# Patient Record
Sex: Male | Born: 1940 | Race: White | Hispanic: No | Marital: Married | Smoking: Former smoker
Health system: Southern US, Community
[De-identification: ages and names within clinical notes are randomized; demographics above are authoritative.]

## PROBLEM LIST (undated history)

## (undated) DIAGNOSIS — Z950 Presence of cardiac pacemaker: Secondary | ICD-10-CM

## (undated) DIAGNOSIS — K449 Diaphragmatic hernia without obstruction or gangrene: Secondary | ICD-10-CM

## (undated) DIAGNOSIS — K219 Gastro-esophageal reflux disease without esophagitis: Secondary | ICD-10-CM

## (undated) DIAGNOSIS — G43909 Migraine, unspecified, not intractable, without status migrainosus: Secondary | ICD-10-CM

## (undated) DIAGNOSIS — R55 Syncope and collapse: Secondary | ICD-10-CM

## (undated) DIAGNOSIS — K5790 Diverticulosis of intestine, part unspecified, without perforation or abscess without bleeding: Secondary | ICD-10-CM

## (undated) DIAGNOSIS — K222 Esophageal obstruction: Secondary | ICD-10-CM

## (undated) DIAGNOSIS — E162 Hypoglycemia, unspecified: Secondary | ICD-10-CM

## (undated) DIAGNOSIS — E781 Pure hyperglyceridemia: Secondary | ICD-10-CM

## (undated) DIAGNOSIS — N2889 Other specified disorders of kidney and ureter: Secondary | ICD-10-CM

## (undated) DIAGNOSIS — I480 Paroxysmal atrial fibrillation: Secondary | ICD-10-CM

## (undated) DIAGNOSIS — R519 Headache, unspecified: Secondary | ICD-10-CM

## (undated) DIAGNOSIS — E669 Obesity, unspecified: Secondary | ICD-10-CM

## (undated) DIAGNOSIS — R51 Headache: Secondary | ICD-10-CM

## (undated) DIAGNOSIS — R6510 Systemic inflammatory response syndrome (SIRS) of non-infectious origin without acute organ dysfunction: Secondary | ICD-10-CM

## (undated) HISTORY — DX: Obesity, unspecified: E66.9

## (undated) HISTORY — DX: Hypoglycemia, unspecified: E16.2

## (undated) HISTORY — DX: Esophageal obstruction: K22.2

## (undated) HISTORY — DX: Diverticulosis of intestine, part unspecified, without perforation or abscess without bleeding: K57.90

## (undated) HISTORY — DX: Paroxysmal atrial fibrillation: I48.0

## (undated) HISTORY — DX: Syncope and collapse: R55

## (undated) HISTORY — DX: Headache, unspecified: R51.9

## (undated) HISTORY — PX: TONSILLECTOMY: SHX5217

## (undated) HISTORY — PX: INSERT / REPLACE / REMOVE PACEMAKER: SUR710

## (undated) HISTORY — PX: TONSILLECTOMY: SUR1361

## (undated) HISTORY — DX: Gastro-esophageal reflux disease without esophagitis: K21.9

## (undated) HISTORY — DX: Pure hyperglyceridemia: E78.1

## (undated) HISTORY — DX: Migraine, unspecified, not intractable, without status migrainosus: G43.909

## (undated) HISTORY — DX: Headache: R51

## (undated) HISTORY — PX: WISDOM TOOTH EXTRACTION: SHX21

## (undated) HISTORY — DX: Diaphragmatic hernia without obstruction or gangrene: K44.9

## (undated) HISTORY — PX: NASAL SEPTUM SURGERY: SHX37

## (undated) HISTORY — PX: SKIN BIOPSY: SHX1

## (undated) HISTORY — PX: HERNIA REPAIR: SHX51

---
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
## 1991-03-02 HISTORY — PX: UMBILICAL HERNIA REPAIR: SHX196

## 1993-02-06 DIAGNOSIS — K222 Esophageal obstruction: Secondary | ICD-10-CM

## 1997-10-28 ENCOUNTER — Ambulatory Visit (HOSPITAL_COMMUNITY): Admission: RE | Admit: 1997-10-28 | Discharge: 1997-10-28 | Payer: Self-pay | Admitting: Gastroenterology

## 1997-12-02 ENCOUNTER — Encounter: Payer: Self-pay | Admitting: Gastroenterology

## 1997-12-02 ENCOUNTER — Other Ambulatory Visit: Admission: RE | Admit: 1997-12-02 | Discharge: 1997-12-02 | Payer: Self-pay | Admitting: Gastroenterology

## 1997-12-02 DIAGNOSIS — K449 Diaphragmatic hernia without obstruction or gangrene: Secondary | ICD-10-CM | POA: Insufficient documentation

## 2004-10-21 ENCOUNTER — Ambulatory Visit: Payer: Self-pay | Admitting: Gastroenterology

## 2005-09-13 ENCOUNTER — Observation Stay (HOSPITAL_COMMUNITY): Admission: EM | Admit: 2005-09-13 | Discharge: 2005-09-14 | Payer: Self-pay | Admitting: Emergency Medicine

## 2006-02-03 ENCOUNTER — Ambulatory Visit: Payer: Self-pay | Admitting: Gastroenterology

## 2006-02-11 ENCOUNTER — Ambulatory Visit: Payer: Self-pay | Admitting: Gastroenterology

## 2006-02-11 DIAGNOSIS — K5730 Diverticulosis of large intestine without perforation or abscess without bleeding: Secondary | ICD-10-CM | POA: Insufficient documentation

## 2006-02-11 LAB — HM COLONOSCOPY: HM Colonoscopy: NORMAL

## 2006-11-15 ENCOUNTER — Ambulatory Visit: Payer: Self-pay | Admitting: Gastroenterology

## 2007-05-18 DIAGNOSIS — K219 Gastro-esophageal reflux disease without esophagitis: Secondary | ICD-10-CM | POA: Insufficient documentation

## 2007-11-22 DIAGNOSIS — E669 Obesity, unspecified: Secondary | ICD-10-CM

## 2007-11-22 DIAGNOSIS — E781 Pure hyperglyceridemia: Secondary | ICD-10-CM | POA: Insufficient documentation

## 2007-11-22 DIAGNOSIS — R519 Headache, unspecified: Secondary | ICD-10-CM | POA: Insufficient documentation

## 2007-11-22 DIAGNOSIS — K649 Unspecified hemorrhoids: Secondary | ICD-10-CM | POA: Insufficient documentation

## 2007-11-22 DIAGNOSIS — R51 Headache: Secondary | ICD-10-CM

## 2007-11-22 HISTORY — DX: Pure hyperglyceridemia: E78.1

## 2007-11-23 ENCOUNTER — Ambulatory Visit: Payer: Self-pay | Admitting: Gastroenterology

## 2007-11-23 LAB — CONVERTED CEMR LAB
ALT: 32 units/L (ref 0–53)
Basophils Absolute: 0 10*3/uL (ref 0.0–0.1)
CO2: 28 meq/L (ref 19–32)
Calcium: 9.2 mg/dL (ref 8.4–10.5)
Creatinine, Ser: 0.7 mg/dL (ref 0.4–1.5)
Eosinophils Relative: 2.3 % (ref 0.0–5.0)
Ferritin: 31.6 ng/mL (ref 22.0–322.0)
Folate: 11.9 ng/mL
GFR calc Af Amer: 146 mL/min
HCT: 43.8 % (ref 39.0–52.0)
MCV: 96.5 fL (ref 78.0–100.0)
Monocytes Absolute: 0.6 10*3/uL (ref 0.1–1.0)
Neutro Abs: 4.9 10*3/uL (ref 1.4–7.7)
Potassium: 4 meq/L (ref 3.5–5.1)
RBC: 4.53 M/uL (ref 4.22–5.81)
Saturation Ratios: 28.9 % (ref 20.0–50.0)
Vitamin B-12: 331 pg/mL (ref 211–911)
WBC: 7.1 10*3/uL (ref 4.5–10.5)

## 2008-03-06 ENCOUNTER — Telehealth: Payer: Self-pay | Admitting: Gastroenterology

## 2008-03-07 ENCOUNTER — Telehealth: Payer: Self-pay | Admitting: Gastroenterology

## 2008-12-10 ENCOUNTER — Ambulatory Visit: Payer: Self-pay | Admitting: Gastroenterology

## 2009-05-28 ENCOUNTER — Telehealth: Payer: Self-pay | Admitting: Gastroenterology

## 2009-11-18 ENCOUNTER — Ambulatory Visit: Payer: Self-pay | Admitting: Gastroenterology

## 2009-11-18 DIAGNOSIS — R490 Dysphonia: Secondary | ICD-10-CM

## 2009-11-18 LAB — CONVERTED CEMR LAB
ALT: 31 units/L (ref 0–53)
Albumin: 4.1 g/dL (ref 3.5–5.2)
Alkaline Phosphatase: 65 units/L (ref 39–117)
Basophils Absolute: 0 10*3/uL (ref 0.0–0.1)
Bilirubin, Direct: 0.1 mg/dL (ref 0.0–0.3)
Chloride: 103 meq/L (ref 96–112)
Eosinophils Absolute: 0.1 10*3/uL (ref 0.0–0.7)
Ferritin: 93.4 ng/mL (ref 22.0–322.0)
Folate: 17.9 ng/mL
Iron: 107 ug/dL (ref 42–165)
Lymphocytes Relative: 20.6 % (ref 12.0–46.0)
Lymphs Abs: 1.7 10*3/uL (ref 0.7–4.0)
MCHC: 35 g/dL (ref 30.0–36.0)
MCV: 98.6 fL (ref 78.0–100.0)
Monocytes Relative: 7.3 % (ref 3.0–12.0)
Neutro Abs: 6 10*3/uL (ref 1.4–7.7)
Neutrophils Relative %: 70.6 % (ref 43.0–77.0)
Platelets: 226 10*3/uL (ref 150.0–400.0)
RBC: 4.37 M/uL (ref 4.22–5.81)
TSH: 1.87 microintl units/mL (ref 0.35–5.50)
Total Bilirubin: 0.4 mg/dL (ref 0.3–1.2)
Transferrin: 231.6 mg/dL (ref 212.0–360.0)
Vitamin B-12: 450 pg/mL (ref 211–911)

## 2009-11-27 ENCOUNTER — Ambulatory Visit: Payer: Self-pay | Admitting: Internal Medicine

## 2009-12-01 ENCOUNTER — Telehealth (INDEPENDENT_AMBULATORY_CARE_PROVIDER_SITE_OTHER): Payer: Self-pay

## 2010-01-19 ENCOUNTER — Ambulatory Visit: Payer: Self-pay | Admitting: Internal Medicine

## 2010-01-19 DIAGNOSIS — N400 Enlarged prostate without lower urinary tract symptoms: Secondary | ICD-10-CM

## 2010-01-19 HISTORY — DX: Benign prostatic hyperplasia without lower urinary tract symptoms: N40.0

## 2010-01-19 LAB — CONVERTED CEMR LAB
AST: 21 units/L (ref 0–37)
Basophils Relative: 0.4 % (ref 0.0–3.0)
Bilirubin, Direct: 0.1 mg/dL (ref 0.0–0.3)
Calcium: 9 mg/dL (ref 8.4–10.5)
Chloride: 106 meq/L (ref 96–112)
Cholesterol, target level: 200 mg/dL
Cholesterol: 171 mg/dL (ref 0–200)
Direct LDL: 78.7 mg/dL
Eosinophils Relative: 2.6 % (ref 0.0–5.0)
Glucose, Bld: 91 mg/dL (ref 70–99)
HCT: 44.2 % (ref 39.0–52.0)
HDL goal, serum: 40 mg/dL
Hemoglobin: 15.7 g/dL (ref 13.0–17.0)
Leukocytes, UA: NEGATIVE
Lymphocytes Relative: 22.2 % (ref 12.0–46.0)
Lymphs Abs: 1.4 10*3/uL (ref 0.7–4.0)
MCHC: 35.5 g/dL (ref 30.0–36.0)
Nitrite: NEGATIVE
PSA: 0.19 ng/mL (ref 0.10–4.00)
Potassium: 3.9 meq/L (ref 3.5–5.1)
Sodium: 141 meq/L (ref 135–145)
Total CHOL/HDL Ratio: 6
Total Protein, Urine: NEGATIVE mg/dL
Urine Glucose: NEGATIVE mg/dL
Urobilinogen, UA: 0.2 (ref 0.0–1.0)
VLDL: 63.2 mg/dL — ABNORMAL HIGH (ref 0.0–40.0)
pH: 6 (ref 5.0–8.0)

## 2010-01-20 ENCOUNTER — Encounter: Payer: Self-pay | Admitting: Internal Medicine

## 2010-03-31 NOTE — Assessment & Plan Note (Signed)
Summary: NEW/ MEDICARE /[place]  #   Vital Signs:  Patient profile:   70 year old male Height:      68 inches Weight:      212 pounds BMI:     32.35 O2 Sat:      96 % on Room air Temp:     97.6 degrees F oral Pulse rate:   78 / minute Pulse rhythm:   regular Resp:     16 per minute BP sitting:   106 / 70  (left arm) Cuff size:   large  Vitals Entered By: Rock Nephew CMA (November 27, 2009 3:27 PM)  Nutrition Counseling: Patient's BMI is greater than 25 and therefore counseled on weight management options.  O2 Flow:  Room air  Primary Care Kabella Cassidy:  [name] MD   History of Present Illness: New to me he needs to establish with a new PCP. He has no complaints today and he says that he does not want to do a complete physical today.  Dyspepsia History:      He has no alarm features of dyspepsia including no history of melena, hematochezia, dysphagia, persistent vomiting, or involuntary weight loss > 5%.  There is a prior history of GERD.     Preventive Screening-Counseling & Management  Alcohol-Tobacco     Alcohol drinks/day: 0     Smoking Status: never     Tobacco Counseling: not indicated; no tobacco use  Hep-HIV-STD-Contraception     Hepatitis Risk: no risk noted     HIV Risk: no risk noted     STD Risk: no risk noted      Sexual History:  currently monogamous.        Drug Use:  never.        Blood Transfusions:  no.    Clinical Review Panels:  Immunizations   Last Tetanus Booster:  Historical (03/01/2006)  Diabetes Management   Creatinine:  0.8 (11/18/2009)  CBC   WBC:  8.4 (11/18/2009)   RBC:  4.37 (11/18/2009)   Hgb:  15.1 (11/18/2009)   Hct:  43.1 (11/18/2009)   Platelets:  226.0 (11/18/2009)   MCV  98.6 (11/18/2009)   MCHC  35.0 (11/18/2009)   RDW  13.8 (11/18/2009)   PMN:  70.6 (11/18/2009)   Lymphs:  20.6 (11/18/2009)   Monos:  7.3 (11/18/2009)   Eosinophils:  1.2 (11/18/2009)   Basophil:  0.3 (11/18/2009)  Complete Metabolic  Panel   Glucose:  104 (11/18/2009)   Sodium:  141 (11/18/2009)   Potassium:  4.2 (11/18/2009)   Chloride:  103 (11/18/2009)   CO2:  27 (11/18/2009)   BUN:  11 (11/18/2009)   Creatinine:  0.8 (11/18/2009)   Albumin:  4.1 (11/18/2009)   Total Protein:  6.8 (11/18/2009)   Calcium:  9.5 (11/18/2009)   Total Bili:  0.4 (11/18/2009)   Alk Phos:  65 (11/18/2009)   SGPT (ALT):  31 (11/18/2009)   SGOT (AST):  26 (11/18/2009)   Medications Prior to Update: 1)  Naproxen Sodium 550 Mg Tabs (Naproxen Sodium) .... Take 1 Tablet By Mouth Once A Day As Needed 2)  Metoclopramide Hcl 10 Mg Tabs (Metoclopramide Hcl) .... Take 1 Tablet By Mouth Once A Day As Needed 3)  Multivitamins  Tabs (Multiple Vitamin) .... Take 1 Tablet By Mouth Once A Day 4)  Fish Oil 1000 Mg Caps (Omega-3 Fatty Acids) .... Take 1 Tablet By Mouth Once A Day 5)  Nexium 40 Mg Cpdr (Esomeprazole Magnesium) .... Take  1 Tab 30 Minutes Before Meals Daily 6)  Nasal Spray 30dhe .... As Needed 7)  Dexilant 60 Mg Cpdr (Dexlansoprazole) .... Take One By Mouth Every Morning. 8)  Domperidone 10mg  .... Take One By Mouth At Bedtime  Current Medications (verified): 1)  Naproxen Sodium 550 Mg Tabs (Naproxen Sodium) .... Take 1 Tablet By Mouth Once A Day As Needed 2)  Metoclopramide Hcl 10 Mg Tabs (Metoclopramide Hcl) .... Take 1 Tablet By Mouth Once A Day As Needed 3)  Multivitamins  Tabs (Multiple Vitamin) .... Take 1 Tablet By Mouth Once A Day 4)  Fish Oil 1000 Mg Caps (Omega-3 Fatty Acids) .... Take 1 Tablet By Mouth Once A Day 5)  Nexium 40 Mg Cpdr (Esomeprazole Magnesium) .... Take 1 Tab 30 Minutes Before Meals Daily 6)  Nasal Spray 30dhe .... As Needed 7)  Dexilant 60 Mg Cpdr (Dexlansoprazole) .... Take One By Mouth Every Morning. 8)  Domperidone 10mg  .... 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  Alcohol Use - no Occupation: Retired  Daily Caffeine Use  Risk Factors: Alcohol Use: 0 (11/27/2009)  Risk Factors: Smoking Status: never (11/27/2009)  Family History: Reviewed history from 11/23/2007 and no changes required. No FH of Colon Cancer:  Social History: Reviewed history from 11/18/2009 and no changes required. Patient is a former smoker.  Alcohol Use - no Occupation: Retired  Daily Caffeine Use Smoking Status:  never Hepatitis Risk:  no risk noted HIV Risk:  no risk noted STD Risk:  no risk noted Sexual History:  currently monogamous Drug Use:  never Blood Transfusions:  no  Review of Systems       The patient complains of weight gain.  The patient denies anorexia, fever, weight loss, chest pain, syncope, dyspnea on exertion, peripheral edema, prolonged cough, headaches, hemoptysis, abdominal pain, hematuria, difficulty walking, depression, enlarged lymph nodes, and angioedema.    Physical Exam  General:  Well developed, well nourished, no acute distress. Head:  Normocephalic and atraumatic. Mouth:  No deformity or lesions, dentition normal. Neck:  Supple; no masses or thyromegaly. Lungs:  normal respiratory effort, no intercostal retractions, no accessory muscle use, normal breath sounds, no dullness, no fremitus, no crackles, and no wheezes.   Heart:  normal rate, regular rhythm, no murmur, no gallop, no rub,  and no JVD.   Abdomen:  soft, non-tender, normal bowel sounds, no distention, no masses, no guarding, no rigidity, no rebound tenderness, no abdominal hernia, no inguinal hernia, no hepatomegaly, and no splenomegaly.   Msk:  No deformity or scoliosis noted of thoracic or lumbar spine.   Pulses:  R and L carotid,radial,femoral,dorsalis pedis and posterior tibial pulses are full and equal bilaterally Extremities:  No clubbing, cyanosis, edema, or deformity noted with normal full range of motion of all joints.   Neurologic:  No cranial nerve deficits noted. Station and gait are normal. Plantar reflexes are down-going bilaterally. DTRs are symmetrical throughout. Sensory, motor and coordinative functions appear intact. Skin:  Intact without suspicious lesions  or rashes Cervical Nodes:  No lymphadenopathy noted Psych:  Cognition and judgment appear intact. Alert and cooperative with normal attention span and concentration. No apparent delusions, illusions, hallucinations   Impression & Recommendations:  Problem # 1:  GASTROESOPHAGEAL REFLUX DISEASE (ICD-530.81) Assessment Unchanged  His updated medication list for this problem includes:    Nexium 40 Mg Cpdr (Esomeprazole magnesium) .Marland Kitchen... Take 1 tab 30 minutes before meals daily    Dexilant 60 Mg Cpdr (Dexlansoprazole) .Marland Kitchen... Take one by mouth every morning.  Complete Medication List: 1)  Naproxen Sodium 550 Mg Tabs (Naproxen sodium) .... Take 1 tablet by mouth once a day as needed 2)  Metoclopramide Hcl 10 Mg Tabs (Metoclopramide hcl) .... Take 1 tablet by mouth once a day as needed 3)  Multivitamins Tabs (Multiple vitamin) .... Take 1 tablet by mouth once a day 4)  Fish Oil 1000 Mg Caps (Omega-3 fatty acids) .... Take 1 tablet by mouth once a day 5)  Nexium 40 Mg Cpdr (Esomeprazole magnesium) .... Take 1 tab 30 minutes before meals daily 6)  Nasal Spray 30dhe  .... As needed 7)  Dexilant 60 Mg Cpdr (Dexlansoprazole) .... Take one by mouth every  morning. 8)  Domperidone 10mg   .... Take one by mouth at bedtime  Patient Instructions: 1)  Please schedule a follow-up appointment in 2 months. 2)  It is important that you exercise regularly at least 20 minutes 5 times a week. If you develop chest pain, have severe difficulty breathing, or feel very tired , stop exercising immediately and seek medical attention. 3)  You need to lose weight. Consider a lower calorie diet and regular exercise.   Preventive Care Screening  Last Tetanus Booster:    Date:  03/01/2006    Results:  Historical

## 2010-03-31 NOTE — Progress Notes (Signed)
  Phone Note Other Incoming   Request: Send information Summary of Call: Records received from [facility]. Number of pages were 32 and sent up to Dr. Yetta Barre.

## 2010-03-31 NOTE — Assessment & Plan Note (Signed)
Summary: yearly medicare physical-lb   Vital Signs:  Patient profile:   70 year old male Height:      68 inches Weight:      216.50 pounds BMI:     33.04 O2 Sat:      98 % on Room air Temp:     98.1 degrees F oral Pulse rate:   52 / minute Pulse rhythm:   regular Resp:     16 per minute BP sitting:   120 / 68  (left arm) Cuff size:   large  Vitals Entered By: Rock Nephew CMA (January 19, 2010 9:01 AM)  Nutrition Counseling: Patient's BMI is greater than 25 and therefore counseled on weight management options.  O2 Flow:  Room air  Primary Care Provider:  [name] MD   History of Present Illness: Here for Medicare AWV:  1.   Risk factors based on Past M, S, F history: yes, done 2.   Physical Activities: very active 3.   Depression/mood: mood is very good 4.   Hearing: hears voice at 3 feet with eharing aids 5.   ADL's: independent and complete 6.   Fall Risk: none noted 7.   Home Safety: safe 8.   Height, weight, &visual acuity:yes 9.   Counseling: yes 10.   Labs ordered based on risk factors: done 11.           Referral Coordination: none needed 12.           Care Plan: done 13.            Cognitive Assessment : very sharp, responds to all questions appropriately  Lipid Management History:      Positive NCEP/ATP III risk factors include male age 18 years old or older.  Negative NCEP/ATP III risk factors include non-diabetic, no family history for ischemic heart disease, non-tobacco-user status, non-hypertensive, no ASHD (atherosclerotic heart disease), no prior stroke/TIA, no peripheral vascular disease, and no history of aortic aneurysm.        The patient states that he knows about the "Therapeutic Lifestyle Change" diet.  His compliance with the TLC diet is not at all.  The patient expresses understanding of adjunctive measures for cholesterol lowering.  Adjunctive measures started by the patient include fiber and limit alcohol consumpton.  He expresses no  side effects from his lipid-lowering medication.  The patient denies any symptoms to suggest myopathy or liver disease.    Preventive Screening-Counseling & Management  Alcohol-Tobacco     Alcohol drinks/day: 0     Alcohol Counseling: not indicated; patient does not drink     Smoking Status: never     Year Quit: 30 years ago     Tobacco Counseling: not indicated; no tobacco use  Hep-HIV-STD-Contraception     Hepatitis Risk: no risk noted     HIV Risk: no risk noted     STD Risk: no risk noted      Sexual History:  currently monogamous.        Drug Use:  never.        Blood Transfusions:  no.    Clinical Review Panels:  Prevention   Last Colonoscopy:  Normal (02/11/2006)  Immunizations   Last Tetanus Booster:  Historical (03/01/2006)   Last Flu Vaccine:  Fluvax MCR (01/19/2010)  Diabetes Management   Creatinine:  0.8 (11/18/2009)   Last Flu Vaccine:  Fluvax MCR (01/19/2010)  CBC   WBC:  8.4 (11/18/2009)   RBC:  4.37 (11/18/2009)  Hgb:  15.1 (11/18/2009)   Hct:  43.1 (11/18/2009)   Platelets:  226.0 (11/18/2009)   MCV  98.6 (11/18/2009)   MCHC  35.0 (11/18/2009)   RDW  13.8 (11/18/2009)   PMN:  70.6 (11/18/2009)   Lymphs:  20.6 (11/18/2009)   Monos:  7.3 (11/18/2009)   Eosinophils:  1.2 (11/18/2009)   Basophil:  0.3 (11/18/2009)  Complete Metabolic Panel   Glucose:  104 (11/18/2009)   Sodium:  141 (11/18/2009)   Potassium:  4.2 (11/18/2009)   Chloride:  103 (11/18/2009)   CO2:  27 (11/18/2009)   BUN:  11 (11/18/2009)   Creatinine:  0.8 (11/18/2009)   Albumin:  4.1 (11/18/2009)   Total Protein:  6.8 (11/18/2009)   Calcium:  9.5 (11/18/2009)   Total Bili:  0.4 (11/18/2009)   Alk Phos:  65 (11/18/2009)   SGPT (ALT):  31 (11/18/2009)   SGOT (AST):  26 (11/18/2009)   Medications Prior to Update: 1)  Naproxen Sodium 550 Mg Tabs (Naproxen Sodium) .... Take 1 Tablet By Mouth Once A Day As Needed 2)  Metoclopramide Hcl 10 Mg Tabs (Metoclopramide Hcl) ....  Take 1 Tablet By Mouth Once A Day As Needed 3)  Multivitamins  Tabs (Multiple Vitamin) .... Take 1 Tablet By Mouth Once A Day 4)  Fish Oil 1000 Mg Caps (Omega-3 Fatty Acids) .... Take 1 Tablet By Mouth Once A Day 5)  Nexium 40 Mg Cpdr (Esomeprazole Magnesium) .... Take 1 Tab 30 Minutes Before Meals Daily 6)  Nasal Spray 30dhe .... As Needed 7)  Dexilant 60 Mg Cpdr (Dexlansoprazole) .... Take One By Mouth Every Morning. 8)  Domperidone 10mg  .... Take One By Mouth At Bedtime  Current Medications (verified): 1)  Naproxen Sodium 550 Mg Tabs (Naproxen Sodium) .... Take 1 Tablet By Mouth Once A Day As Needed 2)  Metoclopramide Hcl 10 Mg Tabs (Metoclopramide Hcl) .... Take 1 Tablet By Mouth Once A Day As Needed 3)  Multivitamins  Tabs (Multiple Vitamin) .... Take 1 Tablet By Mouth Once A Day 4)  Fish Oil 1000 Mg Caps (Omega-3 Fatty Acids) .... Take 1 Tablet By Mouth Once A Day 5)  Nexium 40 Mg Cpdr (Esomeprazole Magnesium) .... Take 1 Tab 30 Minutes Before Meals Daily 6)  Nasal Spray 30dhe .... As Needed 7)  Dexilant 60 Mg Cpdr (Dexlansoprazole) .... Take One By Mouth Every Morning. 8)  Domperidone 10mg  .... 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  Alcohol Use - no Occupation: Retired  Daily Caffeine  Use  Risk Factors: Alcohol Use: 0 (01/19/2010)  Risk Factors: Smoking Status: never (01/19/2010)  Family History: Reviewed history from 11/23/2007 and no changes required. No FH of Colon Cancer:  Social History: Reviewed history from 11/18/2009 and no changes required. Patient is a former smoker.  Alcohol Use - no Occupation: Retired  Daily Caffeine Use  Review of Systems       The patient complains of weight gain and severe indigestion/heartburn.  The patient denies  anorexia, fever, weight loss, chest pain, syncope, dyspnea on exertion, peripheral edema, prolonged cough, headaches, hemoptysis, abdominal pain, melena, hematochezia, hematuria, muscle weakness, suspicious skin lesions, difficulty walking, depression, unusual weight change, abnormal bleeding, enlarged lymph nodes, angioedema, and testicular masses.   GU:  Denies decreased libido, discharge, dysuria, erectile dysfunction, genital sores, hematuria, incontinence, nocturia, urinary frequency, and urinary hesitancy.  Physical Exam  General:  alert, well-developed, well-nourished, well-hydrated, appropriate dress, normal appearance, healthy-appearing, cooperative to examination, good hygiene, and overweight-appearing.   Head:  normocephalic, atraumatic, no abnormalities observed, and no abnormalities palpated.   Eyes:  vision grossly intact, pupils equal, pupils round, and pupils reactive to light.   Mouth:  Oral mucosa and oropharynx without lesions or exudates.  Teeth in good repair. Neck:  supple, full ROM, no masses, no thyromegaly, no thyroid nodules or tenderness, no JVD, normal carotid upstroke, no carotid bruits, no cervical lymphadenopathy, and no neck tenderness.   Chest Wall:  No deformities, masses, tenderness or gynecomastia noted. Breasts:  No masses or gynecomastia noted Lungs:  normal respiratory effort, no intercostal retractions, no accessory muscle use, normal breath sounds, no dullness, no fremitus, no  crackles, and no wheezes.   Heart:  normal rate, regular rhythm, no murmur, no gallop, no rub, and no JVD.   Abdomen:  soft, non-tender, normal bowel sounds, no distention, no masses, no guarding, no rigidity, no rebound tenderness, no abdominal hernia, no inguinal hernia, no hepatomegaly, and no splenomegaly.   Rectal:  No external abnormalities noted. Normal sphincter tone. No rectal masses or tenderness. Genitalia:  circumcised, no hydrocele, no varicocele, no scrotal masses, no testicular masses or atrophy, no cutaneous lesions, and no urethral discharge.   Prostate:  no nodules, no asymmetry, no induration, and 1+ enlarged.   Msk:  normal ROM, no joint tenderness, no joint swelling, no joint warmth, no redness over joints, no joint deformities, no joint instability, no crepitation, and no muscle atrophy.   Pulses:  R and L carotid,radial,femoral,dorsalis pedis and posterior tibial pulses are full and equal bilaterally Extremities:  No clubbing, cyanosis, edema, or deformity noted with normal full range of motion of all joints.   Neurologic:  No cranial nerve deficits noted. Station and gait are normal. Plantar reflexes are down-going bilaterally. DTRs are symmetrical throughout. Sensory, motor and coordinative functions appear intact. Skin:  turgor normal, color normal, no rashes, no suspicious lesions, no ecchymoses, no petechiae, no purpura, no ulcerations, and no edema.   Cervical Nodes:  no anterior cervical adenopathy and no posterior cervical adenopathy.   Axillary Nodes:  no R axillary adenopathy and no L axillary adenopathy.   Inguinal Nodes:  no R inguinal adenopathy and no L inguinal adenopathy.   Psych:  Oriented X3, memory intact for recent and remote, normally interactive, good eye contact, not anxious appearing, not depressed appearing, not agitated, and not suicidal.     Impression & Recommendations:  Problem # 1:  HYPERTROPHY PROSTATE W/UR OBST & OTH LUTS  (ICD-600.01) Assessment New  Orders: Venipuncture (16109) TLB-BMP (Basic Metabolic Panel-BMET) (80048-METABOL) TLB-CBC Platelet - w/Differential (85025-CBCD) TLB-Hepatic/Liver Function Pnl (80076-HEPATIC) TLB-TSH (Thyroid Stimulating Hormone) (84443-TSH) TLB-Lipid Panel (80061-LIPID) TLB-Udip w/ Micro (81001-URINE) TLB-PSA (Prostate Specific Antigen) (84153-PSA) DRE (G0102) Hemoccult Guaiac-1 spec.(in office) (82270)  Problem # 2:  ROUTINE GENERAL MEDICAL EXAM@HEALTH  CARE FACL (ICD-V70.0) Assessment: New  Colonoscopy: Normal (02/11/2006) Td Booster: Historical (03/01/2006)   Flu Vax: Fluvax MCR (01/19/2010)   TSH: 1.87 (11/18/2009)    Discussed using sunscreen, use of alcohol, drug  use, self testicular exam, routine dental care, routine eye care, routine physical exam, seat belts, multiple vitamins, and recommendations for immunizations.  Discussed exercise and checking cholesterol.   Also recommend checking PSA.  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) Assessment: Unchanged  Orders: Venipuncture (60454) TLB-BMP (Basic Metabolic Panel-BMET) (80048-METABOL) TLB-CBC Platelet - w/Differential (85025-CBCD) TLB-Hepatic/Liver Function Pnl (80076-HEPATIC) TLB-TSH (Thyroid Stimulating Hormone) (84443-TSH) TLB-Lipid Panel (80061-LIPID) TLB-Udip w/ Micro (81001-URINE) TLB-PSA (Prostate Specific Antigen) (84153-PSA)  Labs Reviewed: SGOT: 26 (11/18/2009)   SGPT: 31 (11/18/2009)  Problem # 4:  OBESITY (ICD-278.00) Assessment: Unchanged  Orders: Venipuncture (09811) TLB-BMP (Basic Metabolic Panel-BMET) (80048-METABOL) TLB-CBC Platelet - w/Differential (85025-CBCD) TLB-Hepatic/Liver Function Pnl (80076-HEPATIC) TLB-TSH (Thyroid Stimulating Hormone) (84443-TSH) TLB-Lipid Panel (80061-LIPID) TLB-Udip w/ Micro (81001-URINE) TLB-PSA (Prostate Specific Antigen) (84153-PSA)  Ht: 68 (01/19/2010)   Wt: 216.50 (01/19/2010)    BMI: 33.04 (01/19/2010)  Complete Medication List: 1)  Naproxen Sodium 550 Mg Tabs (Naproxen sodium) .... Take 1 tablet by mouth once a day as needed 2)  Metoclopramide Hcl 10 Mg Tabs (Metoclopramide hcl) .... Take 1 tablet by mouth once a day as needed 3)  Multivitamins Tabs (Multiple vitamin) .... Take 1 tablet by mouth once a day 4)  Fish Oil 1000 Mg Caps (Omega-3 fatty acids) .... Take 1 tablet by mouth once a day 5)  Nexium 40 Mg Cpdr (Esomeprazole magnesium) .... Take 1 tab 30 minutes before meals daily 6)  Nasal Spray 30dhe  .... As needed 7)  Dexilant 60 Mg Cpdr (Dexlansoprazole) .... Take one by mouth every morning. 8)  Domperidone 10mg   .... Take one by mouth at bedtime 9)  C-erogtamine 1mg    Other Orders: Administration Flu vaccine - MCR (G0008) Flu Vaccine 38yrs + MEDICARE PATIENTS (B1478)  Lipid Assessment/Plan:      Based on NCEP/ATP III, the patient's risk factor category is "0-1 risk factors".  The patient's lipid goals are as follows: Total cholesterol goal is 200; LDL cholesterol goal is 160; HDL cholesterol goal is 40; Triglyceride goal is 150.    Colorectal Screening:  Current Recommendations:    Hemoccult: NEG X 1 today  Colonoscopy Results:    Date of Exam: 02/11/2006    Results: Normal  PSA Screening:    Reviewed PSA screening recommendations: PSA ordered  Immunization & Chemoprophylaxis:    Tetanus vaccine: Historical  (03/01/2006)    Influenza vaccine: Fluvax MCR  (01/19/2010)   Patient Instructions: 1)  Please schedule a follow-up appointment in 4 months. 2)  It is important that you exercise regularly at least 20 minutes 5 times a week. If you develop chest pain, have severe difficulty breathing, or feel very tired , stop exercising immediately and seek medical attention. 3)  You need to lose weight. Consider a lower calorie diet and regular exercise.    Orders Added: 1)  Venipuncture [36415] 2)  TLB-BMP (Basic Metabolic Panel-BMET)  [80048-METABOL] 3)  TLB-CBC Platelet - w/Differential [85025-CBCD] 4)  TLB-Hepatic/Liver Function Pnl [80076-HEPATIC] 5)  TLB-TSH (Thyroid Stimulating Hormone) [84443-TSH] 6)  TLB-Lipid Panel [80061-LIPID] 7)  TLB-Udip w/ Micro [81001-URINE] 8)  TLB-PSA (Prostate Specific Antigen) [84153-PSA] 9)  Administration Flu vaccine - MCR [G0008] 10)  Flu Vaccine 47yrs + MEDICARE PATIENTS [Q2039] 11)  [facility] -Subsequent Annual Wellness Visit [G0439] 12)  Est. Patient Level III [29562] 13)  DRE [G0102] 14)  Hemoccult Guaiac-1 spec.(in office) [82270]   Immunizations Administered:  Influenza Vaccine # 1:    Vaccine Type: Fluvax MCR  Site: right deltoid    Mfr: Sanofi Pasteur    Dose: 0.5 ml    Route: IM    Given by: Rock Nephew CMA    Exp. Date: 08/29/2010    Lot #: [id number]    VIS given: 09/23/09 version given January 19, 2010.   Immunizations Administered:  Influenza Vaccine # 1:    Vaccine Type: Fluvax MCR    Site: right deltoid    Mfr: Sanofi Pasteur    Dose: 0.5 ml    Route: IM    Given by: Rock Nephew CMA    Exp. Date: 08/29/2010    Lot #: [id number]    VIS given: 09/23/09 version given January 19, 2010.  Prevention & Chronic Care Immunizations   Influenza vaccine: Fluvax MCR  (01/19/2010)    Tetanus booster: 03/01/2006: Historical    Pneumococcal vaccine: Not documented   Pneumococcal vaccine deferral: Refused  (01/19/2010)    H. zoster vaccine: Not documented   H. zoster vaccine deferral: Refused  (01/19/2010)  Colorectal Screening   Hemoccult: Not documented   Hemoccult action/deferral: NEG X 1 today  (01/19/2010)    Colonoscopy: Normal  (02/11/2006)  Other Screening   PSA: Not documented   PSA ordered.   PSA action/deferral: PSA ordered  (01/19/2010)   Smoking status: never  (01/19/2010)  Lipids   Total Cholesterol: Not documented   LDL: Not documented   LDL Direct: Not documented   HDL: Not documented   Triglycerides: Not documented    SGOT  (AST): 26  (11/18/2009)   SGPT (ALT): 31  (11/18/2009)   Alkaline phosphatase: 65  (11/18/2009)   Total bilirubin: 0.4  (11/18/2009)  Self-Management Support :    Lipid self-management support: Not documented    Nursing Instructions: Give Flu vaccine today

## 2010-03-31 NOTE — Progress Notes (Signed)
Summary: ? re rx  Phone Note Call from Patient Call back at Home Phone [phone]   Caller: Patient Call For: Jarold Motto Reason for Call: Talk to Nurse Summary of Call: Patient has questions regarding his rx (Nexium) Initial call taken by: Tawni Levy,  May 28, 2009 9:35 AM  Follow-up for Phone Call        Pt recieved letter stating that Nexium is not covered any longer or it exceeded plan limits.    Called [facility] customer service at [phone].  Represenative states letter sent to pt was a mistake.  Nexium is covered and no prior authorization is needed.  Pt notified.  Follow-up by: Ashok Cordia RN,  May 28, 2009 10:18 AM

## 2010-03-31 NOTE — Letter (Signed)
Summary: Lipid Letter  [facility]  [address] [place], [place] [address]   Phone: [phone]  Fax: [phone]    01/20/2010  Frank [name] [address] [place], [place]  [address]  Dear Frank [name]:  We have carefully reviewed your last lipid profile from  and the results are noted below with a summary of recommendations for lipid management.    Cholesterol:       171     Goal: <200   HDL "good" Cholesterol:   24.40     Goal: >40   LDL "bad" Cholesterol:   79     Goal: <160   Triglycerides:       316.0     Goal: <150 wow        TLC Diet (Therapeutic Lifestyle Change): Saturated Fats & Transfatty acids should be kept < 7% of total calories ***Reduce Saturated Fats Polyunstaurated Fat can be up to 10% of total calories Monounsaturated Fat Fat can be up to 20% of total calories Total Fat should be no greater than 25-35% of total calories Carbohydrates should be 50-60% of total calories Protein should be approximately 15% of total calories Fiber should be at least 20-30 grams a day ***Increased fiber may help lower LDL Total Cholesterol should be < 200mg /day Consider adding plant stanol/sterols to diet (example: Benacol spread) ***A higher intake of unsaturated fat may reduce Triglycerides and Increase HDL    Adjunctive Measures (may lower LIPIDS and reduce risk of Heart Attack) include: Aerobic Exercise (20-30 minutes 3-4 times a week) Limit Alcohol Consumption Weight Reduction Aspirin 75-81 mg a day by mouth (if not allergic or contraindicated) Dietary Fiber 20-30 grams a day by mouth     Current Medications: 1)    Naproxen Sodium 550 Mg Tabs (Naproxen sodium) .... Take 1 tablet by mouth once a day as needed 2)    Metoclopramide Hcl 10 Mg Tabs (Metoclopramide hcl) .... Take 1 tablet by mouth once a day as needed 3)    Multivitamins  Tabs (Multiple vitamin) .... Take 1 tablet by mouth once a day 4)    Fish Oil 1000 Mg Caps (Omega-3 fatty acids) ....  Take 1 tablet by mouth once a day 5)    Nexium 40 Mg Cpdr (Esomeprazole magnesium) .... Take 1 tab 30 minutes before meals daily 6)    Nasal Spray 30dhe  .... As needed 7)    Dexilant 60 Mg Cpdr (Dexlansoprazole) .... Take one by mouth every morning. 8)    Domperidone 10mg   .... Take one by mouth at bedtime 9)    C-erogtamine 1mg    If you have any questions, please call. We appreciate being able to work with you.   Sincerely,    [facility] [name] MD

## 2010-03-31 NOTE — Assessment & Plan Note (Signed)
Summary: 1 YR. F/U//MED REFILL--CH.   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 However, he developed some facial dyskinesia with metoclopramide which is resolved with discontinuation of this medicine despite the fact that it greatly helped his GERD. He currently complains of hoarseness, coughing, and sore throat refractory to Nexium 40 mg a day. He denies other gastrointestinal problems at this time. He is up-to-date on his endoscopy and colonoscopy exams. He desires primary care followup at our office.   GI Review of Systems    Reports acid reflux.      Denies abdominal pain, belching, bloating, chest pain, dysphagia with liquids, dysphagia with solids, heartburn, loss of appetite, nausea, vomiting, vomiting blood, weight loss, and  weight gain.        Denies anal fissure, black tarry stools, change in bowel habit, constipation, diarrhea, diverticulosis, fecal incontinence, heme positive stool, hemorrhoids, irritable bowel syndrome, jaundice, light color stool, liver problems, rectal bleeding, and  rectal pain.    Current Medications (verified): 1)  Naproxen Sodium 550 Mg Tabs (Naproxen Sodium) .... Take 1 Tablet By Mouth Once A Day As Needed 2)  Metoclopramide Hcl 10 Mg Tabs (Metoclopramide Hcl) .... Take 1 Tablet By Mouth Once A Day As Needed 3)  Multivitamins  Tabs (Multiple Vitamin) .... Take 1 Tablet By Mouth Once A Day 4)  Fish Oil 1000 Mg Caps (Omega-3 Fatty Acids) .... Take 1 Tablet By Mouth Once A Day 5)  Nexium 40 Mg Cpdr (Esomeprazole  Magnesium) .... Take 1 Tab 30 Minutes Before Meals Daily 6)  Nasal Spray 30dhe .... As Needed  Allergies (verified): 1)  Codeine  Past History:  Past medical, surgical, family and social histories (including risk factors) reviewed for relevance to current acute and chronic problems.  Past Medical History: Reviewed history from 11/22/2007 and no changes required. Current Problems:  HEADACHE (ICD-784.0) HYPERTRIGLYCERIDEMIA (ICD-272.1) OBESITY (ICD-278.00) HEMORRHOIDS (ICD-455.6) ESOPHAGEAL STRICTURE (ICD-530.3) HIATAL HERNIA (ICD-553.3) DIVERTICULOSIS, COLON (ICD-562.10) GASTROESOPHAGEAL REFLUX DISEASE (ICD-530.81) MYOCARDIAL INFARCTION, HX OF (ICD-412)  Past Surgical History: Reviewed history from 05/18/2007 and no changes required. Umbilical Hernia Repair (1993) CABG (1989) Tonsillectomy  Deviated Septum Repair (1987)  Family History: Reviewed history from 11/23/2007 and no changes required. No FH of Colon Cancer:  Social History: Reviewed history from 12/10/2008 and no changes required. Patient is a former smoker.  Alcohol Use - no Occupation: Retired  Daily Caffeine Use  Review of Systems       The patient complains of headaches-new, hearing problems, and sore throat.  The patient denies allergy/sinus, anemia, anxiety-new, arthritis/joint pain, back pain, blood in urine, breast changes/lumps, change in vision, confusion, cough, coughing up blood, depression-new, fainting, fatigue, fever, heart murmur, heart rhythm changes, itching, menstrual pain, muscle pains/cramps, night sweats, nosebleeds, pregnancy symptoms, shortness of breath, skin rash, sleeping problems, swelling of feet/legs, swollen lymph glands, thirst - excessive , urination - excessive , urination changes/pain, urine leakage, vision changes, and voice change.    Vital Signs:  Patient profile:   70 year old male Height:      68 inches Weight:      211.38 pounds BMI:  32.26 Pulse rate:   64 /  minute Pulse rhythm:   regular BP sitting:   110 / 72  (left arm) Cuff size:   regular  Vitals Entered By: June McMurray CMA Duncan Dull) (November 18, 2009 1:48 PM)  Physical Exam  General:  Well developed, well nourished, no acute distress. Head:  Normocephalic and atraumatic. Eyes:  PERRLA, no icterus.exam deferred to patient's ophthalmologist.   Mouth:  No deformity or lesions, dentition normal. Neck:  Supple; no masses or thyromegaly. Lungs:  Clear throughout to auscultation.decreased BS on L and decreased BS on R.   Heart:  Regular rate and rhythm; no murmurs, rubs,  or bruits. Abdomen:  Soft, nontender and nondistended. No masses, hepatosplenomegaly or hernias noted. Normal bowel sounds. Extremities:  No clubbing, cyanosis, edema or deformities noted. Neurologic:  Alert and  oriented x4;  grossly normal neurologically. Cervical Nodes:  No significant cervical adenopathy. Psych:  Alert and cooperative. Normal mood and affect.easily distracted and poor concentration.     Impression & Recommendations:  Problem # 1:  GASTROESOPHAGEAL REFLUX DISEASE (ICD-530.81) Assessment Deteriorated Will try Dexilant 60 mg q.a.m. and domperidone 10 mg at bedtime as tolerated. 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 He is not a good surgical candidate at this time. He may have an element of delayed gastric emptying. 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 We will make the above  mentioned changes in his reflux regime and consider repeat endoscopy, manometry, and 24-hour pH probe testing depending on his clinical progress. We have established  him with Dr. Yetta Barre for primary care evaluation and followup.  Other Orders: Primary Care Referral (Primary)  Patient Instructions: 1)  Please go to the basement today for your labs.  2)  Your prescription has been sent to a  pharmacy in [place], they will contact you about the Domperidone rx. 3)  We have made you an appt with Dr. Sanda Linger on 11/27/2009 please arrive at  2:30pm ,he is located on the first floor of the [place] building on [address]. If you can not make this appt please call their office at [phone] to cx 24 hours prior. They do have a $50 cx fee if you do not cancel within 24 hours. 4)  The medication list was reviewed and reconciled.  All changed / newly prescribed medications were explained.  A complete medication list was provided to the patient / caregiver. 5)  Avoid foods high in acid content ( tomatoes, citrus juices, spicy foods) . Avoid eating within 3 to 4 hours of lying down or before exercising. Do not over eat; try smaller more frequent meals. Elevate head of bed four inches when sleeping.  6)  Please schedule a follow-up appointment as needed.  Prescriptions: DOMPERIDONE 10MG  take one by mouth at bedtime  #30 x 3   Entered by:   Harlow Mares CMA (AAMA)   Authorized by:   Mardella Layman MD [facility]   Signed by:   Harlow Mares CMA (AAMA) on 11/18/2009   Method used:   Faxed to ...       [facility]* (retail)       [phone]  [address]       [place], [place]  [address]       Ph: [phone]       Fax: [phone]   RxID:   [id number]

## 2010-05-18 ENCOUNTER — Ambulatory Visit (INDEPENDENT_AMBULATORY_CARE_PROVIDER_SITE_OTHER): Payer: Medicare Other | Admitting: Internal Medicine

## 2010-05-18 ENCOUNTER — Encounter: Payer: Self-pay | Admitting: Internal Medicine

## 2010-05-18 ENCOUNTER — Other Ambulatory Visit: Payer: Medicare Other

## 2010-05-18 ENCOUNTER — Other Ambulatory Visit: Payer: Self-pay | Admitting: Internal Medicine

## 2010-05-18 DIAGNOSIS — E781 Pure hyperglyceridemia: Secondary | ICD-10-CM

## 2010-05-18 DIAGNOSIS — E785 Hyperlipidemia, unspecified: Secondary | ICD-10-CM

## 2010-05-18 LAB — LIPID PANEL
Cholesterol: 157 mg/dL (ref 0–200)
Total CHOL/HDL Ratio: 5
Triglycerides: 272 mg/dL — ABNORMAL HIGH (ref 0.0–149.0)

## 2010-05-18 LAB — HEPATIC FUNCTION PANEL
ALT: 25 U/L (ref 0–53)
Bilirubin, Direct: 0.1 mg/dL (ref 0.0–0.3)
Total Protein: 6.5 g/dL (ref 6.0–8.3)

## 2010-05-20 ENCOUNTER — Other Ambulatory Visit: Payer: Self-pay | Admitting: Gastroenterology

## 2010-05-20 MED ORDER — AMBULATORY NON FORMULARY MEDICATION: 1.0000 | Freq: Every day | Status: DC

## 2010-05-20 NOTE — Telephone Encounter (Signed)
rx sent to pharm

## 2010-05-28 NOTE — Assessment & Plan Note (Signed)
Summary: 4 MTH FU-STC   Vital Signs:  Patient profile:   70 year old male Height:      68 inches Weight:      219.25 pounds BMI:     33.46 O2 Sat:      97 % on Room air Temp:     97.8 degrees F oral Pulse rate:   64 / minute Pulse rhythm:   regular Resp:     16 per minute BP sitting:   138 / 84  (left arm) Cuff size:   large  Vitals Entered By: Rock Nephew CMA (May 18, 2010 9:41 AM)  Nutrition Counseling: Patient's BMI is greater than 25 and therefore counseled on weight management options.  O2 Flow:  Room air CC: follow-up visit// 4mos Is Patient Diabetic? No Pain Assessment Patient in pain? no       Does patient need assistance? Functional Status Self care Ambulation Normal   Primary Care Provider:  [name] MD  CC:  follow-up visit// 4mos.  History of Present Illness:  Hyperlipidemia Follow-Up      This is a 70 year old man who presents for Hyperlipidemia follow-up.  The patient denies muscle aches, abdominal pain, diarrhea, and fatigue.  The patient denies the following symptoms: chest pain/pressure, exercise intolerance, dypsnea, palpitations, syncope, and pedal edema.  Compliance with medications (by patient report) has been near 100%.  Dietary compliance has been good.  The patient reports exercising daily.  Adjunctive measures currently used by the patient include fiber, fish oil supplements, and weight reduction.    Preventive Screening-Counseling & Management  Alcohol-Tobacco     Alcohol drinks/day: 0     Alcohol Counseling: not indicated; patient does not drink     Smoking Status: never     Year Quit: 30 years ago     Tobacco Counseling: not indicated; no tobacco use  Hep-HIV-STD-Contraception     Hepatitis Risk: no risk noted     HIV Risk: no risk noted     STD Risk: no risk noted      Sexual History:  currently monogamous.        Drug Use:  never.        Blood Transfusions:  no.    Clinical Review Panels:  Prevention   Last  Colonoscopy:  Normal (02/11/2006)   Last PSA:  0.19 (01/19/2010)  Immunizations   Last Tetanus Booster:  Historical (03/01/2006)   Last Flu Vaccine:  Fluvax MCR (01/19/2010)  Lipid Management   Cholesterol:  171 (01/19/2010)   HDL (good cholesterol):  28.30 (01/19/2010)  Diabetes Management   Creatinine:  0.8 (01/19/2010)   Last Flu Vaccine:  Fluvax MCR (01/19/2010)  CBC   WBC:  6.3 (01/19/2010)   RBC:  4.58 (01/19/2010)   Hgb:  15.7 (01/19/2010)   Hct:  44.2 (01/19/2010)   Platelets:  207.0 (01/19/2010)   MCV  96.6 (01/19/2010)   MCHC  35.5 (01/19/2010)   RDW  14.4 (01/19/2010)   PMN:  67.1 (01/19/2010)   Lymphs:  22.2 (01/19/2010)   Monos:  7.7 (01/19/2010)   Eosinophils:  2.6 (01/19/2010)   Basophil:  0.4 (01/19/2010)  Complete Metabolic Panel   Glucose:  91 (01/19/2010)   Sodium:  141 (01/19/2010)   Potassium:  3.9 (01/19/2010)   Chloride:  106 (01/19/2010)   CO2:  26 (01/19/2010)   BUN:  11 (01/19/2010)   Creatinine:  0.8 (01/19/2010)   Albumin:  4.1 (01/19/2010)   Total Protein:  6.7 (01/19/2010)  Calcium:  9.0 (01/19/2010)   Total Bili:  0.9 (01/19/2010)   Alk Phos:  70 (01/19/2010)   SGPT (ALT):  27 (01/19/2010)   SGOT (AST):  21 (01/19/2010)   Medications Prior to Update: 1)  Naproxen Sodium 550 Mg Tabs (Naproxen Sodium) .... Take 1 Tablet By Mouth Once A Day As Needed 2)  Metoclopramide Hcl 10 Mg Tabs (Metoclopramide Hcl) .... Take 1 Tablet By Mouth Once A Day As Needed 3)  Multivitamins  Tabs (Multiple Vitamin) .... Take 1 Tablet By Mouth Once A Day 4)  Fish Oil 1000 Mg Caps (Omega-3 Fatty Acids) .... Take 1 Tablet By Mouth Once A Day 5)  Nexium 40 Mg Cpdr (Esomeprazole Magnesium) .... Take 1 Tab 30 Minutes Before Meals Daily 6)  Nasal Spray 30dhe .... As Needed 7)  Dexilant 60 Mg Cpdr (Dexlansoprazole) .... Take One By Mouth Every Morning. 8)  Domperidone 10mg  .... Take One By Mouth At Bedtime 9)  C-Erogtamine 1mg   Current Medications  (verified): 1)  Naproxen Sodium 550 Mg Tabs (Naproxen Sodium) .... Take 1 Tablet By Mouth Once A Day As Needed 2)  Metoclopramide Hcl 10 Mg Tabs (Metoclopramide Hcl) .... Take 1 Tablet By Mouth Once A Day As Needed 3)  Multivitamins  Tabs (Multiple Vitamin) .... Take 1 Tablet By Mouth Once A Day 4)  Fish Oil 1000 Mg Caps (Omega-3 Fatty Acids) .... Take 1 Tablet By Mouth Once A Day 5)  Nexium 40 Mg Cpdr (Esomeprazole Magnesium) .... Take 1 Tab 30 Minutes Before Meals Daily 6)  Nasal Spray 30dhe .... As Needed 7)  Domperidone 10mg  .... 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  Alcohol Use - no Occupation: Retired  Daily Caffeine Use  Risk Factors: Alcohol Use: 0 (05/18/2010)  Risk Factors: Smoking Status: never (05/18/2010)  Family History: Reviewed history from 11/23/2007 and no changes required. No FH of Colon Cancer:  Social History: Reviewed history from 11/18/2009 and no changes required. Patient is a former smoker.  Alcohol Use - no Occupation: Retired  Daily Caffeine Use  Review of Systems  The patient denies anorexia, fever, chest pain, syncope, dyspnea on exertion, peripheral edema, prolonged cough, headaches, hemoptysis, abdominal pain, difficulty walking, and depression.     Physical Exam  General:  alert, well-developed, well-nourished, well-hydrated, appropriate dress, normal appearance, healthy-appearing, cooperative to examination, good hygiene, and overweight-appearing.   Mouth:  Oral mucosa and oropharynx without lesions or exudates.  Teeth in good repair. Neck:  supple, full ROM, no masses, no thyromegaly, no thyroid nodules or tenderness, no JVD, normal carotid upstroke, no carotid bruits, no cervical lymphadenopathy, and no neck tenderness.   Lungs:  normal respiratory effort, no intercostal retractions, no accessory muscle use, normal breath sounds, no dullness, no fremitus, no crackles, and no wheezes.   Heart:  normal rate, regular rhythm, no murmur, no gallop, no rub, and no JVD.   Abdomen:  soft, non-tender, normal bowel sounds, no distention, no masses, no guarding, no rigidity,  no rebound tenderness, no abdominal hernia, no inguinal hernia, no hepatomegaly, and no splenomegaly.   Msk:  normal ROM, no joint tenderness, no joint swelling, no joint warmth, no redness over joints, no joint deformities, no joint instability, no crepitation, and no muscle atrophy.   Pulses:  R and L carotid,radial,femoral,dorsalis pedis and posterior tibial pulses are full and equal bilaterally Extremities:  No clubbing, cyanosis, edema, or deformity noted with normal full range of motion of all joints.   Neurologic:  No cranial nerve deficits noted. Station and gait are normal. Plantar reflexes are down-going bilaterally. DTRs are symmetrical throughout. Sensory, motor and coordinative functions appear intact. Skin:  turgor normal, color normal, no rashes, no suspicious lesions, no ecchymoses, no petechiae, no purpura, no ulcerations, and no edema.   Cervical Nodes:  no anterior cervical adenopathy and no posterior cervical adenopathy.   Psych:  Oriented X3, memory intact for recent and remote, normally interactive, good eye contact, not anxious appearing, not depressed  appearing, not agitated, and not suicidal.     Impression & Recommendations:  Problem # 1:  HYPERTRIGLYCERIDEMIA (ICD-272.1) Assessment Unchanged  Orders: TLB-Lipid Panel (80061-LIPID) TLB-Hepatic/Liver Function Pnl (80076-HEPATIC)  Labs Reviewed: SGOT: 21 (01/19/2010)   SGPT: 27 (01/19/2010)  Lipid Goals: Chol Goal: 200 (01/19/2010)   HDL Goal: 40 (01/19/2010)   LDL Goal: 160 (01/19/2010)   TG Goal: 150 (01/19/2010)  Prior 10 Yr Risk Heart Disease: Not enough information (01/19/2010)   HDL:28.30 (01/19/2010)  Chol:171 (01/19/2010)  Trig:316.0 (01/19/2010)  Complete Medication List: 1)  Naproxen Sodium 550 Mg Tabs (Naproxen sodium) .... Take 1 tablet by mouth once a day as needed 2)  Metoclopramide Hcl 10 Mg Tabs (Metoclopramide hcl) .... Take 1 tablet by mouth once a day as needed 3)  Multivitamins Tabs (Multiple vitamin) .... Take 1 tablet by mouth once a day 4)  Fish Oil 1000 Mg Caps (Omega-3 fatty acids) .... Take 1 tablet by mouth once a day 5)  Nexium 40 Mg Cpdr (Esomeprazole magnesium) .... Take 1 tab 30 minutes before meals daily 6)  Nasal Spray 30dhe  .... As needed 7)  Domperidone 10mg   .... Take one by mouth at bedtime 8)  C-erogtamine 1mg    Patient Instructions: 1)  Please schedule a follow-up appointment in 3 months. 2)  It is important that you exercise regularly at least 20 minutes 5 times a week. If you develop chest pain, have severe difficulty breathing, or feel very tired , stop exercising immediately and seek medical attention. 3)  You need to lose weight. Consider a lower calorie diet and regular exercise.    Orders Added: 1)  TLB-Lipid Panel [80061-LIPID] 2)  TLB-Hepatic/Liver Function Pnl [80076-HEPATIC] 3)  Est. Patient Level III [04540]

## 2010-05-28 NOTE — Letter (Signed)
Summary: Lipid Letter  [facility]  [address] [place], [place] [address]   Phone: [phone]  Fax: [phone]    05/18/2010  Frank [name] [address] [place], [place]  [address]  Dear Frank [name]:  We have carefully reviewed your last lipid profile from  and the results are noted below with a summary of recommendations for lipid management.    Cholesterol:       157     Goal: <200   HDL "good" Cholesterol:   32.44     Goal: >40   LDL "bad" Cholesterol:   80     Goal: <160   Triglycerides:       272.0     Goal: <150        TLC Diet (Therapeutic Lifestyle Change): Saturated Fats & Transfatty acids should be kept < 7% of total calories ***Reduce Saturated Fats Polyunstaurated Fat can be up to 10% of total calories Monounsaturated Fat Fat can be up to 20% of total calories Total Fat should be no greater than 25-35% of total calories Carbohydrates should be 50-60% of total calories Protein should be approximately 15% of total calories Fiber should be at least 20-30 grams a day ***Increased fiber may help lower LDL Total Cholesterol should be < 200mg /day Consider adding plant stanol/sterols to diet (example: Benacol spread) ***A higher intake of unsaturated fat may reduce Triglycerides and Increase HDL    Adjunctive Measures (may lower LIPIDS and reduce risk of Heart Attack) include: Aerobic Exercise (20-30 minutes 3-4 times a week) Limit Alcohol Consumption Weight Reduction Aspirin 75-81 mg a day by mouth (if not allergic or contraindicated) Dietary Fiber 20-30 grams a day by mouth     Current Medications: 1)    Naproxen Sodium 550 Mg Tabs (Naproxen sodium) .... Take 1 tablet by mouth once a day as needed 2)    Metoclopramide Hcl 10 Mg Tabs (Metoclopramide hcl) .... Take 1 tablet by mouth once a day as needed 3)    Multivitamins  Tabs (Multiple vitamin) .... Take 1 tablet by mouth once a day 4)    Fish Oil 1000 Mg Caps (Omega-3 fatty acids) .... Take  1 tablet by mouth once a day 5)    Nexium 40 Mg Cpdr (Esomeprazole magnesium) .... Take 1 tab 30 minutes before meals daily 6)    Nasal Spray 30dhe  .... As needed 7)    Domperidone 10mg   .... Take one by mouth at bedtime 8)    C-erogtamine 1mg    If you have any questions, please call. We appreciate being able to work with you.   Sincerely,     [facility] Frank Grandchild MD

## 2010-07-14 NOTE — Assessment & Plan Note (Signed)
[facility]                         GASTROENTEROLOGY OFFICE NOTE   RONTE, PARKER                      MRN:          [id number]  DATE:11/15/2006                            DOB:          02/09/1941    Mr. Wild is a elderly white male with chronic acid reflux, followed up  for many years by Dr. Victorino Dike. He has been on chronic PPI therapy.  He also has diverticulosis and recurrent hemorrhoids. He last had  colonoscopy in December of 2007 and his last endoscopy was many years  ago, last done in 1999. At that time he was having rather severe acid  reflux and had manometry that showed an incompetent lower esophageal  sphincter and a normal peristalsis but very low amplitude contraction  waves in his distal esophagus. Patient has seen Dr. Claud Kelp past  for consideration of hiatal hernia surgery but this was not completed  since he was doing so well on medical therapy.   The patient returns today and is really not having any severe acid  reflux symptoms but having some nocturnal regurgitation. He denies any  hepatobiliary complaints, anorexia, weight loss, or lower bowel  problems. He is taking Aciphex 20 mg a day after breakfast, Naproxen 550  mg a day, and he was recently prescribed Reglan 10 mg at bedtime which  he has really not tried. He also takes a variety of other medications  listed and reviewed in his chart.   He weighs 211 pounds, and blood pressure 140/76, and pulse was 64 and  regular.  I could not appreciate stigmata of chronic liver disease.  His exam showed diminished breath sounds in both lung fields without any  definite rales. He was in a regular rhythm without murmurs, gallops, or  rubs.  I could not appreciate hepatosplenomegaly, abdominal masses, or  tenderness. Bowel sounds were normal.  Peripheral extremities were unremarkable.  RECTAL EXAM: Deferred.   ASSESSMENT:  Mr. Collard gastroesophageal reflux disease  seems to be  doing fairly well and I have instructed him how to take his medications.  I really see no need for repeat endoscopy at this time.   RECOMMENDATIONS:  1. Aciphex 20 minutes before breakfast each morning.  2. Reglan 10 mg at bedtime.  3. Standard antireflux maneuvers.  4. P.r.n. gastrointestinal follow up as needed.   ADDENDUM:  This patient of Dr. 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 However, I can not see  where he has seen Dr. Debby Bud within the last several years.  Actually,  on reviewing his chart, he is currently followed by Dr. Evelena Peat.     Vania Rea. Jarold Motto, MD, Caleen Essex, FAGA  Electronically Signed    DRP/MedQ  DD: 11/15/2006  DT: 11/15/2006  Job #: [id number]   cc:   Rosalyn Gess. Norins, MD  Evelena Peat, M.D.
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
## 2010-07-17 NOTE — Discharge Summary (Signed)
NAME:  Frank [name], Frank [name]               ACCOUNT NO.:  [id number]   MEDICAL RECORD NO.:  [id number]          PATIENT TYPE:  INP   LOCATION:  1401                         FACILITY:  [facility]   PHYSICIAN:  Elliot Cousin, M.D.    DATE OF BIRTH:  06/24/1940   DATE OF ADMISSION:  09/13/2005  DATE OF DISCHARGE:  09/14/2005                                 DISCHARGE SUMMARY   DISCHARGE DIAGNOSES:  1.  Syncope, possibly secondary to medications.  2.  Sinus bradycardia.  3.  Hypertriglyceridemia.  4.  Seasonal allergies.  5.  Migraine headaches.  6.  Gastroesophageal reflux disease.  7.  Hearing impairment (the patient wears hearing aids).  8.  Status post hernia repair in the past.  9.  Status post sinus surgery in the past.   DISCHARGE MEDICATIONS:  1.  DO NOT TAKE BENADRYL FOR NOW.  2.  Allegra 60 mg 1-2 tablets daily as needed for allergy symptoms.  3.  Naproxen 550 mg 1 pill every 6 hours as needed.  4.  AcipHex 20 mg daily.  5.  Cafergot 1-100 mg 1 pill daily as needed.  6.  Reglan 10 mg every 6 hours as needed.   DISCHARGE DISPOSITION:  The patient was discharged to home in improved and  stable condition on July17,2007.  He was advised to follow up with Dr.  Caryl Never in 1 week.   PROCEDURE PERFORMED:  1.  MRI/MRA of the brain.  2.  Two-D echocardiogram.  3.  Carotid Doppler study.   HISTORY OF PRESENT ILLNESS:  The patient is a 70 year old man with a past  medical history significant for seasonal allergies, migraine headaches, and  gastroesophageal reflux disease.  He presented to the emergency department  on July16,2007 after he passed out at home.  The patient and his wife stated  that he mowed his lawn the day before and subsequently suffered from allergy  symptoms.  He normally takes either Claritin or Allegra; however, he wanted  to try something different.  He therefore took 50 mg of Benadryl.  Shortly  thereafter, he took his usual migraine headache medications including  Naprosyn, Cafergot, and Reglan.  He subsequently developed a general ill  feeling and according to his wife passed out.  The patient did not recall  this.  His wife stated that he passed out for several minutes, and she was  unable to wake him.  His eyes at one point were apparently opened; however,  he was nonresponsive.  When the patient was evaluated in the emergency  department by Dr. Darnelle Catalan, he was alert and oriented.  He reported having a  history of feeling weak if he does not eat breakfast or snacks.  Apparently  on the day of admission, he did not have breakfast or a snack.  He also  reported a history of a slow heart rate but no significant side effects from  the slow heart rate.  However, given the patient's age and his presentation,  he was admitted for further evaluation and management.   HOSPITAL COURSE:  1.  SYNCOPE.  For further evaluation,  cardiac enzymes, TSH, and a fasting      lipid panel were ordered.  The patient's cardiac enzymes were completely      negative x3 sets.  His TSH was within normal limits at 1.869.  His      fasting lipid panel revealed a total cholesterol of 159, triglycerides      248, HDL cholesterol 25, and LDL cholesterol 84.  His EKG on admission      revealed normal sinus rhythm with a heart rate of 66 beats per minute      with no acute abnormalities.  For further evaluation, a 2-D      echocardiogram, MRI of the brain, and carotid Dopplers were ordered.      The carotid Doppler study was negative for ICA stenosis.  The      preliminary results of the MRI of the brain revealed no acute      intracranial findings.  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      A MRA of the brain was also ordered and was grossly negative for      significant stenosis.  The 2-D echocardiogram was performed; however,      the results are currently pending.  Orthostatic vital signs  were      assessed, and the patient was not found  to be orthostatic.  Over the      course of the hospitalization, the patient had no complaints of      dizziness or blackout spells.  He ambulated in the hallway with the      nursing staff without any abnormalities, shortness of breath, chest      pain, or dizziness.  Of note, there were no acute changes on the      telemetry during the entire hospitalization.  A repeat EKG did reveal      sinus bradycardia with a heart rate of 57 beats per minute.  However,      there were no acute ST or T-wave abnormalities seen.     The exact etiology of the patient's syncopal episode is unclear.  However,  more than likely, the patient experienced a side effect from taking a  combination of Benadryl (which he had never taken before) along with  Naprosyn, Cafergot and Reglan.  The patient was completely asymptomatic at  the time of hospital discharge.  He was advised to follow up with his  primary care physician, Dr. Caryl Never, in 1 week.  For now, he should avoid  Benadryl.  A prescription was written for Allegra as needed for seasonal  allergies.   1.  HYPERTRIGLYCERIDEMIA.  As stated above, the patient's triglyceride level      was elevated at 248.  Per his history, the patient is being treated with      lifestyle changes including diet and exercise and fish oil capsules.      Further management will be deferred to Dr. Caryl Never.   DISCHARGE LABORATORY DATA:  CK 152, CK-MB 3.6, troponin I 0.03.  Sodium 141,  potassium 3.7, chloride 109, CO2 24, glucose 106, BUN 11, creatinine 0.9,  calcium 8.9.  WBC 8.4, hemoglobin 14.9, hematocrit 43.2, platelets 219.   The 2-D echocardiogram results are pending.      Elliot Cousin, M.D.  Electronically Signed     DF/MEDQ  D:  09/14/2005  T:  09/15/2005  Job:  [id number]  cc:   Evelena Peat, M.D.  Fax: [phone]

## 2010-11-04 ENCOUNTER — Encounter: Payer: Self-pay | Admitting: *Deleted

## 2010-11-06 ENCOUNTER — Ambulatory Visit: Payer: Medicare Other | Admitting: Gastroenterology

## 2010-11-10 ENCOUNTER — Encounter: Payer: Self-pay | Admitting: Gastroenterology

## 2010-11-10 ENCOUNTER — Ambulatory Visit (INDEPENDENT_AMBULATORY_CARE_PROVIDER_SITE_OTHER): Payer: Medicare Other | Admitting: Gastroenterology

## 2010-11-10 VITALS — BP 110/64 | HR 78 | Ht 66.0 in | Wt 220.0 lb

## 2010-11-10 DIAGNOSIS — R0989 Other specified symptoms and signs involving the circulatory and respiratory systems: Secondary | ICD-10-CM | POA: Insufficient documentation

## 2010-11-10 DIAGNOSIS — K219 Gastro-esophageal reflux disease without esophagitis: Secondary | ICD-10-CM | POA: Insufficient documentation

## 2010-11-10 DIAGNOSIS — F449 Dissociative and conversion disorder, unspecified: Secondary | ICD-10-CM

## 2010-11-10 DIAGNOSIS — K432 Incisional hernia without obstruction or gangrene: Secondary | ICD-10-CM | POA: Insufficient documentation

## 2010-11-10 DIAGNOSIS — R09A2 Foreign body sensation, throat: Secondary | ICD-10-CM

## 2010-11-10 HISTORY — DX: Gastro-esophageal reflux disease without esophagitis: K21.9

## 2010-11-10 MED ORDER — ESOMEPRAZOLE MAGNESIUM 40 MG PO CPDR: 40.0000 mg | DELAYED_RELEASE_CAPSULE | Freq: Every day | ORAL | Status: DC

## 2010-11-10 NOTE — Progress Notes (Signed)
This is a very verbose T35-year-old Caucasian male with chronic GERD and associated globus sensation area and he is on Nexium 40 mg a day, and trials of domperidone for suspected delayed gastric emptying or nonproductive. 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 Apparently has had several negative ENT evaluations. Last endoscopy was in 1999, he has refused followup. He is up-to-date on his colonoscopy exams. Currently he denies any GI symptomatology or general medical problems. He does have chronic headaches and [facility] Labs have been requested. He does have continued hoarseness and throat clearing, but denies dysphagia, or any other gastrointestinal or hepatobiliary complaints.  Current Medications, Allergies, Past Medical History, Past Surgical History, Family History and Social History were reviewed in [employer] record.  Pertinent Review of Systems Negative... no history of cigarette abuse or any pulmonary or cardiovascular symptomatology.  Physical Exam: Awake and alert appeared his stated age. I cannot appreciate stigmata of chronic liver disease. He has diminished breath sounds in both lung fields but no wheezes or rhonchi. He appears to be in a regular rhythm without murmurs gallops or rubs. He is a somewhat obese abdomen but organomegaly, masses or tenderness. He does have a small incisional hernia above his umbilicus. This is easily reducible and is nontender. Bowel sounds are normal. Mental status is normal. Peripheral extremities are unremarkable without edema, phlebitis, or swollen joints.   Assessment and Plan: Chronic GERD with associated globus sensation during fairly well on chronic PPI therapy. I have renewed his medications and will see him as needed. Reflux regime again reviewed with this patient. He is continue other medications per [facility]  care. Labs from neurology are pending. Encounter Diagnoses  Name Primary?  . GERD (gastroesophageal reflux disease) Yes  . Globus syndrome   . Incisional hernia

## 2010-11-10 NOTE — Patient Instructions (Signed)
Your prescription(s) have been sent to you pharmacy.   

## 2011-03-22 DIAGNOSIS — Z23 Encounter for immunization: Secondary | ICD-10-CM | POA: Diagnosis not present

## 2011-04-15 ENCOUNTER — Emergency Department (HOSPITAL_COMMUNITY): Payer: Medicare Other

## 2011-04-15 ENCOUNTER — Encounter (HOSPITAL_COMMUNITY): Payer: Self-pay | Admitting: Neurology

## 2011-04-15 ENCOUNTER — Inpatient Hospital Stay (HOSPITAL_COMMUNITY)
Admission: EM | Admit: 2011-04-15 | Discharge: 2011-04-17 | DRG: 310 | Disposition: A | Discharge status: Home / Self Care | Payer: Medicare Other | Attending: Cardiology | Admitting: Cardiology

## 2011-04-15 ENCOUNTER — Other Ambulatory Visit: Payer: Self-pay

## 2011-04-15 DIAGNOSIS — Z7982 Long term (current) use of aspirin: Secondary | ICD-10-CM

## 2011-04-15 DIAGNOSIS — I499 Cardiac arrhythmia, unspecified: Secondary | ICD-10-CM | POA: Diagnosis not present

## 2011-04-15 DIAGNOSIS — I480 Paroxysmal atrial fibrillation: Secondary | ICD-10-CM | POA: Diagnosis present

## 2011-04-15 DIAGNOSIS — Z888 Allergy status to other drugs, medicaments and biological substances status: Secondary | ICD-10-CM

## 2011-04-15 DIAGNOSIS — R4020 Unspecified coma: Secondary | ICD-10-CM | POA: Diagnosis present

## 2011-04-15 DIAGNOSIS — E785 Hyperlipidemia, unspecified: Secondary | ICD-10-CM | POA: Diagnosis present

## 2011-04-15 DIAGNOSIS — I4891 Unspecified atrial fibrillation: Principal | ICD-10-CM

## 2011-04-15 DIAGNOSIS — E781 Pure hyperglyceridemia: Secondary | ICD-10-CM | POA: Diagnosis present

## 2011-04-15 DIAGNOSIS — Z91010 Allergy to peanuts: Secondary | ICD-10-CM

## 2011-04-15 DIAGNOSIS — R55 Syncope and collapse: Secondary | ICD-10-CM

## 2011-04-15 DIAGNOSIS — R51 Headache: Secondary | ICD-10-CM | POA: Diagnosis present

## 2011-04-15 DIAGNOSIS — R9389 Abnormal findings on diagnostic imaging of other specified body structures: Secondary | ICD-10-CM | POA: Diagnosis not present

## 2011-04-15 DIAGNOSIS — Z79899 Other long term (current) drug therapy: Secondary | ICD-10-CM

## 2011-04-15 DIAGNOSIS — K219 Gastro-esophageal reflux disease without esophagitis: Secondary | ICD-10-CM | POA: Diagnosis present

## 2011-04-15 DIAGNOSIS — K222 Esophageal obstruction: Secondary | ICD-10-CM | POA: Diagnosis present

## 2011-04-15 LAB — MAGNESIUM: Magnesium: 2 mg/dL (ref 1.5–2.5)

## 2011-04-15 LAB — TSH: TSH: 1.744 u[IU]/mL (ref 0.350–4.500)

## 2011-04-15 LAB — URINALYSIS, ROUTINE W REFLEX MICROSCOPIC
Hgb urine dipstick: NEGATIVE
Ketones, ur: 15 mg/dL — AB
pH: 7 (ref 5.0–8.0)

## 2011-04-15 LAB — BASIC METABOLIC PANEL
Calcium: 9.6 mg/dL (ref 8.4–10.5)
GFR calc Af Amer: >90 mL/min (ref >90)
GFR calc non Af Amer: >90 mL/min (ref >90)

## 2011-04-15 LAB — PROTIME-INR: INR: 1 (ref 0.00–1.49)

## 2011-04-15 LAB — CBC
HCT: 46.1 % (ref 39.0–52.0)
Hemoglobin: 16.4 g/dL (ref 13.0–17.0)
MCH: 33.2 pg (ref 26.0–34.0)
MCV: 93.3 fL (ref 78.0–100.0)
RBC: 4.94 MIL/uL (ref 4.22–5.81)
RDW: 13.7 % (ref 11.5–15.5)

## 2011-04-15 LAB — RAPID URINE DRUG SCREEN, HOSP PERFORMED
Amphetamines: NOT DETECTED
Benzodiazepines: NOT DETECTED
Cocaine: NOT DETECTED
Tetrahydrocannabinol: NOT DETECTED

## 2011-04-15 LAB — POCT I-STAT TROPONIN I

## 2011-04-15 MED ORDER — DILTIAZEM HCL 100 MG IV SOLR
5.0000 mg/h | INTRAVENOUS | Status: DC
  Administered 2011-04-15 – 2011-04-16 (×2): 5 mg/h via INTRAVENOUS
  Filled 2011-04-15 (×2): qty 100

## 2011-04-15 MED ORDER — DILTIAZEM HCL 50 MG/10ML IV SOLN
10.0000 mg | Freq: Once | INTRAVENOUS | Status: DC
Start: 2011-04-15 — End: 2011-04-15

## 2011-04-15 MED ORDER — DILTIAZEM HCL 25 MG/5ML IV SOLN
10.0000 mg | Freq: Once | INTRAVENOUS | Status: AC
  Administered 2011-04-15: 10 mg via INTRAVENOUS
  Filled 2011-04-15: qty 5

## 2011-04-15 NOTE — ED Notes (Addendum)
The patient's wife is Osiel Stick.  She can be reached at [phone] (home) or [phone] (cell) or [phone] (spouse's cell).  She would like to be called when the patient has an inpatient bed.

## 2011-04-15 NOTE — ED Notes (Signed)
Cardizem 10 mg bolus given then 5 mg/hr.
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
## 2011-04-16 ENCOUNTER — Encounter (HOSPITAL_COMMUNITY): Payer: Self-pay | Admitting: Internal Medicine

## 2011-04-16 DIAGNOSIS — K219 Gastro-esophageal reflux disease without esophagitis: Secondary | ICD-10-CM | POA: Diagnosis present

## 2011-04-16 DIAGNOSIS — R55 Syncope and collapse: Secondary | ICD-10-CM | POA: Diagnosis not present

## 2011-04-16 DIAGNOSIS — E785 Hyperlipidemia, unspecified: Secondary | ICD-10-CM | POA: Diagnosis present

## 2011-04-16 DIAGNOSIS — K222 Esophageal obstruction: Secondary | ICD-10-CM | POA: Diagnosis present

## 2011-04-16 DIAGNOSIS — I517 Cardiomegaly: Secondary | ICD-10-CM | POA: Diagnosis not present

## 2011-04-16 DIAGNOSIS — Z91010 Allergy to peanuts: Secondary | ICD-10-CM | POA: Diagnosis not present

## 2011-04-16 DIAGNOSIS — Z79899 Other long term (current) drug therapy: Secondary | ICD-10-CM | POA: Diagnosis not present

## 2011-04-16 DIAGNOSIS — I4891 Unspecified atrial fibrillation: Secondary | ICD-10-CM | POA: Diagnosis not present

## 2011-04-16 DIAGNOSIS — Z7982 Long term (current) use of aspirin: Secondary | ICD-10-CM | POA: Diagnosis not present

## 2011-04-16 DIAGNOSIS — R51 Headache: Secondary | ICD-10-CM | POA: Diagnosis present

## 2011-04-16 DIAGNOSIS — Z888 Allergy status to other drugs, medicaments and biological substances status: Secondary | ICD-10-CM | POA: Diagnosis not present

## 2011-04-16 DIAGNOSIS — E781 Pure hyperglyceridemia: Secondary | ICD-10-CM | POA: Diagnosis present

## 2011-04-16 DIAGNOSIS — R4020 Unspecified coma: Secondary | ICD-10-CM | POA: Diagnosis present

## 2011-04-16 DIAGNOSIS — I480 Paroxysmal atrial fibrillation: Secondary | ICD-10-CM | POA: Diagnosis present

## 2011-04-16 LAB — LIPID PANEL
HDL: 31 mg/dL — ABNORMAL LOW (ref >39)
LDL Cholesterol: 112 mg/dL — ABNORMAL HIGH (ref 0–99)

## 2011-04-16 LAB — CBC
HCT: 44.2 % (ref 39.0–52.0)
Hemoglobin: 16.1 g/dL (ref 13.0–17.0)
MCV: 93.2 fL (ref 78.0–100.0)
RBC: 4.74 MIL/uL (ref 4.22–5.81)
WBC: 11.2 10*3/uL — ABNORMAL HIGH (ref 4.0–10.5)

## 2011-04-16 LAB — BASIC METABOLIC PANEL
BUN: 9 mg/dL (ref 6–23)
CO2: 23 mEq/L (ref 19–32)
Chloride: 108 mEq/L (ref 96–112)
Creatinine, Ser: 0.78 mg/dL (ref 0.50–1.35)
Glucose, Bld: 103 mg/dL — ABNORMAL HIGH (ref 70–99)

## 2011-04-16 LAB — HEPARIN LEVEL (UNFRACTIONATED): Heparin Unfractionated: 0.26 IU/mL — ABNORMAL LOW (ref 0.30–0.70)

## 2011-04-16 LAB — CARDIAC PANEL(CRET KIN+CKTOT+MB+TROPI)
CK, MB: 4.7 ng/mL — ABNORMAL HIGH (ref 0.3–4.0)
Relative Index: 2.6 — ABNORMAL HIGH (ref 0.0–2.5)
Total CK: 179 U/L (ref 7–232)
Troponin I: <0.3 ng/mL (ref <0.30)

## 2011-04-16 LAB — MAGNESIUM: Magnesium: 2.1 mg/dL (ref 1.5–2.5)

## 2011-04-16 MED ORDER — ASPIRIN EC 81 MG PO TBEC
81.0000 mg | DELAYED_RELEASE_TABLET | Freq: Every day | ORAL | Status: DC
  Administered 2011-04-17: 81 mg via ORAL
  Filled 2011-04-16: qty 1

## 2011-04-16 MED ORDER — ONDANSETRON HCL 4 MG/2ML IJ SOLN: 4.0000 mg | Freq: Four times a day (QID) | INTRAMUSCULAR | Status: DC | PRN

## 2011-04-16 MED ORDER — ACETAMINOPHEN 325 MG PO TABS: 650.0000 mg | ORAL_TABLET | ORAL | Status: DC | PRN

## 2011-04-16 MED ORDER — HEPARIN SOD (PORCINE) IN D5W 100 UNIT/ML IV SOLN
1400.0000 [IU]/h | INTRAVENOUS | Status: DC
  Administered 2011-04-16: 1400 [IU]/h via INTRAVENOUS
  Filled 2011-04-16 (×2): qty 250

## 2011-04-16 MED ORDER — DILTIAZEM HCL ER COATED BEADS 180 MG PO CP24
180.0000 mg | ORAL_CAPSULE | Freq: Every day | ORAL | Status: DC
  Administered 2011-04-16 – 2011-04-17 (×2): 180 mg via ORAL
  Filled 2011-04-16 (×2): qty 1

## 2011-04-16 MED ORDER — ROSUVASTATIN CALCIUM 5 MG PO TABS
5.0000 mg | ORAL_TABLET | Freq: Every day | ORAL | Status: DC
  Administered 2011-04-16: 5 mg via ORAL
  Filled 2011-04-16 (×2): qty 1

## 2011-04-16 MED ORDER — SIMVASTATIN 20 MG PO TABS
20.0000 mg | ORAL_TABLET | Freq: Every day | ORAL | Status: DC
  Filled 2011-04-16: qty 1

## 2011-04-16 MED ORDER — OMEGA-3-ACID ETHYL ESTERS 1 G PO CAPS
2.0000 g | ORAL_CAPSULE | Freq: Every day | ORAL | Status: DC
  Administered 2011-04-16 – 2011-04-17 (×2): 2 g via ORAL
  Filled 2011-04-16 (×3): qty 2

## 2011-04-16 MED ORDER — NAPROXEN SODIUM 550 MG PO TABS: 550.0000 mg | ORAL_TABLET | Freq: Every day | ORAL | Status: DC | PRN

## 2011-04-16 MED ORDER — HEPARIN BOLUS VIA INFUSION
5000.0000 [IU] | Freq: Once | INTRAVENOUS | Status: AC
  Administered 2011-04-16: 5000 [IU] via INTRAVENOUS
  Filled 2011-04-16: qty 5000

## 2011-04-16 MED ORDER — METOCLOPRAMIDE HCL 10 MG PO TABS
10.0000 mg | ORAL_TABLET | Freq: Four times a day (QID) | ORAL | Status: DC | PRN
  Administered 2011-04-16: 10 mg via ORAL
  Filled 2011-04-16: qty 1

## 2011-04-16 MED ORDER — PANTOPRAZOLE SODIUM 40 MG PO TBEC
40.0000 mg | DELAYED_RELEASE_TABLET | Freq: Every day | ORAL | Status: DC
  Administered 2011-04-16: 40 mg via ORAL
  Filled 2011-04-16: qty 1

## 2011-04-16 MED ORDER — NITROGLYCERIN 0.4 MG SL SUBL: 0.4000 mg | SUBLINGUAL_TABLET | SUBLINGUAL | Status: DC | PRN

## 2011-04-16 MED ORDER — NAPROXEN 500 MG PO TABS
500.0000 mg | ORAL_TABLET | Freq: Every day | ORAL | Status: DC | PRN
Start: 2011-04-16 — End: 2011-04-17
  Administered 2011-04-16: 500 mg via ORAL
  Filled 2011-04-16: qty 1

## 2011-04-16 MED ORDER — ADULT MULTIVITAMIN W/MINERALS CH
1.0000 | ORAL_TABLET | Freq: Every day | ORAL | Status: DC
  Administered 2011-04-16 – 2011-04-17 (×2): 1 via ORAL
  Filled 2011-04-16 (×2): qty 1

## 2011-04-16 NOTE — ED Notes (Signed)
Called and gave report to [place].

## 2011-04-16 NOTE — Progress Notes (Signed)
UR Completed. Simmons, Kenyette Gundy F [phone]  

## 2011-04-16 NOTE — Progress Notes (Signed)
ANTICOAGULATION CONSULT NOTE - Initial Consult  Pharmacy Consult for heparin Indication: atrial fibrillation  Allergies  Allergen Reactions  . Codeine     REACTION: unspecified  . Peanut-Containing Drug Products    Vital Signs: Temp: 97.7 F (36.5 C) (02/14 1518) Temp src: Oral (02/14 1518) BP: 107/74 mmHg (02/15 0110) Pulse Rate: 78  (02/15 0110)  Labs:  Basename 04/15/11 2039 04/15/11 1609  HGB -- 16.4  HCT -- 46.1  PLT -- 180  APTT 29 --  LABPROT 13.4 --  INR 1.00 --  HEPARINUNFRC -- --  CREATININE -- 0.72  CKTOTAL -- --  CKMB -- --  TROPONINI -- --   Medical History: Past Medical History  Diagnosis Date  . HA (headache)   . Hypertriglyceridemia   . Hiatal hernia   . Esophageal stricture   . Esophageal reflux     Medications:  Prescriptions prior to admission  Medication Sig Dispense Refill  . Ascorbic Acid (VITAMIN C PO) Take 1 tablet by mouth daily.      . ERGOTAMINE-CAFFEINE PO Take 1 tablet by mouth as needed. For headaches/migraines.      Marland Kitchen esomeprazole (NEXIUM) 40 MG capsule Take 40 mg by mouth daily.      . fish oil-omega-3 fatty acids 1000 MG capsule Take 2 g by mouth daily.      . metoclopramide (REGLAN) 10 MG tablet Take 10 mg by mouth daily as needed.        . Multiple Vitamin (MULTIVITAMIN) tablet Take 1 tablet by mouth daily.       . naproxen sodium (ANAPROX) 550 MG tablet Take 550 mg by mouth daily.         Scheduled:    . aspirin EC  81 mg Oral Daily  . diltiazem  10 mg Intravenous Once  . heparin  5,000 Units Intravenous Once  . mulitivitamin with minerals  1 tablet Oral Daily  . omega-3 acid ethyl esters  2 g Oral Q breakfast  . pantoprazole  40 mg Oral Q1200  . simvastatin  20 mg Oral q1800  . DISCONTD: diltiazem  10 mg Intravenous Once    Assessment: 71yo male s/p syncopal episode found to be in new-onset Afib to begin heparin.  Goal of Therapy:  Heparin level 0.3-0.7 units/ml   Plan:  Will give heparin bolus of 5000 units  followed by gtt at 1400 units/hr and monitor heparin levels and CBC.  Colleen Can PharmD BCPS 04/16/2011,3:01 AM
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
## 2011-04-16 NOTE — Progress Notes (Signed)
Informed by patient's RN that he had converted to NSR with rates 55-60 bpm. Upon telemetry review, rate dropped from 120s to 55-60 bpm. Rhythm NSR at that time and maintained since. Patient states that his HR is always low. He is asymptomatic without complaints otherwise. Will switch Cardizem IV to PO. Will inform MD.   Jacqulyn Bath, PA-C 04/16/2011 9:14 AM

## 2011-04-16 NOTE — ED Notes (Signed)
Called to gave report.  Patient's room has been reassigned.  RN to call back in 5 minutes.

## 2011-04-17 DIAGNOSIS — I4891 Unspecified atrial fibrillation: Secondary | ICD-10-CM | POA: Diagnosis not present

## 2011-04-17 DIAGNOSIS — I517 Cardiomegaly: Secondary | ICD-10-CM | POA: Diagnosis not present

## 2011-04-17 LAB — CBC
HCT: 42 % (ref 39.0–52.0)
Hemoglobin: 14.8 g/dL (ref 13.0–17.0)
MCV: 94.8 fL (ref 78.0–100.0)
RBC: 4.43 MIL/uL (ref 4.22–5.81)
RDW: 14.1 % (ref 11.5–15.5)
WBC: 6.8 10*3/uL (ref 4.0–10.5)

## 2011-04-17 LAB — URINALYSIS, ROUTINE W REFLEX MICROSCOPIC
Bilirubin Urine: NEGATIVE
Glucose, UA: NEGATIVE mg/dL
Hgb urine dipstick: NEGATIVE
Specific Gravity, Urine: 1.03 (ref 1.005–1.030)

## 2011-04-17 MED ORDER — ASPIRIN 81 MG PO TBEC: 81.0000 mg | DELAYED_RELEASE_TABLET | Freq: Every day | ORAL | Status: DC

## 2011-04-17 MED ORDER — DILTIAZEM HCL ER COATED BEADS 180 MG PO CP24: 180.0000 mg | ORAL_CAPSULE | Freq: Every day | ORAL | Status: DC

## 2011-04-17 NOTE — Discharge Instructions (Signed)
Take caution while driving. If you do not feel well, pull over immediately and seek medical attention.
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
## 2011-04-17 NOTE — Progress Notes (Signed)
*  PRELIMINARY RESULTS* Echocardiogram 2D Echocardiogram has been performed.  Clide Deutscher 04/17/2011, 11:58 AM

## 2011-04-17 NOTE — Progress Notes (Signed)
   Subjective:  Patient has had no further atrial fib.   2D echo not yet done. Has not yet walked in hall   Objective:  Vital Signs in the last 24 hours: Temp:  [97.6 F (36.4 C)-98.5 F (36.9 C)] 97.6 F (36.4 C) (02/16 0519) Pulse Rate:  [47-58] 47  (02/16 0519) Resp:  [18] 18  (02/16 0519) BP: (98-113)/(55-71) 113/71 mmHg (02/16 0519) SpO2:  [96 %] 96 % (02/16 0519) Weight:  [207 lb 12.8 oz (94.257 kg)] 207 lb 12.8 oz (94.257 kg) (02/16 0519)  Intake/Output from previous day: 02/15 0701 - 02/16 0700 In: 480 [P.O.:480] Out: -  Intake/Output from this shift:       . aspirin EC  81 mg Oral Daily  . diltiazem  180 mg Oral Daily  . mulitivitamin with minerals  1 tablet Oral Daily  . omega-3 acid ethyl esters  2 g Oral Q breakfast  . pantoprazole  40 mg Oral Q1200  . rosuvastatin  5 mg Oral q1800      Physical Exam: The patient appears to be in no distress.  Head and neck exam reveals that the pupils are equal and reactive.  The extraocular movements are full.  There is no scleral icterus.  Mouth and pharynx are benign.  No lymphadenopathy.  No carotid bruits.  The jugular venous pressure is normal.  Thyroid is not enlarged or tender.  Chest is clear to percussion and auscultation.  No rales or rhonchi.  Expansion of the chest is symmetrical.  Heart reveals no abnormal lift or heave.  First and second heart sounds are normal.  There is no murmur gallop rub or click.  The abdomen is soft and nontender.  Bowel sounds are normoactive.  There is no hepatosplenomegaly or mass.  There are no abdominal bruits.  Extremities reveal no phlebitis or edema.  Pedal pulses are good.  There is no cyanosis or clubbing.  Neurologic exam is normal strength and no lateralizing weakness.  No sensory deficits.  Integument reveals no rash  Lab Results:  Basename 04/17/11 0530 04/16/11 0515  WBC 6.8 11.2*  HGB 14.8 16.1  PLT 171 183    Basename 04/16/11 0515 04/15/11 1609  NA 142  137  K 3.6 3.7  CL 108 103  CO2 23 23  GLUCOSE 103* 109*  BUN 9 10  CREATININE 0.78 0.72    Basename 04/16/11 0515  TROPONINI <0.30   Hepatic Function Panel No results found for this basename: PROT,ALBUMIN,AST,ALT,ALKPHOS,BILITOT,BILIDIR,IBILI in the last 72 hours  Basename 04/16/11 0515  CHOL 168   No results found for this basename: PROTIME in the last 72 hours  Imaging: Imaging results have been reviewed.  Borderline cardiomegaly.  Cardiac Studies: 2D echo pending. Assessment/Plan:  Patient Active Hospital Problem List: Atrial fibrillation (04/16/2011)   Assessment: Maintaining NSR.    Plan: Continue diltiazem. Syncope (04/16/2011)   Assessment: He states dizziness usually if he has failed to eat on schedule   Plan: Consider outpatient event monitor if he continues to have future dizzy spells.  Anticipate home later today if echo okay. Followup with Dr. Marcello Moores and with Dr. Elease Hashimoto.   LOS: 2 days    Frank [name] 04/17/2011, 10:33 AM

## 2011-04-17 NOTE — Progress Notes (Signed)
DC'd pt home with wife.  RN reviewed DC instructions and med rec instructions with wife.  RN dc'd IV with catheter intact. Pt and wife verbalized understanding of follow up appointments with MD.

## 2011-05-03 ENCOUNTER — Ambulatory Visit (INDEPENDENT_AMBULATORY_CARE_PROVIDER_SITE_OTHER): Payer: Medicare Other | Admitting: Nurse Practitioner

## 2011-05-03 ENCOUNTER — Encounter: Payer: Self-pay | Admitting: Nurse Practitioner

## 2011-05-03 VITALS — BP 104/74 | HR 48 | Ht 68.0 in | Wt 208.0 lb

## 2011-05-03 DIAGNOSIS — I4891 Unspecified atrial fibrillation: Secondary | ICD-10-CM

## 2011-05-03 DIAGNOSIS — R55 Syncope and collapse: Secondary | ICD-10-CM

## 2011-05-03 DIAGNOSIS — IMO0002 Reserved for concepts with insufficient information to code with codable children: Secondary | ICD-10-CM

## 2011-05-03 MED ORDER — DILTIAZEM HCL ER COATED BEADS 120 MG PO CP24: 120.0000 mg | ORAL_CAPSULE | Freq: Every day | ORAL | Status: DC
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
## 2011-05-03 NOTE — Assessment & Plan Note (Signed)
Patient presents with an episode of near syncope/syncope. Had atrial fib with a rate of 125. Converted with Diltiazem. Now bradycardic. I have placed an event monitor for the next 30 days. I have cut the diltiazem back to just 120 mg a day. We will see him back in 5 weeks to discuss. 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 We will see what the monitor shows. Patient is agreeable to this plan and will call if any problems develop in the interim.

## 2011-05-03 NOTE — Patient Instructions (Signed)
We are going to put a monitor on you for the next month to look at your rhythm.  I want you to cut the Cardizem back to 120 mg each day. This will help your heart not go to slow.  We will see you back in about 5 weeks.  Your activity is as tolerated.  Call the [facility] office at [phone] if you have any questions, problems or concerns.

## 2011-06-01 DIAGNOSIS — I4891 Unspecified atrial fibrillation: Secondary | ICD-10-CM | POA: Diagnosis not present

## 2011-06-08 ENCOUNTER — Ambulatory Visit (INDEPENDENT_AMBULATORY_CARE_PROVIDER_SITE_OTHER): Payer: Medicare Other | Admitting: Cardiovascular Disease

## 2011-06-08 ENCOUNTER — Encounter: Payer: Self-pay | Admitting: Cardiovascular Disease

## 2011-06-08 VITALS — BP 118/76 | HR 60 | Ht 68.0 in | Wt 213.8 lb

## 2011-06-08 DIAGNOSIS — I4891 Unspecified atrial fibrillation: Secondary | ICD-10-CM | POA: Diagnosis not present

## 2011-06-08 MED ORDER — ASPIRIN 325 MG PO TBEC: 325.0000 mg | DELAYED_RELEASE_TABLET | Freq: Every day | ORAL | Status: DC

## 2011-06-08 MED ORDER — ASPIRIN 81 MG PO TBEC: 325.0000 mg | DELAYED_RELEASE_TABLET | Freq: Every day | ORAL | Status: DC

## 2011-06-08 NOTE — Assessment & Plan Note (Signed)
Frank [name] had a brief episode of atrial fibrillation because of to have some presyncope. It resolved with diltiazem and has not had any further palpitations.  He has worn a 30 day event monitor and did not have any recurrent atrial fibrillation.   we will increase his aspirin to 325 mg a day. I've asked him to call me if he has any further problems. I'll see him again in the office in 6 months for an office visit and an EKG.
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
## 2011-06-08 NOTE — Patient Instructions (Signed)
Your physician wants you to follow-up in: 6 months  You will receive a reminder letter in the mail two months in advance. If you don't receive a letter, please call our office to schedule the follow-up appointment.   Your physician has recommended you make the following change in your medication:   START TAKING ASPIRIN AT NEW DOSE OF 325 MG DAILY.

## 2011-07-10 ENCOUNTER — Other Ambulatory Visit: Payer: Self-pay | Admitting: Gastroenterology

## 2011-09-09 ENCOUNTER — Other Ambulatory Visit: Payer: Self-pay | Admitting: Gastroenterology

## 2011-10-11 DIAGNOSIS — H5200 Hypermetropia, unspecified eye: Secondary | ICD-10-CM | POA: Diagnosis not present

## 2011-10-11 DIAGNOSIS — H52209 Unspecified astigmatism, unspecified eye: Secondary | ICD-10-CM | POA: Diagnosis not present

## 2011-11-03 ENCOUNTER — Encounter: Payer: Self-pay | Admitting: *Deleted

## 2011-11-03 DIAGNOSIS — L0390 Cellulitis, unspecified: Secondary | ICD-10-CM | POA: Diagnosis not present

## 2011-11-03 DIAGNOSIS — L0291 Cutaneous abscess, unspecified: Secondary | ICD-10-CM | POA: Diagnosis not present

## 2011-11-04 ENCOUNTER — Other Ambulatory Visit (INDEPENDENT_AMBULATORY_CARE_PROVIDER_SITE_OTHER): Payer: Medicare Other

## 2011-11-04 ENCOUNTER — Ambulatory Visit (INDEPENDENT_AMBULATORY_CARE_PROVIDER_SITE_OTHER): Payer: Medicare Other | Admitting: Gastroenterology

## 2011-11-04 ENCOUNTER — Encounter: Payer: Self-pay | Admitting: Gastroenterology

## 2011-11-04 VITALS — BP 118/76 | HR 60 | Ht 65.5 in | Wt 220.1 lb

## 2011-11-04 DIAGNOSIS — R6889 Other general symptoms and signs: Secondary | ICD-10-CM | POA: Diagnosis not present

## 2011-11-04 DIAGNOSIS — Z79899 Other long term (current) drug therapy: Secondary | ICD-10-CM | POA: Diagnosis not present

## 2011-11-04 DIAGNOSIS — K219 Gastro-esophageal reflux disease without esophagitis: Secondary | ICD-10-CM

## 2011-11-04 LAB — FOLATE: Folate: 21.6 ng/mL (ref >5.9)

## 2011-11-04 MED ORDER — ESOMEPRAZOLE MAGNESIUM 40 MG PO CPDR: 40.0000 mg | DELAYED_RELEASE_CAPSULE | Freq: Every day | ORAL | Status: DC

## 2011-11-04 NOTE — Progress Notes (Signed)
This is a 70 year old Caucasian male with chronic acid reflux managed with Nexium 40 mg a day. He denies dysphagia, anorexia, weight loss, or hepatobiliary complaints. He has not had an endoscopy in over 15 years. He is up-to-date on his colonoscopy, and denies lower gastrointestinal problems, melena or hematochezia. He is recently been evaluated for syncope by cardiology, had a brief episode of atrial fibrillation, and is currently on Cardizem CD 120 mg a day and aspirin 325 mg a day. Patient suffers from recurrent headaches and uses when necessary Anaprox. Family history is noncontributory except for history of mental illness. He had repair of a nonbillable hernia with mesh insertion by Dr. Kirstie Mirza many years ago.  Current Medications, Allergies, Past Medical History, Past Surgical History, Family History and Social History were reviewed in [employer] record.  Pertinent Review of Systems Negative.... no current cardiovascular, pulmonary, or neurological symptoms.   Physical Exam: Blood pressure 118/76, pulse 60 and regular, and weight 220 pounds with BMI of 36.07. I cannot appreciate stigmata of chronic liver disease. His chest shows diminished breath sounds in both lung fields without wheezes or rhonchi. He appears to be in a regular rhythm without murmurs gallops or rubs. There is a moderate-sized ventral hernia noted otherwise no organomegaly, masses or tenderness. Bowel sounds were normal. Mental status is normal.    Assessment and Plan: Chronic GERD-rule out Barrett's mucosa. Because of long-term PPI use I have ordered anemia profile check B12 level. We have renewed his Nexium, reviewed a reflux regime, and have scheduled endoscopic exam. He is to continue other medications as listed above. Review of recent labs shows no other metabolic abnormalities or abnormal blood. Encounter Diagnoses  Name Primary?  . GERD (gastroesophageal reflux disease) Yes  . General symptom     . Other abnormal blood chemistry

## 2011-11-04 NOTE — Patient Instructions (Addendum)
You have been scheduled for an endoscopy and colonoscopy with propofol. Please follow the written instructions given to you at your visit today. Please pick up your prep at the pharmacy within the next 1-3 days. If you use inhalers (even only as needed), please bring them with you on the day of your procedure. Your physician has requested that you go to the basement for the following lab work before leaving today. We have sent the following medications to your pharmacy for you to pick up at your convenience: Nexium.  CC: Sanda Linger, M.D.

## 2011-11-11 DIAGNOSIS — G43019 Migraine without aura, intractable, without status migrainosus: Secondary | ICD-10-CM | POA: Diagnosis not present

## 2011-11-22 ENCOUNTER — Encounter: Payer: Medicare Other | Admitting: Gastroenterology

## 2011-12-13 ENCOUNTER — Encounter: Payer: Self-pay | Admitting: Cardiovascular Disease

## 2011-12-13 ENCOUNTER — Ambulatory Visit (INDEPENDENT_AMBULATORY_CARE_PROVIDER_SITE_OTHER): Payer: Medicare Other | Admitting: Cardiovascular Disease

## 2011-12-13 VITALS — BP 118/80 | HR 58 | Ht 65.5 in | Wt 220.8 lb

## 2011-12-13 DIAGNOSIS — R55 Syncope and collapse: Secondary | ICD-10-CM | POA: Diagnosis not present

## 2011-12-13 DIAGNOSIS — I4891 Unspecified atrial fibrillation: Secondary | ICD-10-CM

## 2011-12-13 NOTE — Patient Instructions (Addendum)
Your physician wants you to follow-up in: 1 year  You will receive a reminder letter in the mail two months in advance. If you don't receive a letter, please call our office to schedule the follow-up appointment.   Your physician recommends that you return for a FASTING lipid profile: 1 year   
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
## 2011-12-13 NOTE — Assessment & Plan Note (Signed)
Frank [name] has not had any further episodes of atrial fibrillation. He has not had any episodes of syncope. He seems to be tolerating the diltiazem quite well.  I'll see him again in one year for a followup visit.

## 2011-12-13 NOTE — Assessment & Plan Note (Signed)
[name] has not had any further episodes of syncope. His rhythm seems to be well-controlled on the low dose diltiazem. We'll continue the same medications. I'll see him again in one year for a followup visit.

## 2011-12-27 ENCOUNTER — Ambulatory Visit (AMBULATORY_SURGERY_CENTER): Payer: Medicare Other | Admitting: Gastroenterology

## 2011-12-27 ENCOUNTER — Encounter: Payer: Self-pay | Admitting: Gastroenterology

## 2011-12-27 VITALS — BP 122/77 | HR 52 | Temp 97.6°F | Resp 14 | Ht 65.0 in | Wt 220.0 lb

## 2011-12-27 DIAGNOSIS — E785 Hyperlipidemia, unspecified: Secondary | ICD-10-CM | POA: Diagnosis not present

## 2011-12-27 DIAGNOSIS — I4891 Unspecified atrial fibrillation: Secondary | ICD-10-CM | POA: Diagnosis not present

## 2011-12-27 DIAGNOSIS — K209 Esophagitis, unspecified: Secondary | ICD-10-CM

## 2011-12-27 DIAGNOSIS — K219 Gastro-esophageal reflux disease without esophagitis: Secondary | ICD-10-CM | POA: Diagnosis not present

## 2011-12-27 DIAGNOSIS — R6889 Other general symptoms and signs: Secondary | ICD-10-CM | POA: Diagnosis not present

## 2011-12-27 DIAGNOSIS — R55 Syncope and collapse: Secondary | ICD-10-CM | POA: Diagnosis not present

## 2011-12-27 DIAGNOSIS — E669 Obesity, unspecified: Secondary | ICD-10-CM | POA: Diagnosis not present

## 2011-12-27 MED ORDER — SODIUM CHLORIDE 0.9 % IV SOLN: 500.0000 mL | INTRAVENOUS | Status: DC

## 2011-12-27 NOTE — Progress Notes (Signed)
Patient did not experience any of the following events: a burn prior to discharge; a fall within the facility; wrong site/side/patient/procedure/implant event; or a hospital transfer or hospital admission upon discharge from the facility. (G8907) Patient did not have preoperative order for IV antibiotic SSI prophylaxis. (G8918)  

## 2011-12-27 NOTE — Patient Instructions (Addendum)
Findings:  Irregular z line biopsied r/o barretts, treated GERD Recommendations:  Continue current meds, wait for pathology results  YOU HAD AN ENDOSCOPIC PROCEDURE TODAY AT THE [facility]: Refer to the procedure report that was given to you for any specific questions about what was found during the examination.  If the procedure report does not answer your questions, please call your gastroenterologist to clarify.  If you requested that your care partner not be given the details of your procedure findings, then the procedure report has been included in a sealed envelope for you to review at your convenience later.  YOU SHOULD EXPECT: Some feelings of bloating in the abdomen. Passage of more gas than usual.  Walking can help get rid of the air that was put into your GI tract during the procedure and reduce the bloating. If you had a lower endoscopy (such as a colonoscopy or flexible sigmoidoscopy) you may notice spotting of blood in your stool or on the toilet paper. If you underwent a bowel prep for your procedure, then you may not have a normal bowel movement for a few days.  DIET: Your first meal following the procedure should be a light meal and then it is ok to progress to your normal diet.  A half-sandwich or bowl of soup is an example of a good first meal.  Heavy or fried foods are harder to digest and may make you feel nauseous or bloated.  Likewise meals heavy in dairy and vegetables can cause extra gas to form and this can also increase the bloating.  Drink plenty of fluids but you should avoid alcoholic beverages for 24 hours.  ACTIVITY: Your care partner should take you home directly after the procedure.  You should plan to take it easy, moving slowly for the rest of the day.  You can resume normal activity the day after the procedure however you should NOT DRIVE or use heavy machinery for 24 hours (because of the sedation medicines used during the test).    SYMPTOMS TO REPORT  IMMEDIATELY: A gastroenterologist can be reached at any hour.  During normal business hours, 8:30 AM to 5:00 PM Monday through Friday, call [phone].  After hours and on weekends, please call the GI answering service at [phone] who will take a message and have the physician on call contact you.   Following lower endoscopy (colonoscopy or flexible sigmoidoscopy):  Excessive amounts of blood in the stool  Significant tenderness or worsening of abdominal pains  Swelling of the abdomen that is new, acute  Fever of 100F or higher  Following upper endoscopy (EGD)  Vomiting of blood or coffee ground material  New chest pain or pain under the shoulder blades  Painful or persistently difficult swallowing  New shortness of breath  Fever of 100F or higher  Black, tarry-looking stools  FOLLOW UP: If any biopsies were taken you will be contacted by phone or by letter within the next 1-3 weeks.  Call your gastroenterologist if you have not heard about the biopsies in 3 weeks.  Our staff will call the home number listed on your records the next business day following your procedure to check on you and address any questions or concerns that you may have at that time regarding the information given to you following your procedure. This is a courtesy call and so if there is no answer at the home number and we have not heard from you through the emergency physician on call,  we will assume that you have returned to your regular daily activities without incident.  SIGNATURES/CONFIDENTIALITY: You and/or your care partner have signed paperwork which will be entered into your electronic medical record.  These signatures attest to the fact that that the information above on your After Visit Summary has been reviewed and is understood.  Full responsibility of the confidentiality of this discharge information lies with you and/or your care-partner.   Please follow all discharge instructions given to you  by the recovery room nurse. If you have any questions or problems after discharge please call one of the numbers listed above. You will receive a phone call in the am to see how you are doing and answer any questions you may have. Thank you for choosing [facility] for your health care needs.

## 2011-12-27 NOTE — Progress Notes (Signed)
The pt tolerated the egd very well. Maw   

## 2011-12-27 NOTE — Op Note (Signed)
[facility] [address] [place], [address]   ENDOSCOPY PROCEDURE REPORT  PATIENT: Frank, [name].  MR#: [id number] BIRTHDATE: 03/11/1940 , 70  yrs. old GENDER: Male ENDOSCOPIST:Sindhu Nguyen Hale Bogus, MD, [facility] REFERRED BY: PROCEDURE DATE:  12/27/2011 PROCEDURE:   EGD w/ biopsy ASA CLASS:    Class II INDICATIONS: history of GERD. MEDICATION: Propofol (Diprivan) 180 mg IV TOPICAL ANESTHETIC:   Cetacaine Spray  DESCRIPTION OF PROCEDURE:   After the risks and benefits of the procedure were explained, informed consent was obtained.  [facility] [id number]  endoscope was introduced through the mouth  and advanced to the    .  The instrument was slowly withdrawn as the mucosa was fully examined.        ESOPHAGUS: Multiple biopsies were performed. Irregular Z-line biopsied.4 cm. HH noted.  STOMACH: The mucosa of the stomach appeared normal.  DUODENUM: The duodenal mucosa showed no abnormalities. Retroflexed views revealed a hiatal hernia.    The scope was then withdrawn from the patient and the procedure completed.  COMPLICATIONS: There were no complications.   ENDOSCOPIC IMPRESSION: 1.   Irregular Z-line biopsied ...r/o Barrett's mucosa,treated GERD 3.   The mucosa of the stomach appeared normal 4.   The duodenal mucosa showed no abnormalities  RECOMMENDATIONS: 1.  continue current meds 2.  await pathology results    _______________________________ eSigned:  Mardella Layman, MD, [facility] 12/27/2011 9:19 AM   standard discharge

## 2011-12-28 ENCOUNTER — Telehealth: Payer: Self-pay | Admitting: *Deleted

## 2011-12-28 DIAGNOSIS — M6530 Trigger finger, unspecified finger: Secondary | ICD-10-CM | POA: Diagnosis not present

## 2011-12-28 NOTE — Telephone Encounter (Signed)
  Follow up Call-  Call back number 12/27/2011  Post procedure Call Back phone  # [phone]  Permission to leave phone message Yes     Patient questions:  Do you have a fever, pain , or abdominal swelling? no Pain Score  0 *  Have you tolerated food without any problems? yes  Have you been able to return to your normal activities? yes  Do you have any questions about your discharge instructions: Diet   no Medications  no Follow up visit  no  Do you have questions or concerns about your Care? no  Actions: * If pain score is 4 or above: No action needed, pain <4.

## 2011-12-31 ENCOUNTER — Encounter: Payer: Self-pay | Admitting: Gastroenterology

## 2012-02-21 DIAGNOSIS — Z23 Encounter for immunization: Secondary | ICD-10-CM | POA: Diagnosis not present

## 2012-05-06 ENCOUNTER — Other Ambulatory Visit: Payer: Self-pay | Admitting: Nurse Practitioner

## 2012-09-21 ENCOUNTER — Other Ambulatory Visit: Payer: Self-pay | Admitting: Nurse Practitioner

## 2012-09-21 ENCOUNTER — Encounter: Payer: Self-pay | Admitting: Internal Medicine

## 2012-09-21 ENCOUNTER — Ambulatory Visit (INDEPENDENT_AMBULATORY_CARE_PROVIDER_SITE_OTHER): Payer: Medicare Other | Admitting: Internal Medicine

## 2012-09-21 VITALS — BP 110/70 | HR 72 | Temp 98.0°F | Resp 16 | Wt 228.0 lb

## 2012-09-21 DIAGNOSIS — R209 Unspecified disturbances of skin sensation: Secondary | ICD-10-CM

## 2012-09-21 DIAGNOSIS — R200 Anesthesia of skin: Secondary | ICD-10-CM

## 2012-09-21 DIAGNOSIS — T300 Burn of unspecified body region, unspecified degree: Secondary | ICD-10-CM

## 2012-09-21 DIAGNOSIS — G5712 Meralgia paresthetica, left lower limb: Secondary | ICD-10-CM | POA: Insufficient documentation

## 2012-09-21 MED ORDER — MEDERMA EX GEL: 1.0000 "application " | Freq: Every day | CUTANEOUS | Status: DC

## 2012-09-21 NOTE — Patient Instructions (Signed)

## 2012-09-21 NOTE — Progress Notes (Signed)
Subjective:    Patient ID: Frank [name], male    DOB: 1940/08/30, 72 y.o.   MRN: [id number]  HPI  He burnt his right forearm about 3 weeks ago and comes in today to have it checked out. He feels like it has healed well but he is concerned about the scar. Also, he complains of intermittent episodes of numbness over his left thigh for the last few weeks. This happened previously about 6-7 years ago and went away spontaneously.   Review of Systems  Constitutional: Negative.   HENT: Negative.   Eyes: Negative.   Respiratory: Negative.  Negative for cough, chest tightness, shortness of breath, wheezing and stridor.   Cardiovascular: Negative.  Negative for chest pain, palpitations and leg swelling.  Gastrointestinal: Negative.  Negative for nausea, vomiting, abdominal pain, constipation and blood in stool.  Endocrine: Negative.   Genitourinary: Negative.   Musculoskeletal: Negative.  Negative for back pain, joint swelling and gait problem.  Skin: Positive for wound. Negative for color change, pallor and rash.  Allergic/Immunologic: Negative.   Neurological: Positive for numbness. Negative for dizziness, tremors, seizures, syncope, speech difficulty, weakness, light-headedness and headaches.  Hematological: Negative.  Negative for adenopathy. Does not bruise/bleed easily.  Psychiatric/Behavioral: Negative.        Objective:   Physical Exam  Vitals reviewed. Constitutional: He is oriented to person, place, and time. He appears well-developed and well-nourished. No distress.  HENT:  Head: Normocephalic and atraumatic.  Mouth/Throat: Oropharynx is clear and moist. No oropharyngeal exudate.  Eyes: Conjunctivae are normal. Right eye exhibits no discharge. Left eye exhibits no discharge. No scleral icterus.  Neck: Normal range of motion. Neck supple. No JVD present. No tracheal deviation present. No thyromegaly present.  Cardiovascular: Normal rate, regular rhythm, normal heart sounds and  intact distal pulses.  Exam reveals no gallop and no friction rub.   No murmur heard. Pulmonary/Chest: Effort normal and breath sounds normal. No stridor. No respiratory distress. He has no wheezes. He has no rales. He exhibits no tenderness.  Abdominal: Soft. Bowel sounds are normal. He exhibits no distension and no mass. There is no tenderness. There is no rebound and no guarding.  Musculoskeletal: Normal range of motion. He exhibits no edema and no tenderness.  Lymphadenopathy:    He has no cervical adenopathy.  Neurological: He is alert and oriented to person, place, and time. He has normal strength. He displays no atrophy, no tremor and normal reflexes. No cranial nerve deficit or sensory deficit. He exhibits normal muscle tone. He displays a negative Romberg sign. He displays no seizure activity. Coordination and gait normal. He displays no Babinski's sign on the right side. He displays no Babinski's sign on the left side.  Reflex Scores:      Tricep reflexes are 1+ on the right side and 1+ on the left side.      Bicep reflexes are 1+ on the right side and 1+ on the left side.      Brachioradialis reflexes are 1+ on the right side and 1+ on the left side.      Patellar reflexes are 1+ on the right side and 1+ on the left side.      Achilles reflexes are 1+ on the right side and 1+ on the left side. Skin: Skin is warm and dry. Burn noted. No abrasion, no bruising, no ecchymosis, no laceration, no lesion, no petechiae and no rash noted. He is not diaphoretic. No erythema. No pallor.  Psychiatric: He has a normal mood and affect. His behavior is normal. Judgment and thought content normal.     Lab Results  Component Value Date   WBC 6.8 04/17/2011   HGB 14.8 04/17/2011   HCT 42.0 04/17/2011   PLT 171 04/17/2011   GLUCOSE 103* 04/16/2011   CHOL 168 04/16/2011   TRIG 124 04/16/2011   HDL 31* 04/16/2011   LDLDIRECT 79.8 05/18/2010   LDLCALC 112* 04/16/2011   ALT 25 05/18/2010   AST 22  05/18/2010   NA 142 04/16/2011   K 3.6 04/16/2011   CL 108 04/16/2011   CREATININE 0.78 04/16/2011   BUN 9 04/16/2011   CO2 23 04/16/2011   TSH 1.744 04/15/2011   PSA 0.19 01/19/2010   INR 1.00 04/15/2011   HGBA1C 5.5 04/16/2011       Assessment & Plan:

## 2012-09-24 ENCOUNTER — Encounter: Payer: Self-pay | Admitting: Internal Medicine

## 2012-09-24 NOTE — Assessment & Plan Note (Signed)
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 I have asked him to have a NCS and EMG done to check the severity and to screen for other causes of numbess (lumbar radiculopathy, MS, etc.)

## 2012-09-24 NOTE — Assessment & Plan Note (Signed)
He will try mederma for the scar

## 2012-10-05 ENCOUNTER — Ambulatory Visit (INDEPENDENT_AMBULATORY_CARE_PROVIDER_SITE_OTHER): Payer: Medicare Other | Admitting: Neurology

## 2012-10-05 ENCOUNTER — Ambulatory Visit (INDEPENDENT_AMBULATORY_CARE_PROVIDER_SITE_OTHER): Payer: Medicare Other

## 2012-10-05 DIAGNOSIS — G5712 Meralgia paresthetica, left lower limb: Secondary | ICD-10-CM

## 2012-10-05 DIAGNOSIS — R209 Unspecified disturbances of skin sensation: Secondary | ICD-10-CM

## 2012-10-05 DIAGNOSIS — G5710 Meralgia paresthetica, unspecified lower limb: Secondary | ICD-10-CM

## 2012-10-05 DIAGNOSIS — Z0289 Encounter for other administrative examinations: Secondary | ICD-10-CM

## 2012-10-05 NOTE — Procedures (Signed)
  HISTORY:  Frank [name] is a 72 year old gentleman with a 3 week history of numbness and tingling sensations and discomfort involving the anterolateral thigh on the left. The patient reports no back pain or weakness of the left leg. The patient has no symptoms involving the right leg. The patient is being evaluated for a possible neuropathy or a lumbosacral radiculopathy.  NERVE CONDUCTION STUDIES:  Nerve conduction studies were performed on both lower extremities. The distal motor latencies and motor amplitudes for the peroneal and posterior tibial nerves were within normal limits. The nerve conduction velocities for these nerves were also normal. The H reflex latencies were normal. The sensory latencies for the peroneal nerves were within normal limits. The saphenous sensory latencies were unobtainable bilaterally.    EMG STUDIES:  EMG study was performed on the left lower extremity:  The tibialis anterior muscle reveals 2 to 4K motor units with full recruitment. No fibrillations or positive waves were seen. The peroneus tertius muscle reveals 2 to 4K motor units with full recruitment. No fibrillations or positive waves were seen. The medial gastrocnemius muscle reveals 1 to 3K motor units with full recruitment. No fibrillations or positive waves were seen. The vastus lateralis muscle reveals 2 to 4K motor units with full recruitment. No fibrillations or positive waves were seen. The iliopsoas muscle reveals 2 to 5K motor units with full recruitment. No fibrillations or positive waves were seen. The biceps femoris muscle (long head) reveals 2 to 4K motor units with full recruitment. No fibrillations or positive waves were seen. The lumbosacral paraspinal muscles were tested at 3 levels, and revealed no abnormalities of insertional activity at all 3 levels tested. There was good relaxation.    IMPRESSION:  Nerve conduction studies done on both lower extremities were essentially  unremarkable. The saphenous sensory latencies were unobtainable, but the study was technically difficult. There is no clear evidence of a peripheral neuropathy. EMG evaluation of the left lower extremity was unremarkable, without evidence of an overlying lumbosacral radiculopathy. The sensory pattern described by the patient is consistent with a left lateral femoral cutaneous neuropathy, meralgia paresthetica.  Marlan Palau MD 10/05/2012 1:49 PM  [facility] [address] [place], [place] [address]  Phone [phone] Fax [phone]

## 2012-10-24 ENCOUNTER — Ambulatory Visit (INDEPENDENT_AMBULATORY_CARE_PROVIDER_SITE_OTHER): Payer: Medicare Other | Admitting: Internal Medicine

## 2012-10-24 ENCOUNTER — Encounter: Payer: Self-pay | Admitting: Internal Medicine

## 2012-10-24 VITALS — BP 136/80 | HR 56 | Temp 97.8°F | Resp 16 | Wt 224.0 lb

## 2012-10-24 DIAGNOSIS — G5712 Meralgia paresthetica, left lower limb: Secondary | ICD-10-CM

## 2012-10-24 DIAGNOSIS — G5710 Meralgia paresthetica, unspecified lower limb: Secondary | ICD-10-CM

## 2012-10-24 NOTE — Progress Notes (Signed)
  Subjective:    Patient ID: Frank [name], male    DOB: 02-Jan-1941, 72 y.o.   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 He has lost a few pounds and tells me that it is getting better. He does not wish to have any additional treatment at this time.  Review of Systems  All other systems reviewed and are negative.       Objective:   Physical Exam  Vitals reviewed. Constitutional: He is oriented to person, place, and time. He appears well-developed and well-nourished. No distress.  HENT:  Head: Normocephalic and atraumatic.  Mouth/Throat: Oropharynx is clear and moist. No oropharyngeal exudate.  Eyes: Conjunctivae are normal. Right eye exhibits no discharge. Left eye exhibits no discharge. No scleral icterus.  Neck: Normal range of motion. Neck supple. No JVD present. No tracheal deviation present. No thyromegaly present.  Cardiovascular: Normal rate, regular rhythm, normal heart sounds and intact distal pulses.  Exam reveals no gallop and no friction rub.   No murmur heard. Pulmonary/Chest: Effort normal and breath sounds normal. No stridor. No respiratory distress. He has no wheezes. He has no rales. He exhibits no tenderness.  Abdominal: Soft. Bowel sounds are normal. He exhibits no distension and no mass. There is no tenderness. There is no rebound and no guarding.  Musculoskeletal: Normal range of motion. He exhibits no edema and no tenderness.  Lymphadenopathy:    He has no cervical adenopathy.  Neurological: He is oriented to person, place, and time. He has normal strength. He displays no atrophy, no tremor and normal reflexes. No cranial nerve deficit or sensory deficit. He exhibits normal muscle tone. He displays a negative Romberg sign. He displays no seizure activity. Coordination and gait normal.  Reflex Scores:      Tricep reflexes are 0 on the left side.  Bicep reflexes are 0 on the right side and 0 on the left side.      Brachioradialis reflexes are 0 on the right side and 0 on the left side.      Patellar reflexes are 0 on the right side and 0 on the left side.      Achilles reflexes are 0 on the right side and 0 on the left side. Skin: Skin is warm and dry. No rash noted. He is not diaphoretic. No erythema. No pallor.  Psychiatric: He has a normal mood and affect. His behavior is normal. Judgment and thought content normal.          Assessment & Plan:

## 2012-10-24 NOTE — Patient Instructions (Addendum)
Meralgia Paresthetica  Meralgia paresthetica (MP) is a disorder characterized by tingling, numbness, and burning pain in the outer side of the thigh. It occurs in men more than women. MP is generally found in middle-aged or overweight people. Sometimes, the disorder may disappear. CAUSES The disorder is caused by a nerve in the thigh being squeezed (compressed). MP may be associated with tight clothing, pregnancy, diabetes, and being overweight (obese). SYMPTOMS  Tingling, numbness, and burning in the outer thigh.  An area of the skin may be painful and sensitive to the touch. The symptoms often worsen after walking or standing. TREATMENT  Treatment is based on your symptoms and is mainly supportive. Treatment may include:  Wearing looser clothing.  Losing weight.  Avoiding prolonged standing or walking.  Taking medication.  Surgery if the pain is peristent or severe. MP usually eases or disappears after treatment. Surgery is not always fully successful. Document Released: 02/05/2002 Document Revised: 05/10/2011 Document Reviewed: 02/15/2005 [employer] Patient Information 2014 [employer].  

## 2012-10-24 NOTE — Assessment & Plan Note (Signed)
He was give pt ed material and will continue to try lifestyle modifications and weight loss

## 2012-11-07 DIAGNOSIS — G43019 Migraine without aura, intractable, without status migrainosus: Secondary | ICD-10-CM | POA: Diagnosis not present

## 2012-11-11 ENCOUNTER — Other Ambulatory Visit: Payer: Self-pay | Admitting: Gastroenterology

## 2012-11-13 NOTE — Telephone Encounter (Signed)
PLEASE MAKE AN OFFICE VISIT FOR FURTHER REFILLS  

## 2012-11-28 ENCOUNTER — Other Ambulatory Visit (INDEPENDENT_AMBULATORY_CARE_PROVIDER_SITE_OTHER): Payer: Medicare Other

## 2012-11-28 ENCOUNTER — Ambulatory Visit (INDEPENDENT_AMBULATORY_CARE_PROVIDER_SITE_OTHER): Payer: Medicare Other | Admitting: Internal Medicine

## 2012-11-28 ENCOUNTER — Encounter: Payer: Self-pay | Admitting: Internal Medicine

## 2012-11-28 VITALS — BP 126/80 | HR 46 | Temp 97.0°F | Resp 16 | Ht 65.0 in | Wt 221.5 lb

## 2012-11-28 DIAGNOSIS — E785 Hyperlipidemia, unspecified: Secondary | ICD-10-CM | POA: Diagnosis not present

## 2012-11-28 DIAGNOSIS — R7309 Other abnormal glucose: Secondary | ICD-10-CM

## 2012-11-28 DIAGNOSIS — Z23 Encounter for immunization: Secondary | ICD-10-CM | POA: Diagnosis not present

## 2012-11-28 DIAGNOSIS — E781 Pure hyperglyceridemia: Secondary | ICD-10-CM

## 2012-11-28 DIAGNOSIS — Z0000 Encounter for general adult medical examination without abnormal findings: Secondary | ICD-10-CM | POA: Insufficient documentation

## 2012-11-28 DIAGNOSIS — N401 Enlarged prostate with lower urinary tract symptoms: Secondary | ICD-10-CM | POA: Diagnosis not present

## 2012-11-28 DIAGNOSIS — I4891 Unspecified atrial fibrillation: Secondary | ICD-10-CM

## 2012-11-28 HISTORY — DX: Hyperlipidemia, unspecified: E78.5

## 2012-11-28 LAB — URINALYSIS, ROUTINE W REFLEX MICROSCOPIC
Bilirubin Urine: NEGATIVE
Hgb urine dipstick: NEGATIVE
Ketones, ur: NEGATIVE
Leukocytes, UA: NEGATIVE
Urine Glucose: NEGATIVE
Urobilinogen, UA: 0.2 (ref 0.0–1.0)
pH: 6.5 (ref 5.0–8.0)

## 2012-11-28 LAB — CBC WITH DIFFERENTIAL/PLATELET
Basophils Absolute: 0 10*3/uL (ref 0.0–0.1)
Basophils Relative: 0.2 % (ref 0.0–3.0)
Eosinophils Absolute: 0.1 10*3/uL (ref 0.0–0.7)
Eosinophils Relative: 0.8 % (ref 0.0–5.0)
Hemoglobin: 15.5 g/dL (ref 13.0–17.0)
Lymphocytes Relative: 22.2 % (ref 12.0–46.0)
Lymphs Abs: 1.6 10*3/uL (ref 0.7–4.0)
MCHC: 34.5 g/dL (ref 30.0–36.0)
MCV: 96.3 fl (ref 78.0–100.0)
Monocytes Absolute: 0.5 10*3/uL (ref 0.1–1.0)
Monocytes Relative: 7.2 % (ref 3.0–12.0)
Neutro Abs: 5.1 10*3/uL (ref 1.4–7.7)
RBC: 4.66 Mil/uL (ref 4.22–5.81)
RDW: 14 % (ref 11.5–14.6)
WBC: 7.4 10*3/uL (ref 4.5–10.5)

## 2012-11-28 LAB — COMPREHENSIVE METABOLIC PANEL
ALT: 31 U/L (ref 0–53)
AST: 23 U/L (ref 0–37)
Alkaline Phosphatase: 64 U/L (ref 39–117)
BUN: 11 mg/dL (ref 6–23)
Calcium: 8.9 mg/dL (ref 8.4–10.5)
Chloride: 104 mEq/L (ref 96–112)
Creatinine, Ser: 0.8 mg/dL (ref 0.4–1.5)
Potassium: 3.9 mEq/L (ref 3.5–5.1)
Total Protein: 6.7 g/dL (ref 6.0–8.3)

## 2012-11-28 LAB — TSH: TSH: 1.56 u[IU]/mL (ref 0.35–5.50)

## 2012-11-28 LAB — LIPID PANEL
HDL: 28.2 mg/dL — ABNORMAL LOW (ref >39.00)
Total CHOL/HDL Ratio: 6

## 2012-11-28 LAB — FECAL OCCULT BLOOD, GUAIAC: Fecal Occult Blood: NEGATIVE

## 2012-11-28 LAB — HEMOGLOBIN A1C: Hgb A1c MFr Bld: 5.5 % (ref 4.6–6.5)

## 2012-11-28 LAB — PSA: PSA: 0.14 ng/mL (ref 0.10–4.00)

## 2012-11-28 MED ORDER — PRAVASTATIN SODIUM 40 MG PO TABS: 40.0000 mg | ORAL_TABLET | Freq: Every day | ORAL | Status: DC

## 2012-11-28 NOTE — Progress Notes (Signed)
Subjective:    Patient ID: Frank [name], male    DOB: [birth year], 72 y.o.   MRN: [id number]  Hyperlipidemia This is a chronic problem. The current episode started more than 1 year ago. The problem is uncontrolled. Recent lipid tests were reviewed and are variable. Exacerbating diseases include obesity. He has no history of chronic renal disease, diabetes, hypothyroidism, liver disease or nephrotic syndrome. Factors aggravating his hyperlipidemia include fatty foods. Pertinent negatives include no chest pain, focal sensory loss, focal weakness, leg pain, myalgias or shortness of breath. Current antihyperlipidemic treatment includes diet change and exercise. The current treatment provides moderate improvement of lipids. Compliance problems include adherence to exercise, adherence to diet and psychosocial issues.       Review of Systems  Constitutional: Negative.  Negative for fever, chills, diaphoresis, activity change, appetite change and fatigue.  HENT: Negative.   Eyes: Negative.   Respiratory: Negative.  Negative for cough, choking, chest tightness, shortness of breath, wheezing and stridor.   Cardiovascular: Negative.  Negative for chest pain, palpitations and leg swelling.  Gastrointestinal: Negative.  Negative for nausea, vomiting, abdominal pain, diarrhea, constipation and blood in stool.  Endocrine: Negative.  Negative for polydipsia, polyphagia and polyuria.  Genitourinary: Negative.  Negative for dysuria, urgency, frequency, flank pain, decreased urine volume, enuresis, difficulty urinating and testicular pain.  Musculoskeletal: Negative.  Negative for myalgias, back pain, joint swelling, arthralgias and gait problem.  Skin: Negative.   Allergic/Immunologic: Negative.   Neurological: Negative.  Negative for focal weakness.       Numbness in left thigh is improving  Hematological: Negative.  Negative for adenopathy. Does not bruise/bleed easily.  Psychiatric/Behavioral: Negative.         Objective:   Physical Exam  Vitals reviewed. Constitutional: He is oriented to person, place, and time. He appears well-developed and well-nourished.  Non-toxic appearance. He does not have a sickly appearance. He does not appear ill. No distress.  HENT:  Head: Normocephalic and atraumatic.  Mouth/Throat: Oropharynx is clear and moist. No oropharyngeal exudate.  Eyes: Conjunctivae are normal. Right eye exhibits no discharge. Left eye exhibits no discharge. No scleral icterus.  Neck: Normal range of motion. Neck supple. No JVD present. No tracheal deviation present. No thyromegaly present.  Cardiovascular: Normal rate, regular rhythm, normal heart sounds and intact distal pulses.  Exam reveals no gallop and no friction rub.   No murmur heard. Pulmonary/Chest: Effort normal and breath sounds normal. No stridor. No respiratory distress. He has no wheezes. He has no rales. He exhibits no tenderness.  Abdominal: Soft. Bowel sounds are normal. He exhibits no distension and no mass. There is no tenderness. There is no rebound and no guarding. Hernia confirmed negative in the right inguinal area and confirmed negative in the left inguinal area.  Genitourinary: Rectum normal, prostate normal, testes normal and penis normal. Rectal exam shows no external hemorrhoid, no internal hemorrhoid, no fissure, no mass, no tenderness and anal tone normal. Guaiac negative stool. Prostate is not enlarged and not tender. Right testis shows no mass, no swelling and no tenderness. Right testis is descended. Left testis shows no mass, no swelling and no tenderness. Left testis is descended. Circumcised. No penile erythema or penile tenderness. No discharge found.  Musculoskeletal: Normal range of motion. He exhibits no edema and no tenderness.  Lymphadenopathy:    He has no cervical adenopathy.       Right: No inguinal adenopathy present.       Left: No inguinal adenopathy present.  Neurological: He is alert and  oriented to person, place, and time. He has normal reflexes. He displays normal reflexes. No cranial nerve deficit. He exhibits normal muscle tone. Coordination normal.  Skin: Skin is warm and dry. No rash noted. He is not diaphoretic. No erythema. No pallor.  Psychiatric: He has a normal mood and affect. His behavior is normal. Judgment and thought content normal.     Lab Results  Component Value Date   WBC 6.8 04/17/2011   HGB 14.8 04/17/2011   HCT 42.0 04/17/2011   PLT 171 04/17/2011   GLUCOSE 103* 04/16/2011   CHOL 168 04/16/2011   TRIG 124 04/16/2011   HDL 31* 04/16/2011   LDLDIRECT 79.8 05/18/2010   LDLCALC 112* 04/16/2011   ALT 25 05/18/2010   AST 22 05/18/2010   NA 142 04/16/2011   K 3.6 04/16/2011   CL 108 04/16/2011   CREATININE 0.78 04/16/2011   BUN 9 04/16/2011   CO2 23 04/16/2011   TSH 1.744 04/15/2011   PSA 0.19 01/19/2010   INR 1.00 04/15/2011   HGBA1C 5.5 04/16/2011        Assessment & Plan:

## 2012-11-28 NOTE — Assessment & Plan Note (Signed)
FLP today 

## 2012-11-28 NOTE — Assessment & Plan Note (Signed)
I will check his A1C to see if he has developed DM2 

## 2012-11-28 NOTE — Assessment & Plan Note (Signed)
I will check his PSA today 

## 2012-11-28 NOTE — Assessment & Plan Note (Signed)
He has good rate and rhythm control today. 

## 2012-11-28 NOTE — Patient Instructions (Signed)
Hypertriglyceridemia  Diet for High blood levels of Triglycerides Most fats in food are triglycerides. Triglycerides in your blood are stored as fat in your body. High levels of triglycerides in your blood may put you at a greater risk for heart disease and stroke.  Normal triglyceride levels are less than 150 mg/dL. Borderline high levels are 150-199 mg/dl. High levels are 200 - 499 mg/dL, and very high triglyceride levels are greater than 500 mg/dL. The decision to treat high triglycerides is generally based on the level. For people with borderline or high triglyceride levels, treatment includes weight loss and exercise. Drugs are recommended for people with very high triglyceride levels. Many people who need treatment for high triglyceride levels have metabolic syndrome. This syndrome is a collection of disorders that often include: insulin resistance, high blood pressure, blood clotting problems, high cholesterol and triglycerides. TESTING PROCEDURE FOR TRIGLYCERIDES  You should not eat 4 hours before getting your triglycerides measured. The normal range of triglycerides is between 10 and 250 milligrams per deciliter (mg/dl). Some people may have extreme levels (1000 or above), but your triglyceride level may be too high if it is above 150 mg/dl, depending on what other risk factors you have for heart disease.  People with high blood triglycerides may also have high blood cholesterol levels. If you have high blood cholesterol as well as high blood triglycerides, your risk for heart disease is probably greater than if you only had high triglycerides. High blood cholesterol is one of the main risk factors for heart disease. CHANGING YOUR DIET  Your weight can affect your blood triglyceride level. If you are more than 20% above your ideal body weight, you may be able to lower your blood triglycerides by losing weight. Eating less and exercising regularly is the best way to combat this. Fat provides more  calories than any other food. The best way to lose weight is to eat less fat. Only 30% of your total calories should come from fat. Less than 7% of your diet should come from saturated fat. A diet low in fat and saturated fat is the same as a diet to decrease blood cholesterol. By eating a diet lower in fat, you may lose weight, lower your blood cholesterol, and lower your blood triglyceride level.  Eating a diet low in fat, especially saturated fat, may also help you lower your blood triglyceride level. Ask your dietitian to help you figure how much fat you can eat based on the number of calories your caregiver has prescribed for you.  Exercise, in addition to helping with weight loss may also help lower triglyceride levels.   Alcohol can increase blood triglycerides. You may need to stop drinking alcoholic beverages.  Too much carbohydrate in your diet may also increase your blood triglycerides. Some complex carbohydrates are necessary in your diet. These may include bread, rice, potatoes, other starchy vegetables and cereals.  Reduce "simple" carbohydrates. These may include pure sugars, candy, honey, and jelly without losing other nutrients. If you have the kind of high blood triglycerides that is affected by the amount of carbohydrates in your diet, you will need to eat less sugar and less high-sugar foods. Your caregiver can help you with this.  Adding 2-4 grams of fish oil (EPA+ DHA) may also help lower triglycerides. Speak with your caregiver before adding any supplements to your regimen. Following the Diet  Maintain your ideal weight. Your caregivers can help you with a diet. Generally, eating less food and getting more   exercise will help you lose weight. Joining a weight control group may also help. Ask your caregivers for a good weight control group in your area.  Eat low-fat foods instead of high-fat foods. This can help you lose weight too.  These foods are lower in fat. Eat MORE of these:    Dried beans, peas, and lentils.  Egg whites.  Low-fat cottage cheese.  Fish.  Lean cuts of meat, such as round, sirloin, rump, and flank (cut extra fat off meat you fix).  Whole grain breads, cereals and pasta.  Skim and nonfat dry milk.  Low-fat yogurt.  Poultry without the skin.  Cheese made with skim or part-skim milk, such as mozzarella, parmesan, farmers', ricotta, or pot cheese. These are higher fat foods. Eat LESS of these:   Whole milk and foods made from whole milk, such as American, blue, cheddar, monterey jack, and swiss cheese  High-fat meats, such as luncheon meats, sausages, knockwurst, bratwurst, hot dogs, ribs, corned beef, ground pork, and regular ground beef.  Fried foods. Limit saturated fats in your diet. Substituting unsaturated fat for saturated fat may decrease your blood triglyceride level. You will need to read package labels to know which products contain saturated fats.  These foods are high in saturated fat. Eat LESS of these:   Fried pork skins.  Whole milk.  Skin and fat from poultry.  Palm oil.  Butter.  Shortening.  Cream cheese.  Bacon.  Margarines and baked goods made from listed oils.  Vegetable shortenings.  Chitterlings.  Fat from meats.  Coconut oil.  Palm kernel oil.  Lard.  Cream.  Sour cream.  Fatback.  Coffee whiteners and non-dairy creamers made with these oils.  Cheese made from whole milk. Use unsaturated fats (both polyunsaturated and monounsaturated) moderately. Remember, even though unsaturated fats are better than saturated fats; you still want a diet low in total fat.  These foods are high in unsaturated fat:   Canola oil.  Sunflower oil.  Mayonnaise.  Almonds.  Peanuts.  Pine nuts.  Margarines made with these oils.  Safflower oil.  Olive oil.  Avocados.  Cashews.  Peanut butter.  Sunflower seeds.  Soybean oil.  Peanut  oil.  Olives.  Pecans.  Walnuts.  Pumpkin seeds. Avoid sugar and other high-sugar foods. This will decrease carbohydrates without decreasing other nutrients. Sugar in your food goes rapidly to your blood. When there is excess sugar in your blood, your liver may use it to make more triglycerides. Sugar also contains calories without other important nutrients.  Eat LESS of these:   Sugar, brown sugar, powdered sugar, jam, jelly, preserves, honey, syrup, molasses, pies, candy, cakes, cookies, frosting, pastries, colas, soft drinks, punches, fruit drinks, and regular gelatin.  Avoid alcohol. Alcohol, even more than sugar, may increase blood triglycerides. In addition, alcohol is high in calories and low in nutrients. Ask for sparkling water, or a diet soft drink instead of an alcoholic beverage. Suggestions for planning and preparing meals   Bake, broil, grill or roast meats instead of frying.  Remove fat from meats and skin from poultry before cooking.  Add spices, herbs, lemon juice or vinegar to vegetables instead of salt, rich sauces or gravies.  Use a non-stick skillet without fat or use no-stick sprays.  Cool and refrigerate stews and broth. Then remove the hardened fat floating on the surface before serving.  Refrigerate meat drippings and skim off fat to make low-fat gravies.  Serve more fish.  Use less butter,   margarine and other high-fat spreads on bread or vegetables.  Use skim or reconstituted non-fat dry milk for cooking.  Cook with low-fat cheeses.  Substitute low-fat yogurt or cottage cheese for all or part of the sour cream in recipes for sauces, dips or congealed salads.  Use half yogurt/half mayonnaise in salad recipes.  Substitute evaporated skim milk for cream. Evaporated skim milk or reconstituted non-fat dry milk can be whipped and substituted for whipped cream in certain recipes.  Choose fresh fruits for dessert instead of high-fat foods such as pies or  cakes. Fruits are naturally low in fat. When Dining Out   Order low-fat appetizers such as fruit or vegetable juice, pasta with vegetables or tomato sauce.  Select clear, rather than cream soups.  Ask that dressings and gravies be served on the side. Then use less of them.  Order foods that are baked, broiled, poached, steamed, stir-fried, or roasted.  Ask for margarine instead of butter, and use only a small amount.  Drink sparkling water, unsweetened tea or coffee, or diet soft drinks instead of alcohol or other sweet beverages. QUESTIONS AND ANSWERS ABOUT OTHER FATS IN THE BLOOD: SATURATED FAT, TRANS FAT, AND CHOLESTEROL What is trans fat? Trans fat is a type of fat that is formed when vegetable oil is hardened through a process called hydrogenation. This process helps makes foods more solid, gives them shape, and prolongs their shelf life. Trans fats are also called hydrogenated or partially hydrogenated oils.  What do saturated fat, trans fat, and cholesterol in foods have to do with heart disease? Saturated fat, trans fat, and cholesterol in the diet all raise the level of LDL "bad" cholesterol in the blood. The higher the LDL cholesterol, the greater the risk for coronary heart disease (CHD). Saturated fat and trans fat raise LDL similarly.  What foods contain saturated fat, trans fat, and cholesterol? High amounts of saturated fat are found in animal products, such as fatty cuts of meat, chicken skin, and full-fat dairy products like butter, whole milk, cream, and cheese, and in tropical vegetable oils such as palm, palm kernel, and coconut oil. Trans fat is found in some of the same foods as saturated fat, such as vegetable shortening, some margarines (especially hard or stick margarine), crackers, cookies, baked goods, fried foods, salad dressings, and other processed foods made with partially hydrogenated vegetable oils. Small amounts of trans fat also occur naturally in some animal  products, such as milk products, beef, and lamb. Foods high in cholesterol include liver, other organ meats, egg yolks, shrimp, and full-fat dairy products. How can I use the new food label to make heart-healthy food choices? Check the Nutrition Facts panel of the food label. Choose foods lower in saturated fat, trans fat, and cholesterol. For saturated fat and cholesterol, you can also use the Percent Daily Value (%DV): 5% DV or less is low, and 20% DV or more is high. (There is no %DV for trans fat.) Use the Nutrition Facts panel to choose foods low in saturated fat and cholesterol, and if the trans fat is not listed, read the ingredients and limit products that list shortening or hydrogenated or partially hydrogenated vegetable oil, which tend to be high in trans fat. POINTS TO REMEMBER:   Discuss your risk for heart disease with your caregivers, and take steps to reduce risk factors.  Change your diet. Choose foods that are low in saturated fat, trans fat, and cholesterol.  Add exercise to your daily routine if   it is not already being done. Participate in physical activity of moderate intensity, like brisk walking, for at least 30 minutes on most, and preferably all days of the week. No time? Break the 30 minutes into three, 10-minute segments during the day.  Stop smoking. If you do smoke, contact your caregiver to discuss ways in which they can help you quit.  Do not use street drugs.  Maintain a normal weight.  Maintain a healthy blood pressure.  Keep up with your blood work for checking the fats in your blood as directed by your caregiver. Document Released: 12/04/2003 Document Revised: 08/17/2011 Document Reviewed: 07/01/2008 [facility] Patient Information 2014 [place], [place]. Health Maintenance, Males A healthy lifestyle and preventative care can promote health and wellness.  Maintain regular health, dental, and eye exams.  Eat a healthy diet. Foods like vegetables, fruits, whole  grains, low-fat dairy products, and lean protein foods contain the nutrients you need without too many calories. Decrease your intake of foods high in solid fats, added sugars, and salt. Get information about a proper diet from your caregiver, if necessary.  Regular physical exercise is one of the most important things you can do for your health. Most adults should get at least 150 minutes of moderate-intensity exercise (any activity that increases your heart rate and causes you to sweat) each week. In addition, most adults need muscle-strengthening exercises on 2 or more days a week.   Maintain a healthy weight. The body mass index (BMI) is a screening tool to identify possible weight problems. It provides an estimate of body fat based on height and weight. Your caregiver can help determine your BMI, and can help you achieve or maintain a healthy weight. For adults 20 years and older:  A BMI below 18.5 is considered underweight.  A BMI of 18.5 to 24.9 is normal.  A BMI of 25 to 29.9 is considered overweight.  A BMI of 30 and above is considered obese.  Maintain normal blood lipids and cholesterol by exercising and minimizing your intake of saturated fat. Eat a balanced diet with plenty of fruits and vegetables. Blood tests for lipids and cholesterol should begin at age 34 and be repeated every 5 years. If your lipid or cholesterol levels are high, you are over 50, or you are a high risk for heart disease, you may need your cholesterol levels checked more frequently.Ongoing high lipid and cholesterol levels should be treated with medicines, if diet and exercise are not effective.  If you smoke, find out from your caregiver how to quit. If you do not use tobacco, do not start.  If you choose to drink alcohol, do not exceed 2 drinks per day. One drink is considered to be 12 ounces (355 mL) of beer, 5 ounces (148 mL) of wine, or 1.5 ounces (44 mL) of liquor.  Avoid use of street drugs. Do not  share needles with anyone. Ask for help if you need support or instructions about stopping the use of drugs.  High blood pressure causes heart disease and increases the risk of stroke. Blood pressure should be checked at least every 1 to 2 years. Ongoing high blood pressure should be treated with medicines if weight loss and exercise are not effective.  If you are 87 to 71 years old, ask your caregiver if you should take aspirin to prevent heart disease.  Diabetes screening involves taking a blood sample to check your fasting blood sugar level. This should be done once every 3 years,  after age 47, if you are within normal weight and without risk factors for diabetes. Testing should be considered at a younger age or be carried out more frequently if you are overweight and have at least 1 risk factor for diabetes.  Colorectal cancer can be detected and often prevented. Most routine colorectal cancer screening begins at the age of 48 and continues through age 35. However, your caregiver may recommend screening at an earlier age if you have risk factors for colon cancer. On a yearly basis, your caregiver may provide home test kits to check for hidden blood in the stool. Use of a small camera at the end of a tube, to directly examine the colon (sigmoidoscopy or colonoscopy), can detect the earliest forms of colorectal cancer. Talk to your caregiver about this at age 79, when routine screening begins. Direct examination of the colon should be repeated every 5 to 10 years through age 3, unless early forms of pre-cancerous polyps or small growths are found.  Hepatitis C blood testing is recommended for all people born from [birth year] through 1964 and any individual with known risks for hepatitis C.  Healthy men should no longer receive prostate-specific antigen (PSA) blood tests as part of routine cancer screening. Consult with your caregiver about prostate cancer screening.  Testicular cancer screening is not  recommended for adolescents or adult males who have no symptoms. Screening includes self-exam, caregiver exam, and other screening tests. Consult with your caregiver about any symptoms you have or any concerns you have about testicular cancer.  Practice safe sex. Use condoms and avoid high-risk sexual practices to reduce the spread of sexually transmitted infections (STIs).  Use sunscreen with a sun protection factor (SPF) of 30 or greater. Apply sunscreen liberally and repeatedly throughout the day. You should seek shade when your shadow is shorter than you. Protect yourself by wearing long sleeves, pants, a wide-brimmed hat, and sunglasses year round, whenever you are outdoors.  Notify your caregiver of new moles or changes in moles, especially if there is a change in shape or color. Also notify your caregiver if a mole is larger than the size of a pencil eraser.  A one-time screening for abdominal aortic aneurysm (AAA) and surgical repair of large AAAs by sound wave imaging (ultrasonography) is recommended for ages 30 to 63 years who are current or former smokers.  Stay current with your immunizations. Document Released: 08/14/2007 Document Revised: 05/10/2011 Document Reviewed: 07/13/2010 [facility] Patient Information 2014 [place], [place].

## 2012-11-28 NOTE — Assessment & Plan Note (Signed)
He agrees to start pravachol

## 2012-11-28 NOTE — Assessment & Plan Note (Signed)

## 2012-12-11 ENCOUNTER — Other Ambulatory Visit: Payer: Self-pay | Admitting: Gastroenterology

## 2012-12-12 ENCOUNTER — Encounter: Payer: Self-pay | Admitting: Cardiovascular Disease

## 2012-12-12 ENCOUNTER — Ambulatory Visit (INDEPENDENT_AMBULATORY_CARE_PROVIDER_SITE_OTHER): Payer: Medicare Other | Admitting: Cardiovascular Disease

## 2012-12-12 VITALS — BP 106/78 | HR 44 | Ht 67.5 in | Wt 219.0 lb

## 2012-12-12 DIAGNOSIS — I4891 Unspecified atrial fibrillation: Secondary | ICD-10-CM | POA: Diagnosis not present

## 2012-12-12 DIAGNOSIS — E785 Hyperlipidemia, unspecified: Secondary | ICD-10-CM

## 2012-12-12 NOTE — Assessment & Plan Note (Signed)
Frank [name] has had hyperlipidemia-hypertriglyceridemia for the past several years. His levels a year ago were fairly acceptable. His most recent labs revealed high triglyceride levels and was started on a statin. He does not want to take statin but would rather start a diet and exercise program. I've instructed him to avoid things that are white, wheat, and sweet. I've given him the [facility] low glycemic index diet. We'll recheck his numbers again in 6 months.

## 2012-12-12 NOTE — Assessment & Plan Note (Signed)
His rhythm has Remained stable. She had any recurrent atrial fibrillation.
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
## 2012-12-12 NOTE — Patient Instructions (Addendum)
Your physician wants you to follow-up in: 6 months  You will receive a reminder letter in the mail two months in advance. If you don't receive a letter, please call our office to schedule the follow-up appointment.  Your physician recommends that you return for a FASTING lipid profile: 6 months   [facility] Low Glycemic Diet (Source: [facility], 2006) Low Glycemic Foods (20-49) (Decrease risk of developing heart disease) Breakfast Cereals: All-Bran All-Bran Fruit 'n Oats Fiber One Oatmeal (not instant) Oat bran Fruits and fruit juices: (Limit to 1-2 servings per day) Apples Apricots (fresh & dried) Blackberries Blueberries Cherries Cranberries Peaches Pears Plums Prunes Grapefruit Raspberries Strawberries Tangerine Apple juice Grapefruit juice Tomato juice Beans and legumes (fresh-cooked): Black-eyed peas Butter beans Chick peas Lentils  Green beans Lima beans Kidney beans Navy beans Pinto beans Snow peas Non-starchy vegetables: Asparagus, avocado, broccoli, cabbage, cauliflower, celery, cucumber, greens, lettuce, mushrooms, peppers, tomatoes, okra, onions, spinach, summer squash Grains: Barley Bulgur Rye Wild rice Nuts and oils : Almonds Peanuts Sunflower seeds Hazelnuts Pecans Walnuts Oils that are liquid at room temperature Dairy, fish, meat, soy, and eggs: Milk, skim Lowfat cheese Yogurt, lowfat, fruit sugar sweetened Lean red meat Fish  Skinless chicken & [place] Shellfish Egg whites (up to 3 daily) Soy products  Egg yolks (up to 7 or _____ per week) Moderate Glycemic Foods (50-69) Breakfast Cereals: Bran Buds Bran Chex Just Right Mini-Wheats  Special K Swiss muesli Fruits: Banana (under-ripe) Dates Figs Grapes Kiwi Mango Oranges Raisins Fruit Juices: Cranberry juice Orange juice Beans and legumes: Boston-type baked beans Canned pinto, kidney, or navy beans Green peas Vegetables: Beets Carrots  Sweet potato Yam Corn on the  cob Breads: Pita (pocket) bread Oat bran bread Pumpernickel bread Rye bread Wheat bread, high fiber  Grains: Cornmeal Rice, brown Rice, white Couscous Pasta: Macaroni Pizza, cheese Ravioli, meat filled Spaghetti, white  Nuts: Cashews Macadamia Snacks: Chocolate Ice cream, lowfat Muffin Popcorn High Glycemic Foods (70-100)  Breakfast Cereals: Cheerios Corn Chex Corn Flakes Cream of Wheat Grape Nuts Grape Nut Flakes Grits Nutri-Grain Puffed Rice Puffed Wheat Rice Chex Rice Krispies Shredded Wheat Team Total Fruits: Pineapple Watermelon Banana (over-ripe) Beverages: Sodas, sweet tea, pineapple juice Vegetables: Potato, baked, boiled, fried, mashed [place] fries Canned or frozen corn Parsnips Winter squash Breads: Most breads (white and whole grain) Bagels Bread sticks Bread stuffing Kaiser roll Dinner rolls Grains: Rice, instant Tapioca, with milk Candy and most cookies Snacks: Donuts Corn chips Jelly beans Pretzels Pastries

## 2012-12-13 ENCOUNTER — Telehealth: Payer: Self-pay | Admitting: Gastroenterology

## 2012-12-13 MED ORDER — ESOMEPRAZOLE MAGNESIUM 40 MG PO CPDR: 40.0000 mg | DELAYED_RELEASE_CAPSULE | Freq: Every day | ORAL | Status: DC

## 2012-12-13 NOTE — Telephone Encounter (Signed)
rx sent

## 2012-12-28 ENCOUNTER — Encounter: Payer: Self-pay | Admitting: Gastroenterology

## 2012-12-28 ENCOUNTER — Ambulatory Visit (INDEPENDENT_AMBULATORY_CARE_PROVIDER_SITE_OTHER): Payer: Medicare Other | Admitting: Gastroenterology

## 2012-12-28 VITALS — BP 124/68 | HR 54 | Ht 65.5 in | Wt 215.0 lb

## 2012-12-28 DIAGNOSIS — K439 Ventral hernia without obstruction or gangrene: Secondary | ICD-10-CM | POA: Diagnosis not present

## 2012-12-28 DIAGNOSIS — K219 Gastro-esophageal reflux disease without esophagitis: Secondary | ICD-10-CM

## 2012-12-28 MED ORDER — ESOMEPRAZOLE MAGNESIUM 40 MG PO CPDR: 40.0000 mg | DELAYED_RELEASE_CAPSULE | Freq: Every day | ORAL | Status: DC

## 2012-12-28 NOTE — Patient Instructions (Signed)
Please follow up in one year.  New prescription for Nexium was sent to your pharmacy.   Information on acid reflux is below for your information. _____________________________________________________________________________  Gastroesophageal Reflux Disease, Adult Gastroesophageal reflux disease (GERD) happens when acid from your stomach flows up into the esophagus. When acid comes in contact with the esophagus, the acid causes soreness (inflammation) in the esophagus. Over time, GERD may create small holes (ulcers) in the lining of the esophagus. CAUSES   Increased body weight. This puts pressure on the stomach, making acid rise from the stomach into the esophagus.  Smoking. This increases acid production in the stomach.  Drinking alcohol. This causes decreased pressure in the lower esophageal sphincter (valve or ring of muscle between the esophagus and stomach), allowing acid from the stomach into the esophagus.  Late evening meals and a full stomach. This increases pressure and acid production in the stomach.  A malformed lower esophageal sphincter. Sometimes, no cause is found. SYMPTOMS   Burning pain in the lower part of the mid-chest behind the breastbone and in the mid-stomach area. This may occur twice a week or more often.  Trouble swallowing.  Sore throat.  Dry cough.  Asthma-like symptoms including chest tightness, shortness of breath, or wheezing. DIAGNOSIS  Your caregiver may be able to diagnose GERD based on your symptoms. In some cases, X-rays and other tests may be done to check for complications or to check the condition of your stomach and esophagus. TREATMENT  Your caregiver may recommend over-the-counter or prescription medicines to help decrease acid production. Ask your caregiver before starting or adding any new medicines.  HOME CARE INSTRUCTIONS   Change the factors that you can control. Ask your caregiver for guidance concerning weight loss, quitting  smoking, and alcohol consumption.  Avoid foods and drinks that make your symptoms worse, such as:  Caffeine or alcoholic drinks.  Chocolate.  Peppermint or mint flavorings.  Garlic and onions.  Spicy foods.  Citrus fruits, such as oranges, lemons, or limes.  Tomato-based foods such as sauce, chili, salsa, and pizza.  Fried and fatty foods.  Avoid lying down for the 3 hours prior to your bedtime or prior to taking a nap.  Eat small, frequent meals instead of large meals.  Wear loose-fitting clothing. Do not wear anything tight around your waist that causes pressure on your stomach.  Raise the head of your bed 6 to 8 inches with wood blocks to help you sleep. Extra pillows will not help.  Only take over-the-counter or prescription medicines for pain, discomfort, or fever as directed by your caregiver.  Do not take aspirin, ibuprofen, or other nonsteroidal anti-inflammatory drugs (NSAIDs). SEEK IMMEDIATE MEDICAL CARE IF:   You have pain in your arms, neck, jaw, teeth, or back.  Your pain increases or changes in intensity or duration.  You develop nausea, vomiting, or sweating (diaphoresis).  You develop shortness of breath, or you faint.  Your vomit is green, yellow, black, or looks like coffee grounds or blood.  Your stool is red, bloody, or black. These symptoms could be signs of other problems, such as heart disease, gastric bleeding, or esophageal bleeding. MAKE SURE YOU:   Understand these instructions.  Will watch your condition.  Will get help right away if you are not doing well or get worse. Document Released: 11/25/2004 Document Revised: 05/10/2011 Document Reviewed: 09/04/2010 [facility] Patient Information 2014 [place], [place].

## 2012-12-28 NOTE — Progress Notes (Signed)
This is a 72 year old Caucasian male who has a history of chronic acid reflux with very hard to treat GERD.  At one point fundoplication was considered, but never completed.  He's had multiple endoscopy and esophageal dilatations, but does not have Barrett's mucosa on review of his biopsies.  He has voluntarily lost 20-25 pounds in weight he has had marked improvement in his acid reflux.  He takes Nexium 40 mg a day chronically, and currently denies chest pain, dysphagia, or any hepatobiliary or lower gastrointestinal problems.  He has had previous repair of an umbilical hernia and has a large ventral hernia, but this causes him no symptoms currently.  His appetite is good, he is slowly losing weight, and he is exercising twice a day.  He denies any general medical problems otherwise.  Current Medications, Allergies, Past Medical History, Past Surgical History, Family History and Social History were reviewed in [employer] record.  ROS: All systems were reviewed and are negative unless otherwise stated in the HPI.          Physical Exam: Healthy-appearing patient in no acute distress.  Blood pressure 124/68, pulse 54 and regular and weight 215 pounds with a BMI of 35.22.  Abdominal exam shows a prominent ventral hernia which is easily reducible.  I cannot appreciate an umbilical hernia, hepatosplenomegaly, other abdominal masses or tenderness.  Bowel sounds are normal.  Mental status is normal    Assessment and Plan: Chronic GERD doing well on daily PPI therapy.  I've encouraged him to continue his gradual weight loss reduction-exercise program.  I do not think he needs repair of his ventral hernia at this time since he is asymptomatic.  We have reviewed a chronic acid reflux regime, and his continue daily PPI therapy per his history of complicated GERD.  We will see him on a when necessary basis as needed.

## 2013-03-03 ENCOUNTER — Other Ambulatory Visit: Payer: Self-pay | Admitting: Cardiovascular Disease

## 2013-03-30 ENCOUNTER — Ambulatory Visit (INDEPENDENT_AMBULATORY_CARE_PROVIDER_SITE_OTHER): Payer: Medicare Other | Admitting: Internal Medicine

## 2013-03-30 ENCOUNTER — Other Ambulatory Visit (INDEPENDENT_AMBULATORY_CARE_PROVIDER_SITE_OTHER): Payer: Medicare Other

## 2013-03-30 ENCOUNTER — Encounter: Payer: Self-pay | Admitting: Internal Medicine

## 2013-03-30 VITALS — BP 120/86 | HR 45 | Temp 97.6°F | Resp 16 | Ht 65.0 in | Wt 206.0 lb

## 2013-03-30 DIAGNOSIS — E785 Hyperlipidemia, unspecified: Secondary | ICD-10-CM

## 2013-03-30 DIAGNOSIS — Z2911 Encounter for prophylactic immunotherapy for respiratory syncytial virus (RSV): Secondary | ICD-10-CM | POA: Diagnosis not present

## 2013-03-30 DIAGNOSIS — E781 Pure hyperglyceridemia: Secondary | ICD-10-CM

## 2013-03-30 DIAGNOSIS — Z23 Encounter for immunization: Secondary | ICD-10-CM

## 2013-03-30 DIAGNOSIS — I4891 Unspecified atrial fibrillation: Secondary | ICD-10-CM

## 2013-03-30 DIAGNOSIS — Z0000 Encounter for general adult medical examination without abnormal findings: Secondary | ICD-10-CM

## 2013-03-30 LAB — LIPID PANEL
CHOL/HDL RATIO: 6
Cholesterol: 180 mg/dL (ref 0–200)
HDL: 28.9 mg/dL — ABNORMAL LOW (ref >39.00)
TRIGLYCERIDES: 206 mg/dL — AB (ref 0.0–149.0)
VLDL: 41.2 mg/dL — ABNORMAL HIGH (ref 0.0–40.0)

## 2013-03-30 LAB — LDL CHOLESTEROL, DIRECT: LDL DIRECT: 108.5 mg/dL

## 2013-03-30 NOTE — Assessment & Plan Note (Signed)
I will recheck his FLP today

## 2013-03-30 NOTE — Assessment & Plan Note (Signed)
He has decided not to take a statin

## 2013-03-30 NOTE — Progress Notes (Signed)
Pre visit review using our clinic review tool, if applicable. No additional management support is needed unless otherwise documented below in the visit note. 

## 2013-03-30 NOTE — Progress Notes (Signed)
Subjective:    Patient ID: Frank [name], male    DOB: 1940/04/15, 73 y.o.   MRN: [id number]  Hyperlipidemia This is a chronic problem. The current episode started more than 1 year ago. The problem is controlled. Recent lipid tests were reviewed and are variable. Exacerbating diseases include obesity. He has no history of chronic renal disease, diabetes, hypothyroidism, liver disease or nephrotic syndrome. There are no known factors aggravating his hyperlipidemia. Pertinent negatives include no chest pain, focal sensory loss, focal weakness, leg pain, myalgias or shortness of breath. Current antihyperlipidemic treatment includes statins. The current treatment provides moderate improvement of lipids. Compliance problems include psychosocial issues, adherence to diet and adherence to exercise.       Review of Systems  Constitutional: Negative.  Negative for fever, chills, diaphoresis, appetite change and fatigue.  HENT: Negative.   Eyes: Negative.   Respiratory: Negative.  Negative for cough, choking, chest tightness, shortness of breath, wheezing and stridor.   Cardiovascular: Negative.  Negative for chest pain, palpitations and leg swelling.  Gastrointestinal: Negative.  Negative for nausea, vomiting, abdominal pain, diarrhea, constipation and blood in stool.  Endocrine: Negative.   Genitourinary: Negative.   Musculoskeletal: Negative.  Negative for arthralgias, back pain, gait problem, joint swelling, myalgias, neck pain and neck stiffness.  Skin: Negative.   Allergic/Immunologic: Negative.   Neurological: Negative.  Negative for dizziness, tremors, focal weakness, weakness, light-headedness and numbness.  Hematological: Negative.  Negative for adenopathy. Does not bruise/bleed easily.  Psychiatric/Behavioral: Negative.        Objective:   Physical Exam  Vitals reviewed. Constitutional: He is oriented to person, place, and time. He appears well-developed and well-nourished. No  distress.  HENT:  Head: Normocephalic and atraumatic.  Mouth/Throat: Oropharynx is clear and moist. No oropharyngeal exudate.  Eyes: Conjunctivae are normal. Right eye exhibits no discharge. Left eye exhibits no discharge. No scleral icterus.  Neck: Normal range of motion. Neck supple. No JVD present. No tracheal deviation present. No thyromegaly present.  Cardiovascular: Normal rate, regular rhythm, normal heart sounds and intact distal pulses.  Exam reveals no gallop and no friction rub.   No murmur heard. Pulmonary/Chest: Effort normal and breath sounds normal. No stridor. No respiratory distress. He has no wheezes. He has no rales. He exhibits no tenderness.  Abdominal: Soft. Bowel sounds are normal. He exhibits no distension and no mass. There is no tenderness. There is no rebound and no guarding.  Musculoskeletal: Normal range of motion. He exhibits no edema and no tenderness.  Lymphadenopathy:    He has no cervical adenopathy.  Neurological: He is oriented to person, place, and time.  Skin: Skin is warm and dry. No rash noted. He is not diaphoretic. No erythema. No pallor.  Psychiatric: He has a normal mood and affect. His behavior is normal. Judgment and thought content normal.     Lab Results  Component Value Date   WBC 7.4 11/28/2012   HGB 15.5 11/28/2012   HCT 44.9 11/28/2012   PLT 209.0 11/28/2012   GLUCOSE 98 11/28/2012   CHOL 170 11/28/2012   TRIG 242.0* 11/28/2012   HDL 28.20* 11/28/2012   LDLDIRECT 88.9 11/28/2012   LDLCALC 112* 04/16/2011   ALT 31 11/28/2012   AST 23 11/28/2012   NA 136 11/28/2012   K 3.9 11/28/2012   CL 104 11/28/2012   CREATININE 0.8 11/28/2012   BUN 11 11/28/2012   CO2 23 11/28/2012   TSH 1.56 11/28/2012   PSA 0.14 11/28/2012  INR 1.00 04/15/2011   HGBA1C 5.5 11/28/2012       Assessment & Plan:

## 2013-03-30 NOTE — Patient Instructions (Signed)
Hypertriglyceridemia  Diet for High blood levels of Triglycerides Most fats in food are triglycerides. Triglycerides in your blood are stored as fat in your body. High levels of triglycerides in your blood may put you at a greater risk for heart disease and stroke.  Normal triglyceride levels are less than 150 mg/dL. Borderline high levels are 150-199 mg/dl. High levels are 200 - 499 mg/dL, and very high triglyceride levels are greater than 500 mg/dL. The decision to treat high triglycerides is generally based on the level. For people with borderline or high triglyceride levels, treatment includes weight loss and exercise. Drugs are recommended for people with very high triglyceride levels. Many people who need treatment for high triglyceride levels have metabolic syndrome. This syndrome is a collection of disorders that often include: insulin resistance, high blood pressure, blood clotting problems, high cholesterol and triglycerides. TESTING PROCEDURE FOR TRIGLYCERIDES  You should not eat 4 hours before getting your triglycerides measured. The normal range of triglycerides is between 10 and 250 milligrams per deciliter (mg/dl). Some people may have extreme levels (1000 or above), but your triglyceride level may be too high if it is above 150 mg/dl, depending on what other risk factors you have for heart disease.  People with high blood triglycerides may also have high blood cholesterol levels. If you have high blood cholesterol as well as high blood triglycerides, your risk for heart disease is probably greater than if you only had high triglycerides. High blood cholesterol is one of the main risk factors for heart disease. CHANGING YOUR DIET  Your weight can affect your blood triglyceride level. If you are more than 20% above your ideal body weight, you may be able to lower your blood triglycerides by losing weight. Eating less and exercising regularly is the best way to combat this. Fat provides more  calories than any other food. The best way to lose weight is to eat less fat. Only 30% of your total calories should come from fat. Less than 7% of your diet should come from saturated fat. A diet low in fat and saturated fat is the same as a diet to decrease blood cholesterol. By eating a diet lower in fat, you may lose weight, lower your blood cholesterol, and lower your blood triglyceride level.  Eating a diet low in fat, especially saturated fat, may also help you lower your blood triglyceride level. Ask your dietitian to help you figure how much fat you can eat based on the number of calories your caregiver has prescribed for you.  Exercise, in addition to helping with weight loss may also help lower triglyceride levels.   Alcohol can increase blood triglycerides. You may need to stop drinking alcoholic beverages.  Too much carbohydrate in your diet may also increase your blood triglycerides. Some complex carbohydrates are necessary in your diet. These may include bread, rice, potatoes, other starchy vegetables and cereals.  Reduce "simple" carbohydrates. These may include pure sugars, candy, honey, and jelly without losing other nutrients. If you have the kind of high blood triglycerides that is affected by the amount of carbohydrates in your diet, you will need to eat less sugar and less high-sugar foods. Your caregiver can help you with this.  Adding 2-4 grams of fish oil (EPA+ DHA) may also help lower triglycerides. Speak with your caregiver before adding any supplements to your regimen. Following the Diet  Maintain your ideal weight. Your caregivers can help you with a diet. Generally, eating less food and getting more   exercise will help you lose weight. Joining a weight control group may also help. Ask your caregivers for a good weight control group in your area.  Eat low-fat foods instead of high-fat foods. This can help you lose weight too.  These foods are lower in fat. Eat MORE of these:    Dried beans, peas, and lentils.  Egg whites.  Low-fat cottage cheese.  Fish.  Lean cuts of meat, such as round, sirloin, rump, and flank (cut extra fat off meat you fix).  Whole grain breads, cereals and pasta.  Skim and nonfat dry milk.  Low-fat yogurt.  Poultry without the skin.  Cheese made with skim or part-skim milk, such as mozzarella, parmesan, farmers', ricotta, or pot cheese. These are higher fat foods. Eat LESS of these:   Whole milk and foods made from whole milk, such as American, blue, cheddar, monterey jack, and swiss cheese  High-fat meats, such as luncheon meats, sausages, knockwurst, bratwurst, hot dogs, ribs, corned beef, ground pork, and regular ground beef.  Fried foods. Limit saturated fats in your diet. Substituting unsaturated fat for saturated fat may decrease your blood triglyceride level. You will need to read package labels to know which products contain saturated fats.  These foods are high in saturated fat. Eat LESS of these:   Fried pork skins.  Whole milk.  Skin and fat from poultry.  Palm oil.  Butter.  Shortening.  Cream cheese.  Bacon.  Margarines and baked goods made from listed oils.  Vegetable shortenings.  Chitterlings.  Fat from meats.  Coconut oil.  Palm kernel oil.  Lard.  Cream.  Sour cream.  Fatback.  Coffee whiteners and non-dairy creamers made with these oils.  Cheese made from whole milk. Use unsaturated fats (both polyunsaturated and monounsaturated) moderately. Remember, even though unsaturated fats are better than saturated fats; you still want a diet low in total fat.  These foods are high in unsaturated fat:   Canola oil.  Sunflower oil.  Mayonnaise.  Almonds.  Peanuts.  Pine nuts.  Margarines made with these oils.  Safflower oil.  Olive oil.  Avocados.  Cashews.  Peanut butter.  Sunflower seeds.  Soybean oil.  Peanut  oil.  Olives.  Pecans.  Walnuts.  Pumpkin seeds. Avoid sugar and other high-sugar foods. This will decrease carbohydrates without decreasing other nutrients. Sugar in your food goes rapidly to your blood. When there is excess sugar in your blood, your liver may use it to make more triglycerides. Sugar also contains calories without other important nutrients.  Eat LESS of these:   Sugar, brown sugar, powdered sugar, jam, jelly, preserves, honey, syrup, molasses, pies, candy, cakes, cookies, frosting, pastries, colas, soft drinks, punches, fruit drinks, and regular gelatin.  Avoid alcohol. Alcohol, even more than sugar, may increase blood triglycerides. In addition, alcohol is high in calories and low in nutrients. Ask for sparkling water, or a diet soft drink instead of an alcoholic beverage. Suggestions for planning and preparing meals   Bake, broil, grill or roast meats instead of frying.  Remove fat from meats and skin from poultry before cooking.  Add spices, herbs, lemon juice or vinegar to vegetables instead of salt, rich sauces or gravies.  Use a non-stick skillet without fat or use no-stick sprays.  Cool and refrigerate stews and broth. Then remove the hardened fat floating on the surface before serving.  Refrigerate meat drippings and skim off fat to make low-fat gravies.  Serve more fish.  Use less butter,   margarine and other high-fat spreads on bread or vegetables.  Use skim or reconstituted non-fat dry milk for cooking.  Cook with low-fat cheeses.  Substitute low-fat yogurt or cottage cheese for all or part of the sour cream in recipes for sauces, dips or congealed salads.  Use half yogurt/half mayonnaise in salad recipes.  Substitute evaporated skim milk for cream. Evaporated skim milk or reconstituted non-fat dry milk can be whipped and substituted for whipped cream in certain recipes.  Choose fresh fruits for dessert instead of high-fat foods such as pies or  cakes. Fruits are naturally low in fat. When Dining Out   Order low-fat appetizers such as fruit or vegetable juice, pasta with vegetables or tomato sauce.  Select clear, rather than cream soups.  Ask that dressings and gravies be served on the side. Then use less of them.  Order foods that are baked, broiled, poached, steamed, stir-fried, or roasted.  Ask for margarine instead of butter, and use only a small amount.  Drink sparkling water, unsweetened tea or coffee, or diet soft drinks instead of alcohol or other sweet beverages. QUESTIONS AND ANSWERS ABOUT OTHER FATS IN THE BLOOD: SATURATED FAT, TRANS FAT, AND CHOLESTEROL What is trans fat? Trans fat is a type of fat that is formed when vegetable oil is hardened through a process called hydrogenation. This process helps makes foods more solid, gives them shape, and prolongs their shelf life. Trans fats are also called hydrogenated or partially hydrogenated oils.  What do saturated fat, trans fat, and cholesterol in foods have to do with heart disease? Saturated fat, trans fat, and cholesterol in the diet all raise the level of LDL "bad" cholesterol in the blood. The higher the LDL cholesterol, the greater the risk for coronary heart disease (CHD). Saturated fat and trans fat raise LDL similarly.  What foods contain saturated fat, trans fat, and cholesterol? High amounts of saturated fat are found in animal products, such as fatty cuts of meat, chicken skin, and full-fat dairy products like butter, whole milk, cream, and cheese, and in tropical vegetable oils such as palm, palm kernel, and coconut oil. Trans fat is found in some of the same foods as saturated fat, such as vegetable shortening, some margarines (especially hard or stick margarine), crackers, cookies, baked goods, fried foods, salad dressings, and other processed foods made with partially hydrogenated vegetable oils. Small amounts of trans fat also occur naturally in some animal  products, such as milk products, beef, and lamb. Foods high in cholesterol include liver, other organ meats, egg yolks, shrimp, and full-fat dairy products. How can I use the new food label to make heart-healthy food choices? Check the Nutrition Facts panel of the food label. Choose foods lower in saturated fat, trans fat, and cholesterol. For saturated fat and cholesterol, you can also use the Percent Daily Value (%DV): 5% DV or less is low, and 20% DV or more is high. (There is no %DV for trans fat.) Use the Nutrition Facts panel to choose foods low in saturated fat and cholesterol, and if the trans fat is not listed, read the ingredients and limit products that list shortening or hydrogenated or partially hydrogenated vegetable oil, which tend to be high in trans fat. POINTS TO REMEMBER:   Discuss your risk for heart disease with your caregivers, and take steps to reduce risk factors.  Change your diet. Choose foods that are low in saturated fat, trans fat, and cholesterol.  Add exercise to your daily routine if   it is not already being done. Participate in physical activity of moderate intensity, like brisk walking, for at least 30 minutes on most, and preferably all days of the week. No time? Break the 30 minutes into three, 10-minute segments during the day.  Stop smoking. If you do smoke, contact your caregiver to discuss ways in which they can help you quit.  Do not use street drugs.  Maintain a normal weight.  Maintain a healthy blood pressure.  Keep up with your blood work for checking the fats in your blood as directed by your caregiver. Document Released: 12/04/2003 Document Revised: 08/17/2011 Document Reviewed: 07/01/2008 [employer] Patient Information 2014 [employer].  

## 2013-03-30 NOTE — Assessment & Plan Note (Signed)
He has good rate and rhythm control and is not willing to take an anticoagulant

## 2013-04-06 ENCOUNTER — Other Ambulatory Visit: Payer: Self-pay | Admitting: Cardiovascular Disease

## 2013-06-11 ENCOUNTER — Other Ambulatory Visit: Payer: Self-pay | Admitting: Cardiovascular Disease

## 2013-06-26 ENCOUNTER — Other Ambulatory Visit: Payer: Medicare Other

## 2013-07-14 ENCOUNTER — Other Ambulatory Visit: Payer: Self-pay | Admitting: Cardiovascular Disease

## 2013-07-26 ENCOUNTER — Ambulatory Visit (INDEPENDENT_AMBULATORY_CARE_PROVIDER_SITE_OTHER): Payer: Medicare Other | Admitting: Cardiovascular Disease

## 2013-07-26 ENCOUNTER — Other Ambulatory Visit: Payer: Medicare Other

## 2013-07-26 ENCOUNTER — Encounter: Payer: Self-pay | Admitting: Cardiovascular Disease

## 2013-07-26 VITALS — BP 117/66 | HR 44 | Ht 65.0 in | Wt 199.0 lb

## 2013-07-26 DIAGNOSIS — E785 Hyperlipidemia, unspecified: Secondary | ICD-10-CM

## 2013-07-26 DIAGNOSIS — I4891 Unspecified atrial fibrillation: Secondary | ICD-10-CM | POA: Diagnosis not present

## 2013-07-26 LAB — BASIC METABOLIC PANEL
BUN: 12 mg/dL (ref 6–23)
CHLORIDE: 107 meq/L (ref 96–112)
CO2: 27 meq/L (ref 19–32)
CREATININE: 0.8 mg/dL (ref 0.4–1.5)
Calcium: 9.1 mg/dL (ref 8.4–10.5)
GFR: 99.7 mL/min (ref >60.00)
Glucose, Bld: 86 mg/dL (ref 70–99)
Potassium: 4 mEq/L (ref 3.5–5.1)
Sodium: 140 mEq/L (ref 135–145)

## 2013-07-26 LAB — HEPATIC FUNCTION PANEL
ALBUMIN: 4.1 g/dL (ref 3.5–5.2)
ALK PHOS: 57 U/L (ref 39–117)
ALT: 21 U/L (ref 0–53)
AST: 18 U/L (ref 0–37)
Bilirubin, Direct: 0.1 mg/dL (ref 0.0–0.3)
TOTAL PROTEIN: 6.7 g/dL (ref 6.0–8.3)
Total Bilirubin: 0.9 mg/dL (ref 0.2–1.2)

## 2013-07-26 LAB — LIPID PANEL
CHOLESTEROL: 153 mg/dL (ref 0–200)
HDL: 30.3 mg/dL — AB (ref >39.00)
LDL CALC: 82 mg/dL (ref 0–99)
TRIGLYCERIDES: 203 mg/dL — AB (ref 0.0–149.0)
Total CHOL/HDL Ratio: 5
VLDL: 40.6 mg/dL — ABNORMAL HIGH (ref 0.0–40.0)

## 2013-07-26 NOTE — Assessment & Plan Note (Signed)
Frank [name] is doing well. He's not had any recurrent episodes of atrial fibrillation. His heart rate and blood pressure fairly slow. He's lost quite awake and hopefully he remains stable. At this point we'll discontinue the diltiazem. He's been on aspirin once a day. If he has any recurrent atrial fibrillation we'll consider one of the new anticoagulants agents.

## 2013-07-26 NOTE — Progress Notes (Signed)
Frank [name] Date of Birth  February 20, 1941 [facility] [address]    [place]   [place], [place]  [address]    [place], [place]  [address] [phone]  Fax  [phone]  [phone]  Fax [phone]  Problem list: 1. Paroxysmal atrial fibrillation 2. History of syncope while driving   History of Present Illness:  [name] is a 73 yo with hx of PAF who was admitted to [facility] after having a syncopal episode while driving.  He had some warning and was able to pull the car over.   He had a 30 day monitor which did not reveal any significant arrhythmias.  He has been walking 30 minutes a day.  He has been eating "Lean Cuisine" dinners.    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  He has not had any symptoms of syncope.  He was started on diltiazem and has been doing well.  He has not had any head aches and is feeling well.    Oct. 14, 2014:  Frank [name] is doing well.  He is staying busy.  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  He get some exercise but needs to get more.  He has had problems with left thigh numbness.  The numbness has  Improved slightly .  He has lost 10 lbs over the past month.    Jul 26, 2013:  Frank [name] is doing well.  He has continued to lose some weight.  He has lost 20 lbs since his last office visit Oct. 2014.    No CP or dyspnea or syncope / presyncope.   Current Outpatient Prescriptions on File Prior to Visit  Medication Sig Dispense Refill  . Ascorbic Acid (VITAMIN C PO) Take 1 tablet by mouth daily.      Marland Kitchen aspirin 81 MG tablet Take 81 mg by mouth daily.       . calcium gluconate 500 MG tablet Take 500 mg by mouth daily.      Marland Kitchen diltiazem (CARDIZEM CD) 120 MG 24 hr capsule TAKE ONE CAPSULE BY MOUTH ONCE DAILY  30 capsule  0  . esomeprazole (NEXIUM) 40 MG capsule Take 1 capsule (40 mg total) by mouth daily  before breakfast.  30 capsule  11  . FIBER COMPLETE PO Take 1 tablet by mouth daily.      . fish oil-omega-3 fatty acids 1000 MG capsule Take 2 g by mouth daily.      . Multiple Vitamin (MULTIVITAMIN) tablet Take 1 tablet by mouth daily.       . naproxen sodium (ANAPROX) 550 MG tablet Take 550 mg by mouth as needed.        No current facility-administered medications on file prior to visit.    Allergies  Allergen Reactions  . Codeine     REACTION: unspecified  . Peanut-Containing Drug Products     Past Medical History  Diagnosis Date  . HA (headache)   . Hypertriglyceridemia   . Esophageal stricture   . Esophageal reflux   . Syncope     presented with atrial fib converted with Diltiazem  . PAF (paroxysmal atrial fibrillation)     Echo with normal LV function and mild LVH  . Hearing loss   . Obesity, unspecified   . Hypoglycemia   .  Hiatal hernia     Past Surgical History  Procedure Laterality Date  . Umbilical hernia repair  1993  . Tonsillectomy    . Coronary artery bypass graft    . Nasal septum surgery      History  Smoking status  . Former Smoker  . Quit date: 03/02/1987  Smokeless tobacco  . Never Used    History  Alcohol Use No    Family History  Problem Relation Age of Onset  . Colon cancer Neg Hx     Reviw of Systems:  Reviewed in the HPI.  All other systems are negative.  Physical Exam: Blood pressure 117/66, pulse 44, height 5\' 5"  (1.651 m), weight 199 lb (90.266 kg). General: Well developed, well nourished, in no acute distress.  Head: Normocephalic, atraumatic, sclera non-icteric, mucus membranes are moist,   Neck: Supple. Carotids are 2 + without bruits. No JVD  Lungs: Clear bilaterally to auscultation.  Heart: regular rate.  normal  S1 S2. No murmurs, gallops or rubs.  Abdomen: Soft, non-tender, non-distended with normal bowel sounds. No hepatomegaly. No rebound/guarding. No masses.  Msk:  Strength and tone are  normal  Extremities: No clubbing or cyanosis. No edema.  Distal pedal pulses are 2+ and equal bilaterally.  Neuro: Alert and oriented X 3. Moves all extremities spontaneously. Hard of hearing  Psych:  Responds to questions appropriately with a normal affect.  ECG:   Assessment / Plan:

## 2013-07-26 NOTE — Patient Instructions (Signed)
Your physician recommends that you have lab work:  TODAY  Your physician has recommended you make the following change in your medication:  STOP Diltiazem  Your physician wants you to follow-up in: 6 months with Dr. Acie Fredrickson.  You will receive a reminder letter in the mail two months in advance. If you don't receive a letter, please call our office to schedule the follow-up appointment.

## 2013-07-26 NOTE — Assessment & Plan Note (Signed)
His lipids are fairly well controlled. He has continued to lose quite a weight. We'll check fasting lipids, liver enzymes, and basic metabolic profile today. I'll see him again in 6 months for followup visit.

## 2013-09-28 ENCOUNTER — Ambulatory Visit: Payer: Medicare Other | Admitting: Internal Medicine

## 2013-10-22 DIAGNOSIS — H25019 Cortical age-related cataract, unspecified eye: Secondary | ICD-10-CM | POA: Diagnosis not present

## 2013-10-22 DIAGNOSIS — H5210 Myopia, unspecified eye: Secondary | ICD-10-CM | POA: Diagnosis not present

## 2013-11-09 ENCOUNTER — Encounter: Payer: Self-pay | Admitting: Gastroenterology

## 2013-11-09 DIAGNOSIS — Z79899 Other long term (current) drug therapy: Secondary | ICD-10-CM | POA: Diagnosis not present

## 2013-11-09 DIAGNOSIS — G43019 Migraine without aura, intractable, without status migrainosus: Secondary | ICD-10-CM | POA: Diagnosis not present

## 2013-11-19 ENCOUNTER — Encounter: Payer: Self-pay | Admitting: Internal Medicine

## 2013-11-19 LAB — COMPREHENSIVE METABOLIC PANEL
ALT: 20 U/L (ref 10–40)
AST: 18 U/L
Albumin: 4.2
Alkaline Phosphatase: 55 U/L
BUN: 11 mg/dL (ref 4–21)
CALCIUM: 8.7 mg/dL
CHOLESTEROL: 135 mg/dL (ref 0–200)
CO2: 26 mmol/L
CREATININE: 0.66
Chloride: 105 mmol/L
Glucose: 82
HDL: 27 mg/dL — AB (ref 35–70)
HEMATOCRIT: 44 %
HEMOGLOBIN: 15.1 g/dL
LDL Calculated: 48 mg/dL
Potassium: 3.8 mmol/L
Protein, Total: 6.6
Sed Rate: 3
Sodium: 140 mmol/L (ref 137–147)
Total Bilirubin: 0.6 mg/dL
Total CHOL/HDL Ratio: 5
Triglycerides: 302
VLDL Cholesterol Cal: 60

## 2013-11-21 ENCOUNTER — Encounter: Payer: Self-pay | Admitting: Internal Medicine

## 2013-11-21 ENCOUNTER — Ambulatory Visit (INDEPENDENT_AMBULATORY_CARE_PROVIDER_SITE_OTHER): Payer: Medicare Other | Admitting: Internal Medicine

## 2013-11-21 VITALS — BP 120/74 | HR 64 | Ht 65.0 in | Wt 197.0 lb

## 2013-11-21 DIAGNOSIS — K439 Ventral hernia without obstruction or gangrene: Secondary | ICD-10-CM

## 2013-11-21 DIAGNOSIS — K219 Gastro-esophageal reflux disease without esophagitis: Secondary | ICD-10-CM

## 2013-11-21 MED ORDER — ESOMEPRAZOLE MAGNESIUM 40 MG PO CPDR: 40.0000 mg | DELAYED_RELEASE_CAPSULE | Freq: Every day | ORAL | Status: DC

## 2013-11-21 NOTE — Patient Instructions (Signed)
We have sent the following medications to your pharmacy for you to pick up at your convenience:  Nexium  Please follow up with Dr. Henrene Pastor in 2 years.

## 2013-11-21 NOTE — Progress Notes (Signed)
HISTORY OF PRESENT ILLNESS:  Frank [name] is a 73 y.o. male with a history of gastroesophageal reflux disease, peptic stricture, and ventral hernia. He presents today for ongoing management of his chronic GERD. She takes Nexium 40 mg daily. On medication good symptom control. Off medication, significant pyrosis. No dysphagia. His last upper endoscopy with Dr. Sharlett Iles was October 2013. His last colonoscopy, with Dr. Velora Heckler was 2007. No polyps. He does have a ventral hernia and has questions regarding that. No problems with pain or other is with the hernia. He is brought report that with dietary changes and regular exercise he is lost about 20 pounds since his last visit.  REVIEW OF SYSTEMS:  All non-GI ROS negative except for headaches, arthritis  Past Medical History  Diagnosis Date  . HA (headache)   . Hypertriglyceridemia   . Esophageal stricture   . Esophageal reflux   . Syncope     presented with atrial fib converted with Diltiazem  . PAF (paroxysmal atrial fibrillation)     Echo with normal LV function and mild LVH  . Hearing loss   . Obesity, unspecified   . Hypoglycemia   . Hiatal hernia   . Diverticulosis     Past Surgical History  Procedure Laterality Date  . Umbilical hernia repair  1993  . Tonsillectomy    . Coronary artery bypass graft    . Nasal septum surgery      Social History Frank [name]  reports that he quit smoking about 26 years ago. He has never used smokeless tobacco. He reports that he does not drink alcohol or use illicit drugs.  family history is negative for Colon cancer.  Allergies  Allergen Reactions  . Codeine     REACTION: unspecified  . Peanut-Containing Drug Products        PHYSICAL EXAMINATION: Vital signs: BP 120/74  Pulse 64  Ht 5\' 5"  (1.651 m)  Wt 197 lb (89.359 kg)  BMI 32.78 kg/m2 General: Well-developed, well-nourished, no acute distress HEENT: Sclerae are anicteric, conjunctiva pink. Oral mucosa intact Lungs:  Clear Heart: Regular Abdomen: soft, nontender, nondistended, no obvious ascites, no peritoneal signs, normal bowel sounds. No organomegaly. Small ventral hernia above the umbilicus Extremities: No edema Psychiatric: alert and oriented x3. Cooperative     ASSESSMENT:  #1. GERD. Currently asymptomatic on PPI #2. History of peptic stricture. Asymptomatic #3. Colonoscopy 2007 without neoplasia #4. Ventral hernia. Asymptomatic     PLAN:  #1. Reflux precautions. Continue with sustained weight loss #2. Continue Nexium 40 mg daily. Prescription refilled #3. Routine office followup in 2 years. Sooner if needed #4. Repeat screening colonoscopy 2017 #5. If ventral hernia becomes symptomatic, contact your general surgeon.

## 2013-11-30 ENCOUNTER — Encounter: Payer: Medicare Other | Admitting: Internal Medicine

## 2013-12-03 ENCOUNTER — Ambulatory Visit (INDEPENDENT_AMBULATORY_CARE_PROVIDER_SITE_OTHER): Payer: Medicare Other | Admitting: Internal Medicine

## 2013-12-03 ENCOUNTER — Encounter: Payer: Self-pay | Admitting: Internal Medicine

## 2013-12-03 ENCOUNTER — Telehealth: Payer: Self-pay

## 2013-12-03 VITALS — BP 110/78 | HR 60 | Temp 98.7°F | Resp 16 | Wt 196.0 lb

## 2013-12-03 DIAGNOSIS — E669 Obesity, unspecified: Secondary | ICD-10-CM | POA: Diagnosis not present

## 2013-12-03 DIAGNOSIS — I4891 Unspecified atrial fibrillation: Secondary | ICD-10-CM

## 2013-12-03 DIAGNOSIS — Z6830 Body mass index (BMI) 30.0-30.9, adult: Secondary | ICD-10-CM

## 2013-12-03 DIAGNOSIS — K219 Gastro-esophageal reflux disease without esophagitis: Secondary | ICD-10-CM

## 2013-12-03 DIAGNOSIS — Z23 Encounter for immunization: Secondary | ICD-10-CM

## 2013-12-03 DIAGNOSIS — B351 Tinea unguium: Secondary | ICD-10-CM

## 2013-12-03 DIAGNOSIS — E785 Hyperlipidemia, unspecified: Secondary | ICD-10-CM

## 2013-12-03 DIAGNOSIS — Z0000 Encounter for general adult medical examination without abnormal findings: Secondary | ICD-10-CM | POA: Diagnosis not present

## 2013-12-03 DIAGNOSIS — I480 Paroxysmal atrial fibrillation: Secondary | ICD-10-CM

## 2013-12-03 DIAGNOSIS — E66811 Obesity, class 1: Secondary | ICD-10-CM

## 2013-12-03 DIAGNOSIS — N400 Enlarged prostate without lower urinary tract symptoms: Secondary | ICD-10-CM

## 2013-12-03 LAB — FECAL OCCULT BLOOD, GUAIAC: FECAL OCCULT BLD: NEGATIVE

## 2013-12-03 MED ORDER — EFINACONAZOLE 10 % EX SOLN: 1.0000 | Freq: Every day | CUTANEOUS | Status: DC

## 2013-12-03 NOTE — Progress Notes (Signed)
Subjective:    Patient ID: Frank [name], male    DOB: November 24, 1940, 73 y.o.   MRN: [id number]  HPI Comments: He returns for a complete physical but also complains about abnormal toenails, wants to have them treated.     Review of Systems  Constitutional: Negative.  Negative for fever, chills, diaphoresis, appetite change and fatigue.  HENT: Negative.  Negative for congestion, trouble swallowing and voice change.   Eyes: Negative.   Respiratory: Negative.  Negative for cough, choking, chest tightness, shortness of breath and stridor.   Cardiovascular: Negative.  Negative for chest pain, palpitations and leg swelling.  Gastrointestinal: Negative.  Negative for nausea, vomiting, abdominal pain, diarrhea and blood in stool.  Endocrine: Negative.   Genitourinary: Negative.  Negative for dysuria, urgency, hematuria, flank pain, decreased urine volume, difficulty urinating and testicular pain.  Musculoskeletal: Negative.  Negative for arthralgias, back pain, joint swelling, myalgias and neck stiffness.  Skin: Negative.  Negative for rash.  Allergic/Immunologic: Negative.   Neurological: Negative.  Negative for dizziness, tremors, weakness, light-headedness and numbness.  Hematological: Negative.  Negative for adenopathy. Does not bruise/bleed easily.  Psychiatric/Behavioral: Negative.        Objective:   Physical Exam  Vitals reviewed. Constitutional: He is oriented to person, place, and time. He appears well-developed and well-nourished. No distress.  HENT:  Head: Normocephalic and atraumatic.  Mouth/Throat: Oropharynx is clear and moist. No oropharyngeal exudate.  Eyes: Conjunctivae are normal. Right eye exhibits no discharge. Left eye exhibits no discharge. No scleral icterus.  Neck: Normal range of motion. Neck supple. No JVD present. No tracheal deviation present. No thyromegaly present.  Cardiovascular: Normal rate, regular rhythm, normal heart sounds and intact distal pulses.   Exam reveals no gallop and no friction rub.   No murmur heard. Pulmonary/Chest: Effort normal and breath sounds normal. No stridor. No respiratory distress. He has no wheezes. He has no rales. He exhibits no tenderness.  Abdominal: Soft. Bowel sounds are normal. He exhibits no distension and no mass. There is no tenderness. There is no rebound and no guarding. Hernia confirmed negative in the right inguinal area and confirmed negative in the left inguinal area.  Genitourinary: Rectum normal, prostate normal, testes normal and penis normal. Rectal exam shows no external hemorrhoid, no internal hemorrhoid, no fissure, no mass, no tenderness and anal tone normal. Guaiac negative stool. Prostate is not enlarged and not tender. Right testis shows no mass, no swelling and no tenderness. Right testis is descended. Left testis shows no mass, no swelling and no tenderness. Left testis is descended. Circumcised. No penile erythema or penile tenderness. No discharge found.  Musculoskeletal: Normal range of motion. He exhibits no edema and no tenderness.  Lymphadenopathy:    He has no cervical adenopathy.       Right: No inguinal adenopathy present.       Left: No inguinal adenopathy present.  Neurological: He is oriented to person, place, and time.  Skin: Skin is warm and dry. No purpura and no rash noted. Rash is not macular, not papular, not maculopapular, not nodular, not pustular, not vesicular and not urticarial. He is not diaphoretic. No erythema. No pallor.  50% of toenails show nail thickening with lysis and subungual debris  Psychiatric: He has a normal mood and affect. His behavior is normal. Judgment and thought content normal.     Lab Results  Component Value Date   WBC 7.4 11/28/2012   HGB 15.1 11/09/2013   HCT 44 11/09/2013  PLT 209.0 11/28/2012   GLUCOSE 86 07/26/2013   CHOL 135 11/09/2013   TRIG 302 11/09/2013   HDL 27* 11/09/2013   LDLDIRECT 108.5 03/30/2013   LDLCALC 48 11/09/2013   ALT 20  11/09/2013   AST 18 11/09/2013   NA 140 11/09/2013   K 3.8 11/09/2013   CL 105 11/09/2013   CREATININE 0.66 11/09/2013   BUN 11 11/09/2013   CO2 26 11/09/2013   TSH 1.56 11/28/2012   PSA 0.14 11/28/2012   INR 1.00 04/15/2011   HGBA1C 5.5 11/28/2012       Assessment & Plan:

## 2013-12-03 NOTE — Telephone Encounter (Signed)
Received pharmacy rejection stating that insurance will not cover Jublia, pt must try and fail terbinafine. Thanks

## 2013-12-03 NOTE — Patient Instructions (Signed)

## 2013-12-05 NOTE — Assessment & Plan Note (Signed)
He has lost 35# with diet and exercise

## 2013-12-05 NOTE — Assessment & Plan Note (Signed)
Start jublia

## 2013-12-05 NOTE — Assessment & Plan Note (Signed)
Symptoms are well controlled

## 2013-12-05 NOTE — Assessment & Plan Note (Signed)
He has achieved his LDL goal 

## 2013-12-05 NOTE — Assessment & Plan Note (Signed)
He has good rate and rhythm control

## 2013-12-05 NOTE — Assessment & Plan Note (Signed)

## 2014-03-04 DIAGNOSIS — M542 Cervicalgia: Secondary | ICD-10-CM | POA: Diagnosis not present

## 2014-03-07 ENCOUNTER — Ambulatory Visit (INDEPENDENT_AMBULATORY_CARE_PROVIDER_SITE_OTHER): Payer: Medicare Other | Admitting: Cardiovascular Disease

## 2014-03-07 ENCOUNTER — Encounter: Payer: Self-pay | Admitting: Cardiovascular Disease

## 2014-03-07 VITALS — BP 102/70 | HR 47 | Ht 65.0 in | Wt 201.2 lb

## 2014-03-07 DIAGNOSIS — I480 Paroxysmal atrial fibrillation: Secondary | ICD-10-CM

## 2014-03-07 NOTE — Progress Notes (Signed)
Frank [name] Date of Birth  [birth year] [facility] [address]    [place]   [place], [place]  [address]    [place], [place]  [address] [phone]  Fax  [phone]  [phone]  Fax [phone]  Problem list: 1. Paroxysmal atrial fibrillation 2. History of syncope while driving   History of Present Illness:  [name] is a 74 yo with hx of PAF who was admitted to [facility] after having a syncopal episode while driving.  He had some warning and was able to pull the car over.   He had a 30 day monitor which did not reveal any significant arrhythmias.  He has been walking 30 minutes a day.  He has been eating "Lean Cuisine" dinners.    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  He has not had any symptoms of syncope.  He was started on diltiazem and has been doing well.  He has not had any head aches and is feeling well.    Oct. 14, 2014:  Frank [name] is doing well.  He is staying busy.  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  He get some exercise but needs to get more.  He has had problems with left thigh numbness.  The numbness has  Improved slightly .  He has lost 10 lbs over the past month.    Jul 26, 2013:  Frank [name] is doing well.  He has continued to lose some weight.  He has lost 20 lbs since his last office visit Oct. 2014.    No CP or dyspnea or syncope / presyncope.  Jan. 7, 2016:  Frank [name] is seen today for follow up hx of paroxysmal atrial fib. 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 Ascorbic Acid (VITAMIN C PO) Take 1 tablet by mouth daily.    Marland Kitchen aspirin 81 MG tablet Take 81 mg by mouth daily.     Marland Kitchen esomeprazole (NEXIUM) 40 MG capsule Take 1 capsule (40 mg total)  by mouth daily before breakfast. 30 capsule 11  . FIBER COMPLETE PO Take 1 tablet by mouth daily.    . fish oil-omega-3 fatty acids 1000 MG capsule Take 2 g by mouth daily.    . Multiple Vitamin (MULTIVITAMIN) tablet Take 1 tablet by mouth daily.     . naproxen sodium (ANAPROX) 550 MG tablet Take 550 mg by mouth as needed.      No current facility-administered medications on file prior to visit.    Allergies  Allergen Reactions  . Codeine     REACTION: unspecified  . Peanut-Containing Drug Products     PT STATES HE DOESN'T BELIEVE HE IS ALLERGIC ANYMORE.    Past Medical History  Diagnosis Date  . HA (headache)   . Hypertriglyceridemia   . Esophageal stricture   . Esophageal reflux   . Syncope     presented with atrial fib converted with Diltiazem  . PAF (paroxysmal atrial fibrillation)     Echo with normal LV function and mild LVH  . Hearing loss   . Obesity, unspecified   . Hypoglycemia   .  Hiatal hernia   . Diverticulosis     Past Surgical History  Procedure Laterality Date  . Umbilical hernia repair  1993  . Tonsillectomy    . Coronary artery bypass graft    . Nasal septum surgery      History  Smoking status  . Former Smoker  . Quit date: 03/02/1987  Smokeless tobacco  . Never Used    History  Alcohol Use No    Family History  Problem Relation Age of Onset  . Colon cancer Neg Hx     Reviw of Systems:  Reviewed in the HPI.  All other systems are negative.  Physical Exam: Blood pressure 102/70, pulse 47, height 5\' 5"  (1.651 m), weight 201 lb 3.2 oz (91.264 kg). General: Well developed, well nourished, in no acute distress.  Head: Normocephalic, atraumatic, sclera non-icteric, mucus membranes are moist,   Neck: Supple. Carotids are 2 + without bruits. No JVD  Lungs: Clear bilaterally to auscultation.  Heart: regular rate.  normal  S1 S2. No murmurs, gallops or rubs.  Abdomen: Soft, non-tender, non-distended with normal bowel sounds. No  hepatomegaly. No rebound/guarding. No masses.  Msk:  Strength and tone are normal  Extremities: No clubbing or cyanosis. No edema.  Distal pedal pulses are 2+ and equal bilaterally.  Neuro: Alert and oriented X 3. Moves all extremities spontaneously. Hard of hearing  Psych:  Responds to questions appropriately with a normal affect.  ECG: Jan. 7, 2016:  Sinus bradycardia at 47,  No St or T wave changes.   Assessment / Plan:

## 2014-03-07 NOTE — Patient Instructions (Signed)
Your physician recommends that you continue on your current medications as directed. Please refer to the Current Medication list given to you today.  Your physician wants you to follow-up in: 1 year with Dr. Nahser.  You will receive a reminder letter in the mail two months in advance. If you don't receive a letter, please call our office to schedule the follow-up appointment.  

## 2014-03-07 NOTE — Assessment & Plan Note (Signed)
[name] has done well. Has maintained NSR Will see him again in 1 year

## 2014-04-05 ENCOUNTER — Ambulatory Visit: Payer: Medicare Other | Admitting: Internal Medicine

## 2014-10-28 DIAGNOSIS — H04123 Dry eye syndrome of bilateral lacrimal glands: Secondary | ICD-10-CM | POA: Diagnosis not present

## 2014-10-28 DIAGNOSIS — H2513 Age-related nuclear cataract, bilateral: Secondary | ICD-10-CM | POA: Diagnosis not present

## 2014-11-11 DIAGNOSIS — R51 Headache: Secondary | ICD-10-CM | POA: Diagnosis not present

## 2014-11-11 DIAGNOSIS — G43019 Migraine without aura, intractable, without status migrainosus: Secondary | ICD-10-CM | POA: Diagnosis not present

## 2014-11-27 ENCOUNTER — Encounter: Payer: Self-pay | Admitting: Internal Medicine

## 2014-11-27 ENCOUNTER — Ambulatory Visit (INDEPENDENT_AMBULATORY_CARE_PROVIDER_SITE_OTHER): Payer: Medicare Other | Admitting: Internal Medicine

## 2014-11-27 VITALS — BP 104/64 | HR 60 | Ht 65.0 in | Wt 203.0 lb

## 2014-11-27 DIAGNOSIS — Z1211 Encounter for screening for malignant neoplasm of colon: Secondary | ICD-10-CM

## 2014-11-27 DIAGNOSIS — K439 Ventral hernia without obstruction or gangrene: Secondary | ICD-10-CM | POA: Diagnosis not present

## 2014-11-27 DIAGNOSIS — K219 Gastro-esophageal reflux disease without esophagitis: Secondary | ICD-10-CM

## 2014-11-27 MED ORDER — ESOMEPRAZOLE MAGNESIUM 40 MG PO CPDR: 40.0000 mg | DELAYED_RELEASE_CAPSULE | Freq: Every day | ORAL | Status: DC

## 2014-11-27 NOTE — Progress Notes (Signed)
HISTORY OF PRESENT ILLNESS:  Frank [name] is a 74 y.o. male with a history of GERD, peptic stricture, and ventral hernia. He presents today for ongoing management of his chronic GERD, evaluation of ventral hernia, and medication refill. Patient was last evaluated 11/21/2013. At that time he was doing well on Nexium 40 mg daily. He continues on Nexium 40 mg daily without difficulties. On medication his symptoms are well controlled. No recurrent dysphagia. His colon cancer screening is up-to-date with normal colonoscopy in 2007. Patient has not had difficulties with his ventral hernia though he will notice issues with lifting heavy items. He has been trying to lose weight. Actually has gained a little bit of weight since last year however. GI review of systems is otherwise negative  REVIEW OF SYSTEMS:  All non-GI ROS negative except for heart of hearing, arthritis, headaches  Past Medical History  Diagnosis Date  . HA (headache)   . Hypertriglyceridemia   . Esophageal stricture   . Esophageal reflux   . Syncope     presented with atrial fib converted with Diltiazem  . PAF (paroxysmal atrial fibrillation)     Echo with normal LV function and mild LVH  . Hearing loss   . Obesity, unspecified   . Hypoglycemia   . Hiatal hernia   . Diverticulosis     Past Surgical History  Procedure Laterality Date  . Umbilical hernia repair  1993  . Tonsillectomy    . Coronary artery bypass graft    . Nasal septum surgery      Social History ADIB WAHBA  reports that he quit smoking about 27 years ago. He has never used smokeless tobacco. He reports that he does not drink alcohol or use illicit drugs.  family history includes Emphysema in his father. There is no history of Colon cancer, Esophageal cancer, Stomach cancer, Pancreatic cancer, Diabetes, Heart disease, Liver disease, or Kidney disease.  Allergies  Allergen Reactions  . Codeine     REACTION: unspecified  . Peanut-Containing Drug  Products     PT STATES HE DOESN'T BELIEVE HE IS ALLERGIC ANYMORE.       PHYSICAL EXAMINATION: Vital signs: BP 104/64 mmHg  Pulse 60  Ht '5\' 5"'$  (1.651 m)  Wt 203 lb (92.08 kg)  BMI 33.78 kg/m2 General: Well-developed, well-nourished, no acute distress HEENT: Sclerae are anicteric, conjunctiva pink. Oral mucosa intact Lungs: Clear Heart: Regular Abdomen: soft, nontender, nondistended, no obvious ascites, no peritoneal signs, normal bowel sounds. No organomegaly. Midline ventral hernia which spontaneously reduces. Nontender. No different than last year Extremities: No clubbing cyanosis or edema Psychiatric: alert and oriented x3. Cooperative   ASSESSMENT:  #1. GERD. History of peptic stricture. The patient has good control of symptoms on daily PPI #2. Ventral hernia. Stable and asymptomatic #3. Colon cancer screening. Last colonoscopy 2007 was negative   PLAN:  #1. Reflux precautions #2. Continue Nexium 40 mg daily. Prescribed with multiple refills #3. Discussed concerns regarding long-term PPI use. His request. #4. Monitor ventral hernia #5. Screening colonoscopy due 2017. Patient aware. Contact the office in the interim for questions or problems #6. Routine office follow-up in 1 year  25 minutes spent face-to-face with the patient. The majority of the time was used for counseling him regarding his above GI diagnoses, treatment plan, and discussion of medical therapy

## 2014-11-27 NOTE — Patient Instructions (Signed)
We have sent the following medications to your pharmacy for you to pick up at your convenience:  Nexium  Please follow up in 2 years

## 2014-12-25 ENCOUNTER — Ambulatory Visit (INDEPENDENT_AMBULATORY_CARE_PROVIDER_SITE_OTHER): Payer: Medicare Other | Admitting: Internal Medicine

## 2014-12-25 ENCOUNTER — Encounter: Payer: Self-pay | Admitting: Internal Medicine

## 2014-12-25 ENCOUNTER — Other Ambulatory Visit (INDEPENDENT_AMBULATORY_CARE_PROVIDER_SITE_OTHER): Payer: Medicare Other

## 2014-12-25 VITALS — BP 110/64 | HR 54 | Temp 97.7°F | Resp 16 | Ht 65.0 in | Wt 203.0 lb

## 2014-12-25 DIAGNOSIS — E781 Pure hyperglyceridemia: Secondary | ICD-10-CM | POA: Diagnosis not present

## 2014-12-25 DIAGNOSIS — K219 Gastro-esophageal reflux disease without esophagitis: Secondary | ICD-10-CM

## 2014-12-25 DIAGNOSIS — E785 Hyperlipidemia, unspecified: Secondary | ICD-10-CM

## 2014-12-25 DIAGNOSIS — Z0000 Encounter for general adult medical examination without abnormal findings: Secondary | ICD-10-CM

## 2014-12-25 DIAGNOSIS — I480 Paroxysmal atrial fibrillation: Secondary | ICD-10-CM | POA: Diagnosis not present

## 2014-12-25 DIAGNOSIS — Z23 Encounter for immunization: Secondary | ICD-10-CM | POA: Diagnosis not present

## 2014-12-25 DIAGNOSIS — N400 Enlarged prostate without lower urinary tract symptoms: Secondary | ICD-10-CM | POA: Diagnosis not present

## 2014-12-25 LAB — LIPID PANEL
CHOLESTEROL: 160 mg/dL (ref 0–200)
HDL: 27.6 mg/dL — AB (ref >39.00)
NonHDL: 132.4
TRIGLYCERIDES: 304 mg/dL — AB (ref 0.0–149.0)
Total CHOL/HDL Ratio: 6
VLDL: 60.8 mg/dL — ABNORMAL HIGH (ref 0.0–40.0)

## 2014-12-25 LAB — COMPREHENSIVE METABOLIC PANEL
ALBUMIN: 4.1 g/dL (ref 3.5–5.2)
ALK PHOS: 65 U/L (ref 39–117)
ALT: 20 U/L (ref 0–53)
AST: 17 U/L (ref 0–37)
BUN: 13 mg/dL (ref 6–23)
CALCIUM: 9.2 mg/dL (ref 8.4–10.5)
CO2: 26 mEq/L (ref 19–32)
Chloride: 105 mEq/L (ref 96–112)
Creatinine, Ser: 0.81 mg/dL (ref 0.40–1.50)
GFR: 99.31 mL/min (ref >60.00)
Glucose, Bld: 97 mg/dL (ref 70–99)
POTASSIUM: 4 meq/L (ref 3.5–5.1)
Sodium: 140 mEq/L (ref 135–145)
TOTAL PROTEIN: 6.7 g/dL (ref 6.0–8.3)
Total Bilirubin: 0.7 mg/dL (ref 0.2–1.2)

## 2014-12-25 LAB — CBC WITH DIFFERENTIAL/PLATELET
BASOS ABS: 0 10*3/uL (ref 0.0–0.1)
BASOS PCT: 0.3 % (ref 0.0–3.0)
EOS ABS: 0.1 10*3/uL (ref 0.0–0.7)
Eosinophils Relative: 0.9 % (ref 0.0–5.0)
HCT: 44.9 % (ref 39.0–52.0)
Hemoglobin: 15.4 g/dL (ref 13.0–17.0)
LYMPHS ABS: 1.5 10*3/uL (ref 0.7–4.0)
Lymphocytes Relative: 21.5 % (ref 12.0–46.0)
MCHC: 34.3 g/dL (ref 30.0–36.0)
MCV: 95.5 fl (ref 78.0–100.0)
Monocytes Absolute: 0.6 10*3/uL (ref 0.1–1.0)
Monocytes Relative: 8.5 % (ref 3.0–12.0)
NEUTROS ABS: 4.7 10*3/uL (ref 1.4–7.7)
NEUTROS PCT: 68.8 % (ref 43.0–77.0)
PLATELETS: 211 10*3/uL (ref 150.0–400.0)
RBC: 4.7 Mil/uL (ref 4.22–5.81)
RDW: 14 % (ref 11.5–15.5)
WBC: 6.9 10*3/uL (ref 4.0–10.5)

## 2014-12-25 LAB — TSH: TSH: 1.87 u[IU]/mL (ref 0.35–4.50)

## 2014-12-25 LAB — LDL CHOLESTEROL, DIRECT: Direct LDL: 77 mg/dL

## 2014-12-25 LAB — PSA: PSA: 0.15 ng/mL (ref 0.10–4.00)

## 2014-12-25 NOTE — Patient Instructions (Signed)

## 2014-12-25 NOTE — Assessment & Plan Note (Signed)

## 2014-12-25 NOTE — Progress Notes (Signed)
Pre visit review using our clinic review tool, if applicable. No additional management support is needed unless otherwise documented below in the visit note. 

## 2014-12-25 NOTE — Progress Notes (Signed)
Subjective:  Patient ID: Frank [name], male    DOB: 05/23/1940  Age: 74 y.o. MRN: [id number]  CC: Hyperlipidemia and Annual Exam   HPI Frank [name] presents for a CPX as well as f/up on high trigs. And hx of A fib, he has been compliant with lifestyle modifications - diet, exercise, weight loss. He feels well today and offers no complaints.  Outpatient Prescriptions Prior to Visit  Medication Sig Dispense Refill  . Ascorbic Acid (VITAMIN C PO) Take 1 tablet by mouth daily.    Marland Kitchen aspirin 81 MG tablet Take 81 mg by mouth daily.     Marland Kitchen esomeprazole (NEXIUM) 40 MG capsule Take 1 capsule (40 mg total) by mouth daily before breakfast. 30 capsule 11  . FIBER COMPLETE PO Take 1 tablet by mouth daily.    . fish oil-omega-3 fatty acids 1000 MG capsule Take 2 g by mouth daily.    . Multiple Vitamin (MULTIVITAMIN) tablet Take 1 tablet by mouth daily.     . naproxen sodium (ANAPROX) 550 MG tablet Take 550 mg by mouth as needed.      No facility-administered medications prior to visit.    ROS Review of Systems  Constitutional: Negative.  Negative for fever, chills, diaphoresis, appetite change and fatigue.  HENT: Negative.   Eyes: Negative.   Respiratory: Negative.  Negative for cough, choking, chest tightness, shortness of breath and stridor.   Cardiovascular: Negative.  Negative for chest pain and leg swelling.  Gastrointestinal: Negative.  Negative for nausea, vomiting, abdominal pain, diarrhea, constipation and blood in stool.  Endocrine: Negative.   Genitourinary: Negative.  Negative for dysuria, urgency, decreased urine volume, scrotal swelling, enuresis, difficulty urinating and testicular pain.  Musculoskeletal: Negative.  Negative for myalgias, back pain, joint swelling and arthralgias.  Skin: Negative.  Negative for rash.  Allergic/Immunologic: Negative.   Neurological: Negative.  Negative for dizziness, syncope, weakness and light-headedness.  Hematological: Negative.  Negative  for adenopathy. Does not bruise/bleed easily.  Psychiatric/Behavioral: Negative.     Objective:  BP 110/64 mmHg  Pulse 54  Temp(Src) 97.7 F (36.5 C) (Oral)  Ht '5\' 5"'$  (1.651 m)  Wt 203 lb (92.08 kg)  BMI 33.78 kg/m2  SpO2 98%  BP Readings from Last 3 Encounters:  12/25/14 110/64  11/27/14 104/64  03/07/14 102/70    Wt Readings from Last 3 Encounters:  12/25/14 203 lb (92.08 kg)  11/27/14 203 lb (92.08 kg)  03/07/14 201 lb 3.2 oz (91.264 kg)    Physical Exam  Constitutional: He is oriented to person, place, and time. He appears well-developed and well-nourished. No distress.  HENT:  Head: Normocephalic and atraumatic.  Mouth/Throat: Oropharynx is clear and moist. No oropharyngeal exudate.  Eyes: Conjunctivae are normal. Right eye exhibits no discharge. Left eye exhibits no discharge. No scleral icterus.  Neck: Normal range of motion. Neck supple. No JVD present. No tracheal deviation present. No thyromegaly present.  Cardiovascular: Normal rate, regular rhythm, normal heart sounds and intact distal pulses.  Exam reveals no gallop and no friction rub.   No murmur heard. Pulmonary/Chest: Effort normal and breath sounds normal. No stridor. No respiratory distress. He has no wheezes. He has no rales. He exhibits no tenderness.  Abdominal: Soft. Bowel sounds are normal. He exhibits no distension and no mass. There is no tenderness. There is no rebound and no guarding. Hernia confirmed negative in the right inguinal area and confirmed negative in the left inguinal area.  Genitourinary: Rectum normal, testes  normal and penis normal. Rectal exam shows no external hemorrhoid, no internal hemorrhoid, no fissure, no mass, no tenderness and anal tone normal. Guaiac negative stool. Prostate is enlarged (1+ smooth symm BPH). Prostate is not tender. Right testis shows no mass, no swelling and no tenderness. Right testis is descended. Left testis shows no mass, no swelling and no tenderness. Left  testis is descended. Circumcised. No penile erythema or penile tenderness. No discharge found.  Musculoskeletal: Normal range of motion. He exhibits no edema or tenderness.  Lymphadenopathy:    He has no cervical adenopathy.       Right: No inguinal adenopathy present.       Left: No inguinal adenopathy present.  Neurological: He is oriented to person, place, and time.  Skin: Skin is warm and dry. No rash noted. He is not diaphoretic. No erythema. No pallor.  Psychiatric: He has a normal mood and affect. His behavior is normal. Judgment and thought content normal.  Vitals reviewed.   Lab Results  Component Value Date   WBC 6.9 12/25/2014   HGB 15.4 12/25/2014   HCT 44.9 12/25/2014   PLT 211.0 12/25/2014   GLUCOSE 97 12/25/2014   CHOL 160 12/25/2014   TRIG 304.0* 12/25/2014   HDL 27.60* 12/25/2014   LDLDIRECT 77.0 12/25/2014   LDLCALC 48 11/09/2013   ALT 20 12/25/2014   AST 17 12/25/2014   NA 140 12/25/2014   K 4.0 12/25/2014   CL 105 12/25/2014   CREATININE 0.81 12/25/2014   BUN 13 12/25/2014   CO2 26 12/25/2014   TSH 1.87 12/25/2014   PSA 0.15 12/25/2014   INR 1.00 04/15/2011   HGBA1C 5.5 11/28/2012    Dg Chest 2 View  04/15/2011  *RADIOLOGY REPORT* Clinical Data: Syncopal episode. CHEST - 2 VIEW Comparison: Portable chest 09/13/2005. Findings: The heart is mildly enlarged.  There is no edema or effusion to suggest failure.  Mild degenerative changes are noted in the thoracic spine.  The lungs are clear.  Mild interstitial coarsening is likely chronic. IMPRESSION: 1.  Borderline cardiomegaly without failure. 2.  No acute cardiopulmonary disease. Original Report Authenticated By: Resa Miner. MATTERN, M.D.   Assessment & Plan:   Phong was seen today for hyperlipidemia and annual exam.  Diagnoses and all orders for this visit:  Paroxysmal atrial fibrillation ([place])- he had an episode of A fib about 3 years ago and apparently no recurrence since then, he is not willing  to be anticoagulated. He does not report any palpitations and is not interested in a cardiology f/up. He will let me know if he develops any suspicious symptoms. 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  Gastroesophageal reflux disease without esophagitis -     CBC with Differential/Platelet; Future  Need for influenza vaccination -     Flu Vaccine QUAD 36+ mos IM  Routine general medical examination at a health care facility  I am  having Mr. Ferch start on Icosapent Ethyl. I am also having him maintain his naproxen sodium, multivitamin, fish oil-omega-3 fatty acids, Ascorbic Acid (VITAMIN C PO), FIBER COMPLETE PO, aspirin, and esomeprazole.  Meds ordered this encounter  Medications  . Icosapent Ethyl (VASCEPA) 1 G CAPS    Sig: Take 2 capsules by mouth 2 (two) times daily.    Dispense:  120 capsule    Refill:  11     Follow-up: Return in about 1 year (around 12/25/2015).  Scarlette Calico, MD

## 2014-12-26 ENCOUNTER — Encounter: Payer: Self-pay | Admitting: Internal Medicine

## 2014-12-26 MED ORDER — ICOSAPENT ETHYL 1 G PO CAPS: 2.0000 | ORAL_CAPSULE | Freq: Two times a day (BID) | ORAL | Status: DC

## 2014-12-26 MED ORDER — ATORVASTATIN CALCIUM 20 MG PO TABS: 20.0000 mg | ORAL_TABLET | Freq: Every day | ORAL | Status: DC

## 2015-02-19 ENCOUNTER — Telehealth: Payer: Self-pay | Admitting: *Deleted

## 2015-02-19 NOTE — Telephone Encounter (Signed)
called to ask if he should be taking 4 81 mg baby aspirin at a time, advised pt to ask his doctor when he came for his visit. pt agreeable to plan.

## 2015-02-20 ENCOUNTER — Ambulatory Visit (INDEPENDENT_AMBULATORY_CARE_PROVIDER_SITE_OTHER): Payer: Medicare Other | Admitting: Internal Medicine

## 2015-02-20 ENCOUNTER — Encounter: Payer: Self-pay | Admitting: Internal Medicine

## 2015-02-20 VITALS — BP 130/70 | HR 52 | Temp 97.5°F | Ht 65.0 in | Wt 204.0 lb

## 2015-02-20 DIAGNOSIS — N400 Enlarged prostate without lower urinary tract symptoms: Secondary | ICD-10-CM | POA: Diagnosis not present

## 2015-02-20 DIAGNOSIS — R339 Retention of urine, unspecified: Secondary | ICD-10-CM | POA: Diagnosis not present

## 2015-02-20 LAB — POCT URINALYSIS DIPSTICK
BILIRUBIN UA: NEGATIVE
Blood, UA: NEGATIVE
Glucose, UA: NEGATIVE
KETONES UA: NEGATIVE
LEUKOCYTES UA: NEGATIVE
NITRITE UA: NEGATIVE
Protein, UA: NEGATIVE
Spec Grav, UA: 1.025
Urobilinogen, UA: 0.2
pH, UA: 6

## 2015-02-20 MED ORDER — SILODOSIN 8 MG PO CAPS: 8.0000 mg | ORAL_CAPSULE | Freq: Every day | ORAL | Status: DC

## 2015-02-20 NOTE — Progress Notes (Signed)
Subjective:  Patient ID: Frank [name], male    DOB: 06/08/1940  Age: 74 y.o. MRN: [id number]  CC: Benign Prostatic Hypertrophy   HPI Frank [name] presents for difficulty emptying his bladder. He tells me that he has had a hard time urinating for several years but it has gotten worse over the last 2-3 weeks. He describes getting up about 2-3 times a night to urinate. During the day he feels like he has to strain to urinate and his bladder does not completely empty. He does not have any urgency or dysuria.  Outpatient Prescriptions Prior to Visit  Medication Sig Dispense Refill  . aspirin 81 MG tablet Take 81 mg by mouth daily.     Marland Kitchen atorvastatin (LIPITOR) 20 MG tablet Take 1 tablet (20 mg total) by mouth daily. 90 tablet 3  . esomeprazole (NEXIUM) 40 MG capsule Take 1 capsule (40 mg total) by mouth daily before breakfast. 30 capsule 11  . Icosapent Ethyl (VASCEPA) 1 G CAPS Take 2 capsules by mouth 2 (two) times daily. 120 capsule 11  . Multiple Vitamin (MULTIVITAMIN) tablet Take 1 tablet by mouth daily.     . naproxen sodium (ANAPROX) 550 MG tablet Take 550 mg by mouth as needed.     . Ascorbic Acid (VITAMIN C PO) Take 1 tablet by mouth daily.    Marland Kitchen FIBER COMPLETE PO Take 1 tablet by mouth daily.    . fish oil-omega-3 fatty acids 1000 MG capsule Take 2 g by mouth daily.     No facility-administered medications prior to visit.    ROS Review of Systems  Constitutional: Negative.  Negative for fever, chills, diaphoresis, appetite change and fatigue.  HENT: Negative.   Eyes: Negative.   Respiratory: Negative.  Negative for cough, choking, chest tightness, shortness of breath and stridor.   Cardiovascular: Negative.  Negative for chest pain, palpitations and leg swelling.  Gastrointestinal: Negative.  Negative for nausea, vomiting, abdominal pain, diarrhea, constipation and blood in stool.  Endocrine: Negative.   Genitourinary: Positive for difficulty urinating. Negative for  dysuria, urgency, frequency, hematuria, flank pain, decreased urine volume, discharge, penile swelling, scrotal swelling, enuresis, penile pain and testicular pain.  Musculoskeletal: Negative.   Skin: Negative.  Negative for color change and rash.  Allergic/Immunologic: Negative.   Neurological: Negative.  Negative for dizziness.  Hematological: Negative.  Negative for adenopathy. Does not bruise/bleed easily.  Psychiatric/Behavioral: Negative.     Objective:  BP 130/70 mmHg  Pulse 52  Temp(Src) 97.5 F (36.4 C) (Oral)  Ht '5\' 5"'$  (1.651 m)  Wt 204 lb (92.534 kg)  BMI 33.95 kg/m2  SpO2 97%  BP Readings from Last 3 Encounters:  02/20/15 130/70  12/25/14 110/64  11/27/14 104/64    Wt Readings from Last 3 Encounters:  02/20/15 204 lb (92.534 kg)  12/25/14 203 lb (92.08 kg)  11/27/14 203 lb (92.08 kg)    Physical Exam  Constitutional: He is oriented to person, place, and time. No distress.  HENT:  Nose: Nose normal.  Mouth/Throat: Oropharynx is clear and moist. No oropharyngeal exudate.  Eyes: Conjunctivae are normal. Right eye exhibits no discharge. Left eye exhibits no discharge. No scleral icterus.  Neck: Normal range of motion. Neck supple. No JVD present. No tracheal deviation present. No thyromegaly present.  Cardiovascular: Normal rate, regular rhythm, normal heart sounds and intact distal pulses.  Exam reveals no gallop and no friction rub.   No murmur heard. Pulmonary/Chest: Effort normal and breath sounds normal. No  stridor. No respiratory distress. He has no wheezes. He has no rales. He exhibits no tenderness.  Abdominal: Soft. Bowel sounds are normal. He exhibits no distension and no mass. There is no tenderness. There is no rebound and no guarding. Hernia confirmed negative in the right inguinal area and confirmed negative in the left inguinal area.  Genitourinary: Rectum normal, testes normal and penis normal. Rectal exam shows no external hemorrhoid, no internal  hemorrhoid, no fissure, no mass, no tenderness and anal tone normal. Guaiac negative stool. Prostate is enlarged (1+ smooth symm BPH). Prostate is not tender. Right testis shows no mass, no swelling and no tenderness. Right testis is descended. Left testis shows no mass, no swelling and no tenderness. Left testis is descended. Circumcised. No penile erythema or penile tenderness. No discharge found.  Musculoskeletal: Normal range of motion. He exhibits no edema or tenderness.  Lymphadenopathy:    He has no cervical adenopathy.       Right: No inguinal adenopathy present.       Left: No inguinal adenopathy present.  Neurological: He is oriented to person, place, and time.  Skin: Skin is warm and dry. No rash noted. He is not diaphoretic. No erythema. No pallor.  Vitals reviewed.   Lab Results  Component Value Date   WBC 6.9 12/25/2014   HGB 15.4 12/25/2014   HCT 44.9 12/25/2014   PLT 211.0 12/25/2014   GLUCOSE 97 12/25/2014   CHOL 160 12/25/2014   TRIG 304.0* 12/25/2014   HDL 27.60* 12/25/2014   LDLDIRECT 77.0 12/25/2014   LDLCALC 48 11/09/2013   ALT 20 12/25/2014   AST 17 12/25/2014   NA 140 12/25/2014   K 4.0 12/25/2014   CL 105 12/25/2014   CREATININE 0.81 12/25/2014   BUN 13 12/25/2014   CO2 26 12/25/2014   TSH 1.87 12/25/2014   PSA 0.15 12/25/2014   INR 1.00 04/15/2011   HGBA1C 5.5 11/28/2012    Dg Chest 2 View  04/15/2011  *RADIOLOGY REPORT* Clinical Data: Syncopal episode. CHEST - 2 VIEW Comparison: Portable chest 09/13/2005. Findings: The heart is mildly enlarged.  There is no edema or effusion to suggest failure.  Mild degenerative changes are noted in the thoracic spine.  The lungs are clear.  Mild interstitial coarsening is likely chronic. IMPRESSION: 1.  Borderline cardiomegaly without failure. 2.  No acute cardiopulmonary disease. Original Report Authenticated By: Resa Miner. MATTERN, M.D.   Assessment & Plan:   Frank [name] was seen today for benign prostatic  hypertrophy.  Diagnoses and all orders for this visit:  BPH (benign prostatic hyperplasia)- his symptoms, exam, and urinalysis do not indicate any evidence of a prostate gland infection. His PSA was recently 0.15, so I am not suspicious that he has prostate cancer. Will treat his BPH with Rapaflo 1 capsule once a day. -     silodosin (RAPAFLO) 8 MG CAPS capsule; Take 1 capsule (8 mg total) by mouth daily with breakfast.  Urine retention -     POCT urinalysis dipstick  I have discontinued Frank [name] fish oil-omega-3 fatty acids, Ascorbic Acid (VITAMIN C PO), and FIBER COMPLETE PO. I am also having him start on silodosin. Additionally, I am having him maintain his naproxen sodium, multivitamin, aspirin, esomeprazole, Icosapent Ethyl, and atorvastatin.  Meds ordered this encounter  Medications  . silodosin (RAPAFLO) 8 MG CAPS capsule    Sig: Take 1 capsule (8 mg total) by mouth daily with breakfast.    Dispense:  30 capsule  Refill:  11     Follow-up: Return in about 2 months (around 04/23/2015).  Scarlette Calico, MD

## 2015-02-20 NOTE — Patient Instructions (Signed)
Benign Prostatic Hyperplasia An enlarged prostate (benign prostatic hyperplasia) is common in older men. You may experience the following:  Weak urine stream.  Dribbling.  Feeling like the bladder has not emptied completely.  Difficulty starting urination.  Getting up frequently at night to urinate.  Urinating more frequently during the day. HOME CARE INSTRUCTIONS  Monitor your prostatic hyperplasia for any changes. The following actions may help to alleviate any discomfort you are experiencing:  Give yourself time when you urinate.  Stay away from alcohol.  Avoid beverages containing caffeine, such as coffee, tea, and colas, because they can make the problem worse.  Avoid decongestants, antihistamines, and some prescription medicines that can make the problem worse.  Follow up with your health care provider for further treatment as recommended. SEEK MEDICAL CARE IF:  You are experiencing progressive difficulty voiding.  Your urine stream is progressively getting narrower.  You are awaking from sleep with the urge to void more frequently.  You are constantly feeling the need to void.  You experience loss of urine, especially in small amounts. SEEK IMMEDIATE MEDICAL CARE IF:   You develop increased pain with urination or are unable to urinate.  You develop severe abdominal pain, vomiting, a high fever, or fainting.  You develop back pain or blood in your urine. MAKE SURE YOU:   Understand these instructions.  Will watch your condition.  Will get help right away if you are not doing well or get worse.   This information is not intended to replace advice given to you by your health care provider. Make sure you discuss any questions you have with your health care provider.   Document Released: 02/15/2005 Document Revised: 03/08/2014 Document Reviewed: 07/18/2012 Elsevier Interactive Patient Education 2016 [employer]vier Inc[employer]  

## 2015-02-20 NOTE — Progress Notes (Signed)
Pre visit review using our clinic review tool, if applicable. No additional management support is needed unless otherwise documented below in the visit note. 

## 2015-02-27 ENCOUNTER — Encounter: Payer: Self-pay | Admitting: *Deleted

## 2015-03-20 ENCOUNTER — Encounter: Payer: Self-pay | Admitting: Cardiovascular Disease

## 2015-03-20 ENCOUNTER — Ambulatory Visit (INDEPENDENT_AMBULATORY_CARE_PROVIDER_SITE_OTHER): Payer: Medicare Other | Admitting: Cardiovascular Disease

## 2015-03-20 VITALS — BP 116/68 | HR 56 | Ht 67.5 in | Wt 202.8 lb

## 2015-03-20 DIAGNOSIS — I480 Paroxysmal atrial fibrillation: Secondary | ICD-10-CM | POA: Diagnosis not present

## 2015-03-20 NOTE — Patient Instructions (Signed)
Medication Instructions:  Your physician recommends that you continue on your current medications as directed. Please refer to the Current Medication list given to you today.   Labwork: None Ordered   Testing/Procedures: None Ordered   Follow-Up: Your physician wants you to follow-up in: 1 year with Dr. Nahser.  You will receive a reminder letter in the mail two months in advance. If you don't receive a letter, please call our office to schedule the follow-up appointment.   If you need a refill on your cardiac medications before your next appointment, please call your pharmacy.   Thank you for choosing [facility]! Raenah Murley, RN [phone]    
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
## 2015-04-23 ENCOUNTER — Encounter: Payer: Self-pay | Admitting: Internal Medicine

## 2015-04-23 ENCOUNTER — Other Ambulatory Visit (INDEPENDENT_AMBULATORY_CARE_PROVIDER_SITE_OTHER): Payer: Medicare Other

## 2015-04-23 ENCOUNTER — Ambulatory Visit (INDEPENDENT_AMBULATORY_CARE_PROVIDER_SITE_OTHER): Payer: Medicare Other | Admitting: Internal Medicine

## 2015-04-23 VITALS — BP 110/68 | HR 98 | Temp 97.6°F | Resp 16 | Ht 67.5 in | Wt 204.0 lb

## 2015-04-23 DIAGNOSIS — R7309 Other abnormal glucose: Secondary | ICD-10-CM | POA: Diagnosis not present

## 2015-04-23 DIAGNOSIS — N5201 Erectile dysfunction due to arterial insufficiency: Secondary | ICD-10-CM | POA: Diagnosis not present

## 2015-04-23 DIAGNOSIS — E785 Hyperlipidemia, unspecified: Secondary | ICD-10-CM

## 2015-04-23 DIAGNOSIS — E781 Pure hyperglyceridemia: Secondary | ICD-10-CM

## 2015-04-23 HISTORY — DX: Erectile dysfunction due to arterial insufficiency: N52.01

## 2015-04-23 LAB — BASIC METABOLIC PANEL
BUN: 10 mg/dL (ref 6–23)
CALCIUM: 9.1 mg/dL (ref 8.4–10.5)
CO2: 27 meq/L (ref 19–32)
CREATININE: 0.73 mg/dL (ref 0.40–1.50)
Chloride: 105 mEq/L (ref 96–112)
GFR: 111.87 mL/min (ref >60.00)
Glucose, Bld: 108 mg/dL — ABNORMAL HIGH (ref 70–99)
Potassium: 4 mEq/L (ref 3.5–5.1)
SODIUM: 138 meq/L (ref 135–145)

## 2015-04-23 LAB — LIPID PANEL
CHOLESTEROL: 174 mg/dL (ref 0–200)
HDL: 29.6 mg/dL — AB (ref >39.00)
NONHDL: 144.44
TRIGLYCERIDES: 362 mg/dL — AB (ref 0.0–149.0)
Total CHOL/HDL Ratio: 6
VLDL: 72.4 mg/dL — ABNORMAL HIGH (ref 0.0–40.0)

## 2015-04-23 LAB — LDL CHOLESTEROL, DIRECT: Direct LDL: 91 mg/dL

## 2015-04-23 MED ORDER — SILDENAFIL CITRATE 100 MG PO TABS: 100.0000 mg | ORAL_TABLET | ORAL | Status: DC | PRN

## 2015-04-23 NOTE — Patient Instructions (Signed)

## 2015-04-23 NOTE — Progress Notes (Signed)
Subjective:  Patient ID: ROME [name], male    DOB: Jan 22, 1941  Age: 75 y.o. MRN: [id number]  CC: Hyperlipidemia and Benign Prostatic Hypertrophy   HPI Frank [name] presents for a follow-up on his cholesterol level and BPH. He complains of nocturia about 1 times per night but says his urine flow is normal during the day.  On his last lipid panel he had a slightly elevated triglyceride level which he has been treating intermittently with omega-3 fish oils. He is tolerating the statin and the fish oil well with no myalgias or arthralgias.  Outpatient Prescriptions Prior to Visit  Medication Sig Dispense Refill  . aspirin 81 MG tablet Take 81 mg by mouth daily.     Marland Kitchen atorvastatin (LIPITOR) 20 MG tablet Take 1 tablet (20 mg total) by mouth daily. 90 tablet 3  . esomeprazole (NEXIUM) 40 MG capsule Take 1 capsule (40 mg total) by mouth daily before breakfast. 30 capsule 11  . Icosapent Ethyl (VASCEPA) 1 G CAPS Take 2 capsules by mouth 2 (two) times daily. 120 capsule 11  . Multiple Vitamin (MULTIVITAMIN) tablet Take 1 tablet by mouth daily.     . naproxen sodium (ANAPROX) 550 MG tablet Take 550 mg by mouth as needed for moderate pain.     . silodosin (RAPAFLO) 8 MG CAPS capsule Take 1 capsule (8 mg total) by mouth daily with breakfast. 30 capsule 11   No facility-administered medications prior to visit.    ROS Review of Systems  Constitutional: Negative.  Negative for fever, chills, diaphoresis, appetite change and fatigue.  HENT: Negative.   Eyes: Negative.   Respiratory: Negative.  Negative for cough, choking, chest tightness, shortness of breath and stridor.   Cardiovascular: Negative.  Negative for chest pain, palpitations and leg swelling.  Gastrointestinal: Negative.  Negative for nausea, abdominal pain, diarrhea, constipation and blood in stool.  Endocrine: Negative.   Genitourinary: Negative.  Negative for dysuria, urgency, frequency, hematuria, flank pain, enuresis and  testicular pain.       He complains of nocturia, 1 time per night and erectile dysfunction. He wants to try Viagra.  Musculoskeletal: Negative.  Negative for myalgias, back pain, joint swelling and arthralgias.  Skin: Negative.   Allergic/Immunologic: Negative.   Neurological: Negative.  Negative for dizziness, tremors, facial asymmetry, weakness, light-headedness, numbness and headaches.  Hematological: Negative.  Negative for adenopathy. Does not bruise/bleed easily.  Psychiatric/Behavioral: Negative.     Objective:  BP 110/68 mmHg  Pulse 98  Temp(Src) 97.6 F (36.4 C) (Oral)  Resp 16  Ht 5' 7.5" (1.715 m)  Wt 204 lb (92.534 kg)  BMI 31.46 kg/m2  SpO2 97%  BP Readings from Last 3 Encounters:  04/23/15 110/68  03/20/15 116/68  02/20/15 130/70    Wt Readings from Last 3 Encounters:  04/23/15 204 lb (92.534 kg)  03/20/15 202 lb 12.8 oz (91.989 kg)  02/20/15 204 lb (92.534 kg)    Physical Exam  Constitutional: He is oriented to person, place, and time. He appears well-developed and well-nourished. No distress.  HENT:  Head: Normocephalic and atraumatic.  Mouth/Throat: Oropharynx is clear and moist. No oropharyngeal exudate.  Eyes: Conjunctivae are normal. Right eye exhibits no discharge. Left eye exhibits no discharge. No scleral icterus.  Neck: Normal range of motion. Neck supple. No JVD present. No tracheal deviation present. No thyromegaly present.  Cardiovascular: Normal rate, regular rhythm, normal heart sounds and intact distal pulses.  Exam reveals no gallop and no friction rub.  No murmur heard. Pulmonary/Chest: Effort normal and breath sounds normal. No stridor. No respiratory distress. He has no wheezes. He has no rales. He exhibits no tenderness.  Abdominal: Soft. Bowel sounds are normal. He exhibits no distension and no mass. There is no tenderness. There is no rebound and no guarding.  Musculoskeletal: Normal range of motion. He exhibits no edema or tenderness.   Lymphadenopathy:    He has no cervical adenopathy.  Neurological: He is oriented to person, place, and time.  Skin: Skin is warm and dry. No rash noted. He is not diaphoretic. No erythema. No pallor.  Vitals reviewed.   Lab Results  Component Value Date   WBC 6.9 12/25/2014   HGB 15.4 12/25/2014   HCT 44.9 12/25/2014   PLT 211.0 12/25/2014   GLUCOSE 108* 04/23/2015   CHOL 174 04/23/2015   TRIG 362.0* 04/23/2015   HDL 29.60* 04/23/2015   LDLDIRECT 91.0 04/23/2015   LDLCALC 48 11/09/2013   ALT 20 12/25/2014   AST 17 12/25/2014   NA 138 04/23/2015   K 4.0 04/23/2015   CL 105 04/23/2015   CREATININE 0.73 04/23/2015   BUN 10 04/23/2015   CO2 27 04/23/2015   TSH 1.87 12/25/2014   PSA 0.15 12/25/2014   INR 1.00 04/15/2011   HGBA1C 5.5 11/28/2012    Dg Chest 2 View  04/15/2011  *RADIOLOGY REPORT* Clinical Data: Syncopal episode. CHEST - 2 VIEW Comparison: Portable chest 09/13/2005. Findings: The heart is mildly enlarged.  There is no edema or effusion to suggest failure.  Mild degenerative changes are noted in the thoracic spine.  The lungs are clear.  Mild interstitial coarsening is likely chronic. IMPRESSION: 1.  Borderline cardiomegaly without failure. 2.  No acute cardiopulmonary disease. Original Report Authenticated By: Resa Miner. MATTERN, M.D.   Assessment & Plan:   Tashawn was seen today for hyperlipidemia and benign prostatic hypertrophy.  Diagnoses and all orders for this visit:  Hyperlipidemia with target LDL less than 70- he is doing well on the statin and has achieved his LDL goal -     Lipid panel; Future  HYPERTRIGLYCERIDEMIA- his triglycerides are not well-controlled, I have asked him to be more compliant with lifestyle modifications and with the omega-3 fish oil -     Lipid panel; Future  Other abnormal glucose- he has prediabetes, this is stable and does not require treatment at this time. -     Basic metabolic panel; Future  Erectile dysfunction due  to arterial insufficiency- he will try Viagra as needed -     sildenafil (VIAGRA) 100 MG tablet; Take 1 tablet (100 mg total) by mouth as needed for erectile dysfunction.  I am having Mr. Apperson start on sildenafil. I am also having him maintain his naproxen sodium, multivitamin, aspirin, esomeprazole, Icosapent Ethyl, atorvastatin, and silodosin.  Meds ordered this encounter  Medications  . sildenafil (VIAGRA) 100 MG tablet    Sig: Take 1 tablet (100 mg total) by mouth as needed for erectile dysfunction.    Dispense:  6 tablet    Refill:  11     Follow-up: Return in about 6 months (around 10/21/2015).  Scarlette Calico, MD

## 2015-04-23 NOTE — Progress Notes (Signed)
Pre visit review using our clinic review tool, if applicable. No additional management support is needed unless otherwise documented below in the visit note. 

## 2015-06-04 ENCOUNTER — Encounter: Payer: Self-pay | Admitting: Internal Medicine

## 2015-06-04 ENCOUNTER — Other Ambulatory Visit (INDEPENDENT_AMBULATORY_CARE_PROVIDER_SITE_OTHER): Payer: Medicare Other

## 2015-06-04 ENCOUNTER — Ambulatory Visit (INDEPENDENT_AMBULATORY_CARE_PROVIDER_SITE_OTHER): Payer: Medicare Other | Admitting: Internal Medicine

## 2015-06-04 VITALS — BP 100/60 | HR 70 | Temp 98.0°F | Resp 16 | Ht 67.5 in | Wt 198.0 lb

## 2015-06-04 DIAGNOSIS — E781 Pure hyperglyceridemia: Secondary | ICD-10-CM | POA: Diagnosis not present

## 2015-06-04 DIAGNOSIS — R197 Diarrhea, unspecified: Secondary | ICD-10-CM | POA: Diagnosis not present

## 2015-06-04 LAB — CBC WITH DIFFERENTIAL/PLATELET
BASOS ABS: 0 10*3/uL (ref 0.0–0.1)
Basophils Relative: 0.3 % (ref 0.0–3.0)
EOS ABS: 0 10*3/uL (ref 0.0–0.7)
Eosinophils Relative: 0.7 % (ref 0.0–5.0)
HEMATOCRIT: 39.7 % (ref 39.0–52.0)
HEMOGLOBIN: 13.5 g/dL (ref 13.0–17.0)
Lymphocytes Relative: 19.8 % (ref 12.0–46.0)
Lymphs Abs: 1.3 10*3/uL (ref 0.7–4.0)
MCHC: 34.1 g/dL (ref 30.0–36.0)
MCV: 94 fl (ref 78.0–100.0)
MONO ABS: 0.6 10*3/uL (ref 0.1–1.0)
MONOS PCT: 9.3 % (ref 3.0–12.0)
Neutro Abs: 4.5 10*3/uL (ref 1.4–7.7)
Neutrophils Relative %: 69.9 % (ref 43.0–77.0)
Platelets: 223 10*3/uL (ref 150.0–400.0)
RBC: 4.23 Mil/uL (ref 4.22–5.81)
RDW: 14.6 % (ref 11.5–15.5)
WBC: 6.4 10*3/uL (ref 4.0–10.5)

## 2015-06-04 LAB — BASIC METABOLIC PANEL
BUN: 13 mg/dL (ref 6–23)
CALCIUM: 9.2 mg/dL (ref 8.4–10.5)
CO2: 26 meq/L (ref 19–32)
CREATININE: 0.73 mg/dL (ref 0.40–1.50)
Chloride: 105 mEq/L (ref 96–112)
GFR: 111.83 mL/min (ref >60.00)
GLUCOSE: 108 mg/dL — AB (ref 70–99)
Potassium: 3.4 mEq/L — ABNORMAL LOW (ref 3.5–5.1)
Sodium: 139 mEq/L (ref 135–145)

## 2015-06-04 LAB — SEDIMENTATION RATE: SED RATE: 26 mm/h — AB (ref 0–22)

## 2015-06-04 MED ORDER — OMEGA-3-ACID ETHYL ESTERS 1 G PO CAPS: 2.0000 g | ORAL_CAPSULE | Freq: Two times a day (BID) | ORAL | Status: DC

## 2015-06-04 NOTE — Progress Notes (Signed)
Subjective:  Patient ID: Frank [name], male    DOB: 1940/04/26  Age: 75 y.o. MRN: [id number]  CC: Diarrhea   HPI Frank [name] presents for a one-week history of intermittent diarrhea. He describes about 2-3 loose bowel movements a day. All in all his symptoms are getting better. He has gotten some symptom relief with Pepto-Bismol. He denies loss of appetite, weight loss, nausea, vomiting, bloody stool, cramping, fever, or chills.  He also complains that his current fish oil supplement is too expensive and he wants to change to a generic.  Outpatient Prescriptions Prior to Visit  Medication Sig Dispense Refill  . aspirin 81 MG tablet Take 81 mg by mouth daily.     Marland Kitchen atorvastatin (LIPITOR) 20 MG tablet Take 1 tablet (20 mg total) by mouth daily. 90 tablet 3  . esomeprazole (NEXIUM) 40 MG capsule Take 1 capsule (40 mg total) by mouth daily before breakfast. 30 capsule 11  . Multiple Vitamin (MULTIVITAMIN) tablet Take 1 tablet by mouth daily.     . naproxen sodium (ANAPROX) 550 MG tablet Take 550 mg by mouth as needed for moderate pain.     . sildenafil (VIAGRA) 100 MG tablet Take 1 tablet (100 mg total) by mouth as needed for erectile dysfunction. 6 tablet 11  . silodosin (RAPAFLO) 8 MG CAPS capsule Take 1 capsule (8 mg total) by mouth daily with breakfast. 30 capsule 11  . Icosapent Ethyl (VASCEPA) 1 G CAPS Take 2 capsules by mouth 2 (two) times daily. 120 capsule 11   No facility-administered medications prior to visit.    ROS Review of Systems  Constitutional: Negative.  Negative for fever, chills, diaphoresis, appetite change and fatigue.  HENT: Negative.  Negative for trouble swallowing.   Eyes: Negative.   Respiratory: Negative.  Negative for cough, choking, chest tightness, shortness of breath and stridor.   Cardiovascular: Negative.  Negative for chest pain, palpitations and leg swelling.  Gastrointestinal: Positive for diarrhea. Negative for nausea, vomiting, abdominal  pain, constipation and blood in stool.  Endocrine: Negative.   Genitourinary: Negative.  Negative for dysuria, urgency, hematuria and difficulty urinating.  Musculoskeletal: Negative.  Negative for myalgias, back pain, joint swelling, arthralgias and neck pain.  Skin: Negative.  Negative for color change and rash.  Allergic/Immunologic: Negative.   Neurological: Negative.  Negative for dizziness and weakness.  Hematological: Negative.  Negative for adenopathy. Does not bruise/bleed easily.  Psychiatric/Behavioral: Negative.     Objective:  BP 100/60 mmHg  Pulse 70  Temp(Src) 98 F (36.7 C) (Oral)  Resp 16  Ht 5' 7.5" (1.715 m)  Wt 198 lb (89.812 kg)  BMI 30.54 kg/m2  SpO2 96%  BP Readings from Last 3 Encounters:  06/04/15 100/60  04/23/15 110/68  03/20/15 116/68    Wt Readings from Last 3 Encounters:  06/04/15 198 lb (89.812 kg)  04/23/15 204 lb (92.534 kg)  03/20/15 202 lb 12.8 oz (91.989 kg)    Physical Exam  Constitutional: He is oriented to person, place, and time.  Non-toxic appearance. He does not have a sickly appearance. He does not appear ill. No distress.  HENT:  Mouth/Throat: Oropharynx is clear and moist. No oropharyngeal exudate.  Eyes: Conjunctivae are normal. Right eye exhibits no discharge. Left eye exhibits no discharge. No scleral icterus.  Neck: Normal range of motion. Neck supple. No JVD present. No tracheal deviation present. No thyromegaly present.  Cardiovascular: Normal rate, regular rhythm, normal heart sounds and intact distal pulses.  Exam  reveals no gallop and no friction rub.   No murmur heard. Pulmonary/Chest: Effort normal and breath sounds normal. No stridor. No respiratory distress. He has no wheezes. He has no rales. He exhibits no tenderness.  Abdominal: Soft. Bowel sounds are normal. He exhibits no distension and no mass. There is no tenderness. There is no rebound and no guarding.  Musculoskeletal: Normal range of motion. He exhibits no  edema or tenderness.  Lymphadenopathy:    He has no cervical adenopathy.  Neurological: He is oriented to person, place, and time.  Skin: Skin is warm and dry. No rash noted. He is not diaphoretic. No erythema. No pallor.  Vitals reviewed.   Lab Results  Component Value Date   WBC 6.4 06/04/2015   HGB 13.5 06/04/2015   HCT 39.7 06/04/2015   PLT 223.0 06/04/2015   GLUCOSE 108* 06/04/2015   CHOL 174 04/23/2015   TRIG 362.0* 04/23/2015   HDL 29.60* 04/23/2015   LDLDIRECT 91.0 04/23/2015   LDLCALC 48 11/09/2013   ALT 20 12/25/2014   AST 17 12/25/2014   NA 139 06/04/2015   K 3.4* 06/04/2015   CL 105 06/04/2015   CREATININE 0.73 06/04/2015   BUN 13 06/04/2015   CO2 26 06/04/2015   TSH 1.87 12/25/2014   PSA 0.15 12/25/2014   INR 1.00 04/15/2011   HGBA1C 5.5 11/28/2012    Dg Chest 2 View  04/15/2011  *RADIOLOGY REPORT* Clinical Data: Syncopal episode. CHEST - 2 VIEW Comparison: Portable chest 09/13/2005. Findings: The heart is mildly enlarged.  There is no edema or effusion to suggest failure.  Mild degenerative changes are noted in the thoracic spine.  The lungs are clear.  Mild interstitial coarsening is likely chronic. IMPRESSION: 1.  Borderline cardiomegaly without failure. 2.  No acute cardiopulmonary disease. Original Report Authenticated By: Resa Miner. MATTERN, M.D.   Assessment & Plan:   Frank [name] was seen today for diarrhea.  Diagnoses and all orders for this visit:  HYPERTRIGLYCERIDEMIA -     omega-3 acid ethyl esters (LOVAZA) 1 g capsule; Take 2 capsules (2 g total) by mouth 2 (two) times daily.  Diarrhea, unspecified type- His sedimentation rate is not significantly elevated so I don't think he has inflammatory bowel disease, his CBC is within normal limits so I am not suspicious of intestinal infection such as C. difficile colitis, his renal function and electrolytes only show mild hypokalemia. I have asked him to address that with an increase intake of fruits and  vegetables. His signs and symptoms are most consistent with an acute viral gastroenteritis that is resolving. -     Basic metabolic panel; Future -     CBC with Differential/Platelet; Future -     Sedimentation rate; Future   I have discontinued Mr. Kruer Icosapent Ethyl. I am also having him start on omega-3 acid ethyl esters. Additionally, I am having him maintain his naproxen sodium, multivitamin, aspirin, esomeprazole, atorvastatin, silodosin, and sildenafil.  Meds ordered this encounter  Medications  . omega-3 acid ethyl esters (LOVAZA) 1 g capsule    Sig: Take 2 capsules (2 g total) by mouth 2 (two) times daily.    Dispense:  120 capsule    Refill:  11     Follow-up: Return in about 3 weeks (around 06/25/2015).  Scarlette Calico, MD

## 2015-06-04 NOTE — Progress Notes (Signed)
Pre visit review using our clinic review tool, if applicable. No additional management support is needed unless otherwise documented below in the visit note. 

## 2015-06-04 NOTE — Patient Instructions (Signed)

## 2015-11-13 DIAGNOSIS — R51 Headache: Secondary | ICD-10-CM | POA: Diagnosis not present

## 2015-11-13 DIAGNOSIS — G43019 Migraine without aura, intractable, without status migrainosus: Secondary | ICD-10-CM | POA: Diagnosis not present

## 2015-11-17 DIAGNOSIS — H524 Presbyopia: Secondary | ICD-10-CM | POA: Diagnosis not present

## 2015-11-17 DIAGNOSIS — H2513 Age-related nuclear cataract, bilateral: Secondary | ICD-10-CM | POA: Diagnosis not present

## 2015-12-10 ENCOUNTER — Encounter: Payer: Self-pay | Admitting: Internal Medicine

## 2015-12-10 ENCOUNTER — Ambulatory Visit (INDEPENDENT_AMBULATORY_CARE_PROVIDER_SITE_OTHER): Payer: Medicare Other | Admitting: Internal Medicine

## 2015-12-10 VITALS — BP 118/76 | HR 74 | Ht 67.5 in | Wt 205.4 lb

## 2015-12-10 DIAGNOSIS — Z1211 Encounter for screening for malignant neoplasm of colon: Secondary | ICD-10-CM

## 2015-12-10 DIAGNOSIS — K219 Gastro-esophageal reflux disease without esophagitis: Secondary | ICD-10-CM | POA: Diagnosis not present

## 2015-12-10 DIAGNOSIS — K439 Ventral hernia without obstruction or gangrene: Secondary | ICD-10-CM | POA: Diagnosis not present

## 2015-12-10 MED ORDER — ESOMEPRAZOLE MAGNESIUM 40 MG PO CPDR: 40.0000 mg | DELAYED_RELEASE_CAPSULE | Freq: Every day | ORAL | 11 refills | Status: DC

## 2015-12-10 NOTE — Patient Instructions (Signed)
We have sent the following medications to your pharmacy for you to pick up at your convenience:  Nexium  Please call back when you are ready to schedule your colonoscopy

## 2015-12-10 NOTE — Progress Notes (Signed)
HISTORY OF PRESENT ILLNESS:  Frank [name] is a 75 y.o. male with a history of GERD, peptic stricture, and ventral hernia. Previous patient of Dr. Verl Blalock. I saw the patient September 2016 in the office. See that dictation for details. He presents today for routine follow-up. He continues on Nexium for GERD. No breakthrough symptoms or recurrent dysphagia. We did discuss long-term PPI use concerns. He still has the ventral hernia which has not caused him any issues. He knowledge is being due for screening colonoscopy this year. Last examination with Dr. Velora Heckler 2007 was negative. GI review of systems otherwise negative  REVIEW OF SYSTEMS:  All non-GI ROS negative except for hearing impairment, sore throat, excessive urination  Past Medical History:  Diagnosis Date  . Diverticulosis   . Esophageal reflux   . Esophageal stricture   . HA (headache)   . Hearing loss   . Hiatal hernia   . Hypertriglyceridemia   . Hypoglycemia   . Obesity, unspecified   . PAF (paroxysmal atrial fibrillation) (HCC)    Echo with normal LV function and mild LVH  . Syncope    presented with atrial fib converted with Diltiazem    Past Surgical History:  Procedure Laterality Date  . CORONARY ARTERY BYPASS GRAFT    . NASAL SEPTUM SURGERY    . TONSILLECTOMY    . [place]    Social History Frank [name]  reports that he quit smoking about 28 years ago. He has never used smokeless tobacco. He reports that he does not drink alcohol or use drugs.  family history includes Emphysema in his father.  Allergies  Allergen Reactions  . Codeine     REACTION: unspecified  . Peanut-Containing Drug Products     PT STATES HE DOESN'T BELIEVE HE IS ALLERGIC ANYMORE.       PHYSICAL EXAMINATION: Vital signs: BP 118/76   Pulse 74   Ht 5' 7.5" (1.715 m)   Wt 205 lb 6 oz (93.2 kg)   BMI 31.69 kg/m   Constitutional: generally well-appearing, no acute distress Psychiatric: alert and  oriented x3, cooperative Eyes: extraocular movements intact, anicteric, conjunctiva pink Mouth: oral pharynx moist, no lesions Neck: supple no lymphadenopathy Cardiovascular: heart regular rate and rhythm, no murmur Lungs: clear to auscultation bilaterally Abdomen: soft, nontender, nondistended, no obvious ascites, no peritoneal signs, normal bowel sounds, no organomegaly. Ventral hernia which spontaneously reduces Rectal:Omitted Extremities: no clubbing cyanosis or lower extremity edema bilaterally Skin: no lesions on visible extremities Neuro: No focal deficits. Cranial nerves intact  ASSESSMENT:  #1. GERD with history of peptic stricture. Currently asymptomatic post dilation on PPI #2. Colon cancer screening. Last colonoscopy 2007. The patient inquired about multiple colon cancer screening strategies. We discussed FIT, Cologard, and colonoscopy. Multiple questions answered #3. Ventral hernia. Stable  PLAN:  #1. Reflux precautions #2. Continue PPI. Nexium refilled #3. Continue to monitor ventral hernia. No indication for surgical opinion #4. Screening colonoscopy. Patient's preference. He will contact the office, his preference, at his convenience to set up his examination.The nature of the procedure, as well as the risks, benefits, and alternatives were carefully and thoroughly reviewed with the patient. Ample time for discussion and questions allowed. The patient understood, was satisfied, and agreed to proceed. #5. Routine office follow-up one year

## 2016-01-06 ENCOUNTER — Encounter: Payer: Self-pay | Admitting: Internal Medicine

## 2016-01-06 ENCOUNTER — Other Ambulatory Visit (INDEPENDENT_AMBULATORY_CARE_PROVIDER_SITE_OTHER): Payer: Medicare Other

## 2016-01-06 ENCOUNTER — Ambulatory Visit (INDEPENDENT_AMBULATORY_CARE_PROVIDER_SITE_OTHER): Payer: Medicare Other | Admitting: Internal Medicine

## 2016-01-06 VITALS — BP 136/76 | HR 48 | Temp 97.6°F | Resp 16 | Ht 67.5 in | Wt 205.8 lb

## 2016-01-06 DIAGNOSIS — E781 Pure hyperglyceridemia: Secondary | ICD-10-CM

## 2016-01-06 DIAGNOSIS — E876 Hypokalemia: Secondary | ICD-10-CM

## 2016-01-06 DIAGNOSIS — Z0001 Encounter for general adult medical examination with abnormal findings: Secondary | ICD-10-CM | POA: Insufficient documentation

## 2016-01-06 DIAGNOSIS — N3281 Overactive bladder: Secondary | ICD-10-CM

## 2016-01-06 DIAGNOSIS — E785 Hyperlipidemia, unspecified: Secondary | ICD-10-CM | POA: Diagnosis not present

## 2016-01-06 DIAGNOSIS — Z0000 Encounter for general adult medical examination without abnormal findings: Secondary | ICD-10-CM | POA: Diagnosis not present

## 2016-01-06 DIAGNOSIS — N400 Enlarged prostate without lower urinary tract symptoms: Secondary | ICD-10-CM

## 2016-01-06 DIAGNOSIS — Z23 Encounter for immunization: Secondary | ICD-10-CM

## 2016-01-06 DIAGNOSIS — Z1211 Encounter for screening for malignant neoplasm of colon: Secondary | ICD-10-CM | POA: Insufficient documentation

## 2016-01-06 DIAGNOSIS — E669 Obesity, unspecified: Secondary | ICD-10-CM

## 2016-01-06 HISTORY — DX: Overactive bladder: N32.81

## 2016-01-06 LAB — BASIC METABOLIC PANEL
BUN: 14 mg/dL (ref 6–23)
CHLORIDE: 105 meq/L (ref 96–112)
CO2: 25 meq/L (ref 19–32)
Calcium: 9.2 mg/dL (ref 8.4–10.5)
Creatinine, Ser: 0.77 mg/dL (ref 0.40–1.50)
GFR: 104.99 mL/min (ref >60.00)
Glucose, Bld: 98 mg/dL (ref 70–99)
POTASSIUM: 4.1 meq/L (ref 3.5–5.1)
SODIUM: 139 meq/L (ref 135–145)

## 2016-01-06 LAB — LIPID PANEL
Cholesterol: 187 mg/dL (ref 0–200)
HDL: 33.1 mg/dL — ABNORMAL LOW (ref >39.00)
LDL Cholesterol: 114 mg/dL — ABNORMAL HIGH (ref 0–99)
NonHDL: 153.63
TRIGLYCERIDES: 196 mg/dL — AB (ref 0.0–149.0)
Total CHOL/HDL Ratio: 6
VLDL: 39.2 mg/dL (ref 0.0–40.0)

## 2016-01-06 LAB — MAGNESIUM: Magnesium: 2 mg/dL (ref 1.5–2.5)

## 2016-01-06 LAB — PSA: PSA: 0.15 ng/mL (ref 0.10–4.00)

## 2016-01-06 MED ORDER — SOLIFENACIN SUCCINATE 10 MG PO TABS: 10.0000 mg | ORAL_TABLET | Freq: Every day | ORAL | 3 refills | Status: DC

## 2016-01-06 NOTE — Progress Notes (Signed)
Subjective:  Patient ID: Frank [name], male    DOB: 1940-11-09  Age: 75 y.o. MRN: [id number]  CC: Hypertension; Hyperlipidemia; and Annual Exam   HPI LAZARIUS RIVKIN presents for AWV/CPX.  He complains of frequent urination. He gets up about 1-2 times per night. He was previously treated for BPH with Rapaflo but says it's not helped much. He does not complain of obstructive urinary symptoms.  He is tolerating his cholesterol medicine well with no muscle or joint aches.  Outpatient Medications Prior to Visit  Medication Sig Dispense Refill  . aspirin 81 MG tablet Take 81 mg by mouth daily.     Marland Kitchen esomeprazole (NEXIUM) 40 MG capsule Take 1 capsule (40 mg total) by mouth daily before breakfast. 30 capsule 11  . naproxen sodium (ANAPROX) 550 MG tablet Take 550 mg by mouth as needed for moderate pain.     Marland Kitchen omega-3 acid ethyl esters (LOVAZA) 1 g capsule Take 2 capsules (2 g total) by mouth 2 (two) times daily. 120 capsule 11  . atorvastatin (LIPITOR) 20 MG tablet Take 1 tablet (20 mg total) by mouth daily. (Patient not taking: Reported on 01/06/2016) 90 tablet 3  . Multiple Vitamin (MULTIVITAMIN) tablet Take 1 tablet by mouth daily.     . sildenafil (VIAGRA) 100 MG tablet Take 1 tablet (100 mg total) by mouth as needed for erectile dysfunction. 6 tablet 11  . silodosin (RAPAFLO) 8 MG CAPS capsule Take 1 capsule (8 mg total) by mouth daily with breakfast. 30 capsule 11   No facility-administered medications prior to visit.     ROS Review of Systems  Constitutional: Negative for activity change, appetite change, chills, fatigue and fever.  HENT: Negative.  Negative for trouble swallowing and voice change.   Eyes: Negative for photophobia and visual disturbance.  Respiratory: Negative.  Negative for cough, choking, chest tightness, shortness of breath, wheezing and stridor.   Cardiovascular: Negative.  Negative for chest pain, palpitations and leg swelling.  Gastrointestinal: Negative.   Negative for abdominal pain, constipation, diarrhea, nausea and vomiting.  Endocrine: Negative.  Negative for polydipsia, polyphagia and polyuria.  Genitourinary: Positive for frequency. Negative for dysuria, enuresis, flank pain, hematuria, penile swelling, scrotal swelling and testicular pain.  Musculoskeletal: Negative.  Negative for back pain, joint swelling and myalgias.  Skin: Negative.  Negative for color change, pallor and rash.  Allergic/Immunologic: Negative.   Neurological: Negative.   Hematological: Negative.  Negative for adenopathy. Does not bruise/bleed easily.  Psychiatric/Behavioral: Negative.     Objective:  BP 136/76 (BP Location: Left Arm, Patient Position: Sitting, Cuff Size: Normal)   Pulse (!) 48   Temp 97.6 F (36.4 C) (Oral)   Resp 16   Ht 5' 7.5" (1.715 m)   Wt 205 lb 12 oz (93.3 kg)   SpO2 98%   BMI 31.75 kg/m   BP Readings from Last 3 Encounters:  01/06/16 136/76  12/10/15 118/76  06/04/15 100/60    Wt Readings from Last 3 Encounters:  01/06/16 205 lb 12 oz (93.3 kg)  12/10/15 205 lb 6 oz (93.2 kg)  06/04/15 198 lb (89.8 kg)    Physical Exam  Constitutional: He is oriented to person, place, and time. He appears well-developed and well-nourished. No distress.  HENT:  Mouth/Throat: Oropharynx is clear and moist. No oropharyngeal exudate.  Eyes: Conjunctivae are normal. Right eye exhibits no discharge. Left eye exhibits no discharge. No scleral icterus.  Neck: Normal range of motion. Neck supple. No JVD present.  No tracheal deviation present. No thyromegaly present.  Cardiovascular: Normal rate, regular rhythm, normal heart sounds and intact distal pulses.  Exam reveals no gallop and no friction rub.   No murmur heard. Pulmonary/Chest: Effort normal and breath sounds normal. No stridor. No respiratory distress. He has no wheezes. He has no rales. He exhibits no tenderness.  Abdominal: Soft. Bowel sounds are normal. He exhibits no distension and no  mass. There is no tenderness. There is no rebound and no guarding. Hernia confirmed negative in the right inguinal area and confirmed negative in the left inguinal area.  Genitourinary: Rectum normal, testes normal and penis normal. Rectal exam shows no external hemorrhoid, no internal hemorrhoid, no fissure, no mass, no tenderness, anal tone normal and guaiac negative stool. Prostate is enlarged (1+ smooth symm BPH). Prostate is not tender. Right testis shows no mass, no swelling and no tenderness. Right testis is descended. Left testis shows no mass, no swelling and no tenderness. Left testis is descended. Circumcised. No penile erythema or penile tenderness. No discharge found.  Musculoskeletal: Normal range of motion. He exhibits no edema, tenderness or deformity.  Lymphadenopathy:    He has no cervical adenopathy.       Right: No inguinal adenopathy present.       Left: No inguinal adenopathy present.  Neurological: He is oriented to person, place, and time.  Skin: Skin is warm and dry. No rash noted. He is not diaphoretic. No erythema. No pallor.  Psychiatric: He has a normal mood and affect. His behavior is normal. Judgment and thought content normal.  Vitals reviewed.   Lab Results  Component Value Date   WBC 6.4 06/04/2015   HGB 13.5 06/04/2015   HCT 39.7 06/04/2015   PLT 223.0 06/04/2015   GLUCOSE 98 01/06/2016   CHOL 187 01/06/2016   TRIG 196.0 (H) 01/06/2016   HDL 33.10 (L) 01/06/2016   LDLDIRECT 91.0 04/23/2015   LDLCALC 114 (H) 01/06/2016   ALT 20 12/25/2014   AST 17 12/25/2014   NA 139 01/06/2016   K 4.1 01/06/2016   CL 105 01/06/2016   CREATININE 0.77 01/06/2016   BUN 14 01/06/2016   CO2 25 01/06/2016   TSH 1.87 12/25/2014   PSA 0.15 01/06/2016   INR 1.00 04/15/2011   HGBA1C 5.5 11/28/2012    Dg Chest 2 View  Result Date: 04/15/2011 *RADIOLOGY REPORT* Clinical Data: Syncopal episode. CHEST - 2 VIEW Comparison: Portable chest 09/13/2005. Findings: The heart is  mildly enlarged.  There is no edema or effusion to suggest failure.  Mild degenerative changes are noted in the thoracic spine.  The lungs are clear.  Mild interstitial coarsening is likely chronic. IMPRESSION: 1.  Borderline cardiomegaly without failure. 2.  No acute cardiopulmonary disease. Original Report Authenticated By: Resa Miner. MATTERN, M.D.   Assessment & Plan:   Redmond was seen today for hypertension, hyperlipidemia and annual exam.  Diagnoses and all orders for this visit:  Benign prostatic hyperplasia without lower urinary tract symptoms- His PSA remains very low so I'm not concerned about prostate cancer, he has no symptoms that need to be treated. -     PSA; Future  Obesity (BMI 30.0-34.9)- he is working on his lifestyle modifications to lose weight.  Hyperlipidemia with target LDL less than 70- he is achieved his LDL goal is doing well on the statin. -     Lipid panel; Future  HYPERTRIGLYCERIDEMIA- improvement noted with omega-3 acid ethyl esters and lifestyle modifications -  Lipid panel; Future  Hypokalemia- his potassium level is normal now -     Basic metabolic panel; Future -     Magnesium; Future  Need for prophylactic vaccination and inoculation against influenza -     Flu vaccine HIGH DOSE PF (Fluzone High dose)  OAB (overactive bladder)- will treat with Vesicare -     solifenacin (VESICARE) 10 MG tablet; Take 1 tablet (10 mg total) by mouth daily.  Screening for colon cancer -     Ambulatory referral to Gastroenterology   I have discontinued Mr. Wandler multivitamin, silodosin, and sildenafil. I am also having him start on solifenacin. Additionally, I am having him maintain his naproxen sodium, aspirin, atorvastatin, omega-3 acid ethyl esters, esomeprazole, and Misc Natural Products (PROSTATE SUPPORT PO).  Meds ordered this encounter  Medications  . Misc Natural Products (PROSTATE SUPPORT PO)    Sig: Take 4 capsules by mouth.  . solifenacin  (VESICARE) 10 MG tablet    Sig: Take 1 tablet (10 mg total) by mouth daily.    Dispense:  90 tablet    Refill:  3   See AVS for instructions about healthy living and anticipatory guidance.  Follow-up: Return in about 3 months (around 04/07/2016).  Scarlette Calico, MD

## 2016-01-06 NOTE — Patient Instructions (Signed)

## 2016-01-06 NOTE — Progress Notes (Signed)
Pre visit review using our clinic review tool, if applicable. No additional management support is needed unless otherwise documented below in the visit note. 

## 2016-01-07 NOTE — Assessment & Plan Note (Signed)

## 2016-02-06 ENCOUNTER — Other Ambulatory Visit: Payer: Self-pay

## 2016-03-11 ENCOUNTER — Encounter: Payer: Self-pay | Admitting: Internal Medicine

## 2016-03-22 ENCOUNTER — Encounter: Payer: Self-pay | Admitting: Cardiovascular Disease

## 2016-03-22 ENCOUNTER — Ambulatory Visit (INDEPENDENT_AMBULATORY_CARE_PROVIDER_SITE_OTHER): Payer: Medicare Other | Admitting: Cardiovascular Disease

## 2016-03-22 VITALS — BP 100/66 | HR 53 | Ht 67.5 in | Wt 207.8 lb

## 2016-03-22 DIAGNOSIS — E782 Mixed hyperlipidemia: Secondary | ICD-10-CM

## 2016-03-22 DIAGNOSIS — R001 Bradycardia, unspecified: Secondary | ICD-10-CM

## 2016-03-22 DIAGNOSIS — I480 Paroxysmal atrial fibrillation: Secondary | ICD-10-CM | POA: Diagnosis not present
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
## 2016-03-22 NOTE — Patient Instructions (Addendum)
Medication Instructions:    Your physician recommends that you continue on your current medications as directed. Please refer to the Current Medication list given to you today.  --- If you need a refill on your cardiac medications before your next appointment, please call your pharmacy. ---  Labwork:  None ordered  Testing/Procedures:  None ordered  Follow-Up:  Your physician wants you to follow-up in: 1 year with Dr. Acie Fredrickson.  You will receive a reminder letter in the mail two months in advance. If you don't receive a letter, please call our office to schedule the follow-up appointment.   Any Other Special Instructions Will Be Listed Below (If Applicable).     Thank you for choosing [facility]!!

## 2016-04-07 ENCOUNTER — Encounter: Payer: Self-pay | Admitting: Internal Medicine

## 2016-04-07 ENCOUNTER — Ambulatory Visit (INDEPENDENT_AMBULATORY_CARE_PROVIDER_SITE_OTHER): Payer: Medicare Other | Admitting: Internal Medicine

## 2016-04-07 ENCOUNTER — Ambulatory Visit: Payer: Medicare Other | Admitting: Internal Medicine

## 2016-04-07 VITALS — BP 140/70 | HR 54 | Temp 97.6°F | Ht 67.5 in | Wt 206.5 lb

## 2016-04-07 DIAGNOSIS — I480 Paroxysmal atrial fibrillation: Secondary | ICD-10-CM | POA: Diagnosis not present

## 2016-04-07 DIAGNOSIS — Z23 Encounter for immunization: Secondary | ICD-10-CM | POA: Diagnosis not present

## 2016-04-07 DIAGNOSIS — Z87892 Personal history of anaphylaxis: Secondary | ICD-10-CM | POA: Diagnosis not present

## 2016-04-07 DIAGNOSIS — N3281 Overactive bladder: Secondary | ICD-10-CM | POA: Diagnosis not present

## 2016-04-07 MED ORDER — EPINEPHRINE 0.3 MG/0.3ML IJ SOAJ: 0.3000 mg | Freq: Once | INTRAMUSCULAR | 3 refills | Status: AC

## 2016-04-07 NOTE — Progress Notes (Signed)
Subjective:  Patient ID: Frank [name], male    DOB: 16-Dec-1940  Age: 76 y.o. MRN: [id number]  CC: Over Active Bladder   HPI Frank [name] presents for follow-up after starting Vesicare for overactive bladder. He feels much better and offers no urinary complaints today. 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  Outpatient Medications Prior to Visit  Medication Sig Dispense Refill  . aspirin 81 MG tablet Take 81 mg by mouth daily.     Marland Kitchen esomeprazole (NEXIUM) 40 MG capsule Take 1 capsule (40 mg total) by mouth daily before breakfast. 30 capsule 11  . naproxen sodium (ANAPROX) 550 MG tablet Take 550 mg by mouth as needed for moderate pain.     Marland Kitchen omega-3 acid ethyl esters (LOVAZA) 1 g capsule Take 2 capsules (2 g total) by mouth 2 (two) times daily. 120 capsule 11  . solifenacin (VESICARE) 10 MG tablet Take 1 tablet (10 mg total) by mouth daily. 90 tablet 3  . Misc Natural Products (PROSTATE SUPPORT PO) Take 4 capsules by mouth.     No facility-administered medications prior to visit.     ROS Review of Systems  Constitutional: Negative.  Negative for fatigue.  HENT: Negative.   Eyes: Negative for visual disturbance.  Respiratory: Negative for chest tightness and shortness of breath.   Cardiovascular: Negative for chest pain, palpitations and leg swelling.  Gastrointestinal: Negative for abdominal pain, constipation, diarrhea, nausea and vomiting.  Endocrine: Negative.   Genitourinary: Negative.  Negative for decreased urine volume, difficulty urinating, dysuria, frequency, hematuria, testicular pain and urgency.  Musculoskeletal: Negative for back pain and neck pain.  Skin: Negative.   Allergic/Immunologic: Negative.   Neurological: Negative.   Hematological: Negative.  Negative for adenopathy. Does not bruise/bleed easily.  Psychiatric/Behavioral: Negative.     Objective:  BP 140/70 (BP Location: Left Arm, Patient  Position: Sitting, Cuff Size: Normal)   Pulse (!) 54   Temp 97.6 F (36.4 C) (Oral)   Ht 5' 7.5" (1.715 m)   Wt 206 lb 8 oz (93.7 kg)   SpO2 98%   BMI 31.87 kg/m   BP Readings from Last 3 Encounters:  04/07/16 140/70  03/22/16 100/66  01/06/16 136/76    Wt Readings from Last 3 Encounters:  04/07/16 206 lb 8 oz (93.7 kg)  03/22/16 207 lb 12.8 oz (94.3 kg)  01/06/16 205 lb 12 oz (93.3 kg)    Physical Exam  Constitutional: He is oriented to person, place, and time. No distress.  HENT:  Mouth/Throat: Oropharynx is clear and moist. No oropharyngeal exudate.  Eyes: Conjunctivae are normal. Right eye exhibits no discharge. No scleral icterus.  Neck: Normal range of motion. Neck supple. No JVD present. No tracheal deviation present. No thyromegaly present.  Cardiovascular: Normal rate, regular rhythm, normal heart sounds and intact distal pulses.  Exam reveals no gallop and no friction rub.   No murmur heard. Pulmonary/Chest: Effort normal and breath sounds normal. No respiratory distress. He has no wheezes. He has no rales. He exhibits no tenderness.  Abdominal: Soft. Bowel sounds are normal. He exhibits no distension and no mass. There is no tenderness. There is no rebound and no guarding.  Musculoskeletal: Normal range of motion. He exhibits no edema, tenderness or deformity.  Lymphadenopathy:    He has no cervical adenopathy.  Neurological: He is oriented to person, place, and time.  Skin: Skin is warm and  dry. No rash noted. He is not diaphoretic. No erythema. No pallor.  Vitals reviewed.   Lab Results  Component Value Date   WBC 6.4 06/04/2015   HGB 13.5 06/04/2015   HCT 39.7 06/04/2015   PLT 223.0 06/04/2015   GLUCOSE 98 01/06/2016   CHOL 187 01/06/2016   TRIG 196.0 (H) 01/06/2016   HDL 33.10 (L) 01/06/2016   LDLDIRECT 91.0 04/23/2015   LDLCALC 114 (H) 01/06/2016   ALT 20 12/25/2014   AST 17 12/25/2014   NA 139 01/06/2016   K 4.1 01/06/2016   CL 105 01/06/2016     CREATININE 0.77 01/06/2016   BUN 14 01/06/2016   CO2 25 01/06/2016   TSH 1.87 12/25/2014   PSA 0.15 01/06/2016   INR 1.00 04/15/2011   HGBA1C 5.5 11/28/2012    Dg Chest 2 View  Result Date: 04/15/2011 *RADIOLOGY REPORT* Clinical Data: Syncopal episode. CHEST - 2 VIEW Comparison: Portable chest 09/13/2005. Findings: The heart is mildly enlarged.  There is no edema or effusion to suggest failure.  Mild degenerative changes are noted in the thoracic spine.  The lungs are clear.  Mild interstitial coarsening is likely chronic. IMPRESSION: 1.  Borderline cardiomegaly without failure. 2.  No acute cardiopulmonary disease. Original Report Authenticated By: Resa Miner. MATTERN, M.D.   Assessment & Plan:   Frank [name] was seen today for over active bladder.  Diagnoses and all orders for this visit:  History of anaphylaxis -     EPINEPHrine 0.3 mg/0.3 mL IJ SOAJ injection; Inject 0.3 mLs (0.3 mg total) into the muscle once.  PAF (paroxysmal atrial fibrillation) ([facility])- he has good rate and rhythm control  OAB (overactive bladder)- significant improvement noted with Vesicare, will continue  Need for vaccination with combined diphtheria-tetanus-pertussis (DTaP) -     Tdap vaccine greater than or equal to 7yo IM   I have discontinued Mr. Bowdish Misc Natural Products (PROSTATE SUPPORT PO). I am also having him start on EPINEPHrine. Additionally, I am having him maintain his naproxen sodium, aspirin, omega-3 acid ethyl esters, esomeprazole, and solifenacin.  Meds ordered this encounter  Medications  . EPINEPHrine 0.3 mg/0.3 mL IJ SOAJ injection    Sig: Inject 0.3 mLs (0.3 mg total) into the muscle once.    Dispense:  1 Device    Refill:  3     Follow-up: Return if symptoms worsen or fail to improve.  Frank Calico, MD

## 2016-04-07 NOTE — Patient Instructions (Signed)
Overactive Bladder, Adult Introduction Overactive bladder is a group of urinary symptoms. With overactive bladder, you may suddenly feel the need to pass urine (urinate) right away. After feeling this sudden urge, you might also leak urine if you cannot get to the bathroom fast enough (urinary incontinence). These symptoms might interfere with your daily work or social activities. Overactive bladder symptoms may also wake you up at night. Overactive bladder affects the nerve signals between your bladder and your brain. Your bladder may get the signal to empty before it is full. Very sensitive muscles can also make your bladder squeeze too soon. What are the causes? Many things can cause an overactive bladder. Possible causes include:  Urinary tract infection.  Infection of nearby tissues, such as the prostate.  Prostate enlargement.  Being pregnant with twins or more (multiples).  Surgery on the uterus or urethra.  Bladder stones, inflammation, or tumors.  Drinking too much caffeine or alcohol.  Certain medicines, especially those that you take to help your body get rid of extra fluid (diuretics) by increasing urine production.  Muscle or nerve weakness, especially from:  A spinal cord injury.  Stroke.  Multiple sclerosis.  Parkinson disease.  Diabetes. This can cause a high urine volume that fills the bladder so quickly that the normal urge to urinate is triggered very strongly.  Constipation. A buildup of too much stool can put pressure on your bladder. What increases the risk? You may be at greater risk for overactive bladder if you:  Are an older adult.  Smoke.  Are going through menopause.  Have prostate problems.  Have a neurological disease, such as stroke, dementia, Parkinson disease, or multiple sclerosis (MS).  Eat or drink things that irritate the bladder. These include alcohol, spicy food, and caffeine.  Are overweight or obese. What are the signs or  symptoms? The signs and symptoms of an overactive bladder include:  Sudden, strong urges to urinate.  Leaking urine.  Urinating eight or more times per day.  Waking up to urinate two or more times per night. How is this diagnosed? Your health care provider may suspect overactive bladder based on your symptoms. The health care provider will do a physical exam and take your medical history. Blood or urine tests may also be done. For example, you might need to have a bladder function test to check how well you can hold your urine. You might also need to see a health care provider who specializes in the urinary tract (urologist). How is this treated? Treatment for overactive bladder depends on the cause of your condition and whether it is mild or severe. Certain treatments can be done in your health care provider's office or clinic. You can also make lifestyle changes at home. Options include: Behavioral Treatments  Biofeedback. A specialist uses sensors to help you become aware of your body's signals.  Keeping a daily log of when you need to urinate and what happens after the urge. This may help you manage your condition.  Bladder training. This helps you learn to control the urge to urinate by following a schedule that directs you to urinate at regular intervals (timed voiding). At first, you might have to wait a few minutes after feeling the urge. In time, you should be able to schedule bathroom visits an hour or more apart.  Kegel exercises. These are exercises to strengthen the pelvic floor muscles, which support the bladder. Toning these muscles can help you control urination, even if your bladder muscles   are overactive. A specialist will teach you how to do these exercises correctly. They require daily practice.  Weight loss. If you are obese or overweight, losing weight might relieve your symptoms of overactive bladder. Talk to your health care provider about losing weight and whether  there is a specific program or method that would work best for you.  Diet change. This might help if constipation is making your overactive bladder worse. Your health care provider or a dietitian can explain ways to change what you eat to ease constipation. You might also need to consume less alcohol and caffeine or drink other fluids at different times of the day.  Stopping smoking.  Wearing pads to absorb leakage while you wait for other treatments to take effect. Physical Treatments  Electrical stimulation. Electrodes send gentle pulses of electricity to strengthen the nerves or muscles that help to control the bladder. Sometimes, the electrodes are placed outside of the body. In other cases, they might be placed inside the body (implanted). This treatment can take several months to have an effect.  Supportive devices. Women may need a plastic device that fits into the vagina and supports the bladder (pessary). Medicines  Several medicines can help treat overactive bladder and are usually used along with other treatments. Some are injected into the muscles involved in urination. Others come in pill form. Your health care provider may prescribe:  Antispasmodics. These medicines block the signals that the nerves send to the bladder. This keeps the bladder from releasing urine at the wrong time.  Tricyclic antidepressants. These types of antidepressants also relax bladder muscles. Surgery  You may have a device implanted to help manage the nerve signals that indicate when you need to urinate.  You may have surgery to implant electrodes for electrical stimulation.  Sometimes, very severe cases of overactive bladder require surgery to change the shape of the bladder. Follow these instructions at home:  Take medicines only as directed by your health care provider.  Use any implants or a pessary as directed by your health care provider.  Make any diet or lifestyle changes that are  recommended by your health care provider. These might include:  Drinking less fluid or drinking at different times of the day. If you need to urinate often during the night, you may need to stop drinking fluids early in the evening.  Cutting down on caffeine or alcohol. Both can make an overactive bladder worse. Caffeine is found in coffee, tea, and sodas.  Doing Kegel exercises to strengthen muscles.  Losing weight if you need to.  Eating a healthy and balanced diet to prevent constipation.  Keep a journal or log to track how much and when you drink and also when you feel the need to urinate. This will help your health care provider to monitor your condition. Contact a health care provider if:  Your symptoms do not get better after treatment.  Your pain and discomfort are getting worse.  You have more frequent urges to urinate.  You have a fever. Get help right away if: You are not able to control your bladder at all. This information is not intended to replace advice given to you by your health care provider. Make sure you discuss any questions you have with your health care provider. Document Released: 12/12/2008 Document Revised: 07/24/2015 Document Reviewed: 07/11/2013  2017 Elsevier  

## 2016-04-07 NOTE — Progress Notes (Signed)
Pre visit review using our clinic review tool, if applicable. No additional management support is needed unless otherwise documented below in the visit note. 

## 2016-05-25 ENCOUNTER — Encounter: Payer: Self-pay | Admitting: Internal Medicine

## 2016-07-07 ENCOUNTER — Other Ambulatory Visit: Payer: Self-pay | Admitting: Internal Medicine

## 2016-07-07 DIAGNOSIS — E781 Pure hyperglyceridemia: Secondary | ICD-10-CM

## 2016-07-27 ENCOUNTER — Ambulatory Visit (AMBULATORY_SURGERY_CENTER): Payer: Self-pay | Admitting: *Deleted

## 2016-07-27 VITALS — Ht 67.5 in | Wt 204.8 lb

## 2016-07-27 DIAGNOSIS — Z1211 Encounter for screening for malignant neoplasm of colon: Secondary | ICD-10-CM

## 2016-07-27 MED ORDER — NA SULFATE-K SULFATE-MG SULF 17.5-3.13-1.6 GM/177ML PO SOLN: ORAL | 0 refills | Status: DC

## 2016-07-27 NOTE — Progress Notes (Deleted)
Pt denies allergies to eggs or soy products. Denies difficulty with sedation or anesthesia. Denies any diet or weight loss medications. Denies use of supplemental oxygen.  Emmi instructions given for procedure.

## 2016-07-27 NOTE — Progress Notes (Signed)
Pt denies allergies to eggs or soy products. Denies difficulty with sedation or anesthesia. Denies any diet or weight loss medications. Denies use of supplemental oxygen.  Patient was insistent that he didn't need the video.

## 2016-08-09 ENCOUNTER — Encounter: Payer: Self-pay | Admitting: Internal Medicine

## 2016-08-09 ENCOUNTER — Ambulatory Visit (AMBULATORY_SURGERY_CENTER): Payer: Medicare Other | Admitting: Internal Medicine

## 2016-08-09 VITALS — BP 105/63 | HR 50 | Temp 98.0°F | Resp 16 | Ht 67.0 in | Wt 204.0 lb

## 2016-08-09 DIAGNOSIS — D122 Benign neoplasm of ascending colon: Secondary | ICD-10-CM

## 2016-08-09 DIAGNOSIS — Z1212 Encounter for screening for malignant neoplasm of rectum: Secondary | ICD-10-CM | POA: Diagnosis not present

## 2016-08-09 DIAGNOSIS — Z1211 Encounter for screening for malignant neoplasm of colon: Secondary | ICD-10-CM | POA: Diagnosis not present

## 2016-08-09 DIAGNOSIS — K449 Diaphragmatic hernia without obstruction or gangrene: Secondary | ICD-10-CM | POA: Diagnosis not present

## 2016-08-09 DIAGNOSIS — K219 Gastro-esophageal reflux disease without esophagitis: Secondary | ICD-10-CM | POA: Diagnosis not present

## 2016-08-09 LAB — HM COLONOSCOPY

## 2016-08-09 MED ORDER — SODIUM CHLORIDE 0.9 % IV SOLN: 500.0000 mL | INTRAVENOUS | Status: DC

## 2016-08-09 NOTE — Progress Notes (Signed)
Report to PACU, RN, vss, BBS= Clear.  

## 2016-08-09 NOTE — Progress Notes (Signed)
No problems noted in the recovery room. maw 

## 2016-08-09 NOTE — Progress Notes (Signed)
Called to room to assist during endoscopic procedure.  Patient ID and intended procedure confirmed with present staff. Received instructions for my participation in the procedure from the performing physician.  

## 2016-08-09 NOTE — Op Note (Signed)
[place] Patient Name: Frank [name] Procedure Date: 08/09/2016 9:34 AM MRN: [id number] Endoscopist: Docia Chuck. Henrene Pastor , MD Age: 76 Referring MD:  Date of Birth: 1941/01/31 Gender: Male Account #: [id number] Procedure:                Colonoscopy, with cold snare polypectomy x 1 Indications:              Screening for colorectal malignant neoplasm.                            Negative index exam (Dr. Sharlett Iles) 2007 Medicines:                Monitored Anesthesia Care Procedure:                Pre-Anesthesia Assessment:                           - Prior to the procedure, a History and Physical                            was performed, and patient medications and                            allergies were reviewed. The patient's tolerance of                            previous anesthesia was also reviewed. The risks                            and benefits of the procedure and the sedation                            options and risks were discussed with the patient.                            All questions were answered, and informed consent                            was obtained. Prior Anticoagulants: The patient has                            taken no previous anticoagulant or antiplatelet                            agents. ASA Grade Assessment: II - A patient with                            mild systemic disease. After reviewing the risks                            and benefits, the patient was deemed in                            satisfactory condition to undergo the procedure.  After obtaining informed consent, the colonoscope                            was passed under direct vision. Throughout the                            procedure, the patient's blood pressure, pulse, and                            oxygen saturations were monitored continuously. The                            Colonoscope was introduced through the anus and      advanced to the the cecum, identified by                            appendiceal orifice and ileocecal valve. The                            ileocecal valve, appendiceal orifice, and rectum                            were photographed. The quality of the bowel                            preparation was excellent. The colonoscopy was                            performed without difficulty. The patient tolerated                            the procedure well. The bowel preparation used was                            SUPREP. Scope In: 9:43:17 AM Scope Out: 9:55:42 AM Scope Withdrawal Time: 0 hours 10 minutes 4 seconds  Total Procedure Duration: 0 hours 12 minutes 25 seconds  Findings:                 A 4 mm polyp was found in the distal ascending                            colon. The polyp was removed with a cold snare.                            Resection and retrieval were complete.                           Multiple diverticula were found in the left colon                            and right colon.                           Internal hemorrhoids were found during  retroflexion.                           The exam was otherwise without abnormality on                            direct and retroflexion views. Complications:            No immediate complications. Estimated blood loss:                            None. Estimated Blood Loss:     Estimated blood loss: none. Impression:               - One 4 mm polyp in the distal ascending colon,                            removed with a cold snare. Resected and retrieved.                           - Diverticulosis in the left colon and in the right                            colon.                           - Internal hemorrhoids.                           - The examination was otherwise normal on direct                            and retroflexion views. Recommendation:           - Repeat colonoscopy in 5 years if polyp                             adenomatous and patient medically fit/willing.                           - Patient has a contact number available for                            emergencies. The signs and symptoms of potential                            delayed complications were discussed with the                            patient. Return to normal activities tomorrow.                            Written discharge instructions were provided to the                            patient.                           -  Resume previous diet.                           - Continue present medications.                           - Await pathology results. Docia Chuck. Henrene Pastor, MD 08/09/2016 10:00:27 AM This report has been signed electronically.

## 2016-08-09 NOTE — Patient Instructions (Signed)
YOU HAD AN ENDOSCOPIC PROCEDURE TODAY AT [facility]:   Refer to the procedure report that was given to you for any specific questions about what was found during the examination.  If the procedure report does not answer your questions, please call your gastroenterologist to clarify.  If you requested that your care partner not be given the details of your procedure findings, then the procedure report has been included in a sealed envelope for you to review at your convenience later.  YOU SHOULD EXPECT: Some feelings of bloating in the abdomen. Passage of more gas than usual.  Walking can help get rid of the air that was put into your GI tract during the procedure and reduce the bloating. If you had a lower endoscopy (such as a colonoscopy or flexible sigmoidoscopy) you may notice spotting of blood in your stool or on the toilet paper. If you underwent a bowel prep for your procedure, you may not have a normal bowel movement for a few days.  Please Note:  You might notice some irritation and congestion in your nose or some drainage.  This is from the oxygen used during your procedure.  There is no need for concern and it should clear up in a day or so.  SYMPTOMS TO REPORT IMMEDIATELY:   Following lower endoscopy (colonoscopy or flexible sigmoidoscopy):  Excessive amounts of blood in the stool  Significant tenderness or worsening of abdominal pains  Swelling of the abdomen that is new, acute  Fever of 100F or higher   For urgent or emergent issues, a gastroenterologist can be reached at any hour by calling [phone].   DIET:  We do recommend a small meal at first, but then you may proceed to your regular diet.  Drink plenty of fluids but you should avoid alcoholic beverages for 24 hours.  ACTIVITY:  You should plan to take it easy for the rest of today and you should NOT DRIVE or use heavy machinery until tomorrow (because of the sedation medicines used during the test).     FOLLOW UP: Our staff will call the number listed on your records the next business day following your procedure to check on you and address any questions or concerns that you may have regarding the information given to you following your procedure. If we do not reach you, we will leave a message.  However, if you are feeling well and you are not experiencing any problems, there is no need to return our call.  We will assume that you have returned to your regular daily activities without incident.  If any biopsies were taken you will be contacted by phone or by letter within the next 1-3 weeks.  Please call us at [phone] if you have not heard about the biopsies in 3 weeks.    SIGNATURES/CONFIDENTIALITY: You and/or your care partner have signed paperwork which will be entered into your electronic medical record.  These signatures attest to the fact that that the information above on your After Visit Summary has been reviewed and is understood.  Full responsibility of the confidentiality of this discharge information lies with you and/or your care-partner.    Handouts were given to your care partner on polyps, diverticulosis, hemorrhoids, and a high fiber diet with liberal fluid intake. You may resume your current medications today. Await biopsy results. Please call if any questions or concerns.

## 2016-08-09 NOTE — Progress Notes (Signed)
Pt's states no medical or surgical changes since previsit or office visit. 

## 2016-08-10 ENCOUNTER — Telehealth: Payer: Self-pay

## 2016-08-10 NOTE — Telephone Encounter (Signed)
  Follow up Call-  Call back number 08/09/2016  Post procedure Call Back phone  # [phone]  Permission to leave phone message Yes  Some recent data might be hidden     Patient questions:  Do you have a fever, pain , or abdominal swelling? No. Pain Score  0 *  Have you tolerated food without any problems? Yes.    Have you been able to return to your normal activities? Yes.    Do you have any questions about your discharge instructions: Diet   No. Medications  No. Follow up visit  No.  Do you have questions or concerns about your Care? No.  Actions: * If pain score is 4 or above: No action needed, pain <4.

## 2016-08-18 ENCOUNTER — Encounter: Payer: Self-pay | Admitting: Internal Medicine

## 2016-11-02 DIAGNOSIS — R51 Headache: Secondary | ICD-10-CM | POA: Diagnosis not present

## 2016-11-02 DIAGNOSIS — G43019 Migraine without aura, intractable, without status migrainosus: Secondary | ICD-10-CM | POA: Diagnosis not present

## 2016-11-22 DIAGNOSIS — H2513 Age-related nuclear cataract, bilateral: Secondary | ICD-10-CM | POA: Diagnosis not present

## 2016-11-22 DIAGNOSIS — H5203 Hypermetropia, bilateral: Secondary | ICD-10-CM | POA: Diagnosis not present

## 2016-12-08 ENCOUNTER — Other Ambulatory Visit: Payer: Self-pay | Admitting: Internal Medicine

## 2016-12-16 DIAGNOSIS — M25561 Pain in right knee: Secondary | ICD-10-CM | POA: Diagnosis not present

## 2017-01-10 ENCOUNTER — Other Ambulatory Visit (INDEPENDENT_AMBULATORY_CARE_PROVIDER_SITE_OTHER): Payer: Medicare Other

## 2017-01-10 ENCOUNTER — Encounter: Payer: Self-pay | Admitting: Internal Medicine

## 2017-01-10 ENCOUNTER — Ambulatory Visit (INDEPENDENT_AMBULATORY_CARE_PROVIDER_SITE_OTHER): Payer: Medicare Other | Admitting: Internal Medicine

## 2017-01-10 VITALS — BP 108/68 | HR 47 | Temp 97.8°F | Resp 16 | Ht 67.0 in | Wt 200.0 lb

## 2017-01-10 DIAGNOSIS — E781 Pure hyperglyceridemia: Secondary | ICD-10-CM

## 2017-01-10 DIAGNOSIS — N400 Enlarged prostate without lower urinary tract symptoms: Secondary | ICD-10-CM

## 2017-01-10 DIAGNOSIS — E785 Hyperlipidemia, unspecified: Secondary | ICD-10-CM

## 2017-01-10 DIAGNOSIS — I4891 Unspecified atrial fibrillation: Secondary | ICD-10-CM | POA: Diagnosis not present

## 2017-01-10 DIAGNOSIS — I480 Paroxysmal atrial fibrillation: Secondary | ICD-10-CM

## 2017-01-10 DIAGNOSIS — Z23 Encounter for immunization: Secondary | ICD-10-CM | POA: Diagnosis not present

## 2017-01-10 DIAGNOSIS — R001 Bradycardia, unspecified: Secondary | ICD-10-CM

## 2017-01-10 DIAGNOSIS — E669 Obesity, unspecified: Secondary | ICD-10-CM

## 2017-01-10 LAB — CBC WITH DIFFERENTIAL/PLATELET
BASOS ABS: 0 10*3/uL (ref 0.0–0.1)
Basophils Relative: 0.6 % (ref 0.0–3.0)
EOS ABS: 0.1 10*3/uL (ref 0.0–0.7)
Eosinophils Relative: 0.8 % (ref 0.0–5.0)
HCT: 44.6 % (ref 39.0–52.0)
Hemoglobin: 15.1 g/dL (ref 13.0–17.0)
LYMPHS ABS: 1.6 10*3/uL (ref 0.7–4.0)
LYMPHS PCT: 22.9 % (ref 12.0–46.0)
MCHC: 33.9 g/dL (ref 30.0–36.0)
MCV: 97 fl (ref 78.0–100.0)
MONOS PCT: 7.6 % (ref 3.0–12.0)
Monocytes Absolute: 0.5 10*3/uL (ref 0.1–1.0)
NEUTROS ABS: 4.7 10*3/uL (ref 1.4–7.7)
NEUTROS PCT: 68.1 % (ref 43.0–77.0)
PLATELETS: 121 10*3/uL — AB (ref 150.0–400.0)
RBC: 4.59 Mil/uL (ref 4.22–5.81)
RDW: 13.9 % (ref 11.5–15.5)
WBC: 6.9 10*3/uL (ref 4.0–10.5)

## 2017-01-10 LAB — COMPREHENSIVE METABOLIC PANEL
ALK PHOS: 49 U/L (ref 39–117)
ALT: 18 U/L (ref 0–53)
AST: 15 U/L (ref 0–37)
Albumin: 4.3 g/dL (ref 3.5–5.2)
BUN: 12 mg/dL (ref 6–23)
CO2: 26 meq/L (ref 19–32)
CREATININE: 0.81 mg/dL (ref 0.40–1.50)
Calcium: 9.3 mg/dL (ref 8.4–10.5)
Chloride: 104 mEq/L (ref 96–112)
GFR: 98.75 mL/min (ref >60.00)
GLUCOSE: 90 mg/dL (ref 70–99)
Potassium: 4.2 mEq/L (ref 3.5–5.1)
Sodium: 139 mEq/L (ref 135–145)
TOTAL PROTEIN: 6.9 g/dL (ref 6.0–8.3)
Total Bilirubin: 1 mg/dL (ref 0.2–1.2)

## 2017-01-10 LAB — LIPID PANEL
CHOL/HDL RATIO: 6
Cholesterol: 173 mg/dL (ref 0–200)
HDL: 27.4 mg/dL — ABNORMAL LOW (ref >39.00)
LDL Cholesterol: 113 mg/dL — ABNORMAL HIGH (ref 0–99)
NONHDL: 146.07
Triglycerides: 166 mg/dL — ABNORMAL HIGH (ref 0.0–149.0)
VLDL: 33.2 mg/dL (ref 0.0–40.0)

## 2017-01-10 LAB — THYROID PANEL WITH TSH
FREE THYROXINE INDEX: 2.4 (ref 1.4–3.8)
T3 UPTAKE: 30 % (ref 22–35)
T4, Total: 7.9 ug/dL (ref 4.9–10.5)
TSH: 2.52 m[IU]/L (ref 0.40–4.50)

## 2017-01-10 LAB — PSA: PSA: 0.16 ng/mL (ref 0.10–4.00)

## 2017-01-10 MED ORDER — ZOSTER VAC RECOMB ADJUVANTED 50 MCG/0.5ML IM SUSR: 0.5000 mL | Freq: Once | INTRAMUSCULAR | 1 refills | Status: AC

## 2017-01-10 NOTE — Progress Notes (Signed)
Subjective:  Patient ID: Frank [name], male    DOB: [birth year]  Age: 76 y.o. MRN: [id number]  CC: Atrial Fibrillation and Hyperlipidemia   HPI KAESON KLEINERT presents for f/up -he is very active and has had no recent episodes of palpitations, DOE, chest pain, shortness of breath, or fatigue.  He feels well today and offers no complaints.  Outpatient Medications Prior to Visit  Medication Sig Dispense Refill  . aspirin 81 MG tablet Take 81 mg by mouth daily.     Marland Kitchen esomeprazole (NEXIUM) 40 MG capsule TAKE ONE CAPSULE BY MOUTH DAILY BEFORE BREAKFAST 90 capsule 1  . naproxen sodium (ANAPROX) 550 MG tablet Take 550 mg by mouth as needed for moderate pain.     Marland Kitchen omega-3 acid ethyl esters (LOVAZA) 1 g capsule TAKE TWO CAPSULES BY MOUTH TWICE DAILY 120 capsule 5  . solifenacin (VESICARE) 10 MG tablet Take 1 tablet (10 mg total) by mouth daily. 90 tablet 3  . 0.9 %  sodium chloride infusion      No facility-administered medications prior to visit.     ROS Review of Systems  Constitutional: Negative for chills, diaphoresis and fatigue.  HENT: Negative.   Eyes: Negative.  Negative for visual disturbance.  Respiratory: Negative.  Negative for cough, chest tightness and wheezing.   Cardiovascular: Negative.  Negative for chest pain, palpitations and leg swelling.  Gastrointestinal: Negative for abdominal pain, constipation, diarrhea, nausea and vomiting.  Endocrine: Negative.   Genitourinary: Negative.  Negative for decreased urine volume and difficulty urinating.  Musculoskeletal: Negative.  Negative for arthralgias and myalgias.  Allergic/Immunologic: Negative.   Neurological: Negative.  Negative for dizziness, syncope, weakness and light-headedness.  Hematological: Negative for adenopathy. Does not bruise/bleed easily.  Psychiatric/Behavioral: Negative.     Objective:  BP 108/68   Pulse (!) 47   Temp 97.8 F (36.6 C) (Oral)   Resp 16   Ht 5\' 7"  (1.702 m)   Wt 200 lb (90.7 kg)    SpO2 98%   BMI 31.32 kg/m   BP Readings from Last 3 Encounters:  01/10/17 108/68  08/09/16 105/63  04/07/16 140/70    Wt Readings from Last 3 Encounters:  01/10/17 200 lb (90.7 kg)  08/09/16 204 lb (92.5 kg)  07/27/16 204 lb 12.8 oz (92.9 kg)    Physical Exam  Constitutional: He is oriented to person, place, and time. No distress.  HENT:  Mouth/Throat: Oropharynx is clear and moist. No oropharyngeal exudate.  Eyes: Conjunctivae are normal. Right eye exhibits no discharge. Left eye exhibits no discharge. No scleral icterus.  Neck: Normal range of motion. Neck supple. No JVD present. No thyromegaly present.  Cardiovascular: S1 normal and S2 normal. Bradycardia present. Exam reveals no gallop.  No murmur heard. EKG--  Marked sinus  Bradycardia  Low voltage with rightward P-axis and rotation -possible pulmonary disease.   ABNORMAL - no change from the prior EKG  Pulmonary/Chest: Effort normal and breath sounds normal. No respiratory distress. He has no wheezes. He has no rales. He exhibits no tenderness.  Abdominal: Soft. Bowel sounds are normal. He exhibits no distension and no mass. There is no tenderness. There is no rebound and no guarding. Hernia confirmed negative in the right inguinal area and confirmed negative in the left inguinal area.  Genitourinary: Rectum normal, testes normal and penis normal. Rectal exam shows no external hemorrhoid, no internal hemorrhoid, no fissure, no mass, no tenderness, anal tone normal and guaiac negative stool. Prostate is enlarged (  1+ smooth symm BPH). Prostate is not tender. Right testis shows no mass, no swelling and no tenderness. Right testis is descended. Left testis shows no mass, no swelling and no tenderness. Left testis is descended. Circumcised. No penile erythema or penile tenderness. No discharge found.  Musculoskeletal: Normal range of motion. He exhibits no edema, tenderness or deformity.  Lymphadenopathy:    He has no cervical  adenopathy.       Right: No inguinal adenopathy present.       Left: No inguinal adenopathy present.  Neurological: He is alert and oriented to person, place, and time.  Skin: Skin is warm and dry. No rash noted. He is not diaphoretic. No erythema.  Vitals reviewed.   Lab Results  Component Value Date   WBC 6.9 01/10/2017   HGB 15.1 01/10/2017   HCT 44.6 01/10/2017   PLT 121.0 (L) 01/10/2017   GLUCOSE 90 01/10/2017   CHOL 173 01/10/2017   TRIG 166.0 (H) 01/10/2017   HDL 27.40 (L) 01/10/2017   LDLDIRECT 91.0 04/23/2015   LDLCALC 113 (H) 01/10/2017   ALT 18 01/10/2017   AST 15 01/10/2017   NA 139 01/10/2017   K 4.2 01/10/2017   CL 104 01/10/2017   CREATININE 0.81 01/10/2017   BUN 12 01/10/2017   CO2 26 01/10/2017   TSH 2.52 01/10/2017   PSA 0.16 01/10/2017   INR 1.00 04/15/2011   HGBA1C 5.5 11/28/2012    Dg Chest 2 View  Result Date: 04/15/2011 *RADIOLOGY REPORT* Clinical Data: Syncopal episode. CHEST - 2 VIEW Comparison: Portable chest 09/13/2005. Findings: The heart is mildly enlarged.  There is no edema or effusion to suggest failure.  Mild degenerative changes are noted in the thoracic spine.  The lungs are clear.  Mild interstitial coarsening is likely chronic. IMPRESSION: 1.  Borderline cardiomegaly without failure. 2.  No acute cardiopulmonary disease. Original Report Authenticated By: Resa Miner. MATTERN, M.D.   Assessment & Plan:   Vu was seen today for atrial fibrillation and hyperlipidemia.  Diagnoses and all orders for this visit:  Need for influenza vaccination -     Flu vaccine HIGH DOSE PF (Fluzone High Dose)  Atrial fibrillation, unspecified type [facility])- He has had no symptoms recently suspicious for atrial fibrillation.  He has decided not to be anticoagulated.  He will continue to follow with cardiology about this. -     EKG 12-Lead -     Thyroid Panel With TSH; Future  Benign prostatic hyperplasia without lower urinary tract symptoms- His PSA  remains low so I am not concerned about prostate cancer.  He has no symptoms that need to be treated. 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  His labs are negative for any secondary causes. -     Thyroid Panel With TSH; Future  Hyperlipidemia with target LDL less than 70- he has an elevated ASCVD risk score so I have asked him to start taking a statin for CV risk reduction. -     Lipid panel; Future  HYPERTRIGLYCERIDEMIA- improvement noted with lifestyle modification and omega-3 fish oils. -     Comprehensive metabolic panel; Future -     CBC with Differential/Platelet; Future  Obesity (BMI 30.0-34.9)- he is working on his lifestyle modifications to lose weight.  Other orders -     Zoster Vaccine Adjuvanted [facility]) injection; Inject 0.5 mLs once for 1 dose  into the muscle.   I am having [name] "[name]" start on Zoster Vaccine Adjuvanted. I am also having him maintain his naproxen sodium, aspirin, solifenacin, omega-3 acid ethyl esters, and esomeprazole. We will stop administering sodium chloride.  Meds ordered this encounter  Medications  . Zoster Vaccine Adjuvanted [facility]) injection    Sig: Inject 0.5 mLs once for 1 dose into the muscle.    Dispense:  0.5 mL    Refill:  1     Follow-up: Return if symptoms worsen or fail to improve.  Scarlette Calico, MD

## 2017-01-10 NOTE — Patient Instructions (Signed)

## 2017-01-11 ENCOUNTER — Encounter: Payer: Self-pay | Admitting: Internal Medicine

## 2017-01-11 DIAGNOSIS — R899 Unspecified abnormal finding in specimens from other organs, systems and tissues: Secondary | ICD-10-CM

## 2017-01-11 DIAGNOSIS — E781 Pure hyperglyceridemia: Secondary | ICD-10-CM

## 2017-01-11 MED ORDER — ATORVASTATIN CALCIUM 20 MG PO TABS: 20.0000 mg | ORAL_TABLET | Freq: Every day | ORAL | 1 refills | Status: DC

## 2017-01-12 ENCOUNTER — Other Ambulatory Visit: Payer: Self-pay | Admitting: Internal Medicine

## 2017-01-12 DIAGNOSIS — N3281 Overactive bladder: Secondary | ICD-10-CM

## 2017-01-13 ENCOUNTER — Ambulatory Visit (INDEPENDENT_AMBULATORY_CARE_PROVIDER_SITE_OTHER): Payer: Medicare Other | Admitting: Internal Medicine

## 2017-01-13 ENCOUNTER — Encounter: Payer: Self-pay | Admitting: Internal Medicine

## 2017-01-13 VITALS — BP 106/60 | HR 56 | Ht 67.5 in | Wt 201.4 lb

## 2017-01-13 DIAGNOSIS — K439 Ventral hernia without obstruction or gangrene: Secondary | ICD-10-CM

## 2017-01-13 DIAGNOSIS — D122 Benign neoplasm of ascending colon: Secondary | ICD-10-CM | POA: Diagnosis not present

## 2017-01-13 DIAGNOSIS — K219 Gastro-esophageal reflux disease without esophagitis: Secondary | ICD-10-CM | POA: Diagnosis not present

## 2017-01-13 DIAGNOSIS — D696 Thrombocytopenia, unspecified: Secondary | ICD-10-CM

## 2017-01-13 MED ORDER — ESOMEPRAZOLE MAGNESIUM 40 MG PO CPDR: 40.0000 mg | DELAYED_RELEASE_CAPSULE | Freq: Every day | ORAL | 11 refills | Status: DC

## 2017-01-13 NOTE — Patient Instructions (Signed)
We have sent the following medications to your pharmacy for you to pick up at your convenience:  nexium  Please follow up in one year

## 2017-01-13 NOTE — Progress Notes (Signed)
HISTORY OF PRESENT ILLNESS:  Frank [name] is a 76 y.o. male with a history of GERD, complicated by peptic stricture, ventral hernia, and adenomatous colon polyps. He presents today for follow-up regarding management of his chronic GERD. He also has questions regarding recent abnormal lab test. The patient was last seen 08/09/2016 for screening colonoscopy. He was found to have a diminutive adenoma and pandiverticulosis as well as hemorrhoids. Follow-up in 5 years recommended. For GERD the patient has been managed on Nexium 40 mg daily. Off medication he has significant reflux symptoms as soon as that day. Last upper endoscopy was performed in 2013. No evidence for Barrett's esophagus. No other abnormalities. Patient tells me that he is tolerating his medication well. Wishes a prescription refill. He denies dysphagia. No problems with his ventral hernia. I did review the electronic medical record. The laboratory abnormality was thrombocytopenia. Platelets 129,000. This is new for him. He has multiple questions and concerns regarding this finding. He is worried about cancer. His GI review of systems is otherwise negative.  REVIEW OF SYSTEMS:  All non-GI ROS negative entirely. Patient states that he feels well  Past Medical History:  Diagnosis Date  . Diverticulosis   . Esophageal reflux   . Esophageal stricture   . HA (headache)   . Hearing loss   . Hiatal hernia   . Hypertriglyceridemia   . Hypoglycemia   . Obesity, unspecified   . PAF (paroxysmal atrial fibrillation) (HCC)    Echo with normal LV function and mild LVH  . Syncope    presented with atrial fib converted with Diltiazem    Past Surgical History:  Procedure Laterality Date  . NASAL SEPTUM SURGERY    . TONSILLECTOMY    . [place]    Social History [name]  reports that he quit smoking about 29 years ago. he has never used smokeless tobacco. He reports that he does not drink alcohol or use  drugs.  family history includes Bipolar disorder in his paternal grandmother and sister; Emphysema in his father; Gallbladder disease in his sister.  Allergies  Allergen Reactions  . Codeine     REACTION: unspecified  . Peanut-Containing Drug Products     PT STATES HE DOESN'T BELIEVE HE IS ALLERGIC ANYMORE.       PHYSICAL EXAMINATION: Vital signs: BP 106/60   Pulse (!) 56   Ht 5' 7.5" (1.715 m)   Wt 201 lb 6.4 oz (91.4 kg)   BMI 31.08 kg/m   Constitutional: generally well-appearing, no acute distress Psychiatric: alert and oriented x3, cooperative but slightly anxious Eyes: extraocular movements intact, anicteric, conjunctiva pink Mouth: oral pharynx moist, no lesions Neck: supple no lymphadenopathy Cardiovascular: heart regular rate and rhythm, no murmur Lungs: clear to auscultation bilaterally Abdomen: soft, nontender, nondistended, no obvious ascites, no peritoneal signs, normal bowel sounds, no organomegaly. Supraumbilical ventral hernia with spontaneous reduction Rectal: Omitted Extremities: no clubbing, cyanosis, or lower extremity edema bilaterally Skin: no lesions on visible extremities Neuro: No focal deficits. Cranial nerves intact. No asterixis.   ASSESSMENT:  #1. Chronic GERD. Requires PPI for management #2. Previous EGD without Barrett's #3. Thrombocytopenia. Multiple possible etiologies. Several discussed #4. History of adenomatous colon polyp #5. Ventral hernia. Remains asymptomatic  PLAN:  #1. Reflux precautions reviewed #2. Continue Nexium 40 mg daily. Prescription refilled with multiple refills #3. Return to PCP regarding follow-up of thrombocytopenia #4. Routine office follow-up one year #5. Surveillance colonoscopy 5 years #6. Advised to seek  medical attention should he develop pain related to his ventral hernia  25 minutes was spent face-to-face with the patient. Greater than 50% a time was counseling regarding his chronic GERD, issues with  chronic PPI use, basic discussion of thrombocytopenia with recommendation to follow up with PCP, and Advice with regards to monitoring ventral hernia

## 2017-01-18 NOTE — Telephone Encounter (Signed)
Pt called, the labs have not been ordered can these be ordered please? Let me know when they have been and the patient would like an email letting him know.

## 2017-02-04 ENCOUNTER — Other Ambulatory Visit (INDEPENDENT_AMBULATORY_CARE_PROVIDER_SITE_OTHER): Payer: Medicare Other

## 2017-02-04 DIAGNOSIS — E781 Pure hyperglyceridemia: Secondary | ICD-10-CM | POA: Diagnosis not present

## 2017-02-04 DIAGNOSIS — R899 Unspecified abnormal finding in specimens from other organs, systems and tissues: Secondary | ICD-10-CM | POA: Diagnosis not present

## 2017-02-04 LAB — CBC WITH DIFFERENTIAL/PLATELET
BASOS ABS: 0 10*3/uL (ref 0.0–0.1)
Basophils Relative: 0.4 % (ref 0.0–3.0)
EOS ABS: 0.1 10*3/uL (ref 0.0–0.7)
EOS PCT: 1.3 % (ref 0.0–5.0)
HCT: 42.8 % (ref 39.0–52.0)
HEMOGLOBIN: 14.8 g/dL (ref 13.0–17.0)
Lymphocytes Relative: 21.8 % (ref 12.0–46.0)
Lymphs Abs: 1.4 10*3/uL (ref 0.7–4.0)
MCHC: 34.7 g/dL (ref 30.0–36.0)
MCV: 96.3 fl (ref 78.0–100.0)
MONO ABS: 0.5 10*3/uL (ref 0.1–1.0)
Monocytes Relative: 7 % (ref 3.0–12.0)
Neutro Abs: 4.6 10*3/uL (ref 1.4–7.7)
Neutrophils Relative %: 69.5 % (ref 43.0–77.0)
Platelets: 108 10*3/uL — ABNORMAL LOW (ref 150.0–400.0)
RBC: 4.44 Mil/uL (ref 4.22–5.81)
RDW: 14.2 % (ref 11.5–15.5)
WBC: 6.6 10*3/uL (ref 4.0–10.5)

## 2017-02-09 ENCOUNTER — Encounter: Payer: Self-pay | Admitting: Internal Medicine

## 2017-02-14 ENCOUNTER — Other Ambulatory Visit: Payer: Self-pay | Admitting: Internal Medicine

## 2017-02-14 DIAGNOSIS — E781 Pure hyperglyceridemia: Secondary | ICD-10-CM

## 2017-03-31 ENCOUNTER — Telehealth: Payer: Self-pay | Admitting: Cardiovascular Disease

## 2017-03-31 NOTE — Telephone Encounter (Signed)
New Message   Patient c/o Palpitations:  High priority if patient c/o lightheadedness, shortness of breath, or chest pain  1) How long have you had palpitations/irregular HR/ Afib? Are you having the symptoms now? Irregular heartbeats occurred only last night.  2) Are you currently experiencing lightheadedness, SOB or CP? No   3) Do you have a history of afib (atrial fibrillation) or irregular heart rhythm? Yes, about 6 years ago  4) Have you checked your BP or HR? (document readings if available):   5) Are you experiencing any other symptoms? Fatigue

## 2017-03-31 NOTE — Telephone Encounter (Signed)
Spoke with patient who states he had irregular heart rhythm last night. He states he took an aspirin and symptoms subsided quickly. He asks at what point he should go to the ED for a fib. I asked if he had any symptoms like SOB, dizziness, light-headedness, or pre-syncope which he denies. He states he can feel his heart "jumping around" and that his how he knows he is in a fib. I advised him that if he has symptoms and/or if his HR is >120 for a sustained amount of time that he should call 911. I discussed the risk of stroke with the patient and he states he is aware of that and will discuss anti-coagulation with Dr. Acie Fredrickson at the next ov which I was able to move to next week. I advised patient to call back with questions or concerns prior to ov or to call 911 if symptoms are severe. He verbalized understanding and agreement and thanked me for the call.

## 2017-04-06 ENCOUNTER — Ambulatory Visit (INDEPENDENT_AMBULATORY_CARE_PROVIDER_SITE_OTHER): Payer: Medicare Other | Admitting: Cardiovascular Disease

## 2017-04-06 ENCOUNTER — Encounter: Payer: Self-pay | Admitting: Cardiovascular Disease

## 2017-04-06 VITALS — BP 122/68 | HR 51 | Ht 67.0 in | Wt 200.1 lb

## 2017-04-06 DIAGNOSIS — I480 Paroxysmal atrial fibrillation: Secondary | ICD-10-CM | POA: Diagnosis not present

## 2017-04-06 DIAGNOSIS — E782 Mixed hyperlipidemia: Secondary | ICD-10-CM | POA: Diagnosis not present

## 2017-04-06 MED ORDER — APIXABAN 5 MG PO TABS: 5.0000 mg | ORAL_TABLET | Freq: Two times a day (BID) | ORAL | 11 refills | Status: DC
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
## 2017-04-06 NOTE — Patient Instructions (Addendum)
Medication Instructions:  Your physician has recommended you make the following change in your medication:  STOP Aspirin START Eliquis (Apixaban) 5 mg twice daily   Labwork: Your physician recommends that you return for lab work in: 6 months on August 14 You will need to FAST for this appointment - nothing to eat or drink after midnight the night before except water.   Testing/Procedures: None Ordered   Follow-Up: Your physician wants you to follow-up in: 1 year with Dr. Acie Fredrickson.  You will receive a reminder letter in the mail two months in advance. If you don't receive a letter, please call our office to schedule the follow-up appointment.   If you need a refill on your cardiac medications before your next appointment, please call your pharmacy.   Thank you for choosing [facility]! Christen Bame, RN [phone]

## 2017-04-28 ENCOUNTER — Ambulatory Visit: Payer: Medicare Other | Admitting: Cardiovascular Disease

## 2017-07-05 ENCOUNTER — Telehealth: Payer: Self-pay

## 2017-07-05 NOTE — Telephone Encounter (Signed)
no

## 2017-07-05 NOTE — Telephone Encounter (Signed)
Medication for Vesicare is not at [facility]. Is there a specialty pharmacy that this medication does to?

## 2017-07-19 NOTE — Telephone Encounter (Signed)
PA for vesicare is not required per insurance communication.   Call pt mobile number but vm is not set up at this time. Please inform pt of same if he calls back.

## 2017-08-04 ENCOUNTER — Other Ambulatory Visit: Payer: Self-pay | Admitting: Internal Medicine

## 2017-08-04 DIAGNOSIS — N3281 Overactive bladder: Secondary | ICD-10-CM

## 2017-08-05 NOTE — Telephone Encounter (Signed)
Copied from [place] [id number]. Topic: Quick Communication - Rx Refill/Question >> Aug 05, 2017 11:48 AM Oliver Pila B wrote: Medication: VESICARE 10 MG tablet [[id number]]  Has the patient contacted their pharmacy? Yes.   (Agent: If no, request that the patient contact the pharmacy for the refill.) (Agent: If yes, when and what did the pharmacy advise?)  Preferred Pharmacy (with phone number or street name): [facility]  Agent: Please be advised that RX refills may take up to 3 business days. We ask that you follow-up with your pharmacy.

## 2017-08-19 DIAGNOSIS — M722 Plantar fascial fibromatosis: Secondary | ICD-10-CM | POA: Diagnosis not present

## 2017-09-17 ENCOUNTER — Other Ambulatory Visit: Payer: Self-pay | Admitting: Internal Medicine

## 2017-09-17 DIAGNOSIS — E781 Pure hyperglyceridemia: Secondary | ICD-10-CM

## 2017-10-12 ENCOUNTER — Other Ambulatory Visit: Payer: Medicare Other | Admitting: *Deleted

## 2017-10-12 DIAGNOSIS — I480 Paroxysmal atrial fibrillation: Secondary | ICD-10-CM | POA: Diagnosis not present

## 2017-10-12 DIAGNOSIS — E782 Mixed hyperlipidemia: Secondary | ICD-10-CM | POA: Diagnosis not present

## 2017-10-12 LAB — HEPATIC FUNCTION PANEL
ALBUMIN: 4.2 g/dL (ref 3.5–4.8)
ALK PHOS: 53 IU/L (ref 39–117)
ALT: 16 IU/L (ref 0–44)
AST: 15 IU/L (ref 0–40)
BILIRUBIN TOTAL: 0.5 mg/dL (ref 0.0–1.2)
Bilirubin, Direct: 0.13 mg/dL (ref 0.00–0.40)
Total Protein: 6.4 g/dL (ref 6.0–8.5)

## 2017-10-12 LAB — LIPID PANEL
CHOL/HDL RATIO: 5.9 ratio — AB (ref 0.0–5.0)
Cholesterol, Total: 159 mg/dL (ref 100–199)
HDL: 27 mg/dL — AB (ref >39)
LDL Calculated: 95 mg/dL (ref 0–99)
Triglycerides: 183 mg/dL — ABNORMAL HIGH (ref 0–149)
VLDL Cholesterol Cal: 37 mg/dL (ref 5–40)

## 2017-10-12 LAB — BASIC METABOLIC PANEL
BUN / CREAT RATIO: 16 (ref 10–24)
BUN: 13 mg/dL (ref 8–27)
CALCIUM: 8.8 mg/dL (ref 8.6–10.2)
CHLORIDE: 105 mmol/L (ref 96–106)
CO2: 21 mmol/L (ref 20–29)
Creatinine, Ser: 0.82 mg/dL (ref 0.76–1.27)
GFR calc Af Amer: 100 mL/min/{1.73_m2} (ref >59)
GFR calc non Af Amer: 87 mL/min/{1.73_m2} (ref >59)
Glucose: 97 mg/dL (ref 65–99)
Potassium: 4.1 mmol/L (ref 3.5–5.2)
Sodium: 140 mmol/L (ref 134–144)

## 2017-10-12 LAB — CBC
Hematocrit: 40.5 % (ref 37.5–51.0)
Hemoglobin: 14.1 g/dL (ref 13.0–17.7)
MCH: 33.7 pg — AB (ref 26.6–33.0)
MCHC: 34.8 g/dL (ref 31.5–35.7)
MCV: 97 fL (ref 79–97)
Platelets: 131 10*3/uL — ABNORMAL LOW (ref 150–450)
RBC: 4.19 x10E6/uL (ref 4.14–5.80)
RDW: 14.7 % (ref 12.3–15.4)
WBC: 5.6 10*3/uL (ref 3.4–10.8)

## 2017-11-28 DIAGNOSIS — H5203 Hypermetropia, bilateral: Secondary | ICD-10-CM | POA: Diagnosis not present

## 2017-11-28 DIAGNOSIS — H2513 Age-related nuclear cataract, bilateral: Secondary | ICD-10-CM | POA: Diagnosis not present

## 2018-01-10 NOTE — Progress Notes (Addendum)
Subjective:   [name] is a 77 y.o. male who presents for Medicare Annual/Subsequent preventive examination.  Review of Systems:  No ROS.  Medicare Wellness Visit. Additional risk factors are reflected in the social history.  Cardiac Risk Factors include: advanced age (>39men, >36 women);male gender Sleep patterns: feels rested on waking, gets up 1-2 times nightly to void and sleeps 8-9 hours nightly.    Home Safety/Smoke Alarms: Feels safe in home. Smoke alarms in place.  Living environment; residence and Firearm Safety: 2-story house, no firearms . Lives with wife, no needs for DME, good support system Seat Belt Safety/Bike Helmet: Wears seat belt.   PSA-  Lab Results  Component Value Date   PSA 0.16 01/10/2017   PSA 0.15 01/06/2016   PSA 0.15 12/25/2014       Objective:    Vitals: BP 122/70   Pulse (!) 55   Resp 16   Ht 5\' 7"  (1.702 m)   Wt 200 lb (90.7 kg)   SpO2 98%   BMI 31.32 kg/m   Body mass index is 31.32 kg/m.  Advanced Directives 01/11/2018 07/27/2016 01/07/2016 12/26/2014 12/25/2014 04/16/2011  Does Patient Have a Medical Advance Directive? Yes Yes Yes - Yes Patient has advance directive, copy not in chart  Type of Advance Directive [place];Living will [place];Living will [place];Living will [place];Living will [place];Living will Living will  Does patient want to make changes to medical advance directive? - - No - Patient declined No - Patient declined No - Patient declined -  Copy of [place] in Chart? No - copy requested No - copy requested Yes Yes Yes -  Pre-existing out of facility DNR order (yellow form or pink MOST form) - - - - - No    Tobacco Social History   Tobacco Use  Smoking Status Former Smoker  . Last attempt to quit: 03/02/1987  . Years since quitting: 30.8  Smokeless Tobacco Never Used     Counseling  given: Not Answered  Past Medical History:  Diagnosis Date  . Diverticulosis   . Esophageal reflux   . Esophageal stricture   . HA (headache)   . Hearing loss   . Hiatal hernia   . Hypertriglyceridemia   . Hypoglycemia   . Obesity, unspecified   . PAF (paroxysmal atrial fibrillation) (HCC)    Echo with normal LV function and mild LVH  . Syncope    presented with atrial fib converted with Diltiazem   Past Surgical History:  Procedure Laterality Date  . NASAL SEPTUM SURGERY    . TONSILLECTOMY    . UMBILICAL HERNIA REPAIR  1993   Family History  Problem Relation Age of Onset  . Emphysema Father   . Gallbladder disease Sister   . Bipolar disorder Sister   . Bipolar disorder Paternal Grandmother   . Colon cancer Neg Hx    Social History   Socioeconomic History  . Marital status: Married    Spouse name: Not on file  . Number of children: 1  . Years of education: Not on file  . Highest education level: Not on file  Occupational History  . 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 Financial resource strain: Not hard at all  . Food insecurity:    Worry: Never true    Inability: Never true  . Transportation needs:    Medical: No    Non-medical:  No  Tobacco Use  . Smoking status: Former Smoker    Last attempt to quit: 03/02/1987    Years since quitting: 30.8  . Smokeless tobacco: Never Used  Substance and Sexual Activity  . Alcohol use: No    Alcohol/week: 0.0 standard drinks  . Drug use: No  . Sexual activity: Yes  Lifestyle  . Physical activity:    Days per week: 5 days    Minutes per session: 50 min  . Stress: Not at all  Relationships  . Social connections:    Talks on phone: More than three times a week    Gets together: More than three times a week    Attends religious service: 1 to 4 times per year    Active member of club or organization: Yes    Attends meetings of clubs or organizations: More than 4 times per year    Relationship status: Married  Other  Topics Concern  . Not on file  Social History Narrative  . Not on file    Outpatient Encounter Medications as of 01/11/2018  Medication Sig  . apixaban (ELIQUIS) 5 MG TABS tablet Take 1 tablet (5 mg total) by mouth 2 (two) times daily.  Marland Kitchen atorvastatin (LIPITOR) 20 MG tablet Take 1 tablet (20 mg total) by mouth daily.  Marland Kitchen esomeprazole (NEXIUM) 40 MG capsule Take 1 capsule (40 mg total) daily by mouth.  . omega-3 acid ethyl esters (LOVAZA) 1 g capsule TAKE 2 CAPSULES BY MOUTH TWICE DAILY  . VESICARE 10 MG tablet TAKE 1 TABLET BY MOUTH ONCE DAILY  . [DISCONTINUED] atorvastatin (LIPITOR) 20 MG tablet Take 1 tablet (20 mg total) daily by mouth. (Patient not taking: Reported on 04/06/2017)  . [DISCONTINUED] naproxen sodium (ANAPROX) 550 MG tablet Take 550 mg by mouth as needed for moderate pain.    No facility-administered encounter medications on file as of 01/11/2018.     Activities of Daily Living In your present state of health, do you have any difficulty performing the following activities: 01/11/2018  Hearing? N  Vision? N  Difficulty concentrating or making decisions? N  Walking or climbing stairs? N  Dressing or bathing? N  Doing errands, shopping? N  Preparing Food and eating ? N  Using the Toilet? N  In the past six months, have you accidently leaked urine? N  Do you have problems with loss of bowel control? N  Managing your Medications? N  Managing your Finances? N  Housekeeping or managing your Housekeeping? N  Some recent data might be hidden    Patient Care Team: [name], MD as PCP - General Nah[place], Wonda Cheng, MD as PCP - Cardiology (Cardiology) Irene Shipper, MD as Consulting Physician (Gastroenterology)   Assessment:   This is a routine wellness examination for [name]. Physical assessment deferred to PCP.   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  Goals    . Patient Stated     Stay as healthy and as independent as possible.       Fall Risk Fall Risk  01/11/2018 01/10/2017 02/06/2016 01/07/2016 12/26/2014  Falls in the past year? 0 No No No No  Comment - - Emmi Telephone Survey: data to providers prior to load - -    Depression Screen PHQ 2/9 Scores 01/11/2018 01/10/2017 01/07/2016 12/26/2014  PHQ - 2 Score 0 0 0 0  PHQ- 9 Score 0 - - -    Cognitive Function MMSE - Mini Mental State Exam 01/11/2018  Not completed: Refused       Ad8 score reviewed for issues:  Issues making decisions: no  Less interest in hobbies / activities: no  Repeats questions, stories (family complaining): no  Trouble using ordinary gadgets (microwave, computer, phone):no  Forgets the month or year: no  Mismanaging finances: no  Remembering appts: no  Daily problems with thinking and/or memory: no Ad8 score is= 0  Immunization History  Administered Date(s) Administered  . Influenza Whole 01/19/2010  . Influenza, High Dose Seasonal PF 01/06/2016, 01/10/2017, 01/11/2018  . Influenza,inj,Quad PF,6+ Mos 11/28/2012, 12/03/2013, 12/25/2014  . Pneumococcal Conjugate-13 11/28/2012  . Pneumococcal Polysaccharide-23 12/03/2013  . Td 03/01/2006  . Tdap 04/07/2016  . Zoster 03/30/2013  . Zoster Recombinat (Shingrix) 10/18/2017   Screening Tests Health Maintenance  Topic Date Due  . COLONOSCOPY  08/09/2021  . TETANUS/TDAP  04/07/2026  . INFLUENZA VACCINE  Completed  . PNA vac Low Risk Adult   Completed      Plan:      I have personally reviewed and noted the following in the patient's chart:   . Medical and social history . Use of alcohol, tobacco or illicit drugs  . Current medications and supplements . Functional ability and status . Nutritional status . Physical activity . Advanced directives . List of other physicians . Vitals . Screenings to include cognitive, depression, and falls . Referrals and appointments  In addition, I have reviewed and discussed with patient certain preventive protocols, quality metrics, and best practice recommendations. A written personalized care plan for preventive services as well as general preventive health recommendations were provided to patient.     Michiel Cowboy, RN  01/11/2018  Medical screening examination/treatment/procedure(s) were performed by non-physician practitioner and as supervising physician I was immediately available for consultation/collaboration. I agree with above. Scarlette Calico, MD

## 2018-01-11 ENCOUNTER — Other Ambulatory Visit (INDEPENDENT_AMBULATORY_CARE_PROVIDER_SITE_OTHER): Payer: Medicare Other

## 2018-01-11 ENCOUNTER — Ambulatory Visit (INDEPENDENT_AMBULATORY_CARE_PROVIDER_SITE_OTHER): Payer: Medicare Other | Admitting: Internal Medicine

## 2018-01-11 ENCOUNTER — Encounter: Payer: Self-pay | Admitting: Internal Medicine

## 2018-01-11 ENCOUNTER — Ambulatory Visit (INDEPENDENT_AMBULATORY_CARE_PROVIDER_SITE_OTHER): Payer: Medicare Other | Admitting: *Deleted

## 2018-01-11 VITALS — BP 122/70 | HR 55 | Resp 16 | Ht 67.0 in | Wt 200.0 lb

## 2018-01-11 VITALS — BP 122/70 | HR 55 | Temp 97.6°F | Resp 16 | Wt 200.0 lb

## 2018-01-11 DIAGNOSIS — E781 Pure hyperglyceridemia: Secondary | ICD-10-CM | POA: Diagnosis not present

## 2018-01-11 DIAGNOSIS — E785 Hyperlipidemia, unspecified: Secondary | ICD-10-CM | POA: Diagnosis not present

## 2018-01-11 DIAGNOSIS — Z0000 Encounter for general adult medical examination without abnormal findings: Secondary | ICD-10-CM

## 2018-01-11 DIAGNOSIS — Z23 Encounter for immunization: Secondary | ICD-10-CM | POA: Diagnosis not present

## 2018-01-11 DIAGNOSIS — N400 Enlarged prostate without lower urinary tract symptoms: Secondary | ICD-10-CM

## 2018-01-11 LAB — PSA: PSA: 0.18 ng/mL (ref 0.10–4.00)

## 2018-01-11 LAB — TSH: TSH: 2.01 u[IU]/mL (ref 0.35–4.50)

## 2018-01-11 MED ORDER — ATORVASTATIN CALCIUM 20 MG PO TABS: 20.0000 mg | ORAL_TABLET | Freq: Every day | ORAL | 1 refills | Status: DC

## 2018-01-11 NOTE — Patient Instructions (Signed)

## 2018-01-11 NOTE — Progress Notes (Signed)
Subjective:  Patient ID: [name], male    DOB: 1940/05/28  Age: 77 y.o. MRN: [id number]  CC: Annual Exam and Hyperlipidemia   HPI [name] presents for a CPX.  He feels well today and offers no complaints.  He recently saw cardiology and is doing well with respect to his history of atrial fibrillation.  He denies any recent episodes of palpitations, CP, DOE, edema, or fatigue.  Past Medical History:  Diagnosis Date  . Diverticulosis   . Esophageal reflux   . Esophageal stricture   . HA (headache)   . Hearing loss   . Hiatal hernia   . Hypertriglyceridemia   . Hypoglycemia   . Obesity, unspecified   . PAF (paroxysmal atrial fibrillation) (HCC)    Echo with normal LV function and mild LVH  . Syncope    presented with atrial fib converted with Diltiazem   Past Surgical History:  Procedure Laterality Date  . NASAL SEPTUM SURGERY    . TONSILLECTOMY    . [place]    reports that he quit smoking about 30 years ago. He has never used smokeless tobacco. He reports that he does not drink alcohol or use drugs. family history includes Bipolar disorder in his paternal grandmother and sister; Emphysema in his father; Gallbladder disease in his sister. Allergies  Allergen Reactions  . Codeine     REACTION: unspecified  . Peanut-Containing Drug Products     PT STATES HE DOESN'T BELIEVE HE IS ALLERGIC ANYMORE.    Outpatient Medications Prior to Visit  Medication Sig Dispense Refill  . apixaban (ELIQUIS) 5 MG TABS tablet Take 1 tablet (5 mg total) by mouth 2 (two) times daily. 60 tablet 11  . esomeprazole (NEXIUM) 40 MG capsule Take 1 capsule (40 mg total) daily by mouth. 30 capsule 11  . omega-3 acid ethyl esters (LOVAZA) 1 g capsule TAKE 2 CAPSULES BY MOUTH TWICE DAILY 360 capsule 1  . VESICARE 10 MG tablet TAKE 1 TABLET BY MOUTH ONCE DAILY 30 tablet 5  . naproxen sodium (ANAPROX) 550 MG tablet Take 550 mg by mouth as needed for moderate  pain.     Marland Kitchen atorvastatin (LIPITOR) 20 MG tablet Take 1 tablet (20 mg total) daily by mouth. (Patient not taking: Reported on 04/06/2017) 90 tablet 1   No facility-administered medications prior to visit.     ROS Review of Systems  Constitutional: Negative.  Negative for appetite change, diaphoresis, fatigue and unexpected weight change.  HENT: Negative.   Eyes: Negative for visual disturbance.  Respiratory: Negative.  Negative for cough, chest tightness, shortness of breath and wheezing.   Cardiovascular: Negative for chest pain, palpitations and leg swelling.  Gastrointestinal: Negative for abdominal pain, constipation, diarrhea, nausea and vomiting.  Endocrine: Negative.   Genitourinary: Negative.  Negative for difficulty urinating, dysuria, penile swelling, scrotal swelling, testicular pain and urgency.  Musculoskeletal: Negative.  Negative for arthralgias and myalgias.  Skin: Negative.  Negative for color change and pallor.  Neurological: Negative.  Negative for dizziness, weakness and light-headedness.  Hematological: Negative for adenopathy. Does not bruise/bleed easily.  Psychiatric/Behavioral: Negative.     Objective:  BP 122/70   Pulse (!) 55   Temp 97.6 F (36.4 C) (Oral)   Resp 16   Wt 200 lb (90.7 kg)   SpO2 98%   BMI 31.32 kg/m   BP Readings from Last 3 Encounters:  01/11/18 122/70  01/11/18 122/70  04/06/17 122/68  Wt Readings from Last 3 Encounters:  01/11/18 200 lb (90.7 kg)  01/11/18 200 lb (90.7 kg)  04/06/17 200 lb 1.9 oz (90.8 kg)    Physical Exam  Constitutional: He is oriented to person, place, and time. No distress.  HENT:  Mouth/Throat: Oropharynx is clear and moist. No oropharyngeal exudate.  Eyes: Conjunctivae are normal. No scleral icterus.  Neck: Normal range of motion. Neck supple. No JVD present. No thyromegaly present.  Cardiovascular: Normal rate, regular rhythm and normal heart sounds. Exam reveals no gallop.  No murmur  heard. Pulmonary/Chest: Effort normal and breath sounds normal. No respiratory distress. He has no wheezes. He has no rales.  Abdominal: Soft. Bowel sounds are normal. He exhibits no mass. There is no hepatosplenomegaly. There is no tenderness. Hernia confirmed negative in the right inguinal area and confirmed negative in the left inguinal area.  Genitourinary: Rectum normal, testes normal and penis normal. Rectal exam shows no external hemorrhoid, no internal hemorrhoid, no fissure, no mass, no tenderness, anal tone normal and guaiac negative stool. Prostate is enlarged (1+ smooth symm BPH). Prostate is not tender. Right testis shows no mass, no swelling and no tenderness. Left testis shows no mass, no swelling and no tenderness. Circumcised. No penile erythema or penile tenderness. No discharge found.  Musculoskeletal: Normal range of motion. He exhibits no edema, tenderness or deformity.  Lymphadenopathy:    He has no cervical adenopathy. No inguinal adenopathy noted on the right or left side.  Neurological: He is alert and oriented to person, place, and time.  Skin: Skin is warm and dry. No rash noted. He is not diaphoretic.  Psychiatric: He has a normal mood and affect. His behavior is normal. Judgment and thought content normal.  Vitals reviewed.   Lab Results  Component Value Date   WBC 5.6 10/12/2017   HGB 14.1 10/12/2017   HCT 40.5 10/12/2017   PLT 131 (L) 10/12/2017   GLUCOSE 97 10/12/2017   CHOL 159 10/12/2017   TRIG 183 (H) 10/12/2017   HDL 27 (L) 10/12/2017   LDLDIRECT 91.0 04/23/2015   LDLCALC 95 10/12/2017   ALT 16 10/12/2017   AST 15 10/12/2017   NA 140 10/12/2017   K 4.1 10/12/2017   CL 105 10/12/2017   CREATININE 0.82 10/12/2017   BUN 13 10/12/2017   CO2 21 10/12/2017   TSH 2.01 01/11/2018   PSA 0.18 01/11/2018   INR 1.00 04/15/2011   HGBA1C 5.5 11/28/2012    Dg Chest 2 View  Result Date: 04/15/2011 *RADIOLOGY REPORT* Clinical Data: Syncopal episode. CHEST -  2 VIEW Comparison: Portable chest 09/13/2005. Findings: The heart is mildly enlarged.  There is no edema or effusion to suggest failure.  Mild degenerative changes are noted in the thoracic spine.  The lungs are clear.  Mild interstitial coarsening is likely chronic. IMPRESSION: 1.  Borderline cardiomegaly without failure. 2.  No acute cardiopulmonary disease. Original Report Authenticated By: Resa Miner. MATTERN, M.D.   Assessment & Plan:   Gamble was seen today for annual exam and hyperlipidemia.  Diagnoses and all orders for this visit:  Hyperlipidemia with target LDL less than 70- He has achieved his LDL goal is doing well on the statin. -     TSH; Future -     atorvastatin (LIPITOR) 20 MG tablet; Take 1 tablet (20 mg total) by mouth daily.  Benign prostatic hyperplasia without lower urinary tract symptoms- His PSA is low which is reassuring that he does not have prostate cancer.  He has no symptoms that need to be treated. -     PSA; Future  HYPERTRIGLYCERIDEMIA- Improvement noted.  Routine general medical examination at a health care facility  Need for influenza vaccination -     Flu vaccine HIGH DOSE PF (Fluzone High dose)   I have discontinued [name] Jr.'s naproxen sodium. I have also changed his atorvastatin. Additionally, I am having him maintain his esomeprazole, apixaban, VESICARE, and omega-3 acid ethyl esters.  Meds ordered this encounter  Medications  . atorvastatin (LIPITOR) 20 MG tablet    Sig: Take 1 tablet (20 mg total) by mouth daily.    Dispense:  90 tablet    Refill:  1   See AVS for instructions about healthy living and anticipatory guidance.  Follow-up: Return in about 6 months (around 07/12/2018).  Scarlette Calico, MD

## 2018-01-11 NOTE — Patient Instructions (Addendum)
Continue doing brain stimulating activities (puzzles, reading, adult coloring books, staying active) to keep memory sharp.   Continue to eat heart healthy diet (full of fruits, vegetables, whole grains, lean protein, water--limit salt, fat, and sugar intake) and increase physical activity as tolerated.   Frank [name] , Thank you for taking time to come for your Medicare Wellness Visit. I appreciate your ongoing commitment to your health goals. Please review the following plan we discussed and let me know if I can assist you in the future.   These are the goals we discussed: Goals    . Patient Stated     Stay as healthy and as independent as possible.       This is a list of the screening recommended for you and due dates:  Health Maintenance  Topic Date Due  . Colon Cancer Screening  08/09/2021  . Tetanus Vaccine  04/07/2026  . Flu Shot  Completed  . Pneumonia vaccines  Completed     Health Maintenance, Male A healthy lifestyle and preventive care is important for your health and wellness. Ask your health care provider about what schedule of regular examinations is right for you. What should I know about weight and diet? Eat a Healthy Diet  Eat plenty of vegetables, fruits, whole grains, low-fat dairy products, and lean protein.  Do not eat a lot of foods high in solid fats, added sugars, or salt.  Maintain a Healthy Weight Regular exercise can help you achieve or maintain a healthy weight. You should:  Do at least 150 minutes of exercise each week. The exercise should increase your heart rate and make you sweat (moderate-intensity exercise).  Do strength-training exercises at least twice a week.  Watch Your Levels of Cholesterol and Blood Lipids  Have your blood tested for lipids and cholesterol every 5 years starting at 76 years of age. If you are at high risk for heart disease, you should start having your blood tested when you are 76 years old. You may need to have your  cholesterol levels checked more often if: ? Your lipid or cholesterol levels are high. ? You are older than 76 years of age. ? You are at high risk for heart disease.  What should I know about cancer screening? Many types of cancers can be detected early and may often be prevented. Lung Cancer  You should be screened every year for lung cancer if: ? You are a current smoker who has smoked for at least 30 years. ? You are a former smoker who has quit within the past 15 years.  Talk to your health care provider about your screening options, when you should start screening, and how often you should be screened.  Colorectal Cancer  Routine colorectal cancer screening usually begins at 76 years of age and should be repeated every 5-10 years until you are 76 years old. You may need to be screened more often if early forms of precancerous polyps or small growths are found. Your health care provider may recommend screening at an earlier age if you have risk factors for colon cancer.  Your health care provider may recommend using home test kits to check for hidden blood in the stool.  A small camera at the end of a tube can be used to examine your colon (sigmoidoscopy or colonoscopy). This checks for the earliest forms of colorectal cancer.  Prostate and Testicular Cancer  Depending on your age and overall health, your health care provider may do  certain tests to screen for prostate and testicular cancer.  Talk to your health care provider about any symptoms or concerns you have about testicular or prostate cancer.  Skin Cancer  Check your skin from head to toe regularly.  Tell your health care provider about any new moles or changes in moles, especially if: ? There is a change in a mole's size, shape, or color. ? You have a mole that is larger than a pencil eraser.  Always use sunscreen. Apply sunscreen liberally and repeat throughout the day.  Protect yourself by wearing long sleeves,  pants, a wide-brimmed hat, and sunglasses when outside.  What should I know about heart disease, diabetes, and high blood pressure?  If you are 40-69 years of age, have your blood pressure checked every 3-5 years. If you are 59 years of age or older, have your blood pressure checked every year. You should have your blood pressure measured twice-once when you are at a hospital or clinic, and once when you are not at a hospital or clinic. Record the average of the two measurements. To check your blood pressure when you are not at a hospital or clinic, you can use: ? An automated blood pressure machine at a pharmacy. ? A home blood pressure monitor.  Talk to your health care provider about your target blood pressure.  If you are between 86-2 years old, ask your health care provider if you should take aspirin to prevent heart disease.  Have regular diabetes screenings by checking your fasting blood sugar level. ? If you are at a normal weight and have a low risk for diabetes, have this test once every three years after the age of 53. ? If you are overweight and have a high risk for diabetes, consider being tested at a younger age or more often.  A one-time screening for abdominal aortic aneurysm (AAA) by ultrasound is recommended for men aged 6-75 years who are current or former smokers. What should I know about preventing infection? Hepatitis B If you have a higher risk for hepatitis B, you should be screened for this virus. Talk with your health care provider to find out if you are at risk for hepatitis B infection. Hepatitis C Blood testing is recommended for:  Everyone born from [birth year] through 1964.  Anyone with known risk factors for hepatitis C.  Sexually Transmitted Diseases (STDs)  You should be screened each year for STDs including gonorrhea and chlamydia if: ? You are sexually active and are younger than 76 years of age. ? You are older than 76 years of age and your health care  provider tells you that you are at risk for this type of infection. ? Your sexual activity has changed since you were last screened and you are at an increased risk for chlamydia or gonorrhea. Ask your health care provider if you are at risk.  Talk with your health care provider about whether you are at high risk of being infected with HIV. Your health care provider may recommend a prescription medicine to help prevent HIV infection.  What else can I do?  Schedule regular health, dental, and eye exams.  Stay current with your vaccines (immunizations).  Do not use any tobacco products, such as cigarettes, chewing tobacco, and e-cigarettes. If you need help quitting, ask your health care provider.  Limit alcohol intake to no more than 2 drinks per day. One drink equals 12 ounces of beer, 5 ounces of [place], or 1 ounces of  hard liquor.  Do not use street drugs.  Do not share needles.  Ask your health care provider for help if you need support or information about quitting drugs.  Tell your health care provider if you often feel depressed.  Tell your health care provider if you have ever been abused or do not feel safe at home. This information is not intended to replace advice given to you by your health care provider. Make sure you discuss any questions you have with your health care provider. Document Released: 08/14/2007 Document Revised: 10/15/2015 Document Reviewed: 11/19/2014 Elsevier Interactive Patient Education  [employer].

## 2018-01-15 NOTE — Assessment & Plan Note (Signed)

## 2018-01-16 ENCOUNTER — Encounter: Payer: Self-pay | Admitting: Internal Medicine

## 2018-01-16 ENCOUNTER — Ambulatory Visit: Payer: Medicare Other | Admitting: Internal Medicine

## 2018-01-16 VITALS — BP 106/70 | HR 64 | Ht 65.35 in | Wt 202.5 lb

## 2018-01-16 DIAGNOSIS — K439 Ventral hernia without obstruction or gangrene: Secondary | ICD-10-CM | POA: Diagnosis not present

## 2018-01-16 DIAGNOSIS — D122 Benign neoplasm of ascending colon: Secondary | ICD-10-CM | POA: Diagnosis not present

## 2018-01-16 DIAGNOSIS — K219 Gastro-esophageal reflux disease without esophagitis: Secondary | ICD-10-CM

## 2018-01-16 MED ORDER — ESOMEPRAZOLE MAGNESIUM 40 MG PO CPDR: 40.0000 mg | DELAYED_RELEASE_CAPSULE | Freq: Every day | ORAL | 11 refills | Status: DC

## 2018-01-16 NOTE — Patient Instructions (Signed)
We have sent the following medications to your pharmacy for you to pick up at your convenience:  nexium  Please follow up in one year

## 2018-01-16 NOTE — Progress Notes (Signed)
HISTORY OF PRESENT ILLNESS:  Frank Brault. is a 77 y.o. male with GERD, history of peptic stricture, ventral hernia, and adenomatous colon polyps.  Last evaluated in this office November 2018.  See that dictation for details.  Patient presents today for routine follow-up.  He does require daily PPI to control reflux symptoms.  No recurrent dysphasia.  His ventral hernia remains asymptomatic and is unchanged according to the patient.  He did undergo complete colonoscopy in June 2018 and was found to have a tubular adenoma for which routine follow-up in 5 years recommended.  He does have several questions including review of some recent blood work.  Review of recent blood work from last week shows normal PSA and TSH.  He also had questions regarding the timing of his medication  REVIEW OF SYSTEMS:  All non-GI ROS negative unless otherwise stated in the HPI except for cough, hearing problems, muscle cramps, excessive urination with urinary frequency  Past Medical History:  Diagnosis Date  . Diverticulosis   . Esophageal reflux   . Esophageal stricture   . HA (headache)   . Hearing loss   . Hiatal hernia   . Hypertriglyceridemia   . Hypoglycemia   . Obesity, unspecified   . PAF (paroxysmal atrial fibrillation) (HCC)    Echo with normal LV function and mild LVH  . Syncope    presented with atrial fib converted with Diltiazem    Past Surgical History:  Procedure Laterality Date  . NASAL SEPTUM SURGERY    . TONSILLECTOMY    . [place]    Social History [name]  reports that he quit smoking about 30 years ago. He has never used smokeless tobacco. He reports that he does not drink alcohol or use drugs.  family history includes Bipolar disorder in his paternal grandmother and sister; Emphysema in his father; Gallbladder disease in his sister.  Allergies  Allergen Reactions  . Codeine     REACTION: unspecified  . Peanut-Containing Drug Products     PT STATES HE DOESN'T BELIEVE HE IS ALLERGIC ANYMORE.       PHYSICAL EXAMINATION: Vital signs: BP 106/70 (BP Location: Left Arm, Patient Position: Sitting, Cuff Size: Normal)   Pulse 64   Ht 5' 5.35" (1.66 m) Comment: height measured wihtout shoes  Wt 202 lb 8 oz (91.9 kg)   BMI 33.33 kg/m   Constitutional: generally well-appearing, no acute distress Psychiatric: alert and oriented x3, cooperative Eyes: extraocular movements intact, anicteric, conjunctiva pink Mouth: oral pharynx moist, no lesions Neck: supple no lymphadenopathy Cardiovascular: heart regular rate and rhythm, no murmur Lungs: clear to auscultation bilaterally Abdomen: soft, nontender, nondistended, no obvious ascites, no peritoneal signs, normal bowel sounds, no organomegaly.  Small ventral hernia above the umbilicus Rectal: Omitted Extremities: no rubbing, cyanosis, or lower extremity edema bilaterally Skin: no lesions on visible extremities Neuro: No focal deficits.  Cranial nerves intact  ASSESSMENT:  1.  GERD.  Asymptomatic on PPI.  Requires PPI to control symptoms 2.  Ventral hernia.  Stable 3.  History of adenomatous colon polyps.  Surveillance up-to-date   PLAN:  1.  Reflux precautions 2.  Refill Nexium 40 mg daily.  Proper timing of medication reviewed 3.  Surveillance colonoscopy around 2023 4.  Routine office follow-up regarding ongoing management of GERD 1 year  50 minutes spent face-to-face with the patient.  Greater than 50% the time used for counseling regarding his chronic GERD

## 2018-02-23 ENCOUNTER — Telehealth: Payer: Self-pay | Admitting: Internal Medicine

## 2018-02-23 ENCOUNTER — Other Ambulatory Visit: Payer: Self-pay | Admitting: Internal Medicine

## 2018-02-23 DIAGNOSIS — N3281 Overactive bladder: Secondary | ICD-10-CM

## 2018-02-23 NOTE — Telephone Encounter (Signed)
Copied from [place] #[id number]. Topic: General - Other >> Feb 23, 2018 11:00 AM Judyann Munson wrote: Reason for CRM: patient is calling to state his insurance called to state the medication VESICARE 10 MG tablet  has a generic brand that is cheaper called  Solifenacin tab 10mg   that will take affect for 03-01-2018

## 2018-03-02 MED ORDER — SOLIFENACIN SUCCINATE 10 MG PO TABS: 10.0000 mg | ORAL_TABLET | Freq: Every day | ORAL | 1 refills | Status: DC

## 2018-03-02 NOTE — Telephone Encounter (Signed)
erx for generic Vesicare has been sent in.

## 2018-04-10 ENCOUNTER — Encounter: Payer: Self-pay | Admitting: Cardiovascular Disease

## 2018-04-10 ENCOUNTER — Ambulatory Visit: Payer: Medicare Other | Admitting: Cardiovascular Disease

## 2018-04-10 VITALS — BP 126/72 | HR 52 | Ht 65.35 in | Wt 202.1 lb

## 2018-04-10 DIAGNOSIS — I480 Paroxysmal atrial fibrillation: Secondary | ICD-10-CM | POA: Diagnosis not present

## 2018-04-10 NOTE — Patient Instructions (Signed)
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
## 2018-04-24 ENCOUNTER — Other Ambulatory Visit: Payer: Self-pay

## 2018-04-24 ENCOUNTER — Observation Stay (HOSPITAL_COMMUNITY)
Admission: EM | Admit: 2018-04-24 | Discharge: 2018-04-26 | Disposition: A | Discharge status: Home / Self Care | Payer: Medicare Other | Attending: Internal Medicine | Admitting: Internal Medicine

## 2018-04-24 ENCOUNTER — Emergency Department (HOSPITAL_COMMUNITY): Payer: Medicare Other

## 2018-04-24 ENCOUNTER — Encounter (HOSPITAL_COMMUNITY): Payer: Self-pay | Admitting: Emergency Medicine

## 2018-04-24 DIAGNOSIS — D696 Thrombocytopenia, unspecified: Secondary | ICD-10-CM

## 2018-04-24 DIAGNOSIS — R55 Syncope and collapse: Secondary | ICD-10-CM | POA: Diagnosis not present

## 2018-04-24 DIAGNOSIS — N3281 Overactive bladder: Secondary | ICD-10-CM | POA: Diagnosis not present

## 2018-04-24 DIAGNOSIS — R2689 Other abnormalities of gait and mobility: Secondary | ICD-10-CM | POA: Insufficient documentation

## 2018-04-24 DIAGNOSIS — E669 Obesity, unspecified: Secondary | ICD-10-CM | POA: Diagnosis not present

## 2018-04-24 DIAGNOSIS — I480 Paroxysmal atrial fibrillation: Secondary | ICD-10-CM

## 2018-04-24 DIAGNOSIS — Z79899 Other long term (current) drug therapy: Secondary | ICD-10-CM | POA: Diagnosis not present

## 2018-04-24 DIAGNOSIS — Z6833 Body mass index (BMI) 33.0-33.9, adult: Secondary | ICD-10-CM | POA: Diagnosis not present

## 2018-04-24 DIAGNOSIS — K219 Gastro-esophageal reflux disease without esophagitis: Secondary | ICD-10-CM | POA: Diagnosis not present

## 2018-04-24 DIAGNOSIS — K449 Diaphragmatic hernia without obstruction or gangrene: Secondary | ICD-10-CM | POA: Insufficient documentation

## 2018-04-24 DIAGNOSIS — R1111 Vomiting without nausea: Secondary | ICD-10-CM | POA: Diagnosis not present

## 2018-04-24 DIAGNOSIS — R4020 Unspecified coma: Secondary | ICD-10-CM

## 2018-04-24 DIAGNOSIS — Z7901 Long term (current) use of anticoagulants: Secondary | ICD-10-CM | POA: Diagnosis not present

## 2018-04-24 DIAGNOSIS — E785 Hyperlipidemia, unspecified: Secondary | ICD-10-CM | POA: Diagnosis not present

## 2018-04-24 DIAGNOSIS — R110 Nausea: Secondary | ICD-10-CM | POA: Diagnosis not present

## 2018-04-24 DIAGNOSIS — R531 Weakness: Secondary | ICD-10-CM | POA: Diagnosis not present

## 2018-04-24 DIAGNOSIS — Z87891 Personal history of nicotine dependence: Secondary | ICD-10-CM | POA: Insufficient documentation

## 2018-04-24 DIAGNOSIS — R824 Acetonuria: Secondary | ICD-10-CM

## 2018-04-24 DIAGNOSIS — Z885 Allergy status to narcotic agent status: Secondary | ICD-10-CM | POA: Diagnosis not present

## 2018-04-24 DIAGNOSIS — E781 Pure hyperglyceridemia: Secondary | ICD-10-CM | POA: Insufficient documentation

## 2018-04-24 DIAGNOSIS — R51 Headache: Secondary | ICD-10-CM | POA: Diagnosis not present

## 2018-04-24 LAB — CBC WITH DIFFERENTIAL/PLATELET
Abs Immature Granulocytes: 0.05 10*3/uL (ref 0.00–0.07)
Basophils Absolute: 0 10*3/uL (ref 0.0–0.1)
Basophils Relative: 0 %
Eosinophils Absolute: 0.1 10*3/uL (ref 0.0–0.5)
Eosinophils Relative: 2 %
HCT: 41.5 % (ref 39.0–52.0)
Hemoglobin: 13.9 g/dL (ref 13.0–17.0)
Immature Granulocytes: 1 %
Lymphocytes Relative: 20 %
Lymphs Abs: 1.5 10*3/uL (ref 0.7–4.0)
MCH: 31.9 pg (ref 26.0–34.0)
MCHC: 33.5 g/dL (ref 30.0–36.0)
MCV: 95.2 fL (ref 80.0–100.0)
Monocytes Absolute: 0.6 10*3/uL (ref 0.1–1.0)
Monocytes Relative: 8 %
Neutro Abs: 5.2 10*3/uL (ref 1.7–7.7)
Neutrophils Relative %: 69 %
Platelets: 141 10*3/uL — ABNORMAL LOW (ref 150–400)
RBC: 4.36 MIL/uL (ref 4.22–5.81)
RDW: 13.9 % (ref 11.5–15.5)
WBC: 7.5 10*3/uL (ref 4.0–10.5)
nRBC: 0 % (ref 0.0–0.2)

## 2018-04-24 LAB — URINALYSIS, ROUTINE W REFLEX MICROSCOPIC
Bilirubin Urine: NEGATIVE
Glucose, UA: NEGATIVE mg/dL
Hgb urine dipstick: NEGATIVE
Ketones, ur: 5 mg/dL — AB
Leukocytes,Ua: NEGATIVE
Nitrite: NEGATIVE
Protein, ur: NEGATIVE mg/dL
Specific Gravity, Urine: 1.014 (ref 1.005–1.030)
pH: 6 (ref 5.0–8.0)

## 2018-04-24 LAB — RAPID URINE DRUG SCREEN, HOSP PERFORMED
Amphetamines: NOT DETECTED
Barbiturates: NOT DETECTED
Benzodiazepines: NOT DETECTED
Cocaine: NOT DETECTED
Opiates: NOT DETECTED
Tetrahydrocannabinol: NOT DETECTED

## 2018-04-24 LAB — CBG MONITORING, ED: Glucose-Capillary: 113 mg/dL — ABNORMAL HIGH (ref 70–99)

## 2018-04-24 LAB — COMPREHENSIVE METABOLIC PANEL
ALT: 19 U/L (ref 0–44)
AST: 21 U/L (ref 15–41)
Albumin: 3.9 g/dL (ref 3.5–5.0)
Alkaline Phosphatase: 49 U/L (ref 38–126)
Anion gap: 8 (ref 5–15)
BILIRUBIN TOTAL: 1 mg/dL (ref 0.3–1.2)
BUN: 12 mg/dL (ref 8–23)
CALCIUM: 8.8 mg/dL — AB (ref 8.9–10.3)
CO2: 21 mmol/L — ABNORMAL LOW (ref 22–32)
Chloride: 107 mmol/L (ref 98–111)
Creatinine, Ser: 0.84 mg/dL (ref 0.61–1.24)
GFR calc Af Amer: >60 mL/min (ref >60)
Glucose, Bld: 129 mg/dL — ABNORMAL HIGH (ref 70–99)
Potassium: 3.6 mmol/L (ref 3.5–5.1)
Sodium: 136 mmol/L (ref 135–145)
Total Protein: 6.6 g/dL (ref 6.5–8.1)

## 2018-04-24 LAB — TROPONIN I
Troponin I: <0.03 ng/mL (ref <0.03)
Troponin I: <0.03 ng/mL (ref <0.03)

## 2018-04-24 MED ORDER — SODIUM CHLORIDE 0.9 % IV BOLUS
1000.0000 mL | Freq: Once | INTRAVENOUS | Status: AC
  Administered 2018-04-24: 1000 mL via INTRAVENOUS

## 2018-04-24 MED ORDER — PANTOPRAZOLE SODIUM 40 MG PO TBEC
40.0000 mg | DELAYED_RELEASE_TABLET | Freq: Every day | ORAL | Status: DC
  Administered 2018-04-25 – 2018-04-26 (×2): 40 mg via ORAL
  Filled 2018-04-24 (×2): qty 1

## 2018-04-24 MED ORDER — OMEGA-3-ACID ETHYL ESTERS 1 G PO CAPS
2.0000 | ORAL_CAPSULE | Freq: Two times a day (BID) | ORAL | Status: DC
  Administered 2018-04-24 – 2018-04-26 (×4): 2 g via ORAL
  Filled 2018-04-24 (×4): qty 2

## 2018-04-24 MED ORDER — APIXABAN 5 MG PO TABS
5.0000 mg | ORAL_TABLET | Freq: Two times a day (BID) | ORAL | Status: DC
  Administered 2018-04-24 – 2018-04-26 (×4): 5 mg via ORAL
  Filled 2018-04-24 (×4): qty 1

## 2018-04-24 MED ORDER — LACTATED RINGERS IV SOLN
INTRAVENOUS | Status: AC
  Administered 2018-04-24 – 2018-04-25 (×2): via INTRAVENOUS

## 2018-04-24 MED ORDER — ONDANSETRON HCL 4 MG/2ML IJ SOLN
4.0000 mg | Freq: Once | INTRAMUSCULAR | Status: AC
  Administered 2018-04-24: 4 mg via INTRAVENOUS
  Filled 2018-04-24: qty 2
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
## 2018-04-24 NOTE — ED Notes (Signed)
Pt ambulated in hallway with assistance. Pulse ox sat stayed above 97%.

## 2018-04-24 NOTE — Plan of Care (Signed)
  Problem: Clinical Measurements: Goal: Will remain free from infection Outcome: Progressing   Problem: Activity: Goal: Risk for activity intolerance will decrease Outcome: Progressing   Problem: Elimination: Goal: Will not experience complications related to bowel motility Outcome: Progressing   Problem: Safety: Goal: Ability to remain free from injury will improve Outcome: Progressing
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
## 2018-04-25 ENCOUNTER — Observation Stay (HOSPITAL_BASED_OUTPATIENT_CLINIC_OR_DEPARTMENT_OTHER): Payer: Medicare Other

## 2018-04-25 ENCOUNTER — Other Ambulatory Visit: Payer: Self-pay

## 2018-04-25 ENCOUNTER — Encounter (HOSPITAL_COMMUNITY): Payer: Self-pay | Admitting: General Practice

## 2018-04-25 ENCOUNTER — Observation Stay (HOSPITAL_COMMUNITY): Payer: Medicare Other

## 2018-04-25 DIAGNOSIS — D696 Thrombocytopenia, unspecified: Secondary | ICD-10-CM | POA: Diagnosis not present

## 2018-04-25 DIAGNOSIS — I340 Nonrheumatic mitral (valve) insufficiency: Secondary | ICD-10-CM | POA: Diagnosis not present

## 2018-04-25 DIAGNOSIS — I351 Nonrheumatic aortic (valve) insufficiency: Secondary | ICD-10-CM | POA: Diagnosis not present

## 2018-04-25 DIAGNOSIS — R55 Syncope and collapse: Secondary | ICD-10-CM | POA: Diagnosis not present

## 2018-04-25 DIAGNOSIS — R824 Acetonuria: Secondary | ICD-10-CM | POA: Diagnosis not present

## 2018-04-25 DIAGNOSIS — I480 Paroxysmal atrial fibrillation: Secondary | ICD-10-CM | POA: Diagnosis not present

## 2018-04-25 LAB — ECHOCARDIOGRAM COMPLETE
Height: 65 in
Weight: 3132.3 oz

## 2018-04-25 LAB — BASIC METABOLIC PANEL
Anion gap: 8 (ref 5–15)
BUN: 9 mg/dL (ref 8–23)
CO2: 23 mmol/L (ref 22–32)
Calcium: 8.6 mg/dL — ABNORMAL LOW (ref 8.9–10.3)
Chloride: 108 mmol/L (ref 98–111)
Creatinine, Ser: 0.76 mg/dL (ref 0.61–1.24)
GFR calc Af Amer: >60 mL/min (ref >60)
GFR calc non Af Amer: >60 mL/min (ref >60)
Glucose, Bld: 102 mg/dL — ABNORMAL HIGH (ref 70–99)
Potassium: 3.9 mmol/L (ref 3.5–5.1)
Sodium: 139 mmol/L (ref 135–145)

## 2018-04-25 LAB — CBC WITH DIFFERENTIAL/PLATELET
Abs Immature Granulocytes: 0.04 10*3/uL (ref 0.00–0.07)
Basophils Absolute: 0 10*3/uL (ref 0.0–0.1)
Basophils Relative: 0 %
EOS PCT: 1 %
Eosinophils Absolute: 0.1 10*3/uL (ref 0.0–0.5)
HCT: 38.7 % — ABNORMAL LOW (ref 39.0–52.0)
Hemoglobin: 13.3 g/dL (ref 13.0–17.0)
Immature Granulocytes: 1 %
LYMPHS ABS: 1.3 10*3/uL (ref 0.7–4.0)
Lymphocytes Relative: 16 %
MCH: 32.3 pg (ref 26.0–34.0)
MCHC: 34.4 g/dL (ref 30.0–36.0)
MCV: 93.9 fL (ref 80.0–100.0)
MONOS PCT: 8 %
Monocytes Absolute: 0.7 10*3/uL (ref 0.1–1.0)
NRBC: 0 % (ref 0.0–0.2)
Neutro Abs: 5.8 10*3/uL (ref 1.7–7.7)
Neutrophils Relative %: 74 %
Platelets: 141 10*3/uL — ABNORMAL LOW (ref 150–400)
RBC: 4.12 MIL/uL — ABNORMAL LOW (ref 4.22–5.81)
RDW: 13.7 % (ref 11.5–15.5)
WBC: 7.9 10*3/uL (ref 4.0–10.5)

## 2018-04-25 LAB — TROPONIN I: Troponin I: <0.03 ng/mL (ref <0.03)

## 2018-04-25 LAB — MAGNESIUM: Magnesium: 2 mg/dL (ref 1.7–2.4)

## 2018-04-25 MED ORDER — CARBOXYMETHYLCELLULOSE SODIUM 1 % OP GEL: 1.0000 [drp] | Freq: Every day | OPHTHALMIC | Status: DC

## 2018-04-25 MED ORDER — SOLIFENACIN SUCCINATE 10 MG PO TABS
10.0000 mg | ORAL_TABLET | Freq: Every day | ORAL | Status: DC
  Administered 2018-04-26: 10 mg via ORAL
  Filled 2018-04-25 (×2): qty 1

## 2018-04-25 MED ORDER — POLYVINYL ALCOHOL 1.4 % OP SOLN
1.0000 [drp] | Freq: Every day | OPHTHALMIC | Status: DC
  Filled 2018-04-25: qty 15

## 2018-04-25 NOTE — Progress Notes (Signed)
Bilateral carotid duplex completed. Preliminary results in Chart review CV Proc. [facility], [place] 04/25/2018, 9:02 AM

## 2018-04-25 NOTE — Progress Notes (Signed)
  Echocardiogram 2D Echocardiogram has been performed.  Frank [name] 04/25/2018, 8:39 AM

## 2018-04-25 NOTE — Progress Notes (Signed)
EEG Completed; Results Pending  

## 2018-04-25 NOTE — Progress Notes (Signed)
PROGRESS NOTE  [name]. ZOX:[id number] DOB: 29-Nov-1940 DOA: 04/24/2018 PCP: [name], MD  HPI/Recap of past 24 hours:  Denies chest pain, no syncope episode in the hospital Wife at bedside  Assessment/Plan: Active Problems:   Recurrent syncope   Urine ketone   Recurrently syncope: -reports poor oral intake, dehydration, ua+ ketone, orthostatic vital signs after hydration is equivoqal , clinically appear slightly dehydrated, getting ivfx24hrs, encourage oral intake -uds negative, carotid US no clinical significant stenosis (detail please refer to original report), echocardiogram lvef 55% , no wall motion abnormalities,  troponin negative x3, EEG unremarkable -cardiology consult for recurrent syncope, EP eval for possible loop recorder placement -will follow cardiology recommendation  PAF: continue epixaban, he is not on rate or rhythm control agent, currently sinus rhythm with sinus brady  Overreactive bladder: cardiology oked to resume vesicare.   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  lft unremarkable, Will check b12, folate.  F/u with pcp.   DVT prophylaxis: on eliquis  Consultants: cardiology /EP  Code Status: full   Family Communication:  Patient and wife at bedside    Disposition Plan: home with cardiology clearance, likely tomorrow on 2/26    Procedures:  EP to decide on loop recorder placement  Antibiotics:  none   Objective: BP 132/69 (BP Location: Right Arm)   Pulse (!) 52   Temp (!) 97.5 F (36.4 C) (Oral) Comment: RN notified  Resp 19   Ht 5\' 5"  (1.651 m)   Wt 88.8 kg   SpO2 98%   BMI 32.58 kg/m   Intake/Output Summary (Last 24 hours) at 04/25/2018 1514 Last data filed at 04/25/2018 1415 Gross per 24 hour  Intake 2547.32 ml  Output 1325 ml  Net 1222.32 ml   Filed Weights   04/24/18 1424 04/24/18 1944 04/25/18 0421  Weight: 91.7 kg 90.3 kg 88.8 kg    Exam: Patient is examined daily including today on  04/25/2018, exams remain the same as of yesterday except that has changed    General:  NAD, hard of hearing   Cardiovascular: RRR  Respiratory: CTABL  Abdomen: Soft/ND/NT, positive BS  Musculoskeletal: No Edema  Neuro: alert, oriented   Data Reviewed: Basic Metabolic Panel: Recent Labs  Lab 04/24/18 1453 04/25/18 0222  NA 136 139  K 3.6 3.9  CL 107 108  CO2 21* 23  GLUCOSE 129* 102*  BUN 12 9  CREATININE 0.84 0.76  CALCIUM 8.8* 8.6*  MG  --  2.0   Liver Function Tests: Recent Labs  Lab 04/24/18 1453  AST 21  ALT 19  ALKPHOS 49  BILITOT 1.0  PROT 6.6  ALBUMIN 3.9   No results for input(s): LIPASE, AMYLASE in the last 168 hours. No results for input(s): AMMONIA in the last 168 hours. CBC: Recent Labs  Lab 04/24/18 1453 04/25/18 0222  WBC 7.5 7.9  NEUTROABS 5.2 5.8  HGB 13.9 13.3  HCT 41.5 38.7*  MCV 95.2 93.9  PLT 141* 141*   Cardiac Enzymes:   Recent Labs  Lab 04/24/18 1453 04/24/18 2002 04/25/18 0222 04/25/18 0717  TROPONINI <0.03 <0.03 <0.03 <0.03   BNP (last 3 results) No results for input(s): BNP in the last 8760 hours.  ProBNP (last 3 results) No results for input(s): PROBNP in the last 8760 hours.  CBG: Recent Labs  Lab 04/24/18 1424  GLUCAP 113*    No results found for this or any previous visit (from the past 240 hour(s)).   Studies: Ct Head  Wo Contrast  Result Date: 04/24/2018 CLINICAL DATA:  Headache, syncope. EXAM: CT HEAD WITHOUT CONTRAST TECHNIQUE: Contiguous axial images were obtained from the base of the skull through the vertex without intravenous contrast. COMPARISON:  Brain MRI dated 09/14/2005. FINDINGS: Brain: Mild generalized age related parenchymal volume loss with commensurate dilatation of the sulci. Minimal chronic small vessel ischemic changes within the deep periventricular white matter regions. No mass, hemorrhage, edema or other evidence of acute parenchymal abnormality. No extra-axial hemorrhage. Vascular:  Chronic calcified atherosclerotic changes of the large vessels at the skull base. No unexpected hyperdense vessel. Skull: Normal. Negative for fracture or focal lesion. Sinuses/Orbits: No acute finding. Other: None. IMPRESSION: No acute findings. No intracranial mass, hemorrhage or edema. Electronically Signed   By: Franki Cabot M.D.   On: 04/24/2018 16:08   Dg Chest Portable 1 View  Result Date: 04/24/2018 CLINICAL DATA:  Generalized weakness EXAM: PORTABLE CHEST 1 VIEW COMPARISON:  04/15/2011 FINDINGS: Cardiac shadow is stable. The lungs are well aerated bilaterally. No focal infiltrate or sizable effusion is seen. No bony abnormality is noted. IMPRESSION: No acute abnormality seen. Electronically Signed   By: Inez Catalina M.D.   On: 04/24/2018 15:26   Vas US Carotid  Result Date: 04/25/2018 Carotid Arterial Duplex Study Indications: Syncope. Performing Technologist: Toma Copier RVS  Examination Guidelines: A complete evaluation includes B-mode imaging, spectral Doppler, color Doppler, and power Doppler as needed of all accessible portions of each vessel. Bilateral testing is considered an integral part of a complete examination. Limited examinations for reoccurring indications may be performed as noted.  Right Carotid Findings: +----------+--------+--------+--------+------------+--------------------------+           PSV cm/sEDV cm/sStenosisDescribe    Comments                   +----------+--------+--------+--------+------------+--------------------------+ CCA Prox  67      9                           mild intimal wall changes  +----------+--------+--------+--------+------------+--------------------------+ CCA Distal67      12                          mild intimal wall changes  +----------+--------+--------+--------+------------+--------------------------+ ICA Prox  56      10                                                      +----------+--------+--------+--------+------------+--------------------------+ ICA Mid   75      16                                                     +----------+--------+--------+--------+------------+--------------------------+ ICA Distal66      17                                                     +----------+--------+--------+--------+------------+--------------------------+ ECA       88      4  heterogenousminimal plaque at the                                                    origin                     +----------+--------+--------+--------+------------+--------------------------+ +----------+--------+-------+--------+-------------------+           PSV cm/sEDV cmsDescribeArm Pressure (mmHG) +----------+--------+-------+--------+-------------------+ [id number]                                         +----------+--------+-------+--------+-------------------+ +---------+--------+--+--------+-+ VertebralPSV cm/s50EDV cm/s6 +---------+--------+--+--------+-+  Left Carotid Findings: +----------+--------+--------+--------+---------------------+------------------+           PSV cm/sEDV cm/sStenosisDescribe             Comments           +----------+--------+--------+--------+---------------------+------------------+ CCA Prox  74      11                                   mild intimal wall                                                         changes            +----------+--------+--------+--------+---------------------+------------------+ CCA Distal70      11                                   mild wall changes  +----------+--------+--------+--------+---------------------+------------------+ ICA Prox  99      15              diffuse and          minimal plaque at                                    heterogenous         the origin          +----------+--------+--------+--------+---------------------+------------------+ ICA Mid   82      10                                                      +----------+--------+--------+--------+---------------------+------------------+ ICA Distal76      17                                                      +----------+--------+--------+--------+---------------------+------------------+ ECA       154     8               heterogenous  minimal plaque     +----------+--------+--------+--------+---------------------+------------------+ +----------+--------+--------+--------+-------------------+ SubclavianPSV cm/sEDV cm/sDescribeArm Pressure (mmHG) +----------+--------+--------+--------+-------------------+           159                                         +----------+--------+--------+--------+-------------------+ +---------+--------+--+--------+--+ VertebralPSV cm/s48EDV cm/s17 +---------+--------+--+--------+--+  Summary: Right Carotid: There is no evidence of stenosis in the right ICA. Left Carotid: Velocities in the left ICA are consistent with a 1-39% stenosis. Vertebrals:  Bilateral vertebral arteries demonstrate antegrade flow. Subclavians: Normal flow hemodynamics were seen in bilateral subclavian              arteries. *See table(s) above for measurements and observations.  Electronically signed by Antony Contras MD on 04/25/2018 at 3:03:22 PM.    Final     Scheduled Meds: . apixaban  5 mg Oral BID  . Carboxymethylcellulose Sodium  1 drop Both Eyes QHS  . omega-3 acid ethyl esters  2 capsule Oral BID  . pantoprazole  40 mg Oral Daily    Continuous Infusions: . lactated ringers 75 mL/hr at 04/25/18 1415     Time spent: 20mins I have personally reviewed and interpreted on  04/25/2018 daily labs, tele strips, imagings as discussed above under date review session and assessment and plans.  I reviewed all nursing notes, pharmacy notes, consultant  notes,  vitals, pertinent old records  I have discussed plan of care as described above with RN , patient and family on 04/25/2018   Florencia Reasons MD, PhD  [facility] Pager [phone]. If 7PM-7AM, please contact night-coverage at [email or web address], password [facility] 04/25/2018, 3:14 PM  LOS: 0 days

## 2018-04-25 NOTE — Progress Notes (Signed)
PT Cancellation Note  Patient Details Name: Tamarion Haymond. MRN: [id number] DOB: 1940/06/18   Cancelled Treatment:    Reason Eval/Treat Not Completed: Patient at procedure or test/unavailable. About to begin with EEG, estimated to take ~45 min. Will follow-up as schedule permits.  Mabeline Caras, PT, DPT Acute Rehabilitation Services  Pager [phone] Office [place] 04/25/2018, 10:41 AM
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
## 2018-04-25 NOTE — Discharge Instructions (Addendum)
Implant site/wound care instructions Keep incision clean and dry for 3 days. You can remove outer dressing tomorrow. Leave steri-strips (little pieces of tape) on until seen in the office for wound check appointment. Call the office [phone]) for redness, drainage, swelling, or fever.   Information on my medicine - ELIQUIS (apixaban)  Why was Eliquis prescribed for you? Eliquis was prescribed for you to reduce the risk of a blood clot forming that can cause a stroke if you have a medical condition called atrial fibrillation (a type of irregular heartbeat).  What do You need to know about Eliquis ? Take your Eliquis TWICE DAILY - one tablet in the morning and one tablet in the evening with or without food. If you have difficulty swallowing the tablet whole please discuss with your pharmacist how to take the medication safely.  Take Eliquis exactly as prescribed by your doctor and DO NOT stop taking Eliquis without talking to the doctor who prescribed the medication.  Stopping may increase your risk of developing a stroke.  Refill your prescription before you run out.  After discharge, you should have regular check-up appointments with your healthcare provider that is prescribing your Eliquis.  In the future your dose may need to be changed if your kidney function or weight changes by a significant amount or as you get older.  What do you do if you miss a dose? If you miss a dose, take it as soon as you remember on the same day and resume taking twice daily.  Do not take more than one dose of ELIQUIS at the same time to make up a missed dose.  Important Safety Information A possible side effect of Eliquis is bleeding. You should call your healthcare provider right away if you experience any of the following: ? Bleeding from an injury or your nose that does not stop. ? Unusual colored urine (red or dark brown) or unusual colored stools (red or black). ? Unusual bruising for unknown  reasons. ? A serious fall or if you hit your head (even if there is no bleeding).  Some medicines may interact with Eliquis and might increase your risk of bleeding or clotting while on Eliquis. To help avoid this, consult your healthcare provider or pharmacist prior to using any new prescription or non-prescription medications, including herbals, vitamins, non-steroidal anti-inflammatory drugs (NSAIDs) and supplements.  This website has more information on Eliquis (apixaban): http://www.eliquis.com/eliquis/home
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
## 2018-04-25 NOTE — Progress Notes (Signed)
PT Cancellation Note  Patient Details Name: [name] MRN: [id number] DOB: 08-03-1940   Cancelled Treatment:    Reason Eval/Treat Not Completed: Patient at procedure or test/unavailable (off floor for echo). Will follow-up for PT evaluation as schedule permits.  Mabeline Caras, PT, DPT Acute Rehabilitation Services  Pager [phone] Office [place] 04/25/2018, 8:23 AM
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
## 2018-04-25 NOTE — Plan of Care (Signed)
  Problem: Education: Goal: Knowledge of General Education information will improve Description Including pain rating scale, medication(s)/side effects and non-pharmacologic comfort measures Outcome: Adequate for Discharge   Problem: Health Behavior/Discharge Planning: Goal: Ability to manage health-related needs will improve Outcome: Adequate for Discharge   

## 2018-04-25 NOTE — Procedures (Signed)
ELECTROENCEPHALOGRAM REPORT   Patient: [name]       Room #: 3E26C EEG No. ID: [id number] Age: 78 y.o.        Sex: male Referring Physician: Erlinda Hong Report Date:  04/25/2018        Interpreting Physician: Alexis Goodell  History: [name] is an 78 y.o. male with recurrent syncope  Medications:  Protonix, Lovaza, Eliquis  Conditions of Recording:  This is a 21 channel routine scalp EEG performed with bipolar and monopolar montages arranged in accordance to the international 10/20 system of electrode placement. One channel was dedicated to EKG recording.  The patient is in the awake state.  Description:  The waking background activity consists of a low voltage, symmetrical, fairly well organized, 9 Hz alpha activity, seen from the parieto-occipital and posterior temporal regions.  Low voltage fast activity, poorly organized, is seen anteriorly and is at times superimposed on more posterior regions.  A mixture of theta and alpha rhythms are seen from the central and temporal regions. The patient does not drowse or sleep. No epileptiform activity is noted.   Hyperventilation was not performed.  Intermittent photic stimulation was performed but failed to illicit any change in the tracing.    IMPRESSION: This is a normal awake electroencephalogram with activation procedures. There are no focal lateralizing or epileptiform features.   Alexis Goodell, MD Neurology [phone] 04/25/2018, 12:56 PM

## 2018-04-26 ENCOUNTER — Encounter (HOSPITAL_COMMUNITY)
Admission: EM | Disposition: A | Discharge status: Home / Self Care | Payer: Self-pay | Source: Home / Self Care | Attending: Emergency Medicine

## 2018-04-26 ENCOUNTER — Encounter (HOSPITAL_COMMUNITY): Payer: Self-pay | Admitting: Internal Medicine

## 2018-04-26 DIAGNOSIS — R4020 Unspecified coma: Secondary | ICD-10-CM

## 2018-04-26 DIAGNOSIS — R55 Syncope and collapse: Secondary | ICD-10-CM | POA: Diagnosis not present

## 2018-04-26 HISTORY — PX: LOOP RECORDER INSERTION: EP1214

## 2018-04-26 LAB — CBC WITH DIFFERENTIAL/PLATELET
Abs Immature Granulocytes: 0.03 10*3/uL (ref 0.00–0.07)
Basophils Absolute: 0 10*3/uL (ref 0.0–0.1)
Basophils Relative: 0 %
Eosinophils Absolute: 0.1 10*3/uL (ref 0.0–0.5)
Eosinophils Relative: 2 %
HCT: 40.9 % (ref 39.0–52.0)
Hemoglobin: 13.9 g/dL (ref 13.0–17.0)
Immature Granulocytes: 1 %
Lymphocytes Relative: 19 %
Lymphs Abs: 1 10*3/uL (ref 0.7–4.0)
MCH: 31.8 pg (ref 26.0–34.0)
MCHC: 34 g/dL (ref 30.0–36.0)
MCV: 93.6 fL (ref 80.0–100.0)
Monocytes Absolute: 0.4 10*3/uL (ref 0.1–1.0)
Monocytes Relative: 8 %
Neutro Abs: 3.6 10*3/uL (ref 1.7–7.7)
Neutrophils Relative %: 70 %
Platelets: 135 10*3/uL — ABNORMAL LOW (ref 150–400)
RBC: 4.37 MIL/uL (ref 4.22–5.81)
RDW: 13.7 % (ref 11.5–15.5)
WBC: 5.1 10*3/uL (ref 4.0–10.5)
nRBC: 0 % (ref 0.0–0.2)

## 2018-04-26 LAB — BASIC METABOLIC PANEL
ANION GAP: 8 (ref 5–15)
BUN: 10 mg/dL (ref 8–23)
CO2: 25 mmol/L (ref 22–32)
Calcium: 8.8 mg/dL — ABNORMAL LOW (ref 8.9–10.3)
Chloride: 109 mmol/L (ref 98–111)
Creatinine, Ser: 0.78 mg/dL (ref 0.61–1.24)
GFR calc Af Amer: >60 mL/min (ref >60)
GFR calc non Af Amer: >60 mL/min (ref >60)
Glucose, Bld: 98 mg/dL (ref 70–99)
Potassium: 3.5 mmol/L (ref 3.5–5.1)
Sodium: 142 mmol/L (ref 135–145)

## 2018-04-26 LAB — VITAMIN B12: Vitamin B-12: 241 pg/mL (ref 180–914)

## 2018-04-26 LAB — FOLATE: FOLATE: 12.6 ng/mL (ref >5.9)

## 2018-04-26 SURGERY — LOOP RECORDER INSERTION
Anesthesia: LOCAL

## 2018-04-26 MED ORDER — LIDOCAINE-EPINEPHRINE 1 %-1:100000 IJ SOLN
INTRAMUSCULAR | Status: DC | PRN
  Administered 2018-04-26: 10 mL

## 2018-04-26 MED ORDER — LIDOCAINE-EPINEPHRINE 1 %-1:100000 IJ SOLN
INTRAMUSCULAR | Status: AC
  Filled 2018-04-26: qty 1

## 2018-04-26 SURGICAL SUPPLY — 2 items
LOOP REVEAL LINQSYS (Prosthesis & Implant Heart) ×1 IMPLANT
PACK LOOP INSERTION (CUSTOM PROCEDURE TRAY) ×2 IMPLANT

## 2018-04-26 NOTE — Progress Notes (Signed)
No arrhythmias overnight  Loop recorder now in place and patient educated  No driving x 6 months  OK to discharge to home  Will need wound check in 10 days Follow-up with Dr Acie Fredrickson  I will follow remotely going forward and will see as needed  Thompson Grayer MD, [facility] 04/26/2018 8:57 AM
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
## 2018-04-26 NOTE — Progress Notes (Signed)
Pt. With HR down to 30-40s while asleep. On call for [facility] paged to make aware. No distress noted. RN will continue to monitor.
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
## 2018-04-26 NOTE — Plan of Care (Signed)
  Problem: Activity: Goal: Risk for activity intolerance will decrease Outcome: Progressing   Problem: Safety: Goal: Ability to remain free from injury will improve Outcome: Progressing   

## 2018-04-26 NOTE — Interval H&P Note (Signed)
History and Physical Interval Note:  04/26/2018 8:17 AM  [name]  has presented today for surgery, with the diagnosis of Syncope  The various methods of treatment have been discussed with the patient and family. After consideration of risks, benefits and other options for treatment, the patient has consented to  Procedure(s): LOOP RECORDER INSERTION (N/A) as a surgical intervention .  The patient's history has been reviewed, patient examined, no change in status, stable for surgery.  I have reviewed the patient's chart and labs.  Questions were answered to the patient's satisfaction.     Thompson Grayer

## 2018-04-27 ENCOUNTER — Telehealth: Payer: Self-pay | Admitting: *Deleted

## 2018-04-27 NOTE — Telephone Encounter (Signed)
Transition Care Management Follow-up Telephone Call   Date discharged? 04/26/18   How have you been since you were released from the hospital? Spoke w/wife she states husband is doing alright   Do you understand why you were in the hospital? YES   Do you understand the discharge instructions? YES   Where were you discharged to? Home   Items Reviewed:  Medications reviewed: YES  Allergies reviewed: YES  Dietary changes reviewed: YES  Referrals reviewed:No referral recommended   Functional Questionnaire:   Activities of Daily Living (ADLs):   She states he are independent in the following: bathing and hygiene, feeding, continence, grooming, toileting and dressing States he require assistance with the following: ambulation at times   Any transportation issues/concerns?: NO   Any patient concerns? NO   Confirmed importance and date/time of follow-up visits scheduled YES, appt 05/01/18  Provider Appointment booked with [name]  Confirmed with patient if condition begins to worsen call PCP or go to the ER.  Patient was given the office number and encouraged to call back with question or concerns.  : YES

## 2018-04-27 NOTE — Telephone Encounter (Signed)
Pt was on TCM report D/C 04/26/18 from hosp called pt to complete TCM call & verify appt that has been made for 05/01/18. There was no answer LMOM RTC. CRM created...Johny Chess

## 2018-04-28 ENCOUNTER — Other Ambulatory Visit: Payer: Self-pay | Admitting: Internal Medicine

## 2018-04-28 ENCOUNTER — Telehealth: Payer: Self-pay | Admitting: Cardiovascular Disease

## 2018-04-28 ENCOUNTER — Other Ambulatory Visit: Payer: Self-pay | Admitting: Cardiovascular Disease

## 2018-04-28 DIAGNOSIS — E781 Pure hyperglyceridemia: Secondary | ICD-10-CM

## 2018-04-28 NOTE — Telephone Encounter (Signed)
New Message:    Patient wife concerning patient device that he just got put in. Patient wife would like to speak with some one concering his device.

## 2018-04-28 NOTE — Telephone Encounter (Signed)
Spoke with patient's wife. Advised that patient's home monitor is up to date as of today and she does not need to do anything else at this time. She verbalizes understanding.

## 2018-05-01 ENCOUNTER — Encounter: Payer: Self-pay | Admitting: Internal Medicine

## 2018-05-01 ENCOUNTER — Ambulatory Visit (INDEPENDENT_AMBULATORY_CARE_PROVIDER_SITE_OTHER): Payer: Medicare Other | Admitting: Internal Medicine

## 2018-05-01 VITALS — BP 144/78 | HR 65 | Temp 97.5°F | Resp 16 | Ht 65.0 in | Wt 202.5 lb

## 2018-05-01 DIAGNOSIS — R55 Syncope and collapse: Secondary | ICD-10-CM | POA: Diagnosis not present

## 2018-05-01 DIAGNOSIS — I10 Essential (primary) hypertension: Secondary | ICD-10-CM

## 2018-05-01 DIAGNOSIS — R001 Bradycardia, unspecified: Secondary | ICD-10-CM | POA: Diagnosis not present

## 2018-05-01 DIAGNOSIS — I480 Paroxysmal atrial fibrillation: Secondary | ICD-10-CM | POA: Diagnosis not present

## 2018-05-01 HISTORY — DX: Essential (primary) hypertension: I10

## 2018-05-01 MED ORDER — SOLIFENACIN SUCCINATE 10 MG PO TABS: 10.0000 mg | ORAL_TABLET | Freq: Every day | ORAL | 1 refills | Status: DC

## 2018-05-01 NOTE — Patient Instructions (Signed)

## 2018-05-01 NOTE — Progress Notes (Signed)
Subjective:  Patient ID: Frank Hoover., male    DOB: Apr 01, 1940  Age: 78 y.o. MRN: [id number]  CC: Hypertension and Atrial Fibrillation   HPI Frank Schara. presents for f/up -- He was recently admitted for a syncopal episode.  He stayed in the hospital for 3 days and no dysrhythmias or causes were identified.  He said he has had this happen a couple of times when he missed a meal and felt like he was hypoglycemic.  Since discharge, he has been doing well.  He denies any recurrent episodes of syncope and he denies dizziness or lightheadedness.    Discharge Diagnoses:  Principal Problem:   Recurrent syncope Active Problems:   PAF (paroxysmal atrial fibrillation) (HCC)   Loss of consciousness ([place])   Hyperlipidemia with target LDL less than 70   OAB (overactive bladder)  Outpatient Medications Prior to Visit  Medication Sig Dispense Refill  . atorvastatin (LIPITOR) 20 MG tablet Take 1 tablet (20 mg total) by mouth daily. 90 tablet 1  . ELIQUIS 5 MG TABS tablet Take 1 tablet by mouth twice daily 60 tablet 0  . esomeprazole (NEXIUM) 40 MG capsule Take 1 capsule (40 mg total) by mouth daily. (Patient taking differently: Take 40 mg by mouth daily before breakfast. ) 30 capsule 11  . omega-3 acid ethyl esters (LOVAZA) 1 g capsule Take 2 capsules by mouth twice daily 360 capsule 0  . Carboxymethylcellulose Sodium (REFRESH LIQUIGEL) 1 % GEL Place 1 drop into both eyes at bedtime.    . Naproxen Sodium 220 MG CAPS Take 550 mg by mouth daily as needed (headaches). 2 1/2 tablets - 550 mg    . OVER THE COUNTER MEDICATION Take 1 lozenge by mouth 3 (three) times daily as needed (sinus drainage). Sugar free cough drops    . Polyvinyl Alcohol-Povidone (REFRESH OP) Place 1 drop into both eyes daily.    . solifenacin (VESICARE) 10 MG tablet Take 1 tablet (10 mg total) by mouth daily. 90 tablet 1   No facility-administered medications prior to visit.     ROS Review of Systems    Constitutional: Negative.  Negative for diaphoresis and fatigue.  HENT: Negative.   Eyes: Negative for visual disturbance.  Respiratory: Negative for cough, chest tightness, shortness of breath and wheezing.   Cardiovascular: Negative for chest pain, palpitations and leg swelling.  Gastrointestinal: Negative for abdominal pain and nausea.  Genitourinary: Negative.   Musculoskeletal: Negative for arthralgias and myalgias.  Skin: Negative.   Neurological: Negative for dizziness, weakness, light-headedness and numbness.  Hematological: Negative for adenopathy. Does not bruise/bleed easily.  Psychiatric/Behavioral: Negative.     Objective:  BP (!) 144/78 (BP Location: Left Arm, Patient Position: Sitting, Cuff Size: Normal)   Pulse 65   Temp (!) 97.5 F (36.4 C) (Oral)   Resp 16   Ht 5\' 5"  (1.651 m)   Wt 202 lb 8 oz (91.9 kg)   SpO2 97%   BMI 33.70 kg/m   BP Readings from Last 3 Encounters:  05/01/18 (!) 144/78  04/26/18 (!) 138/59  04/10/18 126/72    Wt Readings from Last 3 Encounters:  05/01/18 202 lb 8 oz (91.9 kg)  04/26/18 196 lb 6.4 oz (89.1 kg)  04/10/18 202 lb 1.9 oz (91.7 kg)    Physical Exam Vitals signs reviewed.  Constitutional:      Appearance: He is obese. He is not ill-appearing or diaphoretic.  HENT:     Nose: Nose normal. No  congestion.     Mouth/Throat:     Mouth: Mucous membranes are moist.     Pharynx: Oropharynx is clear. No oropharyngeal exudate.  Eyes:     General: No scleral icterus.    Conjunctiva/sclera: Conjunctivae normal.  Neck:     Musculoskeletal: Normal range of motion and neck supple. No neck rigidity or muscular tenderness.  Cardiovascular:     Rate and Rhythm: Normal rate and regular rhythm.     Heart sounds: No murmur. No gallop.   Pulmonary:     Effort: Pulmonary effort is normal.     Breath sounds: No stridor. No wheezing or rhonchi.  Abdominal:     General: Abdomen is flat.     Palpations: There is no hepatomegaly,  splenomegaly or mass.     Tenderness: There is no abdominal tenderness.  Musculoskeletal: Normal range of motion.        General: No swelling.     Right lower leg: No edema.     Left lower leg: No edema.  Skin:    General: Skin is warm and dry.  Neurological:     General: No focal deficit present.     Mental Status: He is oriented to person, place, and time. Mental status is at baseline.     Lab Results  Component Value Date   WBC 5.1 04/26/2018   HGB 13.9 04/26/2018   HCT 40.9 04/26/2018   PLT 135 (L) 04/26/2018   GLUCOSE 98 04/26/2018   CHOL 159 10/12/2017   TRIG 183 (H) 10/12/2017   HDL 27 (L) 10/12/2017   LDLDIRECT 91.0 04/23/2015   LDLCALC 95 10/12/2017   ALT 19 04/24/2018   AST 21 04/24/2018   NA 142 04/26/2018   K 3.5 04/26/2018   CL 109 04/26/2018   CREATININE 0.78 04/26/2018   BUN 10 04/26/2018   CO2 25 04/26/2018   TSH 2.01 01/11/2018   PSA 0.18 01/11/2018   INR 1.00 04/15/2011   HGBA1C 5.5 11/28/2012    Ct Head Wo Contrast  Result Date: 04/24/2018 CLINICAL DATA:  Headache, syncope. EXAM: CT HEAD WITHOUT CONTRAST TECHNIQUE: Contiguous axial images were obtained from the base of the skull through the vertex without intravenous contrast. COMPARISON:  Brain MRI dated 09/14/2005. FINDINGS: Brain: Mild generalized age related parenchymal volume loss with commensurate dilatation of the sulci. Minimal chronic small vessel ischemic changes within the deep periventricular white matter regions. No mass, hemorrhage, edema or other evidence of acute parenchymal abnormality. No extra-axial hemorrhage. Vascular: Chronic calcified atherosclerotic changes of the large vessels at the skull base. No unexpected hyperdense vessel. Skull: Normal. Negative for fracture or focal lesion. Sinuses/Orbits: No acute finding. Other: None. IMPRESSION: No acute findings. No intracranial mass, hemorrhage or edema. Electronically Signed   By: Franki Cabot M.D.   On: 04/24/2018 16:08   Dg Chest  Portable 1 View  Result Date: 04/24/2018 CLINICAL DATA:  Generalized weakness EXAM: PORTABLE CHEST 1 VIEW COMPARISON:  04/15/2011 FINDINGS: Cardiac shadow is stable. The lungs are well aerated bilaterally. No focal infiltrate or sizable effusion is seen. No bony abnormality is noted. IMPRESSION: No acute abnormality seen. Electronically Signed   By: Inez Catalina M.D.   On: 04/24/2018 15:26   Vas US Carotid  Result Date: 04/25/2018 Carotid Arterial Duplex Study Indications: Syncope. Performing Technologist: Toma Copier RVS  Examination Guidelines: A complete evaluation includes B-mode imaging, spectral Doppler, color Doppler, and power Doppler as needed of all accessible portions of each vessel. Bilateral testing is considered  an integral part of a complete examination. Limited examinations for reoccurring indications may be performed as noted.  Right Carotid Findings: +----------+--------+--------+--------+------------+--------------------------+           PSV cm/sEDV cm/sStenosisDescribe    Comments                   +----------+--------+--------+--------+------------+--------------------------+ CCA Prox  67      9                           mild intimal wall changes  +----------+--------+--------+--------+------------+--------------------------+ CCA Distal67      12                          mild intimal wall changes  +----------+--------+--------+--------+------------+--------------------------+ ICA Prox  56      10                                                     +----------+--------+--------+--------+------------+--------------------------+ ICA Mid   75      16                                                     +----------+--------+--------+--------+------------+--------------------------+ ICA Distal66      17                                                     +----------+--------+--------+--------+------------+--------------------------+ ECA       88       4               heterogenousminimal plaque at the                                                    origin                     +----------+--------+--------+--------+------------+--------------------------+ +----------+--------+-------+--------+-------------------+           PSV cm/sEDV cmsDescribeArm Pressure (mmHG) +----------+--------+-------+--------+-------------------+ [id number]                                         +----------+--------+-------+--------+-------------------+ +---------+--------+--+--------+-+ VertebralPSV cm/s50EDV cm/s6 +---------+--------+--+--------+-+  Left Carotid Findings: +----------+--------+--------+--------+---------------------+------------------+           PSV cm/sEDV cm/sStenosisDescribe             Comments           +----------+--------+--------+--------+---------------------+------------------+ CCA Prox  74      11                                   mild intimal wall  changes            +----------+--------+--------+--------+---------------------+------------------+ CCA Distal70      11                                   mild wall changes  +----------+--------+--------+--------+---------------------+------------------+ ICA Prox  99      15              diffuse and          minimal plaque at                                    heterogenous         the origin         +----------+--------+--------+--------+---------------------+------------------+ ICA Mid   82      10                                                      +----------+--------+--------+--------+---------------------+------------------+ ICA Distal76      17                                                      +----------+--------+--------+--------+---------------------+------------------+ ECA       154     8               heterogenous         minimal plaque      +----------+--------+--------+--------+---------------------+------------------+ +----------+--------+--------+--------+-------------------+ SubclavianPSV cm/sEDV cm/sDescribeArm Pressure (mmHG) +----------+--------+--------+--------+-------------------+           159                                         +----------+--------+--------+--------+-------------------+ +---------+--------+--+--------+--+ VertebralPSV cm/s48EDV cm/s17 +---------+--------+--+--------+--+  Summary: Right Carotid: There is no evidence of stenosis in the right ICA. Left Carotid: Velocities in the left ICA are consistent with a 1-39% stenosis. Vertebrals:  Bilateral vertebral arteries demonstrate antegrade flow. Subclavians: Normal flow hemodynamics were seen in bilateral subclavian              arteries. *See table(s) above for measurements and observations.  Electronically signed by Antony Contras MD on 04/25/2018 at 3:03:22 PM.    Final     Assessment & Plan:   Frank [name] was seen today for hypertension and atrial fibrillation.  Diagnoses and all orders for this visit:  PAF (paroxysmal atrial fibrillation) ([place])- He is maintaining sinus rhythm.  Will continue anticoagulation with Eliquis.  Recurrent syncope- A Loop recorder is in place.  I await to see if a dysrhythmia explains his symptoms.  Bradycardia- As above.  Essential hypertension- His blood pressure is not adequately well controlled but he is also not willing to start taking an antihypertensive.  Other orders -     solifenacin (VESICARE) 10 MG tablet; Take 1 tablet (10 mg total) by mouth daily.   I have discontinued Frank Moll Jr.'s Naproxen Sodium, Carboxymethylcellulose Sodium, Polyvinyl Alcohol-Povidone (REFRESH OP), and OVER THE COUNTER  MEDICATION. I am also having him maintain his atorvastatin, esomeprazole, omega-3 acid ethyl esters, ELIQUIS, and solifenacin.  Meds ordered this encounter  Medications  . solifenacin (VESICARE) 10 MG  tablet    Sig: Take 1 tablet (10 mg total) by mouth daily.    Dispense:  90 tablet    Refill:  1    Send in generic     Follow-up: Return in about 4 months (around 08/31/2018).  Scarlette Calico, MD

## 2018-05-09 ENCOUNTER — Ambulatory Visit (INDEPENDENT_AMBULATORY_CARE_PROVIDER_SITE_OTHER): Payer: Medicare Other | Admitting: Nurse Practitioner

## 2018-05-09 DIAGNOSIS — R55 Syncope and collapse: Secondary | ICD-10-CM

## 2018-05-09 LAB — CUP PACEART INCLINIC DEVICE CHECK
Date Time Interrogation Session: 20200310090738
Implantable Pulse Generator Implant Date: 20200226

## 2018-05-09 NOTE — Progress Notes (Signed)
ILR wound check in clinic. Wound well healed. No episodes. Questions answered. Monitor transmitting. Follow up remotely

## 2018-05-29 ENCOUNTER — Other Ambulatory Visit: Payer: Self-pay | Admitting: Cardiovascular Disease

## 2018-05-29 ENCOUNTER — Other Ambulatory Visit: Payer: Self-pay

## 2018-05-29 ENCOUNTER — Ambulatory Visit (INDEPENDENT_AMBULATORY_CARE_PROVIDER_SITE_OTHER): Payer: Medicare Other | Admitting: *Deleted

## 2018-05-29 DIAGNOSIS — R55 Syncope and collapse: Secondary | ICD-10-CM

## 2018-05-29 DIAGNOSIS — M7541 Impingement syndrome of right shoulder: Secondary | ICD-10-CM | POA: Diagnosis not present

## 2018-05-29 DIAGNOSIS — M25511 Pain in right shoulder: Secondary | ICD-10-CM | POA: Diagnosis not present

## 2018-05-29 NOTE — Telephone Encounter (Signed)
Age 78, weight 92kg, SCr 0.78 from 04/26/18, last OV 05/09/18.

## 2018-05-30 LAB — CUP PACEART REMOTE DEVICE CHECK
Date Time Interrogation Session: 20200330144103
Implantable Pulse Generator Implant Date: 20200226

## 2018-06-07 NOTE — Progress Notes (Signed)
[facility] Summary Report / Loop Recorder 

## 2018-06-08 DIAGNOSIS — M25511 Pain in right shoulder: Secondary | ICD-10-CM | POA: Insufficient documentation

## 2018-06-12 DIAGNOSIS — M5412 Radiculopathy, cervical region: Secondary | ICD-10-CM | POA: Diagnosis not present

## 2018-06-15 ENCOUNTER — Telehealth: Payer: Self-pay

## 2018-06-15 NOTE — Telephone Encounter (Signed)
Pt states that he lost his symptom activator. He would like for [place] to mail him another one. I did verify his mailing address and it is correct in the system.

## 2018-06-16 DIAGNOSIS — M5412 Radiculopathy, cervical region: Secondary | ICD-10-CM | POA: Insufficient documentation

## 2018-06-16 HISTORY — DX: Radiculopathy, cervical region: M54.12

## 2018-06-22 DIAGNOSIS — M5412 Radiculopathy, cervical region: Secondary | ICD-10-CM | POA: Diagnosis not present

## 2018-06-23 ENCOUNTER — Telehealth: Payer: Self-pay | Admitting: Cardiovascular Disease

## 2018-06-23 ENCOUNTER — Inpatient Hospital Stay (HOSPITAL_COMMUNITY)
Admission: AD | Admit: 2018-06-23 | Discharge: 2018-06-29 | DRG: 948 | Disposition: A | Discharge status: Home Health | Payer: Medicare Other | Source: Other Acute Inpatient Hospital | Attending: Internal Medicine | Admitting: Internal Medicine

## 2018-06-23 ENCOUNTER — Encounter (HOSPITAL_COMMUNITY): Payer: Self-pay | Admitting: Family Medicine

## 2018-06-23 DIAGNOSIS — I714 Abdominal aortic aneurysm, without rupture, unspecified: Secondary | ICD-10-CM

## 2018-06-23 DIAGNOSIS — I495 Sick sinus syndrome: Secondary | ICD-10-CM | POA: Diagnosis not present

## 2018-06-23 DIAGNOSIS — R680 Hypothermia, not associated with low environmental temperature: Principal | ICD-10-CM | POA: Diagnosis present

## 2018-06-23 DIAGNOSIS — E781 Pure hyperglyceridemia: Secondary | ICD-10-CM | POA: Diagnosis present

## 2018-06-23 DIAGNOSIS — M4855XA Collapsed vertebra, not elsewhere classified, thoracolumbar region, initial encounter for fracture: Secondary | ICD-10-CM | POA: Diagnosis not present

## 2018-06-23 DIAGNOSIS — I4891 Unspecified atrial fibrillation: Secondary | ICD-10-CM | POA: Diagnosis not present

## 2018-06-23 DIAGNOSIS — I480 Paroxysmal atrial fibrillation: Secondary | ICD-10-CM | POA: Diagnosis present

## 2018-06-23 DIAGNOSIS — M8448XA Pathological fracture, other site, initial encounter for fracture: Secondary | ICD-10-CM | POA: Diagnosis present

## 2018-06-23 DIAGNOSIS — E278 Other specified disorders of adrenal gland: Secondary | ICD-10-CM | POA: Diagnosis not present

## 2018-06-23 DIAGNOSIS — E279 Disorder of adrenal gland, unspecified: Secondary | ICD-10-CM | POA: Diagnosis not present

## 2018-06-23 DIAGNOSIS — E872 Acidosis: Secondary | ICD-10-CM | POA: Diagnosis not present

## 2018-06-23 DIAGNOSIS — M546 Pain in thoracic spine: Secondary | ICD-10-CM | POA: Diagnosis not present

## 2018-06-23 DIAGNOSIS — D72829 Elevated white blood cell count, unspecified: Secondary | ICD-10-CM | POA: Diagnosis not present

## 2018-06-23 DIAGNOSIS — K439 Ventral hernia without obstruction or gangrene: Secondary | ICD-10-CM | POA: Diagnosis not present

## 2018-06-23 DIAGNOSIS — R55 Syncope and collapse: Secondary | ICD-10-CM | POA: Diagnosis not present

## 2018-06-23 DIAGNOSIS — M4854XA Collapsed vertebra, not elsewhere classified, thoracic region, initial encounter for fracture: Secondary | ICD-10-CM | POA: Diagnosis present

## 2018-06-23 DIAGNOSIS — S32010A Wedge compression fracture of first lumbar vertebra, initial encounter for closed fracture: Secondary | ICD-10-CM | POA: Diagnosis not present

## 2018-06-23 DIAGNOSIS — Z79899 Other long term (current) drug therapy: Secondary | ICD-10-CM

## 2018-06-23 DIAGNOSIS — R911 Solitary pulmonary nodule: Secondary | ICD-10-CM | POA: Diagnosis present

## 2018-06-23 DIAGNOSIS — C642 Malignant neoplasm of left kidney, except renal pelvis: Secondary | ICD-10-CM | POA: Diagnosis not present

## 2018-06-23 DIAGNOSIS — M545 Low back pain: Secondary | ICD-10-CM | POA: Diagnosis present

## 2018-06-23 DIAGNOSIS — Z87891 Personal history of nicotine dependence: Secondary | ICD-10-CM

## 2018-06-23 DIAGNOSIS — M4856XA Collapsed vertebra, not elsewhere classified, lumbar region, initial encounter for fracture: Secondary | ICD-10-CM | POA: Diagnosis not present

## 2018-06-23 DIAGNOSIS — Z825 Family history of asthma and other chronic lower respiratory diseases: Secondary | ICD-10-CM

## 2018-06-23 DIAGNOSIS — R000 Tachycardia, unspecified: Secondary | ICD-10-CM | POA: Diagnosis not present

## 2018-06-23 DIAGNOSIS — R0602 Shortness of breath: Secondary | ICD-10-CM | POA: Diagnosis not present

## 2018-06-23 DIAGNOSIS — R059 Cough, unspecified: Secondary | ICD-10-CM

## 2018-06-23 DIAGNOSIS — J984 Other disorders of lung: Secondary | ICD-10-CM | POA: Diagnosis not present

## 2018-06-23 DIAGNOSIS — K219 Gastro-esophageal reflux disease without esophagitis: Secondary | ICD-10-CM | POA: Diagnosis present

## 2018-06-23 DIAGNOSIS — S22088A Other fracture of T11-T12 vertebra, initial encounter for closed fracture: Secondary | ICD-10-CM | POA: Diagnosis not present

## 2018-06-23 DIAGNOSIS — J432 Centrilobular emphysema: Secondary | ICD-10-CM | POA: Diagnosis not present

## 2018-06-23 DIAGNOSIS — R6510 Systemic inflammatory response syndrome (SIRS) of non-infectious origin without acute organ dysfunction: Secondary | ICD-10-CM | POA: Diagnosis present

## 2018-06-23 DIAGNOSIS — Z9181 History of falling: Secondary | ICD-10-CM | POA: Diagnosis not present

## 2018-06-23 DIAGNOSIS — Z7901 Long term (current) use of anticoagulants: Secondary | ICD-10-CM | POA: Diagnosis not present

## 2018-06-23 DIAGNOSIS — T148XXA Other injury of unspecified body region, initial encounter: Secondary | ICD-10-CM | POA: Diagnosis not present

## 2018-06-23 DIAGNOSIS — Z95818 Presence of other cardiac implants and grafts: Secondary | ICD-10-CM | POA: Diagnosis not present

## 2018-06-23 DIAGNOSIS — R05 Cough: Secondary | ICD-10-CM

## 2018-06-23 DIAGNOSIS — M549 Dorsalgia, unspecified: Secondary | ICD-10-CM | POA: Diagnosis not present

## 2018-06-23 DIAGNOSIS — D4102 Neoplasm of uncertain behavior of left kidney: Secondary | ICD-10-CM | POA: Diagnosis not present

## 2018-06-23 DIAGNOSIS — G319 Degenerative disease of nervous system, unspecified: Secondary | ICD-10-CM | POA: Diagnosis not present

## 2018-06-23 DIAGNOSIS — Z885 Allergy status to narcotic agent status: Secondary | ICD-10-CM

## 2018-06-23 DIAGNOSIS — M5459 Other low back pain: Secondary | ICD-10-CM | POA: Diagnosis present

## 2018-06-23 DIAGNOSIS — R110 Nausea: Secondary | ICD-10-CM | POA: Diagnosis not present

## 2018-06-23 DIAGNOSIS — E669 Obesity, unspecified: Secondary | ICD-10-CM | POA: Diagnosis present

## 2018-06-23 DIAGNOSIS — N2889 Other specified disorders of kidney and ureter: Secondary | ICD-10-CM | POA: Diagnosis present

## 2018-06-23 DIAGNOSIS — Z6830 Body mass index (BMI) 30.0-30.9, adult: Secondary | ICD-10-CM

## 2018-06-23 DIAGNOSIS — E785 Hyperlipidemia, unspecified: Secondary | ICD-10-CM | POA: Diagnosis not present

## 2018-06-23 DIAGNOSIS — I10 Essential (primary) hypertension: Secondary | ICD-10-CM | POA: Diagnosis not present

## 2018-06-23 DIAGNOSIS — S32018A Other fracture of first lumbar vertebra, initial encounter for closed fracture: Secondary | ICD-10-CM | POA: Diagnosis not present

## 2018-06-23 DIAGNOSIS — S22080A Wedge compression fracture of T11-T12 vertebra, initial encounter for closed fracture: Secondary | ICD-10-CM | POA: Diagnosis not present

## 2018-06-23 DIAGNOSIS — M62830 Muscle spasm of back: Secondary | ICD-10-CM | POA: Diagnosis not present

## 2018-06-23 DIAGNOSIS — M8458XA Pathological fracture in neoplastic disease, other specified site, initial encounter for fracture: Secondary | ICD-10-CM | POA: Diagnosis not present

## 2018-06-23 DIAGNOSIS — A419 Sepsis, unspecified organism: Secondary | ICD-10-CM

## 2018-06-23 HISTORY — DX: Solitary pulmonary nodule: R91.1

## 2018-06-23 HISTORY — DX: Abdominal aortic aneurysm, without rupture, unspecified: I71.40

## 2018-06-23 MED ORDER — ONDANSETRON HCL 4 MG/2ML IJ SOLN
4.0000 mg | Freq: Four times a day (QID) | INTRAMUSCULAR | Status: DC | PRN
  Administered 2018-06-24: 20:00:00 4 mg via INTRAVENOUS
  Filled 2018-06-23: qty 2

## 2018-06-23 MED ORDER — MORPHINE SULFATE (PF) 4 MG/ML IV SOLN: 4.0000 mg | INTRAVENOUS | Status: DC | PRN

## 2018-06-23 MED ORDER — POLYETHYLENE GLYCOL 3350 17 G PO PACK
17.0000 g | PACK | Freq: Every day | ORAL | Status: DC | PRN
  Administered 2018-06-27 – 2018-06-28 (×2): 17 g via ORAL
  Filled 2018-06-23 (×2): qty 1

## 2018-06-23 MED ORDER — MORPHINE SULFATE (PF) 4 MG/ML IV SOLN
3.0000 mg | INTRAVENOUS | Status: DC | PRN
  Administered 2018-06-24 – 2018-06-26 (×3): 3 mg via INTRAVENOUS
  Filled 2018-06-23 (×3): qty 1

## 2018-06-23 MED ORDER — DARIFENACIN HYDROBROMIDE ER 15 MG PO TB24
15.0000 mg | ORAL_TABLET | Freq: Every day | ORAL | Status: DC
  Administered 2018-06-24 – 2018-06-29 (×6): 15 mg via ORAL
  Filled 2018-06-23 (×6): qty 1

## 2018-06-23 MED ORDER — APIXABAN 5 MG PO TABS: 5.0000 mg | ORAL_TABLET | Freq: Two times a day (BID) | ORAL | Status: DC

## 2018-06-23 MED ORDER — SODIUM CHLORIDE 0.9 % IV SOLN: 250.0000 mL | INTRAVENOUS | Status: DC | PRN

## 2018-06-23 MED ORDER — ACETAMINOPHEN 325 MG PO TABS
650.0000 mg | ORAL_TABLET | Freq: Four times a day (QID) | ORAL | Status: DC | PRN
  Administered 2018-06-24: 20:00:00 650 mg via ORAL
  Filled 2018-06-23: qty 2

## 2018-06-23 MED ORDER — SODIUM CHLORIDE 0.9% FLUSH: 3.0000 mL | INTRAVENOUS | Status: DC | PRN

## 2018-06-23 MED ORDER — ACETAMINOPHEN 650 MG RE SUPP: 650.0000 mg | Freq: Four times a day (QID) | RECTAL | Status: DC | PRN

## 2018-06-23 MED ORDER — HYDROCODONE-ACETAMINOPHEN 5-325 MG PO TABS: 1.0000 | ORAL_TABLET | ORAL | Status: DC | PRN

## 2018-06-23 MED ORDER — PANTOPRAZOLE SODIUM 40 MG PO TBEC
40.0000 mg | DELAYED_RELEASE_TABLET | Freq: Every day | ORAL | Status: DC
  Administered 2018-06-24 – 2018-06-29 (×6): 40 mg via ORAL
  Filled 2018-06-23 (×6): qty 1

## 2018-06-23 MED ORDER — HYDROCODONE-ACETAMINOPHEN 5-325 MG PO TABS
1.0000 | ORAL_TABLET | ORAL | Status: DC | PRN
  Administered 2018-06-24 – 2018-06-29 (×12): 1 via ORAL
  Filled 2018-06-23 (×12): qty 1

## 2018-06-23 MED ORDER — ONDANSETRON HCL 4 MG PO TABS
4.0000 mg | ORAL_TABLET | Freq: Four times a day (QID) | ORAL | Status: DC | PRN
  Administered 2018-06-24: 4 mg via ORAL
  Filled 2018-06-23: qty 1

## 2018-06-23 MED ORDER — BISACODYL 5 MG PO TBEC: 5.0000 mg | DELAYED_RELEASE_TABLET | Freq: Every day | ORAL | Status: DC | PRN

## 2018-06-23 MED ORDER — SODIUM CHLORIDE 0.9% FLUSH
3.0000 mL | Freq: Two times a day (BID) | INTRAVENOUS | Status: DC
  Administered 2018-06-24 – 2018-06-28 (×9): 3 mL via INTRAVENOUS
  Administered 2018-06-29: 10:00:00 via INTRAVENOUS

## 2018-06-23 NOTE — H&P (Signed)
History and Physical    [name] VOZ:[id number] DOB: 11/07/1940 DOA: 06/23/2018  PCP: [name], MD   Patient coming from: Home, by way of [facility]   Chief Complaint: Back pain   HPI: [name] is a 78 y.o. male with medical history significant for paroxysmal atrial fibrillation on Eliquis and recurrent syncopal episodes, now presenting to the emergency department for evaluation of severe low back pain.  Patient reports that he has been undergoing outpatient evaluations for pain in his neck with radiation to the arm, but this had been stable and he had otherwise been doing well until just after eating lunch today when he developed acute onset of severe pain in his mid to lower back.  There was no preceding trauma or inciting factor identified and he had never experienced this type of pain previously.  He denies any abdominal pain, chest pain, cough, or shortness of breath.  He denies any fevers or chills.  He denies any swelling or tenderness involving the lower extremities.    ED Course: Upon arrival to the ED, patient is found to be afebrile, saturating adequately on room air, and with stable blood pressure.  Chemistry panel was unremarkable and CBC notable for leukocytosis to 20,400.  Lactic acid is reassuringly normal and troponin was undetectable.  Urinalysis notable for trace blood.  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  Patient was treated with IV analgesics in the ED, and the ED physician arranged transfer to [facility] for [facility].  Review of Systems:  All other systems reviewed and apart from HPI, are negative.  Past Medical History:  Diagnosis Date  . Diverticulosis    . Esophageal reflux   . Esophageal stricture   . HA (headache)   . Hearing loss   . Hiatal hernia   . Hypertriglyceridemia   . Hypoglycemia   . Obesity, unspecified   . PAF (paroxysmal atrial fibrillation) (HCC)    Echo with normal LV function and mild LVH  . Syncope    presented with atrial fib converted with Diltiazem    Past Surgical History:  Procedure Laterality Date  . LOOP RECORDER INSERTION N/A 04/26/2018   Procedure: LOOP RECORDER INSERTION;  Surgeon: Thompson Grayer, MD;  Location: [place] [facility];  Service: Cardiovascular;  Laterality: N/A;  . NASAL SEPTUM SURGERY    . TONSILLECTOMY    . UMBILICAL HERNIA REPAIR  1993  . WISDOM TOOTH EXTRACTION     about 78 years old     reports that he quit smoking about 31 years ago. He has never used smokeless tobacco. He reports that he does not drink alcohol or use drugs.  Allergies  Allergen Reactions  . Codeine Other (See Comments)    Passed out  . Monosodium Glutamate Other (See Comments)    Caused headaches    Family History  Problem Relation Age of Onset  . Emphysema Father   . Gallbladder disease Sister   . Bipolar disorder Sister   . Bipolar disorder Paternal Grandmother   . Colon cancer Neg Hx      Prior to Admission medications   Medication Sig Start Date End Date Taking? Authorizing Provider  atorvastatin (LIPITOR) 20 MG  tablet Take 1 tablet (20 mg total) by mouth daily. 01/11/18   [name], MD  ELIQUIS 5 MG TABS tablet Take 1 tablet by mouth twice daily 05/29/18   Nahser, Wonda Cheng, MD  esomeprazole (NEXIUM) 40 MG capsule Take 1 capsule (40 mg total) by mouth daily. Patient taking differently: Take 40 mg by mouth daily before breakfast.  01/16/18   Irene Shipper, MD  omega-3 acid ethyl esters (LOVAZA) 1 g capsule Take 2 capsules by mouth twice daily 04/28/18   [name], MD  solifenacin (VESICARE) 10 MG tablet Take 1 tablet (10 mg total) by mouth daily. 05/01/18   [name], MD     Physical Exam: Vitals:   06/23/18 2255  BP: 139/78  Pulse: 83  Temp: (!) 97.5 F (36.4 C)  TempSrc: Oral  SpO2: 90%    Constitutional: NAD, calm  Eyes: PERTLA, lids and conjunctivae normal ENMT: Mucous membranes are moist. Posterior pharynx clear of any exudate or lesions.   Neck: normal, supple, no masses, no thyromegaly Respiratory: clear to auscultation bilaterally, no wheezing, no crackles. No accessory muscle use.  Cardiovascular: S1 & S2 heard, regular rate and rhythm. No extremity edema.   Abdomen: No distension, no tenderness, soft. Bowel sounds active.  Musculoskeletal: no clubbing / cyanosis. No joint deformity upper and lower extremities.    Skin: no significant rashes, lesions, ulcers. Warm, dry, well-perfused. Neurologic: Gross hearing deficit. Sensation intact. Strength 5/5 in all 4 limbs.  Psychiatric: Alert and oriented to person, place, and situation. Calm, cooperative.     Labs on Admission: I have personally reviewed following labs and imaging studies  CBC: No results for input(s): WBC, NEUTROABS, HGB, HCT, MCV, PLT in the last 168 hours. Basic Metabolic Panel: No results for input(s): NA, K, CL, CO2, GLUCOSE, BUN, CREATININE, CALCIUM, MG, PHOS in the last 168 hours. GFR: CrCl cannot be calculated (Patient's most recent lab result is older than the maximum 21 days allowed.). Liver Function Tests: No results for input(s): AST, ALT, ALKPHOS, BILITOT, PROT, ALBUMIN in the last 168 hours. No results for input(s): LIPASE, AMYLASE in the last 168 hours. No results for input(s): AMMONIA in the last 168 hours. Coagulation Profile: No results for input(s): INR, PROTIME in the last 168 hours. Cardiac Enzymes: No results for input(s): CKTOTAL, CKMB, CKMBINDEX, TROPONINI in the last 168 hours. BNP (last 3 results) No results for input(s): PROBNP in the last 8760 hours. HbA1C: No results for input(s): HGBA1C in the last 72 hours. CBG: No results for input(s):  GLUCAP in the last 168 hours. Lipid Profile: No results for input(s): CHOL, HDL, LDLCALC, TRIG, CHOLHDL, LDLDIRECT in the last 72 hours. Thyroid Function Tests: No results for input(s): TSH, T4TOTAL, FREET4, T3FREE, THYROIDAB in the last 72 hours. Anemia Panel: No results for input(s): VITAMINB12, FOLATE, FERRITIN, TIBC, IRON, RETICCTPCT in the last 72 hours. Urine analysis:    Component Value Date/Time   COLORURINE YELLOW 04/24/2018 1711   APPEARANCEUR CLEAR 04/24/2018 1711   LABSPEC 1.014 04/24/2018 1711   PHURINE 6.0 04/24/2018 1711   GLUCOSEU NEGATIVE 04/24/2018 1711   GLUCOSEU NEGATIVE 11/28/2012 1001   HGBUR NEGATIVE 04/24/2018 1711   BILIRUBINUR NEGATIVE 04/24/2018 1711   BILIRUBINUR neg 02/20/2015 1133   KETONESUR 5 (A) 04/24/2018 1711   PROTEINUR NEGATIVE 04/24/2018 1711   UROBILINOGEN 0.2 02/20/2015 1133   UROBILINOGEN 0.2 11/28/2012 1001   NITRITE NEGATIVE 04/24/2018 1711   LEUKOCYTESUR NEGATIVE 04/24/2018 1711   Sepsis Labs: @LABRCNTIP (procalcitonin:4,lacticidven:4) )No  results found for this or any previous visit (from the past 240 hour(s)).   Radiological Exams on Admission: No results found.  EKG: Not performed.   Assessment/Plan   1. Intractable back pain; T12 and L1 compression fractures  - Presents with acute back pain and found to have compression fractures involving T12 and L1, possibly pathologic  - There is no extremity weakness or numbness  - He had some improvement with IV morphine in ED, but continues to complain of severe pain  - Continue pain-control, transition to oral agents as tolerated    2. Left renal mass  - Presents with severe low back pain and is found to have a large left renal mass on CT that is most concerning for RCC  - There is an associated left adrenal lesion, indeterminate lung nodule, the vertebral fractures, and no mention of renal vein invasion  - Anticipate urology consultation   3. Paroxysmal atrial fibrillation  - In a  regular rhythm on admission  - CHADS-VASc at least 2 for age  - Eliquis held pending discussion with urology    4. Leukocytosis  - WBC is 20,400 on admission without fever or evidence for infection  - Likely reactive, culture if febrile    5. Lung nodule  - 6 mm lingular nodule noted incidentally on CT in ED  - Attention on follow-up imaging     PPE: Mask, face shield  DVT prophylaxis: Eliquis  Code Status: Full  Family Communication: Discussed with patient  Consults called: None  Admission status: Observation     Vianne Bulls, MD [facility] Pager [phone]  If 7PM-7AM, please contact night-coverage [email or web address] Password [facility]  06/23/2018, 11:23 PM

## 2018-06-23 NOTE — Telephone Encounter (Signed)
New message   Patient's wife wants to know if anything registered on her husband device around 1:30 pm today. Please call to discuss.

## 2018-06-23 NOTE — Progress Notes (Signed)
Direct admit from  [place] admissions MD notified of pt's arrival

## 2018-06-23 NOTE — Telephone Encounter (Signed)
Unable to reach patient's wife this evening, per CareEverywhere notes, pt to be admitted to [place].   Chanetta Marshall, NP 06/23/2018 8:53 PM

## 2018-06-23 NOTE — Telephone Encounter (Signed)
Spoke with patient's wife. He had a syncopal spell this afternoon. EMS was called and he was transported to [facility]. She has not heard an update from them yet and she isn't allowed to be there with him.  I have advised that hopefully they will check his LINQ in the ER but if not to send a manual transmission when they get home and I will review for them. I will call her back later this evening to check on him.  Chanetta Marshall, NP 06/23/2018 4:52 PM

## 2018-06-24 ENCOUNTER — Other Ambulatory Visit: Payer: Self-pay

## 2018-06-24 DIAGNOSIS — R911 Solitary pulmonary nodule: Secondary | ICD-10-CM

## 2018-06-24 LAB — BASIC METABOLIC PANEL
Anion gap: 13 (ref 5–15)
BUN: 16 mg/dL (ref 8–23)
CO2: 19 mmol/L — ABNORMAL LOW (ref 22–32)
Calcium: 8.7 mg/dL — ABNORMAL LOW (ref 8.9–10.3)
Chloride: 106 mmol/L (ref 98–111)
Creatinine, Ser: 0.83 mg/dL (ref 0.61–1.24)
GFR calc Af Amer: >60 mL/min (ref >60)
GFR calc non Af Amer: >60 mL/min (ref >60)
Glucose, Bld: 121 mg/dL — ABNORMAL HIGH (ref 70–99)
Potassium: 4.5 mmol/L (ref 3.5–5.1)
Sodium: 138 mmol/L (ref 135–145)

## 2018-06-24 LAB — CBC WITH DIFFERENTIAL/PLATELET
Abs Immature Granulocytes: 0.31 10*3/uL — ABNORMAL HIGH (ref 0.00–0.07)
Basophils Absolute: 0.1 10*3/uL (ref 0.0–0.1)
Basophils Relative: 0 %
Eosinophils Absolute: 0 10*3/uL (ref 0.0–0.5)
Eosinophils Relative: 0 %
HCT: 49.2 % (ref 39.0–52.0)
Hemoglobin: 16.9 g/dL (ref 13.0–17.0)
Immature Granulocytes: 2 %
Lymphocytes Relative: 5 %
Lymphs Abs: 0.7 10*3/uL (ref 0.7–4.0)
MCH: 33.1 pg (ref 26.0–34.0)
MCHC: 34.3 g/dL (ref 30.0–36.0)
MCV: 96.3 fL (ref 80.0–100.0)
Monocytes Absolute: 0.8 10*3/uL (ref 0.1–1.0)
Monocytes Relative: 6 %
Neutro Abs: 13 10*3/uL — ABNORMAL HIGH (ref 1.7–7.7)
Neutrophils Relative %: 87 %
Platelets: 135 10*3/uL — ABNORMAL LOW (ref 150–400)
RBC: 5.11 MIL/uL (ref 4.22–5.81)
RDW: 14.6 % (ref 11.5–15.5)
WBC: 14.9 10*3/uL — ABNORMAL HIGH (ref 4.0–10.5)
nRBC: 0 % (ref 0.0–0.2)

## 2018-06-24 MED ORDER — HYDROMORPHONE HCL 1 MG/ML IJ SOLN
1.0000 mg | INTRAMUSCULAR | Status: DC | PRN
  Administered 2018-06-25 – 2018-06-28 (×5): 1 mg via INTRAVENOUS
  Filled 2018-06-24 (×5): qty 1

## 2018-06-24 MED ORDER — HYDROMORPHONE HCL 1 MG/ML IJ SOLN
0.5000 mg | INTRAMUSCULAR | Status: DC | PRN
  Administered 2018-06-24: 11:00:00 0.5 mg via INTRAVENOUS
  Filled 2018-06-24: qty 0.5

## 2018-06-24 NOTE — Progress Notes (Signed)
Pt  admitted to [facility] from [facility]. Pt was Oriented to unit and fall education completed. Pt verbalized understanding risks associated with falls. Minor  bruises to BUE otherwise skin intact.

## 2018-06-24 NOTE — Progress Notes (Signed)
PROGRESS NOTE    [name]  IOE:[id number] DOB: 08-16-1940 DOA: 06/23/2018 PCP: [name], MD    Brief Narrative: Frank Carns. is a 78 y.o. male with medical history significant for paroxysmal atrial fibrillation on Eliquis and recurrent syncopal episodes, now presenting to the emergency department for evaluation of severe low back pain. 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  Assessment & Plan:   Principal Problem:   Intractable low back pain Active Problems:   PAF (paroxysmal atrial fibrillation) (HCC)   Pathologic vertebral fracture   Left renal mass   Left adrenal mass (HCC)   Lung nodule seen on imaging study   Abdominal aortic aneurysm (AAA) without rupture (HCC)   Leukocytosis  #1.  Intractable low back pain with acute /T12 and L1 compression fractures.  Patient presenting from [facility] with acute onset of low back pain. CT angiogram of the chest/abdomen/pelvis concerning for fractures involving T12 and L1 possibly pathology.  Consulted neurosurgery on call and she promised to review CT images and advise on possible plan of care. Continue with pain management Patient may need back brace orthotics  #2.  Left renal mass.  CT angiogram showed a 6.9 x 5.6 x  7.5 left upper pole kidney mass concerning for possible renal cell carcinoma. Called and spoke with Dr. Loletha Grayer is a urologist on call and he thinks that patient will not need immediate intervention while on discharge. Patient will probably be followed as outpatient on discharge.   #3.  Paroxysmal atrial fibrillation currently on Eliquis (CHA2DS2-VASC score =2.  Rate controlled. Continue with Eliquis  #4.   Leukocytosis.  Patient presenting with leukocytosis of 20,400 on admission without any fever or source of infection.  Concern for possible myeloproliferative disease in the setting of acute onset of thoracolumbar fracture. We will obtain SPEP and UPEP and protein electrophoresis  #5.  Left lung lingula nodule 6 mm in size, incidental on CT. Patient will need outpatient follow-up with pulmonology.  #6.  Left adrenal mass found on CT angiogram of abdomen and pelvis.  Coupled with the left renal mass left lingula nodule and acute vertebral fracture or pointing towards a neoplastic process. Outpatient follow-up with urology at discharge.   #7.  Abdominal aortic aneurysm 3.1 cm in size and stable. No intervention needed at this time.   DVT prophylaxis: Eliquis  Code Status: Full code Family Communication:  Disposition Plan:   Consultants:   Neurosurgery   Procedures:None  Antimicrobials:None  Subjective: Patient was seen and evaluated at bedside. Complaining of back pain and asking if and when he can get surgery for his back. Consulted neurosurgeon on call who promised to review images and call back with plan of care.Urologist does not think that any intervention is required at this time and will need outpatient follow up for renal mass.  Objective: Vitals:   06/23/18 2255 06/24/18 0538  BP: 139/78 124/71  Pulse: 83 85  Resp: 16   Temp: (!) 97.5 F (36.4 C) 97.7 F (36.5 C)  TempSrc: Oral Oral  SpO2: 90% 91%  Weight: 87.3 kg   Height: 5\' 7"  (1.702 m)    No intake or output data in  the 24 hours ending 06/24/18 0755 Filed Weights   06/23/18 2255  Weight: 87.3 kg    Examination:  General exam: Appears calm and comfortable  Respiratory system: Clear to auscultation. Respiratory effort normal. Cardiovascular system: S1 & S2 heard, regular rate and irregular rythm. No JVD, murmurs, rubs, gallops or clicks. No pedal edema. Gastrointestinal system: Abdomen is nondistended,  soft and nontender. No organomegaly or masses felt. Normal bowel sounds heard. Central nervous system: Alert and oriented. Hard to hearing. No focal neurological deficits. Extremities: No pedal edema. Symmetric 5 x 5 power. Skin: No rashes, lesions or ulcers Psychiatry: Judgement and insight appear normal. Mood & affect appropriate.     Data Reviewed: I have personally reviewed following labs and imaging studies  CBC: Recent Labs  Lab 06/24/18 0258  WBC 14.9*  NEUTROABS 13.0*  HGB 16.9  HCT 49.2  MCV 96.3  PLT 494*   Basic Metabolic Panel: Recent Labs  Lab 06/24/18 0258  NA 138  K 4.5  CL 106  CO2 19*  GLUCOSE 121*  BUN 16  CREATININE 0.83  CALCIUM 8.7*   GFR: Estimated Creatinine Clearance: 79.9 mL/min (by C-G formula based on SCr of 0.83 mg/dL). Liver Function Tests: No results for input(s): AST, ALT, ALKPHOS, BILITOT, PROT, ALBUMIN in the last 168 hours. No results for input(s): LIPASE, AMYLASE in the last 168 hours. No results for input(s): AMMONIA in the last 168 hours. Coagulation Profile: No results for input(s): INR, PROTIME in the last 168 hours. Cardiac Enzymes: No results for input(s): CKTOTAL, CKMB, CKMBINDEX, TROPONINI in the last 168 hours. BNP (last 3 results) No results for input(s): PROBNP in the last 8760 hours. HbA1C: No results for input(s): HGBA1C in the last 72 hours. CBG: No results for input(s): GLUCAP in the last 168 hours. Lipid Profile: No results for input(s): CHOL, HDL, LDLCALC, TRIG, CHOLHDL, LDLDIRECT in the last 72 hours. Thyroid Function Tests: No results for input(s): TSH, T4TOTAL, FREET4, T3FREE, THYROIDAB in the last 72 hours. Anemia Panel: No results for input(s): VITAMINB12, FOLATE, FERRITIN, TIBC, IRON, RETICCTPCT in the last 72 hours. Sepsis Labs: No results for input(s): PROCALCITON, LATICACIDVEN in the last 168 hours.  No results found for this or any previous visit (from the past 240 hour(s)).       Radiology  Studies: No results found.      Scheduled Meds: . darifenacin  15 mg Oral Daily  . pantoprazole  40 mg Oral Daily  . sodium chloride flush  3 mL Intravenous Q12H   Continuous Infusions: . sodium chloride       LOS: 1 day        Elie Confer, MD [facility] Pager [phone]  If 7PM-7AM, please contact night-coverage [email or web address] Password [facility] 06/24/2018, 7:55 AM

## 2018-06-24 NOTE — Plan of Care (Deleted)
Staff to monitor and treat

## 2018-06-25 ENCOUNTER — Observation Stay (HOSPITAL_COMMUNITY): Payer: Medicare Other

## 2018-06-25 DIAGNOSIS — M4855XA Collapsed vertebra, not elsewhere classified, thoracolumbar region, initial encounter for fracture: Secondary | ICD-10-CM | POA: Diagnosis not present

## 2018-06-25 DIAGNOSIS — R0602 Shortness of breath: Secondary | ICD-10-CM | POA: Diagnosis not present

## 2018-06-25 DIAGNOSIS — M546 Pain in thoracic spine: Secondary | ICD-10-CM | POA: Diagnosis not present

## 2018-06-25 DIAGNOSIS — M545 Low back pain: Secondary | ICD-10-CM | POA: Diagnosis not present

## 2018-06-25 LAB — COMPREHENSIVE METABOLIC PANEL
ALT: 30 U/L (ref 0–44)
AST: 22 U/L (ref 15–41)
Albumin: 3.3 g/dL — ABNORMAL LOW (ref 3.5–5.0)
Alkaline Phosphatase: 61 U/L (ref 38–126)
Anion gap: 11 (ref 5–15)
BUN: 15 mg/dL (ref 8–23)
CO2: 22 mmol/L (ref 22–32)
Calcium: 8.7 mg/dL — ABNORMAL LOW (ref 8.9–10.3)
Chloride: 99 mmol/L (ref 98–111)
Creatinine, Ser: 0.76 mg/dL (ref 0.61–1.24)
GFR calc Af Amer: >60 mL/min (ref >60)
GFR calc non Af Amer: >60 mL/min (ref >60)
Glucose, Bld: 144 mg/dL — ABNORMAL HIGH (ref 70–99)
Potassium: 3.7 mmol/L (ref 3.5–5.1)
Sodium: 132 mmol/L — ABNORMAL LOW (ref 135–145)
Total Bilirubin: 1.6 mg/dL — ABNORMAL HIGH (ref 0.3–1.2)
Total Protein: 6.2 g/dL — ABNORMAL LOW (ref 6.5–8.1)

## 2018-06-25 LAB — MAGNESIUM: Magnesium: 1.9 mg/dL (ref 1.7–2.4)

## 2018-06-25 LAB — CBC
HCT: 44.9 % (ref 39.0–52.0)
Hemoglobin: 15.5 g/dL (ref 13.0–17.0)
MCH: 32.6 pg (ref 26.0–34.0)
MCHC: 34.5 g/dL (ref 30.0–36.0)
MCV: 94.5 fL (ref 80.0–100.0)
Platelets: 123 10*3/uL — ABNORMAL LOW (ref 150–400)
RBC: 4.75 MIL/uL (ref 4.22–5.81)
RDW: 14.2 % (ref 11.5–15.5)
WBC: 17.7 10*3/uL — ABNORMAL HIGH (ref 4.0–10.5)
nRBC: 0 % (ref 0.0–0.2)

## 2018-06-25 LAB — PHOSPHORUS: Phosphorus: 3.3 mg/dL (ref 2.5–4.6)

## 2018-06-25 NOTE — Progress Notes (Signed)
Orthopedic Tech Progress Note Patient Details:  Frank [name] 1940-06-25 [id number] Called in order to [name] for brace Patient ID: [name], male   DOB: 29-Apr-1940, 78 y.o.   MRN: [id number]   [name] 06/25/2018, 12:17 PM

## 2018-06-25 NOTE — Care Management Obs Status (Signed)
[place] NOTIFICATION   Patient Details  Name: [name] MRN: [id number] Date of Birth: 1940/09/26   Medicare Observation Status Notification Given:  Yes    Carles Collet, RN 06/25/2018, 10:15 AM

## 2018-06-25 NOTE — Progress Notes (Signed)
PROGRESS NOTE    [name]  FYB:[id number] DOB: [birth year] DOA: 06/23/2018 PCP: [name], MD   Brief Narrative: Frank Danielsis a 78 y.o.malewith medical history significant forparoxysmal atrial fibrillation on Eliquis and recurrent syncopal episodes, now presenting to the emergency department for evaluation of severe low back pain. 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   Assessment & Plan:   Principal Problem:   Intractable low back pain Active Problems:   PAF (paroxysmal atrial fibrillation) (HCC)   Pathologic vertebral fracture   Left renal mass   Left adrenal mass (HCC)   Lung nodule seen on imaging study   Abdominal aortic aneurysm (AAA) without rupture (HCC)   Leukocytosis   #1.  Intractable low back pain with acute /T12 and L1 compression fractures.  Patient presenting from [facility] with acute onset of low back pain. CT angiogram of the chest/abdomen/pelvis concerning for fractures involving T12 and L1 possibly pathology.  Consulted neurosurgery and spoke with [facility]. Neurosurgery recommended TLSO brace and obtain upright AP xray of spine. Recommended to d/c patient and to follow up in neurosurgery clinic. Concerns for possible myeloproliferative disease give the pathologic vertebral fracture. Obtain UPEP/SPEP and Protein electrophoresis Continue with pain management TLSO brace ordered.  #2.  Left renal mass.  CT angiogram showed a 6.9 x 5.6 x  7.5 left upper pole kidney mass concerning for possible renal cell carcinoma. Called and spoke with Dr. Alinda Money who is the urologist on call and he thinks that patient will not need immediate intervention while on  admission. Patient will probably be followed as outpatient on discharge.   #3.  Paroxysmal atrial fibrillation currently on Eliquis (CHA2DS2-VASC score =3.  Rate controlled. Continue with Eliquis  #4.  Leukocytosis.  Patient presenting with leukocytosis of 14.9 on admission without any fever or source of infection. Possibly reactive. Will continue to monitor CBC   #5.  Left lung lingula nodule 6 mm in size, incidental on CT. Patient will need outpatient follow-up with pulmonology.  #6.  Left adrenal mass found on CT angiogram of abdomen and pelvis.  Coupled with the left renal mass left lingula nodule and acute vertebral fracture all  pointing towards a possible neoplastic process. Outpatient follow-up with endocrinology/urology at discharge.           #7.  Abdominal aortic aneurysm 3.1 cm in size and stable. No intervention needed at this time.    DVT prophylaxis: Eliquis  Code Status: Full code  Family Communication: I spoke with patient's wife [name] about plans for discharge home tomorrow.  She requested for home health aide.  I informed her that neurosurgery does not request physical therapy with his kind of injury and that he will need a follow-up appointment with neurosurgery, urology, and pulmonology.  Disposition Plan: Possible discharge home tomorrow.  Consultants:   Neurosurgery [facility])  Urology Dr Alinda Money   Procedures:None   Antimicrobials: None    Subjective: Patient was seen and evaluated at bedside.  Still have back pain but getting much better.  Spoke with neurosurgeon on call and recommended TLSO bracing and upright AP film of lumbar spine. Plan to discharge patient home tomorrow and follow-up at neurosurgery clinic in 1 week.  Objective: Vitals:  06/24/18 2212 06/24/18 2220 06/24/18 2339 06/25/18 0559  BP:    132/70  Pulse:    87  Resp:    18  Temp:    (!) 97.3 F (36.3 C)  TempSrc:    Oral  SpO2: 92% (!) 83% 92% 92%  Weight:       Height:        Intake/Output Summary (Last 24 hours) at 06/25/2018 1201 Last data filed at 06/25/2018 0609 Gross per 24 hour  Intake 240 ml  Output 500 ml  Net -260 ml   Filed Weights   06/23/18 2255  Weight: 87.3 kg    Examination:  General exam: Appears calm and comfortable. Hard to hearing.  Respiratory system: Clear to auscultation. Respiratory effort normal. Cardiovascular system: S1 & S2 heard, RRR. No JVD, murmurs, rubs, gallops or clicks. No pedal edema. Gastrointestinal system: Abdomen is nondistended, soft and nontender. No organomegaly or masses felt. Normal bowel sounds heard. Central nervous system: Back pain. Alert and oriented. No focal neurological deficits.  Had to hearing Extremities: Symmetric 5 x 5 power. Skin: No rashes, lesions or ulcers Psychiatry: Judgement and insight appear normal. Mood & affect appropriate.     Data Reviewed: I have personally reviewed following labs and imaging studies  CBC: Recent Labs  Lab 06/24/18 0258 06/25/18 0259  WBC 14.9* 17.7*  NEUTROABS 13.0*  --   HGB 16.9 15.5  HCT 49.2 44.9  MCV 96.3 94.5  PLT 135* 607*   Basic Metabolic Panel: Recent Labs  Lab 06/24/18 0258 06/25/18 0259  NA 138 132*  K 4.5 3.7  CL 106 99  CO2 19* 22  GLUCOSE 121* 144*  BUN 16 15  CREATININE 0.83 0.76  CALCIUM 8.7* 8.7*  MG  --  1.9  PHOS  --  3.3   GFR: Estimated Creatinine Clearance: 82.9 mL/min (by C-G formula based on SCr of 0.76 mg/dL). Liver Function Tests: Recent Labs  Lab 06/25/18 0259  AST 22  ALT 30  ALKPHOS 61  BILITOT 1.6*  PROT 6.2*  ALBUMIN 3.3*   No results for input(s): LIPASE, AMYLASE in the last 168 hours. No results for input(s): AMMONIA in the last 168 hours. Coagulation Profile: No results for input(s): INR, PROTIME in the last 168 hours. Cardiac Enzymes: No results for input(s): CKTOTAL, CKMB, CKMBINDEX, TROPONINI in the last 168 hours. BNP (last 3 results) No results for input(s): PROBNP in  the last 8760 hours. HbA1C: No results for input(s): HGBA1C in the last 72 hours. CBG: No results for input(s): GLUCAP in the last 168 hours. Lipid Profile: No results for input(s): CHOL, HDL, LDLCALC, TRIG, CHOLHDL, LDLDIRECT in the last 72 hours. Thyroid Function Tests: No results for input(s): TSH, T4TOTAL, FREET4, T3FREE, THYROIDAB in the last 72 hours. Anemia Panel: No results for input(s): VITAMINB12, FOLATE, FERRITIN, TIBC, IRON, RETICCTPCT in the last 72 hours. Sepsis Labs: No results for input(s): PROCALCITON, LATICACIDVEN in the last 168 hours.  No results found for this or any previous visit (from the past 240 hour(s)).       Radiology Studies: Mr Thoracic Spine Wo Contrast  Result Date: 06/25/2018 CLINICAL DATA:  Severe low back pain and mid back pain began 06/23/2018. Patient is anticoagulated. History of syncope. EXAM: MRI THORACIC AND LUMBAR SPINE WITHOUT CONTRAST TECHNIQUE: Multiplanar and multiecho pulse sequences of the thoracic and lumbar spine were obtained without intravenous contrast. COMPARISON:  CT chest abdomen pelvis 06/23/2018 revealed a large renal mass. FINDINGS: MRI THORACIC SPINE FINDINGS Alignment:  Anatomic Vertebrae: Acute compression fracture of T12. This is T1 hypointense, T2 hypointense, and STIR hyperintense. Loss of approximately 50% vertebral body height. Retropulsed bone, greater on the LEFT, 2-3 mm. No clear involvement of the pedicles or posterior elements. Assessment for epidural tumor or paravertebral soft tissue is limited in the absence of contrast. Cord:  Normal signal and morphology. Paraspinal and other soft tissues: LEFT renal and LEFT adrenal mass. Disc levels: No thoracic disc protrusion or spinal stenosis. MRI LUMBAR SPINE FINDINGS Segmentation:  Standard. Alignment:  Anatomic. Vertebrae: Acute L1 compression fracture, loss of approximately 50% vertebral body height. The vertebral body is T1 hypointense, T2 hypointense, and STIR  hyperintense. 5 mm retropulsed fragment primarily midline. No definite involvement of the pedicles or posterior elements. In addition, there are fractures of L3, and L4 across the superior endplate, without compression deformity. Additional fracture without loss of vertebral body height is noted L5 inferiorly. Assessment for epidural tumor or paravertebral soft tissue representing tumor is limited in the absence of contrast. Conus medullaris and cauda equina: Conus extends to the mid L2 level. Conus and cauda equina appear otherwise normal. Paraspinal and other soft tissues: LEFT renal and LEFT adrenal mass. The bladder is distended. Abdominal aortic aneurysm better demonstrated on CT. Disc levels: No lumbar disc protrusion or spinal stenosis. IMPRESSION: MR THORACIC SPINE IMPRESSION Acute compression fracture, T12, as predicted from CT. Loss of approximately 50% vertebral body height. MR LUMBAR SPINE IMPRESSION Acute compression fracture, L1, as predicted from CT. Loss of approximately 50% vertebral body height. Superior endplate fractures at L3 and L4, without frank compression. Additional inferior endplate fracture at L5, again without loss of height. No visible conus compression, or definite epidural tumor. LEFT renal and LEFT adrenal masses, better demonstrated on CT, consistent with neoplasm. Bladder distention, query outlet obstruction. 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 Post infusion imaging could be helpful however, and is recommended for further evaluation if no contraindications. Electronically Signed   By: Staci Righter M.D.   On: 06/25/2018 10:20   Mr Lumbar Spine Wo Contrast  Result Date: 06/25/2018 CLINICAL DATA:  Severe low back pain and mid back pain began 06/23/2018. Patient is anticoagulated. History of syncope. EXAM: MRI THORACIC AND LUMBAR SPINE WITHOUT  CONTRAST TECHNIQUE: Multiplanar and multiecho pulse sequences of the thoracic and lumbar spine were obtained without intravenous contrast. COMPARISON:  CT chest abdomen pelvis 06/23/2018 revealed a large renal mass. FINDINGS: MRI THORACIC SPINE FINDINGS Alignment:  Anatomic Vertebrae: Acute compression fracture of T12. This is T1 hypointense, T2 hypointense, and STIR hyperintense. Loss of approximately 50% vertebral body height. Retropulsed bone, greater on the LEFT, 2-3 mm. No clear involvement of the pedicles or posterior elements. Assessment for epidural tumor or paravertebral soft tissue is limited in the absence of contrast. Cord:  Normal signal and morphology. Paraspinal and other soft tissues: LEFT renal and LEFT adrenal mass. Disc levels: No thoracic disc protrusion or spinal stenosis. MRI LUMBAR SPINE FINDINGS Segmentation:  Standard. Alignment:  Anatomic. Vertebrae: Acute L1 compression fracture, loss of approximately 50% vertebral body height. The vertebral body is T1 hypointense, T2 hypointense, and STIR hyperintense. 5 mm retropulsed fragment primarily midline. No definite involvement of the pedicles or posterior elements. In addition, there are fractures of L3, and L4 across the superior endplate, without compression deformity. Additional fracture without loss of vertebral body height is noted L5 inferiorly. Assessment for epidural tumor or paravertebral  soft tissue representing tumor is limited in the absence of contrast. Conus medullaris and cauda equina: Conus extends to the mid L2 level. Conus and cauda equina appear otherwise normal. Paraspinal and other soft tissues: LEFT renal and LEFT adrenal mass. The bladder is distended. Abdominal aortic aneurysm better demonstrated on CT. Disc levels: No lumbar disc protrusion or spinal stenosis. IMPRESSION: MR THORACIC SPINE IMPRESSION Acute compression fracture, T12, as predicted from CT. Loss of approximately 50% vertebral body height. MR LUMBAR SPINE  IMPRESSION Acute compression fracture, L1, as predicted from CT. Loss of approximately 50% vertebral body height. Superior endplate fractures at L3 and L4, without frank compression. Additional inferior endplate fracture at L5, again without loss of height. No visible conus compression, or definite epidural tumor. LEFT renal and LEFT adrenal masses, better demonstrated on CT, consistent with neoplasm. Bladder distention, query outlet obstruction. 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 Post infusion imaging could be helpful however, and is recommended for further evaluation if no contraindications. Electronically Signed   By: Staci Righter M.D.   On: 06/25/2018 10:20   Dg Chest Port 1 View  Result Date: 06/25/2018 CLINICAL DATA:  Shortness of breath. EXAM: PORTABLE CHEST 1 VIEW COMPARISON:  06/23/2018 FINDINGS: The heart size is stable. Aeration improved since the prior study with bibasilar atelectasis remaining. There is no evidence of pulmonary edema, consolidation, pneumothorax, nodule or pleural fluid. IMPRESSION: Improved aeration with bibasilar atelectasis remaining. Electronically Signed   By: Aletta Edouard M.D.   On: 06/25/2018 08:05        Scheduled Meds:  darifenacin  15 mg Oral Daily   pantoprazole  40 mg Oral Daily   sodium chloride flush  3 mL Intravenous Q12H   Continuous Infusions:  sodium chloride       LOS: 1 day      Elie Confer, MD [facility] Pager [phone]  If 7PM-7AM, please contact night-coverage [email or web address] Password [facility] 06/25/2018, 12:01 PM

## 2018-06-26 ENCOUNTER — Inpatient Hospital Stay (HOSPITAL_COMMUNITY): Payer: Medicare Other

## 2018-06-26 ENCOUNTER — Observation Stay (HOSPITAL_COMMUNITY): Payer: Medicare Other

## 2018-06-26 DIAGNOSIS — R680 Hypothermia, not associated with low environmental temperature: Secondary | ICD-10-CM | POA: Diagnosis present

## 2018-06-26 DIAGNOSIS — Z825 Family history of asthma and other chronic lower respiratory diseases: Secondary | ICD-10-CM | POA: Diagnosis not present

## 2018-06-26 DIAGNOSIS — D72829 Elevated white blood cell count, unspecified: Secondary | ICD-10-CM | POA: Diagnosis not present

## 2018-06-26 DIAGNOSIS — Z6830 Body mass index (BMI) 30.0-30.9, adult: Secondary | ICD-10-CM | POA: Diagnosis not present

## 2018-06-26 DIAGNOSIS — M62830 Muscle spasm of back: Secondary | ICD-10-CM | POA: Diagnosis not present

## 2018-06-26 DIAGNOSIS — Z7901 Long term (current) use of anticoagulants: Secondary | ICD-10-CM | POA: Diagnosis not present

## 2018-06-26 DIAGNOSIS — T148XXA Other injury of unspecified body region, initial encounter: Secondary | ICD-10-CM | POA: Diagnosis not present

## 2018-06-26 DIAGNOSIS — I714 Abdominal aortic aneurysm, without rupture: Secondary | ICD-10-CM | POA: Diagnosis not present

## 2018-06-26 DIAGNOSIS — R6510 Systemic inflammatory response syndrome (SIRS) of non-infectious origin without acute organ dysfunction: Secondary | ICD-10-CM | POA: Diagnosis present

## 2018-06-26 DIAGNOSIS — Z7401 Bed confinement status: Secondary | ICD-10-CM | POA: Diagnosis not present

## 2018-06-26 DIAGNOSIS — R05 Cough: Secondary | ICD-10-CM | POA: Diagnosis not present

## 2018-06-26 DIAGNOSIS — Z9181 History of falling: Secondary | ICD-10-CM | POA: Diagnosis not present

## 2018-06-26 DIAGNOSIS — K219 Gastro-esophageal reflux disease without esophagitis: Secondary | ICD-10-CM | POA: Diagnosis present

## 2018-06-26 DIAGNOSIS — M4856XA Collapsed vertebra, not elsewhere classified, lumbar region, initial encounter for fracture: Secondary | ICD-10-CM | POA: Diagnosis present

## 2018-06-26 DIAGNOSIS — Z87891 Personal history of nicotine dependence: Secondary | ICD-10-CM | POA: Diagnosis not present

## 2018-06-26 DIAGNOSIS — E669 Obesity, unspecified: Secondary | ICD-10-CM | POA: Diagnosis present

## 2018-06-26 DIAGNOSIS — I495 Sick sinus syndrome: Secondary | ICD-10-CM | POA: Diagnosis not present

## 2018-06-26 DIAGNOSIS — E872 Acidosis: Secondary | ICD-10-CM | POA: Diagnosis present

## 2018-06-26 DIAGNOSIS — N2889 Other specified disorders of kidney and ureter: Secondary | ICD-10-CM | POA: Diagnosis not present

## 2018-06-26 DIAGNOSIS — R0602 Shortness of breath: Secondary | ICD-10-CM | POA: Diagnosis not present

## 2018-06-26 DIAGNOSIS — R911 Solitary pulmonary nodule: Secondary | ICD-10-CM | POA: Diagnosis present

## 2018-06-26 DIAGNOSIS — Z885 Allergy status to narcotic agent status: Secondary | ICD-10-CM | POA: Diagnosis not present

## 2018-06-26 DIAGNOSIS — I480 Paroxysmal atrial fibrillation: Secondary | ICD-10-CM | POA: Diagnosis not present

## 2018-06-26 DIAGNOSIS — M545 Low back pain: Secondary | ICD-10-CM | POA: Diagnosis not present

## 2018-06-26 DIAGNOSIS — Z95818 Presence of other cardiac implants and grafts: Secondary | ICD-10-CM | POA: Diagnosis not present

## 2018-06-26 DIAGNOSIS — M4854XA Collapsed vertebra, not elsewhere classified, thoracic region, initial encounter for fracture: Secondary | ICD-10-CM | POA: Diagnosis present

## 2018-06-26 DIAGNOSIS — E781 Pure hyperglyceridemia: Secondary | ICD-10-CM | POA: Diagnosis present

## 2018-06-26 DIAGNOSIS — M2550 Pain in unspecified joint: Secondary | ICD-10-CM | POA: Diagnosis not present

## 2018-06-26 DIAGNOSIS — E278 Other specified disorders of adrenal gland: Secondary | ICD-10-CM | POA: Diagnosis not present

## 2018-06-26 DIAGNOSIS — R55 Syncope and collapse: Secondary | ICD-10-CM | POA: Diagnosis not present

## 2018-06-26 DIAGNOSIS — M5489 Other dorsalgia: Secondary | ICD-10-CM | POA: Diagnosis not present

## 2018-06-26 DIAGNOSIS — I4891 Unspecified atrial fibrillation: Secondary | ICD-10-CM

## 2018-06-26 DIAGNOSIS — J439 Emphysema, unspecified: Secondary | ICD-10-CM | POA: Diagnosis not present

## 2018-06-26 DIAGNOSIS — Z79899 Other long term (current) drug therapy: Secondary | ICD-10-CM | POA: Diagnosis not present

## 2018-06-26 DIAGNOSIS — R000 Tachycardia, unspecified: Secondary | ICD-10-CM | POA: Diagnosis present

## 2018-06-26 DIAGNOSIS — E279 Disorder of adrenal gland, unspecified: Secondary | ICD-10-CM | POA: Diagnosis not present

## 2018-06-26 HISTORY — DX: Systemic inflammatory response syndrome (sirs) of non-infectious origin without acute organ dysfunction: R65.10

## 2018-06-26 LAB — CBC WITH DIFFERENTIAL/PLATELET
Abs Immature Granulocytes: 0.26 10*3/uL — ABNORMAL HIGH (ref 0.00–0.07)
Basophils Absolute: 0 10*3/uL (ref 0.0–0.1)
Basophils Relative: 0 %
Eosinophils Absolute: 0 10*3/uL (ref 0.0–0.5)
Eosinophils Relative: 0 %
HCT: 44.4 % (ref 39.0–52.0)
Hemoglobin: 15.6 g/dL (ref 13.0–17.0)
Immature Granulocytes: 1 %
Lymphocytes Relative: 5 %
Lymphs Abs: 1 10*3/uL (ref 0.7–4.0)
MCH: 33.1 pg (ref 26.0–34.0)
MCHC: 35.1 g/dL (ref 30.0–36.0)
MCV: 94.1 fL (ref 80.0–100.0)
Monocytes Absolute: 1.3 10*3/uL — ABNORMAL HIGH (ref 0.1–1.0)
Monocytes Relative: 7 %
Neutro Abs: 16.4 10*3/uL — ABNORMAL HIGH (ref 1.7–7.7)
Neutrophils Relative %: 87 %
Platelets: 135 10*3/uL — ABNORMAL LOW (ref 150–400)
RBC: 4.72 MIL/uL (ref 4.22–5.81)
RDW: 14.2 % (ref 11.5–15.5)
WBC: 19 10*3/uL — ABNORMAL HIGH (ref 4.0–10.5)
nRBC: 0 % (ref 0.0–0.2)

## 2018-06-26 LAB — COMPREHENSIVE METABOLIC PANEL
ALT: 29 U/L (ref 0–44)
ALT: 31 U/L (ref 0–44)
AST: 19 U/L (ref 15–41)
AST: 21 U/L (ref 15–41)
Albumin: 3.1 g/dL — ABNORMAL LOW (ref 3.5–5.0)
Albumin: 3.1 g/dL — ABNORMAL LOW (ref 3.5–5.0)
Alkaline Phosphatase: 56 U/L (ref 38–126)
Alkaline Phosphatase: 55 U/L (ref 38–126)
Anion gap: 11 (ref 5–15)
Anion gap: 13 (ref 5–15)
BUN: 22 mg/dL (ref 8–23)
BUN: 24 mg/dL — ABNORMAL HIGH (ref 8–23)
CO2: 21 mmol/L — ABNORMAL LOW (ref 22–32)
CO2: 25 mmol/L (ref 22–32)
Calcium: 8.8 mg/dL — ABNORMAL LOW (ref 8.9–10.3)
Calcium: 8.9 mg/dL (ref 8.9–10.3)
Chloride: 101 mmol/L (ref 98–111)
Chloride: 95 mmol/L — ABNORMAL LOW (ref 98–111)
Creatinine, Ser: 0.82 mg/dL (ref 0.61–1.24)
Creatinine, Ser: 0.94 mg/dL (ref 0.61–1.24)
GFR calc Af Amer: >60 mL/min (ref >60)
GFR calc Af Amer: >60 mL/min (ref >60)
GFR calc non Af Amer: >60 mL/min (ref >60)
GFR calc non Af Amer: >60 mL/min (ref >60)
Glucose, Bld: 129 mg/dL — ABNORMAL HIGH (ref 70–99)
Glucose, Bld: 117 mg/dL — ABNORMAL HIGH (ref 70–99)
Potassium: 3.8 mmol/L (ref 3.5–5.1)
Potassium: 4.4 mmol/L (ref 3.5–5.1)
Sodium: 133 mmol/L — ABNORMAL LOW (ref 135–145)
Sodium: 133 mmol/L — ABNORMAL LOW (ref 135–145)
Total Bilirubin: 1.3 mg/dL — ABNORMAL HIGH (ref 0.3–1.2)
Total Bilirubin: 0.9 mg/dL (ref 0.3–1.2)
Total Protein: 6.1 g/dL — ABNORMAL LOW (ref 6.5–8.1)
Total Protein: 6.1 g/dL — ABNORMAL LOW (ref 6.5–8.1)

## 2018-06-26 LAB — ECHOCARDIOGRAM COMPLETE
Height: 67 in
Weight: 3079.39 oz

## 2018-06-26 LAB — TROPONIN I: Troponin I: 0.03 ng/mL (ref <0.03)

## 2018-06-26 LAB — URINALYSIS, ROUTINE W REFLEX MICROSCOPIC
Bilirubin Urine: NEGATIVE
Glucose, UA: NEGATIVE mg/dL
Hgb urine dipstick: NEGATIVE
Ketones, ur: NEGATIVE mg/dL
Leukocytes,Ua: NEGATIVE
Nitrite: NEGATIVE
Protein, ur: NEGATIVE mg/dL
Specific Gravity, Urine: 1.028 (ref 1.005–1.030)
pH: 6 (ref 5.0–8.0)

## 2018-06-26 LAB — CBC
HCT: 44.1 % (ref 39.0–52.0)
Hemoglobin: 15.7 g/dL (ref 13.0–17.0)
MCH: 33 pg (ref 26.0–34.0)
MCHC: 35.6 g/dL (ref 30.0–36.0)
MCV: 92.6 fL (ref 80.0–100.0)
Platelets: 120 10*3/uL — ABNORMAL LOW (ref 150–400)
RBC: 4.76 MIL/uL (ref 4.22–5.81)
RDW: 14.1 % (ref 11.5–15.5)
WBC: 19.9 10*3/uL — ABNORMAL HIGH (ref 4.0–10.5)
nRBC: 0 % (ref 0.0–0.2)

## 2018-06-26 LAB — PHOSPHORUS: Phosphorus: 3.2 mg/dL (ref 2.5–4.6)

## 2018-06-26 LAB — PROTIME-INR
INR: 1.2 (ref 0.8–1.2)
Prothrombin Time: 14.8 seconds (ref 11.4–15.2)

## 2018-06-26 LAB — APTT: aPTT: 25 seconds (ref 24–36)

## 2018-06-26 LAB — MAGNESIUM: Magnesium: 1.9 mg/dL (ref 1.7–2.4)

## 2018-06-26 LAB — PROTEIN ELECTROPHORESIS, SERUM
A/G Ratio: 1.3 (ref 0.7–1.7)
Albumin ELP: 3.6 g/dL (ref 2.9–4.4)
Alpha-1-Globulin: 0.2 g/dL (ref 0.0–0.4)
Alpha-2-Globulin: 0.7 g/dL (ref 0.4–1.0)
Beta Globulin: 1 g/dL (ref 0.7–1.3)
Gamma Globulin: 0.8 g/dL (ref 0.4–1.8)
Globulin, Total: 2.7 g/dL (ref 2.2–3.9)
Total Protein ELP: 6.3 g/dL (ref 6.0–8.5)

## 2018-06-26 LAB — LACTIC ACID, PLASMA
Lactic Acid, Venous: 2.6 mmol/L (ref 0.5–1.9)
Lactic Acid, Venous: 2.2 mmol/L (ref 0.5–1.9)

## 2018-06-26 LAB — PROCALCITONIN: Procalcitonin: 0.26 ng/mL

## 2018-06-26 MED ORDER — SODIUM CHLORIDE 0.9 % IV BOLUS
500.0000 mL | Freq: Once | INTRAVENOUS | Status: AC
  Administered 2018-06-26: 17:00:00 500 mL via INTRAVENOUS

## 2018-06-26 MED ORDER — VANCOMYCIN HCL 10 G IV SOLR
1500.0000 mg | Freq: Once | INTRAVENOUS | Status: AC
  Administered 2018-06-26: 1500 mg via INTRAVENOUS
  Filled 2018-06-26: qty 1500

## 2018-06-26 MED ORDER — SODIUM CHLORIDE 0.9 % IV SOLN
INTRAVENOUS | Status: DC
  Administered 2018-06-27: 01:00:00 via INTRAVENOUS

## 2018-06-26 MED ORDER — SODIUM CHLORIDE 0.9 % IV SOLN
500.0000 mg | INTRAVENOUS | Status: DC
  Administered 2018-06-26: 18:00:00 500 mg via INTRAVENOUS
  Filled 2018-06-26 (×2): qty 500

## 2018-06-26 MED ORDER — SODIUM CHLORIDE 0.9 % IV SOLN
1.0000 g | Freq: Two times a day (BID) | INTRAVENOUS | Status: DC
  Administered 2018-06-26: 1 g via INTRAVENOUS
  Administered 2018-06-27: 06:00:00 via INTRAVENOUS
  Filled 2018-06-26 (×4): qty 1

## 2018-06-26 MED ORDER — APIXABAN 5 MG PO TABS
5.0000 mg | ORAL_TABLET | Freq: Two times a day (BID) | ORAL | Status: DC
  Administered 2018-06-26 – 2018-06-29 (×7): 5 mg via ORAL
  Filled 2018-06-26 (×7): qty 1

## 2018-06-26 MED ORDER — VANCOMYCIN HCL IN DEXTROSE 750-5 MG/150ML-% IV SOLN
750.0000 mg | Freq: Two times a day (BID) | INTRAVENOUS | Status: DC
  Administered 2018-06-27: 750 mg via INTRAVENOUS
  Filled 2018-06-26 (×2): qty 150

## 2018-06-26 NOTE — Progress Notes (Signed)
CRITICAL VALUE ALERT  Critical Value:  Lactic Acid 2.6  Date & Time Notied:  06/26/18 @ 2426  Provider Notified: MD paged  Orders Received/Actions taken: will await any new orders

## 2018-06-26 NOTE — Progress Notes (Addendum)
PROGRESS NOTE    [name]  QIO:[id number] DOB: Jul 21, 1940 DOA: 06/23/2018 PCP: [name], MD   Brief Narrative: Frank Danielsis a 78 y.o.malewith medical history significant forparoxysmal atrial fibrillation on Eliquis and recurrent syncopal episodes, now presenting to the emergency department for evaluation of severe low back pain. 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    Assessment & Plan:   Principal Problem:   Intractable low back pain Active Problems:   PAF (paroxysmal atrial fibrillation) (HCC)   Pathologic vertebral fracture   Left renal mass   Left adrenal mass (HCC)   Lung nodule seen on imaging study   Abdominal aortic aneurysm (AAA) without rupture (HCC)   Leukocytosis   Systemic inflammatory response syndrome ([place])  #1.  Systemic inflammatory response syndrome (SIRS).  Patient noted with hypothermia, SOB, tachycardia and leukocytosis.  Patient stated that he is not feeling well.  Admitted for back pain which T12 and L1 compression fractures.  Uncertain of any possible source of infection. Will obtain urine and blood cultures, chest x-ray and initiate sepsis protocol. We will start empiric antibiotics, vancomycin, cefepime and azithromycin. Will de-escalate antibiotics as soon as negative cultures x48 hours.  #2. Intractable low back pain with acute /T12 and L1 compression fractures. Patient presenting from [facility] with acute onset of low back pain. CT angiogram of the chest/abdomen/pelvis concerning for fractures involving T12 and L1 possibly pathology. Consulted neurosurgery and spoke with [facility]. Neurosurgery recommended TLSO brace and obtain upright  AP xray of spine. Recommended to d/c patient and to follow up in neurosurgery clinic. Concerns for possible myeloproliferative disease give the pathologic vertebral fracture. Obtain UPEP/SPEP and Protein electrophoresis Continue with pain management TLSO brace ordered.  #3. Left renal mass. CT angiogram showed a 6.9 x 5.6 x 7.5 left upper pole kidney mass concerning for possible renal cell carcinoma. Called and spoke with Dr. Alinda Money who is the urologist on call and he thinks that patient will not need immediate intervention while on admission. Patient will probably be followed as outpatient on discharge.  #4. Paroxysmal atrial fibrillation currently on Eliquis (CHA2DS2-VASC score =3.Rate controlled. Continue with Eliquis  #5. Leukocytosis. Patient presenting with leukocytosis of 14.9 on admission without any fever or source of infection. Possibly reactive. Will continue to monitor CBC   #6. Left lung lingula nodule 6 mm in size, incidental on CT. Patient will need outpatient follow-up with pulmonology.  #7. Left adrenal mass found on CT angiogram of abdomen and pelvis. Coupled with the left renal mass left lingula nodule and acute vertebral fracture all  pointing towards a possible neoplastic process. Outpatient follow-up with endocrinology/urology at discharge.  #8. Abdominal aortic aneurysm 3.1 cm in size and stable. No intervention needed at this time.   #9.  Syncopal episode.  Patient was reported to have passed out while driving and was taken to [facility] from where he was transferred down to [facility].  I spoke with Renee from the cardiology team following up with patient and she reported that patient had loop recorder implanted in February 2020.  Prior interrogation of loop, showed a 20 seconds pause on 23 June 2018.  The loop was placed by Dr. Rayann Heman, the electrophysiologist.  Patient's primary  cardiologist is Dr. Roxanne Mins. They plan to follow-up with  patient while an admission tomorrow.  DVT prophylaxis: Eliquis  Code Status: Full code  Family Communication: I spoke with [name] patient's spouse at [phone] and updated her on husband's condition and plan of care-about continue SIRS and plan to further work-up and continue with antibiotics.  Disposition Plan: Pending clinical improvement  Consultants:   Neurosurgery  Urology   Procedures: None  Antimicrobials: Vancomycin, cefepime and azithromycin  Subjective: Patient was seen and evaluated at bedside.  Still complaining of back pain and had to hearing.  Patient stated that he is not feeling good.  Noted white count trending up.  Had episode of tachycardia coupled with hypothermia and concerns for systemic inflammatory response syndrome.  No definitive source of any infection. Will order urine and blood culture, chest x-ray and start empiric antibiotics.  Restarted incentive coagulation Eliquis due to paroxysmal atrial fibrillation.  Patient was reported to have loop recorder implanted in February 2020 by Dr. Rayann Heman.  Objective: Vitals:   06/25/18 0559 06/25/18 2140 06/26/18 0517 06/26/18 1522  BP: 132/70 122/80 126/77 105/74  Pulse: 87 70 92 (!) 103  Resp: 18 18 18 16   Temp: (!) 97.3 F (36.3 C) 97.8 F (36.6 C) 97.6 F (36.4 C) 97.7 F (36.5 C)  TempSrc: Oral Oral Oral Oral  SpO2: 92% 94% 94% (!) 82%  Weight:      Height:        Intake/Output Summary (Last 24 hours) at 06/26/2018 1644 Last data filed at 06/26/2018 0838 Gross per 24 hour  Intake 3 ml  Output --  Net 3 ml   Filed Weights   06/23/18 2255  Weight: 87.3 kg    Examination:  General exam: Appears calm and in mild distress due to back pain.  Obese Respiratory system: Clear to auscultation. Respiratory effort normal. Cardiovascular system: S1 & S2 heard, RRR. No JVD, murmurs, rubs, gallops or clicks. No pedal edema. Gastrointestinal system: Abdomen is nondistended, soft and nontender. No  organomegaly or masses felt. Normal bowel sounds heard. Central nervous system: Alert and oriented. No focal neurological deficits.  Had to hearing Extremities: Symmetric 5 x 5 power. Skin: No rashes, lesions or ulcers Psychiatry: Judgement and insight appear normal. Mood & affect appropriate.     Data Reviewed: I have personally reviewed following labs and imaging studies  CBC: Recent Labs  Lab 06/24/18 0258 06/25/18 0259 06/26/18 0334  WBC 14.9* 17.7* 19.9*  NEUTROABS 13.0*  --   --   HGB 16.9 15.5 15.7  HCT 49.2 44.9 44.1  MCV 96.3 94.5 92.6  PLT 135* 123* 829*   Basic Metabolic Panel: Recent Labs  Lab 06/24/18 0258 06/25/18 0259 06/26/18 0334  NA 138 132* 133*  K 4.5 3.7 3.8  CL 106 99 101  CO2 19* 22 21*  GLUCOSE 121* 144* 129*  BUN 16 15 22   CREATININE 0.83 0.76 0.82  CALCIUM 8.7* 8.7* 8.8*  MG  --  1.9 1.9  PHOS  --  3.3 3.2   GFR: Estimated Creatinine Clearance: 80.9 mL/min (by C-G formula based on SCr of 0.82 mg/dL). Liver Function Tests: Recent Labs  Lab 06/25/18 0259 06/26/18 0334  AST 22 19  ALT 30 29  ALKPHOS 61 56  BILITOT 1.6* 1.3*  PROT 6.2* 6.1*  ALBUMIN 3.3* 3.1*   No results for input(s): LIPASE, AMYLASE in the last 168 hours. No results for input(s): AMMONIA in the last 168 hours. Coagulation Profile: No results for  input(s): INR, PROTIME in the last 168 hours. Cardiac Enzymes: No results for input(s): CKTOTAL, CKMB, CKMBINDEX, TROPONINI in the last 168 hours. BNP (last 3 results) No results for input(s): PROBNP in the last 8760 hours. HbA1C: No results for input(s): HGBA1C in the last 72 hours. CBG: No results for input(s): GLUCAP in the last 168 hours. Lipid Profile: No results for input(s): CHOL, HDL, LDLCALC, TRIG, CHOLHDL, LDLDIRECT in the last 72 hours. Thyroid Function Tests: No results for input(s): TSH, T4TOTAL, FREET4, T3FREE, THYROIDAB in the last 72 hours. Anemia Panel: No results for input(s): VITAMINB12, FOLATE,  FERRITIN, TIBC, IRON, RETICCTPCT in the last 72 hours. Sepsis Labs: No results for input(s): PROCALCITON, LATICACIDVEN in the last 168 hours.  No results found for this or any previous visit (from the past 240 hour(s)).       Radiology Studies: Dg Thoracolumabar Spine  Result Date: 06/25/2018 CLINICAL DATA:  T12-L1 compression fracture. EXAM: THORACOLUMBAR SPINE 1V COMPARISON:  Thoracic MRI 06/25/2018.  Chest CT 06/23/2018. FINDINGS: These views were obtained erect. Again demonstrated are acute compression fractures at T12 and L1. Both fractures demonstrate progressive loss of height compared with the CT 2 days ago, approximately 70% each. The loss of height also appears slightly greater than on the supine MRI done earlier today. No new fractures. Aortic atherosclerosis and gaseous distention of the colon, likely reflecting an ileus are noted. There is atelectasis at both lung bases and possible small pleural effusions. IMPRESSION: Progressive loss of height at the T12 and L1 fractures on this erect examination. Electronically Signed   By: Richardean Sale M.D.   On: 06/25/2018 18:37   Mr Thoracic Spine Wo Contrast  Result Date: 06/25/2018 CLINICAL DATA:  Severe low back pain and mid back pain began 06/23/2018. Patient is anticoagulated. History of syncope. EXAM: MRI THORACIC AND LUMBAR SPINE WITHOUT CONTRAST TECHNIQUE: Multiplanar and multiecho pulse sequences of the thoracic and lumbar spine were obtained without intravenous contrast. COMPARISON:  CT chest abdomen pelvis 06/23/2018 revealed a large renal mass. FINDINGS: MRI THORACIC SPINE FINDINGS Alignment:  Anatomic Vertebrae: Acute compression fracture of T12. This is T1 hypointense, T2 hypointense, and STIR hyperintense. Loss of approximately 50% vertebral body height. Retropulsed bone, greater on the LEFT, 2-3 mm. No clear involvement of the pedicles or posterior elements. Assessment for epidural tumor or paravertebral soft tissue is limited  in the absence of contrast. Cord:  Normal signal and morphology. Paraspinal and other soft tissues: LEFT renal and LEFT adrenal mass. Disc levels: No thoracic disc protrusion or spinal stenosis. MRI LUMBAR SPINE FINDINGS Segmentation:  Standard. Alignment:  Anatomic. Vertebrae: Acute L1 compression fracture, loss of approximately 50% vertebral body height. The vertebral body is T1 hypointense, T2 hypointense, and STIR hyperintense. 5 mm retropulsed fragment primarily midline. No definite involvement of the pedicles or posterior elements. In addition, there are fractures of L3, and L4 across the superior endplate, without compression deformity. Additional fracture without loss of vertebral body height is noted L5 inferiorly. Assessment for epidural tumor or paravertebral soft tissue representing tumor is limited in the absence of contrast. Conus medullaris and cauda equina: Conus extends to the mid L2 level. Conus and cauda equina appear otherwise normal. Paraspinal and other soft tissues: LEFT renal and LEFT adrenal mass. The bladder is distended. Abdominal aortic aneurysm better demonstrated on CT. Disc levels: No lumbar disc protrusion or spinal stenosis. IMPRESSION: MR THORACIC SPINE IMPRESSION Acute compression fracture, T12, as predicted from CT. Loss of approximately 50% vertebral body height. MR  LUMBAR SPINE IMPRESSION Acute compression fracture, L1, as predicted from CT. Loss of approximately 50% vertebral body height. Superior endplate fractures at L3 and L4, without frank compression. Additional inferior endplate fracture at L5, again without loss of height. No visible conus compression, or definite epidural tumor. LEFT renal and LEFT adrenal masses, better demonstrated on CT, consistent with neoplasm. Bladder distention, query outlet obstruction. 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 Post infusion imaging could be helpful however, and is recommended for further evaluation if no contraindications. Electronically Signed   By: Staci Righter M.D.   On: 06/25/2018 10:20   Mr Lumbar Spine Wo Contrast  Result Date: 06/25/2018 CLINICAL DATA:  Severe low back pain and mid back pain began 06/23/2018. Patient is anticoagulated. History of syncope. EXAM: MRI THORACIC AND LUMBAR SPINE WITHOUT CONTRAST TECHNIQUE: Multiplanar and multiecho pulse sequences of the thoracic and lumbar spine were obtained without intravenous contrast. COMPARISON:  CT chest abdomen pelvis 06/23/2018 revealed a large renal mass. FINDINGS: MRI THORACIC SPINE FINDINGS Alignment:  Anatomic Vertebrae: Acute compression fracture of T12. This is T1 hypointense, T2 hypointense, and STIR hyperintense. Loss of approximately 50% vertebral body height. Retropulsed bone, greater on the LEFT, 2-3 mm. No clear involvement of the pedicles or posterior elements. Assessment for epidural tumor or paravertebral soft tissue is limited in the absence of contrast. Cord:  Normal signal and morphology. Paraspinal and other soft tissues: LEFT renal and LEFT adrenal mass. Disc levels: No thoracic disc protrusion or spinal stenosis. MRI LUMBAR SPINE FINDINGS Segmentation:  Standard. Alignment:  Anatomic. Vertebrae: Acute L1 compression fracture, loss of approximately 50% vertebral body height. The vertebral body is T1 hypointense, T2 hypointense, and STIR hyperintense. 5 mm retropulsed fragment primarily midline. No definite involvement of the pedicles or posterior elements. In addition, there are fractures of L3, and L4 across the superior endplate, without compression deformity. Additional fracture without loss of vertebral body height is noted L5 inferiorly. Assessment for epidural tumor or paravertebral soft tissue representing tumor is limited in the absence of contrast. Conus medullaris and cauda equina: Conus extends to the mid  L2 level. Conus and cauda equina appear otherwise normal. Paraspinal and other soft tissues: LEFT renal and LEFT adrenal mass. The bladder is distended. Abdominal aortic aneurysm better demonstrated on CT. Disc levels: No lumbar disc protrusion or spinal stenosis. IMPRESSION: MR THORACIC SPINE IMPRESSION Acute compression fracture, T12, as predicted from CT. Loss of approximately 50% vertebral body height. MR LUMBAR SPINE IMPRESSION Acute compression fracture, L1, as predicted from CT. Loss of approximately 50% vertebral body height. Superior endplate fractures at L3 and L4, without frank compression. Additional inferior endplate fracture at L5, again without loss of height. No visible conus compression, or definite epidural tumor. LEFT renal and LEFT adrenal masses, better demonstrated on CT, consistent with neoplasm. Bladder distention, query outlet obstruction. 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 Post infusion imaging could be helpful however, and is recommended for further evaluation if no contraindications. Electronically Signed   By: Staci Righter M.D.   On: 06/25/2018 10:20   Dg Chest Port 1 View  Result Date: 06/26/2018 CLINICAL DATA:  78 year old male with sepsis EXAM: PORTABLE CHEST 1 VIEW COMPARISON:  06/25/2018, 06/25/2018, CT 06/23/2018 FINDINGS: Cardiomediastinal silhouette unchanged in size and contour. Unchanged event recorder on the left chest wall. Similar appearance  of low lung volumes with linear opacities at the lung bases. No pneumothorax. Paraseptal emphysematous changes again noted. No confluent airspace disease. No displaced fracture IMPRESSION: Chronic lung changes without evidence of acute cardiopulmonary disease. Electronically Signed   By: Corrie Mckusick D.O.   On: 06/26/2018 16:16   Dg Chest Port 1 View  Result Date: 06/25/2018 CLINICAL DATA:   Shortness of breath. EXAM: PORTABLE CHEST 1 VIEW COMPARISON:  06/23/2018 FINDINGS: The heart size is stable. Aeration improved since the prior study with bibasilar atelectasis remaining. There is no evidence of pulmonary edema, consolidation, pneumothorax, nodule or pleural fluid. IMPRESSION: Improved aeration with bibasilar atelectasis remaining. Electronically Signed   By: Aletta Edouard M.D.   On: 06/25/2018 08:05        Scheduled Meds:  apixaban  5 mg Oral BID   darifenacin  15 mg Oral Daily   pantoprazole  40 mg Oral Daily   sodium chloride flush  3 mL Intravenous Q12H   Continuous Infusions:  sodium chloride     azithromycin     ceFEPime (MAXIPIME) IV     sodium chloride       LOS: 0 days  Patient was initially admitted absorbs. Changed to inpatient due to ongoing systemic inflammatory response syndrome    Elie Confer, MD [facility] Pager [phone]   If 7PM-7AM, please contact night-coverage [email or web address] Password [facility] 06/26/2018, 4:44 PM

## 2018-06-26 NOTE — Telephone Encounter (Signed)
Symptom activator mailed to patient's home address on file.

## 2018-06-26 NOTE — Progress Notes (Signed)
CRITICAL VALUE ALERT  Critical Value:  Lactic acid 2.2  Date & Time Notied:  06/26/18 @ 4098  Provider Notified: MD aware  Orders Received/Actions taken: will await any new orders

## 2018-06-26 NOTE — Progress Notes (Addendum)
  Echocardiogram 2D Echocardiogram has been performed.  Extremely difficult study due to patient inability to lay in LLD position. Patient would keep falling asleep and unable to assist with breathing to better visualize LV function.   Frank [name] 06/26/2018, 5:07 PM

## 2018-06-26 NOTE — Progress Notes (Signed)
Pharmacy Antibiotic Note  [name] is a 78 y.o. male admitted on 06/23/2018 with low back pain. Starting empiric abx. SCr 0.8, eCrCl 80 ml/min.   Plan: -Vancomycin 1500 mg IV x1 then 1g/12h -Monitor renal fx, cultures, VT as needed  Height: 5\' 7"  (170.2 cm) Weight: 192 lb 7.4 oz (87.3 kg) IBW/kg (Calculated) : 66.1  Temp (24hrs), Avg:97.7 F (36.5 C), Min:97.6 F (36.4 C), Max:97.8 F (36.6 C)  Recent Labs  Lab 06/24/18 0258 06/25/18 0259 06/26/18 0334  WBC 14.9* 17.7* 19.9*  CREATININE 0.83 0.76 0.82     Antimicrobials this admission: 4/27 vancomycin > 4/27 azithromycin >  Microbiology results: 4/27 blood cx:   Harvel Quale 06/26/2018 4:42 PM

## 2018-06-27 DIAGNOSIS — T148XXA Other injury of unspecified body region, initial encounter: Secondary | ICD-10-CM

## 2018-06-27 DIAGNOSIS — I495 Sick sinus syndrome: Secondary | ICD-10-CM

## 2018-06-27 DIAGNOSIS — I480 Paroxysmal atrial fibrillation: Secondary | ICD-10-CM

## 2018-06-27 DIAGNOSIS — I714 Abdominal aortic aneurysm, without rupture: Secondary | ICD-10-CM

## 2018-06-27 DIAGNOSIS — N2889 Other specified disorders of kidney and ureter: Secondary | ICD-10-CM

## 2018-06-27 DIAGNOSIS — D72829 Elevated white blood cell count, unspecified: Secondary | ICD-10-CM

## 2018-06-27 DIAGNOSIS — R55 Syncope and collapse: Secondary | ICD-10-CM

## 2018-06-27 LAB — CBC
HCT: 41.5 % (ref 39.0–52.0)
Hemoglobin: 14.5 g/dL (ref 13.0–17.0)
MCH: 33.1 pg (ref 26.0–34.0)
MCHC: 34.9 g/dL (ref 30.0–36.0)
MCV: 94.7 fL (ref 80.0–100.0)
Platelets: 129 10*3/uL — ABNORMAL LOW (ref 150–400)
RBC: 4.38 MIL/uL (ref 4.22–5.81)
RDW: 14.5 % (ref 11.5–15.5)
WBC: 14.3 10*3/uL — ABNORMAL HIGH (ref 4.0–10.5)
nRBC: 0 % (ref 0.0–0.2)

## 2018-06-27 LAB — COMPREHENSIVE METABOLIC PANEL
ALT: 30 U/L (ref 0–44)
AST: 19 U/L (ref 15–41)
Albumin: 2.7 g/dL — ABNORMAL LOW (ref 3.5–5.0)
Alkaline Phosphatase: 45 U/L (ref 38–126)
Anion gap: 10 (ref 5–15)
BUN: 20 mg/dL (ref 8–23)
CO2: 22 mmol/L (ref 22–32)
Calcium: 7.8 mg/dL — ABNORMAL LOW (ref 8.9–10.3)
Chloride: 102 mmol/L (ref 98–111)
Creatinine, Ser: 0.9 mg/dL (ref 0.61–1.24)
GFR calc Af Amer: >60 mL/min (ref >60)
GFR calc non Af Amer: >60 mL/min (ref >60)
Glucose, Bld: 97 mg/dL (ref 70–99)
Potassium: 3.7 mmol/L (ref 3.5–5.1)
Sodium: 134 mmol/L — ABNORMAL LOW (ref 135–145)
Total Bilirubin: 1 mg/dL (ref 0.3–1.2)
Total Protein: 5.4 g/dL — ABNORMAL LOW (ref 6.5–8.1)

## 2018-06-27 LAB — TROPONIN I
Troponin I: <0.03 ng/mL (ref <0.03)
Troponin I: <0.03 ng/mL (ref <0.03)

## 2018-06-27 LAB — PHOSPHORUS: Phosphorus: 2.7 mg/dL (ref 2.5–4.6)

## 2018-06-27 LAB — MAGNESIUM: Magnesium: 1.8 mg/dL (ref 1.7–2.4)

## 2018-06-27 LAB — C-REACTIVE PROTEIN: CRP: 7.3 mg/dL — ABNORMAL HIGH (ref <1.0)

## 2018-06-27 LAB — SEDIMENTATION RATE: Sed Rate: 14 mm/hr (ref 0–16)

## 2018-06-27 MED ORDER — METOPROLOL TARTRATE 25 MG PO TABS
25.0000 mg | ORAL_TABLET | Freq: Two times a day (BID) | ORAL | Status: DC
  Administered 2018-06-27: 25 mg via ORAL
  Filled 2018-06-27: qty 1

## 2018-06-27 MED ORDER — CYCLOBENZAPRINE HCL 5 MG PO TABS
5.0000 mg | ORAL_TABLET | Freq: Three times a day (TID) | ORAL | Status: DC | PRN
  Administered 2018-06-27: 5 mg via ORAL
  Filled 2018-06-27: qty 1
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
## 2018-06-27 NOTE — Evaluation (Signed)
Physical Therapy Evaluation Patient Details Name: [name] MRN: [id number] DOB: 10-29-1940 Today's Date: 06/27/2018   History of Present Illness  78 y.o.malewith medical history significant forparoxysmal atrial fibrillation on Eliquis and recurrent syncopal episodes, now presenting to the emergency department for evaluation of severe low back pain.  Clinical Impression  Pt admitted with above. Pt confused and unsure why he doesn't feel good. Pt with noted back spasms as pt writhing in pain. Pt did sit EOB and stand x2 however unable to step to the chair due to pain. Per chart pt was indep PTA however patient now requiring assist with all mobility and greatly limited by pain and spasms. Acute PT to cont to follow.    Follow Up Recommendations SNF;Supervision/Assistance - 24 hour    Equipment Recommendations  None recommended by PT(TBD at next venue)    Recommendations for Other Services       Precautions / Restrictions Precautions Precautions: Fall;Back Precaution Booklet Issued: No Precaution Comments: pt confused stating "I don't feel good but I forget why" winsing in pain from back spasms Required Braces or Orthoses: Spinal Brace Spinal Brace: Applied in sitting position;Thoracolumbosacral orthotic Restrictions Weight Bearing Restrictions: No      Mobility  Bed Mobility Overal bed mobility: Needs Assistance Bed Mobility: Rolling;Sidelying to Sit;Sit to Sidelying Rolling: Min assist Sidelying to sit: Min assist     Sit to sidelying: Min assist General bed mobility comments: pt initiated all tasks however required assist to complete them due to pain  Transfers Overall transfer level: Needs assistance Equipment used: Rolling walker (2 wheeled) Transfers: Sit to/from Stand Sit to Stand: Min assist;Mod assist         General transfer comment: max directional verbal cues, pt quickly powers up however pulls up on RW despite v/c's to push up from bed, pt then  states he is in pain, complete 2 trials  Ambulation/Gait             General Gait Details: attempted to step to chair however patient unable due to pains  Stairs            Wheelchair Mobility    Modified Rankin (Stroke Patients Only)       Balance Overall balance assessment: Needs assistance Sitting-balance support: Feet supported;Bilateral upper extremity supported Sitting balance-Leahy Scale: Fair Sitting balance - Comments: dependent on UEs due to back pain   Standing balance support: Bilateral upper extremity supported Standing balance-Leahy Scale: Poor Standing balance comment: dependent on RW                             Pertinent Vitals/Pain Pain Assessment: Faces Faces Pain Scale: Hurts worst Pain Location: appears back, pt stating it hurts so bad Pain Descriptors / Indicators: Spasm Pain Intervention(s): Monitored during session    Home Living Family/patient expects to be discharged to:: Private residence Living Arrangements: Spouse/significant other Available Help at Discharge: Family;Available 24 hours/day Type of Home: House Home Access: Stairs to enter Entrance Stairs-Rails: None Entrance Stairs-Number of Steps: 1 Home Layout: Two level;Bed/bath upstairs   Additional Comments: info taken from recent admission, pt poor historian and unable to provide PLOF or home set up    Prior Function Level of Independence: Independent         Comments: was driving (per previous admission)     Hand Dominance        Extremity/Trunk Assessment   Upper Extremity Assessment Upper Extremity Assessment:  Generalized weakness    Lower Extremity Assessment Lower Extremity Assessment: Generalized weakness    Cervical / Trunk Assessment Cervical / Trunk Assessment: Other exceptions Cervical / Trunk Exceptions: back fracture  Communication   Communication: HOH  Cognition Arousal/Alertness: Awake/alert Behavior During Therapy:  Restless Overall Cognitive Status: Impaired/Different from baseline Area of Impairment: Orientation;Attention;Following commands;Safety/judgement;Awareness;Problem solving                 Orientation Level: Disoriented to;Place;Time;Situation Current Attention Level: Focused   Following Commands: Follows one step commands inconsistently;Follows one step commands with increased time Safety/Judgement: Decreased awareness of deficits;Decreased awareness of safety Awareness: Intellectual Problem Solving: Slow processing;Difficulty sequencing;Requires verbal cues;Requires tactile cues General Comments: pt very confused, pt asking "is it broken", "i can't remember why I dont feel good"  pt with fear of pain as well limiting willingness to move      General Comments General comments (skin integrity, edema, etc.): VSS    Exercises     Assessment/Plan    PT Assessment Patient needs continued PT services  PT Problem List Decreased strength;Decreased range of motion;Decreased activity tolerance;Decreased balance;Decreased mobility;Decreased coordination;Decreased cognition;Decreased knowledge of use of DME;Decreased safety awareness;Pain       PT Treatment Interventions DME instruction;Stair training;Gait training;Functional mobility training;Therapeutic exercise;Therapeutic activities;Balance training;Neuromuscular re-education;Cognitive remediation;Patient/family education    PT Goals (Current goals can be found in the Care Plan section)  Acute Rehab PT Goals PT Goal Formulation: Patient unable to participate in goal setting Time For Goal Achievement: 07/11/18 Potential to Achieve Goals: Good    Frequency Min 3X/week   Barriers to discharge        Co-evaluation               AM-PAC PT "6 Clicks" Mobility  Outcome Measure Help needed turning from your back to your side while in a flat bed without using bedrails?: A Little Help needed moving from lying on your back to  sitting on the side of a flat bed without using bedrails?: A Little Help needed moving to and from a bed to a chair (including a wheelchair)?: A Lot Help needed standing up from a chair using your arms (e.g., wheelchair or bedside chair)?: A Lot Help needed to walk in hospital room?: A Lot Help needed climbing 3-5 steps with a railing? : A Lot 6 Click Score: 14    End of Session Equipment Utilized During Treatment: Gait belt;Back brace Activity Tolerance: Patient limited by pain Patient left: in bed;with call bell/phone within reach;with bed alarm set Nurse Communication: Mobility status PT Visit Diagnosis: Unsteadiness on feet (R26.81);Pain;Difficulty in walking, not elsewhere classified (R26.2) Pain - part of body: (back)    Time: 8315-1761 PT Time Calculation (min) (ACUTE ONLY): 19 min   Charges:   PT Evaluation $PT Eval Moderate Complexity: 1 Mod         Kittie Plater, PT, DPT Acute Rehabilitation Services Pager #: [phone] Office #: [phone]   Berline Lopes 06/27/2018, 5:26 PM

## 2018-06-27 NOTE — Progress Notes (Signed)
PROGRESS NOTE    [name]  JXB:[id number] DOB: 07/03/1940 DOA: 06/23/2018 PCP: Frank Lima, MD    Brief Narrative:  78 y.o.malewith medical history significant forparoxysmal atrial fibrillation on Eliquis and recurrent syncopal episodes, now presenting to the emergency department for evaluation of severe low back pain. 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  Assessment & Plan:   Principal Problem:   Intractable low back pain Active Problems:   PAF (paroxysmal atrial fibrillation) (HCC)   Pathologic vertebral fracture   Left renal mass   Left adrenal mass (HCC)   Lung nodule seen on imaging study   Abdominal aortic aneurysm (AAA) without rupture (HCC)   Leukocytosis   Systemic inflammatory response syndrome ([place])  #1.  Systemic inflammatory response syndrome (SIRS).   -Patient noted to be hypothermia, SOB, tachycardia and leukocytosis.   -Presented with back pain which T12 and L1 compression fractures. -No source of active infection -Cultures have been negative. CXR unremarkable, UA clear -Pt had been on empiric broad spectrum abx. Will discontinue for now  #2. Intractable low back pain with acute /T12 and L1 compression fractures. -Patient presenting from [facility] with acute onset of low back pain. -CT angiogram of the chest/abdomen/pelvis reviewed, findings notable for fractures involving T12 and L1 possibly pathology.  -Case was earlier discussed with neurosurgerywho recommended TLSO brace and obtain upright AP xray of spine. Recommended to d/c patient and to follow up in neurosurgery clinic. -Question of possible myeloproliferative disease give the pathologic vertebral  fracture. -Obtain UPEP pending -SPEP neg for m-spike -Continue with pain management -complaining of back spasms this AM. Will give trial of muscle relaxant  #3. Left renal mass.  -CT angiogram showed a 6.9 x 5.6 x 7.5 left upper pole kidney mass concerning for possible renal cell carcinoma. -Case was earlier discussed with Frank [name] recommended close outpatient follow up.  #4. Paroxysmal atrial fibrillation currently on Eliquis (CHA2DS2-VASC score =3. -Rate controlled. -Continue with Eliquis as tolerated  #5. Leukocytosis.  -Patient presenting with leukocytosis of14.9on admission without any fever or source of infection.  -Likely reactive. -Repeat CBC  #6. Left lung lingula nodule 6 mm in size, incidental on CT. -Patient will need outpatient follow-up with pulmonology.  #7. Left adrenal mass found on CT angiogram of abdomen and pelvis. -Coupled with the left renal mass left lingula nodule and acute vertebral fractureallpointing towards a possibleneoplastic process. -Recommend outpatient follow-up withendocrinology/urology at discharge.  #8. Abdominal aortic aneurysm 3.1 cm in size and stable. -No intervention needed at this time. -Stable at this time   #9.  Syncopal episode.   -Patient was reported to have passed out while driving and was taken to [facility] from where he was transferred down to [facility].   -Frank [name] discussed with spoke with Frank [name] from the cardiology team following up with patient and she reported that patient had loop recorder implanted in February 2020.  Prior interrogation of loop, showed a 20 seconds pause on 23 June 2018.  The loop was placed by Frank. Rayann [name], the electrophysiologist.  Patient's primary cardiologist is Frank. Roxanne [name]. -Discussed with Frank [name] this AM, recommendation to hold off on further workup at this time unless recurrence   DVT prophylaxis: eliquis Code Status: Full  Family Communication: Pt in  room, family not at bedside Disposition Plan: uncertain at this time  Consultants:   Neurosurgery  Urology  EP/Cardiology  Procedures:     Antimicrobials: Anti-infectives (From admission, onward)   Start     Dose/Rate Route Frequency Ordered Stop   06/27/18 0600  vancomycin (VANCOCIN) IVPB 750 mg/150 ml premix  Status:  Discontinued     750 mg 150 mL/hr over 60 Minutes Intravenous Every 12 hours 06/26/18 1722 06/27/18 0924   06/26/18 1800  vancomycin (VANCOCIN) 1,500 mg in sodium chloride 0.9 % 500 mL IVPB     1,500 mg 250 mL/hr over 120 Minutes Intravenous  Once 06/26/18 1722 06/26/18 2055   06/26/18 1700  ceFEPIme (MAXIPIME) 1 g in sodium chloride 0.9 % 100 mL IVPB  Status:  Discontinued     1 g 200 mL/hr over 30 Minutes Intravenous Every 12 hours 06/26/18 1552 06/27/18 0924   06/26/18 1700  azithromycin (ZITHROMAX) 500 mg in sodium chloride 0.9 % 250 mL IVPB  Status:  Discontinued     500 mg 250 mL/hr over 60 Minutes Intravenous Every 24 hours 06/26/18 1608 06/27/18 0924       Subjective: Complaining of back spasms at this time  Objective: Vitals:   06/26/18 1745 06/26/18 2145 06/27/18 0614 06/27/18 1133  BP:  134/70 120/81   Pulse:  69 95 86  Resp:  13    Temp:  97.8 F (36.6 C) 98.3 F (36.8 C)   TempSrc:  Oral Oral   SpO2: 97% 95% 96% 96%  Weight:      Height:        Intake/Output Summary (Last 24 hours) at 06/27/2018 1437 Last data filed at 06/27/2018 1104 Gross per 24 hour  Intake 850 ml  Output 1200 ml  Net -350 ml   Filed Weights   06/23/18 2255  Weight: 87.3 kg    Examination: General exam: Appears calm and comfortable  Respiratory system: Clear to auscultation. Respiratory effort normal. Cardiovascular system: S1 & S2 heard, RRR Gastrointestinal system: Abdomen is nondistended, soft and nontender. No organomegaly or masses felt. Normal bowel sounds heard. Central nervous system: Alert and oriented. No focal neurological deficits.  Extremities: Symmetric 5 x 5 power. Skin: No rashes, lesions Psychiatry: Judgement and insight appear normal. Mood & affect appropriate.   Data Reviewed: I have personally reviewed following labs and imaging studies  CBC: Recent Labs  Lab 06/24/18 0258 06/25/18 0259 06/26/18 0334 06/26/18 1638 06/27/18 0618  WBC 14.9* 17.7* 19.9* 19.0* 14.3*  NEUTROABS 13.0*  --   --  16.4*  --   HGB 16.9 15.5 15.7 15.6 14.5  HCT 49.2 44.9 44.1 44.4 41.5  MCV 96.3 94.5 92.6 94.1 94.7  PLT 135* 123* 120* 135* 694*   Basic Metabolic Panel: Recent Labs  Lab 06/24/18 0258 06/25/18 0259 06/26/18 0334 06/26/18 1638 06/27/18 0618  NA 138 132* 133* 133* 134*  K 4.5 3.7 3.8 4.4 3.7  CL 106 99 101 95* 102  CO2 19* 22 21* 25 22  GLUCOSE 121* 144* 129* 117* 97  BUN 16 15 22  24* 20  CREATININE 0.83 0.76 0.82 0.94 0.90  CALCIUM 8.7* 8.7* 8.8* 8.9 7.8*  MG  --  1.9 1.9  --  1.8  PHOS  --  3.3 3.2  --  2.7   GFR: Estimated Creatinine Clearance: 73.7 mL/min (by C-G formula based on SCr of 0.9 mg/dL). Liver Function Tests: Recent Labs  Lab 06/25/18 0259 06/26/18 0334  06/26/18 1638 06/27/18 0618  AST 22 19 21 19   ALT 30 29 31 30   ALKPHOS 61 56 55 45  BILITOT 1.6* 1.3* 0.9 1.0  PROT 6.2* 6.1* 6.1* 5.4*  ALBUMIN 3.3* 3.1* 3.1* 2.7*   No results for input(s): LIPASE, AMYLASE in the last 168 hours. No results for input(s): AMMONIA in the last 168 hours. Coagulation Profile: Recent Labs  Lab 06/26/18 1638  INR 1.2   Cardiac Enzymes: Recent Labs  Lab 06/26/18 1832 06/27/18 0014 06/27/18 0618  TROPONINI 0.03* <0.03 <0.03   BNP (last 3 results) No results for input(s): PROBNP in the last 8760 hours. HbA1C: No results for input(s): HGBA1C in the last 72 hours. CBG: No results for input(s): GLUCAP in the last 168 hours. Lipid Profile: No results for input(s): CHOL, HDL, LDLCALC, TRIG, CHOLHDL, LDLDIRECT in the last 72 hours. Thyroid Function Tests: No results for input(s): TSH,  T4TOTAL, FREET4, T3FREE, THYROIDAB in the last 72 hours. Anemia Panel: No results for input(s): VITAMINB12, FOLATE, FERRITIN, TIBC, IRON, RETICCTPCT in the last 72 hours. Sepsis Labs: Recent Labs  Lab 06/26/18 1638 06/26/18 1816  PROCALCITON 0.26  --   LATICACIDVEN 2.6* 2.2*    Recent Results (from the past 240 hour(s))  Culture, blood (x 2)     Status: None (Preliminary result)   Collection Time: 06/26/18  4:00 PM  Result Value Ref Range Status   Specimen Description BLOOD RIGHT ANTECUBITAL  Final   Special Requests   Final    BOTTLES DRAWN AEROBIC ONLY Blood Culture adequate volume   Culture   Final    NO GROWTH < 24 HOURS Performed at [facility], [address] [place], [place] [address]    Report Status PENDING  Incomplete  Culture, blood (x 2)     Status: None (Preliminary result)   Collection Time: 06/26/18  4:30 PM  Result Value Ref Range Status   Specimen Description BLOOD RIGHT ARM  Final   Special Requests   Final    BOTTLES DRAWN AEROBIC ONLY Blood Culture results may not be optimal due to an inadequate volume of blood received in culture bottles   Culture   Final    NO GROWTH < 24 HOURS Performed at [facility], [place] [address], [place], [place] [address]    Report Status PENDING  Incomplete     Radiology Studies: Dg Thoracolumabar Spine  Result Date: 06/25/2018 CLINICAL DATA:  T12-L1 compression fracture. EXAM: THORACOLUMBAR SPINE 1V COMPARISON:  Thoracic MRI 06/25/2018.  Chest CT 06/23/2018. FINDINGS: These views were obtained erect. Again demonstrated are acute compression fractures at T12 and L1. Both fractures demonstrate progressive loss of height compared with the CT 2 days ago, approximately 70% each. The loss of height also appears slightly greater than on the supine MRI done earlier today. No new fractures. Aortic atherosclerosis and gaseous distention of the colon, likely reflecting an ileus are noted. There is atelectasis at both lung  bases and possible small pleural effusions. IMPRESSION: Progressive loss of height at the T12 and L1 fractures on this erect examination. Electronically Signed   By: Richardean Sale M.D.   On: 06/25/2018 18:37   Dg Chest Port 1 View  Result Date: 06/26/2018 CLINICAL DATA:  78 year old male with sepsis EXAM: PORTABLE CHEST 1 VIEW COMPARISON:  06/25/2018, 06/25/2018, CT 06/23/2018 FINDINGS: Cardiomediastinal silhouette unchanged in size and contour. Unchanged event recorder on the left chest wall. Similar appearance of low lung volumes with linear opacities at  the lung bases. No pneumothorax. Paraseptal emphysematous changes again noted. No confluent airspace disease. No displaced fracture IMPRESSION: Chronic lung changes without evidence of acute cardiopulmonary disease. Electronically Signed   By: Corrie Mckusick D.O.   On: 06/26/2018 16:16    Scheduled Meds: . apixaban  5 mg Oral BID  . darifenacin  15 mg Oral Daily  . pantoprazole  40 mg Oral Daily  . sodium chloride flush  3 mL Intravenous Q12H   Continuous Infusions: . sodium chloride    . sodium chloride Stopped (06/27/18 2080)     LOS: 1 day   Marylu Lund, MD [facility] Pager On Amion  If 7PM-7AM, please contact night-coverage 06/27/2018, 2:37 PM

## 2018-06-28 ENCOUNTER — Telehealth: Payer: Self-pay

## 2018-06-28 ENCOUNTER — Inpatient Hospital Stay (HOSPITAL_COMMUNITY): Payer: Medicare Other

## 2018-06-28 MED ORDER — IOHEXOL 300 MG/ML  SOLN
100.0000 mL | Freq: Once | INTRAMUSCULAR | Status: AC | PRN
  Administered 2018-06-28: 100 mL via INTRAVENOUS

## 2018-06-28 NOTE — Discharge Instructions (Addendum)
NO DRIVING  PLEASE SIGN UP TO YOUR MY CHART ACCOUNT WHEN YOU GET HOME (if you aren't already).  IN THE CURRENT ENVIRONMENT WITH COVID-19, IN EFFORT TO REDUCE YOUR EXPOSURE WE WILL BE CONDUCTING MANY PATIENT VISITS BY EITHER VIRTUAL/VIDEO VISITS or TELEPHONE VISITS.  BEING SIGNED UP IN YOUR MY CHART ACCOUNT  WILL HELP FACILITATE THESE VISITS AND OUR COMMUNICATION WITH YOU   YOUR CARDIOLOGY TEAM HAS ARRANGED FOR AN E-VISIT FOR YOUR APPOINTMENT - PLEASE REVIEW IMPORTANT INFORMATION BELOW SEVERAL DAYS PRIOR TO YOUR APPOINTMENT  Due to the recent COVID-19 pandemic, we are transitioning in-person office visits to tele-medicine visits in an effort to decrease unnecessary exposure to our patients, their families, and staff. These visits are billed to your insurance just like a normal visit is. We also encourage you to sign up for MyChart if you have not already done so. You will need a smartphone if possible. For patients that do not have this, we can still complete the visit using a regular telephone but do prefer a smartphone to enable video when possible. You may have a family member that lives with you that can help. If possible, we also ask that you have a blood pressure cuff and scale at home to measure your blood pressure, heart rate and weight prior to your scheduled appointment. Patients with clinical needs that need an in-person evaluation and testing will still be able to come to the office if absolutely necessary. If you have any questions, feel free to call our office.     YOUR PROVIDER WILL BE USING THE FOLLOWING PLATFORM TO COMPLETE YOUR VISIT:  [place]    IF USING [place] - How to Download the MyChart App to Your SmartPhone   - If Apple, go to [employer] and type in MyChart in the search bar and download the app. If Android, ask patient to go to [employer] and type in [place] in the search bar and download the app. The app is free but as with any other app downloads, your phone may  require you to verify saved payment information or Apple/Android password.  - You will need to then log into the app with your MyChart username and password, and select [facility] as your healthcare provider to link the account.  - When it is time for your visit, go to the MyChart app, find appointments, and click Begin Video Visit. Be sure to Select Allow for your device to access the Microphone and Camera for your visit. You will then be connected, and your provider will be with you shortly.  **If you have any issues connecting or need assistance, please contact MyChart service desk [phone])**  **If using a computer, in order to ensure the best quality for your visit, you will need to use either of the following Internet Browsers: Insurance underwriter or Microsoft Edge**   IF USING DOXIMITY or [email or web address] - The staff will give you instructions on receiving your link to join the meeting the day of your visit.      2-3 DAYS BEFORE YOUR APPOINTMENT  You will receive a telephone call from one of our [facility] team members - your caller ID may say "Unknown caller." If this is a video visit, we will walk you through how to get the video launched on your phone. We will remind you check your blood pressure, heart rate and weight prior to your scheduled appointment. If you have an Apple Watch or Jodelle Red, please upload any pertinent ECG  strips the day before or morning of your appointment to [facility]. Our staff will also make sure you have reviewed the consent and agree to move forward with your scheduled tele-health visit.     THE DAY OF YOUR APPOINTMENT  Approximately 15 minutes prior to your scheduled appointment, you will receive a telephone call from one of [facility] team - your caller ID may say "Unknown caller."  Our staff will confirm medications, vital signs for the day and any symptoms you may be experiencing. Please have this information available prior to the time of visit start. It  may also be helpful for you to have a pad of paper and pen handy for any instructions given during your visit. They will also walk you through joining the smartphone meeting if this is a video visit.    CONSENT FOR TELE-HEALTH VISIT - PLEASE REVIEW  I hereby voluntarily request, consent and authorize [facility] and its employed or contracted physicians, physician assistants, nurse practitioners or other licensed health care professionals (the Practitioner), to provide me with telemedicine health care services (the Services") as deemed necessary by the treating Practitioner. I acknowledge and consent to receive the Services by the Practitioner via telemedicine. I understand that the telemedicine visit will involve communicating with the Practitioner through live audiovisual communication technology and the disclosure of certain medical information by electronic transmission. I acknowledge that I have been given the opportunity to request an in-person assessment or other available alternative prior to the telemedicine visit and am voluntarily participating in the telemedicine visit.  I understand that I have the right to withhold or withdraw my consent to the use of telemedicine in the course of my care at any time, without affecting my right to future care or treatment, and that the Practitioner or I may terminate the telemedicine visit at any time. I understand that I have the right to inspect all information obtained and/or recorded in the course of the telemedicine visit and may receive copies of available information for a reasonable fee.  I understand that some of the potential risks of receiving the Services via telemedicine include:   Delay or interruption in medical evaluation due to technological equipment failure or disruption;  Information transmitted may not be sufficient (e.g. poor resolution of images) to allow for appropriate medical decision making by the Practitioner; and/or   In  rare instances, security protocols could fail, causing a breach of personal health information.  Furthermore, I acknowledge that it is my responsibility to provide information about my medical history, conditions and care that is complete and accurate to the best of my ability. I acknowledge that Practitioner's advice, recommendations, and/or decision may be based on factors not within their control, such as incomplete or inaccurate data provided by me or distortions of diagnostic images or specimens that may result from electronic transmissions. I understand that the practice of medicine is not an exact science and that Practitioner makes no warranties or guarantees regarding treatment outcomes. I acknowledge that I will receive a copy of this consent concurrently upon execution via email to the email address I last provided but may also request a printed copy by calling the office of [facility].    I understand that my insurance will be billed for this visit.   I have read or had this consent read to me.  I understand the contents of this consent, which adequately explains the benefits and risks of the Services being provided via telemedicine.   I have  been provided ample opportunity to ask questions regarding this consent and the Services and have had my questions answered to my satisfaction.  I give my informed consent for the services to be provided through the use of telemedicine in my medical care  By participating in this telemedicine visit I agree to the above.     Information on my medicine - ELIQUIS (apixaban)  This medication education was reviewed with me or my healthcare representative as part of my discharge preparation.    Why was Eliquis prescribed for you? Eliquis was prescribed for you to reduce the risk of a blood clot forming that can cause a stroke if you have a medical condition called atrial fibrillation (a type of irregular heartbeat).  What do You need to  know about Eliquis ? Take your Eliquis TWICE DAILY - one tablet in the morning and one tablet in the evening with or without food. If you have difficulty swallowing the tablet whole please discuss with your pharmacist how to take the medication safely.  Take Eliquis exactly as prescribed by your doctor and DO NOT stop taking Eliquis without talking to the doctor who prescribed the medication.  Stopping may increase your risk of developing a stroke.  Refill your prescription before you run out.  After discharge, you should have regular check-up appointments with your healthcare provider that is prescribing your Eliquis.  In the future your dose may need to be changed if your kidney function or weight changes by a significant amount or as you get older.  What do you do if you miss a dose? If you miss a dose, take it as soon as you remember on the same day and resume taking twice daily.  Do not take more than one dose of ELIQUIS at the same time to make up a missed dose.  Important Safety Information A possible side effect of Eliquis is bleeding. You should call your healthcare provider right away if you experience any of the following: ? Bleeding from an injury or your nose that does not stop. ? Unusual colored urine (red or dark brown) or unusual colored stools (red or black). ? Unusual bruising for unknown reasons. ? A serious fall or if you hit your head (even if there is no bleeding).  Some medicines may interact with Eliquis and might increase your risk of bleeding or clotting while on Eliquis. To help avoid this, consult your healthcare provider or pharmacist prior to using any new prescription or non-prescription medications, including herbals, vitamins, non-steroidal anti-inflammatory drugs (NSAIDs) and supplements.  This website has more information on Eliquis (apixaban): http://www.eliquis.com/eliquis/home

## 2018-06-28 NOTE — Progress Notes (Signed)
PROGRESS NOTE  [name] FOY:[id number] DOB: [birth year] DOA: 06/23/2018 PCP: [name], MD   LOS: 2 days   Brief Narrative / Interim history: 78 year old male history of paroxysmal A. fib on Eliquis, recurrent syncopal episodes with a loop recorder in place and noted a pause, admitted to the hospital for low back pain after having a fall.  CT scan on admission at the outside hospital showed some compression fractures T12 and L1, possibly pathologic.  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  Subjective: Patient has no complaints this morning, denies any chest pain, denies any shortness of breath.  Wants to go home.  Assessment & Plan: Principal Problem:   Intractable low back pain Active Problems:   PAF (paroxysmal atrial fibrillation) (HCC)   Pathologic vertebral fracture   Left renal mass   Left adrenal mass (HCC)   Lung nodule seen on imaging study   Abdominal aortic aneurysm (AAA) without rupture (HCC)   Leukocytosis   Systemic inflammatory response syndrome (HCC)   Principal Problem SIRS -Concern for infection on admission with hypothermia, tachycardia, increased white count.  He did have back pain with T12 and L1 compression fractures which can also cause leukocytosis.  His cultures are negative to date, chest x-ray unremarkable, urinalysis without evidence of infection.  He initially was placed on broad-spectrum antibiotics but this has now been discontinued since 4/28 -Remains afebrile and white count seems to be improving today  Active Problems Syncopal episode with reportedly 10 second pause on June 23, 2018 -EP following, considering PPM implantation.  Left renal mass /left adrenal mass -Initially discussions were with Dr. Alinda Money, however EP raise concern about MRI requirements and that could not be done within the first 2 months after PPM placement.  I could not reach Dr. Alinda Money  today, discussed with Dr. Gloriann Loan the on-call urologist who recommended a CT scan with and without contrast which he ordered.  It appears that it is OK from urology standpoint for PPM placement if needed  Low back pain with acute T12/L1 compression fractures -Case was discussed with neurosurgery who recommended DLCO brace, follow-up as an outpatient in their office -?  Related to the renal mass  Paroxysmal A. fib currently on Eliquis -Rate controlled, continue Eliquis  Left lung lingular nodule 6 mm in size, incidental -Outpatient follow-up  Abdominal aortic aneurysm, 3.1 cm  -outpatient follow-up   Scheduled Meds: . apixaban  5 mg Oral BID  . darifenacin  15 mg Oral Daily  . pantoprazole  40 mg Oral Daily  . sodium chloride flush  3 mL Intravenous Q12H   Continuous Infusions: . sodium chloride     PRN Meds:.sodium chloride, acetaminophen **OR** acetaminophen, bisacodyl, HYDROcodone-acetaminophen, HYDROmorphone (DILAUDID) injection, ondansetron **OR** ondansetron (ZOFRAN) IV, polyethylene glycol, sodium chloride flush  DVT prophylaxis: Eliquis Code Status: Full code Family Communication: no family at bedside, wife over the phone  Disposition Plan: home when ready   Consultants:   EP  Procedures:   None   Antimicrobials:  Vanco, cefepime, azithromycin discontinued on 4/28  Objective: Vitals:   06/27/18 1133 06/27/18 1502 06/27/18 2153 06/28/18 0555  BP:  (!) 181/92 133/82 121/76  Pulse: 86 91 80 79  Resp:  20 18   Temp:  98.3 F (36.8 C) (!) 97.5 F (36.4 C) 97.8 F (36.6 C)  TempSrc:  Oral Oral Oral  SpO2: 96% 93% 99% 96%  Weight:  Height:        Intake/Output Summary (Last 24 hours) at 06/28/2018 1037 Last data filed at 06/28/2018 0731 Gross per 24 hour  Intake 3 ml  Output 1750 ml  Net -1747 ml   Filed Weights   06/23/18 2255  Weight: 87.3 kg    Examination:  Constitutional: NAD, HOH Eyes: PERRL, lids and conjunctivae normal ENMT: Mucous  membranes are moist. Respiratory: clear to auscultation bilaterally, no wheezing, no crackles.  Cardiovascular: Regular rate and rhythm, no murmurs / rubs / gallops. No LE edema. Abdomen: no tenderness. Bowel sounds positive.  Musculoskeletal: no clubbing / cyanosis.  Skin: no rashes Neurologic: CN 2-12 grossly intact. Strength 5/5 in all 4.  Psychiatric: Normal judgment and insight.   Data Reviewed: I have independently reviewed following labs and imaging studies   CBC: Recent Labs  Lab 06/24/18 0258 06/25/18 0259 06/26/18 0334 06/26/18 1638 06/27/18 0618  WBC 14.9* 17.7* 19.9* 19.0* 14.3*  NEUTROABS 13.0*  --   --  16.4*  --   HGB 16.9 15.5 15.7 15.6 14.5  HCT 49.2 44.9 44.1 44.4 41.5  MCV 96.3 94.5 92.6 94.1 94.7  PLT 135* 123* 120* 135* 563*   Basic Metabolic Panel: Recent Labs  Lab 06/24/18 0258 06/25/18 0259 06/26/18 0334 06/26/18 1638 06/27/18 0618  NA 138 132* 133* 133* 134*  K 4.5 3.7 3.8 4.4 3.7  CL 106 99 101 95* 102  CO2 19* 22 21* 25 22  GLUCOSE 121* 144* 129* 117* 97  BUN 16 15 22  24* 20  CREATININE 0.83 0.76 0.82 0.94 0.90  CALCIUM 8.7* 8.7* 8.8* 8.9 7.8*  MG  --  1.9 1.9  --  1.8  PHOS  --  3.3 3.2  --  2.7   GFR: Estimated Creatinine Clearance: 73.7 mL/min (by C-G formula based on SCr of 0.9 mg/dL). Liver Function Tests: Recent Labs  Lab 06/25/18 0259 06/26/18 0334 06/26/18 1638 06/27/18 0618  AST 22 19 21 19   ALT 30 29 31 30   ALKPHOS 61 56 55 45  BILITOT 1.6* 1.3* 0.9 1.0  PROT 6.2* 6.1* 6.1* 5.4*  ALBUMIN 3.3* 3.1* 3.1* 2.7*   No results for input(s): LIPASE, AMYLASE in the last 168 hours. No results for input(s): AMMONIA in the last 168 hours. Coagulation Profile: Recent Labs  Lab 06/26/18 1638  INR 1.2   Cardiac Enzymes: Recent Labs  Lab 06/26/18 1832 06/27/18 0014 06/27/18 0618  TROPONINI 0.03* <0.03 <0.03   BNP (last 3 results) No results for input(s): PROBNP in the last 8760 hours. HbA1C: No results for input(s):  HGBA1C in the last 72 hours. CBG: No results for input(s): GLUCAP in the last 168 hours. Lipid Profile: No results for input(s): CHOL, HDL, LDLCALC, TRIG, CHOLHDL, LDLDIRECT in the last 72 hours. Thyroid Function Tests: No results for input(s): TSH, T4TOTAL, FREET4, T3FREE, THYROIDAB in the last 72 hours. Anemia Panel: No results for input(s): VITAMINB12, FOLATE, FERRITIN, TIBC, IRON, RETICCTPCT in the last 72 hours. Urine analysis:    Component Value Date/Time   COLORURINE YELLOW 06/26/2018 [place] 06/26/2018 1840   LABSPEC 1.028 06/26/2018 1840   PHURINE 6.0 06/26/2018 1840   GLUCOSEU NEGATIVE 06/26/2018 1840   GLUCOSEU NEGATIVE 11/28/2012 1001   HGBUR NEGATIVE 06/26/2018 1840   BILIRUBINUR NEGATIVE 06/26/2018 1840   BILIRUBINUR neg 02/20/2015 1133   KETONESUR NEGATIVE 06/26/2018 1840   PROTEINUR NEGATIVE 06/26/2018 1840   UROBILINOGEN 0.2 02/20/2015 1133   UROBILINOGEN 0.2 11/28/2012 1001  NITRITE NEGATIVE 06/26/2018 1840   LEUKOCYTESUR NEGATIVE 06/26/2018 1840   Sepsis Labs: Invalid input(s): PROCALCITONIN, LACTICIDVEN  Recent Results (from the past 240 hour(s))  Culture, blood (x 2)     Status: None (Preliminary result)   Collection Time: 06/26/18  4:00 PM  Result Value Ref Range Status   Specimen Description BLOOD RIGHT ANTECUBITAL  Final   Special Requests   Final    BOTTLES DRAWN AEROBIC ONLY Blood Culture adequate volume   Culture   Final    NO GROWTH < 24 HOURS Performed at [facility], [address] [place], [place] [address]    Report Status PENDING  Incomplete  Culture, blood (x 2)     Status: None (Preliminary result)   Collection Time: 06/26/18  4:30 PM  Result Value Ref Range Status   Specimen Description BLOOD RIGHT ARM  Final   Special Requests   Final    BOTTLES DRAWN AEROBIC ONLY Blood Culture results may not be optimal due to an inadequate volume of blood received in culture bottles   Culture   Final    NO GROWTH < 24  HOURS Performed at [facility], [place] [address] [place], [place] [address]    Report Status PENDING  Incomplete      Radiology Studies: Dg Chest Port 1 View  Result Date: 06/26/2018 CLINICAL DATA:  78 year old male with sepsis EXAM: PORTABLE CHEST 1 VIEW COMPARISON:  06/25/2018, 06/25/2018, CT 06/23/2018 FINDINGS: Cardiomediastinal silhouette unchanged in size and contour. Unchanged event recorder on the left chest wall. Similar appearance of low lung volumes with linear opacities at the lung bases. No pneumothorax. Paraseptal emphysematous changes again noted. No confluent airspace disease. No displaced fracture IMPRESSION: Chronic lung changes without evidence of acute cardiopulmonary disease. Electronically Signed   By: Corrie Mckusick D.O.   On: 06/26/2018 16:16     Marzetta Board, MD, PhD [facility]  Contact via  [email or web address]  [facility] P: [phone]  F: [phone]

## 2018-06-28 NOTE — Consult Note (Signed)
H&P Physician requesting consult: Marzetta Board, MD  Chief Complaint: Left renal and adrenal mass  History of Present Illness: 78 year old male with a history of atrial fibrillation on Eliquis as well as recurrent syncopal episodes is currently admitted to the hospital after a fall.  At the outside hospital underwent a CTA of the chest, abdomen, pelvis.  This was to rule out aortic dissection.  This revealed a large left renal mass approximately 7.5 cm that was highly irregular.  He also had an associated mass in the left adrenal gland.  He was also found to have compression fractures of T12 and L1 thought to be possibly pathologic.  He subsequent underwent an MRI of the spine that was notable for likely posttraumatic rather than pathologic fractures.  There was no chest lymphadenopathy on the CTA.  The patient is being evaluated for possible pacemaker placement.  He denies hematuria or dysuria.  Creatinine is 0.9 with a GFR greater than 60.  He does have mild leukocytosis at 14.3 which is down from 19.  His lactic acid was also initially elevated.  Urinalysis was negative.  He subsequent underwent a CT renal mass protocol.  This revealed a 48mm nodule within the adrenal gland with enhancement concerning for malignancy.  This also redemonstrated an enhancing solid left renal mass that was 6.1 x 5.4 x 5.7 cm with extension beyond the renal cortex but appears to be contained within the pararenal fascia.  The left renal vein appeared normal.  Right kidney was normal.  There was no retroperitoneal lymphadenopathy.  Patient does admit to some back pain.  He denies hematuria dysuria.  Past Medical History:  Diagnosis Date  . Diverticulosis   . Esophageal reflux   . Esophageal stricture   . HA (headache)   . Hearing loss   . Hiatal hernia   . Hypertriglyceridemia   . Hypoglycemia   . Obesity, unspecified   . PAF (paroxysmal atrial fibrillation) (HCC)    Echo with normal LV function and mild LVH  .  Syncope    presented with atrial fib converted with Diltiazem   Past Surgical History:  Procedure Laterality Date  . LOOP RECORDER INSERTION N/A 04/26/2018   Procedure: LOOP RECORDER INSERTION;  Surgeon: Thompson Grayer, MD;  Location: [facility];  Service: Cardiovascular;  Laterality: N/A;  . NASAL SEPTUM SURGERY    . TONSILLECTOMY    . UMBILICAL HERNIA REPAIR  1993  . WISDOM TOOTH EXTRACTION     about 78 years old    Home Medications:  Medications Prior to Admission  Medication Sig Dispense Refill Last Dose  . ELIQUIS 5 MG TABS tablet Take 1 tablet by mouth twice daily 60 tablet 5 06/23/2018 at 0800  . esomeprazole (NEXIUM) 40 MG capsule Take 1 capsule (40 mg total) by mouth daily. (Patient taking differently: Take 40 mg by mouth daily before breakfast. ) 30 capsule 11 06/23/2018 at 0800  . gabapentin (NEURONTIN) 300 MG capsule Take 300 mg by mouth See admin instructions. Take one capsule by mouth at bedtime for one week then increase to twice a day as needed for nerve pain   06/23/2018 at Unknown time  . omega-3 acid ethyl esters (LOVAZA) 1 g capsule Take 2 capsules by mouth twice daily 360 capsule 0 06/23/2018 at Unknown time  . predniSONE (DELTASONE) 5 MG tablet Take 5 mg by mouth See admin instructions. Follow directions package for dose pack for 12 days   06/23/2018 at Unknown time  . solifenacin (  VESICARE) 10 MG tablet Take 1 tablet (10 mg total) by mouth daily. 90 tablet 1 06/23/2018 at Unknown time  . atorvastatin (LIPITOR) 20 MG tablet Take 1 tablet (20 mg total) by mouth daily. (Patient not taking: Reported on 06/24/2018) 90 tablet 1 Not Taking at Unknown time   Allergies:  Allergies  Allergen Reactions  . Codeine Other (See Comments)    Passed out  . Monosodium Glutamate Other (See Comments)    Caused headaches    Family History  Problem Relation Age of Onset  . Emphysema Father   . Gallbladder disease Sister   . Bipolar disorder Sister   . Bipolar disorder Paternal  Grandmother   . Colon cancer Neg Hx    Social History:  reports that he quit smoking about 31 years ago. He has never used smokeless tobacco. He reports that he does not drink alcohol or use drugs.  ROS: A complete review of systems was performed.  All systems are negative except for pertinent findings as noted. ROS   Physical Exam:  Vital signs in last 24 hours: Temp:  [97.5 F (36.4 C)-98.6 F (37 C)] 98.6 F (37 C) (04/29 1214) Pulse Rate:  [79-96] 96 (04/29 1214) Resp:  [18-19] 19 (04/29 1214) BP: (121-133)/(76-82) 126/80 (04/29 1214) SpO2:  [96 %-99 %] 98 % (04/29 1214) General:  Alert and oriented, No acute distress HEENT: Normocephalic, atraumatic Neck: No JVD or lymphadenopathy Cardiovascular: Regular rate and rhythm Lungs: Regular rate and effort Abdomen: Soft, nontender, nondistended, no abdominal masses, obese Back: No CVA tenderness Extremities: No edema Neurologic: Grossly intact  Laboratory Data:  No results found for this or any previous visit (from the past 24 hour(s)). Recent Results (from the past 240 hour(s))  Culture, blood (x 2)     Status: None (Preliminary result)   Collection Time: 06/26/18  4:00 PM  Result Value Ref Range Status   Specimen Description BLOOD RIGHT ANTECUBITAL  Final   Special Requests   Final    BOTTLES DRAWN AEROBIC ONLY Blood Culture adequate volume   Culture   Final    NO GROWTH 2 DAYS Performed at [facility], [address] [place], [place] [address]    Report Status PENDING  Incomplete  Culture, blood (x 2)     Status: None (Preliminary result)   Collection Time: 06/26/18  4:30 PM  Result Value Ref Range Status   Specimen Description BLOOD RIGHT ARM  Final   Special Requests   Final    BOTTLES DRAWN AEROBIC ONLY Blood Culture results may not be optimal due to an inadequate volume of blood received in culture bottles   Culture   Final    NO GROWTH 2 DAYS Performed at [facility], [place] [address]  [place], [place] [address]    Report Status PENDING  Incomplete   Creatinine: Recent Labs    06/24/18 0258 06/25/18 0259 06/26/18 0334 06/26/18 1638 06/27/18 0618  CREATININE 0.83 0.76 0.82 0.94 0.90   CTA, MRI of the lumbar spine, and CT renal mass protocol were personally reviewed and are detailed in the history of present illness  Impression/Assessment:  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  He would like to think about this.  He will need cardiology clearance.  If possible, please obtain cardiology clearance while in the hospital but  if unable, it can be done on outpatient basis.  We will plan to set him up for an outpatient appointment to discuss further.  I do not think that he needs an MRI.  Marton Redwood, III 06/28/2018, 4:36 PM

## 2018-06-28 NOTE — TOC Initial Note (Signed)
Transition of Care (TOC) - Initial/Assessment Note    Patient Details  Name: [name] MRN: [id number] Date of Birth: 1940-04-19  [facility]) CM/SW Contact:    Benard Halsted, LCSW Phone Number: 06/28/2018, 10:28 AM  Clinical Narrative:                 78 y.o.malewith medical history significant forparoxysmal atrial fibrillation on Eliquis and recurrent syncopal episodes, now presenting for evaluation of severe low back pain.  CSW received consult for possible SNF placement at time of discharge. CSW spoke with patient regarding PT recommendation of SNF placement at time of discharge. Patient reported that she would prefer for patient to come home since she has been taking care of him. She states she does not feel he would do well at SNF away from family. He has been going to outpatient PT at [facility] for his shoulder. She is accepting of home health services and requests PT/OT/ and an aide. She also requests a walker and bedside commode. She has prepared a bedroom downstairs for him. CSW discussed insurance authorization process and provided Medicare Home Health ratings list. Patient expressed being hopeful for rehab and to feel better soon. No further questions reported at this time. CSW to continue to follow and assist with discharge planning needs.   Expected Discharge Plan: [place] Barriers to Discharge: Continued Medical Work up   Patient Goals and CMS Choice Patient states their goals for this hospitalization and ongoing recovery are:: Return home CMS Medicare.gov Compare Post Acute Care list provided to:: Other (Comment Required)(Spouse) Choice offered to / list presented to : Spouse  Expected Discharge Plan and Services Expected Discharge Plan: [place] In-house Referral: Clinical Social Work   Post Acute Care Choice: [place] arrangements for the past 2 months: [place]                                       Prior Living Arrangements/Services Living arrangements for the past 2 months: Single Family Home Lives with:: Spouse Patient language and need for interpreter reviewed:: Yes Do you feel safe going back to the place where you live?: Yes      Need for Family Participation in Patient Care: Yes (Comment) Care giver support system in place?: Yes (comment) Current home services: Other (comment)(Outpatient PT) Criminal Activity/Legal Involvement Pertinent to Current Situation/Hospitalization: No - Comment as needed  Activities of Daily Living Home Assistive Devices/Equipment: None ADL Screening (condition at time of admission) Patient's cognitive ability adequate to safely complete daily activities?: Yes Is the patient deaf or have difficulty hearing?: No(Difficulty hearing. Pt didn't bring hearing aids) Does the patient have difficulty seeing, even when wearing glasses/contacts?: No Does the patient have difficulty concentrating, remembering, or making decisions?: No Patient able to express need for assistance with ADLs?: Yes Does the patient have difficulty dressing or bathing?: No Independently performs ADLs?: Yes (appropriate for developmental age)(Pre-hospitalization) Does the patient have difficulty walking or climbing stairs?: No(Pre-hospitalization) Weakness of Legs: None Weakness of Arms/Hands: None  Permission Sought/Granted Permission sought to share information with : Family Supports Permission granted to share information with : Yes, Verbal Permission Granted  Share Information with NAME: Frank [name]  Permission granted to share info w AGENCY: [place] granted to share info w Relationship: Spouse  Permission granted to share info  w Contact Information: [phone]  Emotional Assessment Appearance:: Appears stated age Attitude/Demeanor/Rapport: Unable to Assess Affect (typically observed): Unable to Assess Orientation: : Oriented to  Self, Oriented to Place Alcohol / Substance Use: Not Applicable Psych Involvement: No (comment)  Admission diagnosis:  renal cell carcinoma, 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 Systemic inflammatory response syndrome ([place]) 06/26/2018  . Intractable low back pain 06/23/2018  . Pathologic vertebral fracture 06/23/2018  . Left renal mass 06/23/2018  . Left adrenal mass ([place]) 06/23/2018  . Lung nodule seen on imaging study 06/23/2018  . Abdominal aortic aneurysm (AAA) without rupture ([place]) 06/23/2018  . Leukocytosis 06/23/2018  . Essential hypertension 05/01/2018  . Recurrent syncope 04/24/2018  . History of anaphylaxis 04/07/2016  . Bradycardia 03/22/2016  . OAB (overactive bladder) 01/06/2016  . Erectile dysfunction due to arterial insufficiency 04/23/2015  . Hyperlipidemia with target LDL less than 70 11/28/2012  . Routine general medical examination at a health care facility 11/28/2012  . Meralgia paresthetica of left side 09/21/2012  . PAF (paroxysmal atrial fibrillation) ([place]) 04/16/2011  . GERD (gastroesophageal reflux disease) 11/10/2010  . BPH (benign prostatic hyperplasia) 01/19/2010  . HYPERTRIGLYCERIDEMIA 11/22/2007   PCP:  [name], MD Pharmacy:   [facility] [address], [place] [address] Phone: [phone] Fax: [place], [place] - [address] [place]  [address] Phone: [phone] Fax: [place], [place] - [address] [place] [address] Phone: [phone] Fax: [phone]     Social Determinants of Health (SDOH) Interventions    Readmission Risk Interventions Readmission Risk Prevention Plan 06/28/2018  Post Dischage Appt Complete  Medication Screening Complete  Transportation Screening Complete  Some recent  data might be hidden

## 2018-06-28 NOTE — Progress Notes (Signed)
Discussed with attending MD, no MRI's planned for further w/u on masses, likely to discharge in the next day or 2, so far infection w/u negative, afebrile.  Telemetry reviewed.  The patient has converted to SR, no post termination pause, no bradycardia is noted here while he has been on tele.   Discussed with Dr. Rayann Heman.   Will need completeion of cultures negative, and prefer to see him clinically improved/recovered from this.  Will arrange an early EP follow up (virtual) to discuss/plan PPM implant.  Instruct no driving on discharge Return/call EMS for any recurrent fainting  Tommye Standard, PA-C

## 2018-06-28 NOTE — Progress Notes (Signed)
Physical Therapy Treatment Note  Patient seen for mobility progression. Pt is disoriented to time and situation and perseverating on "I didn't know I had back pain". Unable to progress to gait training this session but pt  agreeable to OOB transfer. Current plan remains appropriate.    06/28/18 1122  PT Visit Information  Last PT Received On 06/28/18  Assistance Needed +1 (+2 for ambulation)  History of Present Illness 77 y.o.malewith medical history significant forparoxysmal atrial fibrillation on Eliquis and recurrent syncopal episodes, now presenting to the emergency department for evaluation of severe low back pain.  Precautions  Precautions Fall;Back  Precaution Comments precautions reviewed; pt requires cues to maintain throughout  Required Braces or Orthoses Spinal Brace  Spinal Brace Applied in sitting position;TLSO  Pain Assessment  Pain Assessment Faces  Faces Pain Scale 6  Pain Location back  Pain Descriptors / Indicators Grimacing;Guarding;Sore  Pain Intervention(s) Limited activity within patient's tolerance;Monitored during session;Repositioned;Patient requesting pain meds-RN notified  Cognition  Arousal/Alertness Awake/alert  Behavior During Therapy Restless  Overall Cognitive Status No family/caregiver present to determine baseline cognitive functioning  Area of Impairment Orientation;Attention;Following commands;Safety/judgement;Awareness;Problem solving;Memory  Orientation Level Disoriented to;Time;Situation (states hes at the hospital; reports he is here for his heart)  Current Attention Level Sustained  Memory Decreased recall of precautions;Decreased short-term memory  Following Commands Follows one step commands inconsistently;Follows one step commands with increased time  Safety/Judgement Decreased awareness of deficits;Decreased awareness of safety  Awareness Intellectual  Problem Solving Slow processing;Difficulty sequencing;Requires verbal cues;Requires  tactile cues  General Comments pt perseverating on "I didn't know I had back problems until I sat up" and asking about brace multiple times during session  Bed Mobility  Overal bed mobility Needs Assistance  Bed Mobility Rolling;Sidelying to Sit  Rolling Min assist  Sidelying to sit Min assist  General bed mobility comments cues for sequencing; assist to maintain precautions with mobility  Transfers  Overall transfer level Needs assistance  Equipment used Rolling walker (2 wheeled)  Transfers Sit to/from Stand;Stand Pivot Transfers  Sit to Stand Min assist  Stand pivot transfers Min assist  General transfer comment pt requires assist to power up and to steady/guide RW when pivoting   Ambulation/Gait  General Gait Details pt declined attempt to ambulate  Balance  Overall balance assessment Needs assistance  Sitting-balance support Feet supported;Bilateral upper extremity supported  Sitting balance-Leahy Scale Fair  Sitting balance - Comments dependent on UEs due to back pain  Standing balance support Bilateral upper extremity supported  Standing balance-Leahy Scale Poor  Standing balance comment dependent on RW  General Comments  General comments (skin integrity, edema, etc.) no c/o dizziness/lightheadedness  PT - End of Session  Equipment Utilized During Treatment Gait belt;Back brace  Activity Tolerance Patient limited by pain  Patient left with call bell/phone within reach;in chair;with chair alarm set  Nurse Communication Mobility status   PT - Assessment/Plan  PT Plan Current plan remains appropriate  PT Visit Diagnosis Unsteadiness on feet (R26.81);Pain;Difficulty in walking, not elsewhere classified (R26.2)  Pain - part of body  (back)  PT Frequency (ACUTE ONLY) Min 3X/week  Follow Up Recommendations SNF;Supervision/Assistance - 24 hour  PT equipment Rolling walker with 5" wheels  AM-PAC PT "6 Clicks" Mobility Outcome Measure (Version 2)  Help needed turning from your  back to your side while in a flat bed without using bedrails? 3  Help needed moving from lying on your back to sitting on the side of a flat bed without using bedrails?  3  Help needed moving to and from a bed to a chair (including a wheelchair)? 3  Help needed standing up from a chair using your arms (e.g., wheelchair or bedside chair)? 3  Help needed to walk in hospital room? 2  Help needed climbing 3-5 steps with a railing?  2  6 Click Score 16  Consider Recommendation of Discharge To: Home with HH  PT Goal Progression  Progress towards PT goals Progressing toward goals  PT Time Calculation  PT Start Time (ACUTE ONLY) 1047  PT Stop Time (ACUTE ONLY) 1113  PT Time Calculation (min) (ACUTE ONLY) 26 min  PT General Charges  $$ ACUTE PT VISIT 1 Visit  PT Treatments  $Gait Training 8-22 mins  $Therapeutic Activity 8-22 mins   Earney Navy, PTA Acute Rehabilitation Services Pager: [phone] Office: [phone]

## 2018-06-29 LAB — CBC
HCT: 36.8 % — ABNORMAL LOW (ref 39.0–52.0)
Hemoglobin: 13 g/dL (ref 13.0–17.0)
MCH: 32.8 pg (ref 26.0–34.0)
MCHC: 35.3 g/dL (ref 30.0–36.0)
MCV: 92.9 fL (ref 80.0–100.0)
Platelets: 125 10*3/uL — ABNORMAL LOW (ref 150–400)
RBC: 3.96 MIL/uL — ABNORMAL LOW (ref 4.22–5.81)
RDW: 14.4 % (ref 11.5–15.5)
WBC: 8.4 10*3/uL (ref 4.0–10.5)
nRBC: 0 % (ref 0.0–0.2)

## 2018-06-29 MED ORDER — HYDROCODONE-ACETAMINOPHEN 5-325 MG PO TABS: 1.0000 | ORAL_TABLET | ORAL | 0 refills | Status: AC | PRN

## 2018-06-29 NOTE — Evaluation (Signed)
Occupational Therapy Evaluation Patient Details Name: [name] MRN: [id number] DOB: March 30, 1940 Today's Date: 06/29/2018    History of Present Illness 78 y.o.malewith medical history significant forparoxysmal atrial fibrillation on Eliquis and recurrent syncopal episodes, now presenting to the emergency department for evaluation of severe low back pain.   Clinical Impression   Pt reports being independent in self care and ambulation PTA. He states he no longer drives. His wife manages IADL including his medications and medical appointments. Pt presents with moderate back pain, generalized weakness and decreased balance. Pt requires min to max assist for ADL and total assist to don TLSO. Took pt's picture with his cell phone so wife would know how to don his back brace correctly. Pt impulsive with poor safety awareness. He is reliant on RW and min assist for short distance ambulation. Pt could benefit from post acute rehab in SNF, but per SW note, wife prefers to take him home.   Follow Up Recommendations  Home health OT;Supervision/Assistance - 24 hour(Wife is declining SNF for rehab)    Equipment Recommendations  3 in 1 bedside commode    Recommendations for Other Services       Precautions / Restrictions Precautions Precautions: Fall;Back Precaution Booklet Issued: No Precaution Comments: precautions reviewed; pt requires cues to maintain throughout Required Braces or Orthoses: Spinal Brace Spinal Brace: Applied in sitting position;Thoracolumbosacral orthotic Restrictions Weight Bearing Restrictions: No      Mobility Bed Mobility Overal bed mobility: Needs Assistance Bed Mobility: Rolling;Sidelying to Sit Rolling: Supervision Sidelying to sit: Supervision       General bed mobility comments: cues for log roll technique, HOB up  Transfers Overall transfer level: Needs assistance Equipment used: Rolling walker (2 wheeled) Transfers: Sit to/from Stand Sit to  Stand: Min assist         General transfer comment: min assist to rise and steady, cues for hand placement, pt impulsive    Balance Overall balance assessment: Needs assistance   Sitting balance-Leahy Scale: Fair Sitting balance - Comments: dependent on at least one UE due to back pain   Standing balance support: Bilateral upper extremity supported Standing balance-Leahy Scale: Poor Standing balance comment: dependent on RW                           ADL either performed or assessed with clinical judgement   ADL Overall ADL's : Needs assistance/impaired Eating/Feeding: Set up;Sitting Eating/Feeding Details (indicate cue type and reason): assist to open containers and for condiments Grooming: Wash/dry hands;Sitting;Set up   Upper Body Bathing: Minimal assistance;Sitting   Lower Body Bathing: Maximal assistance;Sit to/from stand   Upper Body Dressing : Minimal assistance;Sitting;Total assistance(total assist for TLSO)   Lower Body Dressing: Maximal assistance;Sit to/from stand   Toilet Transfer: Minimal assistance;Ambulation;RW   Toileting- Clothing Manipulation and Hygiene: Minimal assistance;Sit to/from stand       Functional mobility during ADLs: Minimal assistance;Rolling walker       Vision Baseline Vision/History: Wears glasses Patient Visual Report: No change from baseline       Perception     Praxis      Pertinent Vitals/Pain Pain Assessment: Faces Faces Pain Scale: Hurts even more Pain Location: back Pain Descriptors / Indicators: Grimacing;Guarding;Sore Pain Intervention(s): Patient requesting pain meds-RN notified;Repositioned     Hand Dominance Right   Extremity/Trunk Assessment Upper Extremity Assessment Upper Extremity Assessment: Overall WFL for tasks assessed   Lower Extremity Assessment Lower Extremity Assessment: Defer to  PT evaluation   Cervical / Trunk Assessment Cervical / Trunk Assessment: Other exceptions Cervical /  Trunk Exceptions: back fracture   Communication Communication Communication: HOH   Cognition Arousal/Alertness: Awake/alert Behavior During Therapy: Impulsive Overall Cognitive Status: No family/caregiver present to determine baseline cognitive functioning Area of Impairment: Orientation;Attention;Following commands;Safety/judgement;Awareness;Problem solving;Memory                 Orientation Level: Disoriented to;Time;Situation Current Attention Level: Sustained Memory: Decreased recall of precautions;Decreased short-term memory Following Commands: Follows one step commands with increased time Safety/Judgement: Decreased awareness of deficits;Decreased awareness of safety Awareness: Intellectual Problem Solving: Slow processing;Difficulty sequencing;Requires verbal cues;Requires tactile cues General Comments: pt aware he was admitted for back pain and that he is not supposed to get up without his back brace   General Comments       Exercises     Shoulder Instructions      Home Living Family/patient expects to be discharged to:: Private residence Living Arrangements: Spouse/significant other Available Help at Discharge: Family;Available 24 hours/day Type of Home: House Home Access: Stairs to enter [employer] of Steps: 1 Entrance Stairs-Rails: None Home Layout: Two level;Bed/bath upstairs Alternate Level Stairs-Number of Steps: Flight Alternate Level Stairs-Rails: Right           Home Equipment: None   Additional Comments: info taken from recent admission, pt poor historian and unable to provide PLOF or home set up      Prior Functioning/Environment Level of Independence: Independent        Comments: was not driving anymore, reports he was digging moss out of his pond when he hurt his back        OT Problem List: Decreased strength;Decreased activity tolerance;Impaired balance (sitting and/or standing);Decreased cognition;Decreased safety  awareness;Decreased knowledge of use of DME or AE;Pain      OT Treatment/Interventions: Self-care/ADL training;DME and/or AE instruction;Patient/family education;Visual/perceptual remediation/compensation;Therapeutic activities    OT Goals(Current goals can be found in the care plan section) Acute Rehab OT Goals Patient Stated Goal: to return home  OT Frequency: Min 2X/week   Barriers to D/C:            Co-evaluation              AM-PAC OT "6 Clicks" Daily Activity     Outcome Measure Help from another person eating meals?: A Little Help from another person taking care of personal grooming?: A Little Help from another person toileting, which includes using toliet, bedpan, or urinal?: A Lot Help from another person bathing (including washing, rinsing, drying)?: A Lot Help from another person to put on and taking off regular upper body clothing?: A Lot Help from another person to put on and taking off regular lower body clothing?: A Lot 6 Click Score: 14   End of Session Equipment Utilized During Treatment: Gait belt;Rolling walker;Back brace Nurse Communication: Patient requests pain meds  Activity Tolerance: Patient tolerated treatment well Patient left: in chair;with call bell/phone within reach;with chair alarm set  OT Visit Diagnosis: Unsteadiness on feet (R26.81);Other abnormalities of gait and mobility (R26.89);Pain;Other symptoms and signs involving cognitive function                Time: 1113-1140 OT Time Calculation (min): 27 min Charges:  OT General Charges $OT Visit: 1 Visit OT Evaluation $OT Eval Moderate Complexity: 1 Mod OT Treatments $Self Care/Home Management : 8-22 mins  Nestor Lewandowsky, OTR/L Acute Rehabilitation Services Pager: [phone] Office: [phone]  Malka So 06/29/2018, 12:17 PM

## 2018-06-30 ENCOUNTER — Telehealth: Payer: Self-pay | Admitting: Internal Medicine

## 2018-06-30 DIAGNOSIS — I714 Abdominal aortic aneurysm, without rupture: Secondary | ICD-10-CM | POA: Diagnosis not present

## 2018-06-30 DIAGNOSIS — S32010D Wedge compression fracture of first lumbar vertebra, subsequent encounter for fracture with routine healing: Secondary | ICD-10-CM | POA: Diagnosis not present

## 2018-06-30 DIAGNOSIS — E278 Other specified disorders of adrenal gland: Secondary | ICD-10-CM | POA: Diagnosis not present

## 2018-06-30 DIAGNOSIS — K219 Gastro-esophageal reflux disease without esophagitis: Secondary | ICD-10-CM | POA: Diagnosis not present

## 2018-06-30 DIAGNOSIS — N2889 Other specified disorders of kidney and ureter: Secondary | ICD-10-CM | POA: Diagnosis not present

## 2018-06-30 DIAGNOSIS — S22080D Wedge compression fracture of T11-T12 vertebra, subsequent encounter for fracture with routine healing: Secondary | ICD-10-CM | POA: Diagnosis not present

## 2018-06-30 DIAGNOSIS — W19XXXD Unspecified fall, subsequent encounter: Secondary | ICD-10-CM | POA: Diagnosis not present

## 2018-06-30 DIAGNOSIS — Z95818 Presence of other cardiac implants and grafts: Secondary | ICD-10-CM | POA: Diagnosis not present

## 2018-06-30 DIAGNOSIS — I480 Paroxysmal atrial fibrillation: Secondary | ICD-10-CM | POA: Diagnosis not present

## 2018-06-30 DIAGNOSIS — Z87891 Personal history of nicotine dependence: Secondary | ICD-10-CM | POA: Diagnosis not present

## 2018-06-30 NOTE — Telephone Encounter (Signed)
Copied from [place] [id number]. Topic: Quick Communication - Home Health Verbal Orders >> Jun 30, 2018  3:18 PM Berneta Levins wrote: Caller/Agency: Ria Comment from [place] Number: [phone], [place] to leave a message Requesting OT/PT/Skilled Nursing/Social Work/Speech Therapy: skilled nursing Frequency: 1x a week for one week, 2x a week for one week, 1x a week for 2 weeks.

## 2018-06-30 NOTE — Discharge Summary (Signed)
Physician Discharge Summary  [name] WJX:[id number] DOB: [birth year] DOA: 06/23/2018  PCP: [name], MD  Admit date: 06/23/2018 Discharge date: 06/30/2018  Admitted From: home Disposition:  Home with HHPT (refused SNF)  Recommendations for Outpatient Follow-up:  1. Follow up with PCP in 1-2 weeks, please monitor finalized blood cultures 2. Follow-up with cardiology as scheduled 3. Follow-up with urology in 2 to 3 weeks  Home Health: PT, OT Equipment/Devices: None  Discharge Condition: Stable CODE STATUS: Full code Diet recommendation: Heart healthy  HPI: Per admitting MD, [name] is a 78 y.o. male with medical history significant for paroxysmal atrial fibrillation on Eliquis and recurrent syncopal episodes, now presenting to the emergency department for evaluation of severe low back pain.  Patient reports that he has been undergoing outpatient evaluations for pain in his neck with radiation to the arm, but this had been stable and he had otherwise been doing well until just after eating lunch today when he developed acute onset of severe pain in his mid to lower back.  There was no preceding trauma or inciting factor identified and he had never experienced this type of pain previously.  He denies any abdominal pain, chest pain, cough, or shortness of breath.  He denies any fevers or chills.  He denies any swelling or tenderness involving the lower extremities.   ED Course: Upon arrival to the ED, patient is found to be afebrile, saturating adequately on room air, and with stable blood pressure.  Chemistry panel was unremarkable and CBC notable for leukocytosis to 20,400.  Lactic acid is reassuringly normal and troponin was undetectable.  Urinalysis notable for trace blood.  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  Also noted on the CT are compression  fractures involving T12 and L1, possibly pathologic, AAA measuring up to 3.1 cm, centrilobular emphysema, and a 6 mm lingular nodule.  Patient was treated with IV analgesics in the ED, and the ED physician arranged transfer to [facility] for admission.  Hospital Course: Principal Problem SIRS -Concern for infection on admission with hypothermia, tachycardia, increased white count.  He did have back pain and was found to have T12 and L1 compression fractures which can also cause leukocytosis.  His cultures are negative to date, chest x-ray unremarkable, urinalysis without evidence of infection.  He initially was placed on broad-spectrum antibiotics which were discontinued, he was monitored off antibiotics, has remained afebrile and his white count has normalized.  Final cultures are pending at time of discharge  Active Problems Syncopal episode with reportedly 10 second pause on June 23, 2018 -EP consulted and followed patient while hospitalized, he converted to sinus rhythm on telemetry and had no pauses while hospitalized.  They recommend outpatient follow-up which will be arranged by the EP team.  Left renal mass /left adrenal mass -Urology consulted and evaluated patient while hospitalized.  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  Urology would like cardiology clearance prior to that, and hopefully this can be done at the next cardiology visit  Low back pain with acute T12/L1 compression fractures -Case was discussed with neurosurgery who recommended DLCO brace, follow-up as an outpatient in their office  Paroxysmal A. fib currently on Eliquis -Rate controlled, continue Eliquis  Left lung lingular nodule 6 mm in size, incidental -Outpatient follow-up  Abdominal aortic aneurysm, 3.1 cm  -  outpatient follow-up  Discharge Diagnoses:  Principal Problem:   Intractable low back pain Active Problems:   PAF (paroxysmal atrial fibrillation)  (HCC)   Pathologic vertebral fracture   Left renal mass   Left adrenal mass (HCC)   Lung nodule seen on imaging study   Abdominal aortic aneurysm (AAA) without rupture (HCC)   Leukocytosis   Systemic inflammatory response syndrome (HCC)     Discharge Instructions   Allergies as of 06/29/2018      Reactions   Codeine Other (See Comments)   Passed out   Monosodium Glutamate Other (See Comments)   Caused headaches      Medication List    STOP taking these medications   predniSONE 5 MG tablet Commonly known as:  DELTASONE     TAKE these medications   atorvastatin 20 MG tablet Commonly known as:  LIPITOR Take 1 tablet (20 mg total) by mouth daily.   Eliquis 5 MG Tabs tablet Generic drug:  apixaban Take 1 tablet by mouth twice daily   esomeprazole 40 MG capsule Commonly known as:  NEXIUM Take 1 capsule (40 mg total) by mouth daily. What changed:  when to take this   gabapentin 300 MG capsule Commonly known as:  NEURONTIN Take 300 mg by mouth See admin instructions. Take one capsule by mouth at bedtime for one week then increase to twice a day as needed for nerve pain   HYDROcodone-acetaminophen 5-325 MG tablet Commonly known as:  NORCO/VICODIN Take 1 tablet by mouth every 4 (four) hours as needed for up to 5 days for moderate pain.   omega-3 acid ethyl esters 1 g capsule Commonly known as:  LOVAZA Take 2 capsules by mouth twice daily   solifenacin 10 MG tablet Commonly known as:  VESICARE Take 1 tablet (10 mg total) by mouth daily.      Follow-up Information    [facility].   Specialty:  Home Health Services Why:  home health services arranged       [facility] Follow up.   Why:  rolling walker and BSC will be delivered to bedside prior to discharge       Allred, Jeneen Rinks, MD Follow up.   Specialty:  Cardiology Why:  07/10/2018 @ 11:00AM, this will be a video/electronic visit.  Please see your discharge  instructions Contact information: [address] [address] [phone]        Lucas Mallow, MD. Schedule an appointment as soon as possible for a visit in 2 week(s).   Specialty:  Urology Contact information: [place] [phone]           Consultations:  Urology  Neurosurgery  Cardiology/EP  Procedures/Studies:  Ct Abdomen Pelvis W Wo Contrast  Result Date: 06/28/2018 CLINICAL DATA:  RIGHT renal mass EXAM: CT ABDOMEN AND PELVIS WITHOUT AND WITH CONTRAST TECHNIQUE: Multidetector CT imaging of the abdomen and pelvis was performed following the standard protocol before and following the bolus administration of intravenous contrast. CONTRAST:  172mL OMNIPAQUE IOHEXOL 300 MG/ML  SOLN COMPARISON:  CT 06/23/2018 FINDINGS: Lower chest: Small bilateral pleural effusions and passive atelectasis. Hepatobiliary: No focal hepatic lesion. No biliary duct dilatation. Gallbladder is normal. Common bile duct is normal. Pancreas: Pancreas is normal. No ductal dilatation. No pancreatic inflammation. Spleen: Normal spleen Adrenals/urinary tract: 16 mm nodule of the lateral limb LEFT adrenal gland has avid enhancement. Lesion does not meet washout criteria for benign  adenoma. Lobular mass occupying the upper pole of the LEFT kidney is solid and enhancing consistent with renal neoplasm. No macroscopic fat within lesion on noncontrast exam. Lesion measures 6.1 by 5.4 by 5.7 cm. There is lobular extension beyond the renal cortex however the lesion appears contained within the pararenal fascia. The LEFT renal vein has normal enhancement pattern. Benign nonenhancing cysts extending from the lower pole of the LEFT kidney. RIGHT kidney normal. No retroperitoneal lymphadenopathy. Stomach/Bowel: Stomach, small bowel, appendix, and cecum are normal. Multiple diverticula of the descending colon and sigmoid colon without acute inflammation.  Vascular/Lymphatic: Abdominal aorta is normal caliber with atherosclerotic calcification. There is no retroperitoneal or periportal lymphadenopathy. No pelvic lymphadenopathy. Reproductive: Prostate small Other: No free fluid. Musculoskeletal: Compression deformities in the lower thoracic spine described on comparison MRI IMPRESSION: 1. Enhancing solid mass occupying the mid and upper pole of the LEFT kidney consistent with renal neoplasm. Favor renal cell carcinoma. 2. Mass contained within the pararenal fascia. 3. LEFT renal vein is normal. 4. Enhancing nodule of the lateral limb LEFT adrenal gland is indeterminate. 5. Compression deformities in the lower thoracic spine described on comparison MRI Electronically Signed   By: Suzy Bouchard M.D.   On: 06/28/2018 16:37   Dg Thoracolumabar Spine  Result Date: 06/25/2018 CLINICAL DATA:  T12-L1 compression fracture. EXAM: THORACOLUMBAR SPINE 1V COMPARISON:  Thoracic MRI 06/25/2018.  Chest CT 06/23/2018. FINDINGS: These views were obtained erect. Again demonstrated are acute compression fractures at T12 and L1. Both fractures demonstrate progressive loss of height compared with the CT 2 days ago, approximately 70% each. The loss of height also appears slightly greater than on the supine MRI done earlier today. No new fractures. Aortic atherosclerosis and gaseous distention of the colon, likely reflecting an ileus are noted. There is atelectasis at both lung bases and possible small pleural effusions. IMPRESSION: Progressive loss of height at the T12 and L1 fractures on this erect examination. Electronically Signed   By: Richardean Sale M.D.   On: 06/25/2018 18:37   Mr Thoracic Spine Wo Contrast  Result Date: 06/25/2018 CLINICAL DATA:  Severe low back pain and mid back pain began 06/23/2018. Patient is anticoagulated. History of syncope. EXAM: MRI THORACIC AND LUMBAR SPINE WITHOUT CONTRAST TECHNIQUE: Multiplanar and multiecho pulse sequences of the thoracic and  lumbar spine were obtained without intravenous contrast. COMPARISON:  CT chest abdomen pelvis 06/23/2018 revealed a large renal mass. FINDINGS: MRI THORACIC SPINE FINDINGS Alignment:  Anatomic Vertebrae: Acute compression fracture of T12. This is T1 hypointense, T2 hypointense, and STIR hyperintense. Loss of approximately 50% vertebral body height. Retropulsed bone, greater on the LEFT, 2-3 mm. No clear involvement of the pedicles or posterior elements. Assessment for epidural tumor or paravertebral soft tissue is limited in the absence of contrast. Cord:  Normal signal and morphology. Paraspinal and other soft tissues: LEFT renal and LEFT adrenal mass. Disc levels: No thoracic disc protrusion or spinal stenosis. MRI LUMBAR SPINE FINDINGS Segmentation:  Standard. Alignment:  Anatomic. Vertebrae: Acute L1 compression fracture, loss of approximately 50% vertebral body height. The vertebral body is T1 hypointense, T2 hypointense, and STIR hyperintense. 5 mm retropulsed fragment primarily midline. No definite involvement of the pedicles or posterior elements. In addition, there are fractures of L3, and L4 across the superior endplate, without compression deformity. Additional fracture without loss of vertebral body height is noted L5 inferiorly. Assessment for epidural tumor or paravertebral soft tissue representing tumor is limited in the absence of contrast. Conus medullaris and cauda  equina: Conus extends to the mid L2 level. Conus and cauda equina appear otherwise normal. Paraspinal and other soft tissues: LEFT renal and LEFT adrenal mass. The bladder is distended. Abdominal aortic aneurysm better demonstrated on CT. Disc levels: No lumbar disc protrusion or spinal stenosis. IMPRESSION: MR THORACIC SPINE IMPRESSION Acute compression fracture, T12, as predicted from CT. Loss of approximately 50% vertebral body height. MR LUMBAR SPINE IMPRESSION Acute compression fracture, L1, as predicted from CT. Loss of  approximately 50% vertebral body height. Superior endplate fractures at L3 and L4, without frank compression. Additional inferior endplate fracture at L5, again without loss of height. No visible conus compression, or definite epidural tumor. LEFT renal and LEFT adrenal masses, better demonstrated on CT, consistent with neoplasm. Bladder distention, query outlet obstruction. 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 Post infusion imaging could be helpful however, and is recommended for further evaluation if no contraindications. Electronically Signed   By: Staci Righter M.D.   On: 06/25/2018 10:20   Mr Lumbar Spine Wo Contrast  Result Date: 06/25/2018 CLINICAL DATA:  Severe low back pain and mid back pain began 06/23/2018. Patient is anticoagulated. History of syncope. EXAM: MRI THORACIC AND LUMBAR SPINE WITHOUT CONTRAST TECHNIQUE: Multiplanar and multiecho pulse sequences of the thoracic and lumbar spine were obtained without intravenous contrast. COMPARISON:  CT chest abdomen pelvis 06/23/2018 revealed a large renal mass. FINDINGS: MRI THORACIC SPINE FINDINGS Alignment:  Anatomic Vertebrae: Acute compression fracture of T12. This is T1 hypointense, T2 hypointense, and STIR hyperintense. Loss of approximately 50% vertebral body height. Retropulsed bone, greater on the LEFT, 2-3 mm. No clear involvement of the pedicles or posterior elements. Assessment for epidural tumor or paravertebral soft tissue is limited in the absence of contrast. Cord:  Normal signal and morphology. Paraspinal and other soft tissues: LEFT renal and LEFT adrenal mass. Disc levels: No thoracic disc protrusion or spinal stenosis. MRI LUMBAR SPINE FINDINGS Segmentation:  Standard. Alignment:  Anatomic. Vertebrae: Acute L1 compression fracture, loss of approximately 50% vertebral body height. The vertebral body  is T1 hypointense, T2 hypointense, and STIR hyperintense. 5 mm retropulsed fragment primarily midline. No definite involvement of the pedicles or posterior elements. In addition, there are fractures of L3, and L4 across the superior endplate, without compression deformity. Additional fracture without loss of vertebral body height is noted L5 inferiorly. Assessment for epidural tumor or paravertebral soft tissue representing tumor is limited in the absence of contrast. Conus medullaris and cauda equina: Conus extends to the mid L2 level. Conus and cauda equina appear otherwise normal. Paraspinal and other soft tissues: LEFT renal and LEFT adrenal mass. The bladder is distended. Abdominal aortic aneurysm better demonstrated on CT. Disc levels: No lumbar disc protrusion or spinal stenosis. IMPRESSION: MR THORACIC SPINE IMPRESSION Acute compression fracture, T12, as predicted from CT. Loss of approximately 50% vertebral body height. MR LUMBAR SPINE IMPRESSION Acute compression fracture, L1, as predicted from CT. Loss of approximately 50% vertebral body height. Superior endplate fractures at L3 and L4, without frank compression. Additional inferior endplate fracture at L5, again without loss of height. No visible conus compression, or definite epidural tumor. LEFT renal and LEFT adrenal masses, better demonstrated on CT, consistent with neoplasm. Bladder distention, query outlet obstruction. 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 Post infusion imaging could be helpful however, and is recommended  for further evaluation if no contraindications. Electronically Signed   By: Staci Righter M.D.   On: 06/25/2018 10:20   Dg Chest Port 1 View  Result Date: 06/26/2018 CLINICAL DATA:  78 year old male with sepsis EXAM: PORTABLE CHEST 1 VIEW COMPARISON:  06/25/2018, 06/25/2018, CT 06/23/2018  FINDINGS: Cardiomediastinal silhouette unchanged in size and contour. Unchanged event recorder on the left chest wall. Similar appearance of low lung volumes with linear opacities at the lung bases. No pneumothorax. Paraseptal emphysematous changes again noted. No confluent airspace disease. No displaced fracture IMPRESSION: Chronic lung changes without evidence of acute cardiopulmonary disease. Electronically Signed   By: Corrie Mckusick D.O.   On: 06/26/2018 16:16   Dg Chest Port 1 View  Result Date: 06/25/2018 CLINICAL DATA:  Shortness of breath. EXAM: PORTABLE CHEST 1 VIEW COMPARISON:  06/23/2018 FINDINGS: The heart size is stable. Aeration improved since the prior study with bibasilar atelectasis remaining. There is no evidence of pulmonary edema, consolidation, pneumothorax, nodule or pleural fluid. IMPRESSION: Improved aeration with bibasilar atelectasis remaining. Electronically Signed   By: Aletta Edouard M.D.   On: 06/25/2018 08:05      Subjective: - no chest pain, shortness of breath, no abdominal pain, nausea or vomiting.   Discharge Exam: BP 121/65 (BP Location: Right Arm)    Pulse (!) 59    Temp 97.7 F (36.5 C) (Oral)    Resp 17    Ht 5\' 7"  (1.702 m)    Wt 87.3 kg    SpO2 93%    BMI 30.14 kg/m   General: Pt is alert, awake, not in acute distress Cardiovascular: RRR, S1/S2 +, no rubs, no gallops Respiratory: CTA bilaterally, no wheezing, no rhonchi Abdominal: Soft, NT, ND, bowel sounds + Extremities: no edema, no cyanosis    The results of significant diagnostics from this hospitalization (including imaging, microbiology, ancillary and laboratory) are listed below for reference.     Microbiology: Recent Results (from the past 240 hour(s))  Culture, blood (x 2)     Status: None (Preliminary result)   Collection Time: 06/26/18  4:00 PM  Result Value Ref Range Status   Specimen Description BLOOD RIGHT ANTECUBITAL  Final   Special Requests   Final    BOTTLES DRAWN AEROBIC  ONLY Blood Culture adequate volume   Culture   Final    NO GROWTH 3 DAYS Performed at [facility], [address], [place], [place] [address]    Report Status PENDING  Incomplete  Culture, blood (x 2)     Status: None (Preliminary result)   Collection Time: 06/26/18  4:30 PM  Result Value Ref Range Status   Specimen Description BLOOD RIGHT ARM  Final   Special Requests   Final    BOTTLES DRAWN AEROBIC ONLY Blood Culture results may not be optimal due to an inadequate volume of blood received in culture bottles   Culture   Final    NO GROWTH 3 DAYS Performed at [facility], [place] [address] [place], [place] [address]    Report Status PENDING  Incomplete     Labs: BNP (last 3 results) No results for input(s): BNP in the last 8760 hours. Basic Metabolic Panel: Recent Labs  Lab 06/24/18 0258 06/25/18 0259 06/26/18 0334 06/26/18 1638 06/27/18 0618  NA 138 132* 133* 133* 134*  K 4.5 3.7 3.8 4.4 3.7  CL 106 99 101 95* 102  CO2 19* 22 21* 25 22  GLUCOSE 121* 144* 129* 117* 97  BUN 16 15 22  24* 20  CREATININE 0.83 0.76 0.82 0.94 0.90  CALCIUM 8.7* 8.7* 8.8* 8.9 7.8*  MG  --  1.9 1.9  --  1.8  PHOS  --  3.3 3.2  --  2.7   Liver Function Tests: Recent Labs  Lab 06/25/18 0259 06/26/18 0334 06/26/18 1638 06/27/18 0618  AST 22 19 21 19   ALT 30 29 31 30   ALKPHOS 61 56 55 45  BILITOT 1.6* 1.3* 0.9 1.0  PROT 6.2* 6.1* 6.1* 5.4*  ALBUMIN 3.3* 3.1* 3.1* 2.7*   No results for input(s): LIPASE, AMYLASE in the last 168 hours. No results for input(s): AMMONIA in the last 168 hours. CBC: Recent Labs  Lab 06/24/18 0258 06/25/18 0259 06/26/18 0334 06/26/18 1638 06/27/18 0618 06/29/18 0223  WBC 14.9* 17.7* 19.9* 19.0* 14.3* 8.4  NEUTROABS 13.0*  --   --  16.4*  --   --   HGB 16.9 15.5 15.7 15.6 14.5 13.0  HCT 49.2 44.9 44.1 44.4 41.5 36.8*  MCV 96.3 94.5 92.6 94.1 94.7 92.9  PLT 135* 123* 120* 135* 129* 125*   Cardiac Enzymes: Recent Labs  Lab  06/26/18 1832 06/27/18 0014 06/27/18 0618  TROPONINI 0.03* <0.03 <0.03   BNP: Invalid input(s): POCBNP CBG: No results for input(s): GLUCAP in the last 168 hours. D-Dimer No results for input(s): DDIMER in the last 72 hours. Hgb A1c No results for input(s): HGBA1C in the last 72 hours. Lipid Profile No results for input(s): CHOL, HDL, LDLCALC, TRIG, CHOLHDL, LDLDIRECT in the last 72 hours. Thyroid function studies No results for input(s): TSH, T4TOTAL, T3FREE, THYROIDAB in the last 72 hours.  Invalid input(s): FREET3 Anemia work up No results for input(s): VITAMINB12, FOLATE, FERRITIN, TIBC, IRON, RETICCTPCT in the last 72 hours. Urinalysis    Component Value Date/Time   COLORURINE YELLOW 06/26/2018 1840   APPEARANCEUR CLEAR 06/26/2018 1840   LABSPEC 1.028 06/26/2018 1840   PHURINE 6.0 06/26/2018 1840   GLUCOSEU NEGATIVE 06/26/2018 1840   GLUCOSEU NEGATIVE 11/28/2012 1001   HGBUR NEGATIVE 06/26/2018 1840   BILIRUBINUR NEGATIVE 06/26/2018 1840   BILIRUBINUR neg 02/20/2015 1133   KETONESUR NEGATIVE 06/26/2018 1840   PROTEINUR NEGATIVE 06/26/2018 1840   UROBILINOGEN 0.2 02/20/2015 1133   UROBILINOGEN 0.2 11/28/2012 1001   NITRITE NEGATIVE 06/26/2018 1840   LEUKOCYTESUR NEGATIVE 06/26/2018 1840   Sepsis Labs Invalid input(s): PROCALCITONIN,  WBC,  LACTICIDVEN  FURTHER DISCHARGE INSTRUCTIONS:   Get Medicines reviewed and adjusted: Please take all your medications with you for your next visit with your Primary MD   Laboratory/radiological data: Please request your Primary MD to go over all hospital tests and procedure/radiological results at the follow up, please ask your Primary MD to get all Hospital records sent to his/her office.   In some cases, they will be blood work, cultures and biopsy results pending at the time of your discharge. Please request that your primary care M.D. goes through all the records of your hospital data and follows up on these results.    Also Note the following: If you experience worsening of your admission symptoms, develop shortness of breath, life threatening emergency, suicidal or homicidal thoughts you must seek medical attention immediately by calling 911 or calling your MD immediately  if symptoms less severe.   You must read complete instructions/literature along with all the possible adverse reactions/side effects for all the Medicines you take and that have been prescribed to you. Take any new Medicines after you have completely  understood and accpet all the possible adverse reactions/side effects.    Do not drive when taking Pain medications or sleeping medications (Benzodaizepines)   Do not take more than prescribed Pain, Sleep and Anxiety Medications. It is not advisable to combine anxiety,sleep and pain medications without talking with your primary care practitioner   Special Instructions: If you have smoked or chewed Tobacco  in the last 2 yrs please stop smoking, stop any regular Alcohol  and or any Recreational drug use.   Wear Seat belts while driving.   Please note: You were cared for by a hospitalist during your hospital stay. Once you are discharged, your primary care physician will handle any further medical issues. Please note that NO REFILLS for any discharge medications will be authorized once you are discharged, as it is imperative that you return to your primary care physician (or establish a relationship with a primary care physician if you do not have one) for your post hospital discharge needs so that they can reassess your need for medications and monitor your lab values.  Time coordinating discharge: 40 minutes  SIGNED:  Marzetta Board, MD, PhD 06/30/2018, 2:18 PM

## 2018-07-01 LAB — CULTURE, BLOOD (ROUTINE X 2)
Culture: NO GROWTH
Culture: NO GROWTH
Special Requests: ADEQUATE

## 2018-07-03 ENCOUNTER — Telehealth: Payer: Self-pay

## 2018-07-03 DIAGNOSIS — W19XXXD Unspecified fall, subsequent encounter: Secondary | ICD-10-CM | POA: Diagnosis not present

## 2018-07-03 DIAGNOSIS — Z95818 Presence of other cardiac implants and grafts: Secondary | ICD-10-CM | POA: Diagnosis not present

## 2018-07-03 DIAGNOSIS — Z87891 Personal history of nicotine dependence: Secondary | ICD-10-CM | POA: Diagnosis not present

## 2018-07-03 DIAGNOSIS — E278 Other specified disorders of adrenal gland: Secondary | ICD-10-CM | POA: Diagnosis not present

## 2018-07-03 DIAGNOSIS — S22080D Wedge compression fracture of T11-T12 vertebra, subsequent encounter for fracture with routine healing: Secondary | ICD-10-CM | POA: Diagnosis not present

## 2018-07-03 DIAGNOSIS — I480 Paroxysmal atrial fibrillation: Secondary | ICD-10-CM | POA: Diagnosis not present

## 2018-07-03 DIAGNOSIS — N2889 Other specified disorders of kidney and ureter: Secondary | ICD-10-CM | POA: Diagnosis not present

## 2018-07-03 DIAGNOSIS — S32010D Wedge compression fracture of first lumbar vertebra, subsequent encounter for fracture with routine healing: Secondary | ICD-10-CM | POA: Diagnosis not present

## 2018-07-03 DIAGNOSIS — K219 Gastro-esophageal reflux disease without esophagitis: Secondary | ICD-10-CM | POA: Diagnosis not present

## 2018-07-03 DIAGNOSIS — I714 Abdominal aortic aneurysm, without rupture: Secondary | ICD-10-CM | POA: Diagnosis not present

## 2018-07-03 NOTE — Telephone Encounter (Signed)
LVM for Ria Comment at [facility] with verbal okay as requested.

## 2018-07-03 NOTE — Telephone Encounter (Signed)
Spoke with pt regarding appt on 07/04/18. Pt stated he will check vitals prior to appt. Pt questions and concerns were address.

## 2018-07-03 NOTE — Telephone Encounter (Signed)
Ok with me 

## 2018-07-04 ENCOUNTER — Telehealth (INDEPENDENT_AMBULATORY_CARE_PROVIDER_SITE_OTHER): Payer: Medicare Other | Admitting: Internal Medicine

## 2018-07-04 ENCOUNTER — Telehealth: Payer: Self-pay | Admitting: Internal Medicine

## 2018-07-04 ENCOUNTER — Other Ambulatory Visit: Payer: Self-pay

## 2018-07-04 DIAGNOSIS — R55 Syncope and collapse: Secondary | ICD-10-CM

## 2018-07-04 DIAGNOSIS — I480 Paroxysmal atrial fibrillation: Secondary | ICD-10-CM | POA: Diagnosis not present

## 2018-07-04 DIAGNOSIS — I495 Sick sinus syndrome: Secondary | ICD-10-CM

## 2018-07-04 NOTE — Telephone Encounter (Signed)
Copied from [place] [id number]. Topic: Quick Communication - Home Health Verbal Orders >> Jul 04, 2018  8:17 AM Virl Axe D wrote: Caller/Agency: Gerald Stabs, PT, [place] Number: [phone] Requesting OT/PT/Skilled Nursing/Social Work/Speech Therapy: PT Frequency: 2 week 3

## 2018-07-04 NOTE — Telephone Encounter (Signed)
yes

## 2018-07-04 NOTE — Progress Notes (Signed)
Electrophysiology TeleHealth Note   Due to national recommendations of social distancing due to COVID 19, an audio/video telehealth visit is felt to be most appropriate for this patient at this time.  See MyChart message from today for the patient's consent to telehealth for [facility]  doximity video used for the visit today.   Date:  07/04/2018   ID:  [name], DOB 1940/06/16, MRN [id number]  Location: patient's home  Provider location: [place]  Evaluation Performed: Follow-up visit  PCP:  [name], MD  Cardiologist:  Mertie Moores, MD  Electrophysiologist:  Dr Rayann Heman  Chief Complaint:  syncope  History of Present Illness:    [name] is a 78 y.o. male who presents via audio/video conferencing for a telehealth visit today.  He was recently hospitalized with new spinal mass and severe back pain.  He also had an episode of syncope prior to his hospitalization which was confirmed to be due to a prolonged pause  He remains in significant back pain.  Today, he denies symptoms of palpitations, chest pain, shortness of breath,  lower extremity edema, dizziness, presyncope, or further syncope.  The patient is otherwise without complaint today.  The patient denies symptoms of fevers, chills, cough, or new SOB worrisome for COVID 19.  Past Medical History:  Diagnosis Date  . Diverticulosis   . Esophageal reflux   . Esophageal stricture   . HA (headache)   . Hearing loss   . Hiatal hernia   . Hypertriglyceridemia   . Hypoglycemia   . Obesity, unspecified   . PAF (paroxysmal atrial fibrillation) (HCC)    Echo with normal LV function and mild LVH  . Syncope    presented with atrial fib converted with Diltiazem    Past Surgical History:  Procedure Laterality Date  . LOOP RECORDER INSERTION N/A 04/26/2018   Procedure: LOOP RECORDER INSERTION;  Surgeon: Thompson Grayer, MD;  Location: [facility];  Service: Cardiovascular;  Laterality:  N/A;  . NASAL SEPTUM SURGERY    . TONSILLECTOMY    . UMBILICAL HERNIA REPAIR  1993  . WISDOM TOOTH EXTRACTION     about 78 years old    Current Outpatient Medications  Medication Sig Dispense Refill  . atorvastatin (LIPITOR) 20 MG tablet Take 1 tablet (20 mg total) by mouth daily. 90 tablet 1  . ELIQUIS 5 MG TABS tablet Take 1 tablet by mouth twice daily 60 tablet 5  . esomeprazole (NEXIUM) 40 MG capsule Take 1 capsule (40 mg total) by mouth daily. (Patient taking differently: Take 40 mg by mouth daily before breakfast. ) 30 capsule 11  . gabapentin (NEURONTIN) 300 MG capsule Take 300 mg by mouth See admin instructions. Take one capsule by mouth at bedtime for one week then increase to twice a day as needed for nerve pain    . HYDROcodone-acetaminophen (NORCO/VICODIN) 5-325 MG tablet Take 1 tablet by mouth every 4 (four) hours as needed for up to 5 days for moderate pain. 30 tablet 0  . omega-3 acid ethyl esters (LOVAZA) 1 g capsule Take 2 capsules by mouth twice daily 360 capsule 0  . solifenacin (VESICARE) 10 MG tablet Take 1 tablet (10 mg total) by mouth daily. 90 tablet 1   No current facility-administered medications for this visit.     Allergies:   Codeine and Monosodium glutamate   Social History:  The patient  reports that he quit smoking about 31 years ago.  He has never used smokeless tobacco. He reports that he does not drink alcohol or use drugs.   Family History:  The patient's  family history includes Bipolar disorder in his paternal grandmother and sister; Emphysema in his father; Gallbladder disease in his sister.   ROS:  Please see the history of present illness.   All other systems are personally reviewed and negative.    Exam:    Vital Signs:  There were no vitals taken for this visit.  Well appearing, alert and conversant, regular work of breathing,  good skin color Eyes- anicteric, neuro- grossly intact, skin- no apparent rash or lesions or cyanosis, mouth- oral  mucosa is pink   Labs/Other Tests and Data Reviewed:    Recent Labs: 01/11/2018: TSH 2.01 06/27/2018: ALT 30; BUN 20; Creatinine, Ser 0.90; Magnesium 1.8; Potassium 3.7; Sodium 134 06/29/2018: Hemoglobin 13.0; Platelets 125   Wt Readings from Last 3 Encounters:  06/23/18 192 lb 7.4 oz (87.3 kg)  05/01/18 202 lb 8 oz (91.9 kg)  04/26/18 196 lb 6.4 oz (89.1 kg)     Other studies personally reviewed: Additional studies/ records that were reviewed today include: my prior notes, recent hospital recores  Review of the above records today demonstrates: as above   ASSESSMENT & PLAN:    1.  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  Was not having pain or vagal symptoms at the time.  No reversible causes. Will plan pacemaker implantation with ILR removal in a few weeks.  Given recent hospitalization for SIRS, we deferred the procedure.  On my discussion with patient and wife today, they feel that he is having severe back pain and is unable to proceed currently. They would prefer to follow-up with me by telehealth visit in a couple weeks. No driving in the interim. They are aware that he is at risk for syncope in the interim.  2. afib chads2vasc score is 2. Stable No change required today  3. intractable back pain Possibly due to vertebral fracture  4. 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  5. COVID 19 screen The patient denies symptoms of COVID 19 at this time.  The importance of social distancing was discussed today.  Follow-up:  I will follow-up with virtual visit 5/22 Next remote: 06/2018  Current medicines are reviewed at length with the patient today.   The patient does not have concerns regarding his medicines.  The following changes were made today:  none  Labs/ tests ordered today include:  No orders of  the defined types were placed in this encounter.   Patient Risk:  after full review of this patients clinical status, I feel that they are at moderate risk at this time.  Today, I have spent 18 minutes with the patient with telehealth technology discussing syncope .    Army Fossa, MD  07/04/2018 10:37 AM     [facility] [address] [place] [address] [phone] (office) [phone] (fax)

## 2018-07-04 NOTE — H&P (View-Only) (Signed)
Electrophysiology TeleHealth Note   Due to national recommendations of social distancing due to COVID 19, an audio/video telehealth visit is felt to be most appropriate for this patient at this time.  See MyChart message from today for the patient's consent to telehealth for [facility].  doximity video used for the visit today.   Date:  07/04/2018   ID:  [name], DOB Feb 08, 1941, MRN [id number]  Location: patient's home  Provider location: [place]  Evaluation Performed: Follow-up visit  PCP:  [name], MD  Cardiologist:  Mertie Moores, MD  Electrophysiologist:  Dr Rayann Heman  Chief Complaint:  syncope  History of Present Illness:    [name] is a 78 y.o. male who presents via audio/video conferencing for a telehealth visit today.  He was recently hospitalized with new spinal mass and severe back pain.  He also had an episode of syncope prior to his hospitalization which was confirmed to be due to a prolonged pause  He remains in significant back pain.  Today, he denies symptoms of palpitations, chest pain, shortness of breath,  lower extremity edema, dizziness, presyncope, or further syncope.  The patient is otherwise without complaint today.  The patient denies symptoms of fevers, chills, cough, or new SOB worrisome for COVID 19.  Past Medical History:  Diagnosis Date  . Diverticulosis   . Esophageal reflux   . Esophageal stricture   . HA (headache)   . Hearing loss   . Hiatal hernia   . Hypertriglyceridemia   . Hypoglycemia   . Obesity, unspecified   . PAF (paroxysmal atrial fibrillation) (HCC)    Echo with normal LV function and mild LVH  . Syncope    presented with atrial fib converted with Diltiazem    Past Surgical History:  Procedure Laterality Date  . LOOP RECORDER INSERTION N/A 04/26/2018   Procedure: LOOP RECORDER INSERTION;  Surgeon: Thompson Grayer, MD;  Location: [facility];  Service: Cardiovascular;  Laterality:  N/A;  . NASAL SEPTUM SURGERY    . TONSILLECTOMY    . UMBILICAL HERNIA REPAIR  1993  . WISDOM TOOTH EXTRACTION     about 78 years old    Current Outpatient Medications  Medication Sig Dispense Refill  . atorvastatin (LIPITOR) 20 MG tablet Take 1 tablet (20 mg total) by mouth daily. 90 tablet 1  . ELIQUIS 5 MG TABS tablet Take 1 tablet by mouth twice daily 60 tablet 5  . esomeprazole (NEXIUM) 40 MG capsule Take 1 capsule (40 mg total) by mouth daily. (Patient taking differently: Take 40 mg by mouth daily before breakfast. ) 30 capsule 11  . gabapentin (NEURONTIN) 300 MG capsule Take 300 mg by mouth See admin instructions. Take one capsule by mouth at bedtime for one week then increase to twice a day as needed for nerve pain    . HYDROcodone-acetaminophen (NORCO/VICODIN) 5-325 MG tablet Take 1 tablet by mouth every 4 (four) hours as needed for up to 5 days for moderate pain. 30 tablet 0  . omega-3 acid ethyl esters (LOVAZA) 1 g capsule Take 2 capsules by mouth twice daily 360 capsule 0  . solifenacin (VESICARE) 10 MG tablet Take 1 tablet (10 mg total) by mouth daily. 90 tablet 1   No current facility-administered medications for this visit.     Allergies:   Codeine and Monosodium glutamate   Social History:  The patient  reports that he quit smoking about 31 years ago.  He has never used smokeless tobacco. He reports that he does not drink alcohol or use drugs.   Family History:  The patient's  family history includes Bipolar disorder in his paternal grandmother and sister; Emphysema in his father; Gallbladder disease in his sister.   ROS:  Please see the history of present illness.   All other systems are personally reviewed and negative.    Exam:    Vital Signs:  There were no vitals taken for this visit.  Well appearing, alert and conversant, regular work of breathing,  good skin color Eyes- anicteric, neuro- grossly intact, skin- no apparent rash or lesions or cyanosis, mouth- oral  mucosa is pink   Labs/Other Tests and Data Reviewed:    Recent Labs: 01/11/2018: TSH 2.01 06/27/2018: ALT 30; BUN 20; Creatinine, Ser 0.90; Magnesium 1.8; Potassium 3.7; Sodium 134 06/29/2018: Hemoglobin 13.0; Platelets 125   Wt Readings from Last 3 Encounters:  06/23/18 192 lb 7.4 oz (87.3 kg)  05/01/18 202 lb 8 oz (91.9 kg)  04/26/18 196 lb 6.4 oz (89.1 kg)     Other studies personally reviewed: Additional studies/ records that were reviewed today include: my prior notes, recent hospital recores  Review of the above records today demonstrates: as above   ASSESSMENT & PLAN:    1.  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  Was not having pain or vagal symptoms at the time.  No reversible causes. Will plan pacemaker implantation with ILR removal in a few weeks.  Given recent hospitalization for SIRS, we deferred the procedure.  On my discussion with patient and wife today, they feel that he is having severe back pain and is unable to proceed currently. They would prefer to follow-up with me by telehealth visit in a couple weeks. No driving in the interim. They are aware that he is at risk for syncope in the interim.  2. afib chads2vasc score is 2. Stable No change required today  3. intractable back pain Possibly due to vertebral fracture  4. 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  5. COVID 19 screen The patient denies symptoms of COVID 19 at this time.  The importance of social distancing was discussed today.  Follow-up:  I will follow-up with virtual visit 5/22 Next remote: 06/2018  Current medicines are reviewed at length with the patient today.   The patient does not have concerns regarding his medicines.  The following changes were made today:  none  Labs/ tests ordered today include:  No orders of  the defined types were placed in this encounter.   Patient Risk:  after full review of this patients clinical status, I feel that they are at moderate risk at this time.  Today, I have spent 18 minutes with the patient with telehealth technology discussing syncope .    Army Fossa, MD  07/04/2018 10:37 AM     [facility] [address] [place] [address] [phone] (office) [phone] (fax)

## 2018-07-04 NOTE — Telephone Encounter (Signed)
Verbal orders given for below.  

## 2018-07-05 DIAGNOSIS — N2889 Other specified disorders of kidney and ureter: Secondary | ICD-10-CM | POA: Diagnosis not present

## 2018-07-05 DIAGNOSIS — Z87891 Personal history of nicotine dependence: Secondary | ICD-10-CM | POA: Diagnosis not present

## 2018-07-05 DIAGNOSIS — I714 Abdominal aortic aneurysm, without rupture: Secondary | ICD-10-CM | POA: Diagnosis not present

## 2018-07-05 DIAGNOSIS — I480 Paroxysmal atrial fibrillation: Secondary | ICD-10-CM | POA: Diagnosis not present

## 2018-07-05 DIAGNOSIS — E278 Other specified disorders of adrenal gland: Secondary | ICD-10-CM | POA: Diagnosis not present

## 2018-07-05 DIAGNOSIS — K219 Gastro-esophageal reflux disease without esophagitis: Secondary | ICD-10-CM | POA: Diagnosis not present

## 2018-07-05 DIAGNOSIS — W19XXXD Unspecified fall, subsequent encounter: Secondary | ICD-10-CM | POA: Diagnosis not present

## 2018-07-05 DIAGNOSIS — Z95818 Presence of other cardiac implants and grafts: Secondary | ICD-10-CM | POA: Diagnosis not present

## 2018-07-05 DIAGNOSIS — S32010D Wedge compression fracture of first lumbar vertebra, subsequent encounter for fracture with routine healing: Secondary | ICD-10-CM | POA: Diagnosis not present

## 2018-07-05 DIAGNOSIS — S22080D Wedge compression fracture of T11-T12 vertebra, subsequent encounter for fracture with routine healing: Secondary | ICD-10-CM | POA: Diagnosis not present

## 2018-07-06 DIAGNOSIS — W19XXXD Unspecified fall, subsequent encounter: Secondary | ICD-10-CM | POA: Diagnosis not present

## 2018-07-06 DIAGNOSIS — S32010D Wedge compression fracture of first lumbar vertebra, subsequent encounter for fracture with routine healing: Secondary | ICD-10-CM | POA: Diagnosis not present

## 2018-07-06 DIAGNOSIS — I480 Paroxysmal atrial fibrillation: Secondary | ICD-10-CM | POA: Diagnosis not present

## 2018-07-06 DIAGNOSIS — S22080D Wedge compression fracture of T11-T12 vertebra, subsequent encounter for fracture with routine healing: Secondary | ICD-10-CM | POA: Diagnosis not present

## 2018-07-06 DIAGNOSIS — E278 Other specified disorders of adrenal gland: Secondary | ICD-10-CM | POA: Diagnosis not present

## 2018-07-06 DIAGNOSIS — Z87891 Personal history of nicotine dependence: Secondary | ICD-10-CM | POA: Diagnosis not present

## 2018-07-06 DIAGNOSIS — I714 Abdominal aortic aneurysm, without rupture: Secondary | ICD-10-CM | POA: Diagnosis not present

## 2018-07-06 DIAGNOSIS — K219 Gastro-esophageal reflux disease without esophagitis: Secondary | ICD-10-CM | POA: Diagnosis not present

## 2018-07-06 DIAGNOSIS — Z95818 Presence of other cardiac implants and grafts: Secondary | ICD-10-CM | POA: Diagnosis not present

## 2018-07-06 DIAGNOSIS — N2889 Other specified disorders of kidney and ureter: Secondary | ICD-10-CM | POA: Diagnosis not present

## 2018-07-07 DIAGNOSIS — I714 Abdominal aortic aneurysm, without rupture: Secondary | ICD-10-CM | POA: Diagnosis not present

## 2018-07-07 DIAGNOSIS — Z95818 Presence of other cardiac implants and grafts: Secondary | ICD-10-CM | POA: Diagnosis not present

## 2018-07-07 DIAGNOSIS — S32010D Wedge compression fracture of first lumbar vertebra, subsequent encounter for fracture with routine healing: Secondary | ICD-10-CM | POA: Diagnosis not present

## 2018-07-07 DIAGNOSIS — K219 Gastro-esophageal reflux disease without esophagitis: Secondary | ICD-10-CM | POA: Diagnosis not present

## 2018-07-07 DIAGNOSIS — S22080D Wedge compression fracture of T11-T12 vertebra, subsequent encounter for fracture with routine healing: Secondary | ICD-10-CM | POA: Diagnosis not present

## 2018-07-07 DIAGNOSIS — E278 Other specified disorders of adrenal gland: Secondary | ICD-10-CM | POA: Diagnosis not present

## 2018-07-07 DIAGNOSIS — N2889 Other specified disorders of kidney and ureter: Secondary | ICD-10-CM | POA: Diagnosis not present

## 2018-07-07 DIAGNOSIS — I480 Paroxysmal atrial fibrillation: Secondary | ICD-10-CM | POA: Diagnosis not present

## 2018-07-07 DIAGNOSIS — W19XXXD Unspecified fall, subsequent encounter: Secondary | ICD-10-CM | POA: Diagnosis not present

## 2018-07-07 DIAGNOSIS — Z87891 Personal history of nicotine dependence: Secondary | ICD-10-CM | POA: Diagnosis not present

## 2018-07-08 DIAGNOSIS — I714 Abdominal aortic aneurysm, without rupture: Secondary | ICD-10-CM | POA: Diagnosis not present

## 2018-07-08 DIAGNOSIS — S22080D Wedge compression fracture of T11-T12 vertebra, subsequent encounter for fracture with routine healing: Secondary | ICD-10-CM | POA: Diagnosis not present

## 2018-07-08 DIAGNOSIS — S32010D Wedge compression fracture of first lumbar vertebra, subsequent encounter for fracture with routine healing: Secondary | ICD-10-CM | POA: Diagnosis not present

## 2018-07-08 DIAGNOSIS — Z95818 Presence of other cardiac implants and grafts: Secondary | ICD-10-CM | POA: Diagnosis not present

## 2018-07-08 DIAGNOSIS — N2889 Other specified disorders of kidney and ureter: Secondary | ICD-10-CM | POA: Diagnosis not present

## 2018-07-08 DIAGNOSIS — I480 Paroxysmal atrial fibrillation: Secondary | ICD-10-CM | POA: Diagnosis not present

## 2018-07-08 DIAGNOSIS — W19XXXD Unspecified fall, subsequent encounter: Secondary | ICD-10-CM | POA: Diagnosis not present

## 2018-07-08 DIAGNOSIS — Z87891 Personal history of nicotine dependence: Secondary | ICD-10-CM | POA: Diagnosis not present

## 2018-07-08 DIAGNOSIS — E278 Other specified disorders of adrenal gland: Secondary | ICD-10-CM | POA: Diagnosis not present

## 2018-07-08 DIAGNOSIS — K219 Gastro-esophageal reflux disease without esophagitis: Secondary | ICD-10-CM | POA: Diagnosis not present

## 2018-07-10 ENCOUNTER — Telehealth: Payer: Medicare Other | Admitting: Internal Medicine

## 2018-07-10 ENCOUNTER — Telehealth: Payer: Self-pay | Admitting: Internal Medicine

## 2018-07-10 DIAGNOSIS — N2889 Other specified disorders of kidney and ureter: Secondary | ICD-10-CM | POA: Diagnosis not present

## 2018-07-10 DIAGNOSIS — K219 Gastro-esophageal reflux disease without esophagitis: Secondary | ICD-10-CM | POA: Diagnosis not present

## 2018-07-10 DIAGNOSIS — Z87891 Personal history of nicotine dependence: Secondary | ICD-10-CM | POA: Diagnosis not present

## 2018-07-10 DIAGNOSIS — I714 Abdominal aortic aneurysm, without rupture: Secondary | ICD-10-CM | POA: Diagnosis not present

## 2018-07-10 DIAGNOSIS — I480 Paroxysmal atrial fibrillation: Secondary | ICD-10-CM | POA: Diagnosis not present

## 2018-07-10 DIAGNOSIS — W19XXXD Unspecified fall, subsequent encounter: Secondary | ICD-10-CM | POA: Diagnosis not present

## 2018-07-10 DIAGNOSIS — S32010D Wedge compression fracture of first lumbar vertebra, subsequent encounter for fracture with routine healing: Secondary | ICD-10-CM | POA: Diagnosis not present

## 2018-07-10 DIAGNOSIS — E278 Other specified disorders of adrenal gland: Secondary | ICD-10-CM | POA: Diagnosis not present

## 2018-07-10 DIAGNOSIS — S22080D Wedge compression fracture of T11-T12 vertebra, subsequent encounter for fracture with routine healing: Secondary | ICD-10-CM | POA: Diagnosis not present

## 2018-07-10 DIAGNOSIS — Z95818 Presence of other cardiac implants and grafts: Secondary | ICD-10-CM | POA: Diagnosis not present

## 2018-07-10 NOTE — Telephone Encounter (Signed)
Copied from [place] [id number]. Topic: Quick Communication - Home Health Verbal Orders >> Jul 10, 2018  3:23 PM Vernona Rieger wrote: Caller/Agency: [facility] Number: [phone] secured VM Requesting OT/PT/Skilled Nursing/Social Work/Speech Therapy: Occupational Therapy Frequency:  2 week 1 starting next week

## 2018-07-11 ENCOUNTER — Other Ambulatory Visit: Payer: Self-pay

## 2018-07-11 ENCOUNTER — Ambulatory Visit: Payer: Medicare Other | Admitting: *Deleted

## 2018-07-11 NOTE — Telephone Encounter (Signed)
Can we get patient in for a follow up? Will you reach out to him please.    lvm for Frank [name] with [facility] with verbal okay as request.

## 2018-07-12 LAB — CUP PACEART REMOTE DEVICE CHECK
Date Time Interrogation Session: 20200512034230
Implantable Pulse Generator Implant Date: 20200226

## 2018-07-13 ENCOUNTER — Other Ambulatory Visit: Payer: Self-pay | Admitting: Nurse Practitioner

## 2018-07-13 ENCOUNTER — Telehealth: Payer: Self-pay | Admitting: Cardiovascular Disease

## 2018-07-13 ENCOUNTER — Telehealth: Payer: Self-pay | Admitting: *Deleted

## 2018-07-13 ENCOUNTER — Telehealth: Payer: Self-pay | Admitting: Nurse Practitioner

## 2018-07-13 DIAGNOSIS — W19XXXD Unspecified fall, subsequent encounter: Secondary | ICD-10-CM | POA: Diagnosis not present

## 2018-07-13 DIAGNOSIS — Z87891 Personal history of nicotine dependence: Secondary | ICD-10-CM | POA: Diagnosis not present

## 2018-07-13 DIAGNOSIS — N2889 Other specified disorders of kidney and ureter: Secondary | ICD-10-CM | POA: Diagnosis not present

## 2018-07-13 DIAGNOSIS — Z95818 Presence of other cardiac implants and grafts: Secondary | ICD-10-CM | POA: Diagnosis not present

## 2018-07-13 DIAGNOSIS — S22080D Wedge compression fracture of T11-T12 vertebra, subsequent encounter for fracture with routine healing: Secondary | ICD-10-CM | POA: Diagnosis not present

## 2018-07-13 DIAGNOSIS — S32010D Wedge compression fracture of first lumbar vertebra, subsequent encounter for fracture with routine healing: Secondary | ICD-10-CM | POA: Diagnosis not present

## 2018-07-13 DIAGNOSIS — I714 Abdominal aortic aneurysm, without rupture: Secondary | ICD-10-CM | POA: Diagnosis not present

## 2018-07-13 DIAGNOSIS — E278 Other specified disorders of adrenal gland: Secondary | ICD-10-CM | POA: Diagnosis not present

## 2018-07-13 DIAGNOSIS — I480 Paroxysmal atrial fibrillation: Secondary | ICD-10-CM | POA: Diagnosis not present

## 2018-07-13 DIAGNOSIS — K219 Gastro-esophageal reflux disease without esophagitis: Secondary | ICD-10-CM | POA: Diagnosis not present

## 2018-07-13 NOTE — Telephone Encounter (Signed)
Call placed to Pt wife to discuss direction for Pt pacemaker implant next Tuesday  Please arrive to ADMITTING down the hall from the  [facility] main entrance of [place] hospital at:  9:00 am on Jul 18, 2018.  Pt will need to go alone Do not eat or drink after midnight prior to procedure Do not take any medications the morning of the procedure Hold Eliquis for 24 hours.  Last dose will be May 18 the AM dose Plan for one night stay You will need someone to drive you home at discharge  Advised will receive a call to schedule covid testing.  All questions answered.

## 2018-07-13 NOTE — Telephone Encounter (Signed)
LMTCB  Patient needs to be schedules for COVID19 Testing tomorrow.

## 2018-07-13 NOTE — Telephone Encounter (Signed)
New Message:     Pt has an appointment on 07-21-18. Wife would like for appointment to be moved up please. He had a passing out episode yesterday.a  Pt c/o Syncope: STAT if syncope occurred within 30 minutes and pt complains of lightheadedness High Priority if episode of passing out, completely, today or in last 24 hours   1. Did you pass out today? No,  2. When is the last time you passed out? Yesterday at 12:30   3. Has this occurred multiple times? The last time this happen  it was in April  4.  5. Did you have any symptoms prior to passing out? no

## 2018-07-13 NOTE — Telephone Encounter (Signed)
LINQ alert for pause episode 07/12/18 at 11:33 AM.  Per last telemedicine note, plan for PPM implant in the next few weeks after he recovers from recent illness. Will call patient today for symptoms/timing of PPM.

## 2018-07-13 NOTE — Telephone Encounter (Signed)
Spoke to wife and they will call back to schedule

## 2018-07-13 NOTE — Telephone Encounter (Signed)
APPT 5/15 @ 11:45am       COVID-19 Pre-Screening Questions:  . In the past 7 to 10 days have you had a cough,  shortness of breath, headache, congestion, fever, body aches, chills, sore throat, or sudden loss of taste or sense of smell? NO . Have you been around anyone with known Covid 19. NO . Have you been around anyone who is awaiting Covid 19 test results in the past 7 to 10 days? NO . Have you been around anyone who has been exposed to Covid 19, or has mentioned symptoms of Covid 19 within the past 7 to 10 days? NO  If you have any concerns about symptoms your patients report please contact your leadership team, or the provider the patient is seeing in the office for further guidance.

## 2018-07-13 NOTE — Telephone Encounter (Signed)
Spoke with patient's wife. He had a presyncopal spell yesterday that correlates with pause episode.  He is feeling improved from a back pain standpoint and has been able to get out of the house.  His wife feels that he is able to travel and have pacemaker implanted at this point. We discussed options for timing of device implant.  Pacemaker implant scheduled for Tuesday 5/19 at 11AM with Dr Rayann Heman. Will ask Sonia Baller to call the patient to schedule COVID testing for tomorrow and review instructions for Tuesday. ER precautions reviewed.   Chanetta Marshall, NP 07/13/2018 9:05 AM

## 2018-07-14 ENCOUNTER — Other Ambulatory Visit (HOSPITAL_COMMUNITY)
Admission: RE | Admit: 2018-07-14 | Discharge: 2018-07-14 | Disposition: A | Discharge status: Home / Self Care | Payer: Medicare Other | Source: Ambulatory Visit | Attending: Internal Medicine | Admitting: Internal Medicine

## 2018-07-14 DIAGNOSIS — Z1159 Encounter for screening for other viral diseases: Secondary | ICD-10-CM | POA: Diagnosis not present

## 2018-07-15 LAB — NOVEL CORONAVIRUS, NAA (HOSP ORDER, SEND-OUT TO REF LAB; TAT 18-24 HRS): SARS-CoV-2, NAA: NOT DETECTED

## 2018-07-17 ENCOUNTER — Telehealth: Payer: Self-pay | Admitting: Internal Medicine

## 2018-07-17 DIAGNOSIS — Z87891 Personal history of nicotine dependence: Secondary | ICD-10-CM | POA: Diagnosis not present

## 2018-07-17 DIAGNOSIS — K219 Gastro-esophageal reflux disease without esophagitis: Secondary | ICD-10-CM | POA: Diagnosis not present

## 2018-07-17 DIAGNOSIS — I714 Abdominal aortic aneurysm, without rupture: Secondary | ICD-10-CM | POA: Diagnosis not present

## 2018-07-17 DIAGNOSIS — I480 Paroxysmal atrial fibrillation: Secondary | ICD-10-CM | POA: Diagnosis not present

## 2018-07-17 DIAGNOSIS — N2889 Other specified disorders of kidney and ureter: Secondary | ICD-10-CM | POA: Diagnosis not present

## 2018-07-17 DIAGNOSIS — W19XXXD Unspecified fall, subsequent encounter: Secondary | ICD-10-CM | POA: Diagnosis not present

## 2018-07-17 DIAGNOSIS — S22080D Wedge compression fracture of T11-T12 vertebra, subsequent encounter for fracture with routine healing: Secondary | ICD-10-CM | POA: Diagnosis not present

## 2018-07-17 DIAGNOSIS — Z95818 Presence of other cardiac implants and grafts: Secondary | ICD-10-CM | POA: Diagnosis not present

## 2018-07-17 DIAGNOSIS — E278 Other specified disorders of adrenal gland: Secondary | ICD-10-CM | POA: Diagnosis not present

## 2018-07-17 DIAGNOSIS — S32010D Wedge compression fracture of first lumbar vertebra, subsequent encounter for fracture with routine healing: Secondary | ICD-10-CM | POA: Diagnosis not present

## 2018-07-17 NOTE — Telephone Encounter (Signed)
Copied from [place] [id number]. Topic: General - Other >> Jul 17, 2018  4:16 PM Pauline Good wrote: Reason for CRM: Nancy/[facility] stating the wife and the pt decided the pt don't need the OT visits anymore. Just and FYI

## 2018-07-18 ENCOUNTER — Encounter (HOSPITAL_COMMUNITY)
Admission: RE | Disposition: A | Discharge status: Home / Self Care | Payer: Medicare Other | Source: Home / Self Care | Attending: Internal Medicine

## 2018-07-18 ENCOUNTER — Other Ambulatory Visit: Payer: Self-pay

## 2018-07-18 ENCOUNTER — Ambulatory Visit (HOSPITAL_COMMUNITY)
Admission: RE | Admit: 2018-07-18 | Discharge: 2018-07-19 | Disposition: A | Discharge status: Home / Self Care | Payer: Medicare Other | Attending: Internal Medicine | Admitting: Internal Medicine

## 2018-07-18 DIAGNOSIS — Z950 Presence of cardiac pacemaker: Secondary | ICD-10-CM | POA: Diagnosis not present

## 2018-07-18 DIAGNOSIS — E669 Obesity, unspecified: Secondary | ICD-10-CM | POA: Insufficient documentation

## 2018-07-18 DIAGNOSIS — I480 Paroxysmal atrial fibrillation: Secondary | ICD-10-CM | POA: Diagnosis not present

## 2018-07-18 DIAGNOSIS — Z87891 Personal history of nicotine dependence: Secondary | ICD-10-CM | POA: Insufficient documentation

## 2018-07-18 DIAGNOSIS — E279 Disorder of adrenal gland, unspecified: Secondary | ICD-10-CM | POA: Insufficient documentation

## 2018-07-18 DIAGNOSIS — I495 Sick sinus syndrome: Secondary | ICD-10-CM

## 2018-07-18 DIAGNOSIS — Z79899 Other long term (current) drug therapy: Secondary | ICD-10-CM | POA: Insufficient documentation

## 2018-07-18 DIAGNOSIS — Z959 Presence of cardiac and vascular implant and graft, unspecified: Secondary | ICD-10-CM

## 2018-07-18 DIAGNOSIS — R55 Syncope and collapse: Secondary | ICD-10-CM | POA: Insufficient documentation

## 2018-07-18 DIAGNOSIS — K449 Diaphragmatic hernia without obstruction or gangrene: Secondary | ICD-10-CM | POA: Diagnosis not present

## 2018-07-18 DIAGNOSIS — M4856XA Collapsed vertebra, not elsewhere classified, lumbar region, initial encounter for fracture: Secondary | ICD-10-CM | POA: Insufficient documentation

## 2018-07-18 DIAGNOSIS — Z885 Allergy status to narcotic agent status: Secondary | ICD-10-CM | POA: Diagnosis not present

## 2018-07-18 DIAGNOSIS — K219 Gastro-esophageal reflux disease without esophagitis: Secondary | ICD-10-CM | POA: Diagnosis not present

## 2018-07-18 DIAGNOSIS — Z7901 Long term (current) use of anticoagulants: Secondary | ICD-10-CM | POA: Insufficient documentation

## 2018-07-18 HISTORY — PX: PACEMAKER IMPLANT: EP1218

## 2018-07-18 HISTORY — PX: LOOP RECORDER REMOVAL: EP1215

## 2018-07-18 HISTORY — DX: Sick sinus syndrome: I49.5

## 2018-07-18 LAB — SURGICAL PCR SCREEN
MRSA, PCR: NEGATIVE
Staphylococcus aureus: NEGATIVE

## 2018-07-18 SURGERY — PACEMAKER IMPLANT

## 2018-07-18 MED ORDER — MIDAZOLAM HCL 5 MG/5ML IJ SOLN
INTRAMUSCULAR | Status: DC | PRN
  Administered 2018-07-18: 1 mg via INTRAVENOUS

## 2018-07-18 MED ORDER — MIDAZOLAM HCL 5 MG/5ML IJ SOLN
INTRAMUSCULAR | Status: AC
  Filled 2018-07-18: qty 5

## 2018-07-18 MED ORDER — HYDROCODONE-ACETAMINOPHEN 5-325 MG PO TABS: 1.0000 | ORAL_TABLET | ORAL | Status: DC | PRN

## 2018-07-18 MED ORDER — HYDROCODONE-ACETAMINOPHEN 5-325 MG PO TABS
1.0000 | ORAL_TABLET | ORAL | Status: DC | PRN
  Administered 2018-07-18 – 2018-07-19 (×3): 1 via ORAL
  Filled 2018-07-18 (×3): qty 1

## 2018-07-18 MED ORDER — CEFAZOLIN SODIUM-DEXTROSE 2-4 GM/100ML-% IV SOLN
2.0000 g | INTRAVENOUS | Status: AC
  Administered 2018-07-18: 2 g via INTRAVENOUS

## 2018-07-18 MED ORDER — CHLORHEXIDINE GLUCONATE 4 % EX LIQD
60.0000 mL | Freq: Once | CUTANEOUS | Status: DC
  Administered 2018-07-18: 4 via TOPICAL
  Filled 2018-07-18: qty 60

## 2018-07-18 MED ORDER — LIDOCAINE HCL (PF) 1 % IJ SOLN
INTRAMUSCULAR | Status: AC
  Filled 2018-07-18: qty 60

## 2018-07-18 MED ORDER — CEFAZOLIN SODIUM-DEXTROSE 2-4 GM/100ML-% IV SOLN
INTRAVENOUS | Status: AC
  Filled 2018-07-18: qty 100

## 2018-07-18 MED ORDER — SODIUM CHLORIDE 0.9 % IV SOLN: 250.0000 mL | INTRAVENOUS | Status: DC | PRN

## 2018-07-18 MED ORDER — HEPARIN (PORCINE) IN NACL 1000-0.9 UT/500ML-% IV SOLN
INTRAVENOUS | Status: DC | PRN
  Administered 2018-07-18: 500 mL

## 2018-07-18 MED ORDER — FENTANYL CITRATE (PF) 100 MCG/2ML IJ SOLN
INTRAMUSCULAR | Status: AC
  Filled 2018-07-18: qty 2

## 2018-07-18 MED ORDER — SODIUM CHLORIDE 0.9% FLUSH: 3.0000 mL | INTRAVENOUS | Status: DC | PRN

## 2018-07-18 MED ORDER — LIDOCAINE HCL (PF) 1 % IJ SOLN
INTRAMUSCULAR | Status: DC | PRN
  Administered 2018-07-18: 80 mL

## 2018-07-18 MED ORDER — MUPIROCIN 2 % EX OINT
1.0000 "application " | TOPICAL_OINTMENT | Freq: Once | CUTANEOUS | Status: AC
  Administered 2018-07-18: 1 via TOPICAL
  Filled 2018-07-18 (×2): qty 22

## 2018-07-18 MED ORDER — LIDOCAINE HCL (PF) 1 % IJ SOLN
INTRAMUSCULAR | Status: AC
  Filled 2018-07-18: qty 30

## 2018-07-18 MED ORDER — SODIUM CHLORIDE 0.9 % IV SOLN
INTRAVENOUS | Status: DC
  Administered 2018-07-18: 10:00:00 via INTRAVENOUS

## 2018-07-18 MED ORDER — ONDANSETRON HCL 4 MG/2ML IJ SOLN: 4.0000 mg | Freq: Four times a day (QID) | INTRAMUSCULAR | Status: DC | PRN

## 2018-07-18 MED ORDER — SODIUM CHLORIDE 0.9 % IV SOLN
80.0000 mg | INTRAVENOUS | Status: AC
  Administered 2018-07-18: 80 mg

## 2018-07-18 MED ORDER — ACETAMINOPHEN 325 MG PO TABS: 325.0000 mg | ORAL_TABLET | ORAL | Status: DC | PRN

## 2018-07-18 MED ORDER — HEPARIN (PORCINE) IN NACL 1000-0.9 UT/500ML-% IV SOLN
INTRAVENOUS | Status: AC
  Filled 2018-07-18: qty 500

## 2018-07-18 MED ORDER — SODIUM CHLORIDE 0.9% FLUSH
3.0000 mL | Freq: Two times a day (BID) | INTRAVENOUS | Status: DC
  Administered 2018-07-18 – 2018-07-19 (×3): 3 mL via INTRAVENOUS

## 2018-07-18 MED ORDER — IOHEXOL 350 MG/ML SOLN
INTRAVENOUS | Status: DC | PRN
  Administered 2018-07-18: 15 mL via INTRAVENOUS

## 2018-07-18 MED ORDER — CEFAZOLIN SODIUM-DEXTROSE 1-4 GM/50ML-% IV SOLN
1.0000 g | Freq: Four times a day (QID) | INTRAVENOUS | Status: AC
  Administered 2018-07-18 – 2018-07-19 (×3): 1 g via INTRAVENOUS
  Filled 2018-07-18 (×4): qty 50

## 2018-07-18 MED ORDER — SODIUM CHLORIDE 0.9 % IV SOLN
INTRAVENOUS | Status: AC
  Filled 2018-07-18: qty 2

## 2018-07-18 SURGICAL SUPPLY — 9 items
CABLE SURGICAL S-101-97-12 (CABLE) ×3 IMPLANT
IPG PACE AZUR XT DR MRI W1DR01 (Pacemaker) IMPLANT
LEAD CAPSURE NOVUS 5076-52CM (Lead) ×2 IMPLANT
LEAD CAPSURE NOVUS 5076-58CM (Lead) ×2 IMPLANT
PACE AZURE XT DR MRI W1DR01 (Pacemaker) ×3 IMPLANT
PACK LOOP INSERTION (CUSTOM PROCEDURE TRAY) ×3 IMPLANT
PAD PRO RADIOLUCENT 2001M-C (PAD) ×3 IMPLANT
SHEATH CLASSIC 7F (SHEATH) ×4 IMPLANT
TRAY PACEMAKER INSERTION (PACKS) ×3 IMPLANT

## 2018-07-18 NOTE — Interval H&P Note (Signed)
History and Physical Interval Note:  07/18/2018 10:08 AM  [name]  has presented today for surgery, with the diagnosis of heart block.  The various methods of treatment have been discussed with the patient and family. After consideration of risks, benefits and other options for treatment, the patient has consented to  Procedure(s): PACEMAKER IMPLANT (N/A) LOOP RECORDER REMOVAL (N/A) as a surgical intervention.  The patient's history has been reviewed, patient examined, no change in status, stable for surgery.  I have reviewed the patient's chart and labs.  Questions were answered to the patient's satisfaction.    The patient has recurrent symptomatic sinus bradycardia with symptomatic pauses and syncope.  No reversible causes have been found.  I would therefore recommend pacemaker implantation at this time.  Risks, benefits, alternatives to pacemaker implantation were discussed in detail with the patient today. The patient understands that the risks include but are not limited to bleeding, infection, pneumothorax, perforation, tamponade, vascular damage, renal failure, MI, stroke, death,  and lead dislodgement and wishes to proceed. We also discussed ILR removal risks at length and will plan to remove his loop recorder today as well.  Thompson Grayer MD, [place] 07/18/2018 10:09 AM

## 2018-07-19 ENCOUNTER — Ambulatory Visit (HOSPITAL_COMMUNITY): Payer: Medicare Other

## 2018-07-19 ENCOUNTER — Encounter (HOSPITAL_COMMUNITY): Payer: Self-pay | Admitting: *Deleted

## 2018-07-19 DIAGNOSIS — R55 Syncope and collapse: Secondary | ICD-10-CM | POA: Diagnosis not present

## 2018-07-19 DIAGNOSIS — Z79899 Other long term (current) drug therapy: Secondary | ICD-10-CM | POA: Diagnosis not present

## 2018-07-19 DIAGNOSIS — Z7901 Long term (current) use of anticoagulants: Secondary | ICD-10-CM | POA: Diagnosis not present

## 2018-07-19 DIAGNOSIS — I495 Sick sinus syndrome: Secondary | ICD-10-CM | POA: Diagnosis not present

## 2018-07-19 DIAGNOSIS — M4856XA Collapsed vertebra, not elsewhere classified, lumbar region, initial encounter for fracture: Secondary | ICD-10-CM | POA: Diagnosis not present

## 2018-07-19 DIAGNOSIS — I480 Paroxysmal atrial fibrillation: Secondary | ICD-10-CM | POA: Diagnosis not present

## 2018-07-19 DIAGNOSIS — E279 Disorder of adrenal gland, unspecified: Secondary | ICD-10-CM | POA: Diagnosis not present

## 2018-07-19 DIAGNOSIS — Z87891 Personal history of nicotine dependence: Secondary | ICD-10-CM | POA: Diagnosis not present

## 2018-07-19 DIAGNOSIS — K449 Diaphragmatic hernia without obstruction or gangrene: Secondary | ICD-10-CM | POA: Diagnosis not present

## 2018-07-19 DIAGNOSIS — Z885 Allergy status to narcotic agent status: Secondary | ICD-10-CM | POA: Diagnosis not present

## 2018-07-19 DIAGNOSIS — Z950 Presence of cardiac pacemaker: Secondary | ICD-10-CM | POA: Diagnosis not present

## 2018-07-19 DIAGNOSIS — K219 Gastro-esophageal reflux disease without esophagitis: Secondary | ICD-10-CM | POA: Diagnosis not present

## 2018-07-19 LAB — BASIC METABOLIC PANEL
Anion gap: 10 (ref 5–15)
BUN: 8 mg/dL (ref 8–23)
CO2: 19 mmol/L — ABNORMAL LOW (ref 22–32)
Calcium: 9.1 mg/dL (ref 8.9–10.3)
Chloride: 108 mmol/L (ref 98–111)
Creatinine, Ser: 0.83 mg/dL (ref 0.61–1.24)
GFR calc Af Amer: >60 mL/min (ref >60)
GFR calc non Af Amer: >60 mL/min (ref >60)
Glucose, Bld: 117 mg/dL — ABNORMAL HIGH (ref 70–99)
Potassium: 3.6 mmol/L (ref 3.5–5.1)
Sodium: 137 mmol/L (ref 135–145)

## 2018-07-19 NOTE — Discharge Summary (Addendum)
ELECTROPHYSIOLOGY PROCEDURE DISCHARGE SUMMARY    Patient ID: Frank Levee.,  MRN: [id number], DOB/AGE: 77-Aug-1942 77 y.o.  Admit date: 07/18/2018 Discharge date: 07/19/2018  Primary Care Physician: [name], MD  Primary Cardiologist: Dr. Acie Fredrickson Electrophysiologist: Dr. Rayann Heman  Primary Discharge Diagnosis:  1. Syncope, recurrent near syncope 2. 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 Paroxysmal AFib     CHA2DS2Vasc is 2, for age, on Eliquis, appropriately dosed 2. Renal, adrenal mass     Note on previous hospitalization 3. CBP      T12, L1 compression fracture (old)   Allergies  Allergen Reactions  . Monosodium Glutamate Other (See Comments)    Caused headaches     Procedures This Admission:  1.  Loop monitor removal 2. Implantation of a MDT dual chamber PPM on 07/18/2018 by Dr Rayann Heman.  The patient received a Medtronic Azure XT DR MRI SureScan model [id number] (serial number [id number]) pacemaker,  Medtronic model [id number] (serial number [id number]) right atrial lead and a Medtronic model 5076- 58 (serial number [id number]) right ventricular lead  There were no immediate post procedure complications. CXR on 07/19/2018 demonstrated no pneumothorax status post device implantation.   Brief HPI: Frank Detty. is a 78 y.o. male followed out patient with h/o unexplained recurrent falls, had ILR placed, with a full syncopal spell he was found to have marked sinus pause of 10 seconds.  Recommended loop extraction and PPM implant.  Past medical history includes above as well.   Risks, benefits, and alternatives to PPM implantation were reviewed with the patient who wished to proceed.   Hospital Course:  The patient was admitted and underwent implantation of a PPM with details as outlined above. He was monitored on telemetry overnight which demonstrated SR, rare A paced beat, rare PVC.  Left chest  was without hematoma or ecchymosis.  The device transmission this morning with stable measurements.  CXR was obtained and demonstrated no pneumothorax status post device implantation.  Wound care, arm mobility, and restrictions were reviewed with the patient.  The patient denies any CP or SOB, he was cleared by Dr. Rayann Heman and considered stable for discharge to home.   BP checked by myself is 136/80   Physical Exam: Vitals:   07/18/18 1525 07/18/18 1540 07/18/18 1948 07/19/18 0545  BP: 129/77 137/81 136/85 (!) 160/97  Pulse: 72 70 82 84  Resp:   18 20  Temp:   98.2 F (36.8 C) 97.7 F (36.5 C)  TempSrc:   Oral Oral  SpO2: 97% 97% 98% 97%  Weight:    84.4 kg  Height:        GEN- The patient is well appearing, alert and oriented x 3 today.   HEENT: normocephalic, atraumatic; sclera clear, conjunctiva pink; hearing intact; neck supple, no JVP Lungs-  CTA b/l, normal work of breathing.  No wheezes, rales, rhonchi Heart- RRR, no murmurs, rubs or gallops, PMI not laterally displaced GI- not examined Extremities- no clubbing, cyanosis, or edema MS- no significant deformity or atrophy Skin- warm and dry, no rash or lesion, loop explant site and PPM implant sites are stable, without hematoma/ecchymosis Psych- euthymic mood, full affect Neuro- no gross deficits   Labs:   Lab Results  Component Value Date   WBC 8.4 06/29/2018   HGB 13.0 06/29/2018   HCT 36.8 (L) 06/29/2018   MCV 92.9 06/29/2018   PLT 125 (L) 06/29/2018  No results for input(s): NA, K, CL, CO2, BUN, CREATININE, CALCIUM, PROT, BILITOT, ALKPHOS, ALT, AST, GLUCOSE in the last 168 hours.  Invalid input(s): LABALBU  Discharge Medications:  Allergies as of 07/19/2018      Reactions   Monosodium Glutamate Other (See Comments)   Caused headaches      Medication List    TAKE these medications   atorvastatin 20 MG tablet Commonly known as:  LIPITOR Take 1 tablet (20 mg total) by mouth daily.   Eliquis 5 MG Tabs  tablet Generic drug:  apixaban Take 1 tablet by mouth twice daily What changed:  how much to take Notes to patient:  Friday 07/21/2018    esomeprazole 40 MG capsule Commonly known as:  NEXIUM Take 1 capsule (40 mg total) by mouth daily. What changed:  when to take this   HYDROcodone-acetaminophen 5-325 MG tablet Commonly known as:  NORCO/VICODIN Take 1 tablet by mouth every 4 (four) hours as needed for moderate pain.   omega-3 acid ethyl esters 1 g capsule Commonly known as:  LOVAZA Take 2 capsules by mouth twice daily   phenylephrine 1 % nasal spray Commonly known as:  NEO-SYNEPHRINE Place 1 drop into both nostrils every 6 (six) hours as needed for congestion.   Refresh Liquigel 1 % Gel Generic drug:  Carboxymethylcellulose Sodium Place 1 drop into both eyes at bedtime.   REFRESH TEARS OP Place 1 drop into both eyes daily as needed (dry eyes).   solifenacin 10 MG tablet Commonly known as:  VESICARE Take 1 tablet (10 mg total) by mouth daily.       Disposition: Home   Follow-up Information    [facility].   Specialty:  Cardiology Why:  07/27/2018 @ 12;00PM (noon), wound check visit Contact information: [address] [place]       Thompson Grayer, MD Follow up.   Specialty:  Cardiology Why:  10/18/2018 @ 10:30PM Contact information: [place] [address] [phone]           Duration of Discharge Encounter: Greater than 30 minutes including physician time.  Venetia Night, PA-C 07/19/2018 9:02 AM

## 2018-07-19 NOTE — Discharge Instructions (Signed)
Supplemental Discharge Instructions for  Pacemaker/Defibrillator Patients  Activity No heavy lifting or vigorous activity with your left/right arm for 6 to 8 weeks.  Do not raise your left/right arm above your head for one week.  Gradually raise your affected arm as drawn below.              07/22/2018                07/23/2018                 07/24/2018              07/25/2018 __  NO DRIVING for  6 weeks  If no recurrent fainting spells as discussed with you by Dr. Rayann Heman .  WOUND CARE - Keep the wound area clean and dry.  Do not get the wounds wet, no showers until cleared to at your wound check visit . - The tape/steri-strips on your wound will fall off; do not pull them off.  No bandage is needed on the site.  DO  NOT apply any creams, oils, or ointments to the wound area. - If you notice any drainage or discharge from the wound, any swelling or bruising at the site, or you develop a fever > 101? F after you are discharged home, call the office at once.  Special Instructions - You are still able to use cellular telephones; use the ear opposite the side where you have your pacemaker/defibrillator.  Avoid carrying your cellular phone near your device. - When traveling through airports, show security personnel your identification card to avoid being screened in the metal detectors.  Ask the security personnel to use the hand wand. - Avoid arc welding equipment, MRI testing (magnetic resonance imaging), TENS units (transcutaneous nerve stimulators).  Call the office for questions about other devices. - Avoid electrical appliances that are in poor condition or are not properly grounded. - Microwave ovens are safe to be near or to operate.    Heart-Healthy Eating Plan Heart-healthy meal planning includes:  Eating less unhealthy fats.  Eating more healthy fats.  Making other changes in your diet. Talk with your doctor or a diet specialist (dietitian) to create an eating plan that is  right for you. What is my plan? Your doctor may recommend an eating plan that includes:  Total fat: ______% or less of total calories a day.  Saturated fat: ______% or less of total calories a day.  Cholesterol: less than _________mg a day. What are tips for following this plan? Cooking Avoid frying your food. Try to bake, boil, grill, or broil it instead. You can also reduce fat by:  Removing the skin from poultry.  Removing all visible fats from meats.  Steaming vegetables in water or broth. Meal planning   At meals, divide your plate into four equal parts: ? Fill one-half of your plate with vegetables and green salads. ? Fill one-fourth of your plate with whole grains. ? Fill one-fourth of your plate with lean protein foods.  Eat 4-5 servings of vegetables per day. A serving of vegetables is: ? 1 cup of raw or cooked vegetables. ? 2 cups of raw leafy greens.  Eat 4-5 servings of fruit per day. A serving of fruit is: ? 1 medium whole fruit. ?  cup of dried fruit. ?  cup of fresh, frozen, or canned fruit. ?  cup of 100% fruit juice.  Eat more foods that have soluble fiber. These are apples,  broccoli, carrots, beans, peas, and barley. Try to get 20-30 g of fiber per day.  Eat 4-5 servings of nuts, legumes, and seeds per week: ? 1 serving of dried beans or legumes equals  cup after being cooked. ? 1 serving of nuts is  cup. ? 1 serving of seeds equals 1 tablespoon. General information  Eat more home-cooked food. Eat less restaurant, buffet, and fast food.  Limit or avoid alcohol.  Limit foods that are high in starch and sugar.  Avoid fried foods.  Lose weight if you are overweight.  Keep track of how much salt (sodium) you eat. This is important if you have high blood pressure. Ask your doctor to tell you more about this.  Try to add vegetarian meals each week. Fats  Choose healthy fats. These include olive oil and canola oil, flaxseeds, walnuts,  almonds, and seeds.  Eat more omega-3 fats. These include salmon, mackerel, sardines, tuna, flaxseed oil, and ground flaxseeds. Try to eat fish at least 2 times each week.  Check food labels. Avoid foods with trans fats or high amounts of saturated fat.  Limit saturated fats. ? These are often found in animal products, such as meats, butter, and cream. ? These are also found in plant foods, such as palm oil, palm kernel oil, and coconut oil.  Avoid foods with partially hydrogenated oils in them. These have trans fats. Examples are stick margarine, some tub margarines, cookies, crackers, and other baked goods. What foods can I eat? Fruits All fresh, canned (in natural juice), or frozen fruits. Vegetables Fresh or frozen vegetables (raw, steamed, roasted, or grilled). Green salads. Grains Most grains. Choose whole wheat and whole grains most of the time. Rice and pasta, including brown rice and pastas made with whole wheat. Meats and other proteins Lean, well-trimmed beef, veal, pork, and lamb. Chicken and [place] without skin. All fish and shellfish. Wild duck, rabbit, pheasant, and venison. Egg whites or low-cholesterol egg substitutes. Dried beans, peas, lentils, and tofu. Seeds and most nuts. Dairy Low-fat or nonfat cheeses, including ricotta and mozzarella. Skim or 1% milk that is liquid, powdered, or evaporated. Buttermilk that is made with low-fat milk. Nonfat or low-fat yogurt. Fats and oils Non-hydrogenated (trans-free) margarines. Vegetable oils, including soybean, sesame, sunflower, olive, peanut, safflower, corn, canola, and cottonseed. Salad dressings or mayonnaise made with a vegetable oil. Beverages Mineral water. Coffee and tea. Diet carbonated beverages. Sweets and desserts Sherbet, gelatin, and fruit ice. Small amounts of dark chocolate. Limit all sweets and desserts. Seasonings and condiments All seasonings and condiments. The items listed above may not be a complete  list of foods and drinks you can eat. Contact a dietitian for more options. What foods should I avoid? Fruits Canned fruit in heavy syrup. Fruit in cream or butter sauce. Fried fruit. Limit coconut. Vegetables Vegetables cooked in cheese, cream, or butter sauce. Fried vegetables. Grains Breads that are made with saturated or trans fats, oils, or whole milk. Croissants. Sweet rolls. Donuts. High-fat crackers, such as cheese crackers. Meats and other proteins Fatty meats, such as hot dogs, ribs, sausage, bacon, rib-eye roast or steak. High-fat deli meats, such as salami and bologna. Caviar. Domestic duck and goose. Organ meats, such as liver. Dairy Cream, sour cream, cream cheese, and creamed cottage cheese. Whole-milk cheeses. Whole or 2% milk that is liquid, evaporated, or condensed. Whole buttermilk. Cream sauce or high-fat cheese sauce. Yogurt that is made from whole milk. Fats and oils Meat fat, or shortening. Cocoa butter,  hydrogenated oils, palm oil, coconut oil, palm kernel oil. Solid fats and shortenings, including bacon fat, salt pork, lard, and butter. Nondairy cream substitutes. Salad dressings with cheese or sour cream. Beverages Regular sodas and juice drinks with added sugar. Sweets and desserts Frosting. Pudding. Cookies. Cakes. Pies. Milk chocolate or white chocolate. Buttered syrups. Full-fat ice cream or ice cream drinks. The items listed above may not be a complete list of foods and drinks to avoid. Contact a dietitian for more information. Summary  Heart-healthy meal planning includes eating less unhealthy fats, eating more healthy fats, and making other changes in your diet.  Eat a balanced diet. This includes fruits and vegetables, low-fat or nonfat dairy, lean protein, nuts and legumes, whole grains, and heart-healthy oils and fats. This information is not intended to replace advice given to you by your health care provider. Make sure you discuss any questions you have  with your health care provider. Document Released: 08/17/2011 Document Revised: 03/25/2017 Document Reviewed: 03/25/2017 Elsevier Interactive Patient Education  2019 [employer].

## 2018-07-19 NOTE — Progress Notes (Signed)
CXR reveals stable leads, no ptx.  Will discharge to home with routine wound care and follow-up. May return driving if no further syncope in 6 weeks.  Thompson Grayer MD, [place] 07/19/2018 8:17 AM

## 2018-07-20 DIAGNOSIS — D4102 Neoplasm of uncertain behavior of left kidney: Secondary | ICD-10-CM | POA: Diagnosis not present

## 2018-07-20 DIAGNOSIS — D3500 Benign neoplasm of unspecified adrenal gland: Secondary | ICD-10-CM | POA: Diagnosis not present

## 2018-07-21 ENCOUNTER — Telehealth: Payer: Self-pay

## 2018-07-21 ENCOUNTER — Telehealth: Payer: Medicare Other | Admitting: Internal Medicine

## 2018-07-21 DIAGNOSIS — I714 Abdominal aortic aneurysm, without rupture: Secondary | ICD-10-CM | POA: Diagnosis not present

## 2018-07-21 DIAGNOSIS — S22080D Wedge compression fracture of T11-T12 vertebra, subsequent encounter for fracture with routine healing: Secondary | ICD-10-CM | POA: Diagnosis not present

## 2018-07-21 DIAGNOSIS — K219 Gastro-esophageal reflux disease without esophagitis: Secondary | ICD-10-CM | POA: Diagnosis not present

## 2018-07-21 DIAGNOSIS — I480 Paroxysmal atrial fibrillation: Secondary | ICD-10-CM | POA: Diagnosis not present

## 2018-07-21 DIAGNOSIS — S32010D Wedge compression fracture of first lumbar vertebra, subsequent encounter for fracture with routine healing: Secondary | ICD-10-CM | POA: Diagnosis not present

## 2018-07-21 DIAGNOSIS — Z95818 Presence of other cardiac implants and grafts: Secondary | ICD-10-CM | POA: Diagnosis not present

## 2018-07-21 DIAGNOSIS — Z87891 Personal history of nicotine dependence: Secondary | ICD-10-CM | POA: Diagnosis not present

## 2018-07-21 DIAGNOSIS — W19XXXD Unspecified fall, subsequent encounter: Secondary | ICD-10-CM | POA: Diagnosis not present

## 2018-07-21 DIAGNOSIS — E278 Other specified disorders of adrenal gland: Secondary | ICD-10-CM | POA: Diagnosis not present

## 2018-07-21 DIAGNOSIS — N2889 Other specified disorders of kidney and ureter: Secondary | ICD-10-CM | POA: Diagnosis not present

## 2018-07-21 NOTE — Telephone Encounter (Signed)
Pt in for packer maker device. Pt to follow up with cardiology.

## 2018-07-27 ENCOUNTER — Other Ambulatory Visit: Payer: Self-pay

## 2018-07-27 ENCOUNTER — Telehealth: Payer: Self-pay | Admitting: Internal Medicine

## 2018-07-27 ENCOUNTER — Ambulatory Visit (INDEPENDENT_AMBULATORY_CARE_PROVIDER_SITE_OTHER): Payer: Medicare Other | Admitting: *Deleted

## 2018-07-27 DIAGNOSIS — I495 Sick sinus syndrome: Secondary | ICD-10-CM

## 2018-07-27 DIAGNOSIS — R55 Syncope and collapse: Secondary | ICD-10-CM

## 2018-07-27 LAB — CUP PACEART INCLINIC DEVICE CHECK
Battery Remaining Longevity: 178 mo
Battery Voltage: 3.21 V
Brady Statistic AP VP Percent: 0.02 %
Brady Statistic AP VS Percent: 12.29 %
Brady Statistic AS VP Percent: 0.02 %
Brady Statistic AS VS Percent: 87.67 %
Brady Statistic RA Percent Paced: 12.34 %
Brady Statistic RV Percent Paced: 0.04 %
Date Time Interrogation Session: 20200528142041
Implantable Lead Implant Date: 20200519
Implantable Lead Implant Date: 20200519
Implantable Lead Location: 753859
Implantable Lead Location: 753860
Implantable Lead Model: 5076
Implantable Lead Model: 5076
Implantable Pulse Generator Implant Date: 20200519
Lead Channel Impedance Value: 304 Ohm
Lead Channel Impedance Value: 418 Ohm
Lead Channel Impedance Value: 418 Ohm
Lead Channel Impedance Value: 665 Ohm
Lead Channel Pacing Threshold Amplitude: 0.625 V
Lead Channel Pacing Threshold Amplitude: 0.75 V
Lead Channel Pacing Threshold Pulse Width: 0.4 ms
Lead Channel Pacing Threshold Pulse Width: 0.4 ms
Lead Channel Sensing Intrinsic Amplitude: 11.75 mV
Lead Channel Sensing Intrinsic Amplitude: 13 mV
Lead Channel Sensing Intrinsic Amplitude: 3.375 mV
Lead Channel Sensing Intrinsic Amplitude: 4.25 mV
Lead Channel Setting Pacing Amplitude: 3.5 V
Lead Channel Setting Pacing Amplitude: 3.5 V
Lead Channel Setting Pacing Pulse Width: 0.4 ms
Lead Channel Setting Sensing Sensitivity: 2 mV

## 2018-07-27 NOTE — Telephone Encounter (Signed)
Requested manual transmission

## 2018-07-27 NOTE — Telephone Encounter (Signed)
Transmission received.

## 2018-07-27 NOTE — Progress Notes (Signed)
Wound check appointment. Steri-strips removed. Wound without redness , small amount of edema, soft. Incision edges approximated, wound well healed. Education concerning bleeding and drainage. Normal device function. Thresholds, sensing, and impedances consistent with implant measurements. Device programmed at 3.5V/auto capture programmed on for extra safety margin until 3 month visit. Histogram distribution appropriate for patient and level of activity. No mode switches and 1 high ventricular rates noted, EGM shows 1:1 SVT. Patient educated about wound care, arm mobility, lifting restrictions. ROV in 3 months with implanting physician.

## 2018-08-04 ENCOUNTER — Telehealth: Payer: Self-pay | Admitting: Internal Medicine

## 2018-08-04 NOTE — Telephone Encounter (Signed)
Dr Rayann Heman this patient just had a pacemaker implanted 07/19/2018.  Any special instructions regarding pre op clearance?  I will forward to pharmacy for recommendations on holding Eliquis as well.  Thanks  Kerin Ransom PA-C 08/04/2018 1:39 PM

## 2018-08-04 NOTE — Telephone Encounter (Signed)
Patient with diagnosis of afib on Eliquis for anticoagulation.    Procedure:  Renal mass, Ureteroscopy and biopsy   Date of procedure: TBD  CHADS2-VASc score of  3 (CHF, HTN, AGE, DM2, stroke/tia x 2, CAD, AGE, male)  CrCl 1ml/min  Per office protocol, patient can hold Eliquis for 2 days prior to procedure.

## 2018-08-04 NOTE — Telephone Encounter (Signed)
° °  [facility] Pre-operative Risk Assessment    Request for surgical clearance:  1. What type of surgery is being performed? Renal mass, Ureteroscopy and biopsy    2. When is this surgery scheduled? TBD, goal within the next month  3. What type of clearance is required (medical clearance vs. Pharmacy clearance to hold med vs. Both)? Both.   4. Are there any medications that need to be held prior to surgery and how long?Eliquis tbd by the dr  5. Practice name and name of physician performing surgery? Dr. Gloriann Loan, [facility]   6. What is your office phone number: [phone]   7.   What is your office fax number: [phone]  8.   Anesthesia type (None, local, MAC, general) ? general   Frank [name] 08/04/2018, 10:31 AM  _________________________________________________________________   (provider comments below)

## 2018-08-07 NOTE — Telephone Encounter (Signed)
Ok to proceed from my standpoint.

## 2018-08-08 ENCOUNTER — Other Ambulatory Visit: Payer: Self-pay | Admitting: Urology

## 2018-08-08 NOTE — Telephone Encounter (Signed)
   Primary Cardiologist: Mertie Moores, MD  Chart reviewed as part of pre-operative protocol coverage. Patient was contacted 08/08/2018 in reference to pre-operative risk assessment for pending surgery as outlined below.  Sanders Manninen. was last seen on 07/18/2018 by Dr. Rayann Heman for pacemaker insertion.  Since that day, [name] has done well. He is very pleased at how much better he feels with the pacemaker. He has no new cardiac complaints.   Per Dr. Rayann Heman, it is OK to proceed with surgery from an EP standpoint. With no prior history of CAD or heart failure and no new symptoms of either, based on ACC/AHA guidelines, the patient would be at acceptable risk for the planned procedure without further cardiovascular testing.   According to our pharmacy recommendations: Patient with diagnosis of afib on Eliquis for anticoagulation.    Procedure: Renal mass, Ureteroscopy and biopsy Date of procedure: TBD  CHADS2-VASc score of  3 (CHF, HTN, AGE, DM2, stroke/tia x 2, CAD, AGE, male)  CrCl 90ml/min  Per office protocol, patient can hold Eliquis for 2 days prior to procedure.     I will route this recommendation to the requesting party via Epic fax function and remove from pre-op pool.  Please call with questions.  Daune Perch, NP 08/08/2018, 9:12 AM

## 2018-08-11 NOTE — Patient Instructions (Addendum)
[name]    Your procedure is scheduled on: 08-16-2018   Report to [facility] Main  Entrance    Report to Admitting at [place] 19 TEST ON 08-14-2018 @ 1:15 PM .THIS TEST MUST BE DONE BEFORE SURGERY, COME TO [place]. ONCE TEST IS COMPLETED, PLEASE BEGIN THE QUARANTINE INSTRUCTIONS AS INDICATED IN YOUR  HANDOUT   Call this number if you have problems the morning of surgery [phone]    Remember: Do not eat food or drink liquids :After Midnight. BRUSH YOUR TEETH MORNING OF SURGERY AND RINSE YOUR MOUTH OUT, NO CHEWING GUM CANDY OR MINTS.     Take these medicines the morning of surgery with A SIP OF WATER: NEXIUM, VESICARE                               You may not have any metal on your body including hair pins and              piercings     Do not wear jewelry, cologen, lotions, powders or deodorant                          Men may shave face and neck.   Do not bring valuables to the hospital. [place].      Patients discharged the day of surgery will not be allowed to drive home. IF YOU ARE HAVING SURGERY AND GOING HOME THE SAME DAY, YOU MUST HAVE AN ADULT TO DRIVE YOU HOME AND BE WITH YOU FOR 24 HOURS. YOU MAY GO HOME BY TAXI OR UBER OR ORTHERWISE, BUT AN ADULT MUST ACCOMPANY YOU HOME AND STAY WITH YOU FOR 24 HOURS.  Name and phone number of your driver: Nas Wafer [phone]               Please read over the following fact sheets you were given: _____________________________________________________________________             [facility] - Preparing for Surgery Before surgery, you can play an important role.  Because skin is not sterile, your skin needs to be as free of germs as possible.  You can reduce the number of germs on your skin by washing with CHG (chlorahexidine gluconate) soap before surgery.  CHG is an  antiseptic cleaner which kills germs and bonds with the skin to continue killing germs even after washing. Please DO NOT use if you have an allergy to CHG or antibacterial soaps.  If your skin becomes reddened/irritated stop using the CHG and inform your nurse when you arrive at Short Stay. Do not shave (including legs and underarms) for at least 48 hours prior to the first CHG shower.  You may shave your face/neck. Please follow these instructions carefully:  1.  Shower with CHG Soap the night before surgery and the  morning of Surgery.  2.  If you choose to wash your hair, wash your hair first as usual with your  normal  shampoo.  3.  After you shampoo, rinse your hair and body thoroughly to remove the  shampoo.  4.  Use CHG as you would any other liquid soap.  You can apply chg directly  to the skin and wash                       Gently with a scrungie or clean washcloth.  5.  Apply the CHG Soap to your body ONLY FROM THE NECK DOWN.   Do not use on face/ open                           Wound or open sores. Avoid contact with eyes, ears mouth and genitals (private parts).                       Wash face,  Genitals (private parts) with your normal soap.             6.  Wash thoroughly, paying special attention to the area where your surgery  will be performed.  7.  Thoroughly rinse your body with warm water from the neck down.  8.  DO NOT shower/wash with your normal soap after using and rinsing off  the CHG Soap.                9.  Pat yourself dry with a clean towel.            10.  Wear clean pajamas.            11.  Place clean sheets on your bed the night of your first shower and do not  sleep with pets. Day of Surgery : Do not apply any lotions/deodorants the morning of surgery.  Please wear clean clothes to the hospital/surgery center.  FAILURE TO FOLLOW THESE INSTRUCTIONS MAY RESULT IN THE CANCELLATION OF YOUR SURGERY PATIENT  SIGNATURE_________________________________  NURSE SIGNATURE__________________________________  ________________________________________________________________________

## 2018-08-11 NOTE — Progress Notes (Signed)
CARDIAC CLEARANCE NOTE Daune Perch NP 08-04-2018 ON CHART AND Epic, PER JANINE HAMMOND NP MAY STOP ELIQUIS 2 DAYS PRIOR TO SURGERY ON CHART AND EPIC ECHO 06-26-2018 EPIC DEVICE CHECK 07-27-2018 Epic CHEST XRAY 07-19-18 Epic EKG 07-27-26 ON CHART DR Rayann Heman PACER ORDERS ON CHART NO REP NEEDED FOR DAY OF SURGERY FOR [employer] PACEMAKER

## 2018-08-11 NOTE — Progress Notes (Signed)
NO ANSWER CALLED HOME AND CELL TO SCHEDULE 08-12-18 COVID TEST

## 2018-08-12 ENCOUNTER — Other Ambulatory Visit (HOSPITAL_COMMUNITY): Payer: Medicare Other

## 2018-08-14 ENCOUNTER — Other Ambulatory Visit: Payer: Self-pay

## 2018-08-14 ENCOUNTER — Encounter (HOSPITAL_COMMUNITY): Payer: Self-pay

## 2018-08-14 ENCOUNTER — Other Ambulatory Visit (HOSPITAL_COMMUNITY)
Admission: RE | Admit: 2018-08-14 | Discharge: 2018-08-14 | Disposition: A | Discharge status: Home / Self Care | Payer: Medicare Other | Source: Ambulatory Visit | Attending: Urology | Admitting: Urology

## 2018-08-14 ENCOUNTER — Encounter (HOSPITAL_COMMUNITY)
Admission: RE | Admit: 2018-08-14 | Discharge: 2018-08-14 | Disposition: A | Discharge status: Home / Self Care | Payer: Medicare Other | Source: Ambulatory Visit | Attending: Urology | Admitting: Urology

## 2018-08-14 DIAGNOSIS — I10 Essential (primary) hypertension: Secondary | ICD-10-CM | POA: Diagnosis not present

## 2018-08-14 DIAGNOSIS — Z950 Presence of cardiac pacemaker: Secondary | ICD-10-CM | POA: Diagnosis not present

## 2018-08-14 DIAGNOSIS — N2889 Other specified disorders of kidney and ureter: Secondary | ICD-10-CM | POA: Diagnosis not present

## 2018-08-14 DIAGNOSIS — D49512 Neoplasm of unspecified behavior of left kidney: Secondary | ICD-10-CM | POA: Insufficient documentation

## 2018-08-14 DIAGNOSIS — Z1159 Encounter for screening for other viral diseases: Secondary | ICD-10-CM | POA: Insufficient documentation

## 2018-08-14 DIAGNOSIS — I4891 Unspecified atrial fibrillation: Secondary | ICD-10-CM | POA: Diagnosis not present

## 2018-08-14 DIAGNOSIS — Z01812 Encounter for preprocedural laboratory examination: Secondary | ICD-10-CM | POA: Insufficient documentation

## 2018-08-14 DIAGNOSIS — K219 Gastro-esophageal reflux disease without esophagitis: Secondary | ICD-10-CM | POA: Diagnosis not present

## 2018-08-14 DIAGNOSIS — Z7901 Long term (current) use of anticoagulants: Secondary | ICD-10-CM | POA: Diagnosis not present

## 2018-08-14 DIAGNOSIS — Z87891 Personal history of nicotine dependence: Secondary | ICD-10-CM | POA: Diagnosis not present

## 2018-08-14 DIAGNOSIS — Z79899 Other long term (current) drug therapy: Secondary | ICD-10-CM | POA: Diagnosis not present

## 2018-08-14 LAB — BASIC METABOLIC PANEL
Anion gap: 7 (ref 5–15)
BUN: 9 mg/dL (ref 8–23)
CO2: 24 mmol/L (ref 22–32)
Calcium: 9.1 mg/dL (ref 8.9–10.3)
Chloride: 110 mmol/L (ref 98–111)
Creatinine, Ser: 0.85 mg/dL (ref 0.61–1.24)
GFR calc Af Amer: >60 mL/min (ref >60)
GFR calc non Af Amer: >60 mL/min (ref >60)
Glucose, Bld: 126 mg/dL — ABNORMAL HIGH (ref 70–99)
Potassium: 3.8 mmol/L (ref 3.5–5.1)
Sodium: 141 mmol/L (ref 135–145)

## 2018-08-14 LAB — CBC
HCT: 43.5 % (ref 39.0–52.0)
Hemoglobin: 14.7 g/dL (ref 13.0–17.0)
MCH: 32.8 pg (ref 26.0–34.0)
MCHC: 33.8 g/dL (ref 30.0–36.0)
MCV: 97.1 fL (ref 80.0–100.0)
Platelets: 192 10*3/uL (ref 150–400)
RBC: 4.48 MIL/uL (ref 4.22–5.81)
RDW: 13.7 % (ref 11.5–15.5)
WBC: 7.6 10*3/uL (ref 4.0–10.5)
nRBC: 0 % (ref 0.0–0.2)

## 2018-08-14 NOTE — Progress Notes (Signed)
Had scheduled covid 08-12-2018 , wife called back and left message they were going to do covid Monday did not want to quarantine for weekend

## 2018-08-14 NOTE — Progress Notes (Signed)
Patient contacted to schedule Covid screen for 08/14/18 at 1330.

## 2018-08-15 NOTE — Progress Notes (Signed)
SPOKE W/ Uvaldo Rising via phone and reviewed the following questions:      SCREENING SYMPTOMS OF COVID 19:  COUGH--no  RUNNY NOSE--- no  SORE THROAT---no  NASAL CONGESTION----no  SNEEZING----no  SHORTNESS OF BREATH--no  DIFFICULTY BREATHING---no  TEMP >100.0 -----no  UNEXPLAINED BODY ACHES------no  CHILLS -------- no  HEADACHES ---------no  LOSS OF SMELL/ TASTE --------no  HAVE YOU OR ANY FAMILY MEMBER TRAVELLED PAST 14 DAYS OUT OF THE   COUNTY---no STATE----no COUNTRY----no  HAVE YOU OR ANY FAMILY MEMBER BEEN EXPOSED TO ANYONE WITH COVID 19?   no

## 2018-08-15 NOTE — Progress Notes (Signed)
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 Link Snuffer.   Cleared by cardiology 08/08/2018.  Per Daune Perch, NP, "[name] was last seen on 07/18/2018 by Dr. Rayann Heman for pacemaker insertion.  Since that day, [name] has done well. He is very pleased at how much better he feels with the pacemaker. He has no new cardiac complaints.  Per Dr. Rayann Heman, it is OK to proceed with surgery from an EP standpoint. With no prior history of CAD or heart failure and no new symptoms of either, based on ACC/AHA guidelines, the patient would be at acceptable risk for the planned procedure without further cardiovascular testing. Per office protocol, patient can holdEliquisfor 2days prior to procedure."  Pt can proceed with planned procedure barring acute status change.  VS: BP (!) 147/82 (BP Location: Left Arm)   Pulse 88   Temp 36.6 C (Oral)   Resp 18   Ht 5\' 7"  (1.702 m)   Wt 84.1 kg   SpO2 100%   BMI 29.03 kg/m   PROVIDERS: [name], MD is PCP   Thompson Grayer, MD is Cardiologist  LABS: Labs reviewed: Acceptable for surgery. (all labs ordered are listed, but only abnormal results are displayed)  Labs Reviewed  BASIC METABOLIC PANEL - Abnormal; Notable for the following components:      Result Value   Glucose, Bld 126 (*)    All other components within normal limits  CBC     IMAGES:   EKG: 07/19/2018 Rate 78 bpm Normal sinus rhythm Left axis deviation Left anterior fasicular block Low  voltage QRS  CV: Echo 06/26/2018 IMPRESSIONS    1. The left ventricle has normal systolic function, with an ejection fraction of 55-60%. The cavity size was normal. There is mildly increased left ventricular wall thickness. Left ventricular diastolic function could not be evaluated. No evidence of  left ventricular regional wall motion abnormalities.  2. The right ventricle has normal systolc function. The cavity was normal. There is no increase in right ventricular wall thickness.  3. No evidence of mitral valve stenosis. Trivial mitral regurgitation.  4. The aortic valve is tricuspid Mild calcification of the aortic valve. Aortic valve regurgitation is trivial by color flow Doppler. No stenosis of the aortic valve.  5. The aortic root is normal in size and structure.  6. Left atrial size was mild-moderately dilated.  7. The IVC was normal in size. No complete TR doppler jet so unable to estimate PA systolic pressure. Past Medical History:  Diagnosis Date  . Diverticulosis   . Esophageal reflux   . Esophageal stricture   . HA (headache)   . Hearing loss   . Hiatal hernia   . Hypertriglyceridemia   . Hypoglycemia   . Obesity, unspecified   . PAF (paroxysmal atrial fibrillation) (HCC)    Echo with normal LV function and mild LVH  . Syncope    presented with atrial fib converted with Diltiazem    Past Surgical History:  Procedure  Laterality Date  . HERNIA REPAIR    . INSERT / REPLACE / REMOVE PACEMAKER    . LOOP RECORDER INSERTION N/A 04/26/2018   Procedure: LOOP RECORDER INSERTION;  Surgeon: Thompson Grayer, MD;  Location: [place] [facility];  Service: Cardiovascular;  Laterality: N/A;  . LOOP RECORDER REMOVAL N/A 07/18/2018   Procedure: LOOP RECORDER REMOVAL;  Surgeon: Thompson Grayer, MD;  Location: [place] [facility];  Service: Cardiovascular;  Laterality: N/A;  . NASAL SEPTUM SURGERY    . PACEMAKER IMPLANT N/A 07/18/2018   Procedure: PACEMAKER IMPLANT;  Surgeon: Thompson Grayer,  MD;  Location: [place] [facility];  Service: Cardiovascular;  Laterality: N/A;  . TONSILLECTOMY    . TONSILLECTOMY    . UMBILICAL HERNIA REPAIR  1993  . WISDOM TOOTH EXTRACTION     about 78 years old    MEDICATIONS: . atorvastatin (LIPITOR) 20 MG tablet  . Carboxymethylcellulose Sodium (REFRESH LIQUIGEL) 1 % GEL  . Carboxymethylcellulose Sodium (REFRESH TEARS OP)  . ELIQUIS 5 MG TABS tablet  . esomeprazole (NEXIUM) 40 MG capsule  . HYDROcodone-acetaminophen (NORCO/VICODIN) 5-325 MG tablet  . omega-3 acid ethyl esters (LOVAZA) 1 g capsule  . phenylephrine (NEO-SYNEPHRINE) 1 % nasal spray  . solifenacin (VESICARE) 10 MG tablet   No current facility-administered medications for this encounter.     [name] [facility] Pre-Surgical Testing [phone] 08/15/18 10:38 AM

## 2018-08-15 NOTE — Anesthesia Preprocedure Evaluation (Addendum)
Anesthesia Evaluation  Patient identified by MRN, date of birth, ID band Patient awake    Reviewed: Allergy & Precautions, NPO status , Patient's Chart, lab work & pertinent test results  Airway Mallampati: II  TM Distance: >3 FB     Dental   Pulmonary former smoker,    breath sounds clear to auscultation       Cardiovascular hypertension, Normal cardiovascular exam Rhythm:Regular Rate:Normal     Neuro/Psych    GI/Hepatic Neg liver ROS, hiatal hernia, GERD  ,  Endo/Other  negative endocrine ROS  Renal/GU negative Renal ROS     Musculoskeletal   Abdominal   Peds  Hematology   Anesthesia Other Findings   Reproductive/Obstetrics                           Anesthesia Physical Anesthesia Plan  ASA: III  Anesthesia Plan: General   Post-op Pain Management:    Induction: Intravenous  PONV Risk Score and Plan: 3 and Ondansetron and Treatment may vary due to age or medical condition  Airway Management Planned: LMA  Additional Equipment:   Intra-op Plan:   Post-operative Plan: Extubation in OR  Informed Consent: I have reviewed the patients History and Physical, chart, labs and discussed the procedure including the risks, benefits and alternatives for the proposed anesthesia with the patient or authorized representative who has indicated his/her understanding and acceptance.     Dental advisory given  Plan Discussed with: CRNA and Anesthesiologist  Anesthesia Plan Comments: (See PAT note 08/14/2018, Konrad Felix, PA-C)      Anesthesia Quick Evaluation

## 2018-08-16 ENCOUNTER — Ambulatory Visit (HOSPITAL_COMMUNITY): Payer: Medicare Other | Admitting: Physician Assistant

## 2018-08-16 ENCOUNTER — Encounter (HOSPITAL_COMMUNITY): Payer: Self-pay | Admitting: *Deleted

## 2018-08-16 ENCOUNTER — Ambulatory Visit (HOSPITAL_COMMUNITY)
Admission: RE | Admit: 2018-08-16 | Discharge: 2018-08-16 | Disposition: A | Discharge status: Home / Self Care | Payer: Medicare Other | Attending: Urology | Admitting: Urology

## 2018-08-16 ENCOUNTER — Ambulatory Visit (HOSPITAL_COMMUNITY): Payer: Medicare Other | Admitting: Certified Registered"

## 2018-08-16 ENCOUNTER — Encounter (HOSPITAL_COMMUNITY)
Admission: RE | Disposition: A | Discharge status: Home / Self Care | Payer: Self-pay | Source: Home / Self Care | Attending: Urology

## 2018-08-16 ENCOUNTER — Ambulatory Visit (HOSPITAL_COMMUNITY): Payer: Medicare Other

## 2018-08-16 DIAGNOSIS — N2889 Other specified disorders of kidney and ureter: Secondary | ICD-10-CM | POA: Diagnosis not present

## 2018-08-16 DIAGNOSIS — Z79899 Other long term (current) drug therapy: Secondary | ICD-10-CM | POA: Diagnosis not present

## 2018-08-16 DIAGNOSIS — Z7901 Long term (current) use of anticoagulants: Secondary | ICD-10-CM | POA: Insufficient documentation

## 2018-08-16 DIAGNOSIS — I495 Sick sinus syndrome: Secondary | ICD-10-CM | POA: Diagnosis not present

## 2018-08-16 DIAGNOSIS — K219 Gastro-esophageal reflux disease without esophagitis: Secondary | ICD-10-CM | POA: Diagnosis not present

## 2018-08-16 DIAGNOSIS — Z87891 Personal history of nicotine dependence: Secondary | ICD-10-CM | POA: Diagnosis not present

## 2018-08-16 DIAGNOSIS — Z1159 Encounter for screening for other viral diseases: Secondary | ICD-10-CM | POA: Diagnosis not present

## 2018-08-16 DIAGNOSIS — I10 Essential (primary) hypertension: Secondary | ICD-10-CM | POA: Diagnosis not present

## 2018-08-16 DIAGNOSIS — I4891 Unspecified atrial fibrillation: Secondary | ICD-10-CM | POA: Insufficient documentation

## 2018-08-16 DIAGNOSIS — Z950 Presence of cardiac pacemaker: Secondary | ICD-10-CM | POA: Insufficient documentation

## 2018-08-16 HISTORY — PX: CYSTOSCOPY WITH URETEROSCOPY AND STENT PLACEMENT: SHX6377

## 2018-08-16 LAB — NOVEL CORONAVIRUS, NAA (HOSP ORDER, SEND-OUT TO REF LAB; TAT 18-24 HRS): SARS-CoV-2, NAA: NOT DETECTED

## 2018-08-16 SURGERY — CYSTOURETEROSCOPY, WITH STENT INSERTION
Anesthesia: General | Laterality: Left

## 2018-08-16 MED ORDER — PHENYLEPHRINE 40 MCG/ML (10ML) SYRINGE FOR IV PUSH (FOR BLOOD PRESSURE SUPPORT)
PREFILLED_SYRINGE | INTRAVENOUS | Status: DC | PRN
  Administered 2018-08-16: 80 ug via INTRAVENOUS

## 2018-08-16 MED ORDER — ONDANSETRON HCL 4 MG/2ML IJ SOLN
INTRAMUSCULAR | Status: DC | PRN
  Administered 2018-08-16: 4 mg via INTRAVENOUS

## 2018-08-16 MED ORDER — PROPOFOL 10 MG/ML IV BOLUS
INTRAVENOUS | Status: DC | PRN
  Administered 2018-08-16: 50 mg via INTRAVENOUS
  Administered 2018-08-16: 150 mg via INTRAVENOUS

## 2018-08-16 MED ORDER — CEFAZOLIN SODIUM-DEXTROSE 2-4 GM/100ML-% IV SOLN
2.0000 g | INTRAVENOUS | Status: DC
  Filled 2018-08-16: qty 100

## 2018-08-16 MED ORDER — DEXAMETHASONE SODIUM PHOSPHATE 10 MG/ML IJ SOLN
INTRAMUSCULAR | Status: DC | PRN
  Administered 2018-08-16: 8 mg via INTRAVENOUS

## 2018-08-16 MED ORDER — PROPOFOL 10 MG/ML IV BOLUS
INTRAVENOUS | Status: AC
  Filled 2018-08-16: qty 20

## 2018-08-16 MED ORDER — IOHEXOL 300 MG/ML  SOLN
INTRAMUSCULAR | Status: DC | PRN
  Administered 2018-08-16: 17 mL

## 2018-08-16 MED ORDER — DEXAMETHASONE SODIUM PHOSPHATE 10 MG/ML IJ SOLN
INTRAMUSCULAR | Status: AC
  Filled 2018-08-16: qty 1

## 2018-08-16 MED ORDER — SODIUM CHLORIDE 0.9 % IR SOLN
Status: DC | PRN
  Administered 2018-08-16: 3000 mL

## 2018-08-16 MED ORDER — FENTANYL CITRATE (PF) 100 MCG/2ML IJ SOLN
INTRAMUSCULAR | Status: DC | PRN
  Administered 2018-08-16 (×4): 25 ug via INTRAVENOUS

## 2018-08-16 MED ORDER — FENTANYL CITRATE (PF) 100 MCG/2ML IJ SOLN
INTRAMUSCULAR | Status: AC
  Filled 2018-08-16: qty 2

## 2018-08-16 MED ORDER — 0.9 % SODIUM CHLORIDE (POUR BTL) OPTIME
TOPICAL | Status: DC | PRN
  Administered 2018-08-16: 1000 mL

## 2018-08-16 MED ORDER — LACTATED RINGERS IV SOLN
INTRAVENOUS | Status: DC
  Administered 2018-08-16: 10:00:00 via INTRAVENOUS

## 2018-08-16 MED ORDER — LIDOCAINE 2% (20 MG/ML) 5 ML SYRINGE
INTRAMUSCULAR | Status: DC | PRN
  Administered 2018-08-16: 100 mg via INTRAVENOUS

## 2018-08-16 MED ORDER — ONDANSETRON HCL 4 MG/2ML IJ SOLN
INTRAMUSCULAR | Status: AC
  Filled 2018-08-16: qty 2

## 2018-08-16 SURGICAL SUPPLY — 27 items
BAG URO CATCHER STRL LF (MISCELLANEOUS) ×2 IMPLANT
BASKET LASER NITINOL 1.9FR (BASKET) IMPLANT
BASKET ZERO TIP NITINOL 2.4FR (BASKET) IMPLANT
BSKT STON RTRVL 120 1.9FR (BASKET)
BSKT STON RTRVL ZERO TP 2.4FR (BASKET)
CATH INTERMIT  6FR 70CM (CATHETERS) IMPLANT
CATH URET 5FR 28IN CONE TIP (BALLOONS)
CATH URET 5FR 70CM CONE TIP (BALLOONS) IMPLANT
CATH URET DUAL LUMEN 6-10FR 50 (CATHETERS) ×1 IMPLANT
CLOTH BEACON ORANGE TIMEOUT ST (SAFETY) ×1 IMPLANT
COVER WAND RF STERILE (DRAPES) IMPLANT
EXTRACTOR STONE 1.7FRX115CM (UROLOGICAL SUPPLIES) IMPLANT
FIBER LASER FLEXIVA 365 (UROLOGICAL SUPPLIES) IMPLANT
FIBER LASER TRAC TIP (UROLOGICAL SUPPLIES) IMPLANT
GLOVE BIO SURGEON STRL SZ7.5 (GLOVE) ×2 IMPLANT
GOWN STRL REUS W/TWL XL LVL3 (GOWN DISPOSABLE) ×2 IMPLANT
GUIDEWIRE ANG ZIPWIRE 038X150 (WIRE) IMPLANT
GUIDEWIRE STR DUAL SENSOR (WIRE) ×3 IMPLANT
KIT BALLN UROMAX 15FX4 (MISCELLANEOUS) IMPLANT
KIT BALLN UROMAX 26 75X4 (MISCELLANEOUS) ×1
KIT TURNOVER KIT A (KITS) IMPLANT
MANIFOLD NEPTUNE II (INSTRUMENTS) ×2 IMPLANT
PACK CYSTO (CUSTOM PROCEDURE TRAY) ×2 IMPLANT
SHEATH URETERAL 12FRX28CM (UROLOGICAL SUPPLIES) IMPLANT
SHEATH URETERAL 12FRX35CM (MISCELLANEOUS) IMPLANT
STENT CONTOUR 6FRX26X.038 (STENTS) ×1 IMPLANT
TUBING UROLOGY SET (TUBING) ×2 IMPLANT

## 2018-08-16 NOTE — Discharge Instructions (Addendum)

## 2018-08-16 NOTE — Op Note (Signed)
Operative Note  Preoperative diagnosis:  1.  Left renal mass  Postoperative diagnosis: 1.  Left renal mass  Procedure(s): 1.  Cystoscopy, left retrograde pyelogram, left balloon dilation of ureter, left ureteral stent  Surgeon: Link Snuffer, MD  Assistants: None  Anesthesia: General  Complications: None immediate  EBL: Minimal  Specimens: 1.  None  Drains/Catheters: 1.  6 x 26 double-J ureteral stent on the left  Intraoperative findings: 1.  Normal anterior urethra 2.  Borderline obstructing prostate 3.  Normal bladder 4.  No obvious filling defects on left retrograde pyelogram.  However, there was some artifact related to air bubbles.  There was a narrow area in the distal ureter at the ureterovesicular junction and despite balloon dilation, I was unable to access the ureter.  Indication: 78 year old male with a large left renal mass presents for diagnostic ureteroscopy.  Description of procedure:  The patient was identified and consent was obtained.  The patient was taken to the operating room and placed in the supine position.  The patient was placed under general anesthesia.  Perioperative antibiotics were administered.  The patient was placed in dorsal lithotomy.  Patient was prepped and draped in a standard sterile fashion and a timeout was performed.  A 21 French rigid cystoscope was advanced into the urethra and into the bladder.  Complete cystoscopy was performed with the findings noted above.  The left ureter was cannulated with a sensor wire and advanced into the kidney under fluoroscopic guidance.  A dual-lumen ureteral catheter was passed over the wire and I attempted to advance it in the ureter but this met resistance in the distal ureter.  I was able to shoot a retrograde pyelogram with the findings noted above.  I withdrew the ureteral catheter and advanced a balloon dilator for the ureter.  I dilated the distal portion of the ureter for 2 minutes at a time.  I then  withdrew this and again tried to advance the dual-lumen ureteral catheter but again this met resistance.  I again tried to dilate the distal ureter.  After dilation, again tried to access it with a dual-lumen ureteral catheter but again was unsuccessful.  I therefore decided to terminate the procedure and place a ureteral stent.  I backloaded the wire onto a rigid cystoscope and advanced that into the bladder followed by routine placement of a 6 x 26 double-J ureteral stent in the standard fashion followed by removal of the wire.  Fluoroscopy confirmed proximal placement and direct visualization confirmed a good coil within the bladder.  I drained the bladder and withdrew the scope.  Patient tolerated procedure well was stable postoperatively.  Plan: Follow-up in 2 weeks for repeat diagnostic ureteroscopy.

## 2018-08-16 NOTE — Anesthesia Postprocedure Evaluation (Signed)
Anesthesia Post Note  Patient: [name]  Procedure(s) Performed: CYSTOSCOPY WITH LEFT RETROGRADE PYELOGRAM, BALLOON DILATION LEFT URETER, STENT PLACEMENT (Left )     Patient location during evaluation: Phase II Anesthesia Type: General Level of consciousness: awake Pain management: pain level controlled Vital Signs Assessment: post-procedure vital signs reviewed and stable Respiratory status: spontaneous breathing Cardiovascular status: stable Postop Assessment: no apparent nausea or vomiting Anesthetic complications: no    Last Vitals:  Vitals:   08/16/18 1345 08/16/18 1426  BP: (!) 141/93 (!) 142/90  Pulse: 71 70  Resp: 13 16  Temp: 36.6 C (!) 36.4 C  SpO2: 100% 98%    Last Pain:  Vitals:   08/16/18 1426  TempSrc:   PainSc: 0-No pain   Pain Goal:                   Huston Foley

## 2018-08-16 NOTE — Anesthesia Procedure Notes (Addendum)
Procedure Name: LMA Insertion Date/Time: 08/16/2018 12:21 PM Performed by: Anne Fu, CRNA Pre-anesthesia Checklist: Patient identified, Emergency Drugs available, Suction available, Patient being monitored and Timeout performed Patient Re-evaluated:Patient Re-evaluated prior to induction Oxygen Delivery Method: Circle system utilized Preoxygenation: Pre-oxygenation with 100% oxygen Induction Type: IV induction Ventilation: Mask ventilation without difficulty LMA: LMA inserted LMA Size: 4.0 Number of attempts: 1 Placement Confirmation: positive ETCO2 and breath sounds checked- equal and bilateral Tube secured with: Tape Comments: Placed per K. Ambulance person

## 2018-08-16 NOTE — Transfer of Care (Signed)
Immediate Anesthesia Transfer of Care Note  Patient: [name]  Procedure(s) Performed: Procedure(s): CYSTOSCOPY WITH LEFT RETROGRADE PYELOGRAM, BALLOON DILATION LEFT URETER, STENT PLACEMENT (Left)  Patient Location: PACU  Anesthesia Type:General  Level of Consciousness:  sedated, patient cooperative and responds to stimulation  Airway & Oxygen Therapy:Patient Spontanous Breathing and Patient connected to face mask oxgen  Post-op Assessment:  Report given to PACU RN and Post -op Vital signs reviewed and stable  Post vital signs:  Reviewed and stable  Last Vitals:  Vitals:   08/16/18 0924  BP: (!) 141/99  Pulse: 90  Resp: 20  Temp: 36.6 C  SpO2: 61%    Complications: No apparent anesthesia complications

## 2018-08-16 NOTE — H&P (Signed)
CC/HPI: Cc: Left renal mass.  HPI:  07/20/2018  78 year old male with multiple cardiac comorbidities including atrial fibrillation on eliquis. I recently evaluated the patient in the hospital, where he underwent a CT of the chest, abdomen and pelvis to rule out aortic dissection. This found an incidental large left renal mass that was approximately 7.5 cm with an associated mass in the adrenal gland. He was also found to have compression fractures at T1 and L1 and subsequently underwent an MRI of the spine that was notable for likely traumatic rather than pathologic fractures. There was no chest lymphadenopathy on the CTA. His creatinine was 0.9 with a GFR greater than 60.   The patient continues to have significant back pain secondary to his compression fractures. He denies any hematuria or dysuria. Urinalysis is negative today.   The patient's cardiologist is Thompson Grayer, M.D. He placed a pacemaker in. The patient yesterday. Per his note on 07/04/2018, he states that it is okay to proceed with any required urology procedures without further cardiovascular testing.     ALLERGIES: MSG    MEDICATIONS: Nexium 40 mg capsule,delayed release  Vesicare 10 mg tablet  Eliquis 5 mg tablet  Lovaza     GU PSH: None   NON-GU PSH: Hernia Repair Pacemaker placement - 07/19/2018    GU PMH: None   NON-GU PMH: None   FAMILY HISTORY: 1 Daughter - Other Emphysema - Father   SOCIAL HISTORY: Marital Status: Married Preferred Language: English; Race: White Current Smoking Status: Patient does not smoke anymore. Has not smoked since 06/30/1978.   Tobacco Use Assessment Completed: Used Tobacco in last 30 days? Does not drink caffeine.    REVIEW OF SYSTEMS:    GU Review Male:   Patient reports frequent urination, hard to postpone urination, burning/ pain with urination, and get up at night to urinate. Patient denies leakage of urine, stream starts and stops, trouble starting your stream, have to strain to  urinate , erection problems, and penile pain.  Gastrointestinal (Upper):   Patient denies nausea, vomiting, and indigestion/ heartburn.  Gastrointestinal (Lower):   Patient denies diarrhea and constipation.  Constitutional:   Patient denies fever, night sweats, weight loss, and fatigue.  Skin:   Patient reports skin rash/ lesion. Patient denies itching.  Eyes:   Patient denies double vision and blurred vision.  Ears/ Nose/ Throat:   Patient denies sore throat and sinus problems.  Hematologic/Lymphatic:   Patient reports easy bruising. Patient denies swollen glands.  Cardiovascular:   Patient denies leg swelling and chest pains.  Respiratory:   Patient denies cough and shortness of breath.  Endocrine:   Patient reports excessive thirst.   Musculoskeletal:   Patient reports back pain. Patient denies joint pain.  Neurological:   Patient denies headaches and dizziness.  Psychologic:   Patient denies depression and anxiety.   VITAL SIGNS:      07/20/2018 03:46 PM  Weight 185 lb / 83.91 kg  Height 67 in / 170.18 cm  BP 132/90 mmHg  Heart Rate 99 /min  Temperature 97.7 F / 36.5 C  BMI 29.0 kg/m   MULTI-SYSTEM PHYSICAL EXAMINATION:    Constitutional: Well-nourished. No physical deformities. Normally developed. Good grooming.  Respiratory: No labored breathing, no use of accessory muscles.   Cardiovascular: Normal temperature, adequate perfusion of extremities  Skin: No paleness, no jaundice  Neurologic / Psychiatric: Oriented to time, oriented to place, oriented to person. No depression, no anxiety, no agitation.  Gastrointestinal: No mass, no tenderness,  no rigidity, non obese abdomen. Proximal LAD baseball sized reducible supraumbilical hernia at the midline  Eyes: Normal conjunctivae. Normal eyelids.  Musculoskeletal: Normal gait and station of head and neck.     PAST DATA REVIEWED:  Source Of History:  Patient  X-Ray Review: C.T. Abdomen/Pelvis: Reviewed Films. Reviewed Report.  Discussed With Patient.  MRI Lumbar Spine: Reviewed Films. Reviewed Report. Discussed With Patient.     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 Hand-assisted laparoscopic Left radical nephrectomy. He would need to be able to come off eliquis. I specifically discussed the possible complications including but not limited to bleeding, infection, injury to surrounding structures, need for additional procedures. Also discussed the possibility of cardiac complications. I discussed the low but possible risk of death.   He is interested in having his hernia repaired at the same time.   He is currently undecided in regards to his next step. He is thinking about just observation/no treatment given his age of 56 and comorbidities. He will discuss it with his wife and daughter.   If he decides to proceed, I will refer him to general surgery for evaluation of his hernia for possible concurrent repair.   Cc: Scarlette Calico, M.D.  Thompson Grayer, M.D.   Signed by Link Snuffer, III, M.D. on 07/20/18 at 4:48 PM (EDT

## 2018-08-17 ENCOUNTER — Encounter (HOSPITAL_COMMUNITY): Payer: Self-pay | Admitting: Urology

## 2018-08-22 ENCOUNTER — Ambulatory Visit: Payer: Self-pay | Admitting: Surgery

## 2018-08-22 DIAGNOSIS — K432 Incisional hernia without obstruction or gangrene: Secondary | ICD-10-CM | POA: Diagnosis not present

## 2018-08-22 DIAGNOSIS — Z7901 Long term (current) use of anticoagulants: Secondary | ICD-10-CM | POA: Diagnosis not present

## 2018-08-22 DIAGNOSIS — Z950 Presence of cardiac pacemaker: Secondary | ICD-10-CM

## 2018-08-22 HISTORY — DX: Presence of cardiac pacemaker: Z95.0

## 2018-08-22 HISTORY — DX: Incisional hernia without obstruction or gangrene: K43.2

## 2018-08-22 HISTORY — DX: Long term (current) use of anticoagulants: Z79.01

## 2018-08-22 NOTE — H&P (Signed)
Frank [name] Documented: 08/22/2018 9:36 AM Location: [facility] Patient #: [id number] DOB: 10-07-1940 Married / Language: Frank [name] / Race: White Male  History of Present Illness Adin Hector MD; 08/22/2018 2:47 PM) The patient is a 78 year old male who presents with an incisional hernia. Note for "Incisional hernia": ` ` ` Patient sent for surgical consultation at the request of Dr Gloriann Loan, [facility]  Chief Complaint: Incisional hernia. ` ` The patient is a pleasant elderly gentleman with cardiac issues. I believe sick sinus syndrome with bradycardia and intermittent syncopal events improved with pacemaker. Chronically anticoagulated. He had an incidental large left kidney mass noted on a CAT scan. Sent for urological consultation. Undergoing workup that suspicion for renal cell cancer. Given the fact that the patient most likely will need surgical intervention, patient wondered if his incisional hernia should be fixed. He recalls having a hernia repaired at his belly button in the early 53s by Dr. Rosana Hoes who has since retired. Noted some bulging above at the old hernia site. Has not been vertically painful bothersome but its gradually gotten larger in size. His wife notes that he occasionally does have some complaints with it. He has intentionally lost about 10 pounds in the past few years. Wonders if that made it more noticeable. Usually moves his bowels every day. He is to be able walk a mile a day but given recent back issues with some healing fractures, he is a little less active. Occasionally uses a cane for balance. Hard of hearing. Does have a history of paroxysmal atrial fibrillation. He has a loop recorder Medtronic Azure XT MRI conditional dual-chamber pacemaker for symptomatic sinus pauses, sick sinus syndrome, and syncope. He is not diabetic. He does not smoke.  No personal nor family history of GI/colon cancer, inflammatory bowel disease,  irritable bowel syndrome, allergy such as Celiac Sprue, dietary/dairy problems, colitis, ulcers nor gastritis. No recent sick contacts/gastroenteritis. No travel outside the country. No changes in diet. No dysphagia to solids or liquids. No significant heartburn or reflux. No hematochezia, hematemesis, coffee ground emesis. No evidence of prior gastric/peptic ulceration.  (Review of systems as stated in this history (HPI) or in the review of systems. Otherwise all other 12 point ROS are negative) ` ` `   Past Surgical History (Tanisha A. Owens Shark, [place]; 08/22/2018 9:36 AM) Tonsillectomy Vasectomy Ventral / Umbilical Hernia Surgery Left.  Diagnostic Studies History (Tanisha A. Owens Shark, [place]; 08/22/2018 9:36 AM) Colonoscopy 1-5 years ago  Allergies (Tanisha A. Owens Shark, [place]; 08/22/2018 9:37 AM) No Known Drug Allergies [08/22/2018]: Allergies Reconciled  Medication History (Tanisha A. Owens Shark, [place]; 08/22/2018 9:38 AM) Eliquis (5MG  Tablet, Oral) Active. VESIcare (10MG  Tablet, Oral) Active. Omega-3-acid Ethyl Esters (1GM Capsule, Oral) Active. NexIUM (5MG  Packet, Oral) Active. Naproxen (Oral) Specific strength unknown - Active. Medications Reconciled  Social History (Tanisha A. Owens Shark, [place]; 08/22/2018 9:36 AM) No alcohol use No caffeine use No drug use Tobacco use Former smoker.  Family History (Tanisha A. Owens Shark, [place]; 08/22/2018 9:36 AM) Arthritis Mother. Respiratory Condition Father.  Other Problems (Tanisha A. Owens Shark, RMA; 08/22/2018 9:36 AM) Atrial Fibrillation Back Pain Bladder Problems Gastroesophageal Reflux Disease Umbilical Hernia Repair     Review of Systems (Tanisha A. Brown RMA; 08/22/2018 9:36 AM) General Present- Appetite Loss and Weight Loss. Not Present- Chills, Fatigue, Fever, Night Sweats and Weight Gain. Skin Not Present- Change in Wart/Mole, Dryness, Hives, Jaundice, New Lesions, Non-Healing Wounds, Rash and Ulcer. HEENT Present- Hearing Loss  and Wears glasses/contact lenses. Not Present-  Earache, Hoarseness, Nose Bleed, Oral Ulcers, Ringing in the Ears, Seasonal Allergies, Sinus Pain, Sore Throat, Visual Disturbances and Yellow Eyes. Respiratory Not Present- Bloody sputum, Chronic Cough, Difficulty Breathing, Snoring and Wheezing. Breast Not Present- Breast Mass, Breast Pain, Nipple Discharge and Skin Changes. Cardiovascular Not Present- Chest Pain, Difficulty Breathing Lying Down, Leg Cramps, Palpitations, Rapid Heart Rate, Shortness of Breath and Swelling of Extremities. Gastrointestinal Not Present- Abdominal Pain, Bloating, Bloody Stool, Change in Bowel Habits, Chronic diarrhea, Constipation, Difficulty Swallowing, Excessive gas, Gets full quickly at meals, Hemorrhoids, Indigestion, Nausea, Rectal Pain and Vomiting. Male Genitourinary Present- Blood in Urine. Not Present- Change in Urinary Stream, Frequency, Impotence, Nocturia, Painful Urination, Urgency and Urine Leakage. Musculoskeletal Present- Back Pain. Not Present- Joint Pain, Joint Stiffness, Muscle Pain, Muscle Weakness and Swelling of Extremities. Neurological Present- Trouble walking. Not Present- Decreased Memory, Fainting, Headaches, Numbness, Seizures, Tingling, Tremor and Weakness. Psychiatric Not Present- Anxiety, Bipolar, Change in Sleep Pattern, Depression, Fearful and Frequent crying. Endocrine Not Present- Cold Intolerance, Excessive Hunger, Hair Changes, Heat Intolerance, Hot flashes and New Diabetes. Hematology Present- Blood Thinners and Easy Bruising. Not Present- Excessive bleeding, Gland problems, HIV and Persistent Infections.  Vitals (Tanisha A. Brown RMA; 08/22/2018 9:37 AM) 08/22/2018 9:37 AM Weight: 187.4 lb Height: 66in Body Surface Area: 1.95 m Body Mass Index: 30.25 kg/m  Temp.: 98.96F  Pulse: 68 (Regular)  BP: 128/86 (Sitting, Left Arm, Standard)        Physical Exam Adin Hector MD; 08/22/2018 2:47 PM)  General Mental  Status-Alert. General Appearance-Not in acute distress, Not Sickly. Orientation-Oriented X3. Hydration-Well hydrated. Voice-Normal.  Integumentary Global Assessment Upon inspection and palpation of skin surfaces of the - Axillae: non-tender, no inflammation or ulceration, no drainage. and Distribution of scalp and body hair is normal. General Characteristics Temperature - normal warmth is noted.  Head and Neck Head-normocephalic, atraumatic with no lesions or palpable masses. Face Global Assessment - atraumatic, no absence of expression. Neck Global Assessment - no abnormal movements, no bruit auscultated on the right, no bruit auscultated on the left, no decreased range of motion, non-tender. Trachea-midline. Thyroid Gland Characteristics - non-tender.  Eye Eyeball - Left-Extraocular movements intact, No Nystagmus - Left. Eyeball - Right-Extraocular movements intact, No Nystagmus - Right. Cornea - Left-No Hazy - Left. Cornea - Right-No Hazy - Right. Sclera/Conjunctiva - Left-No scleral icterus, No Discharge - Left. Sclera/Conjunctiva - Right-No scleral icterus, No Discharge - Right. Pupil - Left-Direct reaction to light normal. Pupil - Right-Direct reaction to light normal.  ENMT Ears Pinna - Left - no drainage observed, no generalized tenderness observed. Pinna - Right - no drainage observed, no generalized tenderness observed. Nose and Sinuses External Inspection of the Nose - no destructive lesion observed. Inspection of the nares - Left - quiet respiration. Inspection of the nares - Right - quiet respiration. Mouth and Throat Lips - Upper Lip - no fissures observed, no pallor noted. Lower Lip - no fissures observed, no pallor noted. Nasopharynx - no discharge present. Oral Cavity/Oropharynx - Tongue - no dryness observed. Oral Mucosa - no cyanosis observed. Hypopharynx - no evidence of airway distress observed. Note: Hard of hearing  Chest  and Lung Exam Inspection Movements - Normal and Symmetrical. Accessory muscles - No use of accessory muscles in breathing. Palpation Palpation of the chest reveals - Non-tender. Auscultation Breath sounds - Normal and Clear.  Cardiovascular Auscultation Rhythm - Regular. Murmurs & Other Heart Sounds - Auscultation of the heart reveals - No Murmurs and No Systolic Clicks.  Note: Subcutaneous mass in left upper chest consistent with pacemaker  Abdomen Inspection Inspection of the abdomen reveals - No Visible peristalsis and No Abnormal pulsations. Umbilicus - No Bleeding, No Urine drainage. Palpation/Percussion Palpation and Percussion of the abdomen reveal - Soft, Non Tender, No Rebound tenderness, No Rigidity (guarding) and No Cutaneous hyperesthesia. Note: Abdomen soft. Periumbilical bulging 6 x 5 cm reduces down to smaller supraumbilical defect. Consistent with supraumbilical hernia near prior incision.   Nontender. Not distended. No umbilical or incisional hernias. No guarding.  Male Genitourinary Sexual Maturity Tanner 5 - Adult hair pattern and Adult penile size and shape. Note: No inguinal hernias. Normal external genitalia. Epididymi, testes, and spermatic cords normal without any masses.  Peripheral Vascular Upper Extremity Inspection - Left - No Cyanotic nailbeds - Left, Not Ischemic. Inspection - Right - No Cyanotic nailbeds - Right, Not Ischemic.  Neurologic Neurologic evaluation reveals -normal attention span and ability to concentrate, able to name objects and repeat phrases. Appropriate fund of knowledge , normal sensation and normal coordination. Mental Status Affect - not angry, not paranoid. Cranial Nerves-Normal Bilaterally. Gait-Normal.  Neuropsychiatric Mental status exam performed with findings of-able to articulate well with normal speech/language, rate, volume and coherence, thought content normal with ability to perform basic computations  and apply abstract reasoning and no evidence of hallucinations, delusions, obsessions or homicidal/suicidal ideation.  Musculoskeletal Global Assessment Spine, Ribs and Pelvis - no instability, subluxation or laxity. Right Upper Extremity - no instability, subluxation or laxity.  Lymphatic Head & Neck  General Head & Neck Lymphatics: Bilateral - Description - No Localized lymphadenopathy. Axillary  General Axillary Region: Bilateral - Description - No Localized lymphadenopathy. Femoral & Inguinal  Generalized Femoral & Inguinal Lymphatics: Left - Description - No Localized lymphadenopathy. Right - Description - No Localized lymphadenopathy.    Assessment & Plan Adin Hector MD; 08/22/2018 1:17 PM)  INCISIONAL HERNIA, WITHOUT OBSTRUCTION OR GANGRENE (K43.2) Impression: Supraumbilical midline mass reducible consistent with incisional hernia near prior umbilical hernia repair.  Think that he would benefit from repair. We did underlay repair with mesh.  It is reasonable to coordinate this at the time of any planned kidney surgery. Patient has a large left kidney mass that is suspicious for tumor beginning worked up. Reasonable to plan hernia repair at the time of the nephrectomy. Could extract the kidney through the hernia and then repaired with underlay mesh since I would think infection risk is not particularly high with a nephrectomy only. Most likely lap assisted. We'll get the feedback from his urologist, Dr. Gloriann Loan.   PREOP - [place] - ENCOUNTER FOR PREOPERATIVE EXAMINATION FOR GENERAL SURGICAL PROCEDURE (Z01.818)  Current Plans You are being scheduled for surgery- Our schedulers will call you.  You should hear from our office's scheduling department within 5 working days about the location, date, and time of surgery. We try to make accommodations for patient's preferences in scheduling surgery, but sometimes the OR schedule or the surgeon's schedule prevents [place] from making those  accommodations.  If you have not heard from our office [phone]) in 5 working days, call the office and ask for your surgeon's nurse.  If you have other questions about your diagnosis, plan, or surgery, call the office and ask for your surgeon's nurse.  Written instructions provided The anatomy & physiology of the abdominal wall was discussed. The pathophysiology of hernias was discussed. Natural history risks without surgery including progeressive enlargement, pain, incarceration, & strangulation was discussed. Contributors to complications such as smoking,  obesity, diabetes, prior surgery, etc were discussed.  I feel the risks of no intervention will lead to serious problems that outweigh the operative risks; therefore, I recommended surgery to reduce and repair the hernia. I explained laparoscopic techniques with possible need for an open approach. I noted the probable use of mesh to patch and/or buttress the hernia repair  Risks such as bleeding, infection, abscess, need for further treatment, heart attack, death, and other risks were discussed. I noted a good likelihood this will help address the problem. Goals of post-operative recovery were discussed as well. Possibility that this will not correct all symptoms was explained. I stressed the importance of low-impact activity, aggressive pain control, avoiding constipation, & not pushing through pain to minimize risk of post-operative chronic pain or injury. Possibility of reherniation especially with smoking, obesity, diabetes, immunosuppression, and other health conditions was discussed. We will work to minimize complications.  An educational handout further explaining the pathology & treatment options was given as well. Questions were answered. The patient expresses understanding & wishes to proceed with surgery.  Pt Education - [facility] Hernia Post-Op HCI (Matei Magnone): discussed with patient and provided information. Pt Education -  [facility] Pain Control (Lastacia Solum) Pt Education - Pamphlet Given - Laparoscopic Hernia Repair: discussed with patient and provided information. Pt Education - [facility] Mesh education: discussed with patient and provided information.  ANTICOAGULATED (Z79.01) Impression: He is anticoagulated on Eliquis for dysrhythmias. He has a pacemaker and is more stable. He is followed by Dr. Rayann Heman with Cardiology. We would want cardiac clearance before proceeding. He has been following the patient is aware of the workup.  Current Plans I recommended obtaining preoperative cardiac clearance. I am concerned about the health of the patient and the ability to tolerate the operation. Therefore, we will request clearance by cardiology to better assess operative risk & see if a reevaluation, further workup, etc is needed. Also recommendations on how medications such as for anticoagulation and blood pressure should be managed/held/restarted after surgery. Pt Education - CCS Hold anticoagulation preoperatively  Adin Hector, MD, FACS, MASCRS Gastrointestinal and Minimally Invasive Surgery    [address], [place], [place] [address] [phone] Main / Paging [phone] Fax

## 2018-08-23 DIAGNOSIS — D3500 Benign neoplasm of unspecified adrenal gland: Secondary | ICD-10-CM | POA: Diagnosis not present

## 2018-08-23 DIAGNOSIS — R311 Benign essential microscopic hematuria: Secondary | ICD-10-CM | POA: Diagnosis not present

## 2018-08-23 DIAGNOSIS — D4102 Neoplasm of uncertain behavior of left kidney: Secondary | ICD-10-CM | POA: Diagnosis not present

## 2018-08-25 ENCOUNTER — Telehealth: Payer: Self-pay | Admitting: *Deleted

## 2018-08-25 NOTE — Telephone Encounter (Signed)
Follow Up:   Alisha from Dr Johney Maine office called. She wants to check on the status of the clearance that she faxed over on 08-23-18.Frank [name] Please fax this back to [phone] asap please.

## 2018-08-25 NOTE — Telephone Encounter (Signed)
Patient with diagnosis of afib on Eliquis for anticoagulation.    Procedure:  VENTRAL HERNIA REPAIR w/MESH Date of procedure: TBD  CHADS2-VASc score of3(CHF, HTN, AGE, DM2, stroke/tia x 2, CAD, AGE, male)  CrCl 79 ml/min  Per office protocol, patient can hold Eliquis for 2 days prior to procedure.

## 2018-08-25 NOTE — Telephone Encounter (Signed)
   [facility] Pre-operative Risk Assessment    Request for surgical clearance:  1. What type of surgery is being performed? VENTRAL HERNIA REPAIR w/MESH  2. When is this surgery scheduled? TBD   3. What type of clearance is required (medical clearance vs. Pharmacy clearance to hold med vs. Both)? BOTH   4. Are there any medications that need to be held prior to surgery and how long? ELIQUIS  5. Practice name and name of physician performing surgery? [facility]; DR. Johney Maine IN COORDINATION WITH DR. BELL FROM [facility]  6. What is your office phone number [phone]   7.   What is your office fax number [phone]  8.   Anesthesia type (None, local, MAC, general) ? GENERAL   Julaine Hua 08/25/2018, 1:20 PM  _________________________________________________________________   (provider comments below)

## 2018-08-25 NOTE — Telephone Encounter (Signed)
Left message for the patient to call back and speak to the on call preop APP.   I spoke with our device clinic, they will await for the surgeon's office to sent them a peri-operative device management form.

## 2018-08-28 NOTE — Telephone Encounter (Signed)
Patient is returning call.  °

## 2018-08-28 NOTE — Telephone Encounter (Signed)
   Primary Cardiologist: Mertie Moores, MD  Chart reviewed as part of pre-operative protocol coverage. Patient was contacted 08/28/2018 in reference to pre-operative risk assessment for pending surgery as outlined below.  [name] was last seen on 07/18/18 by Dr. Rayann Heman for pacemaker insertion.  Since that day, [name] has done well. No further episodes of syncope. He does not have a history of CAD and can complete more than 4.0 METS without anginal symptoms. He is limited by back pain.   Per our pharmacy staff: Patient with diagnosis of afib on Eliquis for anticoagulation.    Procedure:  VENTRAL HERNIA REPAIR w/MESH Date of procedure: TBD  CHADS2-VASc score of3(CHF, HTN, AGE, DM2, stroke/tia x 2, CAD, AGE, male)  CrCl 79 ml/min  Per office protocol, patient can hold Eliquis for 2 days prior to procedure.     Therefore, based on ACC/AHA guidelines, the patient would be at acceptable risk for the planned procedure without further cardiovascular testing.   I will route this recommendation to the requesting party via Epic fax function and remove from pre-op pool.  Please call with questions.  Tami Lin Duke, PA 08/28/2018, 10:20 AM

## 2018-08-28 NOTE — Telephone Encounter (Signed)
Attempted to call both numbers, was able to leave VM on mobile number.

## 2018-08-29 ENCOUNTER — Other Ambulatory Visit: Payer: Self-pay | Admitting: Internal Medicine

## 2018-08-29 DIAGNOSIS — E781 Pure hyperglyceridemia: Secondary | ICD-10-CM

## 2018-09-07 ENCOUNTER — Other Ambulatory Visit: Payer: Self-pay | Admitting: Urology

## 2018-09-27 DIAGNOSIS — L820 Inflamed seborrheic keratosis: Secondary | ICD-10-CM | POA: Diagnosis not present

## 2018-10-10 NOTE — Patient Instructions (Signed)
YOU NEED TO HAVE A COVID 19 TEST ON__8-15-20_____ @_______ , THIS TEST MUST BE DONE BEFORE SURGERY, COME  [name] , [id number]. ONCE YOUR COVID TEST IS COMPLETED, PLEASE BEGIN THE QUARANTINE INSTRUCTIONS AS OUTLINED IN YOUR HANDOUT.                [name]  10/10/2018   Your procedure is scheduled on: 10-18-18   Report to [facility] Main  Entrance              Report to admitting at         0630 AM   [name].    Call this number if you have problems the morning of surgery [phone]    Remember: Do not eat food or drink liquids :After Midnight. BRUSH YOUR TEETH MORNING OF SURGERY AND RINSE YOUR MOUTH OUT, NO CHEWING GUM CANDY OR MINTS.     Take these medicines the morning of surgery with A SIP OF WATER: vesicare, nexium                                You may not have any metal on your body including hair pins and              piercings  Do not wear jewelry, , lotions, powders or perfumes, deodorant                       Men may shave face and neck.   Do not bring valuables to the hospital. [name].  Contacts, dentures or bridgework may not be worn into surgery.                 Please read over the following fact sheets you were given: _____________________________________________________________________           [facility] - Preparing for Surgery Before surgery, you can play an important role.  Because skin is not sterile, your skin needs to be as free of germs as possible.  You can reduce the number of germs on your skin by washing with CHG (chlorahexidine gluconate) soap before surgery.  CHG is an antiseptic cleaner which kills germs and bonds with the skin to continue killing germs even after washing. Please DO NOT use if you have an allergy to CHG or antibacterial soaps.  If your skin becomes reddened/irritated  stop using the CHG and inform your nurse when you arrive at Short Stay. Do not shave (including legs and underarms) for at least 48 hours prior to the first CHG shower.  You may shave your face/neck. Please follow these instructions carefully:  1.  Shower with CHG Soap the night before surgery and the  morning of Surgery.  2.  If you choose to wash your hair, wash your hair first as usual with your  normal  shampoo.  3.  After you shampoo, rinse your hair and body thoroughly to remove the  shampoo.                           4.  Use CHG as you would any other liquid soap.  You can apply chg directly  to the skin and wash                       Gently with a scrungie or clean washcloth.  5.  Apply the CHG Soap to your body ONLY FROM THE NECK DOWN.   Do not use on face/ open                           Wound or open sores. Avoid contact with eyes, ears mouth and genitals (private parts).                       Wash face,  Genitals (private parts) with your normal soap.             6.  Wash thoroughly, paying special attention to the area where your surgery  will be performed.  7.  Thoroughly rinse your body with warm water from the neck down.  8.  DO NOT shower/wash with your normal soap after using and rinsing off  the CHG Soap.                9.  Pat yourself dry with a clean towel.            10.  Wear clean pajamas.            11.  Place clean sheets on your bed the night of your first shower and do not  sleep with pets. Day of Surgery : Do not apply any lotions/deodorants the morning of surgery.  Please wear clean clothes to the hospital/surgery center.  FAILURE TO FOLLOW THESE INSTRUCTIONS MAY RESULT IN THE CANCELLATION OF YOUR SURGERY PATIENT SIGNATURE_________________________________  NURSE SIGNATURE__________________________________  ________________________________________________________________________ 10-14-18

## 2018-10-10 NOTE — Progress Notes (Signed)
EKG 07-19-18 Epic  ECHO 06-26-18  CXR 07-19-18

## 2018-10-11 ENCOUNTER — Encounter (HOSPITAL_COMMUNITY)
Admission: RE | Admit: 2018-10-11 | Discharge: 2018-10-11 | Disposition: A | Discharge status: Home / Self Care | Payer: Medicare Other | Source: Ambulatory Visit | Attending: Urology | Admitting: Urology

## 2018-10-11 ENCOUNTER — Encounter (HOSPITAL_COMMUNITY): Payer: Self-pay

## 2018-10-11 ENCOUNTER — Other Ambulatory Visit: Payer: Self-pay

## 2018-10-11 DIAGNOSIS — K439 Ventral hernia without obstruction or gangrene: Secondary | ICD-10-CM | POA: Diagnosis not present

## 2018-10-11 DIAGNOSIS — Z01812 Encounter for preprocedural laboratory examination: Secondary | ICD-10-CM | POA: Diagnosis not present

## 2018-10-11 DIAGNOSIS — Z20828 Contact with and (suspected) exposure to other viral communicable diseases: Secondary | ICD-10-CM | POA: Insufficient documentation

## 2018-10-11 DIAGNOSIS — N2889 Other specified disorders of kidney and ureter: Secondary | ICD-10-CM | POA: Insufficient documentation

## 2018-10-11 HISTORY — DX: Presence of cardiac pacemaker: Z95.0

## 2018-10-11 HISTORY — DX: Other specified disorders of kidney and ureter: N28.89

## 2018-10-11 LAB — CBC
HCT: 43.4 % (ref 39.0–52.0)
Hemoglobin: 14.4 g/dL (ref 13.0–17.0)
MCH: 31.3 pg (ref 26.0–34.0)
MCHC: 33.2 g/dL (ref 30.0–36.0)
MCV: 94.3 fL (ref 80.0–100.0)
Platelets: 175 10*3/uL (ref 150–400)
RBC: 4.6 MIL/uL (ref 4.22–5.81)
RDW: 13.9 % (ref 11.5–15.5)
WBC: 6.6 10*3/uL (ref 4.0–10.5)
nRBC: 0 % (ref 0.0–0.2)

## 2018-10-11 LAB — BASIC METABOLIC PANEL
Anion gap: 6 (ref 5–15)
BUN: 13 mg/dL (ref 8–23)
CO2: 26 mmol/L (ref 22–32)
Calcium: 9.1 mg/dL (ref 8.9–10.3)
Chloride: 107 mmol/L (ref 98–111)
Creatinine, Ser: 0.83 mg/dL (ref 0.61–1.24)
GFR calc Af Amer: >60 mL/min (ref >60)
GFR calc non Af Amer: >60 mL/min (ref >60)
Glucose, Bld: 74 mg/dL (ref 70–99)
Potassium: 3.9 mmol/L (ref 3.5–5.1)
Sodium: 139 mmol/L (ref 135–145)

## 2018-10-11 LAB — PROTIME-INR
INR: 1.2 (ref 0.8–1.2)
Prothrombin Time: 14.6 seconds (ref 11.4–15.2)

## 2018-10-12 NOTE — Anesthesia Preprocedure Evaluation (Addendum)
Anesthesia Evaluation  Patient identified by MRN, date of birth, ID band Patient awake    Reviewed: Allergy & Precautions, NPO status , Patient's Chart, lab work & pertinent test results  History of Anesthesia Complications Negative for: history of anesthetic complications  Airway Mallampati: II  TM Distance: >3 FB Neck ROM: Full    Dental  (+) Dental Advisory Given   Pulmonary former smoker,    Pulmonary exam normal        Cardiovascular hypertension, Normal cardiovascular exam+ dysrhythmias Atrial Fibrillation + pacemaker    '20 TTE - EF 55-60%. Mildly increased left ventricular wall thickness. Trivial MR. Trivial AI. Mild-moderately dilated LA.     Neuro/Psych  Headaches, negative psych ROS   GI/Hepatic Neg liver ROS, hiatal hernia, GERD  Medicated, Esophageal stricture    Endo/Other  negative endocrine ROS  Renal/GU negative Renal ROS     Musculoskeletal negative musculoskeletal ROS (+)   Abdominal   Peds  Hematology negative hematology ROS (+)   Anesthesia Other Findings   Reproductive/Obstetrics                           Anesthesia Physical Anesthesia Plan  ASA: III  Anesthesia Plan: General   Post-op Pain Management:    Induction: Intravenous  PONV Risk Score and Plan: 2 and Treatment may vary due to age or medical condition, Ondansetron and Dexamethasone  Airway Management Planned: Oral ETT  Additional Equipment: Arterial line  Intra-op Plan:   Post-operative Plan: Extubation in OR  Informed Consent: I have reviewed the patients History and Physical, chart, labs and discussed the procedure including the risks, benefits and alternatives for the proposed anesthesia with the patient or authorized representative who has indicated his/her understanding and acceptance.     Dental advisory given  Plan Discussed with: CRNA and Anesthesiologist  Anesthesia Plan  Comments:       Anesthesia Quick Evaluation

## 2018-10-12 NOTE — Progress Notes (Signed)
Anesthesia Chart Review   Case: [id number] Date/Time: 10/18/18 0815   Procedures:      LEFT HAND ASSISTED LAPAROSCOPIC RADICAL NEPHRECTOMY WITH ADRENALECTOMY (Left )     LAPAROSCOPIC ASSISTED VENTRAL WALL HERNIA REPAIR WITH MESH (N/A )   Anesthesia type: General   Pre-op diagnosis: LEFT REANL MASS, VENTRAL HERNIA   Location: [facility] / [facility]   Surgeon: Lucas Mallow, MD; Michael Boston, MD      DISCUSSION:78 y.o. former smoker (quit 03/02/87) with h/o PAF, packemaker in place, left renal mass, ventral hernia scheduled for above procedure 10/18/2018 with Dr. Link Snuffer and Dr. Michael Boston.   Pt s/p cystoscopy 08/16/2018.  No anesthesia complications noted.  Prior to this procedure he was cleared by cardiology.  No changes since this procedure.  Pt stable at PAT.    Cleared by cardiology 08/08/2018.  Per Daune Perch, NP, "Frank [name].was last seen on 5/19/2020by Dr. Rayann Heman for pacemaker insertion. Since that day, Frank [name].has done well.He is very pleased at how much better he feels with the pacemaker. He has no new cardiac complaints.  Per Dr. Rayann Heman, it is OK to proceed with surgery from an EP standpoint. With no prior history of CAD or heart failure and no new symptoms of either, based on ACC/AHA guidelines, the patient would be at acceptable risk for the planned procedure without further cardiovascular testing. Per office protocol, patient can holdEliquisfor 2days prior to procedure."  Anticipate pt can proceed with planned procedure barring acute status change.   VS: BP (!) 129/96   Pulse 61   Temp 36.4 C (Oral)   Resp 16   Ht 5\' 7"  (1.702 m)   Wt 84.4 kg   SpO2 98%   BMI 29.13 kg/m   PROVIDERS: [name], MD is PCP   Thompson Grayer, MD is Cardiologist  LABS: Labs reviewed: Acceptable for surgery. (all labs ordered are listed, but only abnormal results are displayed)  Labs Reviewed  BASIC METABOLIC PANEL  CBC  PROTIME-INR      IMAGES: VAS US Carotid 04/25/2018 Summary: Right Carotid: There is no evidence of stenosis in the right ICA.  Left Carotid: Velocities in the left ICA are consistent with a 1-39% stenosis.  Vertebrals:  Bilateral vertebral arteries demonstrate antegrade flow. Subclavians: Normal flow hemodynamics were seen in bilateral subclavian              arteries.  EKG: 07/19/2018 Rate 78 bpm Normal sinus rhythm  Left axis deviation  Left anterior fascicular block   CV: Echo 06/26/2018 IMPRESSIONS    1. The left ventricle has normal systolic function, with an ejection fraction of 55-60%. The cavity size was normal. There is mildly increased left ventricular wall thickness. Left ventricular diastolic function could not be evaluated. No evidence of  left ventricular regional wall motion abnormalities.  2. The right ventricle has normal systolc function. The cavity was normal. There is no increase in right ventricular wall thickness.  3. No evidence of mitral valve stenosis. Trivial mitral regurgitation.  4. The aortic valve is tricuspid Mild calcification of the aortic valve. Aortic valve regurgitation is trivial by color flow Doppler. No stenosis of the aortic valve.  5. The aortic root is normal in size and structure.  6. Left atrial size was mild-moderately dilated.  7. The IVC was normal in size. No complete TR doppler jet so unable to estimate PA systolic pressure. Past Medical History:  Diagnosis Date  .  Diverticulosis   . Esophageal reflux   . Esophageal stricture   . HA (headache)   . Hearing loss   . Hiatal hernia   . Hypertriglyceridemia   . Hypoglycemia   . Obesity, unspecified   . PAF (paroxysmal atrial fibrillation) (HCC)    Echo with normal LV function and mild LVH  . Presence of permanent cardiac pacemaker   . Renal mass    left  . Syncope    presented with atrial fib converted with Diltiazem    Past Surgical History:  Procedure Laterality Date  .  CYSTOSCOPY WITH URETEROSCOPY AND STENT PLACEMENT Left 08/16/2018   Procedure: CYSTOSCOPY WITH LEFT RETROGRADE PYELOGRAM, BALLOON DILATION LEFT URETER, STENT PLACEMENT;  Surgeon: Lucas Mallow, MD;  Location: [facility];  Service: Urology;  Laterality: Left;  . HERNIA REPAIR    . INSERT / REPLACE / REMOVE PACEMAKER    . LOOP RECORDER INSERTION N/A 04/26/2018   Procedure: LOOP RECORDER INSERTION;  Surgeon: Thompson Grayer, MD;  Location: [facility];  Service: Cardiovascular;  Laterality: N/A;  . LOOP RECORDER REMOVAL N/A 07/18/2018   Procedure: LOOP RECORDER REMOVAL;  Surgeon: Thompson Grayer, MD;  Location: [facility];  Service: Cardiovascular;  Laterality: N/A;  . NASAL SEPTUM SURGERY    . PACEMAKER IMPLANT N/A 07/18/2018   Procedure: PACEMAKER IMPLANT;  Surgeon: Thompson Grayer, MD;  Location: [facility];  Service: Cardiovascular;  Laterality: N/A;  . TONSILLECTOMY    . TONSILLECTOMY    . UMBILICAL HERNIA REPAIR  1993  . WISDOM TOOTH EXTRACTION     about 78 years old    MEDICATIONS: . atorvastatin (LIPITOR) 20 MG tablet  . Carboxymethylcellulose Sodium (REFRESH LIQUIGEL) 1 % GEL  . Carboxymethylcellulose Sodium (REFRESH TEARS OP)  . ELIQUIS 5 MG TABS tablet  . esomeprazole (NEXIUM) 40 MG capsule  . naproxen sodium (ALEVE) 220 MG tablet  . omega-3 acid ethyl esters (LOVAZA) 1 g capsule  . OVER THE COUNTER MEDICATION  . phenylephrine (4-WAY FAST ACTING) 1 % nasal spray  . solifenacin (VESICARE) 10 MG tablet   No current facility-administered medications for this encounter.     [name] [facility]Surgical Testing [phone] 10/12/18  1:25 PM

## 2018-10-13 NOTE — Progress Notes (Signed)
Dannial [facility] medtronic rep returned call will come for procedure 10-18-18 aware surgery starts at 830 am

## 2018-10-14 ENCOUNTER — Other Ambulatory Visit (HOSPITAL_COMMUNITY)
Admission: RE | Admit: 2018-10-14 | Discharge: 2018-10-14 | Disposition: A | Discharge status: Home / Self Care | Payer: Medicare Other | Source: Ambulatory Visit | Attending: Urology | Admitting: Urology

## 2018-10-14 DIAGNOSIS — K439 Ventral hernia without obstruction or gangrene: Secondary | ICD-10-CM | POA: Diagnosis not present

## 2018-10-14 DIAGNOSIS — N2889 Other specified disorders of kidney and ureter: Secondary | ICD-10-CM | POA: Diagnosis not present

## 2018-10-14 DIAGNOSIS — Z01812 Encounter for preprocedural laboratory examination: Secondary | ICD-10-CM | POA: Diagnosis not present

## 2018-10-14 LAB — SARS CORONAVIRUS 2 (TAT 6-24 HRS): SARS Coronavirus 2: NEGATIVE

## 2018-10-17 MED ORDER — BUPIVACAINE LIPOSOME 1.3 % IJ SUSP
20.0000 mL | Freq: Once | INTRAMUSCULAR | Status: DC
  Filled 2018-10-17: qty 20

## 2018-10-18 ENCOUNTER — Encounter (HOSPITAL_COMMUNITY): Payer: Self-pay | Admitting: Anesthesiology

## 2018-10-18 ENCOUNTER — Inpatient Hospital Stay (HOSPITAL_COMMUNITY): Payer: Medicare Other | Admitting: Physician Assistant

## 2018-10-18 ENCOUNTER — Encounter: Payer: Medicare Other | Admitting: Internal Medicine

## 2018-10-18 ENCOUNTER — Ambulatory Visit (INDEPENDENT_AMBULATORY_CARE_PROVIDER_SITE_OTHER): Payer: Medicare Other | Admitting: *Deleted

## 2018-10-18 ENCOUNTER — Inpatient Hospital Stay (HOSPITAL_COMMUNITY)
Admission: RE | Admit: 2018-10-18 | Discharge: 2018-10-22 | DRG: 657 | Disposition: A | Discharge status: Home / Self Care | Payer: Medicare Other | Attending: Urology | Admitting: Urology

## 2018-10-18 ENCOUNTER — Other Ambulatory Visit: Payer: Self-pay

## 2018-10-18 ENCOUNTER — Inpatient Hospital Stay (HOSPITAL_COMMUNITY): Payer: Medicare Other | Admitting: Anesthesiology

## 2018-10-18 ENCOUNTER — Encounter (HOSPITAL_COMMUNITY)
Admission: RE | Disposition: A | Discharge status: Home / Self Care | Payer: Self-pay | Source: Home / Self Care | Attending: Urology

## 2018-10-18 DIAGNOSIS — K219 Gastro-esophageal reflux disease without esophagitis: Secondary | ICD-10-CM | POA: Diagnosis not present

## 2018-10-18 DIAGNOSIS — Z7901 Long term (current) use of anticoagulants: Secondary | ICD-10-CM | POA: Diagnosis not present

## 2018-10-18 DIAGNOSIS — Z8261 Family history of arthritis: Secondary | ICD-10-CM | POA: Diagnosis not present

## 2018-10-18 DIAGNOSIS — I10 Essential (primary) hypertension: Secondary | ICD-10-CM | POA: Diagnosis present

## 2018-10-18 DIAGNOSIS — E278 Other specified disorders of adrenal gland: Secondary | ICD-10-CM | POA: Diagnosis not present

## 2018-10-18 DIAGNOSIS — I495 Sick sinus syndrome: Secondary | ICD-10-CM | POA: Diagnosis present

## 2018-10-18 DIAGNOSIS — S36209A Unspecified injury of unspecified part of pancreas, initial encounter: Secondary | ICD-10-CM | POA: Diagnosis not present

## 2018-10-18 DIAGNOSIS — C642 Malignant neoplasm of left kidney, except renal pelvis: Principal | ICD-10-CM | POA: Diagnosis present

## 2018-10-18 DIAGNOSIS — Z885 Allergy status to narcotic agent status: Secondary | ICD-10-CM

## 2018-10-18 DIAGNOSIS — Z886 Allergy status to analgesic agent status: Secondary | ICD-10-CM

## 2018-10-18 DIAGNOSIS — Y838 Other surgical procedures as the cause of abnormal reaction of the patient, or of later complication, without mention of misadventure at the time of the procedure: Secondary | ICD-10-CM | POA: Diagnosis not present

## 2018-10-18 DIAGNOSIS — H9190 Unspecified hearing loss, unspecified ear: Secondary | ICD-10-CM | POA: Diagnosis present

## 2018-10-18 DIAGNOSIS — Z87891 Personal history of nicotine dependence: Secondary | ICD-10-CM

## 2018-10-18 DIAGNOSIS — I480 Paroxysmal atrial fibrillation: Secondary | ICD-10-CM | POA: Diagnosis not present

## 2018-10-18 DIAGNOSIS — K432 Incisional hernia without obstruction or gangrene: Secondary | ICD-10-CM | POA: Diagnosis present

## 2018-10-18 DIAGNOSIS — Z888 Allergy status to other drugs, medicaments and biological substances status: Secondary | ICD-10-CM | POA: Diagnosis not present

## 2018-10-18 DIAGNOSIS — Z950 Presence of cardiac pacemaker: Secondary | ICD-10-CM | POA: Diagnosis not present

## 2018-10-18 DIAGNOSIS — N400 Enlarged prostate without lower urinary tract symptoms: Secondary | ICD-10-CM | POA: Diagnosis present

## 2018-10-18 DIAGNOSIS — N2889 Other specified disorders of kidney and ureter: Secondary | ICD-10-CM | POA: Diagnosis not present

## 2018-10-18 DIAGNOSIS — Z87892 Personal history of anaphylaxis: Secondary | ICD-10-CM | POA: Diagnosis present

## 2018-10-18 DIAGNOSIS — C7972 Secondary malignant neoplasm of left adrenal gland: Secondary | ICD-10-CM | POA: Diagnosis not present

## 2018-10-18 DIAGNOSIS — K8689 Other specified diseases of pancreas: Secondary | ICD-10-CM | POA: Diagnosis not present

## 2018-10-18 DIAGNOSIS — E785 Hyperlipidemia, unspecified: Secondary | ICD-10-CM | POA: Diagnosis not present

## 2018-10-18 HISTORY — PX: LAPAROSCOPIC ASSISTED VENTRAL HERNIA REPAIR: SHX6312

## 2018-10-18 HISTORY — PX: LAPAROSCOPIC NEPHRECTOMY, HAND ASSISTED: SHX1929

## 2018-10-18 LAB — CBC
HCT: 37.3 % — ABNORMAL LOW (ref 39.0–52.0)
Hemoglobin: 12.3 g/dL — ABNORMAL LOW (ref 13.0–17.0)
MCH: 31.2 pg (ref 26.0–34.0)
MCHC: 33 g/dL (ref 30.0–36.0)
MCV: 94.7 fL (ref 80.0–100.0)
Platelets: 172 10*3/uL (ref 150–400)
RBC: 3.94 MIL/uL — ABNORMAL LOW (ref 4.22–5.81)
RDW: 13.7 % (ref 11.5–15.5)
WBC: 17.6 10*3/uL — ABNORMAL HIGH (ref 4.0–10.5)
nRBC: 0 % (ref 0.0–0.2)

## 2018-10-18 LAB — CUP PACEART REMOTE DEVICE CHECK
Battery Remaining Longevity: 154 mo
Battery Voltage: 3.17 V
Brady Statistic AP VP Percent: 0.01 %
Brady Statistic AP VS Percent: 44.86 %
Brady Statistic AS VP Percent: 0.02 %
Brady Statistic AS VS Percent: 55.11 %
Brady Statistic RA Percent Paced: 45.01 %
Brady Statistic RV Percent Paced: 0.03 %
Date Time Interrogation Session: 20200819041923
Implantable Lead Implant Date: 20200519
Implantable Lead Implant Date: 20200519
Implantable Lead Location: 753859
Implantable Lead Location: 753860
Implantable Lead Model: 5076
Implantable Lead Model: 5076
Implantable Pulse Generator Implant Date: 20200519
Lead Channel Impedance Value: 304 Ohm
Lead Channel Impedance Value: 418 Ohm
Lead Channel Impedance Value: 494 Ohm
Lead Channel Impedance Value: 741 Ohm
Lead Channel Pacing Threshold Amplitude: 0.375 V
Lead Channel Pacing Threshold Amplitude: 0.75 V
Lead Channel Pacing Threshold Pulse Width: 0.4 ms
Lead Channel Pacing Threshold Pulse Width: 0.4 ms
Lead Channel Sensing Intrinsic Amplitude: 11 mV
Lead Channel Sensing Intrinsic Amplitude: 11 mV
Lead Channel Sensing Intrinsic Amplitude: 4.25 mV
Lead Channel Sensing Intrinsic Amplitude: 4.25 mV
Lead Channel Setting Pacing Amplitude: 3.25 V
Lead Channel Setting Pacing Amplitude: 3.25 V
Lead Channel Setting Pacing Pulse Width: 0.4 ms
Lead Channel Setting Sensing Sensitivity: 2 mV

## 2018-10-18 LAB — BASIC METABOLIC PANEL
Anion gap: 12 (ref 5–15)
Anion gap: 9 (ref 5–15)
BUN: 13 mg/dL (ref 8–23)
BUN: 14 mg/dL (ref 8–23)
CO2: 17 mmol/L — ABNORMAL LOW (ref 22–32)
CO2: 22 mmol/L (ref 22–32)
Calcium: 7.8 mg/dL — ABNORMAL LOW (ref 8.9–10.3)
Calcium: 8.3 mg/dL — ABNORMAL LOW (ref 8.9–10.3)
Chloride: 108 mmol/L (ref 98–111)
Chloride: 106 mmol/L (ref 98–111)
Creatinine, Ser: 1.04 mg/dL (ref 0.61–1.24)
Creatinine, Ser: 1.11 mg/dL (ref 0.61–1.24)
GFR calc Af Amer: >60 mL/min (ref >60)
GFR calc Af Amer: >60 mL/min (ref >60)
GFR calc non Af Amer: >60 mL/min (ref >60)
GFR calc non Af Amer: >60 mL/min (ref >60)
Glucose, Bld: 204 mg/dL — ABNORMAL HIGH (ref 70–99)
Glucose, Bld: 187 mg/dL — ABNORMAL HIGH (ref 70–99)
Potassium: 3.8 mmol/L (ref 3.5–5.1)
Potassium: 4.5 mmol/L (ref 3.5–5.1)
Sodium: 137 mmol/L (ref 135–145)
Sodium: 137 mmol/L (ref 135–145)

## 2018-10-18 LAB — POCT I-STAT 7, (LYTES, BLD GAS, ICA,H+H)
Acid-base deficit: 6 mmol/L — ABNORMAL HIGH (ref 0.0–2.0)
Acid-base deficit: 7 mmol/L — ABNORMAL HIGH (ref 0.0–2.0)
Bicarbonate: 22 mmol/L (ref 20.0–28.0)
Bicarbonate: 20.2 mmol/L (ref 20.0–28.0)
Calcium, Ion: 1.24 mmol/L (ref 1.15–1.40)
Calcium, Ion: 1.2 mmol/L (ref 1.15–1.40)
HCT: 36 % — ABNORMAL LOW (ref 39.0–52.0)
HCT: 32 % — ABNORMAL LOW (ref 39.0–52.0)
Hemoglobin: 12.2 g/dL — ABNORMAL LOW (ref 13.0–17.0)
Hemoglobin: 10.9 g/dL — ABNORMAL LOW (ref 13.0–17.0)
O2 Saturation: 99 %
O2 Saturation: 98 %
Potassium: 4.4 mmol/L (ref 3.5–5.1)
Potassium: 4.6 mmol/L (ref 3.5–5.1)
Sodium: 138 mmol/L (ref 135–145)
Sodium: 138 mmol/L (ref 135–145)
TCO2: 24 mmol/L (ref 22–32)
TCO2: 22 mmol/L (ref 22–32)
pCO2 arterial: 55.6 mmHg — ABNORMAL HIGH (ref 32.0–48.0)
pCO2 arterial: 45 mmHg (ref 32.0–48.0)
pH, Arterial: 7.207 — ABNORMAL LOW (ref 7.350–7.450)
pH, Arterial: 7.261 — ABNORMAL LOW (ref 7.350–7.450)
pO2, Arterial: 156 mmHg — ABNORMAL HIGH (ref 83.0–108.0)
pO2, Arterial: 123 mmHg — ABNORMAL HIGH (ref 83.0–108.0)

## 2018-10-18 LAB — BLOOD GAS, ARTERIAL
Acid-base deficit: 7.8 mmol/L — ABNORMAL HIGH (ref 0.0–2.0)
Bicarbonate: 20.8 mmol/L (ref 20.0–28.0)
O2 Saturation: 98.4 %
Patient temperature: 98.6
pCO2 arterial: 55.5 mmHg — ABNORMAL HIGH (ref 32.0–48.0)
pH, Arterial: 7.197 — CL (ref 7.350–7.450)
pO2, Arterial: 163 mmHg — ABNORMAL HIGH (ref 83.0–108.0)

## 2018-10-18 LAB — TYPE AND SCREEN
ABO/RH(D): A POS
Antibody Screen: NEGATIVE

## 2018-10-18 LAB — HEMOGLOBIN AND HEMATOCRIT, BLOOD
HCT: 32.5 % — ABNORMAL LOW (ref 39.0–52.0)
Hemoglobin: 10.6 g/dL — ABNORMAL LOW (ref 13.0–17.0)

## 2018-10-18 LAB — ABO/RH: ABO/RH(D): A POS

## 2018-10-18 SURGERY — NEPHRECTOMY, HAND-ASSISTED, LAPAROSCOPIC
Anesthesia: General

## 2018-10-18 MED ORDER — 0.9 % SODIUM CHLORIDE (POUR BTL) OPTIME
TOPICAL | Status: DC | PRN
  Administered 2018-10-18: 1000 mL

## 2018-10-18 MED ORDER — PHENYLEPHRINE 40 MCG/ML (10ML) SYRINGE FOR IV PUSH (FOR BLOOD PRESSURE SUPPORT)
PREFILLED_SYRINGE | INTRAVENOUS | Status: DC | PRN
  Administered 2018-10-18: 200 ug via INTRAVENOUS

## 2018-10-18 MED ORDER — SODIUM CHLORIDE 0.9 % IV SOLN
8.0000 mg | Freq: Four times a day (QID) | INTRAVENOUS | Status: DC | PRN
  Filled 2018-10-18: qty 4

## 2018-10-18 MED ORDER — ONDANSETRON HCL 4 MG/2ML IJ SOLN
INTRAMUSCULAR | Status: AC
  Filled 2018-10-18: qty 2

## 2018-10-18 MED ORDER — HYDROCORTISONE 1 % EX CREA
1.0000 "application " | TOPICAL_CREAM | Freq: Three times a day (TID) | CUTANEOUS | Status: DC | PRN
  Filled 2018-10-18: qty 28

## 2018-10-18 MED ORDER — ALUM & MAG HYDROXIDE-SIMETH 200-200-20 MG/5ML PO SUSP
30.0000 mL | Freq: Four times a day (QID) | ORAL | Status: DC | PRN
  Administered 2018-10-20 (×2): 30 mL via ORAL
  Filled 2018-10-18 (×2): qty 30

## 2018-10-18 MED ORDER — OXYBUTYNIN CHLORIDE 5 MG PO TABS: 5.0000 mg | ORAL_TABLET | Freq: Three times a day (TID) | ORAL | Status: DC | PRN

## 2018-10-18 MED ORDER — MIDAZOLAM HCL 5 MG/5ML IJ SOLN
INTRAMUSCULAR | Status: DC | PRN
  Administered 2018-10-18: 0.5 mg via INTRAVENOUS

## 2018-10-18 MED ORDER — LACTATED RINGERS IV SOLN
INTRAVENOUS | Status: DC
  Administered 2018-10-18 (×2): via INTRAVENOUS

## 2018-10-18 MED ORDER — ROCURONIUM BROMIDE 10 MG/ML (PF) SYRINGE
PREFILLED_SYRINGE | INTRAVENOUS | Status: AC
  Filled 2018-10-18: qty 10

## 2018-10-18 MED ORDER — MAGIC MOUTHWASH
15.0000 mL | Freq: Four times a day (QID) | ORAL | Status: DC | PRN
  Filled 2018-10-18: qty 15

## 2018-10-18 MED ORDER — ONDANSETRON HCL 4 MG/2ML IJ SOLN: 4.0000 mg | Freq: Once | INTRAMUSCULAR | Status: DC | PRN

## 2018-10-18 MED ORDER — PHENOL 1.4 % MT LIQD
2.0000 | OROMUCOSAL | Status: DC | PRN
  Filled 2018-10-18: qty 177

## 2018-10-18 MED ORDER — HYDROCORTISONE (PERIANAL) 2.5 % EX CREA
1.0000 "application " | TOPICAL_CREAM | Freq: Four times a day (QID) | CUTANEOUS | Status: DC | PRN
  Filled 2018-10-18: qty 28.35

## 2018-10-18 MED ORDER — LIDOCAINE 2% (20 MG/ML) 5 ML SYRINGE
INTRAMUSCULAR | Status: DC | PRN
  Administered 2018-10-18: 60 mg via INTRAVENOUS

## 2018-10-18 MED ORDER — PROCHLORPERAZINE EDISYLATE 10 MG/2ML IJ SOLN
5.0000 mg | INTRAMUSCULAR | Status: DC | PRN
  Administered 2018-10-19: 10 mg via INTRAVENOUS
  Filled 2018-10-18 (×2): qty 2

## 2018-10-18 MED ORDER — DEXAMETHASONE SODIUM PHOSPHATE 10 MG/ML IJ SOLN
INTRAMUSCULAR | Status: DC | PRN
  Administered 2018-10-18: 5 mg via INTRAVENOUS

## 2018-10-18 MED ORDER — DEXAMETHASONE SODIUM PHOSPHATE 10 MG/ML IJ SOLN
INTRAMUSCULAR | Status: AC
  Filled 2018-10-18: qty 1

## 2018-10-18 MED ORDER — SENNA 8.6 MG PO TABS
1.0000 | ORAL_TABLET | Freq: Two times a day (BID) | ORAL | Status: DC
  Administered 2018-10-18 – 2018-10-21 (×7): 8.6 mg via ORAL
  Filled 2018-10-18 (×8): qty 1

## 2018-10-18 MED ORDER — GABAPENTIN 300 MG PO CAPS
300.0000 mg | ORAL_CAPSULE | Freq: Every day | ORAL | Status: AC
  Administered 2018-10-18 – 2018-10-20 (×3): 300 mg via ORAL
  Filled 2018-10-18 (×3): qty 1

## 2018-10-18 MED ORDER — PROPOFOL 10 MG/ML IV BOLUS
INTRAVENOUS | Status: DC | PRN
  Administered 2018-10-18: 150 mg via INTRAVENOUS

## 2018-10-18 MED ORDER — MORPHINE SULFATE (PF) 4 MG/ML IV SOLN
2.0000 mg | INTRAVENOUS | Status: DC | PRN
  Administered 2018-10-20: 2 mg via INTRAVENOUS
  Filled 2018-10-18: qty 1

## 2018-10-18 MED ORDER — LIP MEDEX EX OINT
1.0000 "application " | TOPICAL_OINTMENT | Freq: Two times a day (BID) | CUTANEOUS | Status: DC
  Administered 2018-10-18 – 2018-10-22 (×7): 1 via TOPICAL
  Filled 2018-10-18 (×2): qty 7

## 2018-10-18 MED ORDER — ACETAMINOPHEN 500 MG PO TABS
1000.0000 mg | ORAL_TABLET | ORAL | Status: AC
  Administered 2018-10-18: 07:00:00 1000 mg via ORAL
  Filled 2018-10-18: qty 2

## 2018-10-18 MED ORDER — METHOCARBAMOL 1000 MG/10ML IJ SOLN
1000.0000 mg | Freq: Four times a day (QID) | INTRAVENOUS | Status: DC | PRN
  Filled 2018-10-18: qty 10

## 2018-10-18 MED ORDER — CEFAZOLIN SODIUM-DEXTROSE 2-4 GM/100ML-% IV SOLN
INTRAVENOUS | Status: AC
  Filled 2018-10-18: qty 100

## 2018-10-18 MED ORDER — ALBUMIN HUMAN 5 % IV SOLN
INTRAVENOUS | Status: AC
  Filled 2018-10-18: qty 250

## 2018-10-18 MED ORDER — LACTATED RINGERS IV SOLN
INTRAVENOUS | Status: DC
  Administered 2018-10-18 (×3): via INTRAVENOUS

## 2018-10-18 MED ORDER — ONDANSETRON HCL 4 MG/2ML IJ SOLN: 4.0000 mg | INTRAMUSCULAR | Status: DC | PRN

## 2018-10-18 MED ORDER — ONDANSETRON HCL 4 MG/2ML IJ SOLN
INTRAMUSCULAR | Status: DC | PRN
  Administered 2018-10-18 (×2): 4 mg via INTRAVENOUS

## 2018-10-18 MED ORDER — BUPIVACAINE LIPOSOME 1.3 % IJ SUSP
INTRAMUSCULAR | Status: DC | PRN
  Administered 2018-10-18: 20 mL

## 2018-10-18 MED ORDER — CEFAZOLIN SODIUM-DEXTROSE 2-4 GM/100ML-% IV SOLN
2.0000 g | Freq: Three times a day (TID) | INTRAVENOUS | Status: AC
  Administered 2018-10-18 – 2018-10-19 (×2): 2 g via INTRAVENOUS
  Filled 2018-10-18 (×3): qty 100

## 2018-10-18 MED ORDER — ENALAPRILAT 1.25 MG/ML IV SOLN
0.6250 mg | Freq: Four times a day (QID) | INTRAVENOUS | Status: DC | PRN
  Filled 2018-10-18: qty 1

## 2018-10-18 MED ORDER — CEFAZOLIN SODIUM-DEXTROSE 2-4 GM/100ML-% IV SOLN
2.0000 g | INTRAVENOUS | Status: AC
  Administered 2018-10-18 (×2): 2 g via INTRAVENOUS
  Filled 2018-10-18: qty 100

## 2018-10-18 MED ORDER — LIDOCAINE 2% (20 MG/ML) 5 ML SYRINGE
INTRAMUSCULAR | Status: AC
  Filled 2018-10-18: qty 10

## 2018-10-18 MED ORDER — ACETAMINOPHEN 500 MG PO TABS
1000.0000 mg | ORAL_TABLET | Freq: Three times a day (TID) | ORAL | Status: DC
  Administered 2018-10-18 – 2018-10-22 (×12): 1000 mg via ORAL
  Filled 2018-10-18 (×12): qty 2

## 2018-10-18 MED ORDER — SODIUM CHLORIDE (PF) 0.9 % IJ SOLN
INTRAMUSCULAR | Status: AC
  Filled 2018-10-18: qty 20

## 2018-10-18 MED ORDER — ROCURONIUM BROMIDE 10 MG/ML (PF) SYRINGE
PREFILLED_SYRINGE | INTRAVENOUS | Status: DC | PRN
  Administered 2018-10-18: 10 mg via INTRAVENOUS
  Administered 2018-10-18: 70 mg via INTRAVENOUS
  Administered 2018-10-18: 10 mg via INTRAVENOUS

## 2018-10-18 MED ORDER — GABAPENTIN 300 MG PO CAPS
300.0000 mg | ORAL_CAPSULE | ORAL | Status: AC
  Administered 2018-10-18: 07:00:00 300 mg via ORAL
  Filled 2018-10-18: qty 1

## 2018-10-18 MED ORDER — SODIUM CHLORIDE 0.9 % IV SOLN
INTRAVENOUS | Status: DC | PRN
  Administered 2018-10-18: 10:00:00 25 ug/min via INTRAVENOUS

## 2018-10-18 MED ORDER — SUGAMMADEX SODIUM 500 MG/5ML IV SOLN
INTRAVENOUS | Status: DC | PRN
  Administered 2018-10-18: 200 mg via INTRAVENOUS

## 2018-10-18 MED ORDER — OXYCODONE HCL 5 MG PO TABS
5.0000 mg | ORAL_TABLET | ORAL | Status: DC | PRN
  Administered 2018-10-18 – 2018-10-22 (×6): 5 mg via ORAL
  Filled 2018-10-18 (×6): qty 1

## 2018-10-18 MED ORDER — CEFAZOLIN SODIUM-DEXTROSE 1-4 GM/50ML-% IV SOLN
1.0000 g | Freq: Three times a day (TID) | INTRAVENOUS | Status: DC
  Filled 2018-10-18: qty 50

## 2018-10-18 MED ORDER — FENTANYL CITRATE (PF) 100 MCG/2ML IJ SOLN: 25.0000 ug | INTRAMUSCULAR | Status: DC | PRN

## 2018-10-18 MED ORDER — ONDANSETRON HCL 4 MG/2ML IJ SOLN
4.0000 mg | Freq: Four times a day (QID) | INTRAMUSCULAR | Status: DC | PRN
  Administered 2018-10-18 – 2018-10-19 (×2): 4 mg via INTRAVENOUS
  Filled 2018-10-18 (×2): qty 2

## 2018-10-18 MED ORDER — BUPIVACAINE HCL (PF) 0.25 % IJ SOLN
INTRAMUSCULAR | Status: AC
  Filled 2018-10-18: qty 30

## 2018-10-18 MED ORDER — FENTANYL CITRATE (PF) 250 MCG/5ML IJ SOLN
INTRAMUSCULAR | Status: AC
  Filled 2018-10-18: qty 5

## 2018-10-18 MED ORDER — NITROGLYCERIN IN D5W 200-5 MCG/ML-% IV SOLN
INTRAVENOUS | Status: AC
  Filled 2018-10-18: qty 250

## 2018-10-18 MED ORDER — MENTHOL 3 MG MT LOZG
1.0000 | LOZENGE | OROMUCOSAL | Status: DC | PRN
  Filled 2018-10-18: qty 9

## 2018-10-18 MED ORDER — BUPIVACAINE-EPINEPHRINE 0.5% -1:200000 IJ SOLN
INTRAMUSCULAR | Status: DC | PRN
  Administered 2018-10-18: 40 mL

## 2018-10-18 MED ORDER — PANTOPRAZOLE SODIUM 40 MG PO TBEC
80.0000 mg | DELAYED_RELEASE_TABLET | Freq: Every day | ORAL | Status: DC
  Administered 2018-10-19 – 2018-10-21 (×3): 80 mg via ORAL
  Filled 2018-10-18 (×3): qty 2

## 2018-10-18 MED ORDER — LACTATED RINGERS IV BOLUS: 1000.0000 mL | Freq: Three times a day (TID) | INTRAVENOUS | Status: AC | PRN

## 2018-10-18 MED ORDER — PSYLLIUM 95 % PO PACK
1.0000 | PACK | Freq: Two times a day (BID) | ORAL | Status: DC
  Administered 2018-10-18 – 2018-10-22 (×8): 1 via ORAL
  Filled 2018-10-18 (×8): qty 1

## 2018-10-18 MED ORDER — BUPIVACAINE-EPINEPHRINE (PF) 0.5% -1:200000 IJ SOLN
INTRAMUSCULAR | Status: AC
  Filled 2018-10-18: qty 30

## 2018-10-18 MED ORDER — SODIUM CHLORIDE 0.9 % IV SOLN
INTRAVENOUS | Status: DC
  Administered 2018-10-18 – 2018-10-19 (×2): via INTRAVENOUS

## 2018-10-18 MED ORDER — FENTANYL CITRATE (PF) 100 MCG/2ML IJ SOLN
INTRAMUSCULAR | Status: DC | PRN
  Administered 2018-10-18: 25 ug via INTRAVENOUS
  Administered 2018-10-18: 50 ug via INTRAVENOUS
  Administered 2018-10-18: 25 ug via INTRAVENOUS
  Administered 2018-10-18 (×3): 50 ug via INTRAVENOUS

## 2018-10-18 MED ORDER — CEFAZOLIN SODIUM-DEXTROSE 2-4 GM/100ML-% IV SOLN: 2.0000 g | INTRAVENOUS | Status: DC

## 2018-10-18 MED ORDER — ALBUMIN HUMAN 5 % IV SOLN
INTRAVENOUS | Status: DC | PRN
  Administered 2018-10-18 (×2): via INTRAVENOUS

## 2018-10-18 MED ORDER — SODIUM CHLORIDE 0.9% FLUSH
INTRAVENOUS | Status: DC | PRN
  Administered 2018-10-18: 20 mL via INTRAVENOUS

## 2018-10-18 MED ORDER — ZOLPIDEM TARTRATE 5 MG PO TABS
5.0000 mg | ORAL_TABLET | Freq: Every evening | ORAL | Status: DC | PRN
  Administered 2018-10-20: 5 mg via ORAL
  Filled 2018-10-18: qty 1

## 2018-10-18 MED ORDER — PROPOFOL 10 MG/ML IV BOLUS
INTRAVENOUS | Status: AC
  Filled 2018-10-18: qty 20

## 2018-10-18 MED ORDER — DOCUSATE SODIUM 100 MG PO CAPS
100.0000 mg | ORAL_CAPSULE | Freq: Two times a day (BID) | ORAL | Status: DC
  Administered 2018-10-18 – 2018-10-21 (×7): 100 mg via ORAL
  Filled 2018-10-18 (×8): qty 1

## 2018-10-18 MED ORDER — PROPOFOL 10 MG/ML IV BOLUS: INTRAVENOUS | Status: DC | PRN

## 2018-10-18 MED ORDER — MIDAZOLAM HCL 2 MG/2ML IJ SOLN
INTRAMUSCULAR | Status: AC
  Filled 2018-10-18: qty 2

## 2018-10-18 MED ORDER — GUAIFENESIN-DM 100-10 MG/5ML PO SYRP: 10.0000 mL | ORAL_SOLUTION | ORAL | Status: DC | PRN

## 2018-10-18 MED ORDER — PHENYLEPHRINE HCL-NACL 10-0.9 MG/250ML-% IV SOLN
INTRAVENOUS | Status: AC
  Filled 2018-10-18: qty 250

## 2018-10-18 MED ORDER — DIPHENHYDRAMINE HCL 50 MG/ML IJ SOLN: 12.5000 mg | Freq: Four times a day (QID) | INTRAMUSCULAR | Status: DC | PRN

## 2018-10-18 MED ORDER — DIPHENHYDRAMINE HCL 12.5 MG/5ML PO ELIX: 12.5000 mg | ORAL_SOLUTION | Freq: Four times a day (QID) | ORAL | Status: DC | PRN

## 2018-10-18 SURGICAL SUPPLY — 82 items
ADH SKN CLS APL DERMABOND .7 (GAUZE/BANDAGES/DRESSINGS)
AGENT HMST KT MTR STRL THRMB (HEMOSTASIS) ×2
APL ESCP 34 STRL LF DISP (HEMOSTASIS) ×2
APL PRP STRL LF DISP 70% ISPRP (MISCELLANEOUS) ×4
APPLICATOR SURGIFLO ENDO (HEMOSTASIS) ×2 IMPLANT
APPLIER CLIP 5 13 M/L LIGAMAX5 (MISCELLANEOUS)
APR CLP MED LRG 5 ANG JAW (MISCELLANEOUS)
BAG SPEC THK2 15X12 ZIP CLS (MISCELLANEOUS) ×2
BAG ZIPLOCK 12X15 (MISCELLANEOUS) ×4 IMPLANT
BINDER ABDOMINAL 12 ML 46-62 (SOFTGOODS) ×2 IMPLANT
CABLE HIGH FREQUENCY MONO STRZ (ELECTRODE) ×10 IMPLANT
CHLORAPREP W/TINT 26 (MISCELLANEOUS) ×8 IMPLANT
CLIP APPLIE 5 13 M/L LIGAMAX5 (MISCELLANEOUS) IMPLANT
CLIP VESOLOCK LG 6/CT PURPLE (CLIP) ×4 IMPLANT
CLIP VESOLOCK MED LG 6/CT (CLIP) IMPLANT
CLIP VESOLOCK XL 6/CT (CLIP) IMPLANT
CLOSURE WOUND 1/2 X4 (GAUZE/BANDAGES/DRESSINGS) ×2
COVER MAYO STAND STRL (DRAPES) ×2 IMPLANT
COVER SURGICAL LIGHT HANDLE (MISCELLANEOUS) ×8 IMPLANT
COVER WAND RF STERILE (DRAPES) ×4 IMPLANT
CUTTER FLEX LINEAR 45M (STAPLE) ×2 IMPLANT
DECANTER SPIKE VIAL GLASS SM (MISCELLANEOUS) ×4 IMPLANT
DERMABOND ADVANCED (GAUZE/BANDAGES/DRESSINGS)
DERMABOND ADVANCED .7 DNX12 (GAUZE/BANDAGES/DRESSINGS) IMPLANT
DEVICE SECURE STRAP 25 ABSORB (INSTRUMENTS) ×2 IMPLANT
DEVICE TROCAR PUNCTURE CLOSURE (ENDOMECHANICALS) ×4 IMPLANT
DRAIN CHANNEL 19F RND (DRAIN) ×2 IMPLANT
DRAPE INCISE IOBAN 66X45 STRL (DRAPES) ×4 IMPLANT
DRAPE UTILITY XL STRL (DRAPES) ×2 IMPLANT
DRAPE WARM FLUID 44X44 (DRAPES) ×4 IMPLANT
DRSG OPSITE POSTOP 4X6 (GAUZE/BANDAGES/DRESSINGS) ×2 IMPLANT
DRSG TEGADERM 4X4.75 (GAUZE/BANDAGES/DRESSINGS) ×4 IMPLANT
ELECT PENCIL ROCKER SW 15FT (MISCELLANEOUS) ×4 IMPLANT
ELECT REM PT RETURN 15FT ADLT (MISCELLANEOUS) ×6 IMPLANT
EVACUATOR SILICONE 100CC (DRAIN) ×2 IMPLANT
GAUZE SPONGE 2X2 8PLY STRL LF (GAUZE/BANDAGES/DRESSINGS) IMPLANT
GLOVE BIO SURGEON STRL SZ 6.5 (GLOVE) ×3 IMPLANT
GLOVE BIO SURGEON STRL SZ7.5 (GLOVE) ×4 IMPLANT
GLOVE BIO SURGEONS STRL SZ 6.5 (GLOVE) ×1
GLOVE ECLIPSE 8.0 STRL XLNG CF (GLOVE) ×4 IMPLANT
GLOVE INDICATOR 8.0 STRL GRN (GLOVE) ×4 IMPLANT
GOWN STRL REUS W/TWL LRG LVL3 (GOWN DISPOSABLE) ×8 IMPLANT
GOWN STRL REUS W/TWL XL LVL3 (GOWN DISPOSABLE) ×12 IMPLANT
HEMOSTAT SURGICEL 4X8 (HEMOSTASIS) ×2 IMPLANT
IRRIG SUCT STRYKERFLOW 2 WTIP (MISCELLANEOUS) ×4
IRRIGATION SUCT STRKRFLW 2 WTP (MISCELLANEOUS) IMPLANT
KIT BASIN OR (CUSTOM PROCEDURE TRAY) ×8 IMPLANT
KIT TURNOVER KIT A (KITS) ×2 IMPLANT
LIGASURE VESSEL 5MM BLUNT TIP (ELECTROSURGICAL) ×2 IMPLANT
MARKER SKIN DUAL TIP RULER LAB (MISCELLANEOUS) ×2 IMPLANT
MESH VENTRALIGHT ST 8X10 (Mesh General) ×2 IMPLANT
NDL SPNL 22GX3.5 QUINCKE BK (NEEDLE) IMPLANT
NEEDLE SPNL 22GX3.5 QUINCKE BK (NEEDLE) IMPLANT
PAD POSITIONING PINK XL (MISCELLANEOUS) ×4 IMPLANT
RELOAD 45 VASCULAR/THIN (ENDOMECHANICALS) ×12 IMPLANT
RELOAD STAPLE 45 2.5 WHT GRN (ENDOMECHANICALS) IMPLANT
SCISSORS LAP 5X35 DISP (ENDOMECHANICALS) ×4 IMPLANT
SET TUBE SMOKE EVAC HIGH FLOW (TUBING) ×4 IMPLANT
SLEEVE ADV FIXATION 5X100MM (TROCAR) ×4 IMPLANT
SPONGE GAUZE 2X2 STER 10/PKG (GAUZE/BANDAGES/DRESSINGS) ×2
SPONGE LAP 18X18 RF (DISPOSABLE) ×2 IMPLANT
STAPLER VISISTAT 35W (STAPLE) IMPLANT
STRIP CLOSURE SKIN 1/2X4 (GAUZE/BANDAGES/DRESSINGS) ×6 IMPLANT
SURGIFLO W/THROMBIN 8M KIT (HEMOSTASIS) ×2 IMPLANT
SUT ETHILON 3 0 PS 1 (SUTURE) IMPLANT
SUT MNCRL AB 4-0 PS2 18 (SUTURE) ×4 IMPLANT
SUT PDS AB 1 CT1 27 (SUTURE) ×6 IMPLANT
SUT PDS AB 1 TP1 96 (SUTURE) ×8 IMPLANT
SUT PROLENE 1 CT 1 30 (SUTURE) ×12 IMPLANT
SUT PROLENE 2 0 CT2 30 (SUTURE) ×2 IMPLANT
SUT VICRYL 0 UR6 27IN ABS (SUTURE) ×2 IMPLANT
SYS LAPSCP GELPORT 120MM (MISCELLANEOUS) ×4
SYSTEM LAPSCP GELPORT 120MM (MISCELLANEOUS) ×2 IMPLANT
TOWEL OR 17X26 10 PK STRL BLUE (TOWEL DISPOSABLE) ×14 IMPLANT
TRAY FOLEY MTR SLVR 16FR STAT (SET/KITS/TRAYS/PACK) ×4 IMPLANT
TRAY LAPAROSCOPIC (CUSTOM PROCEDURE TRAY) ×8 IMPLANT
TROCAR ADV FIXATION 11X100MM (TROCAR) IMPLANT
TROCAR ADV FIXATION 5X100MM (TROCAR) ×6 IMPLANT
TROCAR BLADELESS OPT 5 100 (ENDOMECHANICALS) ×4 IMPLANT
TROCAR UNIVERSAL OPT 12M 100M (ENDOMECHANICALS) ×4 IMPLANT
TROCAR XCEL 12X100 BLDLESS (ENDOMECHANICALS) ×4 IMPLANT
YANKAUER SUCT BULB TIP 10FT TU (MISCELLANEOUS) ×4 IMPLANT

## 2018-10-18 NOTE — Progress Notes (Signed)
ABG results received from respiratory therapist and given to Dr. Lanetta Inch.  Dr. Lanetta Inch to report these results to Dr. Fransisco Beau.

## 2018-10-18 NOTE — Interval H&P Note (Signed)
History and Physical Interval Note:  10/18/2018 8:36 AM  [name]  has presented today for surgery, with the diagnosis of LEFT REANL MASS, VENTRAL HERNIA.  The various methods of treatment have been discussed with the patient and family. After consideration of risks, benefits and other options for treatment, the patient has consented to  Procedure(s): LEFT HAND ASSISTED LAPAROSCOPIC RADICAL NEPHRECTOMY WITH ADRENALECTOMY (Left) LAPAROSCOPIC ASSISTED VENTRAL WALL HERNIA REPAIR WITH MESH (N/A) as a surgical intervention.  The patient's history has been reviewed, patient examined, no change in status, stable for surgery.  I have reviewed the patient's chart and labs.  Questions were answered to the patient's satisfaction.    I have re-reviewed the the patient's records, history, medications, and allergies.  I have re-examined the patient.  I again discussed intraoperative plans and goals of post-operative recovery.  The patient agrees to proceed.  [name]  07-06-1940 [id number]  Patient Care Team: [name], MD as PCP - General Nahser, Wonda Cheng, MD as PCP - Cardiology (Cardiology) Irene Shipper, MD as Consulting Physician (Gastroenterology) Michael Boston, MD as Consulting Physician (General Surgery) Thompson Grayer, MD as Consulting Physician (Cardiology) Lucas Mallow, MD as Consulting Physician (Urology) Tharon Aquas, MD as Resident (Urology)  Patient Active Problem List   Diagnosis Date Noted  . Left renal mass 06/23/2018    Priority: High  . Chronic anticoagulation 08/22/2018    Priority: Medium  . 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 08/22/2018    Priority: Medium  . Recurrent ventral incisional hernia 08/22/2018  . Sick sinus syndrome ([place]) 07/18/2018  . Intractable low back pain 06/23/2018  . Pathologic vertebral fracture 06/23/2018  . 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 Lung nodule seen on imaging study 06/23/2018   . Abdominal aortic aneurysm (AAA) without rupture ([place]) 06/23/2018  . Essential hypertension 05/01/2018  . Recurrent syncope 04/24/2018  . History of anaphylaxis 04/07/2016  . Bradycardia 03/22/2016  . OAB (overactive bladder) 01/06/2016  . Erectile dysfunction due to arterial insufficiency 04/23/2015  . Hyperlipidemia with target LDL less than 70 11/28/2012  . Routine general medical examination at a health care facility 11/28/2012  . Meralgia paresthetica of left side 09/21/2012  . PAF (paroxysmal atrial fibrillation) ([facility]) 04/16/2011  . GERD (gastroesophageal reflux disease) 11/10/2010  . BPH (benign prostatic hyperplasia) 01/19/2010  . HYPERTRIGLYCERIDEMIA 11/22/2007    Past Medical History:  Diagnosis Date  . Diverticulosis   . Esophageal reflux   . Esophageal stricture   . HA (headache)   . Hearing loss   . Hiatal hernia   . Hypertriglyceridemia   . Hypoglycemia   . Obesity, unspecified   . PAF (paroxysmal atrial fibrillation) (HCC)    Echo with normal LV function and mild LVH  . Presence of permanent cardiac pacemaker   . Renal mass    left  . Syncope    presented with atrial fib converted with Diltiazem  . Systemic inflammatory response syndrome ([place] [facility]) 06/26/2018    Past Surgical History:  Procedure Laterality Date  . CYSTOSCOPY WITH URETEROSCOPY AND STENT PLACEMENT Left 08/16/2018   Procedure: CYSTOSCOPY WITH LEFT RETROGRADE PYELOGRAM, BALLOON DILATION LEFT URETER, STENT PLACEMENT;  Surgeon: Lucas Mallow, MD;  [facility];  Service: Urology;  Laterality: Left;  . HERNIA REPAIR    . INSERT / REPLACE / REMOVE PACEMAKER    . LOOP RECORDER INSERTION N/A 04/26/2018   Procedure: LOOP RECORDER INSERTION;  Surgeon:  Thompson Grayer, MD;  Location: [facility];  Service: Cardiovascular;  Laterality: N/A;  . LOOP RECORDER REMOVAL N/A 07/18/2018   Procedure: LOOP RECORDER REMOVAL;  Surgeon: Thompson Grayer, MD;  Location: [facility];  Service:  Cardiovascular;  Laterality: N/A;  . NASAL SEPTUM SURGERY    . PACEMAKER IMPLANT N/A 07/18/2018   Procedure: PACEMAKER IMPLANT;  Surgeon: Thompson Grayer, MD;  Location: [facility];  Service: Cardiovascular;  Laterality: N/A;  . TONSILLECTOMY    . TONSILLECTOMY    . UMBILICAL HERNIA REPAIR  1993  . WISDOM TOOTH EXTRACTION     about 78 years old    Social History   Socioeconomic History  . Marital status: Married    Spouse name: Not on file  . Number of children: 1  . Years of education: Not on file  . Highest education level: Not on file  Occupational History  . 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 Financial resource strain: Not hard at all  . Food insecurity    Worry: Never true    Inability: Never true  . Transportation needs    Medical: No    Non-medical: No  Tobacco Use  . Smoking status: Former Smoker    Quit date: 03/02/1987    Years since quitting: 31.6  . Smokeless tobacco: Never Used  Substance and Sexual Activity  . Alcohol use: No    Alcohol/week: 0.0 standard drinks  . Drug use: No  . Sexual activity: Not Currently  Lifestyle  . Physical activity    Days per week: 5 days    Minutes per session: 50 min  . Stress: Not at all  Relationships  . Social connections    Talks on phone: More than three times a week    Gets together: More than three times a week    Attends religious service: 1 to 4 times per year    Active member of club or organization: Yes    Attends meetings of clubs or organizations: More than 4 times per year    Relationship status: Married  . Intimate partner violence    Fear of current or ex partner: No    Emotionally abused: No    Physically abused: No    Forced sexual activity: No  Other Topics Concern  . Not on file  Social History Narrative  . Not on file    Family History  Problem Relation Age of Onset  . Emphysema Father   . Gallbladder disease Sister   . Bipolar disorder Sister   . Bipolar disorder Paternal  Grandmother   . Colon cancer Neg Hx     Medications Prior to Admission  Medication Sig Dispense Refill Last Dose  . Carboxymethylcellulose Sodium (REFRESH LIQUIGEL) 1 % GEL Place 1 drop into both eyes at bedtime.   10/17/2018 at Unknown time  . Carboxymethylcellulose Sodium (REFRESH TEARS OP) Place 1 drop into both eyes 2 (two) times daily.    10/18/2018 at 0500  . ELIQUIS 5 MG TABS tablet Take 1 tablet by mouth twice daily (Patient taking differently: Take 5 mg by mouth 2 (two) times daily. ) 60 tablet 5 10/15/2018  . esomeprazole (NEXIUM) 40 MG capsule Take 1 capsule (40 mg total) by mouth daily. (Patient taking differently: Take 40 mg by mouth daily before breakfast. ) 30 capsule 11 10/18/2018 at 0430  . naproxen sodium (ALEVE) 220 MG tablet Take 550 mg by mouth daily as needed (pain).   Past  Week at Unknown time  . omega-3 acid ethyl esters (LOVAZA) 1 g capsule Take 2 capsules by mouth twice daily (Patient taking differently: Take 2 g by mouth 2 (two) times daily. ) 360 capsule 0 10/15/2018  . phenylephrine (4-WAY FAST ACTING) 1 % nasal spray Place 1 drop into both nostrils every 6 (six) hours as needed for congestion.   10/18/2018 at 0400  . solifenacin (VESICARE) 10 MG tablet Take 1 tablet (10 mg total) by mouth daily. 90 tablet 1 10/18/2018 at 0430  . atorvastatin (LIPITOR) 20 MG tablet Take 1 tablet (20 mg total) by mouth daily. (Patient not taking: Reported on 07/13/2018) 90 tablet 1 Not Taking at Unknown time  . OVER THE COUNTER MEDICATION Take 1 tablet by mouth at bedtime as needed (sleep). Midnight otc sleep aid   More than a month at Unknown time    Current Facility-Administered Medications  Medication Dose Route Frequency Provider Last Rate Last Dose  . bupivacaine liposome (EXPAREL) 1.3 % injection 266 mg  20 mL Infiltration Once Marton Redwood III, MD      . ceFAZolin (ANCEF) IVPB 2g/100 mL premix  2 g Intravenous 30 min Pre-Op Marton Redwood III, MD      . lactated ringers infusion    Intravenous Continuous Audry Pili, MD 10 mL/hr at 10/18/18 [phone]    . lactated ringers infusion   Intravenous Continuous Audry Pili, MD 50 mL/hr at 10/18/18 0719    . nitroGLYCERIN 0.2 mg/mL in dextrose 5 % infusion           . phenylephrine (NEOSYNEPHRINE) 10-0.9 MG/250ML-% infusion            Facility-Administered Medications Ordered in Other Encounters  Medication Dose Route Frequency Provider Last Rate Last Dose  . fentaNYL (SUBLIMAZE) injection    Anesthesia Intra-op Key, Kristopher, CRNA   50 mcg at 10/18/18 0758  . midazolam (VERSED) 5 MG/5ML injection    Anesthesia Intra-op Key, Kristopher, CRNA   0.5 mg at 10/18/18 0758  . ondansetron (ZOFRAN) injection    Anesthesia Intra-op Key, Kristopher, CRNA   4 mg at 10/18/18 0754     Allergies  Allergen Reactions  . Hydrocodone     Caused confusion   . Monosodium Glutamate Other (See Comments)    Caused headaches  . Naproxen Sodium     Mouth blisters with prolonged use  . Other     Perfume - headaches     BP (!) 144/69   Pulse 64   Temp 97.8 F (36.6 C) (Oral)   Resp 18   Ht 5\' 7"  (1.702 m)   Wt 84.4 kg   SpO2 93%   BMI 29.13 kg/m   Labs: Results for orders placed or performed during the hospital encounter of 10/18/18 (from the past 48 hour(s))  Type and screen [facility]     Status: None   Collection Time: 10/18/18  7:15 AM  Result Value Ref Range   ABO/RH(D) A POS    Antibody Screen NEG    Sample Expiration      10/21/2018,2359 Performed at [facility] [address], [place], [place] [address]   ABO/Rh     Status: None (Preliminary result)   Collection Time: 10/18/18  7:15 AM  Result Value Ref Range   ABO/RH(D)      A POS Performed at [facility], [place] [address], [place], [place] [address]     Imaging /  Studies: No results found.   Adin Hector, M.D., F.A.C.S. Gastrointestinal and Minimally Invasive Surgery [facility] [address], [place], [place] [address] [phone] Main / Paging  10/18/2018 8:36 AM     Adin Hector

## 2018-10-18 NOTE — Op Note (Signed)
Operative Note  Preoperative diagnosis:  1.  Left renal and adrenal mass  Postoperative diagnosis: 1.  Left renal and adrenal mass  Procedure(s): 1.  Left hand assisted laparoscopic radical nephrectomy with adrenalectomy  Surgeon: Link Snuffer, MD  Assistants: Tharon Aquas, MD, resident an assistant was needed throughout the case further expertise in assisting with a laparoscopic surgery, including visualization with the camera, passing instruments, etc.  Anesthesia: General  Complications: None  EBL: 500 cc  Specimens: 1.  Left kidney and adrenal 2.  Perinephric tissue  Drains/Catheters: 1. Foley catheter  Intraoperative findings: Left kidney and adrenal removed entirely.  No obvious pancreatic injury.  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  After discussion of different options, the patient elected to undergo the above operation.  Description of procedure:  The patient was identified and consent was obtained.  The patient was taken to the operating room and placed in the supine position.  The patient was placed under general anesthesia.  Perioperative antibiotics were administered.  The patient was placed in left lateral position at approximately 65 degrees and all pressure points were padded.  Patient was prepped and draped in a standard sterile fashion and a timeout was performed.  An 8 cm periumbilical incision was made sharply into the skin.  This was carried down with Bovie electrocautery down to the anterior rectus sheath which was divided with electrocautery.  The underlying musculature was separated in the midline.  Sharp dissection with Metzenbaum scissors was used to open up the posterior sheath and peritoneum.  This was extended with electrocautery taking great care not to use cautery near the bowel.  The hand assist port was secured into the incision.  I made  sure no bowel was trapped within this.  A 12 mm port was inserted through the hand assist port and the abdomen was insufflated to a pressure of 15.  A 12 mm port was placed lateral as well as superior to the hand assist port, each 1 about a hand width away. Please note that all ports were placed under direct visualization with the camera.  The colon was first dissected medially by incising along the white line of Toldt.  After medializing the colon, the kidney was dissected laterally and medially as well as superiorly.  The ureter was identified and sharply entered and the ureteral stent was withdrawn.  Inferior attachments as well as the ureter and gonadal vein were divided with LigaSure device. I continued to carefully dissect medially and identified the renal hilum.  The renal vein and renal artery were divided with a 45 mm vascular staple load.  Superior attachments were then released using LigaSure device as well as blunt dissection.  Once the entire kidney and surrounding Gerota's fascia was freed, the specimen was withdrawn from the midline incision and passed off for permanent specimen.  I reinspected the abdomen.  There was some firm tissue just lateral to the nephrectomy site.  I therefore used a vascular staple load to staple across this and passed it off for separate specimen.  Dr. gross inspected this area as well to ensure no pancreatic injury and there was no obvious injury.  There was no obvious injury to bowel or the spleen as well.  FloSeal and Surgicel was applied to the nephrectomy bed.  This concluded my portion of the operation.  At this point, Dr. gross resume care of  the patient for his portion of the operation which was hernia repair with possible mesh.  Plan: Stat labs will be obtained.  Anticipate the patient will be in the hospital 1-2 nights as long as he does well.

## 2018-10-18 NOTE — Transfer of Care (Signed)
Immediate Anesthesia Transfer of Care Note  Patient: [name]  Procedure(s) Performed: LEFT HAND ASSISTED LAPAROSCOPIC RADICAL NEPHRECTOMY WITH ADRENALECTOMY (Left ) LAPAROSCOPIC ASSISTED VENTRAL WALL HERNIA REPAIR WITH MESH (N/A )  Patient Location: PACU  Anesthesia Type:General  Level of Consciousness: oriented and patient cooperative  Airway & Oxygen Therapy: Patient Spontanous Breathing and Patient connected to face mask oxygen  Post-op Assessment: Report given to RN and Patient moving all extremities X 4  Post vital signs: Reviewed and stable  Last Vitals:  Vitals Value Taken Time  BP    Temp    Pulse    Resp    SpO2      Last Pain:  Vitals:   10/18/18 0649  TempSrc:   PainSc: 0-No pain         Complications: No apparent anesthesia complications

## 2018-10-18 NOTE — Anesthesia Procedure Notes (Signed)
Arterial Line Insertion Start/End8/19/2020 7:45 AM, 10/18/2018 8:03 AM Performed by: Audry Pili, MD, CRNA  Patient location: Pre-op. Preanesthetic checklist: patient identified, IV checked, risks and benefits discussed, surgical consent, monitors and equipment checked and pre-op evaluation radial was placed Hand hygiene performed   Attempts: 2 Procedure performed without using ultrasound guided technique. Ultrasound Notes:anatomy identified, needle tip was noted to be adjacent to the nerve/plexus identified and no ultrasound evidence of intravascular and/or intraneural injection Following insertion, line sutured, dressing applied and Biopatch. Post procedure assessment: normal  Patient tolerated the procedure well with no immediate complications.

## 2018-10-18 NOTE — Anesthesia Procedure Notes (Signed)
Procedure Name: Intubation Performed by: Lavina Hamman, CRNA Pre-anesthesia Checklist: Patient identified, Emergency Drugs available, Suction available, Patient being monitored and Timeout performed Patient Re-evaluated:Patient Re-evaluated prior to induction Oxygen Delivery Method: Circle system utilized Preoxygenation: Pre-oxygenation with 100% oxygen Induction Type: IV induction Ventilation: Mask ventilation without difficulty Laryngoscope Size: 4 and Glidescope Grade View: Grade I Tube type: Oral Tube size: 7.5 mm Number of attempts: 2 Airway Equipment and Method: Stylet Placement Confirmation: ETT inserted through vocal cords under direct vision,  positive ETCO2,  CO2 detector and breath sounds checked- equal and bilateral Tube secured with: Tape Dental Injury: Teeth and Oropharynx as per pre-operative assessment

## 2018-10-18 NOTE — Anesthesia Postprocedure Evaluation (Signed)
Anesthesia Post Note  Patient: [name]  Procedure(s) Performed: LEFT HAND ASSISTED LAPAROSCOPIC RADICAL NEPHRECTOMY WITH ADRENALECTOMY (Left ) LAPAROSCOPIC ASSISTED VENTRAL WALL HERNIA REPAIR WITH MESH (N/A )     Patient location during evaluation: PACU Anesthesia Type: General Level of consciousness: awake and alert Pain management: pain level controlled Vital Signs Assessment: post-procedure vital signs reviewed and stable Respiratory status: spontaneous breathing, nonlabored ventilation, respiratory function stable and patient connected to nasal cannula oxygen Cardiovascular status: blood pressure returned to baseline and stable Postop Assessment: no apparent nausea or vomiting Anesthetic complications: no    Last Vitals:  Vitals:   10/18/18 1515 10/18/18 1530  BP: 127/79 130/78  Pulse: 71 77  Resp: 14 18  Temp:    SpO2: 93% 94%    Last Pain:  Vitals:   10/18/18 1530  TempSrc:   PainSc: Eugene Brock

## 2018-10-18 NOTE — H&P (Signed)
CC/HPI: Cc: Left renal mass.  HPI:  07/20/2018  78 year old male with multiple cardiac comorbidities including atrial fibrillation on eliquis. I recently evaluated the patient in the hospital, where he underwent a CT of the chest, abdomen and pelvis to rule out aortic dissection. This found an incidental large left renal mass that was approximately 7.5 cm with an associated mass in the adrenal gland. He was also found to have compression fractures at T1 and L1 and subsequently underwent an MRI of the spine that was notable for likely traumatic rather than pathologic fractures. There was no chest lymphadenopathy on the CTA. His creatinine was 0.9 with a GFR greater than 60.   The patient continues to have significant back pain secondary to his compression fractures. He denies any hematuria or dysuria. Urinalysis is negative today.   The patient's cardiologist is Thompson Grayer, M.D. He placed a pacemaker in. The patient yesterday. Per his note on 07/04/2018, he states that it is okay to proceed with any required urology procedures without further cardiovascular testing.   08/23/2018  Patient is status post left retrograde pyelogram. I was unable to access the ureter and therefore unable perform a diagnostic ureteroscopy. However, retrograde pyelogram did not reveal any obvious filling defects. Did not obviously look like any involvement of the collecting system. 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 Has not smoked since 06/30/1978.   Tobacco Use Assessment Completed: Used Tobacco in last 30 days? Does not drink caffeine.    REVIEW OF SYSTEMS:    GU Review Male:   Patient reports frequent urination and get up at night to urinate. Patient denies hard to postpone urination, burning/ pain with urination, leakage of urine, stream starts and stops, trouble starting your stream, have to strain to urinate , erection problems, and penile pain.  Gastrointestinal (Upper):   Patient denies nausea, vomiting, and indigestion/ heartburn.  Gastrointestinal (Lower):   Patient denies diarrhea and constipation.  Constitutional:   Patient denies fever, night sweats, weight loss, and fatigue.  Skin:   Patient denies itching and skin rash/ lesion.  Eyes:   Patient denies blurred vision and double vision.  Ears/ Nose/ Throat:   Patient denies sore throat and sinus problems.  Hematologic/Lymphatic:   Patient denies swollen glands and easy bruising.  Cardiovascular:   Patient denies leg swelling and chest pains.  Respiratory:   Patient denies cough and shortness of breath.  Endocrine:   Patient denies excessive thirst.  Musculoskeletal:   Patient denies back pain and joint pain.  Neurological:   Patient denies headaches and dizziness.  Psychologic:   Patient denies depression and anxiety.   VITAL SIGNS:      08/23/2018 03:14 PM  BP 133/82 mmHg  Pulse 92 /min  Temperature 97.7 F / 36.5 C  MULTI-SYSTEM PHYSICAL EXAMINATION:    Constitutional: Well-nourished. No physical deformities. Normally developed. Good grooming.  Respiratory: No labored breathing, no use of accessory muscles.   Cardiovascular: Normal temperature, adequate perfusion of extremities  Skin: No paleness, no jaundice  Neurologic / Psychiatric: Oriented to time, oriented to  place, oriented to person. No depression, no anxiety, no agitation.  Gastrointestinal: No mass, no tenderness, no rigidity, non obese abdomen. Large ventral hernia above the umbilicus, reducible  Eyes: Normal conjunctivae. Normal eyelids.  Musculoskeletal: Normal gait and station of head and neck.     PAST DATA REVIEWED:  Source Of History:  Patient  Records Review:   Previous Patient Records   PROCEDURES:          Urinalysis w/Scope - 81001 Dipstick Dipstick Cont'd Micro  Color: Yellow Bilirubin: Neg WBC/hpf: 0 - 5/hpf  Appearance: Clear Ketones: Neg RBC/hpf: 10 - 20/hpf  Specific Gravity: 1.020 Blood: 3+ Bacteria: NS (Not Seen)  pH: 5.5 Protein: Trace Cystals: NS (Not Seen)  Glucose: Neg Urobilinogen: 0.2 Casts: NS (Not Seen)    Nitrites: Neg Trichomonas: Not Present    Leukocyte Esterase: Trace Mucous: Not Present      Epithelial Cells: NS (Not Seen)      Yeast: NS (Not Seen)      Sperm: Not Present    Notes:      ASSESSMENT:      ICD-10 Details  1 GU:   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 Risks and benefits discussed including but not limited to bleeding, infection, injury to surrounding structures, myocardial infarction, stroke, death.   I'll plan to do this concurrently with Dr. Johney Maine, who will perform hernia repair, possibly with mesh. At the time of the closure.   Cc: [name], M.D.  Dr. Johney Maine, M.D.  Dr. Rayann Heman, M.D.    Signed by Link Snuffer, III, M.D. on 08/24/18 at 9:16 AM (EDT

## 2018-10-18 NOTE — H&P (Addendum)
Frank [name]  DOB: 06-Aug-1940 Married / Language: Frank [name] / Race: White Male Patient Care Team: [name], MD as PCP - General Nahser, Wonda Cheng, MD as PCP - Cardiology (Cardiology) Irene Shipper, MD as Consulting Physician (Gastroenterology) Michael Boston, MD as Consulting Physician (General Surgery) Thompson Grayer, MD as Consulting Physician (Cardiology) Lucas Mallow, MD as Consulting Physician (Urology)  ` ` Patient sent for surgical consultation at the request of Dr Gloriann Loan, [facility]  Chief Complaint: Incisional hernia. ` ` The patient is a pleasant elderly gentleman with cardiac issues. I believe sick sinus syndrome with bradycardia and intermittent syncopal events improved with pacemaker. Chronically anticoagulated. He had an incidental large left kidney mass noted on a CAT scan. Sent for urological consultation. Undergoing workup that suspicion for renal cell cancer. Given the fact that the patient most likely will need surgical intervention, patient wondered if his incisional hernia should be fixed. He recalls having a hernia repaired at his belly button in the early 80s by Dr. Rosana Hoes who has since retired. Noted some bulging above at the old hernia site. Has not been vertically painful bothersome but its gradually gotten larger in size. His wife notes that he occasionally does have some complaints with it. He has intentionally lost about 10 pounds in the past few years. Wonders if that made it more noticeable. Usually moves his bowels every day. He is to be able walk a mile a day but given recent back issues with some healing fractures, he is a little less active. Occasionally uses a cane for balance. Hard of hearing. Does have a history of paroxysmal atrial fibrillation. He has a loop recorder Medtronic Azure XT MRI conditional dual-chamber pacemaker for symptomatic sinus pauses, sick sinus syndrome, and syncope. He is not diabetic. He does not  smoke.  No personal nor family history of GI/colon cancer, inflammatory bowel disease, irritable bowel syndrome, allergy such as Celiac Sprue, dietary/dairy problems, colitis, ulcers nor gastritis. No recent sick contacts/gastroenteritis. No travel outside the country. No changes in diet. No dysphagia to solids or liquids. No significant heartburn or reflux. No hematochezia, hematemesis, coffee ground emesis. No evidence of prior gastric/peptic ulceration.  Cleared & ready for surgery  (Review of systems as stated in this history (HPI) or in the review of systems. Otherwise all other 12 point ROS are negative) ` ` `   Past Surgical History (Tanisha A. Owens Shark, [place]; 08/22/2018 9:36 AM) Tonsillectomy Vasectomy Ventral / Umbilical Hernia Surgery Left.  Diagnostic Studies History (Tanisha A. Owens Shark, [place]; 08/22/2018 9:36 AM) Colonoscopy 1-5 years ago  Allergies (Tanisha A. Owens Shark, [place]; 08/22/2018 9:37 AM) No Known Drug Allergies [08/22/2018]: Allergies Reconciled  Medication History (Tanisha A. Owens Shark, [place]; 08/22/2018 9:38 AM) Eliquis (5MG  Tablet, Oral) Active. VESIcare (10MG  Tablet, Oral) Active. Omega-3-acid Ethyl Esters (1GM Capsule, Oral) Active. NexIUM (5MG  Packet, Oral) Active. Naproxen (Oral) Specific strength unknown - Active. Medications Reconciled  Social History (Tanisha A. Owens Shark, [place]; 08/22/2018 9:36 AM) No alcohol use No caffeine use No drug use Tobacco use Former smoker.  Family History (Tanisha A. Owens Shark, [place]; 08/22/2018 9:36 AM) Arthritis Mother. Respiratory Condition Father.  Other Problems (Tanisha A. Owens Shark, RMA; 08/22/2018 9:36 AM) Atrial Fibrillation Back Pain Bladder Problems Gastroesophageal Reflux Disease Umbilical Hernia Repair     Review of Systems (Tanisha A. Brown RMA; 08/22/2018 9:36 AM) General Present- Appetite Loss and Weight Loss. Not Present- Chills, Fatigue, Fever, Night Sweats and Weight Gain. Skin Not  Present- Change in Wart/Mole, Dryness, Hives,  Jaundice, New Lesions, Non-Healing Wounds, Rash and Ulcer. HEENT Present- Hearing Loss and Wears glasses/contact lenses. Not Present- Earache, Hoarseness, Nose Bleed, Oral Ulcers, Ringing in the Ears, Seasonal Allergies, Sinus Pain, Sore Throat, Visual Disturbances and Yellow Eyes. Respiratory Not Present- Bloody sputum, Chronic Cough, Difficulty Breathing, Snoring and Wheezing. Breast Not Present- Breast Mass, Breast Pain, Nipple Discharge and Skin Changes. Cardiovascular Not Present- Chest Pain, Difficulty Breathing Lying Down, Leg Cramps, Palpitations, Rapid Heart Rate, Shortness of Breath and Swelling of Extremities. Gastrointestinal Not Present- Abdominal Pain, Bloating, Bloody Stool, Change in Bowel Habits, Chronic diarrhea, Constipation, Difficulty Swallowing, Excessive gas, Gets full quickly at meals, Hemorrhoids, Indigestion, Nausea, Rectal Pain and Vomiting. Male Genitourinary Present- Blood in Urine. Not Present- Change in Urinary Stream, Frequency, Impotence, Nocturia, Painful Urination, Urgency and Urine Leakage. Musculoskeletal Present- Back Pain. Not Present- Joint Pain, Joint Stiffness, Muscle Pain, Muscle Weakness and Swelling of Extremities. Neurological Present- Trouble walking. Not Present- Decreased Memory, Fainting, Headaches, Numbness, Seizures, Tingling, Tremor and Weakness. Psychiatric Not Present- Anxiety, Bipolar, Change in Sleep Pattern, Depression, Fearful and Frequent crying. Endocrine Not Present- Cold Intolerance, Excessive Hunger, Hair Changes, Heat Intolerance, Hot flashes and New Diabetes. Hematology Present- Blood Thinners and Easy Bruising. Not Present- Excessive bleeding, Gland problems, HIV and Persistent Infections.  Vitals (Tanisha A. Brown RMA; 08/22/2018 9:37 AM) 08/22/2018 9:37 AM Weight: 187.4 lb Height: 66in Body Surface Area: 1.95 m Body Mass Index: 30.25 kg/m  Temp.: 98.65F  Pulse: 68 (Regular)   BP: 128/86 (Sitting, Left Arm, Standard)    BP (!) 144/69    Pulse 64    Temp 97.8 F (36.6 C) (Oral)    Resp 18    Ht 5\' 7"  (1.702 m)    Wt 84.4 kg    SpO2 93%    BMI 29.13 kg/m      Physical Exam Adin Hector MD; 08/22/2018 2:47 PM)  General Mental Status-Alert. General Appearance-Not in acute distress, Not Sickly. Orientation-Oriented X3. Hydration-Well hydrated. Voice-Normal.  Integumentary Global Assessment Upon inspection and palpation of skin surfaces of the - Axillae: non-tender, no inflammation or ulceration, no drainage. and Distribution of scalp and body hair is normal. General Characteristics Temperature - normal warmth is noted.  Head and Neck Head-normocephalic, atraumatic with no lesions or palpable masses. Face Global Assessment - atraumatic, no absence of expression. Neck Global Assessment - no abnormal movements, no bruit auscultated on the right, no bruit auscultated on the left, no decreased range of motion, non-tender. Trachea-midline. Thyroid Gland Characteristics - non-tender.  Eye Eyeball - Left-Extraocular movements intact, No Nystagmus - Left. Eyeball - Right-Extraocular movements intact, No Nystagmus - Right. Cornea - Left-No Hazy - Left. Cornea - Right-No Hazy - Right. Sclera/Conjunctiva - Left-No scleral icterus, No Discharge - Left. Sclera/Conjunctiva - Right-No scleral icterus, No Discharge - Right. Pupil - Left-Direct reaction to light normal. Pupil - Right-Direct reaction to light normal.  ENMT Ears Pinna - Left - no drainage observed, no generalized tenderness observed. Pinna - Right - no drainage observed, no generalized tenderness observed. Nose and Sinuses External Inspection of the Nose - no destructive lesion observed. Inspection of the nares - Left - quiet respiration. Inspection of the nares - Right - quiet respiration. Mouth and Throat Lips - Upper Lip - no fissures observed,  no pallor noted. Lower Lip - no fissures observed, no pallor noted. Nasopharynx - no discharge present. Oral Cavity/Oropharynx - Tongue - no dryness observed. Oral Mucosa - no cyanosis observed. Hypopharynx - no evidence of airway  distress observed. Note: Hard of hearing  Chest and Lung Exam Inspection Movements - Normal and Symmetrical. Accessory muscles - No use of accessory muscles in breathing. Palpation Palpation of the chest reveals - Non-tender. Auscultation Breath sounds - Normal and Clear.  Cardiovascular Auscultation Rhythm - Regular. Murmurs & Other Heart Sounds - Auscultation of the heart reveals - No Murmurs and No Systolic Clicks. Note: Subcutaneous mass in left upper chest consistent with pacemaker  Abdomen Inspection Inspection of the abdomen reveals - No Visible peristalsis and No Abnormal pulsations. Umbilicus - No Bleeding, No Urine drainage. Palpation/Percussion Palpation and Percussion of the abdomen reveal - Soft, Non Tender, No Rebound tenderness, No Rigidity (guarding) and No Cutaneous hyperesthesia. Note: Abdomen soft. Periumbilical bulging 6 x 5 cm reduces down to smaller supraumbilical defect. Consistent with supraumbilical hernia near prior incision.   Nontender. Not distended. No umbilical or incisional hernias. No guarding.  Male Genitourinary Sexual Maturity Tanner 5 - Adult hair pattern and Adult penile size and shape. Note: No inguinal hernias. Normal external genitalia. Epididymi, testes, and spermatic cords normal without any masses.  Peripheral Vascular Upper Extremity Inspection - Left - No Cyanotic nailbeds - Left, Not Ischemic. Inspection - Right - No Cyanotic nailbeds - Right, Not Ischemic.  Neurologic Neurologic evaluation reveals -normal attention span and ability to concentrate, able to name objects and repeat phrases. Appropriate fund of knowledge , normal sensation and normal coordination. Mental Status Affect - not  angry, not paranoid. Cranial Nerves-Normal Bilaterally. Gait-Normal.  Neuropsychiatric Mental status exam performed with findings of-able to articulate well with normal speech/language, rate, volume and coherence, thought content normal with ability to perform basic computations and apply abstract reasoning and no evidence of hallucinations, delusions, obsessions or homicidal/suicidal ideation.  Musculoskeletal Global Assessment Spine, Ribs and Pelvis - no instability, subluxation or laxity. Right Upper Extremity - no instability, subluxation or laxity.  Lymphatic Head & Neck  General Head & Neck Lymphatics: Bilateral - Description - No Localized lymphadenopathy. Axillary  General Axillary Region: Bilateral - Description - No Localized lymphadenopathy. Femoral & Inguinal  Generalized Femoral & Inguinal Lymphatics: Left - Description - No Localized lymphadenopathy. Right - Description - No Localized lymphadenopathy.    Assessment & Plan  INCISIONAL HERNIA, WITHOUT OBSTRUCTION OR GANGRENE (K43.2) Impression: Supraumbilical midline mass reducible consistent with incisional hernia near prior umbilical hernia repair.  Think that he would benefit from repair. Ideally underlay repair with mesh.  It is reasonable to coordinate this at the time of any planned kidney surgery. Patient has a large left kidney mass that is suspicious for tumor beginning worked up. Reasonable to plan hernia repair at the time of the nephrectomy. Could extract the kidney through the hernia and then repair with underlay mesh since I would think infection risk is not particularly high with a nephrectomy only. Most likely lap assisted. Dr. Gloriann Loan was planning a hand assist approach given the larger tumor & moderate hernia.  Patient had questions since he had numerous incisions.  Again I tried explained it would make sense to extract at the hernia and avoid multiple large fascial incisions   The  anatomy & physiology of the abdominal wall was discussed. The pathophysiology of hernias was discussed. Natural history risks without surgery including progeressive enlargement, pain, incarceration, & strangulation was discussed. Contributors to complications such as smoking, obesity, diabetes, prior surgery, etc were discussed.  I feel the risks of no intervention will lead to serious problems that outweigh the operative risks; therefore, I recommended surgery  to reduce and repair the hernia. I explained laparoscopic techniques with possible need for an open approach. I noted the probable use of mesh to patch and/or buttress the hernia repair  Risks such as bleeding, infection, abscess, need for further treatment, heart attack, death, and other risks were discussed. I noted a good likelihood this will help address the problem. Goals of post-operative recovery were discussed as well. Possibility that this will not correct all symptoms was explained. I stressed the importance of low-impact activity, aggressive pain control, avoiding constipation, & not pushing through pain to minimize risk of post-operative chronic pain or injury. Possibility of reherniation especially with smoking, obesity, diabetes, immunosuppression, and other health conditions was discussed. We will work to minimize complications.  An educational handout further explaining the pathology & treatment options was given as well. Questions were answered. The patient expresses understanding & wishes to proceed with surgery.  Pt Education - [facility] Hernia Post-Op HCI (Yehudit Fulginiti): discussed with patient and provided information. Pt Education - [facility] Pain Control (Haniel Fix) Pt Education - Pamphlet Given - Laparoscopic Hernia Repair: discussed with patient and provided information. Pt Education - [facility] Mesh education: discussed with patient and provided information.  ANTICOAGULATED (Z79.01) Impression: He is anticoagulated on Eliquis  for dysrhythmias. He has a pacemaker and is more stable. He is followed by Dr. Rayann Heman with Cardiology. We would want cardiac clearance before proceeding. He has been following the patient is aware of the workup.  Current Plans I recommended obtaining preoperative cardiac clearance.That was done.  Cleared for surgery  Adin Hector, MD, FACS, MASCRS Gastrointestinal and Minimally Invasive Surgery    [address], [place], [place] [address] [phone] Main / Paging [phone] Fax

## 2018-10-18 NOTE — Op Note (Signed)
10/18/2018  PATIENT:  [name]  78 y.o. male  Patient Care Team: [name], MD as PCP - General Nahser, Wonda Cheng, MD as PCP - Cardiology (Cardiology) Irene Shipper, MD as Consulting Physician (Gastroenterology) Michael Boston, MD as Consulting Physician (General Surgery) Thompson Grayer, MD as Consulting Physician (Cardiology) Lucas Mallow, MD as Consulting Physician (Urology) Tharon Aquas, MD as Resident (Urology)  PRE-OPERATIVE DIAGNOSIS:  INCISIONAL HERNIA  POST-OPERATIVE DIAGNOSIS:  INCISIONAL HERNIA  PROCEDURE:   LAPAROSCOPIC INCISIONAL HERNIA REPAIR WITH MESH TAP BLOCK - BILATERAL  SURGEON:  Adin Hector, MD  ASSISTANT:  Carlena Hurl, PA-C Tharon Aquas, MD, [facility] resident    ANESTHESIA:     General  Nerve block provided with liposomal bupivacaine (Experel) mixed with 4-mL  0.5% bupivacaine with epi & 61mL NS = 21mL total as a Bilateral TAP block x 71mL each side at the level of the transverse abdominis & preperitoneal spaces along the flank at the anterior axillary line, from subcostal ridge to iliac crest under laparoscopic guidance   EBL:  Total I/O In: 3500 [I.V.:3000; IV Piggyback:500] Out: 825 [Urine:325; Blood:500]  Per anesthesia record  Delay start of Pharmacological VTE agent (>24hrs) due to surgical blood loss or risk of bleeding:  no  DRAINS: 77 [place] Blake drain goes from left lower quadrant along left paracolic gutter and rests in the left upper quadrant retroperitoneum near the inferior pancreatic ridge and where the left kidney was.  SPECIMEN:  No Specimen  DISPOSITION OF SPECIMEN:  N/A  COUNTS:  YES  PLAN OF CARE: Admit for overnight observation  PATIENT DISPOSITION:  PACU - hemodynamically stable.  INDICATION: Pleasant patient has developed a ventral wall abdominal hernia.   Recommendation was made for surgical repair:   The anatomy & physiology of the abdominal wall was discussed. The pathophysiology of hernias was  discussed. Natural history risks without surgery including progeressive enlargement, pain, incarceration & strangulation was discussed. Contributors to complications such as smoking, obesity, diabetes, prior surgery, etc were discussed.  I feel the risks of no intervention will lead to serious problems that outweigh the operative risks; therefore, I recommended surgery to reduce and repair the hernia. I explained laparoscopic techniques with possible need for an open approach. I noted the probable use of mesh to patch and/or buttress the hernia repair.   Risks such as bleeding, infection, abscess, need for further treatment, heart attack, death, and other risks were discussed. I noted a good likelihood this will help address the problem. Goals of post-operative recovery were discussed as well. Possibility that this will not correct all symptoms was explained. I stressed the importance of low-impact activity, aggressive pain control, avoiding constipation, & not pushing through pain to minimize risk of post-operative chronic pain or injury. Possibility of reherniation especially with smoking, obesity, diabetes, immunosuppression, and other health conditions was discussed. We will work to minimize complications.  An educational handout further explaining the pathology & treatment options was given as well. Questions were answered. The patient expresses understanding & wishes to proceed with surgery.   OR FINDINGS: 8 x 4 cm supraumbilical incisional hernia.  Contained omentum.   Type of repair: Laparoscopic underlay repair   Placement of mesh: Intraperitoneal underlay repair  Name of mesh: Bard Ventralight dual sided (polypropylene / Seprafilm)  Size of mesh: 25x20cm  Orientation: Vertical  Mesh overlap:  5-7cm   DESCRIPTION:   The patient was in a right-side-down decubitus positioning and he just completed his left  radical nephrectomy and adrenalectomy by Dr. Gloriann Loan and Anthony Sar.  Please see their  dictation for details.  I was asked to do hand-assisted exploration.  Confirmed that the colon had been reflected away.  Can see the bed and confirmed staple line on the renal artery and vein stump on the left side close to the aorta.  I do not see any evidence of any pancreatic injury other abnormality.  Spleen intact and viable.  Decision made to most likely leave a drain in that region to be safe.  Carbon dioxide was evacuated.  Ports and wound protector were removed.  Charlie Pitter was placed over the abdomen.  The patient was carefully repositioned supine with arms tucked.  Ioban removed.  Patient re-prepped and draped in a sterile fashion.  Informed consent was confirmed.  I freed greater omentum off the inferior edge of the incisional hernia through the wound.  Patient had a greater omental moderate size defect.  Therefore I transected the greater omentum radially to have 2 separate tongues of omentum and avoid an internal hernia from the greater omentum.  I replaced the GelPort wound protector through his supraumbilical incisional hernia where the kidney had been extracted and where hand-assisted left-sided radical nephrectomy and occurred.  Placed a port within the GelPort and induced carbon dioxide insufflation.  Did camera inspection.  There was a mesenteric defect in the descending colon with some small bowel going into it.  I reduce that out.  Again confirmed left-sided colon viable.  Small bowel viable.  No major bleeding or hematoma.  No oozing.  Small bowel viable.     I could see the hernia on the parietal peritoneum under the abdominal wall.  I removed the GelPort wound protector and used towel clamps to help provide a seal of the incisional hernia.  I freed off the falciform ligament and central peritoneum to expose the retrorectus fascia   I made sure hemostasis was good.  I mapped out the region using a needle passer.   To ensure that I would have at least 5 cm radial coverage outside of the hernia  defect, I chose a 25x20cm dual sided mesh.  I placed #1 Prolene stitches around its edge about every 5 cm = 12 total.  I rolled the mesh & placed into the peritoneal cavity through the hernia defect.  I unrolled the mesh and positioned it appropriately.  I secured the mesh to cover up the hernia defect using a laparoscopic suture passer to pass the tails of the Prolene through the abdominal wall & tagged them with clamps for good transfascial suturing.  I started out in four corners to make sure I had the mesh centered under the hernia defect appropriately, and then proceeded to work in quadrants.  I use that to help cover and closed the fascia of the epigastric 10 mm port that had been placed by urology.  We evacuated CO2 & desufflated the abdomen.  I tied the fascial stitches down. I closed the hernia fascial defect that I placed the mesh through using #1 PDS running suture from each corner needing in the center.  I reinsufflated the abdomen. The mesh provided at least circumferential coverage around the entire region of hernia defects.   I secured the mesh centrally with an additional trans fascial stitch at the upper end of the hernia and at the lower end of the hernia x2 total.  Did these interrupted sutures at these corners in addition to in & out the mesh  using #1 PDS under laparoscopic visualization.   This help buttress the primary repair and centrally secure the mesh.  I tacked the edges & central part of the mesh to the peritoneum/posterior rectus fascia with SecureStrap absorbable tacks.   I did reinspection. Hemostasis was good. Mesh laid well. I completed a broad TAP block as noted as well  I did laparoscopic inspection.  Small bowel was out of the descending colon which is laying well.  Allowed greater omentum to lay over that area to help protect the mesenteric defect.  Because of his retroperitoneum laid well, I did not feel the need to be closed more aggressively.  Dr. Gloriann Loan had not felt it  was needed either.  I did have a drain placed through the left lower quadrant stapler port site.  I did use a 0 Vicryl suture to pass around that to tighten that port site fascial defect down using a laparoscopic suture passer.    Capnoperitoneum was evacuated. Ports were removed. The skin was closed with Monocryl at the port sites and Steri-Strips on the fascial stitch puncture sites.  Patient is being extubated to go to the recovery room.  I discussed operative findings, updated the patient's status, discussed probable steps to recovery, and gave postoperative recommendations to the patient's spouse., Leane Para.  Recommendations were made.  Questions were answered.  She expressed understanding & appreciation.  Adin Hector, M.D., F.A.C.S. Gastrointestinal and Minimally Invasive Surgery [facility] [address], [place], [place] [address] [phone] Main / Paging  10/18/2018 1:59 PM

## 2018-10-19 ENCOUNTER — Encounter (HOSPITAL_COMMUNITY): Payer: Self-pay | Admitting: Urology

## 2018-10-19 LAB — BASIC METABOLIC PANEL
Anion gap: 6 (ref 5–15)
BUN: 13 mg/dL (ref 8–23)
CO2: 22 mmol/L (ref 22–32)
Calcium: 8 mg/dL — ABNORMAL LOW (ref 8.9–10.3)
Chloride: 107 mmol/L (ref 98–111)
Creatinine, Ser: 1.08 mg/dL (ref 0.61–1.24)
GFR calc Af Amer: >60 mL/min (ref >60)
GFR calc non Af Amer: >60 mL/min (ref >60)
Glucose, Bld: 123 mg/dL — ABNORMAL HIGH (ref 70–99)
Potassium: 4.2 mmol/L (ref 3.5–5.1)
Sodium: 135 mmol/L (ref 135–145)

## 2018-10-19 LAB — HEMOGLOBIN AND HEMATOCRIT, BLOOD
HCT: 32.2 % — ABNORMAL LOW (ref 39.0–52.0)
Hemoglobin: 10.6 g/dL — ABNORMAL LOW (ref 13.0–17.0)

## 2018-10-19 LAB — AMYLASE, PLEURAL OR PERITONEAL FLUID: Amylase, Fluid: 1589 U/L

## 2018-10-19 LAB — AMYLASE: Amylase: 148 U/L — ABNORMAL HIGH (ref 28–100)

## 2018-10-19 MED ORDER — HEPARIN SODIUM (PORCINE) 5000 UNIT/ML IJ SOLN
5000.0000 [IU] | Freq: Three times a day (TID) | INTRAMUSCULAR | Status: DC
  Administered 2018-10-19 – 2018-10-22 (×10): 5000 [IU] via SUBCUTANEOUS
  Filled 2018-10-19 (×10): qty 1

## 2018-10-19 NOTE — Progress Notes (Signed)
Urology Progress Note   1 Day Post-Op status post left nephrectomy, adrenalectomy, no dissection.  Also had combined ventral hernia repair with mesh.  Subjective: NAEON.  Stood up with walker.  No flatus or bowel movement.  Pain controlled.  Wearing abdominal binder.  Objective: Vital signs in last 24 hours: Temp:  [95.8 F (35.4 C)-97.9 F (36.6 C)] 97.6 F (36.4 C) (08/20 0553) Pulse Rate:  [71-100] 97 (08/20 0553) Resp:  [12-20] 18 (08/20 0553) BP: (115-140)/(69-88) 117/69 (08/20 0553) SpO2:  [93 %-100 %] 97 % (08/20 0553) Arterial Line BP: (112-131)/(58-91) 129/60 (08/19 1445)  Intake/Output from previous day: 08/19 0701 - 08/20 0700 In: 5333.7 [I.V.:4533.7; IV Piggyback:800] Out: 2710 [Urine:1475; Drains:735; Blood:500] Intake/Output this shift: No intake/output data recorded.  Physical Exam:  General Appearance:  No acute distress.  Pulmonary: Normal respiratory effort on nasal cannula Cardiovascular: Regular rate Abdomen: Soft, appropriately tender.  Honeycomb midline dressing.  Binder in place.  Other incisions clean dry and intact with Dermabond Musculoskeletal: Normal gait. Extremities without edema. GU: Foley catheter, clear urine Neurologic:  No motor abnormalities noted.   Lab Results: Recent Labs    10/18/18 1258 10/18/18 1428 10/19/18 0454  HGB 10.9* 10.6* 10.6*  HCT 32.0* 32.5* 32.2*   BMET Recent Labs    10/18/18 1607 10/19/18 0454  NA 137 135  K 4.5 4.2  CL 106 107  CO2 22 22  GLUCOSE 187* 123*  BUN 14 13  CREATININE 1.11 1.08  CALCIUM 8.3* 8.0*     Studies/Results: No results found.  Assessment/Plan:  78 y.o. male s/p nephrectomy and ventral hernia repair.  Overall doing well post-op but had high drain output overnight.  Sanguinous on exam.  -Discontinue fluids -Discontinue Foley -Advance to soft diet -Start prophylactic heparin, continue to hold home anticoagulation - Monitor JP output.  If remains high will send for  amylase/lipase - Recheck Hgb tomorrow AM - Appreciate General surgery care - Stay today, possible d/c tomorrow  Dispo: Floor   LOS: 1 day   Tharon Aquas 10/19/2018, 7:20 AM

## 2018-10-19 NOTE — Progress Notes (Signed)
Frank [name] [id number] 07-28-1940  CARE TEAM:  PCP: [name], MD  Outpatient Care Team: Patient Care Team: [name], MD as PCP - General Nahser, Wonda Cheng, MD as PCP - Cardiology (Cardiology) Irene Shipper, MD as Consulting Physician (Gastroenterology) Michael Boston, MD as Consulting Physician (General Surgery) Thompson Grayer, MD as Consulting Physician (Cardiology) Lucas Mallow, MD as Consulting Physician (Urology) Tharon Aquas, MD as Resident (Urology)  Inpatient Treatment Team: Treatment Team: Attending Provider: Lucas Mallow, MD; Consulting Physician: Michael Boston, MD; Technician: Devin Going, NT; Technician: Alma Friendly, NT; Registered Nurse: Leona Carry, RN; Utilization Review: Darryll Capers, RN; Technician: Faith Rogue T, NT   Problem List:   Principal Problem:   Left renal mass Active Problems:   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   BPH (benign prostatic hyperplasia)   GERD (gastroesophageal reflux disease)   Hyperlipidemia with target LDL less than 70   History of anaphylaxis   Essential hypertension   Left adrenal mass (HCC)   Sick sinus syndrome (HCC)   Recurrent ventral incisional hernia   Renal mass   1 Day Post-Op  10/18/2018  Procedure(s): LEFT HAND ASSISTED LAPAROSCOPIC RADICAL NEPHRECTOMY WITH ADRENALECTOMY LAPAROSCOPIC ASSISTED VENTRAL WALL HERNIA REPAIR WITH MESH    Assessment  Recovering relatively well so far  [facility] Stay = 1 days)  Plan:  -Advance diet -Check serum and drain fluid for amylase.  If no elevated amylase and is low output, that argues against concern of possible pancreatic leak/fistula and drain can be removed.  Ultimately will defer to urology. -VTE prophylaxis- SCDs, etc -mobilize as tolerated to help recovery  20 minutes spent in review, evaluation, examination, counseling, and coordination of care.  More than 50% of that time was  spent in counseling.  Patient very appreciative of urology and my care.  Appeciative of nursing care.  I updated the patient's status to the patient and nurse.  Recommendations were made.  Questions were answered.  They expressed understanding & appreciation.   10/19/2018    Subjective: (Chief complaint)  Mild soreness in upper abdomen but well controlled.  Tolerating liquids.  Appreciative care.  Nurse in room.  Objective:  Vital signs:  Vitals:   10/18/18 1720 10/18/18 2033 10/19/18 0052 10/19/18 0553  BP: 138/88 137/86 115/73 117/69  Pulse: 76 87 100 97  Resp: 20 20 18 18   Temp: 97.8 F (36.6 C) 97.7 F (36.5 C) 97.9 F (36.6 C) 97.6 F (36.4 C)  TempSrc: Oral Oral Oral Oral  SpO2: 100% 96% 95% 97%  Weight:      Height:        Last BM Date: 10/18/18  Intake/Output   Yesterday:  08/19 0701 - 08/20 0700 In: 5333.7 [I.V.:4533.7; IV Piggyback:800] Out: 2710 [DJTTS:1779; Drains:735; Blood:500] This shift:  No intake/output data recorded.  Bowel function:  Flatus: YES  BM:  No  Drain: Serosanguinous   Physical Exam:  General: Pt awake/alert/oriented x4 in no acute distress Eyes: PERRL, normal EOM.  Sclera clear.  No icterus Neuro: CN II-XII intact w/o focal sensory/motor deficits. Lymph: No head/neck/groin lymphadenopathy Psych:  No delerium/psychosis/paranoia HENT: Normocephalic, Mucus membranes moist.  No thrush.  Is hard of hearing. Neck: Supple, No tracheal deviation Chest: No chest wall pain w good excursion CV:  Pulses intact.  Regular rhythm MS: Normal AROM mjr joints.  No obvious deformity  Abdomen: Soft.  Mildy distended.  Mildly tender at incisions only.  No evidence of peritonitis.  No incarcerated hernias.  Ext:  No deformity.  No mjr edema.  No cyanosis Skin: No petechiae / purpura  Results:   Cultures: Recent Results (from the past 720 hour(s))  SARS CORONAVIRUS 2 Nasal Swab Aptima Multi Swab     Status: None   Collection Time:  10/14/18 11:31 AM   Specimen: Aptima Multi Swab; Nasal Swab  Result Value Ref Range Status   SARS Coronavirus 2 NEGATIVE NEGATIVE Final    Comment: (NOTE) SARS-CoV-2 target nucleic acids are NOT DETECTED. The SARS-CoV-2 RNA is generally detectable in upper and lower respiratory specimens during the acute phase of infection. Negative results do not preclude SARS-CoV-2 infection, do not rule out co-infections with other pathogens, and should not be used as the sole basis for treatment or other patient management decisions. Negative results must be combined with clinical observations, patient history, and epidemiological information. The expected result is Negative. Fact Sheet for Patients: SugarRoll.be Fact Sheet for Healthcare Providers: https://www.woods-mathews.com/ This test is not yet approved or cleared by the [place] FDA and  has been authorized for detection and/or diagnosis of SARS-CoV-2 by FDA under an Emergency Use Authorization (EUA). This EUA will remain  in effect (meaning this test can be used) for the duration of the COVID-19 declaration under Section 56 4(b)(1) of the Act, 21 U.S.C. section 360bbb-3(b)(1), unless the authorization is terminated or revoked sooner. Performed at [facility], [place] [address], [place], [place] [address]     Labs: Results for orders placed or performed during the hospital encounter of 10/18/18 (from the past 48 hour(s))  Type and screen [place]     Status: None   Collection Time: 10/18/18  7:15 AM  Result Value Ref Range   ABO/RH(D) A POS    Antibody Screen NEG    Sample Expiration      10/21/2018,2359 Performed at [facility], [place] [address], [place], [place] [address]   ABO/Rh     Status: None   Collection Time: 10/18/18  7:15 AM  Result Value Ref Range   ABO/RH(D)      A POS Performed at [facility], [place]  [address], [place], [place] [address]   CBC     Status: Abnormal   Collection Time: 10/18/18 11:50 AM  Result Value Ref Range   WBC 17.6 (H) 4.0 - 10.5 K/uL   RBC 3.94 (L) 4.22 - 5.81 MIL/uL   Hemoglobin 12.3 (L) 13.0 - 17.0 g/dL   HCT 37.3 (L) 39.0 - 52.0 %   MCV 94.7 80.0 - 100.0 fL   MCH 31.2 26.0 - 34.0 pg   MCHC 33.0 30.0 - 36.0 g/dL   RDW 13.7 11.5 - 15.5 %   Platelets 172 150 - 400 K/uL   nRBC 0.0 0.0 - 0.2 %    Comment: Performed at [facility], [place] [address] [place], [place] [address]  I-STAT 7, (LYTES, BLD GAS, ICA, H+H)     Status: Abnormal   Collection Time: 10/18/18 11:58 AM  Result Value Ref Range   pH, Arterial 7.207 (L) 7.350 - 7.450   pCO2 arterial 55.6 (H) 32.0 - 48.0 mmHg   pO2, Arterial 156.0 (H) 83.0 - 108.0 mmHg   Bicarbonate 22.0 20.0 - 28.0 mmol/L   TCO2 24 22 - 32 mmol/L   O2 Saturation 99.0 %   Acid-base deficit 6.0 (H) 0.0 - 2.0 mmol/L   Sodium 138 135 -  145 mmol/L   Potassium 4.4 3.5 - 5.1 mmol/L   Calcium, Ion 1.24 1.15 - 1.40 mmol/L   HCT 36.0 (L) 39.0 - 52.0 %   Hemoglobin 12.2 (L) 13.0 - 17.0 g/dL   Patient temperature HIDE    Sample type ARTERIAL   Blood gas, arterial     Status: Abnormal   Collection Time: 10/18/18 12:15 PM  Result Value Ref Range   Delivery systems VENTILATOR     Comment: VENTILATOR IN OR   pH, Arterial 7.197 (LL) 7.350 - 7.450    Comment: CRITICAL RESULT CALLED TO, READ BACK BY AND VERIFIED WITH: STUBBLEFIELD,M BY SURGERY ON 10/18/18 AT 1210    pCO2 arterial 55.5 (H) 32.0 - 48.0 mmHg   pO2, Arterial 163 (H) 83.0 - 108.0 mmHg   Bicarbonate 20.8 20.0 - 28.0 mmol/L   Acid-base deficit 7.8 (H) 0.0 - 2.0 mmol/L   O2 Saturation 98.4 %   Patient temperature 98.6    Collection site RIGHT RADIAL    Drawn by DRAWN BY RN    Sample type ARTERIAL DRAW     Comment: Performed at [facility], [place] [address] [place], [place] [address]  I-STAT 7, (LYTES, BLD GAS, ICA, H+H)     Status:  Abnormal   Collection Time: 10/18/18 12:58 PM  Result Value Ref Range   pH, Arterial 7.261 (L) 7.350 - 7.450   pCO2 arterial 45.0 32.0 - 48.0 mmHg   pO2, Arterial 123.0 (H) 83.0 - 108.0 mmHg   Bicarbonate 20.2 20.0 - 28.0 mmol/L   TCO2 22 22 - 32 mmol/L   O2 Saturation 98.0 %   Acid-base deficit 7.0 (H) 0.0 - 2.0 mmol/L   Sodium 138 135 - 145 mmol/L   Potassium 4.6 3.5 - 5.1 mmol/L   Calcium, Ion 1.20 1.15 - 1.40 mmol/L   HCT 32.0 (L) 39.0 - 52.0 %   Hemoglobin 10.9 (L) 13.0 - 17.0 g/dL   Patient temperature HIDE    Sample type ARTERIAL   Basic metabolic panel     Status: Abnormal   Collection Time: 10/18/18  2:28 PM  Result Value Ref Range   Sodium 137 135 - 145 mmol/L   Potassium 3.8 3.5 - 5.1 mmol/L    Comment: DELTA CHECK NOTED   Chloride 108 98 - 111 mmol/L   CO2 17 (L) 22 - 32 mmol/L   Glucose, Bld 204 (H) 70 - 99 mg/dL   BUN 13 8 - 23 mg/dL   Creatinine, Ser 1.04 0.61 - 1.24 mg/dL   Calcium 7.8 (L) 8.9 - 10.3 mg/dL   GFR calc non Af Amer >60 >60 mL/min   GFR calc Af Amer >60 >60 mL/min   Anion gap 12 5 - 15    Comment: Performed at [facility], [place] [address], [place], [place] [address]  Hemoglobin and hematocrit, blood     Status: Abnormal   Collection Time: 10/18/18  2:28 PM  Result Value Ref Range   Hemoglobin 10.6 (L) 13.0 - 17.0 g/dL   HCT 32.5 (L) 39.0 - 52.0 %    Comment: Performed at [facility], [place] [address] [place], [place] [address]  Basic metabolic panel     Status: Abnormal   Collection Time: 10/18/18  4:07 PM  Result Value Ref Range   Sodium 137 135 - 145 mmol/L   Potassium 4.5 3.5 - 5.1 mmol/L   Chloride 106 98 - 111 mmol/L   CO2 22 22 -  32 mmol/L   Glucose, Bld 187 (H) 70 - 99 mg/dL   BUN 14 8 - 23 mg/dL   Creatinine, Ser 1.11 0.61 - 1.24 mg/dL   Calcium 8.3 (L) 8.9 - 10.3 mg/dL   GFR calc non Af Amer >60 >60 mL/min   GFR calc Af Amer >60 >60 mL/min   Anion gap 9 5 - 15    Comment: Performed at  [facility], [place] [address] [address] [place], [place] [address]  Basic metabolic panel     Status: Abnormal   Collection Time: 10/19/18  4:54 AM  Result Value Ref Range   Sodium 135 135 - 145 mmol/L   Potassium 4.2 3.5 - 5.1 mmol/L   Chloride 107 98 - 111 mmol/L   CO2 22 22 - 32 mmol/L   Glucose, Bld 123 (H) 70 - 99 mg/dL   BUN 13 8 - 23 mg/dL   Creatinine, Ser 1.08 0.61 - 1.24 mg/dL   Calcium 8.0 (L) 8.9 - 10.3 mg/dL   GFR calc non Af Amer >60 >60 mL/min   GFR calc Af Amer >60 >60 mL/min   Anion gap 6 5 - 15    Comment: Performed at [facility], [place] [address] [place], [place] [address]  Hemoglobin and hematocrit, blood     Status: Abnormal   Collection Time: 10/19/18  4:54 AM  Result Value Ref Range   Hemoglobin 10.6 (L) 13.0 - 17.0 g/dL   HCT 32.2 (L) 39.0 - 52.0 %    Comment: Performed at [facility], [place] [address] [place], [place] [address]    Imaging / Studies: No results found.  Medications / Allergies: per chart  Antibiotics: Anti-infectives (From admission, onward)   Start     Dose/Rate Route Frequency Ordered Stop   10/18/18 2200  ceFAZolin (ANCEF) IVPB 1 g/50 mL premix  Status:  Discontinued     1 g 100 mL/hr over 30 Minutes Intravenous Every 8 hours 10/18/18 1543 10/18/18 1555   10/18/18 2200  ceFAZolin (ANCEF) IVPB 2g/100 mL premix     2 g 200 mL/hr over 30 Minutes Intravenous Every 8 hours 10/18/18 1543 10/19/18 0640   10/18/18 1401  ceFAZolin (ANCEF) 2-4 GM/100ML-% IVPB    Note to Pharmacy: Key, Kristopher   : cabinet override      10/18/18 1401 10/18/18 1546   10/18/18 0645  ceFAZolin (ANCEF) IVPB 2g/100 mL premix  Status:  Discontinued     2 g 200 mL/hr over 30 Minutes Intravenous On call to O.R. 10/18/18 4656 10/18/18 8127   10/18/18 0636  ceFAZolin (ANCEF) IVPB 2g/100 mL premix     2 g 200 mL/hr over 30 Minutes Intravenous 30 min pre-op 10/18/18 5170 10/18/18 1403        Note: Portions of  this report may have been transcribed using voice recognition software. Every effort was made to ensure accuracy; however, inadvertent computerized transcription errors may be present.   Any transcriptional errors that result from this process are unintentional.     Adin Hector, MD, FACS, MASCRS Gastrointestinal and Minimally Invasive Surgery    [address] [place], [place] [address] [phone] Main / Paging [phone] Fax

## 2018-10-19 NOTE — Progress Notes (Signed)
Pt encouraged to ambulate several times during shift. However, pt continues to refuse and states he needs his rest. This morning pt agreed to dangle at the edge of the bed and was able to stand with assistance for a few minutes. Education about the importance of ambulation reinforced. Incentive spirometer given to pt with the use of teach back. Will continue to monitor.

## 2018-10-19 NOTE — Plan of Care (Signed)
  Problem: Education: Goal: Knowledge of General Education information will improve Description: Including pain rating scale, medication(s)/side effects and non-pharmacologic comfort measures Outcome: Completed/Met   Problem: Clinical Measurements: Goal: Ability to maintain clinical measurements within normal limits will improve Outcome: Progressing Goal: Will remain free from infection Outcome: Progressing Goal: Diagnostic test results will improve Outcome: Progressing Goal: Respiratory complications will improve Outcome: Progressing Goal: Cardiovascular complication will be avoided Outcome: Progressing   Problem: Activity: Goal: Risk for activity intolerance will decrease Outcome: Progressing   Problem: Nutrition: Goal: Adequate nutrition will be maintained Outcome: Progressing   Problem: Coping: Goal: Level of anxiety will decrease Outcome: Progressing   Problem: Elimination: Goal: Will not experience complications related to bowel motility Outcome: Progressing Goal: Will not experience complications related to urinary retention Outcome: Progressing   Problem: Pain Managment: Goal: General experience of comfort will improve Outcome: Progressing   Problem: Safety: Goal: Ability to remain free from injury will improve Outcome: Progressing   Problem: Skin Integrity: Goal: Risk for impaired skin integrity will decrease Outcome: Progressing   Problem: Education: Goal: Required Educational Video(s) Outcome: Completed/Met   Problem: Clinical Measurements: Goal: Ability to maintain clinical measurements within normal limits will improve Outcome: Progressing Goal: Postoperative complications will be avoided or minimized Outcome: Progressing   Problem: Skin Integrity: Goal: Demonstration of wound healing without infection will improve Outcome: Progressing   Problem: Education: Goal: Knowledge of the prescribed therapeutic regimen will improve Outcome:  Completed/Met   Problem: Bowel/Gastric: Goal: Gastrointestinal status for postoperative course will improve Outcome: Progressing   Problem: Clinical Measurements: Goal: Postoperative complications will be avoided or minimized Outcome: Progressing   Problem: Respiratory: Goal: Ability to achieve and maintain a regular respiratory rate will improve Outcome: Progressing   Problem: Skin Integrity: Goal: Demonstration of wound healing without infection will improve Outcome: Progressing   Problem: Urinary Elimination: Goal: Ability to avoid or minimize complications of infection will improve Outcome: Progressing   Problem: Urinary Elimination: Goal: Ability to achieve and maintain urine output will improve Outcome: Progressing

## 2018-10-20 LAB — BASIC METABOLIC PANEL WITH GFR
Anion gap: 7 (ref 5–15)
BUN: 13 mg/dL (ref 8–23)
CO2: 24 mmol/L (ref 22–32)
Calcium: 8.5 mg/dL — ABNORMAL LOW (ref 8.9–10.3)
Chloride: 106 mmol/L (ref 98–111)
Creatinine, Ser: 1.16 mg/dL (ref 0.61–1.24)
GFR calc Af Amer: >60 mL/min
GFR calc non Af Amer: >60 mL/min
Glucose, Bld: 127 mg/dL — ABNORMAL HIGH (ref 70–99)
Potassium: 3.8 mmol/L (ref 3.5–5.1)
Sodium: 137 mmol/L (ref 135–145)

## 2018-10-20 LAB — HEMOGLOBIN AND HEMATOCRIT, BLOOD
HCT: 29.8 % — ABNORMAL LOW (ref 39.0–52.0)
Hemoglobin: 9.8 g/dL — ABNORMAL LOW (ref 13.0–17.0)

## 2018-10-20 MED ORDER — OXYCODONE HCL 5 MG PO TABS: 5.0000 mg | ORAL_TABLET | ORAL | 0 refills | Status: DC | PRN

## 2018-10-20 NOTE — Progress Notes (Signed)
Frank [name] [id number] 10-02-1940  CARE TEAM:  PCP: [name], MD  Outpatient Care Team: Patient Care Team: [name], MD as PCP - General Nahser, Wonda Cheng, MD as PCP - Cardiology (Cardiology) Irene Shipper, MD as Consulting Physician (Gastroenterology) Michael Boston, MD as Consulting Physician (General Surgery) Thompson Grayer, MD as Consulting Physician (Cardiology) Lucas Mallow, MD as Consulting Physician (Urology) Tharon Aquas, MD as Resident (Urology)  Inpatient Treatment Team: Treatment Team: Attending Provider: Lucas Mallow, MD; Consulting Physician: Michael Boston, MD; Technician: Devin Going, NT; Technician: Alma Friendly, NT; Technician: Erline Hau, [place]; Registered Nurse: Con Memos, RN; Utilization Review: Darryll Capers, RN; Technician: Kizzie Furnish, NT; Case Manager: Dessa Phi, RN   Problem List:   Principal Problem:   Left renal mass Active Problems:   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   BPH (benign prostatic hyperplasia)   GERD (gastroesophageal reflux disease)   Hyperlipidemia with target LDL less than 70   History of anaphylaxis   Essential hypertension   Left adrenal mass (HCC)   Sick sinus syndrome (HCC)   Recurrent ventral incisional hernia   Renal mass   2 Days Post-Op  10/18/2018  Procedure(s): LEFT HAND ASSISTED LAPAROSCOPIC RADICAL NEPHRECTOMY WITH ADRENALECTOMY LAPAROSCOPIC ASSISTED VENTRAL WALL HERNIA REPAIR WITH MESH    Assessment  Recovering relatively well so far  [facility] Stay = 2 days)  Plan:  -Advance diet  -Elevated drain amylase suspicious for pancreatic leak.  Output already going down.  Hopefully is small and low volume.  Would keep drain for now.  Defer to urology, but most likely can remove if output is low and follow-up drain amylase normalizes.  Wean off oxygen as tolerated for hypoxia.  -VTE prophylaxis- SCDs, etc   -mobilize as tolerated to help recovery  General surgery available for questions this weekend.  Can discharge from general surgery standpoint but will defer to urology.  30 minutes spent in review, evaluation, examination, counseling, and coordination of care.  More than 50% of that time was spent in counseling.  Patient very appreciative of urology and my care.  Appeciative of nursing care.  I updated the patient's status to the patient and spouse.  Recommendations were made.  Questions were answered.  They expressed understanding & appreciation.   10/20/2018    Subjective: (Chief complaint)  Wife in room.  Patient with some soreness but feeling better overall.  Tolerating food.  Objective:  Vital signs:  Vitals:   10/19/18 1309 10/19/18 2115 10/20/18 0507 10/20/18 1307  BP: 127/68 110/66 117/72 121/65  Pulse: 99 96 90 83  Resp: 19 18 20 18   Temp: 97.6 F (36.4 C) 98.3 F (36.8 C) 98.5 F (36.9 C) 98.3 F (36.8 C)  TempSrc: Oral Oral Oral Oral  SpO2: (!) 86% 93% 93% 93%  Weight:      Height:        Last BM Date: 10/18/18  Intake/Output   Yesterday:  08/20 0701 - 08/21 0700 In: 1728.4 [P.O.:1500; I.V.:128.4; IV Piggyback:100] Out: 2210 [Urine:2000; Drains:210] This shift:  Total I/O In: 69 [P.O.:60] Out: 420 [Urine:400; Drains:20]  Bowel function:  Flatus: YES  BM:  No  Drain: Serosanguinous   Physical Exam:  General: Pt awake/alert/oriented x4 in no acute distress Eyes: PERRL, normal EOM.  Sclera clear.  No icterus Neuro: CN II-XII intact w/o focal sensory/motor deficits. Lymph: No head/neck/groin lymphadenopathy Psych:  No delerium/psychosis/paranoia HENT:  Normocephalic, Mucus membranes moist.  No thrush.  Is hard of hearing. Neck: Supple, No tracheal deviation Chest: No chest wall pain w good excursion CV:  Pulses intact.  Regular rhythm MS: Normal AROM mjr joints.  No obvious deformity  Abdomen: Soft.  Nondistended.  Mildly tender at  incisions only.  No evidence of peritonitis.  No incarcerated hernias.  Ext:  No deformity.  No mjr edema.  No cyanosis Skin: No petechiae / purpura  Results:   Cultures: Recent Results (from the past 720 hour(s))  SARS CORONAVIRUS 2 Nasal Swab Aptima Multi Swab     Status: None   Collection Time: 10/14/18 11:31 AM   Specimen: Aptima Multi Swab; Nasal Swab  Result Value Ref Range Status   SARS Coronavirus 2 NEGATIVE NEGATIVE Final    Comment: (NOTE) SARS-CoV-2 target nucleic acids are NOT DETECTED. The SARS-CoV-2 RNA is generally detectable in upper and lower respiratory specimens during the acute phase of infection. Negative results do not preclude SARS-CoV-2 infection, do not rule out co-infections with other pathogens, and should not be used as the sole basis for treatment or other patient management decisions. Negative results must be combined with clinical observations, patient history, and epidemiological information. The expected result is Negative. Fact Sheet for Patients: SugarRoll.be Fact Sheet for Healthcare Providers: https://www.woods-mathews.com/ This test is not yet approved or cleared by the [place] FDA and  has been authorized for detection and/or diagnosis of SARS-CoV-2 by FDA under an Emergency Use Authorization (EUA). This EUA will remain  in effect (meaning this test can be used) for the duration of the COVID-19 declaration under Section 56 4(b)(1) of the Act, 21 U.S.C. section 360bbb-3(b)(1), unless the authorization is terminated or revoked sooner. Performed at [facility], [place] [address], [place], [place] [address]     Labs: Results for orders placed or performed during the hospital encounter of 10/18/18 (from the past 48 hour(s))  Basic metabolic panel     Status: Abnormal   Collection Time: 10/18/18  2:28 PM  Result Value Ref Range   Sodium 137 135 - 145 mmol/L   Potassium 3.8 3.5 - 5.1  mmol/L    Comment: DELTA CHECK NOTED   Chloride 108 98 - 111 mmol/L   CO2 17 (L) 22 - 32 mmol/L   Glucose, Bld 204 (H) 70 - 99 mg/dL   BUN 13 8 - 23 mg/dL   Creatinine, Ser 1.04 0.61 - 1.24 mg/dL   Calcium 7.8 (L) 8.9 - 10.3 mg/dL   GFR calc non Af Amer >60 >60 mL/min   GFR calc Af Amer >60 >60 mL/min   Anion gap 12 5 - 15    Comment: Performed at [facility], [place] [address], [place], [place] [address]  Hemoglobin and hematocrit, blood     Status: Abnormal   Collection Time: 10/18/18  2:28 PM  Result Value Ref Range   Hemoglobin 10.6 (L) 13.0 - 17.0 g/dL   HCT 32.5 (L) 39.0 - 52.0 %    Comment: Performed at [facility], [place] [address], [place], [place] [address]  Basic metabolic panel     Status: Abnormal   Collection Time: 10/18/18  4:07 PM  Result Value Ref Range   Sodium 137 135 - 145 mmol/L   Potassium 4.5 3.5 - 5.1 mmol/L   Chloride 106 98 - 111 mmol/L   CO2 22 22 - 32 mmol/L   Glucose, Bld 187 (H) 70 - 99 mg/dL  BUN 14 8 - 23 mg/dL   Creatinine, Ser 1.11 0.61 - 1.24 mg/dL   Calcium 8.3 (L) 8.9 - 10.3 mg/dL   GFR calc non Af Amer >60 >60 mL/min   GFR calc Af Amer >60 >60 mL/min   Anion gap 9 5 - 15    Comment: Performed at [facility], [place] [address] [place], [place] [address]  Basic metabolic panel     Status: Abnormal   Collection Time: 10/19/18  4:54 AM  Result Value Ref Range   Sodium 135 135 - 145 mmol/L   Potassium 4.2 3.5 - 5.1 mmol/L   Chloride 107 98 - 111 mmol/L   CO2 22 22 - 32 mmol/L   Glucose, Bld 123 (H) 70 - 99 mg/dL   BUN 13 8 - 23 mg/dL   Creatinine, Ser 1.08 0.61 - 1.24 mg/dL   Calcium 8.0 (L) 8.9 - 10.3 mg/dL   GFR calc non Af Amer >60 >60 mL/min   GFR calc Af Amer >60 >60 mL/min   Anion gap 6 5 - 15    Comment: Performed at [facility], [place] [address] [place], [place] [address]  Hemoglobin and hematocrit, blood     Status: Abnormal   Collection Time: 10/19/18   4:54 AM  Result Value Ref Range   Hemoglobin 10.6 (L) 13.0 - 17.0 g/dL   HCT 32.2 (L) 39.0 - 52.0 %    Comment: Performed at [facility], [place] [address] [place], [place] [address]  Amylase     Status: Abnormal   Collection Time: 10/19/18  4:54 AM  Result Value Ref Range   Amylase 148 (H) 28 - 100 U/L    Comment: Performed at [facility], [place] [address] [place], [place] [address]  Amylase, pleural or peritoneal fluid     Status: None   Collection Time: 10/19/18  8:17 AM  Result Value Ref Range   Amylase, Fluid 1,589 U/L    Comment: NO NORMAL RANGE ESTABLISHED FOR THIS TEST Performed at [facility], [address], [place], [place] [address]    Fluid Type-FAMY Peritoneal     Comment: Performed at [place] [address], [place], [place] [address]  Hemoglobin and hematocrit, blood     Status: Abnormal   Collection Time: 10/20/18  4:47 AM  Result Value Ref Range   Hemoglobin 9.8 (L) 13.0 - 17.0 g/dL   HCT 29.8 (L) 39.0 - 52.0 %    Comment: Performed at [facility], [place] [address] [place], [place] [address]  Basic metabolic panel     Status: Abnormal   Collection Time: 10/20/18  4:47 AM  Result Value Ref Range   Sodium 137 135 - 145 mmol/L   Potassium 3.8 3.5 - 5.1 mmol/L   Chloride 106 98 - 111 mmol/L   CO2 24 22 - 32 mmol/L   Glucose, Bld 127 (H) 70 - 99 mg/dL   BUN 13 8 - 23 mg/dL   Creatinine, Ser 1.16 0.61 - 1.24 mg/dL   Calcium 8.5 (L) 8.9 - 10.3 mg/dL   GFR calc non Af Amer >60 >60 mL/min   GFR calc Af Amer >60 >60 mL/min   Anion gap 7 5 - 15    Comment: Performed at [facility], [place] [address], [place], [place] [address]    Imaging / Studies: No results found.  Medications / Allergies: per chart  Antibiotics: Anti-infectives (  From admission, onward)   Start     Dose/Rate Route Frequency Ordered Stop   10/18/18 2200  ceFAZolin (ANCEF) IVPB 1  g/50 mL premix  Status:  Discontinued     1 g 100 mL/hr over 30 Minutes Intravenous Every 8 hours 10/18/18 1543 10/18/18 1555   10/18/18 2200  ceFAZolin (ANCEF) IVPB 2g/100 mL premix     2 g 200 mL/hr over 30 Minutes Intravenous Every 8 hours 10/18/18 1543 10/19/18 1412   10/18/18 1401  ceFAZolin (ANCEF) 2-4 GM/100ML-% IVPB    Note to Pharmacy: Key, Kristopher   : cabinet override      10/18/18 1401 10/18/18 1546   10/18/18 0645  ceFAZolin (ANCEF) IVPB 2g/100 mL premix  Status:  Discontinued     2 g 200 mL/hr over 30 Minutes Intravenous On call to O.R. 10/18/18 1062 10/18/18 6948   10/18/18 0636  ceFAZolin (ANCEF) IVPB 2g/100 mL premix     2 g 200 mL/hr over 30 Minutes Intravenous 30 min pre-op 10/18/18 5462 10/18/18 1403        Note: Portions of this report may have been transcribed using voice recognition software. Every effort was made to ensure accuracy; however, inadvertent computerized transcription errors may be present.   Any transcriptional errors that result from this process are unintentional.     Adin Hector, MD, FACS, MASCRS Gastrointestinal and Minimally Invasive Surgery    [address] [place], [place] [address] [phone] Main / Paging [phone] Fax

## 2018-10-20 NOTE — Care Management Important Message (Signed)
Important Message  Patient Details IM Letter given to Dessa Phi RN to present to the Patient Name: [name] MRN: [id number] Date of Birth: 1940/07/02   Medicare Important Message Given:  Yes     Kerin Salen 10/20/2018, 11:31 AM

## 2018-10-20 NOTE — Discharge Instructions (Addendum)
·   You may restart Eliquis on Monday  Activity:  You are encouraged to ambulate frequently (about every hour during waking hours) to help prevent blood clots from forming in your legs or lungs.  However, you should not engage in any heavy lifting (> 10-15 lbs), strenuous activity, or straining.   Diet: You should advance your diet as instructed by your physician.  It will be normal to have some bloating, nausea, and abdominal discomfort intermittently.   Prescriptions:  You will be provided a prescription for pain medication to take as needed.  If your pain is not severe enough to require the prescription pain medication, you may take extra strength Tylenol instead which will have less side effects.  You should also take a prescribed stool softener to avoid straining with bowel movements as the prescription pain medication may constipate you.   Incisions: You may remove your dressing bandages 48 hours after surgery if not removed in the hospital.  You will either have some small staples or special tissue glue at each of the incision sites. Once the bandages are removed (if present), the incisions may stay open to air.  You may start showering (but not soaking or bathing in water) the 2nd day after surgery and the incisions simply need to be patted dry after the shower.  No additional care is needed.   What to call us about: You should call the office [phone]) if you develop fever > 101 or develop persistent vomiting. Activity:  You are encouraged to ambulate frequently (about every hour during waking hours) to help prevent blood clots from forming in your legs or lungs.  However, you should not engage in any heavy lifting (> 10-15 lbs), strenuous activity, or straining.

## 2018-10-20 NOTE — Progress Notes (Signed)
Urology Progress Note   2 Days Post-Op status post left nephrectomy, adrenalectomy, no dissection.  Also had combined ventral hernia repair with mesh.  Postoperative concern for pancreatic leak with high drain amylase.  Subjective: Had a good day yesterday.  Ambulated multiple times.  Tolerated p.o.  No nausea or vomiting.  No gas or bowel movement, endorses GERD.    Drain amylase > 1500.  Discussed drain management with patient and his wife.  Wearing binder.  Pain controlled.  210of drain output over the last 24 hours  Objective: Vital signs in last 24 hours: Temp:  [97.6 F (36.4 C)-98.5 F (36.9 C)] 98.5 F (36.9 C) (08/21 0507) Pulse Rate:  [87-99] 90 (08/21 0507) Resp:  [16-20] 20 (08/21 0507) BP: (108-127)/(66-72) 117/72 (08/21 0507) SpO2:  [86 %-93 %] 93 % (08/21 0507)  Intake/Output from previous day: 08/20 0701 - 08/21 0700 In: 1728.4 [P.O.:1500; I.V.:128.4; IV Piggyback:100] Out: 7867 [Urine:2000; Drains:210] Intake/Output this shift: No intake/output data recorded.  Physical Exam:  General Appearance:  No acute distress.  Pulmonary: Normal respiratory effort on nasal cannula Cardiovascular: Regular rate Abdomen: Soft, appropriately tender.  Honeycomb midline dressing.  Binder in place.  Other incisions clean dry and intact with Dermabond.  Drain sanguinous. Musculoskeletal: Normal gait. Extremities without edema. GU: Voiding spontaneously Neurologic:  No motor abnormalities noted.   Lab Results: Recent Labs    10/18/18 1428 10/19/18 0454 10/20/18 0447  HGB 10.6* 10.6* 9.8*  HCT 32.5* 32.2* 29.8*   BMET Recent Labs    10/19/18 0454 10/20/18 0447  NA 135 137  K 4.2 3.8  CL 107 106  CO2 22 24  GLUCOSE 123* 127*  BUN 13 13  CREATININE 1.08 1.16  CALCIUM 8.0* 8.5*     Studies/Results: No results found.  Assessment/Plan:  78 y.o. male s/p nephrectomy and ventral hernia repair.  Overall doing well post-op but drain amylase concern for pancreatic  leak.  At this time will manage conservatively with heart healthy diet and monitoring drain.  -Heart healthy diet -Continue prophylactic heparin, continue to hold home anticoagulation -Keep drain in place and monitor output -Hemoglobin and BMP within normal limits this morning.  [place] surgery care -Continue to monitor, possible discharge sometime in the next 3 days  Dispo: Floor   LOS: 2 days   Tharon Aquas 10/20/2018, 7:26 AM

## 2018-10-21 LAB — HEMOGLOBIN AND HEMATOCRIT, BLOOD
HCT: 26.1 % — ABNORMAL LOW (ref 39.0–52.0)
Hemoglobin: 8.5 g/dL — ABNORMAL LOW (ref 13.0–17.0)

## 2018-10-21 LAB — AMYLASE, PLEURAL OR PERITONEAL FLUID: Amylase, Fluid: 129 U/L

## 2018-10-21 NOTE — Progress Notes (Signed)
O2 sat 93% on 2 liters, removed oxygen and rechecked O2 88% on room air.  Replaced oxygen at 1 liter, up to 92%.

## 2018-10-21 NOTE — Progress Notes (Signed)
3 Days Post-Op Subjective: Patient reports less pain. + flatus.  Objective: Vital signs in last 24 hours: Temp:  [98.3 F (36.8 C)-99.1 F (37.3 C)] 98.8 F (37.1 C) (08/22 0600) Pulse Rate:  [71-95] 71 (08/22 0600) Resp:  [16-18] 16 (08/22 0600) BP: (115-121)/(65-74) 117/70 (08/22 0600) SpO2:  [87 %-94 %] 93 % (08/22 0600)  Intake/Output from previous day: 08/21 0701 - 08/22 0700 In: 60 [P.O.:60] Out: 1050 [Urine:1000; Drains:50] Intake/Output this shift: No intake/output data recorded.  Physical Exam:  Constitutional: Vital signs reviewed. WD WN in NAD   Eyes: PERRL, No scleral icterus.   Cardiovascular: RRR Pulmonary/Chest: Normal effort Abdominal: Appropriately tender. Wound C/D/I.  Extremities: No cyanosis or edema   Lab Results: Recent Labs    10/19/18 0454 10/20/18 0447 10/21/18 0515  HGB 10.6* 9.8* 8.5*  HCT 32.2* 29.8* 26.1*   BMET Recent Labs    10/19/18 0454 10/20/18 0447  NA 135 137  K 4.2 3.8  CL 107 106  CO2 22 24  GLUCOSE 123* 127*  BUN 13 13  CREATININE 1.08 1.16  CALCIUM 8.0* 8.5*   No results for input(s): LABPT, INR in the last 72 hours. No results for input(s): LABURIN in the last 72 hours. Results for orders placed or performed during the hospital encounter of 10/14/18  SARS CORONAVIRUS 2 Nasal Swab Aptima Multi Swab     Status: None   Collection Time: 10/14/18 11:31 AM   Specimen: Aptima Multi Swab; Nasal Swab  Result Value Ref Range Status   SARS Coronavirus 2 NEGATIVE NEGATIVE Final    Comment: (NOTE) SARS-CoV-2 target nucleic acids are NOT DETECTED. The SARS-CoV-2 RNA is generally detectable in upper and lower respiratory specimens during the acute phase of infection. Negative results do not preclude SARS-CoV-2 infection, do not rule out co-infections with other pathogens, and should not be used as the sole basis for treatment or other patient management decisions. Negative results must be combined with clinical  observations, patient history, and epidemiological information. The expected result is Negative. Fact Sheet for Patients: SugarRoll.be Fact Sheet for Healthcare Providers: https://www.woods-mathews.com/ This test is not yet approved or cleared by the [place] FDA and  has been authorized for detection and/or diagnosis of SARS-CoV-2 by FDA under an Emergency Use Authorization (EUA). This EUA will remain  in effect (meaning this test can be used) for the duration of the COVID-19 declaration under Section 56 4(b)(1) of the Act, 21 U.S.C. section 360bbb-3(b)(1), unless the authorization is terminated or revoked sooner. Performed at [facility], [place] [address], [place], [place] [address]     Studies/Results: No results found.  Assessment/Plan:   POD3 Left HA rad nephrectomy. Drainage much less. Tolerating diet. Will send JP fluid again for amyllase. Leave drain for now. Ambulate.   LOS: 3 days   Jorja Loa 10/21/2018, 7:50 AM

## 2018-10-21 NOTE — Plan of Care (Signed)
Pt up ambulating in hall with RN. Pt tolerated well. O2 sats during ambulation 98% RA. Will continue to monitor.

## 2018-10-22 NOTE — Plan of Care (Signed)
Discharge instructions reviewed with patient and his wife, questions answered, verbalized understanding.  Patient transported to main entrance of the hospital via wheelchair to be taken home by wife.

## 2018-10-22 NOTE — Discharge Summary (Signed)
Patient ID: [name] MRN: [id number] DOB/AGE: Mar 01, 1941 78 y.o.  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 Recurrent ventral incisional hernia . Left renal mass . Left adrenal mass ([place]) . Sick sinus syndrome ([place]) . 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 . Essential hypertension . Hyperlipidemia with target LDL less than 70 . History of anaphylaxis . BPH (benign prostatic hyperplasia) . GERD (gastroesophageal reflux disease) . Renal mass   Consults: None   Discharge Medications: Allergies as of 10/22/2018      Reactions   Hydrocodone    Caused confusion    Monosodium Glutamate Other (See Comments)   Caused headaches   Naproxen Sodium    Mouth blisters with prolonged use.  Left nephrectomy 2020   Other    Perfume - headaches       Medication List    TAKE these medications   4-Way Fast Acting 1 % nasal spray Generic drug: phenylephrine Place 1 drop into both nostrils every 6 (six) hours as needed for congestion.   atorvastatin 20 MG tablet Commonly known as: LIPITOR Take 1 tablet (20 mg total) by mouth daily.   Eliquis 5 MG Tabs tablet Generic drug: apixaban Take 1 tablet by mouth twice daily What changed: how much to take   esomeprazole 40 MG capsule Commonly known as: NEXIUM Take 1 capsule (40 mg total) by mouth daily. What changed: when to take this   naproxen sodium 220 MG tablet Commonly known as: ALEVE Take 550 mg by mouth daily as needed (pain).   omega-3 acid ethyl esters 1 g capsule Commonly known as: LOVAZA Take 2 capsules by mouth twice daily   OVER THE COUNTER MEDICATION Take 1 tablet by mouth at bedtime as needed (sleep). Midnight otc sleep aid   oxyCODONE 5 MG immediate release tablet Commonly known as: Oxy  IR/ROXICODONE Take 1 tablet (5 mg total) by mouth every 4 (four) hours as needed for moderate pain.   Refresh Liquigel 1 % Gel Generic drug: Carboxymethylcellulose Sodium Place 1 drop into both eyes at bedtime.   REFRESH TEARS OP Place 1 drop into both eyes 2 (two) times daily.   solifenacin 10 MG tablet Commonly known as: VESICARE Take 1 tablet (10 mg total) by mouth daily.        Significant Diagnostic Studies:  No results found.  Brief H and P: For complete details please refer to admission H and P, but in brief the patient was admitted for management of left renal/adrenal masses and ventral hernia  Hospital Course: He underwent hand-assisted laparoscopic left radical nephrectomy/adrenalectomy as well as ventral hernia repair on 8.19.2020.  He tolerated the surgery well.  Unintended injury to the pancreas occurred during the procedure which was noted and properly drained.  The patient had a relatively uncomplicated postoperative course.  Drainage from his JP drain decreased during the hospitalization and was tested on the day prior to surgery with normal serum amylase.  His drain was discontinued prior to discharge.  He was advanced to a regular diet, ambulated, and had return of bowel function.  He was discharged on postoperative day 4. Principal Problem:   Left renal mass Active Problems:   BPH (benign prostatic hyperplasia)   GERD (gastroesophageal reflux disease)   Hyperlipidemia with target LDL less than 70   History of anaphylaxis  Essential hypertension   Left adrenal mass (HCC)   Sick sinus syndrome (HCC)   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   Recurrent ventral incisional hernia   Renal mass   Day of Discharge BP 126/67 (BP Location: Right Arm)   Pulse 71   Temp 98 F (36.7 C) (Oral)   Resp 14   Ht 5\' 7"  (1.702 m)   Wt 84.4 kg   SpO2 94%   BMI 29.13 kg/m   No results found for this or any previous visit (from the past 24  hour(s)).  Physical Exam: General: Alert and awake oriented x3 not in any acute distress. HEENT: anicteric sclera, pupils reactive to light and accommodation CVS: S1-S2 clear no murmur rubs or gallops Chest: clear to auscultation bilaterally, no wheezing rales or rhonchi Abdomen: soft nontender, nondistended, normal bowel sounds, no organomegaly Extremities: no cyanosis, clubbing or edema noted bilaterally Neuro: Cranial nerves II-XII intact, no focal neurological deficits  Disposition: Home  Diet: Regular  Activity: Gradually increase   Disposition and Follow-up:   Follow-up scheduled already  TESTS THAT NEED FOLLOW-UP  Pathology review with Dr. Gloriann Loan  DISCHARGE FOLLOW-UP  Follow-up Information    Michael Boston, MD. Schedule an appointment as soon as possible for a visit in 3 weeks.   Specialty: General Surgery Why: To follow up after your operation, To follow up after your hospital stay Contact information: [address] [place] [address] [phone]           Time spent on Discharge:  15 minutes, patient's wife was called  Signed: Jorja Loa 10/22/2018, 8:34 AM

## 2018-10-23 ENCOUNTER — Telehealth: Payer: Self-pay | Admitting: *Deleted

## 2018-10-23 NOTE — Telephone Encounter (Signed)
Pt was on TCM report was admitted 10/18/18 for management of left renal/adrenal masses and ventral hernia. Pt underwent hand-assisted laparoscopic left radical nephrectomy/adrenalectomy as well as ventral hernia repair on 8.19.2020.  He tolerated the surgery well. He had unintended injury to the pancreas occurred during the procedure which was noted and properly drained. Pt D/C 06/22/18, and will follow-up w/Gross, Remo Lipps in 2-3 weeks.Marland KitchenChryl Heck

## 2018-10-26 ENCOUNTER — Encounter: Payer: Self-pay | Admitting: Cardiology

## 2018-10-26 NOTE — Progress Notes (Signed)
Remote pacemaker transmission.   

## 2018-11-01 DIAGNOSIS — C642 Malignant neoplasm of left kidney, except renal pelvis: Secondary | ICD-10-CM | POA: Diagnosis not present

## 2018-11-08 ENCOUNTER — Telehealth: Payer: Self-pay | Admitting: Oncology

## 2018-11-08 NOTE — Telephone Encounter (Signed)
Received a new patient referral from [facility] for metastatic lesion to the left adrenal gland. I cld and spoke to Frank [name]'s wife and scheduled him to see Dr. [name] on 9/24 at 2pm. Aware to arrive 15 minutes early.

## 2018-11-22 ENCOUNTER — Other Ambulatory Visit: Payer: Self-pay

## 2018-11-22 ENCOUNTER — Telehealth (INDEPENDENT_AMBULATORY_CARE_PROVIDER_SITE_OTHER): Payer: Medicare Other | Admitting: Internal Medicine

## 2018-11-22 ENCOUNTER — Encounter: Payer: Self-pay | Admitting: Internal Medicine

## 2018-11-22 VITALS — BP 145/84 | HR 98 | Ht 67.0 in | Wt 172.0 lb

## 2018-11-22 DIAGNOSIS — I480 Paroxysmal atrial fibrillation: Secondary | ICD-10-CM

## 2018-11-22 DIAGNOSIS — I495 Sick sinus syndrome: Secondary | ICD-10-CM | POA: Diagnosis not present

## 2018-11-22 NOTE — Progress Notes (Signed)
Electrophysiology TeleHealth Note  Due to national recommendations of social distancing due to [place] 19, an audio telehealth visit is felt to be most appropriate for this patient at this time.  Verbal consent was obtained by me for the telehealth visit today.  The patient does not have capability for a virtual visit.  A phone visit is therefore required today.   Date:  11/22/2018   ID:  Frank Hoover., DOB 1941-01-26, MRN [id number]  Location: patient's home  Provider location:  [facility]  Evaluation Performed: Follow-up visit  PCP:  [name], MD   Electrophysiologist:  Dr Rayann Heman  Chief Complaint:  Pacemaker follow up  History of Present Illness:    Frank Cuff. is a 78 y.o. male who presents via telehealth conferencing today.  Since last being seen in our clinic, the patient reports doing relatively well.  He is recovering from recent nephrectomy and has some ongoing fatigue.  Today, he denies symptoms of palpitations, chest pain, shortness of breath,  lower extremity edema, dizziness, presyncope, or syncope.  The patient is otherwise without complaint today.  The patient denies symptoms of fevers, chills, cough, or new SOB worrisome for COVID 19.  Past Medical History:  Diagnosis Date  . Diverticulosis   . Esophageal reflux   . Esophageal stricture   . HA (headache)   . Hearing loss   . Hiatal hernia   . Hypertriglyceridemia   . Hypoglycemia   . Obesity, unspecified   . PAF (paroxysmal atrial fibrillation) (HCC)    Echo with normal LV function and mild LVH  . Presence of permanent cardiac pacemaker   . Renal mass    left  . Syncope    presented with atrial fib converted with Diltiazem  . Systemic inflammatory response syndrome ([place]) 06/26/2018    Past Surgical History:  Procedure Laterality Date  . CYSTOSCOPY WITH URETEROSCOPY AND STENT PLACEMENT Left 08/16/2018   Procedure: CYSTOSCOPY WITH LEFT RETROGRADE PYELOGRAM, BALLOON DILATION LEFT  URETER, STENT PLACEMENT;  Surgeon: Lucas Mallow, MD;  Location: [facility];  Service: Urology;  Laterality: Left;  . HERNIA REPAIR    . INSERT / REPLACE / REMOVE PACEMAKER    . LAPAROSCOPIC ASSISTED VENTRAL HERNIA REPAIR N/A 10/18/2018   Procedure: LAPAROSCOPIC ASSISTED VENTRAL WALL HERNIA REPAIR WITH MESH;  Surgeon: Michael Boston, MD;  Location: [facility];  Service: General;  Laterality: N/A;  . LAPAROSCOPIC NEPHRECTOMY, HAND ASSISTED Left 10/18/2018   Procedure: LEFT HAND ASSISTED LAPAROSCOPIC RADICAL NEPHRECTOMY WITH ADRENALECTOMY;  Surgeon: Lucas Mallow, MD;  Location: [facility];  Service: Urology;  Laterality: Left;  . LOOP RECORDER INSERTION N/A 04/26/2018   Procedure: LOOP RECORDER INSERTION;  Surgeon: Thompson Grayer, MD;  Location: [place] [facility];  Service: Cardiovascular;  Laterality: N/A;  . LOOP RECORDER REMOVAL N/A 07/18/2018   Procedure: LOOP RECORDER REMOVAL;  Surgeon: Thompson Grayer, MD;  Location: [facility];  Service: Cardiovascular;  Laterality: N/A;  . NASAL SEPTUM SURGERY    . PACEMAKER IMPLANT N/A 07/18/2018   Procedure: PACEMAKER IMPLANT;  Surgeon: Thompson Grayer, MD;  Location: [place] [facility];  Service: Cardiovascular;  Laterality: N/A;  . TONSILLECTOMY    . TONSILLECTOMY    . UMBILICAL HERNIA REPAIR  1993  . WISDOM TOOTH EXTRACTION     about 78 years old    Current Outpatient Medications  Medication Sig Dispense Refill  . Carboxymethylcellulose Sodium (REFRESH LIQUIGEL) 1 % GEL Place 1  drop into both eyes at bedtime.    . Carboxymethylcellulose Sodium (REFRESH TEARS OP) Place 1 drop into both eyes 2 (two) times daily.     Marland Kitchen ELIQUIS 5 MG TABS tablet Take 1 tablet by mouth twice daily (Patient taking differently: Take 5 mg by mouth 2 (two) times daily. ) 60 tablet 5  . esomeprazole (NEXIUM) 40 MG capsule Take 1 capsule (40 mg total) by mouth daily. (Patient taking differently: Take 40 mg by mouth daily before breakfast. ) 30 capsule 11  . naproxen sodium  (ALEVE) 220 MG tablet Take 550 mg by mouth daily as needed (pain).    Marland Kitchen omega-3 acid ethyl esters (LOVAZA) 1 g capsule Take 2 capsules by mouth twice daily (Patient taking differently: Take 2 g by mouth 2 (two) times daily. ) 360 capsule 0  . OVER THE COUNTER MEDICATION Take 1 tablet by mouth at bedtime as needed (sleep). Midnight otc sleep aid    . phenylephrine (4-WAY FAST ACTING) 1 % nasal spray Place 1 drop into both nostrils every 6 (six) hours as needed for congestion.    . solifenacin (VESICARE) 10 MG tablet Take 1 tablet (10 mg total) by mouth daily. 90 tablet 1   No current facility-administered medications for this visit.     Allergies:   Hydrocodone, Monosodium glutamate, Naproxen sodium, and Other   Social History:  The patient  reports that he quit smoking about 31 years ago. He has never used smokeless tobacco. He reports that he does not drink alcohol or use drugs.   Family History:  The patient's  family history includes Bipolar disorder in his paternal grandmother and sister; Emphysema in his father; Gallbladder disease in his sister.   ROS:  Please see the history of present illness.   All other systems are personally reviewed and negative.    Exam:    Vital Signs:  BP (!) 145/84   Pulse 98   Ht 5\' 7"  (1.702 m)   Wt 172 lb (78 kg)   BMI 26.94 kg/m   Well sounding and appearing, alert and conversant, regular work of breathing   Labs/Other Tests and Data Reviewed:    Recent Labs: 01/11/2018: TSH 2.01 06/27/2018: ALT 30; Magnesium 1.8 10/18/2018: Platelets 172 10/20/2018: BUN 13; Creatinine, Ser 1.16; Potassium 3.8; Sodium 137 10/21/2018: Hemoglobin 8.5   Wt Readings from Last 3 Encounters:  11/22/18 172 lb (78 kg)  10/18/18 186 lb (84.4 kg)  10/11/18 186 lb (84.4 kg)     Last device remote is reviewed from [place] PDF which reveals normal device function, no arrhythmias    ASSESSMENT & PLAN:    1.  Sick sinus syndrome Doing well status post pacemaker  implant Normal device function by last remote  2.  Paroxysmal atrial fibrillation Burden low by device interrogation Continue Eliquis for CHADS2VASC of 2    Follow-up:  [facility], EP PA in 1 year    Patient Risk:  after full review of this patients clinical status, I feel that they are at moderate risk at this time.  Today, I have spent 15 minutes with the patient with telehealth technology discussing arrhythmia management .    Army Fossa, MD  11/22/2018 2:37 PM     [address] [place] [address] [phone] (office) [phone] (fax)

## 2018-11-23 ENCOUNTER — Inpatient Hospital Stay: Payer: Medicare Other | Attending: Oncology | Admitting: Oncology

## 2018-11-23 ENCOUNTER — Other Ambulatory Visit: Payer: Self-pay

## 2018-11-23 VITALS — BP 123/81 | HR 74 | Temp 98.7°F | Resp 17 | Ht 67.0 in | Wt 177.3 lb

## 2018-11-23 DIAGNOSIS — E781 Pure hyperglyceridemia: Secondary | ICD-10-CM | POA: Diagnosis not present

## 2018-11-23 DIAGNOSIS — E669 Obesity, unspecified: Secondary | ICD-10-CM | POA: Diagnosis not present

## 2018-11-23 DIAGNOSIS — C642 Malignant neoplasm of left kidney, except renal pelvis: Secondary | ICD-10-CM | POA: Insufficient documentation

## 2018-11-23 DIAGNOSIS — Z87891 Personal history of nicotine dependence: Secondary | ICD-10-CM | POA: Diagnosis not present

## 2018-11-23 DIAGNOSIS — Z905 Acquired absence of kidney: Secondary | ICD-10-CM

## 2018-11-23 DIAGNOSIS — Z950 Presence of cardiac pacemaker: Secondary | ICD-10-CM

## 2018-11-23 DIAGNOSIS — Z79899 Other long term (current) drug therapy: Secondary | ICD-10-CM

## 2018-11-23 DIAGNOSIS — Z7901 Long term (current) use of anticoagulants: Secondary | ICD-10-CM | POA: Insufficient documentation

## 2018-11-23 DIAGNOSIS — I480 Paroxysmal atrial fibrillation: Secondary | ICD-10-CM | POA: Insufficient documentation

## 2018-11-23 NOTE — Progress Notes (Signed)
Reason for the request:    Kidney cancer  HPI: I was asked by Dr. Gloriann Loan to evaluate Frank [name] for diagnosis of kidney cancer.  He is 78 year old man with was found to have a 7.5 cm large kidney mass and associated adrenal involvement that was detected on a CT scan incidentally in April 2020.  Based on these findings he underwent left laparoscopic radical nephrectomy and adrenalectomy completed by Dr. Gloriann Loan on 10/18/2018.  He also had a laparoscopic incisional hernia repair done the same time by Dr. Johney Maine.  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  A perinephric soft tissue biopsy showed no carcinoma.  It was also diagnosed with a compression fracture of his spine and MRI of the thoracic and lumbar spine obtained on 06/25/2018 which showed acute compression fractures of T12 but no evidence of metastatic disease.  Chest x-ray on Jul 19, 2018 did not show any pulmonary lesions or metastasis.  Since his operation, he is recovering slowly at this time and regaining most activities of daily living.  He is ambulating but not able to drive.  He reports his back pain has improved with physical therapy.  Denies any recent falls or syncope.  He denies any hematuria or dysuria.  His performance status and quality of life is improving and close to his baseline.   He does not report any headaches, blurry vision, syncope or seizures. Does not report any fevers, chills or sweats.  Does not report any cough, wheezing or hemoptysis.  Does not report any chest pain, palpitation, orthopnea or leg edema.  Does not report any nausea, vomiting or abdominal pain.  Does not report any constipation or diarrhea.  Does not report any skeletal complaints.    Does not report frequency, urgency or hematuria.  Does not report any skin rashes or lesions. Does not report any  heat or cold intolerance.  Does not report any lymphadenopathy or petechiae.  Does not report any anxiety or depression.  Remaining review of systems is negative.    Past Medical History:  Diagnosis Date  . Diverticulosis   . Esophageal reflux   . Esophageal stricture   . HA (headache)   . Hearing loss   . Hiatal hernia   . Hypertriglyceridemia   . Hypoglycemia   . Obesity, unspecified   . PAF (paroxysmal atrial fibrillation) (HCC)    Echo with normal LV function and mild LVH  . Presence of permanent cardiac pacemaker   . Renal mass    left  . Syncope    presented with atrial fib converted with Diltiazem  . Systemic inflammatory response syndrome ([place]) 06/26/2018  :  Past Surgical History:  Procedure Laterality Date  . CYSTOSCOPY WITH URETEROSCOPY AND STENT PLACEMENT Left 08/16/2018   Procedure: CYSTOSCOPY WITH LEFT RETROGRADE PYELOGRAM, BALLOON DILATION LEFT URETER, STENT PLACEMENT;  Surgeon: Frank Mallow, MD;  [facility];  Service: Urology;  Laterality: Left;  . HERNIA REPAIR    . INSERT / REPLACE / REMOVE PACEMAKER    . LAPAROSCOPIC ASSISTED VENTRAL HERNIA REPAIR N/A 10/18/2018   Procedure: LAPAROSCOPIC ASSISTED VENTRAL WALL HERNIA REPAIR WITH MESH;  Surgeon: Michael Boston, MD;  [facility];  Service: General;  Laterality: N/A;  . LAPAROSCOPIC NEPHRECTOMY, HAND ASSISTED Left 10/18/2018   Procedure: LEFT HAND ASSISTED LAPAROSCOPIC RADICAL NEPHRECTOMY WITH ADRENALECTOMY;  Surgeon: Frank Mallow, MD;  [facility];  Service: Urology;  Laterality: Left;  . LOOP RECORDER INSERTION N/A 04/26/2018   Procedure: LOOP RECORDER INSERTION;  Surgeon: Thompson Grayer, MD;  Location: [facility];  Service: Cardiovascular;  Laterality: N/A;  . LOOP RECORDER REMOVAL N/A 07/18/2018   Procedure: LOOP RECORDER REMOVAL;  Surgeon: Thompson Grayer, MD;  Location: [place];  Service: Cardiovascular;  Laterality: N/A;  . NASAL SEPTUM SURGERY    . PACEMAKER IMPLANT  N/A 07/18/2018   Procedure: PACEMAKER IMPLANT;  Surgeon: Thompson Grayer, MD;  Location: [place] [facility];  Service: Cardiovascular;  Laterality: N/A;  . TONSILLECTOMY    . TONSILLECTOMY    . UMBILICAL HERNIA REPAIR  1993  . WISDOM TOOTH EXTRACTION     about 78 years old  :   Current Outpatient Medications:  .  Carboxymethylcellulose Sodium (REFRESH LIQUIGEL) 1 % GEL, Place 1 drop into both eyes at bedtime., Disp: , Rfl:  .  Carboxymethylcellulose Sodium (REFRESH TEARS OP), Place 1 drop into both eyes 2 (two) times daily. , Disp: , Rfl:  .  ELIQUIS 5 MG TABS tablet, Take 1 tablet by mouth twice daily (Patient taking differently: Take 5 mg by mouth 2 (two) times daily. ), Disp: 60 tablet, Rfl: 5 .  esomeprazole (NEXIUM) 40 MG capsule, Take 1 capsule (40 mg total) by mouth daily. (Patient taking differently: Take 40 mg by mouth daily before breakfast. ), Disp: 30 capsule, Rfl: 11 .  naproxen sodium (ALEVE) 220 MG tablet, Take 550 mg by mouth daily as needed (pain)., Disp: , Rfl:  .  omega-3 acid ethyl esters (LOVAZA) 1 g capsule, Take 2 capsules by mouth twice daily (Patient taking differently: Take 2 g by mouth 2 (two) times daily. ), Disp: 360 capsule, Rfl: 0 .  OVER THE COUNTER MEDICATION, Take 1 tablet by mouth at bedtime as needed (sleep). Midnight otc sleep aid, Disp: , Rfl:  .  phenylephrine (4-WAY FAST ACTING) 1 % nasal spray, Place 1 drop into both nostrils every 6 (six) hours as needed for congestion., Disp: , Rfl:  .  solifenacin (VESICARE) 10 MG tablet, Take 1 tablet (10 mg total) by mouth daily., Disp: 90 tablet, Rfl: 1:  Allergies  Allergen Reactions  . Hydrocodone     Caused confusion   . Monosodium Glutamate Other (See Comments)    Caused headaches  . Naproxen Sodium     Mouth blisters with prolonged use.  Left nephrectomy 2020  . Other     Perfume - headaches   :  Family History  Problem Relation Age of Onset  . Emphysema Father   . Gallbladder disease Sister   .  Bipolar disorder Sister   . Bipolar disorder Paternal Grandmother   . Colon cancer Neg Hx   :  Social History   Socioeconomic History  . Marital status: Married    Spouse name: Not on file  . Number of children: 1  . Years of education: Not on file  . Highest education level: Not on file  Occupational History  . 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 Financial resource strain: Not hard at all  . Food insecurity    Worry: Never true    Inability: Never true  . Transportation needs    Medical: No    Non-medical: No  Tobacco Use  . Smoking status: Former Smoker    Quit date: 03/02/1987    Years since quitting: 31.7  .  Smokeless tobacco: Never Used  Substance and Sexual Activity  . Alcohol use: No    Alcohol/week: 0.0 standard drinks  . Drug use: No  . Sexual activity: Not Currently  Lifestyle  . Physical activity    Days per week: 5 days    Minutes per session: 50 min  . Stress: Not at all  Relationships  . Social connections    Talks on phone: More than three times a week    Gets together: More than three times a week    Attends religious service: 1 to 4 times per year    Active member of club or organization: Yes    Attends meetings of clubs or organizations: More than 4 times per year    Relationship status: Married  . Intimate partner violence    Fear of current or ex partner: No    Emotionally abused: No    Physically abused: No    Forced sexual activity: No  Other Topics Concern  . Not on file  Social History Narrative  . Not on file  :  Pertinent items are noted in HPI.  Exam: Blood pressure 123/81, pulse 74, temperature 98.7 F (37.1 C), temperature source Oral, resp. rate 17, height 5\' 7"  (1.702 m), weight 177 lb 4.8 oz (80.4 kg), SpO2 99 %.   General appearance: alert and cooperative appeared without distress. Head: atraumatic without any abnormalities. Eyes: conjunctivae/corneas clear. PERRL.  Sclera anicteric. Throat: lips, mucosa, and tongue  normal; without oral thrush or ulcers. Resp: clear to auscultation bilaterally without rhonchi, wheezes or dullness to percussion. Cardio: regular rate and rhythm, S1, S2 normal, no murmur, click, rub or gallop GI: soft, non-tender; bowel sounds normal; no masses,  no organomegaly Skin: Skin color, texture, turgor normal. No rashes or lesions Lymph nodes: Cervical, supraclavicular, and axillary nodes normal. Neurologic: Grossly normal without any motor, sensory or deep tendon reflexes. Musculoskeletal: No joint deformity or effusion.  CBC    Component Value Date/Time   WBC 17.6 (H) 10/18/2018 1150   RBC 3.94 (L) 10/18/2018 1150   HGB 8.5 (L) 10/21/2018 0515   HGB 14.1 10/12/2017 0729   HGB 15.1 11/09/2013   HCT 26.1 (L) 10/21/2018 0515   HCT 40.5 10/12/2017 0729   HCT 44 11/09/2013   PLT 172 10/18/2018 1150   PLT 131 (L) 10/12/2017 0729   MCV 94.7 10/18/2018 1150   MCV 97 10/12/2017 0729   MCH 31.2 10/18/2018 1150   MCHC 33.0 10/18/2018 1150   RDW 13.7 10/18/2018 1150   RDW 14.7 10/12/2017 0729   LYMPHSABS 1.0 06/26/2018 1638   MONOABS 1.3 (H) 06/26/2018 1638   EOSABS 0.0 06/26/2018 1638   BASOSABS 0.0 06/26/2018 1638     Assessment and Plan:   78 year old man with:  1.  Renal cell carcinoma diagnosed in April 2020.  He presented with 6 x 5.7 cm mass of the left kidney.  He underwent left radical nephrectomy laparoscopically performed by Dr. Gloriann Loan on October 18, 2018 with a final pathology showed T2 aNX M1 clear-cell histology with 1.6 cm involvement of left adrenal gland.  He final pathology showed no sarcomatoid features with histological grade 2.  The natural course of this disease as well as the role for additional systemic therapy was discussed.  Given the fact that he had a resected stage IV disease without any evidence of metastatic disease, no additional therapy is indicated at this time.  The role of systemic therapy in this particular setting has not  shown to be  effective or changes the outcome.  I have recommended active surveillance and repeat imaging studies in the near future and institute systemic therapy at that time.  He is scheduled to have repeat imaging studies in November 2020 by Dr. Gloriann Loan.  If he has evidence to suggest stage IV disease, systemic therapy could be considered.  These options would include oral targeted therapy, immunotherapy or combination of both depending on his volume of disease.    The rationale as well as risks and benefits of all of these agents were reviewed briefly today and potentially single agent oral targeted therapy would be reasonable if he has low-volume disease that requires treatment.  The plan is to arrange follow-up after his scheduled CT scan in November for a repeat evaluation.  2.  Follow-up: Will be in November for repeat evaluation and discussion for further therapy if needed.  60  minutes was spent with the patient face-to-face today.  More than 50% of time was spent on updating his disease status, reviewing imaging studies, reviewing pathology reports and answering questions regarding future plan of care.     Thank you for the referral. A copy of this consult has been forwarded to the requesting physician.

## 2018-11-29 ENCOUNTER — Telehealth: Payer: Self-pay | Admitting: Oncology

## 2018-11-29 DIAGNOSIS — M4856XA Collapsed vertebra, not elsewhere classified, lumbar region, initial encounter for fracture: Secondary | ICD-10-CM | POA: Diagnosis not present

## 2018-11-29 DIAGNOSIS — S22000A Wedge compression fracture of unspecified thoracic vertebra, initial encounter for closed fracture: Secondary | ICD-10-CM | POA: Diagnosis not present

## 2018-11-29 DIAGNOSIS — M545 Low back pain: Secondary | ICD-10-CM | POA: Diagnosis not present

## 2018-11-29 NOTE — Telephone Encounter (Signed)
Called and left msg. Mailed printout  °

## 2018-11-30 ENCOUNTER — Encounter: Payer: Self-pay | Admitting: Internal Medicine

## 2018-12-01 DIAGNOSIS — H2513 Age-related nuclear cataract, bilateral: Secondary | ICD-10-CM | POA: Diagnosis not present

## 2018-12-01 DIAGNOSIS — H524 Presbyopia: Secondary | ICD-10-CM | POA: Diagnosis not present

## 2018-12-01 NOTE — Telephone Encounter (Signed)
Pt has been schedule/Dexa has also been scheduled for same day.

## 2018-12-04 ENCOUNTER — Inpatient Hospital Stay: Admission: RE | Admit: 2018-12-04 | Payer: Medicare Other | Source: Ambulatory Visit

## 2018-12-04 ENCOUNTER — Ambulatory Visit: Payer: Medicare Other | Admitting: Internal Medicine

## 2018-12-06 ENCOUNTER — Ambulatory Visit (INDEPENDENT_AMBULATORY_CARE_PROVIDER_SITE_OTHER)
Admission: RE | Admit: 2018-12-06 | Discharge: 2018-12-06 | Disposition: A | Discharge status: Home / Self Care | Payer: Medicare Other | Source: Ambulatory Visit | Attending: Internal Medicine | Admitting: Internal Medicine

## 2018-12-06 ENCOUNTER — Encounter: Payer: Self-pay | Admitting: Internal Medicine

## 2018-12-06 ENCOUNTER — Other Ambulatory Visit: Payer: Self-pay

## 2018-12-06 ENCOUNTER — Ambulatory Visit (INDEPENDENT_AMBULATORY_CARE_PROVIDER_SITE_OTHER): Payer: Medicare Other | Admitting: Internal Medicine

## 2018-12-06 ENCOUNTER — Other Ambulatory Visit (INDEPENDENT_AMBULATORY_CARE_PROVIDER_SITE_OTHER): Payer: Medicare Other

## 2018-12-06 VITALS — BP 120/70 | HR 100 | Temp 98.6°F | Ht 67.0 in | Wt 176.0 lb

## 2018-12-06 DIAGNOSIS — R739 Hyperglycemia, unspecified: Secondary | ICD-10-CM

## 2018-12-06 DIAGNOSIS — M8080XA Other osteoporosis with current pathological fracture, unspecified site, initial encounter for fracture: Secondary | ICD-10-CM | POA: Insufficient documentation

## 2018-12-06 DIAGNOSIS — Z23 Encounter for immunization: Secondary | ICD-10-CM

## 2018-12-06 DIAGNOSIS — D539 Nutritional anemia, unspecified: Secondary | ICD-10-CM

## 2018-12-06 DIAGNOSIS — D508 Other iron deficiency anemias: Secondary | ICD-10-CM | POA: Diagnosis not present

## 2018-12-06 DIAGNOSIS — R7303 Prediabetes: Secondary | ICD-10-CM | POA: Insufficient documentation

## 2018-12-06 HISTORY — DX: Prediabetes: R73.03

## 2018-12-06 LAB — CBC WITH DIFFERENTIAL/PLATELET
Basophils Absolute: 0 10*3/uL (ref 0.0–0.1)
Basophils Relative: 0.3 % (ref 0.0–3.0)
Eosinophils Absolute: 0.1 10*3/uL (ref 0.0–0.7)
Eosinophils Relative: 1 % (ref 0.0–5.0)
HCT: 34.7 % — ABNORMAL LOW (ref 39.0–52.0)
Hemoglobin: 11.4 g/dL — ABNORMAL LOW (ref 13.0–17.0)
Lymphocytes Relative: 12.7 % (ref 12.0–46.0)
Lymphs Abs: 1.1 10*3/uL (ref 0.7–4.0)
MCHC: 32.8 g/dL (ref 30.0–36.0)
MCV: 88.1 fl (ref 78.0–100.0)
Monocytes Absolute: 0.7 10*3/uL (ref 0.1–1.0)
Monocytes Relative: 8 % (ref 3.0–12.0)
Neutro Abs: 6.7 10*3/uL (ref 1.4–7.7)
Neutrophils Relative %: 78 % — ABNORMAL HIGH (ref 43.0–77.0)
Platelets: 211 10*3/uL (ref 150.0–400.0)
RBC: 3.94 Mil/uL — ABNORMAL LOW (ref 4.22–5.81)
RDW: 15.8 % — ABNORMAL HIGH (ref 11.5–15.5)
WBC: 8.5 10*3/uL (ref 4.0–10.5)

## 2018-12-06 LAB — BASIC METABOLIC PANEL
BUN: 24 mg/dL — ABNORMAL HIGH (ref 6–23)
CO2: 21 mEq/L (ref 19–32)
Calcium: 9.4 mg/dL (ref 8.4–10.5)
Chloride: 104 mEq/L (ref 96–112)
Creatinine, Ser: 1.26 mg/dL (ref 0.40–1.50)
GFR: 55.52 mL/min — ABNORMAL LOW (ref >60.00)
Glucose, Bld: 120 mg/dL — ABNORMAL HIGH (ref 70–99)
Potassium: 3.8 mEq/L (ref 3.5–5.1)
Sodium: 136 mEq/L (ref 135–145)

## 2018-12-06 LAB — VITAMIN B12: Vitamin B-12: 312 pg/mL (ref 211–911)

## 2018-12-06 LAB — FERRITIN: Ferritin: 14.7 ng/mL — ABNORMAL LOW (ref 22.0–322.0)

## 2018-12-06 LAB — IBC PANEL
Iron: 34 ug/dL — ABNORMAL LOW (ref 42–165)
Saturation Ratios: 8.8 % — ABNORMAL LOW (ref 20.0–50.0)
Transferrin: 275 mg/dL (ref 212.0–360.0)

## 2018-12-06 LAB — FOLATE: Folate: 7.8 ng/mL (ref >5.9)

## 2018-12-06 LAB — VITAMIN D 25 HYDROXY (VIT D DEFICIENCY, FRACTURES): VITD: 36.73 ng/mL (ref 30.00–100.00)

## 2018-12-06 LAB — HM DEXA SCAN: HM Dexa Scan: -2.7

## 2018-12-06 LAB — HEMOGLOBIN A1C: Hgb A1c MFr Bld: 6.2 % (ref 4.6–6.5)

## 2018-12-06 NOTE — Patient Instructions (Signed)
Anemia  Anemia is a condition in which you do not have enough red blood cells or hemoglobin. Hemoglobin is a substance in red blood cells that carries oxygen. When you do not have enough red blood cells or hemoglobin (are anemic), your body cannot get enough oxygen and your organs may not work properly. As a result, you may feel very tired or have other problems. What are the causes? Common causes of anemia include:  Excessive bleeding. Anemia can be caused by excessive bleeding inside or outside the body, including bleeding from the intestine or from periods in women.  Poor nutrition.  Long-lasting (chronic) kidney, thyroid, and liver disease.  Bone marrow disorders.  Cancer and treatments for cancer.  HIV (human immunodeficiency virus) and AIDS (acquired immunodeficiency syndrome).  Treatments for HIV and AIDS.  Spleen problems.  Blood disorders.  Infections, medicines, and autoimmune disorders that destroy red blood cells. What are the signs or symptoms? Symptoms of this condition include:  Minor weakness.  Dizziness.  Headache.  Feeling heartbeats that are irregular or faster than normal (palpitations).  Shortness of breath, especially with exercise.  Paleness.  Cold sensitivity.  Indigestion.  Nausea.  Difficulty sleeping.  Difficulty concentrating. Symptoms may occur suddenly or develop slowly. If your anemia is mild, you may not have symptoms. How is this diagnosed? This condition is diagnosed based on:  Blood tests.  Your medical history.  A physical exam.  Bone marrow biopsy. Your health care provider may also check your stool (feces) for blood and may do additional testing to look for the cause of your bleeding. You may also have other tests, including:  Imaging tests, such as a CT scan or MRI.  Endoscopy.  Colonoscopy. How is this treated? Treatment for this condition depends on the cause. If you continue to lose a lot of blood, you may  need to be treated at a hospital. Treatment may include:  Taking supplements of iron, vitamin S31, or folic acid.  Taking a hormone medicine (erythropoietin) that can help to stimulate red blood cell growth.  Having a blood transfusion. This may be needed if you lose a lot of blood.  Making changes to your diet.  Having surgery to remove your spleen. Follow these instructions at home:  Take over-the-counter and prescription medicines only as told by your health care provider.  Take supplements only as told by your health care provider.  Follow any diet instructions that you were given.  Keep all follow-up visits as told by your health care provider. This is important. Contact a health care provider if:  You develop new bleeding anywhere in the body. Get help right away if:  You are very weak.  You are short of breath.  You have pain in your abdomen or chest.  You are dizzy or feel faint.  You have trouble concentrating.  You have bloody or black, tarry stools.  You vomit repeatedly or you vomit up blood. Summary  Anemia is a condition in which you do not have enough red blood cells or enough of a substance in your red blood cells that carries oxygen (hemoglobin).  Symptoms may occur suddenly or develop slowly.  If your anemia is mild, you may not have symptoms.  This condition is diagnosed with blood tests as well as a medical history and physical exam. Other tests may be needed.  Treatment for this condition depends on the cause of the anemia. This information is not intended to replace advice given to you by  your health care provider. Make sure you discuss any questions you have with your health care provider. Document Released: 03/25/2004 Document Revised: 01/28/2017 Document Reviewed: 03/19/2016 Elsevier Patient Education  2020 [employer].

## 2018-12-06 NOTE — Progress Notes (Signed)
Subjective:  Patient ID: Frank Hoover., male    DOB: 1940/10/16  Age: 78 y.o. MRN: [id number]  CC: Anemia   HPI Frank Bilotta. presents for f/up - He comes in today at the request of an orthopedic surgeon who told him he has lumbar vertebral fractures and needs to be screened for osteoporosis.  Over the last 6 months he has been diagnosed with left renal cancer.  He is status post left nephrectomy/adrenalectomy.  He tells me that he has been told that they think that the surgery was a cure.  Outpatient Medications Prior to Visit  Medication Sig Dispense Refill  . ELIQUIS 5 MG TABS tablet Take 1 tablet by mouth twice daily (Patient taking differently: Take 5 mg by mouth 2 (two) times daily. ) 60 tablet 5  . esomeprazole (NEXIUM) 40 MG capsule Take 1 capsule (40 mg total) by mouth daily. (Patient taking differently: Take 40 mg by mouth daily before breakfast. ) 30 capsule 11  . naproxen sodium (ALEVE) 220 MG tablet Take 550 mg by mouth daily as needed (pain).    Marland Kitchen omega-3 acid ethyl esters (LOVAZA) 1 g capsule Take 2 capsules by mouth twice daily (Patient taking differently: Take 2 g by mouth 2 (two) times daily. ) 360 capsule 0  . OVER THE COUNTER MEDICATION Take 1 tablet by mouth at bedtime as needed (sleep). Midnight otc sleep aid    . phenylephrine (4-WAY FAST ACTING) 1 % nasal spray Place 1 drop into both nostrils every 6 (six) hours as needed for congestion.    . solifenacin (VESICARE) 10 MG tablet Take 1 tablet (10 mg total) by mouth daily. 90 tablet 1  . Carboxymethylcellulose Sodium (REFRESH LIQUIGEL) 1 % GEL Place 1 drop into both eyes at bedtime.    . Carboxymethylcellulose Sodium (REFRESH TEARS OP) Place 1 drop into both eyes 2 (two) times daily.      No facility-administered medications prior to visit.     ROS Review of Systems  Constitutional: Positive for fatigue and unexpected weight change (wt loss). Negative for diaphoresis.  HENT: Negative.  Negative for  trouble swallowing.   Eyes: Negative.   Respiratory: Negative for cough, chest tightness, shortness of breath and wheezing.   Cardiovascular: Negative for chest pain, palpitations and leg swelling.  Gastrointestinal: Negative for abdominal pain, blood in stool, constipation, diarrhea, nausea and vomiting.  Endocrine: Negative.   Genitourinary: Negative.  Negative for difficulty urinating, dysuria and hematuria.  Musculoskeletal: Positive for back pain. Negative for myalgias.  Skin: Negative.  Negative for color change and pallor.  Neurological: Negative.  Negative for weakness, light-headedness and headaches.  Hematological: Negative for adenopathy. Does not bruise/bleed easily.  Psychiatric/Behavioral: Negative.     Objective:  BP 120/70 (BP Location: Left Arm, Patient Position: Sitting, Cuff Size: Normal)   Pulse 100   Temp 98.6 F (37 C) (Oral)   Ht 5\' 7"  (1.702 m)   Wt 176 lb (79.8 kg)   SpO2 96%   BMI 27.57 kg/m   BP Readings from Last 3 Encounters:  12/06/18 120/70  11/23/18 123/81  11/22/18 (!) 145/84    Wt Readings from Last 3 Encounters:  12/06/18 176 lb (79.8 kg)  11/23/18 177 lb 4.8 oz (80.4 kg)  11/22/18 172 lb (78 kg)    Physical Exam Vitals signs reviewed.  HENT:     Nose: Nose normal.     Mouth/Throat:     Mouth: Mucous membranes are moist.  Eyes:  General: No scleral icterus.    Conjunctiva/sclera: Conjunctivae normal.  Neck:     Musculoskeletal: Neck supple.  Cardiovascular:     Rate and Rhythm: Normal rate and regular rhythm.     Heart sounds: No murmur.  Pulmonary:     Effort: Pulmonary effort is normal.     Breath sounds: No stridor. No wheezing, rhonchi or rales.  Abdominal:     General: Abdomen is protuberant. Bowel sounds are normal.     Palpations: There is no hepatomegaly or splenomegaly.     Tenderness: There is no abdominal tenderness.  Musculoskeletal: Normal range of motion.     Right lower leg: No edema.     Left lower leg:  No edema.  Lymphadenopathy:     Cervical: No cervical adenopathy.  Skin:    General: Skin is warm and dry.     Coloration: Skin is pale.  Neurological:     General: No focal deficit present.     Mental Status: He is alert.  Psychiatric:        Behavior: Behavior normal.     Lab Results  Component Value Date   WBC 8.5 12/06/2018   HGB 11.4 (L) 12/06/2018   HCT 34.7 (L) 12/06/2018   PLT 211.0 12/06/2018   GLUCOSE 120 (H) 12/06/2018   CHOL 159 10/12/2017   TRIG 183 (H) 10/12/2017   HDL 27 (L) 10/12/2017   LDLDIRECT 91.0 04/23/2015   LDLCALC 95 10/12/2017   ALT 30 06/27/2018   AST 19 06/27/2018   NA 136 12/06/2018   K 3.8 12/06/2018   CL 104 12/06/2018   CREATININE 1.26 12/06/2018   BUN 24 (H) 12/06/2018   CO2 21 12/06/2018   TSH 2.01 01/11/2018   PSA 0.18 01/11/2018   INR 1.2 10/11/2018   HGBA1C 6.2 12/06/2018    No results found.  Assessment & Plan:   Margaret was seen today for anemia.  Diagnoses and all orders for this visit:  Need for influenza vaccination -     Flu Vaccine QUAD High Dose(Fluad)  Deficiency anemia- His H&H have improved but he has iron deficiency anemia.  I will screen him for other vitamin deficiencies. -     CBC with Differential/Platelet; Future -     Vitamin B12; Future -     IBC panel; Future -     Folate; Future -     Ferritin; Future -     Reticulocytes; Future -     Vitamin B1; Future  Hyperglycemia- His A1c is at 6.2%.  He is prediabetic.  Medical therapy is not indicated. -     Basic metabolic panel; Future -     Hemoglobin A1c; Future  Localized osteoporosis with current pathological fracture, initial encounter- I recommended that he start receiving Prolia for this. -     VITAMIN D 25 Hydroxy (Vit-D Deficiency, Fractures); Future -     DG Bone Density; Future  Iron deficiency anemia due to dietary causes- I recommended that he receive a series of iron infusions.   I have discontinued Frank Hoover. "Frank [name]"'s  Carboxymethylcellulose Sodium and Carboxymethylcellulose Sodium (REFRESH TEARS OP). I am also having him maintain his esomeprazole, solifenacin, Eliquis, omega-3 acid ethyl esters, 4-Way Fast Acting, naproxen sodium, and OVER THE COUNTER MEDICATION.  No orders of the defined types were placed in this encounter.    Follow-up: Return in about 3 months (around 03/08/2019).  Scarlette Calico, MD

## 2018-12-07 ENCOUNTER — Encounter: Payer: Self-pay | Admitting: Internal Medicine

## 2018-12-07 DIAGNOSIS — D508 Other iron deficiency anemias: Secondary | ICD-10-CM

## 2018-12-07 DIAGNOSIS — M8000XA Age-related osteoporosis with current pathological fracture, unspecified site, initial encounter for fracture: Secondary | ICD-10-CM | POA: Insufficient documentation

## 2018-12-07 HISTORY — DX: Other iron deficiency anemias: D50.8

## 2018-12-08 ENCOUNTER — Encounter: Payer: Self-pay | Admitting: Internal Medicine

## 2018-12-09 LAB — VITAMIN B1: Vitamin B1 (Thiamine): 17 nmol/L (ref 8–30)

## 2018-12-09 LAB — RETICULOCYTES
ABS Retic: 54880 cells/uL (ref 25000–9000)
Retic Ct Pct: 1.4 %

## 2018-12-12 ENCOUNTER — Ambulatory Visit (HOSPITAL_COMMUNITY)
Admission: RE | Admit: 2018-12-12 | Discharge: 2018-12-12 | Disposition: A | Discharge status: Home / Self Care | Payer: Medicare Other | Source: Ambulatory Visit | Attending: Internal Medicine | Admitting: Internal Medicine

## 2018-12-12 ENCOUNTER — Other Ambulatory Visit: Payer: Self-pay

## 2018-12-12 DIAGNOSIS — D508 Other iron deficiency anemias: Secondary | ICD-10-CM | POA: Diagnosis not present

## 2018-12-12 MED ORDER — SODIUM CHLORIDE 0.9 % IV SOLN
INTRAVENOUS | Status: DC | PRN
  Administered 2018-12-12: 08:00:00 250 mL via INTRAVENOUS

## 2018-12-12 MED ORDER — SODIUM CHLORIDE 0.9 % IV SOLN
750.0000 mg | Freq: Once | INTRAVENOUS | Status: AC
  Administered 2018-12-12: 09:00:00 750 mg via INTRAVENOUS
  Filled 2018-12-12: qty 15

## 2018-12-12 NOTE — Progress Notes (Signed)
PATIENT CARE CENTER NOTE  Diagnosis: Iron deficiency anemia due to dietary causes (D50.8)   Provider: Scarlette Calico, MD   Procedure: IV Injectafer   Note: Patient received Injectafer infusion via PIV. Tolerated well with no adverse reaction. Observed patient for 30 minutes post-infusion. Vital signs stable. Discharge instructions given. Patient to come back next week for second infusion. Alert, oriented and ambulatory at discharge.

## 2018-12-12 NOTE — Discharge Instructions (Signed)

## 2018-12-13 ENCOUNTER — Telehealth: Payer: Self-pay

## 2018-12-13 NOTE — Telephone Encounter (Signed)
Copied from [place] [id number]. Topic: General - Other >> Dec 13, 2018 10:30 AM Antonieta Iba C wrote: Reason for CRM: pt called in to be advised. Pt says that he was recommended to have prolia injection due to back pain by [facility].   Please assist.  Routing to dr [name], patient had bone density test on 12/07/18-----tscore is 2.7-----calcium and vitamin D levels are normal----are you ok with starting prolia injections, I will verify patient's insurance and call him to discuss summary of benefits---please advise, thanks

## 2018-12-13 NOTE — Telephone Encounter (Signed)
Yes, start prolia  TJ

## 2018-12-14 NOTE — Telephone Encounter (Signed)
I have talked with patient about prolia shots/risks/copays,etc---I will be verifying insurance for summary of benefits, then call patient back to discuss---patient would like to get first shot at office visit with dr [name] in November, 2020 (already scheduled) if we hear back from insurance company in time

## 2018-12-18 ENCOUNTER — Telehealth: Payer: Self-pay | Admitting: Oncology

## 2018-12-18 NOTE — Telephone Encounter (Signed)
Returned call to patient re rescheduling 11/11 follow up. Confirmed new appointment for 11/27 with patient.

## 2018-12-19 ENCOUNTER — Other Ambulatory Visit: Payer: Self-pay

## 2018-12-19 ENCOUNTER — Ambulatory Visit (HOSPITAL_COMMUNITY)
Admission: RE | Admit: 2018-12-19 | Discharge: 2018-12-19 | Disposition: A | Discharge status: Home / Self Care | Payer: Medicare Other | Source: Ambulatory Visit | Attending: Internal Medicine | Admitting: Internal Medicine

## 2018-12-19 DIAGNOSIS — D508 Other iron deficiency anemias: Secondary | ICD-10-CM | POA: Diagnosis not present

## 2018-12-19 MED ORDER — SODIUM CHLORIDE 0.9 % IV SOLN
750.0000 mg | Freq: Once | INTRAVENOUS | Status: AC
  Administered 2018-12-19: 750 mg via INTRAVENOUS
  Filled 2018-12-19: qty 15

## 2018-12-19 MED ORDER — SODIUM CHLORIDE 0.9 % IV SOLN
INTRAVENOUS | Status: DC | PRN
  Administered 2018-12-19: 250 mL via INTRAVENOUS

## 2018-12-19 NOTE — Discharge Instructions (Signed)

## 2018-12-19 NOTE — Progress Notes (Signed)
PATIENT CARE CENTER NOTE  Diagnosis: Iron deficiency anemia due to dietary causes (D50.8)   Provider: Scarlette Calico, MD   Procedure: IV Injectafer    Note: Patient received Injectafer infusion via PIV. Tolerated well with no adverse reaction. Observed patient for 30 minutes post-infusion. Vital signs stable. Discharge instructions given.  Alert, oriented and ambulatory at discharge.

## 2018-12-20 NOTE — Telephone Encounter (Signed)
Patient has been submitted for insurance approval for Prolia injection through the Prolia Portal.  °Waiting on benefits. °Will contact patient once these have been received. °

## 2018-12-26 DIAGNOSIS — M4856XD Collapsed vertebra, not elsewhere classified, lumbar region, subsequent encounter for fracture with routine healing: Secondary | ICD-10-CM | POA: Diagnosis not present

## 2018-12-26 DIAGNOSIS — M4856XA Collapsed vertebra, not elsewhere classified, lumbar region, initial encounter for fracture: Secondary | ICD-10-CM | POA: Insufficient documentation

## 2018-12-26 DIAGNOSIS — S22000A Wedge compression fracture of unspecified thoracic vertebra, initial encounter for closed fracture: Secondary | ICD-10-CM | POA: Insufficient documentation

## 2019-01-03 ENCOUNTER — Other Ambulatory Visit: Payer: Self-pay | Admitting: Cardiovascular Disease

## 2019-01-03 ENCOUNTER — Other Ambulatory Visit: Payer: Self-pay | Admitting: Internal Medicine

## 2019-01-03 DIAGNOSIS — E781 Pure hyperglyceridemia: Secondary | ICD-10-CM

## 2019-01-03 MED ORDER — APIXABAN 5 MG PO TABS: 5.0000 mg | ORAL_TABLET | Freq: Two times a day (BID) | ORAL | 10 refills | Status: DC

## 2019-01-03 NOTE — Telephone Encounter (Signed)
Eliquis 5mg  refill request received. Pt is 78yrs old, weight-79.8kg, Crea-1.26 on 12/06/2018, Diagnosis-Afib, and last seen by Dr. Rayann Heman on 11/22/2018 & Dr. Acie Fredrickson on 04/10/2018. Dose is appropriate based on dosing criteria. Will send in refill to requested pharmacy.

## 2019-01-04 ENCOUNTER — Telehealth: Payer: Self-pay | Admitting: Internal Medicine

## 2019-01-04 NOTE — Telephone Encounter (Signed)
Pt states that he is in the donut hole and his solifenacin (VESICARE) 10 MG tablet is going to be $1300/month. Pt wants to know if he can get samples for the remainder of the year or if he needs to be switched to something else less expensive.

## 2019-01-05 NOTE — Telephone Encounter (Signed)
Contacted pt - Informed that there are PAP for pts to help with the cost of medications. After speaking to the pt is sounds like he has tried before but made to much money to qualify. Spoke to pt and spouse about looking to additional rx coverage to help with the additional cost of the medications. I looked at [facility] and was able to find the Vesicare for about $18 at [facility]. Spouse stated that she had access to a computer and will look it that. I will send erx to [facility] if they decide to use the Goodrx coupon.

## 2019-01-08 ENCOUNTER — Telehealth: Payer: Self-pay | Admitting: Internal Medicine

## 2019-01-08 MED ORDER — SOLIFENACIN SUCCINATE 10 MG PO TABS: 10.0000 mg | ORAL_TABLET | Freq: Every day | ORAL | 1 refills | Status: DC

## 2019-01-08 NOTE — Telephone Encounter (Signed)
Copied from [place] #[id number]. Topic: Quick Communication - Rx Refill/Question >> Jan 08, 2019  8:48 AM Carolyn Stare wrote: Medication solifenacin (VESICARE) 10 MG tablet  Preferred Pharmacy [facility]   Agent: Please be advised that RX refills may take up to 3 business days. We ask that you follow-up with your pharmacy.

## 2019-01-08 NOTE — Telephone Encounter (Signed)
erx has been sent as requested. Pt spouse informed of same.

## 2019-01-10 ENCOUNTER — Ambulatory Visit: Payer: Medicare Other | Admitting: Oncology

## 2019-01-12 NOTE — Progress Notes (Addendum)
Subjective:   [name] is a 78 y.o. male who presents for Medicare Annual/Subsequent preventive examination.  Review of Systems:     Sleep patterns: feels rested on waking, gets up 2 times nightly to void and sleeps 7-8 hours nightly.    Home Safety/Smoke Alarms: Feels safe in home. Smoke alarms in place.  Living environment; residence and Firearm Safety: 1-story house/ trailer. Lives with husband, no needs for DME, good support system Seat Belt Safety/Bike Helmet: Wears seat belt.      Objective:    Vitals: BP 132/71   Pulse 65   Temp 98.3 F (36.8 C)   Resp 17   Ht 5' 3.5" (1.613 m)   Wt 175 lb (79.4 kg)   SpO2 98%   BMI 30.51 kg/m   Body mass index is 30.51 kg/m.  Advanced Directives 01/15/2019 10/18/2018 10/11/2018 08/14/2018 07/18/2018 07/18/2018 06/24/2018  Does Patient Have a Medical Advance Directive? Yes Yes Yes Yes Yes Yes Yes  Type of Paramedic of [place];Living will [place];Living will [place];Living will [place];Living will Healthcare Power of [place];Living will [place];Living will  Does patient want to make changes to medical advance directive? - No - Patient declined No - Patient declined No - Patient declined No - Patient declined - No - Patient declined  Copy of [place] in Chart? No - copy requested No - copy requested No - copy requested No - copy requested No - copy requested No - copy requested No - copy requested  Would patient like information on creating a medical advance directive? - - - - - - -  Pre-existing out of facility DNR order (yellow form or pink MOST form) - - - - - - -    Tobacco Social History   Tobacco Use  Smoking Status Former Smoker  . Quit date: 03/02/1987  . Years since quitting: 31.8  Smokeless Tobacco Never Used     Counseling given: Not Answered  Past Medical  History:  Diagnosis Date  . Diverticulosis   . Esophageal reflux   . Esophageal stricture   . HA (headache)   . Hearing loss   . Hiatal hernia   . Hypertriglyceridemia   . Hypoglycemia   . Obesity, unspecified   . PAF (paroxysmal atrial fibrillation) (HCC)    Echo with normal LV function and mild LVH  . Presence of permanent cardiac pacemaker   . Renal mass    left  . Syncope    presented with atrial fib converted with Diltiazem  . Systemic inflammatory response syndrome ([place]) 06/26/2018   Past Surgical History:  Procedure Laterality Date  . CYSTOSCOPY WITH URETEROSCOPY AND STENT PLACEMENT Left 08/16/2018   Procedure: CYSTOSCOPY WITH LEFT RETROGRADE PYELOGRAM, BALLOON DILATION LEFT URETER, STENT PLACEMENT;  Surgeon: Lucas Mallow, MD;  Location: [facility];  Service: Urology;  Laterality: Left;  . HERNIA REPAIR    . INSERT / REPLACE / REMOVE PACEMAKER    . LAPAROSCOPIC ASSISTED VENTRAL HERNIA REPAIR N/A 10/18/2018   Procedure: LAPAROSCOPIC ASSISTED VENTRAL WALL HERNIA REPAIR WITH MESH;  Surgeon: Michael Boston, MD;  Location: [facility];  Service: General;  Laterality: N/A;  . LAPAROSCOPIC NEPHRECTOMY, HAND ASSISTED Left 10/18/2018   Procedure: LEFT HAND ASSISTED LAPAROSCOPIC RADICAL NEPHRECTOMY WITH ADRENALECTOMY;  Surgeon: Lucas Mallow, MD;  Location: [facility];  Service: Urology;  Laterality: Left;  .  LOOP RECORDER INSERTION N/A 04/26/2018   Procedure: LOOP RECORDER INSERTION;  Surgeon: Thompson Grayer, MD;  Location: [facility];  Service: Cardiovascular;  Laterality: N/A;  . LOOP RECORDER REMOVAL N/A 07/18/2018   Procedure: LOOP RECORDER REMOVAL;  Surgeon: Thompson Grayer, MD;  Location: [facility];  Service: Cardiovascular;  Laterality: N/A;  . NASAL SEPTUM SURGERY    . PACEMAKER IMPLANT N/A 07/18/2018   Procedure: PACEMAKER IMPLANT;  Surgeon: Thompson Grayer, MD;  Location: [facility];  Service: Cardiovascular;  Laterality: N/A;  . TONSILLECTOMY    .  TONSILLECTOMY    . UMBILICAL HERNIA REPAIR  1993  . WISDOM TOOTH EXTRACTION     about 78 years old   Family History  Problem Relation Age of Onset  . Emphysema Father   . Gallbladder disease Sister   . Bipolar disorder Sister   . Bipolar disorder Paternal Grandmother   . Colon cancer Neg Hx    Social History   Socioeconomic History  . Marital status: Married    Spouse name: Not on file  . Number of children: 1  . Years of education: Not on file  . Highest education level: Not on file  Occupational History  . 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 Financial resource strain: Not hard at all  . Food insecurity    Worry: Never true    Inability: Never true  . Transportation needs    Medical: No    Non-medical: No  Tobacco Use  . Smoking status: Former Smoker    Quit date: 03/02/1987    Years since quitting: 31.8  . Smokeless tobacco: Never Used  Substance and Sexual Activity  . Alcohol use: No    Alcohol/week: 0.0 standard drinks  . Drug use: No  . Sexual activity: Not Currently  Lifestyle  . Physical activity    Days per week: 0 days    Minutes per session: 0 min  . Stress: Not at all  Relationships  . Social connections    Talks on phone: More than three times a week    Gets together: More than three times a week    Attends religious service: 1 to 4 times per year    Active member of club or organization: Yes    Attends meetings of clubs or organizations: More than 4 times per year    Relationship status: Married  Other Topics Concern  . Not on file  Social History Narrative  . Not on file    Outpatient Encounter Medications as of 01/15/2019  Medication Sig  . apixaban (ELIQUIS) 5 MG TABS tablet Take 1 tablet (5 mg total) by mouth 2 (two) times daily.  Marland Kitchen esomeprazole (NEXIUM) 40 MG capsule Take 1 capsule (40 mg total) by mouth daily. (Patient taking differently: Take 40 mg by mouth daily before breakfast. )  . naproxen sodium (ALEVE) 220 MG  tablet Take 550 mg by mouth daily as needed (pain).  Marland Kitchen omega-3 acid ethyl esters (LOVAZA) 1 g capsule Take 2 capsules by mouth twice daily  . OVER THE COUNTER MEDICATION Take 1 tablet by mouth at bedtime as needed (sleep). Midnight otc sleep aid  . phenylephrine (4-WAY FAST ACTING) 1 % nasal spray Place 1 drop into both nostrils every 6 (six) hours as needed for congestion.  . solifenacin (VESICARE) 10 MG tablet Take 1 tablet (10 mg total) by mouth daily.  . [DISCONTINUED] apixaban (ELIQUIS) 5 MG TABS tablet Eliquis 5 mg  tablet   No facility-administered encounter medications on file as of 01/15/2019.     Activities of Daily Living In your present state of health, do you have any difficulty performing the following activities: 01/15/2019 10/18/2018  Hearing? N N  Comment - -  Vision? N N  Difficulty concentrating or making decisions? N N  Walking or climbing stairs? N N  Dressing or bathing? N N  Doing errands, shopping? N N  Preparing Food and eating ? N -  Using the Toilet? N -  In the past six months, have you accidently leaked urine? N -  Do you have problems with loss of bowel control? N -  Managing your Medications? N -  Managing your Finances? N -  Housekeeping or managing your Housekeeping? N -  Some recent data might be hidden    Patient Care Team: [name], MD as PCP - General Nahser, Wonda Cheng, MD as PCP - Cardiology (Cardiology) Irene Shipper, MD as Consulting Physician (Gastroenterology) Michael Boston, MD as Consulting Physician (General Surgery) Thompson Grayer, MD as Consulting Physician (Cardiology) Lucas Mallow, MD as Consulting Physician (Urology) Wyatt Portela, MD as Consulting Physician (Oncology)   Assessment:   This is a routine wellness examination for [facility]. Physical assessment deferred to PCP.  Exercise Activities and Dietary recommendations   Diet (meal preparation, eat out, water intake, caffeinated beverages, dairy products, fruits and  vegetables): in general, a "healthy" diet   expresses decrease appetite  Encouraged patient to increase daily water and healthy fluid intake.  Discussed supplementing with Ensure, samples and coupons provided.  Goals    . Patient Stated     Stay as healthy and as independent as possible.       Fall Risk Fall Risk  01/15/2019 01/15/2018 01/11/2018 01/10/2017 02/06/2016  Falls in the past year? 0 0 0 No No  Comment - - - - Emmi Telephone Survey: data to providers prior to load  Number falls in past yr: 0 - - - -  Injury with Fall? 0 - - - -   Is the patient's home free of loose throw rugs in walkways, pet beds, electrical cords, etc?   yes      Grab bars in the bathroom? yes      Handrails on the stairs?   yes      Adequate lighting?   yes   Depression Screen PHQ 2/9 Scores 01/15/2019 01/15/2018 01/11/2018 01/10/2017  PHQ - 2 Score 2 0 0 0  PHQ- 9 Score - - 0 -    Cognitive Function MMSE - Mini Mental State Exam 01/11/2018  Not completed: Refused       Ad8 score reviewed for issues:  Issues making decisions: no  Less interest in hobbies / activities: no  Repeats questions, stories (family complaining): no  Trouble using ordinary gadgets (microwave, computer, phone):no  Forgets the month or year: no  Mismanaging finances: no  Remembering appts: no  Daily problems with thinking and/or memory: no Ad8 score is= 0  Immunization History  Administered Date(s) Administered  . Fluad Quad(high Dose 65+) 12/06/2018  . Influenza Whole 01/19/2010  . Influenza, High Dose Seasonal PF 01/06/2016, 01/10/2017, 01/11/2018  . Influenza,inj,Quad PF,6+ Mos 11/28/2012, 12/03/2013, 12/25/2014  . Pneumococcal Conjugate-13 11/28/2012  . Pneumococcal Polysaccharide-23 12/03/2013  . Td 03/01/2006  . Tdap 04/07/2016  . Zoster 03/30/2013  . Zoster Recombinat (Shingrix) 10/18/2017, 05/09/2018   Screening Tests Health Maintenance  Topic Date Due  .  COLONOSCOPY  08/09/2021  .  TETANUS/TDAP  04/07/2026  . INFLUENZA VACCINE  Completed  . PNA vac Low Risk Adult  Completed        Plan:    Reviewed health maintenance screenings with patient today and relevant education, vaccines, and/or referrals were provided.   I have personally reviewed and noted the following in the patient's chart:   . Medical and social history . Use of alcohol, tobacco or illicit drugs  . Current medications and supplements . Functional ability and status . Nutritional status . Physical activity . Advanced directives . List of other physicians . Vitals . Screenings to include cognitive, depression, and falls . Referrals and appointments  In addition, I have reviewed and discussed with patient certain preventive protocols, quality metrics, and best practice recommendations. A written personalized care plan for preventive services as well as general preventive health recommendations were provided to patient.     Michiel Cowboy, RN  01/15/2019  Medical screening examination/treatment/procedure(s) were performed by non-physician practitioner and as supervising physician I was immediately available for consultation/collaboration. I agree with above. Scarlette Calico, MD

## 2019-01-15 ENCOUNTER — Encounter: Payer: Self-pay | Admitting: Internal Medicine

## 2019-01-15 ENCOUNTER — Ambulatory Visit (INDEPENDENT_AMBULATORY_CARE_PROVIDER_SITE_OTHER): Payer: Medicare Other | Admitting: Internal Medicine

## 2019-01-15 ENCOUNTER — Other Ambulatory Visit (INDEPENDENT_AMBULATORY_CARE_PROVIDER_SITE_OTHER): Payer: Medicare Other

## 2019-01-15 ENCOUNTER — Ambulatory Visit (INDEPENDENT_AMBULATORY_CARE_PROVIDER_SITE_OTHER): Payer: Medicare Other | Admitting: *Deleted

## 2019-01-15 ENCOUNTER — Other Ambulatory Visit: Payer: Self-pay

## 2019-01-15 VITALS — BP 132/71 | HR 65 | Temp 98.3°F | Resp 17 | Ht 63.5 in | Wt 175.0 lb

## 2019-01-15 VITALS — BP 132/70 | HR 65 | Temp 98.0°F | Ht 63.5 in | Wt 175.0 lb

## 2019-01-15 DIAGNOSIS — N400 Enlarged prostate without lower urinary tract symptoms: Secondary | ICD-10-CM | POA: Diagnosis not present

## 2019-01-15 DIAGNOSIS — Z0000 Encounter for general adult medical examination without abnormal findings: Secondary | ICD-10-CM

## 2019-01-15 DIAGNOSIS — Z0001 Encounter for general adult medical examination with abnormal findings: Secondary | ICD-10-CM

## 2019-01-15 DIAGNOSIS — D508 Other iron deficiency anemias: Secondary | ICD-10-CM

## 2019-01-15 DIAGNOSIS — M8080XD Other osteoporosis with current pathological fracture, unspecified site, subsequent encounter for fracture with routine healing: Secondary | ICD-10-CM

## 2019-01-15 DIAGNOSIS — C642 Malignant neoplasm of left kidney, except renal pelvis: Secondary | ICD-10-CM | POA: Insufficient documentation

## 2019-01-15 DIAGNOSIS — M8000XD Age-related osteoporosis with current pathological fracture, unspecified site, subsequent encounter for fracture with routine healing: Secondary | ICD-10-CM

## 2019-01-15 DIAGNOSIS — E785 Hyperlipidemia, unspecified: Secondary | ICD-10-CM

## 2019-01-15 DIAGNOSIS — C649 Malignant neoplasm of unspecified kidney, except renal pelvis: Secondary | ICD-10-CM | POA: Insufficient documentation

## 2019-01-15 HISTORY — DX: Malignant neoplasm of left kidney, except renal pelvis: C64.2

## 2019-01-15 LAB — CBC WITH DIFFERENTIAL/PLATELET
Basophils Absolute: 0 10*3/uL (ref 0.0–0.1)
Basophils Relative: 0.4 % (ref 0.0–3.0)
Eosinophils Absolute: 0 10*3/uL (ref 0.0–0.7)
Eosinophils Relative: 0.7 % (ref 0.0–5.0)
HCT: 42.5 % (ref 39.0–52.0)
Hemoglobin: 14.4 g/dL (ref 13.0–17.0)
Lymphocytes Relative: 18 % (ref 12.0–46.0)
Lymphs Abs: 1.2 10*3/uL (ref 0.7–4.0)
MCHC: 33.8 g/dL (ref 30.0–36.0)
MCV: 92.3 fl (ref 78.0–100.0)
Monocytes Absolute: 0.4 10*3/uL (ref 0.1–1.0)
Monocytes Relative: 6.2 % (ref 3.0–12.0)
Neutro Abs: 4.8 10*3/uL (ref 1.4–7.7)
Neutrophils Relative %: 74.7 % (ref 43.0–77.0)
Platelets: 172 10*3/uL (ref 150.0–400.0)
RBC: 4.61 Mil/uL (ref 4.22–5.81)
RDW: 21.9 % — ABNORMAL HIGH (ref 11.5–15.5)
WBC: 6.5 10*3/uL (ref 4.0–10.5)

## 2019-01-15 LAB — LIPID PANEL
Cholesterol: 183 mg/dL (ref 0–200)
HDL: 30.8 mg/dL — ABNORMAL LOW (ref >39.00)
LDL Cholesterol: 113 mg/dL — ABNORMAL HIGH (ref 0–99)
NonHDL: 152
Total CHOL/HDL Ratio: 6
Triglycerides: 196 mg/dL — ABNORMAL HIGH (ref 0.0–149.0)
VLDL: 39.2 mg/dL (ref 0.0–40.0)

## 2019-01-15 LAB — IBC PANEL
Iron: 107 ug/dL (ref 42–165)
Saturation Ratios: 39.2 % (ref 20.0–50.0)
Transferrin: 195 mg/dL — ABNORMAL LOW (ref 212.0–360.0)

## 2019-01-15 LAB — PSA: PSA: 0.2 ng/mL (ref 0.10–4.00)

## 2019-01-15 LAB — FERRITIN: Ferritin: 312.7 ng/mL (ref 22.0–322.0)

## 2019-01-15 MED ORDER — RISEDRONATE SODIUM 35 MG PO TABS: 35.0000 mg | ORAL_TABLET | ORAL | 1 refills | Status: DC

## 2019-01-15 MED ORDER — ROSUVASTATIN CALCIUM 10 MG PO TABS: 10.0000 mg | ORAL_TABLET | Freq: Every day | ORAL | 1 refills | Status: DC

## 2019-01-15 NOTE — Progress Notes (Addendum)
Subjective:  Patient ID: Frank Hoover., male    DOB: Jul 10, 1940  Age: 78 y.o. MRN: [id number]  CC: Annual Exam, Anemia, and Hyperlipidemia  This visit occurred during the SARS-CoV-2 public health emergency.  Safety protocols were in place, including screening questions prior to the visit, additional usage of staff PPE, and extensive cleaning of exam room while observing appropriate contact time as indicated for disinfecting solutions.    HPI Frank Tomaro. presents for a CPX.  He has not started using Prolia yet because it is too expensive.  He also tells me he is not willing to do a yearly IV medication like Reclast.  He has seen a back specialist and has been told that the lumbar fracture is healing without complications.  He is not currently taking anything for pain.  Outpatient Medications Prior to Visit  Medication Sig Dispense Refill  . apixaban (ELIQUIS) 5 MG TABS tablet Take 1 tablet (5 mg total) by mouth 2 (two) times daily. 60 tablet 10  . esomeprazole (NEXIUM) 40 MG capsule Take 1 capsule (40 mg total) by mouth daily. (Patient taking differently: Take 40 mg by mouth daily before breakfast. ) 30 capsule 11  . omega-3 acid ethyl esters (LOVAZA) 1 g capsule Take 2 capsules by mouth twice daily 360 capsule 1  . solifenacin (VESICARE) 10 MG tablet Take 1 tablet (10 mg total) by mouth daily. 90 tablet 1  . naproxen sodium (ALEVE) 220 MG tablet Take 550 mg by mouth daily as needed (pain).    Marland Kitchen OVER THE COUNTER MEDICATION Take 1 tablet by mouth at bedtime as needed (sleep). Midnight otc sleep aid    . phenylephrine (4-WAY FAST ACTING) 1 % nasal spray Place 1 drop into both nostrils every 6 (six) hours as needed for congestion.     No facility-administered medications prior to visit.     ROS Review of Systems  Constitutional: Negative for appetite change, diaphoresis and fatigue.  HENT: Negative.  Negative for trouble swallowing.   Eyes: Negative for visual disturbance.   Respiratory: Negative for cough, chest tightness, shortness of breath and wheezing.   Cardiovascular: Negative for chest pain, palpitations and leg swelling.  Gastrointestinal: Negative for abdominal pain, constipation, diarrhea, nausea and vomiting.  Endocrine: Negative.   Genitourinary: Negative.  Negative for difficulty urinating, dysuria and hematuria.  Musculoskeletal: Positive for back pain. Negative for arthralgias, myalgias and neck pain.  Skin: Negative.  Negative for color change, pallor and rash.  Neurological: Negative.  Negative for dizziness, weakness and light-headedness.  Hematological: Negative for adenopathy. Does not bruise/bleed easily.  Psychiatric/Behavioral: Negative.     Objective:  BP 132/70 (BP Location: Left Arm, Patient Position: Sitting, Cuff Size: Normal)   Pulse 65   Temp 98 F (36.7 C) (Oral)   Ht 5' 3.5" (1.613 m)   Wt 175 lb (79.4 kg)   SpO2 98%   BMI 30.51 kg/m   BP Readings from Last 3 Encounters:  01/15/19 132/70  01/15/19 132/71  12/19/18 121/76    Wt Readings from Last 3 Encounters:  01/15/19 175 lb (79.4 kg)  01/15/19 175 lb (79.4 kg)  12/06/18 176 lb (79.8 kg)    Physical Exam Vitals signs reviewed. Exam conducted with a chaperone present.  Constitutional:      Appearance: Normal appearance.  HENT:     Nose: Nose normal.     Mouth/Throat:     Mouth: Mucous membranes are moist.  Eyes:     General:  No scleral icterus.    Conjunctiva/sclera: Conjunctivae normal.  Neck:     Musculoskeletal: Neck supple.  Cardiovascular:     Rate and Rhythm: Normal rate and regular rhythm.     Heart sounds: No murmur.  Pulmonary:     Effort: Pulmonary effort is normal.     Breath sounds: No wheezing, rhonchi or rales.  Abdominal:     General: Abdomen is flat. Bowel sounds are normal.     Palpations: There is no hepatomegaly, splenomegaly or mass.     Tenderness: There is no abdominal tenderness.     Hernia: There is no hernia in the left  inguinal area or right inguinal area.  Genitourinary:    Pubic Area: No rash.      Penis: Normal and circumcised. No discharge, swelling or lesions.      Scrotum/Testes: Normal.        Right: Mass, tenderness or swelling not present.        Left: Mass, tenderness or swelling not present.     Epididymis:     Right: Normal. Not inflamed or enlarged. No mass.     Left: Normal. Not inflamed or enlarged. No mass.     Prostate: Enlarged (1+ smooth symm BPH). Not tender and no nodules present.     Rectum: Normal. Guaiac result negative. No mass, tenderness, anal fissure, external hemorrhoid or internal hemorrhoid. Normal anal tone.  Musculoskeletal: Normal range of motion.     Right lower leg: No edema.     Left lower leg: No edema.  Lymphadenopathy:     Cervical: No cervical adenopathy.     Lower Body: No right inguinal adenopathy. No left inguinal adenopathy.  Skin:    General: Skin is warm and dry.     Coloration: Skin is not pale.  Neurological:     General: No focal deficit present.     Mental Status: He is alert.  Psychiatric:        Mood and Affect: Mood normal.        Behavior: Behavior normal.     Lab Results  Component Value Date   WBC 6.5 01/15/2019   HGB 14.4 01/15/2019   HCT 42.5 01/15/2019   PLT 172.0 01/15/2019   GLUCOSE 120 (H) 12/06/2018   CHOL 183 01/15/2019   TRIG 196.0 (H) 01/15/2019   HDL 30.80 (L) 01/15/2019   LDLDIRECT 91.0 04/23/2015   LDLCALC 113 (H) 01/15/2019   ALT 30 06/27/2018   AST 19 06/27/2018   NA 136 12/06/2018   K 3.8 12/06/2018   CL 104 12/06/2018   CREATININE 1.26 12/06/2018   BUN 24 (H) 12/06/2018   CO2 21 12/06/2018   TSH 2.01 01/11/2018   PSA 0.20 01/15/2019   INR 1.2 10/11/2018   HGBA1C 6.2 12/06/2018    No results found.  Assessment & Plan:   Frank [name] was seen today for annual exam, anemia and hyperlipidemia.  Diagnoses and all orders for this visit:  Routine general medical examination at a health care facility- Exam  completed, labs reviewed, vaccines reviewed and updated, cancer screenings are all up-to-date, patient education was given. -     PSA; Future -     Lipid panel; Future  Benign prostatic hyperplasia without lower urinary tract symptoms- His PSA is low which is reassuring that he does not have prostate cancer.  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  Iron deficiency anemia due to dietary causes- His H&H are normal  now status post iron infusions. -     CBC with Differential; Future -     IBC panel; Future -     Ferritin; Future  Localized osteoporosis with current pathological fracture with routine healing, subsequent encounter -     risedronate (ACTONEL) 35 MG tablet; Take 1 tablet (35 mg total) by mouth every 7 (seven) days. with water on empty stomach, nothing by mouth or lie down for next 30 minutes.  Age-related osteoporosis with current pathological fracture with routine healing, subsequent encounter -     risedronate (ACTONEL) 35 MG tablet; Take 1 tablet (35 mg total) by mouth every 7 (seven) days. with water on empty stomach, nothing by mouth or lie down for next 30 minutes.  Hyperlipidemia with target LDL less than 70- He has an elevated ASCVD risk score so I have asked him to take a statin for CV risk reduction. -     rosuvastatin (CRESTOR) 10 MG tablet; Take 1 tablet (10 mg total) by mouth daily.   I have discontinued Frank Hoover. "Frank [name]"'s 4-Way Fast Acting, naproxen sodium, and OVER THE COUNTER MEDICATION. I am also having him start on risedronate and rosuvastatin. Additionally, I am having him maintain his esomeprazole, omega-3 acid ethyl esters, apixaban, and solifenacin.  Meds ordered this encounter  Medications  . risedronate (ACTONEL) 35 MG tablet    Sig: Take 1 tablet (35 mg total) by mouth every 7 (seven) days. with water on empty stomach, nothing by mouth or lie down for next 30 minutes.    Dispense:  12 tablet    Refill:  1   . rosuvastatin (CRESTOR) 10 MG tablet    Sig: Take 1 tablet (10 mg total) by mouth daily.    Dispense:  90 tablet    Refill:  1     Follow-up: Return in about 6 months (around 07/15/2019).  Scarlette Calico, MD

## 2019-01-15 NOTE — Patient Instructions (Signed)

## 2019-01-15 NOTE — Patient Instructions (Signed)
Continue doing brain stimulating activities (puzzles, reading, adult coloring books, staying active) to keep memory sharp.   Continue to eat heart healthy diet (full of fruits, vegetables, whole grains, lean protein, water--limit salt, fat, and sugar intake) and increase physical activity as tolerated.   [name] , Thank you for taking time to come for your Medicare Wellness Visit. I appreciate your ongoing commitment to your health goals. Please review the following plan we discussed and let me know if I can assist you in the future.   These are the goals we discussed: Goals    . Patient Stated     Stay as healthy and as independent as possible.       This is a list of the screening recommended for you and due dates:  Health Maintenance  Topic Date Due  . Colon Cancer Screening  08/09/2021  . Tetanus Vaccine  04/07/2026  . Flu Shot  Completed  . Pneumonia vaccines  Completed    Preventive Care 52 Years and Older, Male Preventive care refers to lifestyle choices and visits with your health care provider that can promote health and wellness. This includes:  A yearly physical exam. This is also called an annual well check.  Regular dental and eye exams.  Immunizations.  Screening for certain conditions.  Healthy lifestyle choices, such as diet and exercise. What can I expect for my preventive care visit? Physical exam Your health care provider will check:  Height and weight. These may be used to calculate body mass index (BMI), which is a measurement that tells if you are at a healthy weight.  Heart rate and blood pressure.  Your skin for abnormal spots. Counseling Your health care provider may ask you questions about:  Alcohol, tobacco, and drug use.  Emotional well-being.  Home and relationship well-being.  Sexual activity.  Eating habits.  History of falls.  Memory and ability to understand (cognition).  Work and work Statistician. What immunizations  do I need?  Influenza (flu) vaccine  This is recommended every year. Tetanus, diphtheria, and pertussis (Tdap) vaccine  You may need a Td booster every 10 years. Varicella (chickenpox) vaccine  You may need this vaccine if you have not already been vaccinated. Zoster (shingles) vaccine  You may need this after age 46. Pneumococcal conjugate (PCV13) vaccine  One dose is recommended after age 71. Pneumococcal polysaccharide (PPSV23) vaccine  One dose is recommended after age 36. Measles, mumps, and rubella (MMR) vaccine  You may need at least one dose of MMR if you were born in 1957 or later. You may also need a second dose. Meningococcal conjugate (MenACWY) vaccine  You may need this if you have certain conditions. Hepatitis A vaccine  You may need this if you have certain conditions or if you travel or work in places where you may be exposed to hepatitis A. Hepatitis B vaccine  You may need this if you have certain conditions or if you travel or work in places where you may be exposed to hepatitis B. Haemophilus influenzae type b (Hib) vaccine  You may need this if you have certain conditions. You may receive vaccines as individual doses or as more than one vaccine together in one shot (combination vaccines). Talk with your health care provider about the risks and benefits of combination vaccines. What tests do I need? Blood tests  Lipid and cholesterol levels. These may be checked every 5 years, or more frequently depending on your overall health.  Hepatitis C  test.  Hepatitis B test. Screening  Lung cancer screening. You may have this screening every year starting at age 41 if you have a 30-pack-year history of smoking and currently smoke or have quit within the past 15 years.  Colorectal cancer screening. All adults should have this screening starting at age 3 and continuing until age 64. Your health care provider may recommend screening at age 46 if you are at  increased risk. You will have tests every 1-10 years, depending on your results and the type of screening test.  Prostate cancer screening. Recommendations will vary depending on your family history and other risks.  Diabetes screening. This is done by checking your blood sugar (glucose) after you have not eaten for a while (fasting). You may have this done every 1-3 years.  Abdominal aortic aneurysm (AAA) screening. You may need this if you are a current or former smoker.  Sexually transmitted disease (STD) testing. Follow these instructions at home: Eating and drinking  Eat a diet that includes fresh fruits and vegetables, whole grains, lean protein, and low-fat dairy products. Limit your intake of foods with high amounts of sugar, saturated fats, and salt.  Take vitamin and mineral supplements as recommended by your health care provider.  Do not drink alcohol if your health care provider tells you not to drink.  If you drink alcohol: ? Limit how much you have to 0-2 drinks a day. ? Be aware of how much alcohol is in your drink. In the [place] one drink equals one 12 oz bottle of beer (355 mL), one 5 oz glass of Shakiyah Cirilo (148 mL), or one 1 oz glass of hard liquor (44 mL). Lifestyle  Take daily care of your teeth and gums.  Stay active. Exercise for at least 30 minutes on 5 or more days each week.  Do not use any products that contain nicotine or tobacco, such as cigarettes, e-cigarettes, and chewing tobacco. If you need help quitting, ask your health care provider.  If you are sexually active, practice safe sex. Use a condom or other form of protection to prevent STIs (sexually transmitted infections).  Talk with your health care provider about taking a low-dose aspirin or statin. What's next?  Visit your health care provider once a year for a well check visit.  Ask your health care provider how often you should have your eyes and teeth checked.  Stay up to date on all vaccines.  This information is not intended to replace advice given to you by your health care provider. Make sure you discuss any questions you have with your health care provider. Document Released: 03/14/2015 Document Revised: 02/09/2018 Document Reviewed: 02/09/2018 Elsevier Patient Education  2020 [employer].

## 2019-01-16 ENCOUNTER — Telehealth: Payer: Self-pay | Admitting: Internal Medicine

## 2019-01-16 MED ORDER — ESOMEPRAZOLE MAGNESIUM 40 MG PO CPDR: 40.0000 mg | DELAYED_RELEASE_CAPSULE | Freq: Every day | ORAL | 1 refills | Status: DC

## 2019-01-16 NOTE — Telephone Encounter (Signed)
Refilled Esomeprazole

## 2019-01-17 ENCOUNTER — Ambulatory Visit (INDEPENDENT_AMBULATORY_CARE_PROVIDER_SITE_OTHER): Payer: Medicare Other | Admitting: *Deleted

## 2019-01-17 DIAGNOSIS — C642 Malignant neoplasm of left kidney, except renal pelvis: Secondary | ICD-10-CM | POA: Diagnosis not present

## 2019-01-17 DIAGNOSIS — I495 Sick sinus syndrome: Secondary | ICD-10-CM

## 2019-01-17 DIAGNOSIS — I480 Paroxysmal atrial fibrillation: Secondary | ICD-10-CM

## 2019-01-17 LAB — CUP PACEART REMOTE DEVICE CHECK
Battery Remaining Longevity: 171 mo
Battery Voltage: 3.15 V
Brady Statistic AP VP Percent: 0.02 %
Brady Statistic AP VS Percent: 34.05 %
Brady Statistic AS VP Percent: 0.01 %
Brady Statistic AS VS Percent: 65.92 %
Brady Statistic RA Percent Paced: 34.2 %
Brady Statistic RV Percent Paced: 0.04 %
Date Time Interrogation Session: 20201118030025
Implantable Lead Implant Date: 20200519
Implantable Lead Implant Date: 20200519
Implantable Lead Location: 753859
Implantable Lead Location: 753860
Implantable Lead Model: 5076
Implantable Lead Model: 5076
Implantable Pulse Generator Implant Date: 20200519
Lead Channel Impedance Value: 323 Ohm
Lead Channel Impedance Value: 399 Ohm
Lead Channel Impedance Value: 494 Ohm
Lead Channel Impedance Value: 646 Ohm
Lead Channel Pacing Threshold Amplitude: 0.5 V
Lead Channel Pacing Threshold Amplitude: 0.625 V
Lead Channel Pacing Threshold Pulse Width: 0.4 ms
Lead Channel Pacing Threshold Pulse Width: 0.4 ms
Lead Channel Sensing Intrinsic Amplitude: 4.25 mV
Lead Channel Sensing Intrinsic Amplitude: 4.25 mV
Lead Channel Sensing Intrinsic Amplitude: 9.75 mV
Lead Channel Sensing Intrinsic Amplitude: 9.75 mV
Lead Channel Setting Pacing Amplitude: 1.5 V
Lead Channel Setting Pacing Amplitude: 2.5 V
Lead Channel Setting Pacing Pulse Width: 0.4 ms
Lead Channel Setting Sensing Sensitivity: 2 mV

## 2019-01-22 ENCOUNTER — Telehealth: Payer: Self-pay | Admitting: Oncology

## 2019-01-22 DIAGNOSIS — R1110 Vomiting, unspecified: Secondary | ICD-10-CM | POA: Diagnosis not present

## 2019-01-22 DIAGNOSIS — C642 Malignant neoplasm of left kidney, except renal pelvis: Secondary | ICD-10-CM | POA: Diagnosis not present

## 2019-01-22 NOTE — Telephone Encounter (Signed)
Called per providers request, informed patient 11/27 appointment will be done virtually. Patient is notified.

## 2019-01-26 ENCOUNTER — Inpatient Hospital Stay: Payer: Medicare Other | Attending: Oncology | Admitting: Oncology

## 2019-01-26 ENCOUNTER — Inpatient Hospital Stay: Payer: Medicare Other | Admitting: Oncology

## 2019-01-26 DIAGNOSIS — C642 Malignant neoplasm of left kidney, except renal pelvis: Secondary | ICD-10-CM | POA: Insufficient documentation

## 2019-01-26 NOTE — Progress Notes (Signed)
Hematology and Oncology Follow Up for Telemedicine Visits  Frank [name] [id number] 27-Oct-1940 78 y.o. 01/26/2019 12:15 PM Frank [name], MDJones, Frank Right, MD   I connected with Frank [name] on 01/26/19 at 12:00 PM EST by video enabled telemedicine visit and verified that I am speaking with the correct person using two identifiers.   I discussed the limitations, risks, security and privacy concerns of performing an evaluation and management service by telemedicine and the availability of in-person appointments. I also discussed with the patient that there may be a patient responsible charge related to this service. The patient expressed understanding and agreed to proceed.  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   Prior Therapy:  He is status post left radical nephrectomy completed by Dr. Gloriann Loan on October 18, 2018.  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  Current therapy: Active surveillance.  Interim History: Frank [name] reports no major complaints in his health.  He was diagnosed with osteoporosis and compression fracture and currently on Actonel for it.  He did have a bone density scan October 7 which confirmed this diagnosis.  He does not report any specific symptoms at this time including no abdominal pain or discomfort.  Does report some mild back pain related to his compression fractures.  He is taking calcium and vitamin D supplements as well.    Medications: I have reviewed the patient's current medications.  Current Outpatient Medications  Medication Sig Dispense Refill  . apixaban (ELIQUIS) 5 MG TABS tablet Take 1 tablet (5 mg total) by  mouth 2 (two) times daily. 60 tablet 10  . esomeprazole (NEXIUM) 40 MG capsule Take 1 capsule (40 mg total) by mouth daily before breakfast. 90 capsule 1  . omega-3 acid ethyl esters (LOVAZA) 1 g capsule Take 2 capsules by mouth twice daily 360 capsule 1  . risedronate (ACTONEL) 35 MG tablet Take 1 tablet (35 mg total) by mouth every 7 (seven) days. with water on empty stomach, nothing by mouth or lie down for next 30 minutes. 12 tablet 1  . rosuvastatin (CRESTOR) 10 MG tablet Take 1 tablet (10 mg total) by mouth daily. 90 tablet 1  . solifenacin (VESICARE) 10 MG tablet Take 1 tablet (10 mg total) by mouth daily. 90 tablet 1   No current facility-administered medications for this visit.      Allergies:  Allergies  Allergen Reactions  . Hydrocodone     Caused confusion   . Monosodium Glutamate Other (See Comments)    Caused headaches  . Naproxen Sodium     Mouth blisters with prolonged use.  Left nephrectomy 2020  . Other     Perfume - headaches     Past Medical History, Surgical history, Social history, and Family History were reviewed and updated.     Lab Results: Lab Results  Component Value Date   WBC 6.5 01/15/2019   HGB 14.4 01/15/2019   HCT 42.5 01/15/2019   MCV 92.3 01/15/2019   PLT 172.0 01/15/2019     Chemistry      Component Value Date/Time   NA 136 12/06/2018 1430   NA 140 10/12/2017 0729   K 3.8 12/06/2018 1430  K 3.8 11/09/2013   CL 104 12/06/2018 1430   CL 105 11/09/2013   CO2 21 12/06/2018 1430   CO2 26 11/09/2013   BUN 24 (H) 12/06/2018 1430   BUN 13 10/12/2017 0729   CREATININE 1.26 12/06/2018 1430   CREATININE 0.66 11/09/2013      Component Value Date/Time   CALCIUM 9.4 12/06/2018 1430   CALCIUM 8.7 11/09/2013   ALKPHOS 45 06/27/2018 0618   ALKPHOS 55 11/09/2013   AST 19 06/27/2018 0618   AST 18 11/09/2013   ALT 30 06/27/2018 0618   BILITOT 1.0 06/27/2018 0618   BILITOT 0.5 10/12/2017 0729   BILITOT 0.6 11/09/2013        Radiological Studies:  IMPRESSION: 1. Status post left radical nephrectomy. Small postoperative fluid collection in the nephrectomy bed medial to the spleen which demonstrates some peripheral soft tissue thickening and enhancement. This may simply represent a resolving postoperative hematoma, however, given the soft tissue and enhancement the possibility of residual/recurrent disease is not entirely excluded (although no discrete nodular enhancing mass is identified at this time). Close attention on follow-up studies is recommended to ensure the stability or resolution of this finding. 2. 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 3. Aortic atherosclerosis, in addition to left main coronary artery disease. In addition, there is mild fusiform aneurysmal dilatation of the infrarenal abdominal aorta which has a mean diameter up to 3.0 cm. Recommend followup by ultrasound in 3 years. This recommendation follows ACR consensus guidelines: White Paper of the ACR Incidental Findings Committee II on Vascular Findings. J Am Coll Radiol 2013; 62:831-517 4. Progressive compression of T12 and L1 vertebral bodies compared to the prior study, without definite findings to suggest pathologic compression fractures. 5. Additional incidental findings, as above.    Impression and Plan:  78 year old with:  1.    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  Diagnosed in April 2020.  The final pathology showed clear-cell histology without any adverse features otherwise.  He is currently on active surveillance without any evidence of relapsed disease.  CT scan obtained in November 2020 showed no evidence of metastatic disease with  some residual postoperative changes.  The natural course of this disease as well as risk of relapse was discussed given his resected adrenal gland was discussed.  For the time being I recommended active surveillance without any additional therapy.  I recommended repeat imaging studies in 3 to 6 months.    2.  Follow-up: In 6 months for repeat follow-up.     I discussed the assessment and treatment plan with the patient. The patient was provided an opportunity to ask questions and all were answered. The patient agreed with the plan and demonstrated an understanding of the instructions.   The patient was advised to call back or seek an in-person evaluation if the symptoms worsen or if the condition fails to improve as anticipated.  I provided 15 minutes of face-to-face video visit time during this encounter, and > 50% was spent on reviewing his disease status update, treatment options and answering questions regarding future plan of care.    Zola Button, MD 01/26/2019 12:15 PM

## 2019-01-29 ENCOUNTER — Telehealth: Payer: Self-pay | Admitting: Oncology

## 2019-01-29 NOTE — Telephone Encounter (Signed)
Scheduled appt per 11/27 los.  Left a VM of the appt date and time.

## 2019-01-31 ENCOUNTER — Encounter: Payer: Self-pay | Admitting: Internal Medicine

## 2019-01-31 ENCOUNTER — Other Ambulatory Visit: Payer: Self-pay

## 2019-01-31 ENCOUNTER — Ambulatory Visit: Payer: Medicare Other | Admitting: Internal Medicine

## 2019-01-31 VITALS — BP 142/80 | HR 66 | Temp 98.2°F | Ht 63.5 in | Wt 177.2 lb

## 2019-01-31 DIAGNOSIS — K219 Gastro-esophageal reflux disease without esophagitis: Secondary | ICD-10-CM

## 2019-01-31 MED ORDER — ESOMEPRAZOLE MAGNESIUM 40 MG PO CPDR: 40.0000 mg | DELAYED_RELEASE_CAPSULE | Freq: Every day | ORAL | 3 refills | Status: DC

## 2019-01-31 NOTE — Patient Instructions (Addendum)
We have sent the following medications to your pharmacy for you to pick up at your convenience:  Nexium  Please follow up in one year
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
## 2019-02-02 DIAGNOSIS — C642 Malignant neoplasm of left kidney, except renal pelvis: Secondary | ICD-10-CM | POA: Diagnosis not present

## 2019-02-06 NOTE — Telephone Encounter (Signed)
Benefits have been received. $0 copay No PA needed  Called patient and informed. He said that he does not want to get the injection at this time. He looked up the side effects and is concerned with that.  He asked if you could call him to discuss this further.

## 2019-02-09 NOTE — Telephone Encounter (Signed)
I have talked with patient and explained process for prolia and risks for side effects---patient is ok with starting prolia and has scheduled nurse appt---per carson, estimated $0 copay, insurance will need to be reverified for next year when injection is due again in 6 months

## 2019-02-15 ENCOUNTER — Ambulatory Visit (INDEPENDENT_AMBULATORY_CARE_PROVIDER_SITE_OTHER): Payer: Medicare Other | Admitting: *Deleted

## 2019-02-15 ENCOUNTER — Other Ambulatory Visit: Payer: Self-pay

## 2019-02-15 DIAGNOSIS — M810 Age-related osteoporosis without current pathological fracture: Secondary | ICD-10-CM | POA: Diagnosis not present

## 2019-02-15 MED ORDER — DENOSUMAB 60 MG/ML ~~LOC~~ SOSY
60.0000 mg | PREFILLED_SYRINGE | Freq: Once | SUBCUTANEOUS | Status: AC
  Administered 2019-02-15: 10:00:00 60 mg via SUBCUTANEOUS

## 2019-02-15 NOTE — Progress Notes (Signed)
Remote pacemaker transmission.   

## 2019-02-15 NOTE — Progress Notes (Signed)
I have reviewed and agree.

## 2019-04-02 ENCOUNTER — Telehealth: Payer: Self-pay | Admitting: Oncology

## 2019-04-02 NOTE — Telephone Encounter (Signed)
Rescheduled per 1/29 sch msg. Called and spoke with pt, confirmed 3/18 appt

## 2019-04-10 ENCOUNTER — Ambulatory Visit: Payer: Medicare Other | Admitting: Cardiovascular Disease

## 2019-04-10 ENCOUNTER — Other Ambulatory Visit: Payer: Self-pay

## 2019-04-10 ENCOUNTER — Encounter: Payer: Self-pay | Admitting: Cardiovascular Disease

## 2019-04-10 VITALS — BP 104/68 | HR 86 | Ht 63.5 in | Wt 179.8 lb

## 2019-04-10 DIAGNOSIS — I480 Paroxysmal atrial fibrillation: Secondary | ICD-10-CM | POA: Diagnosis not present

## 2019-04-10 DIAGNOSIS — I495 Sick sinus syndrome: Secondary | ICD-10-CM

## 2019-04-10 MED ORDER — APIXABAN 5 MG PO TABS: 5.0000 mg | ORAL_TABLET | Freq: Two times a day (BID) | ORAL | 3 refills | Status: DC

## 2019-04-10 NOTE — Progress Notes (Signed)
[name] Date of Birth  05-24-1940       [address]    [name]    [place], Frank [name]  [address]         Problem list: 1. Paroxysmal atrial fibrillation 2. History of syncope while driving 3.  Pacemaker 4, Adrenal cancer 5. R breast mass     [name] is a 79 yo with hx of PAF who was admitted to [facility] after having a syncopal episode while driving.  He had some warning and was able to pull the car over.   He had a 30 day monitor which did not reveal any significant arrhythmias.  He has been walking 30 minutes a day.  He has been eating "Lean Cuisine" dinners.    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  He has not had any symptoms of syncope.  He was started on diltiazem and has been doing well.  He has not had any head aches and is feeling well.    Oct. 14, 2014:  Frank [name] is doing well.  He is staying busy.  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  He get some exercise but needs to get more.  He has had problems with left thigh numbness.  The numbness has  Improved slightly .  He has lost 10 lbs over the past month.    Jul 26, 2013:  Frank [name] is doing well.  He has continued to lose some weight.  He has lost 20 lbs since his last office visit Oct. 2014.    No CP or dyspnea or syncope / presyncope.  Jan. 7, 2016:  Frank [name] is seen today for follow up hx of paroxysmal atrial fib. 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   Had an episode of "not feeling well"  Called EMS,   Monitor possibly showed a brief run of atrial fib.   Resolved while EMS was there . He thinks that it may related to eating too many sweets.  Keeping busy.    Jan. 22, 2018: Frank [name] is seen today for follow-up visit. He has a history of paroxysmal atrial fibrillation. No CP or dyspnea.   Feb.  6,  2019:   Had some palpitations several days ago - happened after he ate some fudge.  Did not know if it was atrial fib or not.   Lasted several hours  Feels well  Was given Lipitor by his primary MD  - has not taken it Trigs were very elevated.   He has been taking fish oil which seems to be helping    April 10, 2018: Frank [name] is seen today for follow-up of his paroxysmal atrial fibrillation.  He also has hyperlipidemia. Has not had any issues over the past year  He was started on Atorvastatin .   But he never took it .  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    Feb. 9, 2021 Frank [name] is seen today for follow-up of his paroxysmal atrial fibrillation.  He has a history of hyperlipidemia. He had a hx of syncope.   Loop recorder revealed significant pauses. He had a pacer placed.  Had a back fracture.   Had kidney  and adrenal gland removed.   Was found to have adrenal cancer  Has not had any recurrence Has had continued back pain .   His back was not healing from his fracture.   Started taking Prolia  Which seems to be healing  He has noticed a Right breast mass.  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    Current Outpatient Medications on File Prior to Visit  Medication Sig Dispense Refill  . apixaban (ELIQUIS) 5 MG TABS tablet Take 1 tablet (5 mg total) by mouth 2 (two) times daily. 60 tablet 10  . Calcium Carbonate (CALCIUM 500 PO) Take 500 mg by mouth 2 (two) times daily.    Marland Kitchen denosumab (PROLIA) 60 MG/ML SOSY injection Inject 60 mg into the skin every 6 (six) months.    . esomeprazole (NEXIUM) 40 MG capsule Take 1 capsule (40 mg total) by mouth daily before breakfast. 90 capsule 3  . Multiple Vitamins-Minerals (CENTRUM MULTI + OMEGA 3 PO) Take by mouth daily.    Marland Kitchen omega-3 acid ethyl esters (LOVAZA) 1 g capsule Take 2 capsules by mouth twice daily 360 capsule 1  . risedronate (ACTONEL) 35 MG tablet Take 1 tablet (35 mg total) by mouth every 7 (seven) days. with water on empty stomach, nothing  by mouth or lie down for next 30 minutes. 12 tablet 1  . rosuvastatin (CRESTOR) 10 MG tablet Take 1 tablet (10 mg total) by mouth daily. 90 tablet 1  . solifenacin (VESICARE) 10 MG tablet Take 1 tablet (10 mg total) by mouth daily. 90 tablet 1  . Tadalafil (CIALIS PO) Take by mouth as needed.    Marland Kitchen VITAMIN D PO Take 500 Units by mouth daily.     No current facility-administered medications on file prior to visit.    Allergies  Allergen Reactions  . Hydrocodone     Caused confusion   . Monosodium Glutamate Other (See Comments)    Caused headaches  . Naproxen Sodium     Mouth blisters with prolonged use.  Left nephrectomy 2020  . Other     Perfume - headaches     Past Medical History:  Diagnosis Date  . Diverticulosis   . Esophageal reflux   . Esophageal stricture   . HA (headache)   . Hearing loss   . Hiatal hernia   . Hypertriglyceridemia   . Hypoglycemia   . Obesity, unspecified   . PAF (paroxysmal atrial fibrillation) (HCC)    Echo with normal LV function and mild LVH  . Presence of permanent cardiac pacemaker   . Renal mass    left  . Syncope    presented with atrial fib converted with Diltiazem  . Systemic inflammatory response syndrome ([place]) 06/26/2018    Past Surgical History:  Procedure Laterality Date  . CYSTOSCOPY WITH URETEROSCOPY AND STENT PLACEMENT Left 08/16/2018   Procedure: CYSTOSCOPY WITH LEFT RETROGRADE PYELOGRAM, BALLOON DILATION LEFT URETER, STENT PLACEMENT;  Surgeon: Lucas Mallow, MD;  Location: [facility];  Service: Urology;  Laterality: Left;  . HERNIA REPAIR    . INSERT / REPLACE / REMOVE PACEMAKER    . LAPAROSCOPIC ASSISTED VENTRAL HERNIA REPAIR N/A 10/18/2018   Procedure: LAPAROSCOPIC ASSISTED VENTRAL WALL HERNIA REPAIR WITH MESH;  Surgeon: Michael Boston, MD;  Location: [facility];  Service: General;  Laterality: N/A;  . LAPAROSCOPIC NEPHRECTOMY, HAND ASSISTED Left 10/18/2018   Procedure: LEFT HAND ASSISTED LAPAROSCOPIC RADICAL NEPHRECTOMY WITH  ADRENALECTOMY;  Surgeon: Lucas Mallow, MD;  Location: [facility];  Service: Urology;  Laterality: Left;  . LOOP RECORDER INSERTION N/A 04/26/2018   Procedure: LOOP RECORDER INSERTION;  Surgeon: Thompson Grayer, MD;  Location: [facility];  Service: Cardiovascular;  Laterality: N/A;  . LOOP RECORDER REMOVAL N/A 07/18/2018   Procedure: LOOP RECORDER REMOVAL;  Surgeon: Thompson Grayer, MD;  Location: [facility];  Service: Cardiovascular;  Laterality: N/A;  . NASAL SEPTUM SURGERY    . PACEMAKER IMPLANT N/A 07/18/2018   Procedure: PACEMAKER IMPLANT;  Surgeon: Thompson Grayer, MD;  Location: [facility];  Service: Cardiovascular;  Laterality: N/A;  . TONSILLECTOMY    . TONSILLECTOMY    . UMBILICAL HERNIA REPAIR  1993  . WISDOM TOOTH EXTRACTION     about 79 years old    Social History   Tobacco Use  Smoking Status Former Smoker  . Quit date: 03/02/1987  . Years since quitting: 32.1  Smokeless Tobacco Never Used    Social History   Substance and Sexual Activity  Alcohol Use No  . Alcohol/week: 0.0 standard drinks    Family History  Problem Relation Age of Onset  . Emphysema Father   . Gallbladder disease Sister   . Bipolar disorder Sister   . Bipolar disorder Paternal Grandmother   . Colon cancer Neg Hx     Reviw of Systems:  Reviewed in current history, otherwise systems are negative.   Physical Exam: Blood pressure 104/68, pulse 86, height 5' 3.5" (1.613 m), weight 179 lb 12.8 oz (81.6 kg), SpO2 97 %.  GEN:  Elderly male, NAD  HEENT: Normal NECK: No JVD; No carotid bruits LYMPHATICS: No lymphadenopathy CARDIAC: RRR,   Small - medium sized mass just below  the left nipple  RESPIRATORY:  Clear to auscultation without rales, wheezing or rhonchi  ABDOMEN: Soft, non-tender, non-distended MUSCULOSKELETAL:  No edema; No deformity  SKIN: Warm and dry NEUROLOGIC:  Alert and oriented x 3   ECG:   Assessment / Plan:   1. Paroxysmal atrial fibrillation-   Feb. 14, 2013 ( documented by ECG)  - CHADS2VASC = 2 ( age 46)  No recurrent  Atrial fib   2. History of syncope while driving -  Now has a pacer   3.  Right breast mass:   Advised him to see general surgery .   He has seen Dr. Johney [place] in the past .    Mertie Moores, MD  04/10/2019 4:02 PM    [address],  [place], [place]  [address] Pager [phone] Phone: [phone]; Fax: [phone]

## 2019-04-10 NOTE — Patient Instructions (Signed)
Medication Instructions:  Your physician recommends that you continue on your current medications as directed. Please refer to the Current Medication list given to you today. A refill of your Eliquis has been sent to your pharmacy *If you need a refill on your cardiac medications before your next appointment, please call your pharmacy*   Lab Work: None Ordered If you have labs (blood work) drawn today and your tests are completely normal, you will receive your results only by: Marland Kitchen MyChart Message (if you have MyChart) OR . A paper copy in the mail If you have any lab test that is abnormal or we need to change your treatment, we will call you to review the results.   Testing/Procedures: None Ordered   Follow-Up: At [facility], you and your health needs are our priority.  As part of our continuing mission to provide you with exceptional heart care, we have created designated Provider Care Teams.  These Care Teams include your primary Cardiologist (physician) and Advanced Practice Providers (APPs -  Physician Assistants and Nurse Practitioners) who all work together to provide you with the care you need, when you need it.  Your next appointment:   1 year(s)  The format for your next appointment:   In Person  Provider:   You may see Mertie Moores, MD or one of the following Advanced Practice Providers on your designated Care Team:    Richardson Dopp, PA-C  [address], [place]  Daune Perch, [place]

## 2019-04-13 DIAGNOSIS — C642 Malignant neoplasm of left kidney, except renal pelvis: Secondary | ICD-10-CM | POA: Diagnosis not present

## 2019-04-13 DIAGNOSIS — K432 Incisional hernia without obstruction or gangrene: Secondary | ICD-10-CM | POA: Diagnosis not present

## 2019-04-13 DIAGNOSIS — N6310 Unspecified lump in the right breast, unspecified quadrant: Secondary | ICD-10-CM | POA: Diagnosis not present

## 2019-04-13 DIAGNOSIS — Z7901 Long term (current) use of anticoagulants: Secondary | ICD-10-CM | POA: Diagnosis not present

## 2019-04-15 ENCOUNTER — Ambulatory Visit: Payer: Medicare Other | Attending: Internal Medicine

## 2019-04-15 DIAGNOSIS — Z23 Encounter for immunization: Secondary | ICD-10-CM | POA: Insufficient documentation

## 2019-04-15 NOTE — Progress Notes (Signed)
   [id number]  Name:  [name].    MRN: [id number] DOB: Jan 25, 1941  04/15/2019  [name] was observed post Covid-19 immunization for 15 minutes without incidence. He was provided with Vaccine Information Sheet and instruction to access the V-Safe system.   [name] was instructed to call 911 with any severe reactions post vaccine: Marland Kitchen Difficulty breathing  . Swelling of your face and throat  . A fast heartbeat  . A bad rash all over your body  . Dizziness and weakness    Immunizations Administered    Name Date Dose VIS Date Route   Pfizer COVID-19 Vaccine 04/15/2019 11:04 AM 0.3 mL 02/09/2019 Intramuscular   Manufacturer: [place]   Lot: [id number]   [place]: [id number]

## 2019-04-16 ENCOUNTER — Other Ambulatory Visit: Payer: Self-pay | Admitting: Surgery

## 2019-04-16 DIAGNOSIS — N6310 Unspecified lump in the right breast, unspecified quadrant: Secondary | ICD-10-CM

## 2019-04-18 ENCOUNTER — Ambulatory Visit (INDEPENDENT_AMBULATORY_CARE_PROVIDER_SITE_OTHER): Payer: Medicare Other | Admitting: *Deleted

## 2019-04-18 DIAGNOSIS — Z950 Presence of cardiac pacemaker: Secondary | ICD-10-CM

## 2019-04-18 LAB — CUP PACEART REMOTE DEVICE CHECK
Battery Remaining Longevity: 167 mo
Battery Voltage: 3.11 V
Brady Statistic AP VP Percent: 0.03 %
Brady Statistic AP VS Percent: 45.83 %
Brady Statistic AS VP Percent: 0.01 %
Brady Statistic AS VS Percent: 54.13 %
Brady Statistic RA Percent Paced: 45.97 %
Brady Statistic RV Percent Paced: 0.04 %
Date Time Interrogation Session: 20210216215353
Implantable Lead Implant Date: 20200519
Implantable Lead Implant Date: 20200519
Implantable Lead Location: 753859
Implantable Lead Location: 753860
Implantable Lead Model: 5076
Implantable Lead Model: 5076
Implantable Pulse Generator Implant Date: 20200519
Lead Channel Impedance Value: 323 Ohm
Lead Channel Impedance Value: 399 Ohm
Lead Channel Impedance Value: 513 Ohm
Lead Channel Impedance Value: 665 Ohm
Lead Channel Pacing Threshold Amplitude: 0.5 V
Lead Channel Pacing Threshold Amplitude: 0.625 V
Lead Channel Pacing Threshold Pulse Width: 0.4 ms
Lead Channel Pacing Threshold Pulse Width: 0.4 ms
Lead Channel Sensing Intrinsic Amplitude: 4.25 mV
Lead Channel Sensing Intrinsic Amplitude: 4.25 mV
Lead Channel Sensing Intrinsic Amplitude: 9.75 mV
Lead Channel Sensing Intrinsic Amplitude: 9.75 mV
Lead Channel Setting Pacing Amplitude: 1.5 V
Lead Channel Setting Pacing Amplitude: 2.5 V
Lead Channel Setting Pacing Pulse Width: 0.4 ms
Lead Channel Setting Sensing Sensitivity: 2 mV

## 2019-04-18 NOTE — Progress Notes (Signed)
PM Remote

## 2019-04-26 ENCOUNTER — Ambulatory Visit
Admission: RE | Admit: 2019-04-26 | Discharge: 2019-04-26 | Disposition: A | Discharge status: Home / Self Care | Payer: Medicare Other | Source: Ambulatory Visit | Attending: Surgery | Admitting: Surgery

## 2019-04-26 ENCOUNTER — Other Ambulatory Visit: Payer: Self-pay | Admitting: Surgery

## 2019-04-26 ENCOUNTER — Other Ambulatory Visit: Payer: Self-pay

## 2019-04-26 ENCOUNTER — Ambulatory Visit: Payer: Medicare Other

## 2019-04-26 DIAGNOSIS — N6310 Unspecified lump in the right breast, unspecified quadrant: Secondary | ICD-10-CM

## 2019-04-26 DIAGNOSIS — R928 Other abnormal and inconclusive findings on diagnostic imaging of breast: Secondary | ICD-10-CM | POA: Diagnosis not present

## 2019-04-26 DIAGNOSIS — N6321 Unspecified lump in the left breast, upper outer quadrant: Secondary | ICD-10-CM | POA: Diagnosis not present

## 2019-04-26 DIAGNOSIS — N6320 Unspecified lump in the left breast, unspecified quadrant: Secondary | ICD-10-CM

## 2019-05-07 DIAGNOSIS — C641 Malignant neoplasm of right kidney, except renal pelvis: Secondary | ICD-10-CM | POA: Diagnosis not present

## 2019-05-07 DIAGNOSIS — C642 Malignant neoplasm of left kidney, except renal pelvis: Secondary | ICD-10-CM | POA: Diagnosis not present

## 2019-05-08 ENCOUNTER — Ambulatory Visit: Payer: Medicare Other | Attending: Internal Medicine

## 2019-05-08 DIAGNOSIS — Z23 Encounter for immunization: Secondary | ICD-10-CM | POA: Insufficient documentation

## 2019-05-08 NOTE — Progress Notes (Signed)
   [id number]  Name:  [name]    MRN: [id number] DOB: May 16, 1940  05/08/2019  [name] was observed post Covid-19 immunization for 15 minutes without incident. He was provided with Vaccine Information Sheet and instruction to access the V-Safe system.   [name] was instructed to call 911 with any severe reactions post vaccine: Marland Kitchen Difficulty breathing  . Swelling of face and throat  . A fast heartbeat  . A bad rash all over body  . Dizziness and weakness   Immunizations Administered    Name Date Dose VIS Date Route   Pfizer COVID-19 Vaccine 05/08/2019  1:10 PM 0.3 mL 02/09/2019 Intramuscular   Manufacturer: [place]   Lot: [id number]   [place]: [id number]

## 2019-05-17 ENCOUNTER — Telehealth: Payer: Self-pay | Admitting: Oncology

## 2019-05-17 ENCOUNTER — Other Ambulatory Visit: Payer: Self-pay

## 2019-05-17 ENCOUNTER — Inpatient Hospital Stay: Payer: Medicare Other | Attending: Oncology | Admitting: Oncology

## 2019-05-17 VITALS — BP 101/88 | HR 60 | Temp 98.3°F | Resp 18 | Ht 63.5 in | Wt 183.2 lb

## 2019-05-17 DIAGNOSIS — Z79899 Other long term (current) drug therapy: Secondary | ICD-10-CM | POA: Diagnosis not present

## 2019-05-17 DIAGNOSIS — K863 Pseudocyst of pancreas: Secondary | ICD-10-CM | POA: Diagnosis not present

## 2019-05-17 DIAGNOSIS — Z905 Acquired absence of kidney: Secondary | ICD-10-CM | POA: Insufficient documentation

## 2019-05-17 DIAGNOSIS — N62 Hypertrophy of breast: Secondary | ICD-10-CM | POA: Insufficient documentation

## 2019-05-17 DIAGNOSIS — C642 Malignant neoplasm of left kidney, except renal pelvis: Secondary | ICD-10-CM | POA: Diagnosis not present

## 2019-05-17 DIAGNOSIS — Z85528 Personal history of other malignant neoplasm of kidney: Secondary | ICD-10-CM | POA: Diagnosis not present

## 2019-05-17 DIAGNOSIS — Z7901 Long term (current) use of anticoagulants: Secondary | ICD-10-CM | POA: Diagnosis not present

## 2019-05-17 NOTE — Telephone Encounter (Signed)
Scheduled appt per 3/18 los.  Sent a message to HIM pool to get a calendar mailed out. 

## 2019-05-17 NOTE — Progress Notes (Signed)
Hematology and Oncology Follow Up   Frank [name] [id number] 04-08-1940 79 y.o. 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    Prior Therapy:  He is status post left radical nephrectomy completed by Dr. Gloriann [name] on October 18, 2018.  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  Current therapy: Active surveillance.  Interim History: [name] is here for a follow-up visit.  Since the last visit, he reports no major changes in his health.  She continues to be reasonably active and attends to activities of daily living.  He did develop a [name] breast mass that was evaluated by mammography and ultrasound which was consistent with gynecomastia.  He denies any flank pain or bone pain.  He denies any pathological fractures.    Medications: Updated without changes. Current Outpatient Medications  Medication Sig Dispense Refill  . apixaban (ELIQUIS) 5 MG TABS tablet Take 1 tablet (5 mg total) by mouth 2 (two) times daily. 180 tablet 3  . Calcium Carbonate (CALCIUM 500 PO) Take 500 mg by mouth 2 (two) times daily.    Marland Kitchen denosumab (PROLIA) 60 MG/ML SOSY injection Inject 60 mg into the skin every 6 (six) months.    . esomeprazole (NEXIUM) 40 MG capsule Take 1 capsule (40 mg total) by mouth daily before breakfast. 90 capsule 3  . Multiple Vitamins-Minerals (CENTRUM MULTI + OMEGA 3 PO) Take by mouth daily.    Marland Kitchen omega-3 acid ethyl esters (LOVAZA) 1 g capsule Take 2 capsules by mouth twice daily 360 capsule 1  . risedronate (ACTONEL) 35 MG tablet Take 1 tablet (35 mg total) by mouth every 7 (seven) days. with water on empty stomach, nothing by mouth or lie down for next 30 minutes. 12 tablet 1  . rosuvastatin (CRESTOR) 10 MG tablet Take 1 tablet (10 mg total) by mouth daily. 90 tablet 1  . solifenacin (VESICARE) 10 MG  tablet Take 1 tablet (10 mg total) by mouth daily. 90 tablet 1  . Tadalafil (CIALIS PO) Take by mouth as needed.    Marland Kitchen VITAMIN D PO Take 500 Units by mouth daily.     No current facility-administered medications for this visit.     Allergies:  Allergies  Allergen Reactions  . Hydrocodone     Caused confusion   . Monosodium Glutamate Other (See Comments)    Caused headaches  . Naproxen Sodium     Mouth blisters with prolonged use.  Left nephrectomy 2020  . Other     Perfume - headaches    Physical exam: Blood pressure 101/88, pulse 60, temperature 98.3 F (36.8 C), temperature source Temporal, resp. rate 18, height 5' 3.5" (1.613 m), weight 183 lb 3.2 oz (83.1 kg), SpO2 99 %.   ECOG 1 General appearance: Comfortable appearing without any discomfort Head: Normocephalic without any trauma Oropharynx: Mucous membranes are moist and pink without any thrush or ulcers. Eyes: Pupils are equal and round reactive to light. Lymph nodes: No cervical, supraclavicular, inguinal or axillary lymphadenopathy.   Heart:regular rate and rhythm.  S1 and S2 without leg edema. Lung: Clear without any rhonchi or wheezes.  No dullness to percussion. Abdomin: Soft, nontender, nondistended with good bowel sounds.  No hepatosplenomegaly. Musculoskeletal: No joint deformity or effusion.  Full range of motion noted. Neurological: No deficits noted on motor, sensory and deep  tendon reflex exam. Skin: No petechial rash or dryness.  Appeared moist.  Psychiatric: Mood and affect appeared appropriate.      Lab Results: Lab Results  Component Value Date   WBC 6.5 01/15/2019   HGB 14.4 01/15/2019   HCT 42.5 01/15/2019   MCV 92.3 01/15/2019   PLT 172.0 01/15/2019     Chemistry      Component Value Date/Time   NA 136 12/06/2018 1430   NA 140 10/12/2017 0729   K 3.8 12/06/2018 1430   K 3.8 11/09/2013 0000   CL 104 12/06/2018 1430   CL 105 11/09/2013 0000   CO2 21 12/06/2018 1430   CO2 26  11/09/2013 0000   BUN 24 (H) 12/06/2018 1430   BUN 13 10/12/2017 0729   CREATININE 1.26 12/06/2018 1430   CREATININE 0.66 11/09/2013 0000      Component Value Date/Time   CALCIUM 9.4 12/06/2018 1430   CALCIUM 8.7 11/09/2013 0000   ALKPHOS 45 06/27/2018 0618   ALKPHOS 55 11/09/2013 0000   AST 19 06/27/2018 0618   AST 18 11/09/2013 0000   ALT 30 06/27/2018 0618   BILITOT 1.0 06/27/2018 0618   BILITOT 0.5 10/12/2017 0729   BILITOT 0.6 11/09/2013 0000         I   Impression and Plan:  79 year old with:  1.    Left renal cell carcinoma diagnosed in August 2020.  He was found to have stage IV disease with adrenal involvement after surgical resection.  He remains on active surveillance at this time without any evidence of relapsed disease.  Imaging studies obtained on March 8 of 2021 were reviewed and showed no evidence of metastatic disease.  The natural course of this disease and risk of relapse was assessed.  I recommended periodic imaging studies which she is currently receiving under the care of Dr. Gloriann [name].  Salvage therapy options for advanced renal cell carcinoma were reviewed in case he develops progression of disease.  These would include systemic therapy utilizing oral targeted drugs, immunotherapy or combination of both.  2.  Pancreatic pseudocyst: Repeat imaging studies and March 2021 showed no expansion likely related to surgery.  3.  Breast mass: Mammography confirmed the presence of gynecomastia rather than malignancy.  4.  Follow-up: We will be in 3 months for repeat follow-up.    30  minutes were dedicated to this visit. The time was spent on reviewing laboratory data, imaging studies, management options for the future and answering questions regarding future plan.   Frank Button, MD 05/17/2019 1:11 PM

## 2019-07-04 ENCOUNTER — Other Ambulatory Visit: Payer: Self-pay | Admitting: Internal Medicine

## 2019-07-11 ENCOUNTER — Telehealth: Payer: Self-pay | Admitting: Internal Medicine

## 2019-07-11 NOTE — Telephone Encounter (Signed)
New message:   Pt is calling and asking does he have to take his Prolia shot exactly at 6 months. He states sometimes in can cause trouble for him at the dentist with his teeth. Please advise.

## 2019-07-11 NOTE — Telephone Encounter (Signed)
Patient requesting refill on Nexium to be sent to [facility] in [place]

## 2019-07-12 MED ORDER — ESOMEPRAZOLE MAGNESIUM 40 MG PO CPDR: 40.0000 mg | DELAYED_RELEASE_CAPSULE | Freq: Every day | ORAL | 2 refills | Status: DC

## 2019-07-12 NOTE — Telephone Encounter (Signed)
Nexium refilled

## 2019-07-13 NOTE — Telephone Encounter (Signed)
Pt contacted and stated that he had a dental appointment right after his prolia injection. The dentist stated that she had seen issues with the prolia injection and dental visits.   Pt last visit with PCP was November. Pt is due for follow up with PCP. Pt informed of same.   Pt has scheduled an appointment with PCP.

## 2019-07-18 ENCOUNTER — Ambulatory Visit (INDEPENDENT_AMBULATORY_CARE_PROVIDER_SITE_OTHER): Payer: Medicare Other | Admitting: Internal Medicine

## 2019-07-18 ENCOUNTER — Encounter: Payer: Self-pay | Admitting: Internal Medicine

## 2019-07-18 ENCOUNTER — Other Ambulatory Visit: Payer: Self-pay

## 2019-07-18 ENCOUNTER — Ambulatory Visit (INDEPENDENT_AMBULATORY_CARE_PROVIDER_SITE_OTHER): Payer: Medicare Other | Admitting: *Deleted

## 2019-07-18 VITALS — BP 144/84 | HR 60 | Temp 97.7°F | Ht 63.5 in | Wt 186.0 lb

## 2019-07-18 DIAGNOSIS — I10 Essential (primary) hypertension: Secondary | ICD-10-CM | POA: Diagnosis not present

## 2019-07-18 DIAGNOSIS — G8929 Other chronic pain: Secondary | ICD-10-CM | POA: Diagnosis not present

## 2019-07-18 DIAGNOSIS — M545 Low back pain, unspecified: Secondary | ICD-10-CM

## 2019-07-18 DIAGNOSIS — E785 Hyperlipidemia, unspecified: Secondary | ICD-10-CM | POA: Diagnosis not present

## 2019-07-18 DIAGNOSIS — I495 Sick sinus syndrome: Secondary | ICD-10-CM

## 2019-07-18 DIAGNOSIS — M4856XG Collapsed vertebra, not elsewhere classified, lumbar region, subsequent encounter for fracture with delayed healing: Secondary | ICD-10-CM | POA: Diagnosis not present

## 2019-07-18 DIAGNOSIS — I480 Paroxysmal atrial fibrillation: Secondary | ICD-10-CM

## 2019-07-18 HISTORY — DX: Other chronic pain: G89.29

## 2019-07-18 HISTORY — DX: Low back pain, unspecified: M54.50

## 2019-07-18 LAB — CUP PACEART REMOTE DEVICE CHECK
Battery Remaining Longevity: 163 mo
Battery Voltage: 3.07 V
Brady Statistic AP VP Percent: 0.03 %
Brady Statistic AP VS Percent: 47.4 %
Brady Statistic AS VP Percent: 0.01 %
Brady Statistic AS VS Percent: 52.56 %
Brady Statistic RA Percent Paced: 47.51 %
Brady Statistic RV Percent Paced: 0.04 %
Date Time Interrogation Session: 20210519030851
Implantable Lead Implant Date: 20200519
Implantable Lead Implant Date: 20200519
Implantable Lead Location: 753859
Implantable Lead Location: 753860
Implantable Lead Model: 5076
Implantable Lead Model: 5076
Implantable Pulse Generator Implant Date: 20200519
Lead Channel Impedance Value: 323 Ohm
Lead Channel Impedance Value: 399 Ohm
Lead Channel Impedance Value: 494 Ohm
Lead Channel Impedance Value: 684 Ohm
Lead Channel Pacing Threshold Amplitude: 0.625 V
Lead Channel Pacing Threshold Amplitude: 0.75 V
Lead Channel Pacing Threshold Pulse Width: 0.4 ms
Lead Channel Pacing Threshold Pulse Width: 0.4 ms
Lead Channel Sensing Intrinsic Amplitude: 4.125 mV
Lead Channel Sensing Intrinsic Amplitude: 4.125 mV
Lead Channel Sensing Intrinsic Amplitude: 8.375 mV
Lead Channel Sensing Intrinsic Amplitude: 8.375 mV
Lead Channel Setting Pacing Amplitude: 1.5 V
Lead Channel Setting Pacing Amplitude: 2.5 V
Lead Channel Setting Pacing Pulse Width: 0.4 ms
Lead Channel Setting Sensing Sensitivity: 2 mV

## 2019-07-18 LAB — BASIC METABOLIC PANEL
BUN: 18 mg/dL (ref 6–23)
CO2: 26 mEq/L (ref 19–32)
Calcium: 9.6 mg/dL (ref 8.4–10.5)
Chloride: 104 mEq/L (ref 96–112)
Creatinine, Ser: 1.17 mg/dL (ref 0.40–1.50)
GFR: 60.38 mL/min (ref >60.00)
Glucose, Bld: 67 mg/dL — ABNORMAL LOW (ref 70–99)
Potassium: 4.1 mEq/L (ref 3.5–5.1)
Sodium: 137 mEq/L (ref 135–145)

## 2019-07-18 MED ORDER — ROSUVASTATIN CALCIUM 10 MG PO TABS: 10.0000 mg | ORAL_TABLET | Freq: Every day | ORAL | 1 refills | Status: DC

## 2019-07-18 MED ORDER — NABUMETONE 500 MG PO TABS: 500.0000 mg | ORAL_TABLET | Freq: Two times a day (BID) | ORAL | 1 refills | Status: DC | PRN

## 2019-07-18 NOTE — Progress Notes (Signed)
Subjective:  Patient ID: [name], male    DOB: Jun 18, 1940  Age: 79 y.o. MRN: [id number]  CC: Hypertension and Back Pain  This visit occurred during the SARS-CoV-2 public health emergency.  Safety protocols were in place, including screening questions prior to the visit, additional usage of staff PPE, and extensive cleaning of exam room while observing appropriate contact time as indicated for disinfecting solutions.    HPI Frank Kersh. presents for f/up - He is status post thoracic and lumbar vertebral fractures about a year or 2 ago.  He continues to complain of nonradiating low back pain.  He is not getting much symptom relief with Tylenol.  He denies lower extremity paresthesias.  Outpatient Medications Prior to Visit  Medication Sig Dispense Refill  . apixaban (ELIQUIS) 5 MG TABS tablet Take 1 tablet (5 mg total) by mouth 2 (two) times daily. 180 tablet 3  . Calcium Carbonate (CALCIUM 500 PO) Take 500 mg by mouth 2 (two) times daily.    Marland Kitchen denosumab (PROLIA) 60 MG/ML SOSY injection Inject 60 mg into the skin every 6 (six) months.    . esomeprazole (NEXIUM) 40 MG capsule Take 1 capsule (40 mg total) by mouth daily before breakfast. 90 capsule 2  . Multiple Vitamins-Minerals (CENTRUM MULTI + OMEGA 3 PO) Take by mouth daily.    Marland Kitchen omega-3 acid ethyl esters (LOVAZA) 1 g capsule Take 2 capsules by mouth twice daily 360 capsule 1  . solifenacin (VESICARE) 10 MG tablet TAKE ONE TABLET BY MOUTH DAILY 90 tablet 1  . VITAMIN D PO Take 500 Units by mouth daily.    . rosuvastatin (CRESTOR) 10 MG tablet Take 1 tablet (10 mg total) by mouth daily. 90 tablet 1  . risedronate (ACTONEL) 35 MG tablet Take 1 tablet (35 mg total) by mouth every 7 (seven) days. with water on empty stomach, nothing by mouth or lie down for next 30 minutes. 12 tablet 1  . Tadalafil (CIALIS PO) Take by mouth as needed.     No facility-administered medications prior to visit.    ROS Review of Systems    Constitutional: Negative.  Negative for diaphoresis and fatigue.  HENT: Negative.   Eyes: Negative.   Respiratory: Negative for cough, chest tightness, shortness of breath and wheezing.   Cardiovascular: Negative for chest pain, palpitations and leg swelling.  Gastrointestinal: Negative for abdominal pain, constipation, diarrhea, nausea and vomiting.  Endocrine: Negative.   Genitourinary: Negative.  Negative for difficulty urinating and dysuria.  Musculoskeletal: Positive for back pain. Negative for myalgias.  Skin: Negative.   Neurological: Negative.  Negative for dizziness, weakness, light-headedness and headaches.  Hematological: Negative for adenopathy. Does not bruise/bleed easily.  Psychiatric/Behavioral: Negative.     Objective:  BP (!) 144/84 (BP Location: Left Arm, Patient Position: Sitting, Cuff Size: Normal)   Pulse 60   Temp 97.7 F (36.5 C) (Oral)   Ht 5' 3.5" (1.613 m)   Wt 186 lb (84.4 kg)   SpO2 98%   BMI 32.43 kg/m   BP Readings from Last 3 Encounters:  07/18/19 (!) 144/84  05/17/19 101/88  04/10/19 104/68    Wt Readings from Last 3 Encounters:  07/18/19 186 lb (84.4 kg)  05/17/19 183 lb 3.2 oz (83.1 kg)  04/10/19 179 lb 12.8 oz (81.6 kg)    Physical Exam Vitals reviewed.  Constitutional:      Appearance: Normal appearance.  HENT:     Nose: Nose normal.  Mouth/Throat:     Mouth: Mucous membranes are moist.  Eyes:     General: No scleral icterus.    Conjunctiva/sclera: Conjunctivae normal.  Cardiovascular:     Rate and Rhythm: Normal rate and regular rhythm.     Heart sounds: No murmur.  Pulmonary:     Effort: Pulmonary effort is normal.     Breath sounds: No stridor. No wheezing, rhonchi or rales.  Abdominal:     General: Abdomen is flat.     Palpations: There is no mass.     Tenderness: There is no abdominal tenderness. There is no guarding.  Musculoskeletal:        General: Normal range of motion.     Cervical back: Neck supple.      Right lower leg: No edema.     Left lower leg: No edema.  Lymphadenopathy:     Cervical: No cervical adenopathy.  Skin:    General: Skin is warm and dry.  Neurological:     General: No focal deficit present.     Mental Status: He is alert.  Psychiatric:        Mood and Affect: Mood normal.        Behavior: Behavior normal.     Lab Results  Component Value Date   WBC 6.5 01/15/2019   HGB 14.4 01/15/2019   HCT 42.5 01/15/2019   PLT 172.0 01/15/2019   GLUCOSE 67 (L) 07/18/2019   CHOL 183 01/15/2019   TRIG 196.0 (H) 01/15/2019   HDL 30.80 (L) 01/15/2019   LDLDIRECT 91.0 04/23/2015   LDLCALC 113 (H) 01/15/2019   ALT 30 06/27/2018   AST 19 06/27/2018   NA 137 07/18/2019   K 4.1 07/18/2019   CL 104 07/18/2019   CREATININE 1.17 07/18/2019   BUN 18 07/18/2019   CO2 26 07/18/2019   TSH 2.01 01/11/2018   PSA 0.20 01/15/2019   INR 1.2 10/11/2018   HGBA1C 6.2 12/06/2018    US BREAST LTD UNI LEFT INC AXILLA  Result Date: 04/26/2019 CLINICAL DATA:  79 year old male presenting with a new palpable area and tenderness of concern in the retroareolar right breast. EXAM: DIGITAL DIAGNOSTIC BILATERAL MAMMOGRAM WITH CAD AND TOMO ULTRASOUND LEFT BREAST COMPARISON:  Previous exam(s). ACR Breast Density Category b: There are scattered areas of fibroglandular density. FINDINGS: Mammogram: Right breast: A skin BB marks the palpable site of concern in the retroareolar right breast. There is flame shaped fibroglandular tissue in the retroareolar breast which is asymmetrically increased compared to the left side. No suspicious solid mass. No microcalcification. Left breast: There is a small amount of breast tissue in the retroareolar left breast. In the upper outer there is a round mass measuring 0.5 cm which persists on spot compression views. Mammographic images were processed with CAD. On physical exam, I palpate firmness in the retroareolar right breast without a fixed discrete mass. There are no  skin changes. Ultrasound: Targeted ultrasound is performed in the left breast at 2:30 o'clock 4 cm from the nipple demonstrating an oval circumscribed hypoechoic mass with fatty hilum measuring 0.3 x 0.2 x 0.3 cm, consistent with an intramammary lymph node. IMPRESSION: 1. Asymmetric right greater than left gynecomastia which explain the patient's palpable area of concern in the retroareolar right breast. No mammographic evidence of malignancy. 2. Left breast mass at 2:30 o'clock measuring 0 point 3 cm consistent with a benign intramammary lymph node. RECOMMENDATION: Clinical follow-up for the right breast gynecomastia. Patient feels this may be related to  a recent Prolia shot. I discussed with the patient the fact that gynecomastia can occur in men as testosterone levels decrease with age or in younger men with low testosterone levels, causing a change in the serum testosterone:estrogen ratio. We also discussed other potential etiologies of gynecomastia including numerous prescription medications and recreational drugs (marijuana and anabolic steroids in particular). Approximately 20% of cases of gynecomastia are idiopathic. I counseled the patient to perform self-examination to make sure that a hard lump does not develop which could indicate malignancy and would need further evaluation. BI-RADS CATEGORY  2: Benign. Electronically Signed   By: Audie Pinto M.D.   On: 04/26/2019 15:03   MM DIAG BREAST TOMO BILATERAL  Result Date: 04/26/2019 CLINICAL DATA:  79 year old male presenting with a new palpable area and tenderness of concern in the retroareolar right breast. EXAM: DIGITAL DIAGNOSTIC BILATERAL MAMMOGRAM WITH CAD AND TOMO ULTRASOUND LEFT BREAST COMPARISON:  Previous exam(s). ACR Breast Density Category b: There are scattered areas of fibroglandular density. FINDINGS: Mammogram: Right breast: A skin BB marks the palpable site of concern in the retroareolar right breast. There is flame shaped  fibroglandular tissue in the retroareolar breast which is asymmetrically increased compared to the left side. No suspicious solid mass. No microcalcification. Left breast: There is a small amount of breast tissue in the retroareolar left breast. In the upper outer there is a round mass measuring 0.5 cm which persists on spot compression views. Mammographic images were processed with CAD. On physical exam, I palpate firmness in the retroareolar right breast without a fixed discrete mass. There are no skin changes. Ultrasound: Targeted ultrasound is performed in the left breast at 2:30 o'clock 4 cm from the nipple demonstrating an oval circumscribed hypoechoic mass with fatty hilum measuring 0.3 x 0.2 x 0.3 cm, consistent with an intramammary lymph node. IMPRESSION: 1. Asymmetric right greater than left gynecomastia which explain the patient's palpable area of concern in the retroareolar right breast. No mammographic evidence of malignancy. 2. Left breast mass at 2:30 o'clock measuring 0 point 3 cm consistent with a benign intramammary lymph node. RECOMMENDATION: Clinical follow-up for the right breast gynecomastia. Patient feels this may be related to a recent Prolia shot. I discussed with the patient the fact that gynecomastia can occur in men as testosterone levels decrease with age or in younger men with low testosterone levels, causing a change in the serum testosterone:estrogen ratio. We also discussed other potential etiologies of gynecomastia including numerous prescription medications and recreational drugs (marijuana and anabolic steroids in particular). Approximately 20% of cases of gynecomastia are idiopathic. I counseled the patient to perform self-examination to make sure that a hard lump does not develop which could indicate malignancy and would need further evaluation. BI-RADS CATEGORY  2: Benign. Electronically Signed   By: Audie Pinto M.D.   On: 04/26/2019 15:03    Assessment & Plan:    Frank [name] was seen today for hypertension and back pain.  Diagnoses and all orders for this visit:  Essential hypertension- His blood pressure is adequately well controlled. -     Basic metabolic panel; Future -     Basic metabolic panel  Hyperlipidemia with target LDL less than 70- He is doing well on the statin. -     rosuvastatin (CRESTOR) 10 MG tablet; Take 1 tablet (10 mg total) by mouth daily.  Non-traumatic compression fracture of L1 lumbar vertebra with delayed healing, subsequent encounter- See below -     nabumetone (RELAFEN) 500 MG tablet;  Take 1 tablet (500 mg total) by mouth 2 (two) times daily as needed.  Chronic bilateral low back pain without sciatica- I think he would benefit from taking nabumetone. -     nabumetone (RELAFEN) 500 MG tablet; Take 1 tablet (500 mg total) by mouth 2 (two) times daily as needed.   I have discontinued [name] "Frank [name]"'s risedronate and Tadalafil (CIALIS PO). I am also having him start on nabumetone. Additionally, I am having him maintain his omega-3 acid ethyl esters, Calcium Carbonate (CALCIUM 500 PO), VITAMIN D PO, Multiple Vitamins-Minerals (CENTRUM MULTI + OMEGA 3 PO), denosumab, apixaban, solifenacin, esomeprazole, and rosuvastatin.  Meds ordered this encounter  Medications  . rosuvastatin (CRESTOR) 10 MG tablet    Sig: Take 1 tablet (10 mg total) by mouth daily.    Dispense:  90 tablet    Refill:  1  . nabumetone (RELAFEN) 500 MG tablet    Sig: Take 1 tablet (500 mg total) by mouth 2 (two) times daily as needed.    Dispense:  180 tablet    Refill:  1     Follow-up: Return in about 4 months (around 11/18/2019).  Scarlette Calico, MD

## 2019-07-18 NOTE — Patient Instructions (Signed)

## 2019-07-19 ENCOUNTER — Ambulatory Visit: Payer: Medicare Other | Admitting: Oncology

## 2019-07-20 NOTE — Progress Notes (Signed)
Remote pacemaker transmission.   

## 2019-07-23 ENCOUNTER — Other Ambulatory Visit: Payer: Self-pay | Admitting: Internal Medicine

## 2019-07-23 DIAGNOSIS — E781 Pure hyperglyceridemia: Secondary | ICD-10-CM

## 2019-08-03 ENCOUNTER — Telehealth: Payer: Self-pay | Admitting: *Deleted

## 2019-08-03 NOTE — Telephone Encounter (Signed)
PPM alert received for AF episode ongoing for >6hrs as of transmission received 08/02/19 at 23:59. V rates overall controlled.  Spoke with pt. He reports some nausea last night, as well as feeling like he might pass out. Put a wet cloth on his head and rested. Also took some Tums as his stomach was "burning." Denies chest pain, SOB, palpitations, or syncope. States he may have missed one dose of Eliquis yesterday as he has not been utilizing his pill case. Denies any symptoms today, feels he is at baseline.  Assisted with manual transmission. Confirmed pt back in SR, presenting rhythm AS/VS 70s. Total AF episode duration was 16hr 81min. Pt reports he feels better knowing he is back in SR. AF education provided. Explained importance of Eliquis compliance. ED precautions reviewed. Pt verbalizes understanding of all instructions.  Routed to Dr. Rayann Heman for review and recommendations.

## 2019-08-13 DIAGNOSIS — K449 Diaphragmatic hernia without obstruction or gangrene: Secondary | ICD-10-CM | POA: Diagnosis not present

## 2019-08-13 DIAGNOSIS — C642 Malignant neoplasm of left kidney, except renal pelvis: Secondary | ICD-10-CM | POA: Diagnosis not present

## 2019-08-13 DIAGNOSIS — C641 Malignant neoplasm of right kidney, except renal pelvis: Secondary | ICD-10-CM | POA: Diagnosis not present

## 2019-08-17 ENCOUNTER — Inpatient Hospital Stay: Payer: Medicare Other | Attending: Oncology | Admitting: Oncology

## 2019-08-17 ENCOUNTER — Other Ambulatory Visit: Payer: Self-pay

## 2019-08-17 VITALS — BP 131/96 | HR 59 | Temp 97.5°F | Resp 18 | Ht 63.5 in | Wt 186.2 lb

## 2019-08-17 DIAGNOSIS — I2510 Atherosclerotic heart disease of native coronary artery without angina pectoris: Secondary | ICD-10-CM | POA: Diagnosis not present

## 2019-08-17 DIAGNOSIS — Z85528 Personal history of other malignant neoplasm of kidney: Secondary | ICD-10-CM | POA: Insufficient documentation

## 2019-08-17 DIAGNOSIS — Z7901 Long term (current) use of anticoagulants: Secondary | ICD-10-CM | POA: Diagnosis not present

## 2019-08-17 DIAGNOSIS — J439 Emphysema, unspecified: Secondary | ICD-10-CM | POA: Diagnosis not present

## 2019-08-17 DIAGNOSIS — Z791 Long term (current) use of non-steroidal anti-inflammatories (NSAID): Secondary | ICD-10-CM | POA: Diagnosis not present

## 2019-08-17 DIAGNOSIS — C642 Malignant neoplasm of left kidney, except renal pelvis: Secondary | ICD-10-CM | POA: Diagnosis not present

## 2019-08-17 DIAGNOSIS — K863 Pseudocyst of pancreas: Secondary | ICD-10-CM | POA: Insufficient documentation

## 2019-08-17 DIAGNOSIS — I714 Abdominal aortic aneurysm, without rupture: Secondary | ICD-10-CM | POA: Insufficient documentation

## 2019-08-17 DIAGNOSIS — Z79899 Other long term (current) drug therapy: Secondary | ICD-10-CM | POA: Insufficient documentation

## 2019-08-17 DIAGNOSIS — N62 Hypertrophy of breast: Secondary | ICD-10-CM | POA: Diagnosis not present

## 2019-08-17 NOTE — Progress Notes (Signed)
Hematology and Oncology Follow Up   Frank [name] [id number] [birth year] 79 y.o. 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  He has no evidence of active disease at this time.   Prior Therapy:  He is status post left radical nephrectomy completed by Dr. Gloriann Loan on October 18, 2018.  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  Current therapy: Active surveillance.  Interim History: Frank [name] is here for return evaluation.  Since last visit, he reports no major changes in his health.  He does report bilateral gynecomastia which has not increased or changed dramatically with very little discomfort.  He does report chronic back pain related to osteoporosis which is improved with Prolia.  He denies any abdominal pain or discomfort.  He denies any recent hospitalization or illnesses.    Medications: reviewed without changes. Current Outpatient Medications  Medication Sig Dispense Refill  . apixaban (ELIQUIS) 5 MG TABS tablet Take 1 tablet (5 mg total) by mouth 2 (two) times daily. 180 tablet 3  . Calcium Carbonate (CALCIUM 500 PO) Take 500 mg by mouth 2 (two) times daily.    Marland Kitchen denosumab (PROLIA) 60 MG/ML SOSY injection Inject 60 mg into the skin every 6 (six) months.    . esomeprazole (NEXIUM) 40 MG capsule Take 1 capsule (40 mg total) by mouth daily before breakfast. 90 capsule 2  . Multiple Vitamins-Minerals (CENTRUM MULTI + OMEGA 3 PO) Take by mouth daily.    . nabumetone (RELAFEN) 500 MG tablet Take 1 tablet (500 mg total) by mouth 2 (two) times daily as needed. 180 tablet 1  . omega-3 acid ethyl esters (LOVAZA) 1 g capsule Take 2 capsules by mouth twice daily 360 capsule 1  . rosuvastatin (CRESTOR) 10 MG tablet Take 1 tablet (10 mg total) by mouth daily. 90 tablet 1  .  solifenacin (VESICARE) 10 MG tablet TAKE ONE TABLET BY MOUTH DAILY 90 tablet 1  . VITAMIN D PO Take 500 Units by mouth daily.     No current facility-administered medications for this visit.     Allergies:  Allergies  Allergen Reactions  . Hydrocodone     Caused confusion   . Monosodium Glutamate Other (See Comments)    Caused headaches  . Naproxen Sodium     Mouth blisters with prolonged use.  Left nephrectomy 2020  . Other     Perfume - headaches    Physical exam: Blood pressure (!) 131/96, pulse (!) 59, temperature (!) 97.5 F (36.4 C), temperature source Temporal, resp. rate 18, height 5' 3.5" (1.613 m), weight 186 lb 3.2 oz (84.5 kg), SpO2 99 %.    ECOG 1    General appearance: Alert, awake without any distress. Head: Atraumatic without abnormalities Oropharynx: Without any thrush or ulcers. Eyes: No scleral icterus. Lymph nodes: No lymphadenopathy noted in the cervical, supraclavicular, or axillary nodes Heart:regular rate and rhythm, without any murmurs or gallops.   Lung: Clear to auscultation without any rhonchi, wheezes or dullness to percussion. Abdomin: Soft, nontender without any shifting dullness or ascites. Musculoskeletal: No clubbing or cyanosis. Neurological: No motor or sensory deficits. Skin: No rashes or lesions.       Lab Results: Lab Results  Component Value Date   WBC 6.5 01/15/2019   HGB 14.4 01/15/2019  HCT 42.5 01/15/2019   MCV 92.3 01/15/2019   PLT 172.0 01/15/2019     Chemistry      Component Value Date/Time   NA 137 07/18/2019 0933   NA 140 10/12/2017 0729   K 4.1 07/18/2019 0933   K 3.8 11/09/2013 0000   CL 104 07/18/2019 0933   CL 105 11/09/2013 0000   CO2 26 07/18/2019 0933   CO2 26 11/09/2013 0000   BUN 18 07/18/2019 0933   BUN 13 10/12/2017 0729   CREATININE 1.17 07/18/2019 0933   CREATININE 0.66 11/09/2013 0000      Component Value Date/Time   CALCIUM 9.6 07/18/2019 0933   CALCIUM 8.7 11/09/2013 0000    ALKPHOS 45 06/27/2018 0618   ALKPHOS 55 11/09/2013 0000   AST 19 06/27/2018 0618   AST 18 11/09/2013 0000   ALT 30 06/27/2018 0618   BILITOT 1.0 06/27/2018 0618   BILITOT 0.5 10/12/2017 0729   BILITOT 0.6 11/09/2013 0000       IMPRESSION: 1. No findings of recurrence. 2. The cystic lesion along the resected pancreatic tail adjacent to the suture line is moderately reduced in size compared to the prior exam, favoring postoperative seroma or a small pseudocyst. 3. 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 If no intervening follow up cancer imaging for surveillance, then I recommend followup by [place] in 3 years. This recommendation follows ACR consensus guidelines: White Paper of the ACR Incidental Findings Committee II on Vascular Findings. J Am Coll Radiol 2013; 38:453-646 4. Other imaging findings of potential clinical significance: Coronary atherosclerosis with mild cardiomegaly. Small type 1 hiatal hernia. Sigmoid colon diverticulosis. Adjacent prominent compression fractures at T12 and L1 with some sclerosis in the vertebral bodies suggesting late subacute chronicity, but not substantially changed from 05/07/2019. 5. Emphysema and aortic atherosclerosis.  I   Impression and Plan:  79 year old with:  1.    Clear-cell renal cell carcinoma after presenting with left renal mass and adrenal gland involvement.  He has no evidence of active disease after surgical resection.  He is currently on active surveillance after surgical resection without any evidence of metastatic disease.  Imaging studies obtained in June 2021 under the care of Dr. Gloriann Loan for personally reviewed and showed no evidence of metastatic disease.  Risks and benefits of active surveillance versus adjuvant immunotherapy option were reviewed at this time.  This point the recent evidence of benefit of adjuvant immunotherapy standard of care remains active surveillance at least for the time  being.  We will continue to monitor his case closely and consider systemic therapy in the future.  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  3.  Breast mass: Related to gynecomastia without any evidence of malignancy.  4.  Follow-up: Will be in 6 months for repeat follow-up.    30  minutes were spent on this encounter.  The time was dedicated to reviewing his disease status, reviewing imaging studies and outlining future plan of care.   Zola Button, MD 08/17/2019 10:26 AM

## 2019-08-24 ENCOUNTER — Telehealth: Payer: Self-pay | Admitting: Oncology

## 2019-08-24 NOTE — Telephone Encounter (Signed)
Scheduled per los, patient has been called and voicemail was left. 

## 2019-09-05 DIAGNOSIS — R3915 Urgency of urination: Secondary | ICD-10-CM | POA: Diagnosis not present

## 2019-09-05 DIAGNOSIS — C642 Malignant neoplasm of left kidney, except renal pelvis: Secondary | ICD-10-CM | POA: Diagnosis not present

## 2019-09-05 DIAGNOSIS — C7972 Secondary malignant neoplasm of left adrenal gland: Secondary | ICD-10-CM | POA: Diagnosis not present

## 2019-09-13 ENCOUNTER — Telehealth: Payer: Self-pay | Admitting: Internal Medicine

## 2019-09-13 NOTE — Telephone Encounter (Signed)
Insurance has been submitted and verified for Prolia. Patient is responsible for a $240 copay. Due anytime.  He would like to wait until after his dentist appointment on July 27th. He will call back to schedule.   Okay to schedule... Visit Note: Prolia ($240 copay - okay to give per Gareth Eagle) Visit Type: Nurse Provider: Nurse

## 2019-10-03 ENCOUNTER — Other Ambulatory Visit: Payer: Self-pay

## 2019-10-03 ENCOUNTER — Ambulatory Visit (INDEPENDENT_AMBULATORY_CARE_PROVIDER_SITE_OTHER): Payer: Medicare Other | Admitting: *Deleted

## 2019-10-03 DIAGNOSIS — M810 Age-related osteoporosis without current pathological fracture: Secondary | ICD-10-CM

## 2019-10-03 MED ORDER — DENOSUMAB 60 MG/ML ~~LOC~~ SOSY
60.0000 mg | PREFILLED_SYRINGE | Freq: Once | SUBCUTANEOUS | Status: AC
  Administered 2019-10-03: 60 mg via SUBCUTANEOUS

## 2019-10-03 NOTE — Progress Notes (Signed)
I have reviewed and agree.

## 2019-10-05 ENCOUNTER — Telehealth: Payer: Self-pay

## 2019-10-05 NOTE — Telephone Encounter (Signed)
1.Medication Requested:solifenacin (VESICARE) 10 MG tablet  2. Pharmacy (Name, Street, [place]yr):[place], [place] - [address]  3. On Med List: Yes   4. Last Visit with PCP: 5.19.21   5. Next visit date with PCP: n/a    Agent: Please be advised that RX refills may take up to 3 business days. We ask that you follow-up with your pharmacy.

## 2019-10-08 MED ORDER — SOLIFENACIN SUCCINATE 10 MG PO TABS: 10.0000 mg | ORAL_TABLET | Freq: Every day | ORAL | 1 refills | Status: DC

## 2019-10-08 NOTE — Telephone Encounter (Signed)
Erx has been sent as requested  

## 2019-10-17 ENCOUNTER — Ambulatory Visit (INDEPENDENT_AMBULATORY_CARE_PROVIDER_SITE_OTHER): Payer: Medicare Other | Admitting: *Deleted

## 2019-10-17 DIAGNOSIS — I495 Sick sinus syndrome: Secondary | ICD-10-CM | POA: Diagnosis not present

## 2019-10-17 LAB — CUP PACEART REMOTE DEVICE CHECK
Battery Remaining Longevity: 160 mo
Battery Voltage: 3.05 V
Brady Statistic AP VP Percent: 0.03 %
Brady Statistic AP VS Percent: 48.64 %
Brady Statistic AS VP Percent: 0 %
Brady Statistic AS VS Percent: 51.32 %
Brady Statistic RA Percent Paced: 48.76 %
Brady Statistic RV Percent Paced: 0.04 %
Date Time Interrogation Session: 20210817225054
Implantable Lead Implant Date: 20200519
Implantable Lead Implant Date: 20200519
Implantable Lead Location: 753859
Implantable Lead Location: 753860
Implantable Lead Model: 5076
Implantable Lead Model: 5076
Implantable Pulse Generator Implant Date: 20200519
Lead Channel Impedance Value: 323 Ohm
Lead Channel Impedance Value: 418 Ohm
Lead Channel Impedance Value: 475 Ohm
Lead Channel Impedance Value: 646 Ohm
Lead Channel Pacing Threshold Amplitude: 0.625 V
Lead Channel Pacing Threshold Amplitude: 0.75 V
Lead Channel Pacing Threshold Pulse Width: 0.4 ms
Lead Channel Pacing Threshold Pulse Width: 0.4 ms
Lead Channel Sensing Intrinsic Amplitude: 4.375 mV
Lead Channel Sensing Intrinsic Amplitude: 4.375 mV
Lead Channel Sensing Intrinsic Amplitude: 8.25 mV
Lead Channel Sensing Intrinsic Amplitude: 8.25 mV
Lead Channel Setting Pacing Amplitude: 1.5 V
Lead Channel Setting Pacing Amplitude: 2.5 V
Lead Channel Setting Pacing Pulse Width: 0.4 ms
Lead Channel Setting Sensing Sensitivity: 2 mV

## 2019-10-19 NOTE — Progress Notes (Signed)
Remote pacemaker transmission.   

## 2019-12-03 DIAGNOSIS — H5203 Hypermetropia, bilateral: Secondary | ICD-10-CM | POA: Diagnosis not present

## 2019-12-03 DIAGNOSIS — H04123 Dry eye syndrome of bilateral lacrimal glands: Secondary | ICD-10-CM | POA: Diagnosis not present

## 2019-12-03 DIAGNOSIS — H2513 Age-related nuclear cataract, bilateral: Secondary | ICD-10-CM | POA: Diagnosis not present

## 2020-01-01 DIAGNOSIS — C642 Malignant neoplasm of left kidney, except renal pelvis: Secondary | ICD-10-CM | POA: Diagnosis not present

## 2020-01-03 DIAGNOSIS — J432 Centrilobular emphysema: Secondary | ICD-10-CM | POA: Diagnosis not present

## 2020-01-03 DIAGNOSIS — M4854XA Collapsed vertebra, not elsewhere classified, thoracic region, initial encounter for fracture: Secondary | ICD-10-CM | POA: Diagnosis not present

## 2020-01-03 DIAGNOSIS — K862 Cyst of pancreas: Secondary | ICD-10-CM | POA: Diagnosis not present

## 2020-01-03 DIAGNOSIS — M4855XA Collapsed vertebra, not elsewhere classified, thoracolumbar region, initial encounter for fracture: Secondary | ICD-10-CM | POA: Diagnosis not present

## 2020-01-03 DIAGNOSIS — C642 Malignant neoplasm of left kidney, except renal pelvis: Secondary | ICD-10-CM | POA: Diagnosis not present

## 2020-01-03 DIAGNOSIS — K449 Diaphragmatic hernia without obstruction or gangrene: Secondary | ICD-10-CM | POA: Diagnosis not present

## 2020-01-08 DIAGNOSIS — D3500 Benign neoplasm of unspecified adrenal gland: Secondary | ICD-10-CM | POA: Diagnosis not present

## 2020-01-08 DIAGNOSIS — C642 Malignant neoplasm of left kidney, except renal pelvis: Secondary | ICD-10-CM | POA: Diagnosis not present

## 2020-01-08 DIAGNOSIS — R3915 Urgency of urination: Secondary | ICD-10-CM | POA: Diagnosis not present

## 2020-01-14 ENCOUNTER — Other Ambulatory Visit: Payer: Self-pay | Admitting: Internal Medicine

## 2020-01-14 DIAGNOSIS — E785 Hyperlipidemia, unspecified: Secondary | ICD-10-CM

## 2020-01-16 ENCOUNTER — Ambulatory Visit (INDEPENDENT_AMBULATORY_CARE_PROVIDER_SITE_OTHER): Payer: Medicare Other

## 2020-01-16 DIAGNOSIS — I495 Sick sinus syndrome: Secondary | ICD-10-CM | POA: Diagnosis not present

## 2020-01-16 LAB — CUP PACEART REMOTE DEVICE CHECK
Battery Remaining Longevity: 156 mo
Battery Voltage: 3.04 V
Brady Statistic AP VP Percent: 0.03 %
Brady Statistic AP VS Percent: 52.7 %
Brady Statistic AS VP Percent: 0 %
Brady Statistic AS VS Percent: 47.26 %
Brady Statistic RA Percent Paced: 52.82 %
Brady Statistic RV Percent Paced: 0.03 %
Date Time Interrogation Session: 20211117045124
Implantable Lead Implant Date: 20200519
Implantable Lead Implant Date: 20200519
Implantable Lead Location: 753859
Implantable Lead Location: 753860
Implantable Lead Model: 5076
Implantable Lead Model: 5076
Implantable Pulse Generator Implant Date: 20200519
Lead Channel Impedance Value: 323 Ohm
Lead Channel Impedance Value: 380 Ohm
Lead Channel Impedance Value: 475 Ohm
Lead Channel Impedance Value: 665 Ohm
Lead Channel Pacing Threshold Amplitude: 0.5 V
Lead Channel Pacing Threshold Amplitude: 0.75 V
Lead Channel Pacing Threshold Pulse Width: 0.4 ms
Lead Channel Pacing Threshold Pulse Width: 0.4 ms
Lead Channel Sensing Intrinsic Amplitude: 5.125 mV
Lead Channel Sensing Intrinsic Amplitude: 5.125 mV
Lead Channel Sensing Intrinsic Amplitude: 9.125 mV
Lead Channel Sensing Intrinsic Amplitude: 9.125 mV
Lead Channel Setting Pacing Amplitude: 1.5 V
Lead Channel Setting Pacing Amplitude: 2.5 V
Lead Channel Setting Pacing Pulse Width: 0.4 ms
Lead Channel Setting Sensing Sensitivity: 2 mV

## 2020-01-17 ENCOUNTER — Ambulatory Visit (INDEPENDENT_AMBULATORY_CARE_PROVIDER_SITE_OTHER): Payer: Medicare Other

## 2020-01-17 ENCOUNTER — Ambulatory Visit (INDEPENDENT_AMBULATORY_CARE_PROVIDER_SITE_OTHER): Payer: Medicare Other | Admitting: Internal Medicine

## 2020-01-17 ENCOUNTER — Other Ambulatory Visit: Payer: Self-pay

## 2020-01-17 ENCOUNTER — Encounter: Payer: Self-pay | Admitting: Internal Medicine

## 2020-01-17 VITALS — BP 118/76 | HR 67 | Temp 98.2°F | Ht 63.5 in | Wt 183.0 lb

## 2020-01-17 DIAGNOSIS — M5136 Other intervertebral disc degeneration, lumbar region: Secondary | ICD-10-CM | POA: Diagnosis not present

## 2020-01-17 DIAGNOSIS — M4856XG Collapsed vertebra, not elsewhere classified, lumbar region, subsequent encounter for fracture with delayed healing: Secondary | ICD-10-CM | POA: Diagnosis not present

## 2020-01-17 DIAGNOSIS — Z23 Encounter for immunization: Secondary | ICD-10-CM | POA: Diagnosis not present

## 2020-01-17 DIAGNOSIS — R7303 Prediabetes: Secondary | ICD-10-CM

## 2020-01-17 DIAGNOSIS — D508 Other iron deficiency anemias: Secondary | ICD-10-CM

## 2020-01-17 DIAGNOSIS — N400 Enlarged prostate without lower urinary tract symptoms: Secondary | ICD-10-CM | POA: Diagnosis not present

## 2020-01-17 DIAGNOSIS — S22000A Wedge compression fracture of unspecified thoracic vertebra, initial encounter for closed fracture: Secondary | ICD-10-CM

## 2020-01-17 DIAGNOSIS — E785 Hyperlipidemia, unspecified: Secondary | ICD-10-CM

## 2020-01-17 DIAGNOSIS — N1832 Chronic kidney disease, stage 3b: Secondary | ICD-10-CM

## 2020-01-17 DIAGNOSIS — I10 Essential (primary) hypertension: Secondary | ICD-10-CM | POA: Diagnosis not present

## 2020-01-17 DIAGNOSIS — Z0000 Encounter for general adult medical examination without abnormal findings: Secondary | ICD-10-CM | POA: Diagnosis not present

## 2020-01-17 LAB — CBC WITH DIFFERENTIAL/PLATELET
Basophils Absolute: 0 10*3/uL (ref 0.0–0.1)
Basophils Relative: 0.6 % (ref 0.0–3.0)
Eosinophils Absolute: 0.2 10*3/uL (ref 0.0–0.7)
Eosinophils Relative: 2.4 % (ref 0.0–5.0)
HCT: 40.7 % (ref 39.0–52.0)
Hemoglobin: 13.9 g/dL (ref 13.0–17.0)
Lymphocytes Relative: 16.3 % (ref 12.0–46.0)
Lymphs Abs: 1.2 10*3/uL (ref 0.7–4.0)
MCHC: 34.1 g/dL (ref 30.0–36.0)
MCV: 97.8 fl (ref 78.0–100.0)
Monocytes Absolute: 0.5 10*3/uL (ref 0.1–1.0)
Monocytes Relative: 7.3 % (ref 3.0–12.0)
Neutro Abs: 5.4 10*3/uL (ref 1.4–7.7)
Neutrophils Relative %: 73.4 % (ref 43.0–77.0)
Platelets: 120 10*3/uL — ABNORMAL LOW (ref 150.0–400.0)
RBC: 4.16 Mil/uL — ABNORMAL LOW (ref 4.22–5.81)
RDW: 14.2 % (ref 11.5–15.5)
WBC: 7.3 10*3/uL (ref 4.0–10.5)

## 2020-01-17 LAB — BASIC METABOLIC PANEL
BUN: 20 mg/dL (ref 6–23)
CO2: 27 mEq/L (ref 19–32)
Calcium: 9.1 mg/dL (ref 8.4–10.5)
Chloride: 104 mEq/L (ref 96–112)
Creatinine, Ser: 1.35 mg/dL (ref 0.40–1.50)
GFR: 50.48 mL/min — ABNORMAL LOW (ref >60.00)
Glucose, Bld: 88 mg/dL (ref 70–99)
Potassium: 3.9 mEq/L (ref 3.5–5.1)
Sodium: 138 mEq/L (ref 135–145)

## 2020-01-17 LAB — LIPID PANEL
Cholesterol: 118 mg/dL (ref 0–200)
HDL: 29.5 mg/dL — ABNORMAL LOW (ref >39.00)
NonHDL: 88.61
Total CHOL/HDL Ratio: 4
Triglycerides: 220 mg/dL — ABNORMAL HIGH (ref 0.0–149.0)
VLDL: 44 mg/dL — ABNORMAL HIGH (ref 0.0–40.0)

## 2020-01-17 LAB — HEPATIC FUNCTION PANEL
ALT: 26 U/L (ref 0–53)
AST: 24 U/L (ref 0–37)
Albumin: 4.3 g/dL (ref 3.5–5.2)
Alkaline Phosphatase: 34 U/L — ABNORMAL LOW (ref 39–117)
Bilirubin, Direct: 0.1 mg/dL (ref 0.0–0.3)
Total Bilirubin: 0.5 mg/dL (ref 0.2–1.2)
Total Protein: 6.8 g/dL (ref 6.0–8.3)

## 2020-01-17 LAB — IRON: Iron: 96 ug/dL (ref 42–165)

## 2020-01-17 LAB — HEMOGLOBIN A1C: Hgb A1c MFr Bld: 5.7 % (ref 4.6–6.5)

## 2020-01-17 LAB — LDL CHOLESTEROL, DIRECT: Direct LDL: 59 mg/dL

## 2020-01-17 LAB — FERRITIN: Ferritin: 125.6 ng/mL (ref 22.0–322.0)

## 2020-01-17 LAB — TSH: TSH: 2.21 u[IU]/mL (ref 0.35–4.50)

## 2020-01-17 LAB — PSA: PSA: 0.1 ng/mL (ref 0.10–4.00)

## 2020-01-17 NOTE — Patient Instructions (Signed)

## 2020-01-17 NOTE — Progress Notes (Signed)
Subjective:  Patient ID: Frank Hoover., male    DOB: [birth year]  Age: 79 y.o. MRN: [id number]  CC: Annual Exam, Atrial Fibrillation, Back Pain, Hyperlipidemia, Anemia, and Hypertension  This visit occurred during the SARS-CoV-2 public health emergency.  Safety protocols were in place, including screening questions prior to the visit, additional usage of staff PPE, and extensive cleaning of exam room while observing appropriate contact time as indicated for disinfecting solutions.    HPI [name] presents for a CPX.  He is very active and denies any recent episodes of chest pain, shortness of breath, palpitations, edema, or fatigue.  He has a history of renal cell carcinoma and T12/L1 fractures.  He tells me his back pain is much better but he would like to have x-rays done of his lower back to see if the fractures are healing.  When he does have back pain it does not radiate into his lower extremities and he denies paresthesias.  He tells me his blood pressure is adequately well controlled.  Outpatient Medications Prior to Visit  Medication Sig Dispense Refill  . apixaban (ELIQUIS) 5 MG TABS tablet Take 1 tablet (5 mg total) by mouth 2 (two) times daily. 180 tablet 3  . Calcium Carbonate (CALCIUM 500 PO) Take 500 mg by mouth 2 (two) times daily.    Marland Kitchen denosumab (PROLIA) 60 MG/ML SOSY injection Inject 60 mg into the skin every 6 (six) months.    . esomeprazole (NEXIUM) 40 MG capsule Take 1 capsule (40 mg total) by mouth daily before breakfast. 90 capsule 2  . Multiple Vitamins-Minerals (CENTRUM MULTI + OMEGA 3 PO) Take 1 tablet by mouth daily.     Marland Kitchen omega-3 acid ethyl esters (LOVAZA) 1 g capsule Take 2 capsules by mouth twice daily 360 capsule 1  . rosuvastatin (CRESTOR) 10 MG tablet Take 1 tablet by mouth once daily 90 tablet 1  . VITAMIN D PO Take 500 Units by mouth daily.    . nabumetone (RELAFEN) 500 MG tablet Take 1 tablet (500 mg total) by mouth 2 (two) times daily as  needed. (Patient not taking: Reported on 01/18/2020) 180 tablet 1  . solifenacin (VESICARE) 10 MG tablet Take 1 tablet (10 mg total) by mouth daily. (Patient not taking: Reported on 01/18/2020) 90 tablet 1   No facility-administered medications prior to visit.    ROS Review of Systems  Constitutional: Negative.  Negative for chills, diaphoresis, fatigue and fever.  HENT: Negative.   Eyes: Negative for visual disturbance.  Respiratory: Negative for cough, chest tightness, shortness of breath and wheezing.   Cardiovascular: Negative for chest pain, palpitations and leg swelling.  Gastrointestinal: Negative for abdominal pain, constipation, diarrhea, nausea and vomiting.  Endocrine: Negative.   Genitourinary: Negative.  Negative for difficulty urinating, dysuria and hematuria.  Musculoskeletal: Positive for back pain. Negative for arthralgias and myalgias.  Skin: Negative.  Negative for color change and pallor.  Neurological: Negative.  Negative for dizziness, weakness, light-headedness, numbness and headaches.  Hematological: Negative for adenopathy. Does not bruise/bleed easily.  Psychiatric/Behavioral: Negative.     Objective:  BP 118/76   Pulse 67   Temp 98.2 F (36.8 C) (Oral)   Ht 5' 3.5" (1.613 m)   Wt 183 lb (83 kg)   SpO2 96%   BMI 31.91 kg/m   BP Readings from Last 3 Encounters:  01/18/20 118/68  01/17/20 118/76  08/17/19 (!) 131/96    Wt Readings from Last 3 Encounters:  01/18/20  185 lb (83.9 kg)  01/17/20 183 lb (83 kg)  08/17/19 186 lb 3.2 oz (84.5 kg)    Physical Exam Vitals reviewed.  Constitutional:      Appearance: Normal appearance.  HENT:     Nose: Nose normal.     Mouth/Throat:     Mouth: Mucous membranes are moist.  Eyes:     General: No scleral icterus.    Conjunctiva/sclera: Conjunctivae normal.  Cardiovascular:     Rate and Rhythm: Normal rate and regular rhythm.     Heart sounds: No murmur heard.   Pulmonary:     Effort: Pulmonary  effort is normal.     Breath sounds: No stridor. No wheezing, rhonchi or rales.  Abdominal:     General: Abdomen is flat. Bowel sounds are normal. There is no distension.     Palpations: Abdomen is soft. There is no hepatomegaly, splenomegaly or mass.     Tenderness: There is no abdominal tenderness.  Musculoskeletal:        General: Normal range of motion.     Cervical back: Normal and neck supple.     Thoracic back: Normal.     Lumbar back: Normal. No edema, signs of trauma, tenderness or bony tenderness. Normal range of motion. Negative right straight leg raise test and negative left straight leg raise test.     Right lower leg: No edema.     Left lower leg: No edema.  Lymphadenopathy:     Cervical: No cervical adenopathy.  Skin:    General: Skin is warm and dry.     Coloration: Skin is not pale.  Neurological:     General: No focal deficit present.     Mental Status: He is alert.  Psychiatric:        Mood and Affect: Mood normal.        Behavior: Behavior normal.     Lab Results  Component Value Date   WBC 7.3 01/17/2020   HGB 13.9 01/17/2020   HCT 40.7 01/17/2020   PLT 120.0 (L) 01/17/2020   GLUCOSE 88 01/17/2020   CHOL 118 01/17/2020   TRIG 220.0 (H) 01/17/2020   HDL 29.50 (L) 01/17/2020   LDLDIRECT 59.0 01/17/2020   LDLCALC 113 (H) 01/15/2019   ALT 26 01/17/2020   AST 24 01/17/2020   NA 138 01/17/2020   K 3.9 01/17/2020   CL 104 01/17/2020   CREATININE 1.35 01/17/2020   BUN 20 01/17/2020   CO2 27 01/17/2020   TSH 2.21 01/17/2020   PSA 0.10 01/17/2020   INR 1.2 10/11/2018   HGBA1C 5.7 01/17/2020    US BREAST LTD UNI LEFT INC AXILLA  Result Date: 04/26/2019 CLINICAL DATA:  79 year old male presenting with a new palpable area and tenderness of concern in the retroareolar right breast. EXAM: DIGITAL DIAGNOSTIC BILATERAL MAMMOGRAM WITH CAD AND TOMO ULTRASOUND LEFT BREAST COMPARISON:  Previous exam(s). ACR Breast Density Category b: There are scattered areas of  fibroglandular density. FINDINGS: Mammogram: Right breast: A skin BB marks the palpable site of concern in the retroareolar right breast. There is flame shaped fibroglandular tissue in the retroareolar breast which is asymmetrically increased compared to the left side. No suspicious solid mass. No microcalcification. Left breast: There is a small amount of breast tissue in the retroareolar left breast. In the upper outer there is a round mass measuring 0.5 cm which persists on spot compression views. Mammographic images were processed with CAD. On physical exam, I palpate firmness in the retroareolar  right breast without a fixed discrete mass. There are no skin changes. Ultrasound: Targeted ultrasound is performed in the left breast at 2:30 o'clock 4 cm from the nipple demonstrating an oval circumscribed hypoechoic mass with fatty hilum measuring 0.3 x 0.2 x 0.3 cm, consistent with an intramammary lymph node. IMPRESSION: 1. Asymmetric right greater than left gynecomastia which explain the patient's palpable area of concern in the retroareolar right breast. No mammographic evidence of malignancy. 2. Left breast mass at 2:30 o'clock measuring 0 point 3 cm consistent with a benign intramammary lymph node. RECOMMENDATION: Clinical follow-up for the right breast gynecomastia. Patient feels this may be related to a recent Prolia shot. I discussed with the patient the fact that gynecomastia can occur in men as testosterone levels decrease with age or in younger men with low testosterone levels, causing a change in the serum testosterone:estrogen ratio. We also discussed other potential etiologies of gynecomastia including numerous prescription medications and recreational drugs (marijuana and anabolic steroids in particular). Approximately 20% of cases of gynecomastia are idiopathic. I counseled the patient to perform self-examination to make sure that a hard lump does not develop which could indicate malignancy and would  need further evaluation. BI-RADS CATEGORY  2: Benign. Electronically Signed   By: Audie Pinto M.D.   On: 04/26/2019 15:03   MM DIAG BREAST TOMO BILATERAL  Result Date: 04/26/2019 CLINICAL DATA:  79 year old male presenting with a new palpable area and tenderness of concern in the retroareolar right breast. EXAM: DIGITAL DIAGNOSTIC BILATERAL MAMMOGRAM WITH CAD AND TOMO ULTRASOUND LEFT BREAST COMPARISON:  Previous exam(s). ACR Breast Density Category b: There are scattered areas of fibroglandular density. FINDINGS: Mammogram: Right breast: A skin BB marks the palpable site of concern in the retroareolar right breast. There is flame shaped fibroglandular tissue in the retroareolar breast which is asymmetrically increased compared to the left side. No suspicious solid mass. No microcalcification. Left breast: There is a small amount of breast tissue in the retroareolar left breast. In the upper outer there is a round mass measuring 0.5 cm which persists on spot compression views. Mammographic images were processed with CAD. On physical exam, I palpate firmness in the retroareolar right breast without a fixed discrete mass. There are no skin changes. Ultrasound: Targeted ultrasound is performed in the left breast at 2:30 o'clock 4 cm from the nipple demonstrating an oval circumscribed hypoechoic mass with fatty hilum measuring 0.3 x 0.2 x 0.3 cm, consistent with an intramammary lymph node. IMPRESSION: 1. Asymmetric right greater than left gynecomastia which explain the patient's palpable area of concern in the retroareolar right breast. No mammographic evidence of malignancy. 2. Left breast mass at 2:30 o'clock measuring 0 point 3 cm consistent with a benign intramammary lymph node. RECOMMENDATION: Clinical follow-up for the right breast gynecomastia. Patient feels this may be related to a recent Prolia shot. I discussed with the patient the fact that gynecomastia can occur in men as testosterone levels  decrease with age or in younger men with low testosterone levels, causing a change in the serum testosterone:estrogen ratio. We also discussed other potential etiologies of gynecomastia including numerous prescription medications and recreational drugs (marijuana and anabolic steroids in particular). Approximately 20% of cases of gynecomastia are idiopathic. I counseled the patient to perform self-examination to make sure that a hard lump does not develop which could indicate malignancy and would need further evaluation. BI-RADS CATEGORY  2: Benign. Electronically Signed   By: Audie Pinto M.D.   On: 04/26/2019 15:03  DG Thoracic Spine W/Swimmers  Result Date: 01/18/2020 CLINICAL DATA:  Fracture follow-up, subsequent encounter. EXAM: THORACIC SPINE - 3 VIEWS COMPARISON:  06/25/2018. FINDINGS: Old T12 and L1 compression deformities. Vertebral body height is otherwise maintained. Mild diffuse endplate sclerosis. Cervicothoracic junction is in alignment. Incidental imaging of the chest shows bullous disease at the right apex. IMPRESSION: 1. No acute findings. 2. Mild diffuse endplate sclerosis. Electronically Signed   By: Lorin Picket M.D.   On: 01/18/2020 15:23   DG Lumbar Spine Complete  Result Date: 01/18/2020 CLINICAL DATA:  Chronic pain, fracture follow-up, subsequent encounter. EXAM: LUMBAR SPINE - COMPLETE 4+ VIEW COMPARISON:  Lumbar spine MR 06/25/2018. FINDINGS: Mild dextroconvex curvature centered at the T12-L1 junction. Alignment is otherwise anatomic. Old T12 and L1 compression fractures. Vertebral body height is otherwise maintained. Endplate degenerative changes along the L2 superior endplate. Facet hypertrophy in the lower lumbar spine. No definite pars defects. Atherosclerotic calcification of the aorta. IMPRESSION: 1. Endplate degenerative changes at L2. 2. Facet hypertrophy in the lower lumbar spine. 3.  Aortic atherosclerosis (ICD10-I70.0). Electronically Signed   By: Lorin Picket M.D.   On: 01/18/2020 15:21    Assessment & Plan:   Naoki was seen today for annual exam, atrial fibrillation, back pain, hyperlipidemia, anemia and hypertension.  Diagnoses and all orders for this visit:  Flu vaccine need -     Flu Vaccine QUAD High Dose(Fluad)  Essential hypertension- His blood pressure is adequately well controlled. -     Basic metabolic panel; Future -     TSH; Future -     TSH -     Basic metabolic panel  Prediabetes- His A1c is normal now. -     Basic metabolic panel; Future -     Hemoglobin A1c; Future -     Hemoglobin A1c -     Basic metabolic panel  Iron deficiency anemia due to dietary causes- His H&H and iron levels are normal now. -     CBC with Differential/Platelet; Future -     Iron; Future -     Ferritin; Future -     Ferritin -     Iron -     CBC with Differential/Platelet  Routine general medical examination at a health care facility- Exam completed, labs reviewed, vaccines reviewed and updated, no cancer screenings are indicated, patient education was given. -     CBC with Differential/Platelet; Future -     CBC with Differential/Platelet  Hyperlipidemia with target LDL less than 70- He has achieved his LDL goal and is doing well on the statin. -     Lipid panel; Future -     Hepatic function panel; Future -     TSH; Future -     TSH -     Hepatic function panel -     Lipid panel  Non-traumatic compression fracture of L1 lumbar vertebra with delayed healing, subsequent encounter- See below. -     DG Lumbar Spine Complete; Future -     DG Thoracic Spine W/Swimmers; Future  Compression fracture of thoracic vertebra, unspecified thoracic vertebral level, initial encounter ([place])- Based on his symptoms, exam, and plain films this has improved. -     DG Lumbar Spine Complete; Future -     DG Thoracic Spine W/Swimmers; Future  Benign prostatic hyperplasia without lower urinary tract symptoms- His PSA is low which is a reassuring  sign that he does not have prostate cancer.  He has no  symptoms that need to be treated. -     PSA; Future -     PSA  Stage 3b chronic kidney disease (HCC)-his blood pressure is adequately well controlled.  I recommended that he avoid nephrotoxic agents.  Other orders -     Pneumococcal polysaccharide vaccine 23-valent greater than or equal to 2yo subcutaneous/IM -     LDL cholesterol, direct   I am having Frank Hoover. "Frank [name]" maintain his Calcium Carbonate (CALCIUM 500 PO), VITAMIN D PO, Multiple Vitamins-Minerals (CENTRUM MULTI + OMEGA 3 PO), denosumab, apixaban, esomeprazole, omega-3 acid ethyl esters, and rosuvastatin.  No orders of the defined types were placed in this encounter.  In addition to time spent on CPE, I spent 50 minutes in preparing to see the patient by review of recent labs, imaging and procedures, obtaining and reviewing separately obtained history, communicating with the patient and family or caregiver, ordering medications, tests or procedures, and documenting clinical information in the EHR including the differential Dx, treatment, and any further evaluation and other management of 1. Essential hypertension 2. Prediabetes 3. Iron deficiency anemia due to dietary causes 4. Hyperlipidemia with target LDL less than 70 5. Non-traumatic compression fracture of L1 lumbar vertebra with delayed healing, subsequent encounter 6. Compression fracture of thoracic vertebra, unspecified thoracic vertebral level, initial encounter ([place]) 7. Benign prostatic hyperplasia without lower urinary tract symptoms 8. Stage 3b chronic kidney disease ([place])     Follow-up: Return in about 6 months (around 07/16/2020).  Scarlette Calico, MD

## 2020-01-18 ENCOUNTER — Encounter: Payer: Self-pay | Admitting: Physician Assistant

## 2020-01-18 ENCOUNTER — Ambulatory Visit: Payer: Medicare Other | Admitting: Physician Assistant

## 2020-01-18 VITALS — BP 118/68 | HR 60 | Ht 63.5 in | Wt 185.0 lb

## 2020-01-18 DIAGNOSIS — Z950 Presence of cardiac pacemaker: Secondary | ICD-10-CM | POA: Diagnosis not present

## 2020-01-18 DIAGNOSIS — I480 Paroxysmal atrial fibrillation: Secondary | ICD-10-CM

## 2020-01-18 DIAGNOSIS — I495 Sick sinus syndrome: Secondary | ICD-10-CM | POA: Diagnosis not present

## 2020-01-18 DIAGNOSIS — N1832 Chronic kidney disease, stage 3b: Secondary | ICD-10-CM | POA: Insufficient documentation

## 2020-01-18 HISTORY — DX: Chronic kidney disease, stage 3b: N18.32

## 2020-01-18 NOTE — Progress Notes (Signed)
Remote pacemaker transmission.   

## 2020-01-18 NOTE — Progress Notes (Signed)
Cardiology Office Note Date:  01/18/2020  Patient ID:  [name], DOB 1940/05/29, MRN [id number] PCP:  [name], MD  Cardiologist:  Dr. Acie Fredrickson Electrophysiologist: Dr. Rayann Heman     Chief Complaint: annual visit  History of Present Illness: [name] is a 79 y.o. male with history of adrenal cancer (s/p L nephrectomy/adrenalectomy), breast mass, AFib, h/o syncope, SSSx w/PPM, HLD  He comes in today to be seen for dr. Rayann Heman, last seen by him Spt 2020 via tele health.  Doing well, no changes were made, planned fir annual visit,  More recently he saw Dr. Acie Fredrickson Feb 2021.  Unfortunately continued with back pain after a fracture, had noted a R breast mass, was advised to see a surgeon for this.  TODAY He is doing very well.  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  He mentions his back is much better and very happy about that He denies any CP or palpitations, no cardiac awareness. No dizzy spells, near syncope or syncope No SOB or DOE  He denies any bleeding or signs of bleeding, saw his PMD had labs and back XRays done.   Device information 07/18/2018 MDT dual chamber PPM  (loop removed)  AFib Hx Diagnosis goes back to the start of his Epic record (2013) AAD None to date that I can find  Past Medical History:  Diagnosis Date   Diverticulosis    Esophageal reflux    Esophageal stricture    HA (headache)    Hearing loss    Hiatal hernia    Hypertriglyceridemia    Hypoglycemia    Obesity, unspecified    PAF (paroxysmal atrial fibrillation) (HCC)    Echo with normal LV function and mild LVH   Presence of permanent cardiac pacemaker    Renal mass    left   Syncope    presented with atrial fib converted with Diltiazem   Systemic inflammatory response syndrome ([place]) 06/26/2018    Past Surgical History:  Procedure Laterality Date   CYSTOSCOPY WITH URETEROSCOPY AND STENT PLACEMENT  Left 08/16/2018   Procedure: CYSTOSCOPY WITH LEFT RETROGRADE PYELOGRAM, BALLOON DILATION LEFT URETER, STENT PLACEMENT;  Surgeon: Lucas Mallow, MD;  Location: [facility];  Service: Urology;  Laterality: Left;   HERNIA REPAIR     INSERT / REPLACE / REMOVE PACEMAKER     LAPAROSCOPIC ASSISTED VENTRAL HERNIA REPAIR N/A 10/18/2018   Procedure: LAPAROSCOPIC ASSISTED VENTRAL WALL HERNIA REPAIR WITH MESH;  Surgeon: Michael Boston, MD;  Location: [facility];  Service: General;  Laterality: N/A;   LAPAROSCOPIC NEPHRECTOMY, HAND ASSISTED Left 10/18/2018   Procedure: LEFT HAND ASSISTED LAPAROSCOPIC RADICAL NEPHRECTOMY WITH ADRENALECTOMY;  Surgeon: Lucas Mallow, MD;  Location: [facility];  Service: Urology;  Laterality: Left;   LOOP RECORDER INSERTION N/A 04/26/2018   Procedure: LOOP RECORDER INSERTION;  Surgeon: Thompson Grayer, MD;  Location: [facility];  Service: Cardiovascular;  Laterality: N/A;   LOOP RECORDER REMOVAL N/A 07/18/2018   Procedure: LOOP RECORDER REMOVAL;  Surgeon: Thompson Grayer, MD;  Location: [facility];  Service: Cardiovascular;  Laterality: N/A;   NASAL SEPTUM SURGERY     PACEMAKER IMPLANT N/A 07/18/2018   Procedure: PACEMAKER IMPLANT;  Surgeon: Thompson Grayer, MD;  Location: [facility];  Service: Cardiovascular;  Laterality: N/A;   [place]  WISDOM TOOTH EXTRACTION     about 79 years old    Current Outpatient Medications  Medication Sig Dispense Refill   apixaban (ELIQUIS) 5 MG TABS tablet Take 1 tablet (5 mg total) by mouth 2 (two) times daily. 180 tablet 3   Calcium Carbonate (CALCIUM 500 PO) Take 500 mg by mouth 2 (two) times daily.     denosumab (PROLIA) 60 MG/ML SOSY injection Inject 60 mg into the skin every 6 (six) months.     esomeprazole (NEXIUM) 40 MG capsule Take 1 capsule (40 mg total) by mouth daily before breakfast. 90 capsule 2   Multiple Vitamins-Minerals (CENTRUM MULTI +  OMEGA 3 PO) Take 1 tablet by mouth daily.      nabumetone (RELAFEN) 500 MG tablet Take 500 mg by mouth 2 (two) times daily as needed for moderate pain.     omega-3 acid ethyl esters (LOVAZA) 1 g capsule Take 2 capsules by mouth twice daily 360 capsule 1   rosuvastatin (CRESTOR) 10 MG tablet Take 1 tablet by mouth once daily 90 tablet 1   solifenacin (VESICARE) 10 MG tablet Take 10 mg by mouth every evening.     VITAMIN D PO Take 500 Units by mouth daily.     No current facility-administered medications for this visit.    Allergies:   Hydrocodone, Monosodium glutamate, Naproxen sodium, and Other   Social History:  The patient  reports that he quit smoking about 32 years ago. He has never used smokeless tobacco. He reports that he does not drink alcohol and does not use drugs.   Family History:  The patient's family history includes Bipolar disorder in his paternal grandmother and sister; Emphysema in his father; Gallbladder disease in his sister.  ROS:  Please see the history of present illness.    All other systems are reviewed and otherwise negative.   PHYSICAL EXAM:  VS:  BP 118/68    Pulse 60    Ht 5' 3.5" (1.613 m)    Wt 185 lb (83.9 kg)    SpO2 96%    BMI 32.26 kg/m  BMI: Body mass index is 32.26 kg/m. Well nourished, well developed, in no acute distress HEENT: normocephalic, atraumatic Neck: no JVD, carotid bruits or masses Cardiac:  RRR; no significant murmurs, no rubs, or gallops Lungs:  CTA b/l, no wheezing, rhonchi or rales Abd: soft, nontender MS: no deformity or atrophy Ext: no edema Skin: warm and dry, no rash Neuro:  No gross deficits appreciated Psych: euthymic mood, full affect  PPM site is stable, no tethering or discomfort   EKG:  Done today and reviewed by myself shows  AP/VS 60bpm  Device interrogation done today and reviewed by myself:  Battery and lead measurements are good AF burden <0.1% 2 AF episodes 11/04/19 just over  17 hours 08/02/19 just  over 16hours   06/26/2018: TTE IMPRESSIONS  1. The left ventricle has normal systolic function, with an ejection  fraction of 55-60%. The cavity size was normal. There is mildly increased  left ventricular wall thickness. Left ventricular diastolic function could  not be evaluated. No evidence of  left ventricular regional wall motion abnormalities.  2. The right ventricle has normal systolc function. The cavity was  normal. There is no increase in right ventricular wall thickness.  3. No evidence of mitral valve stenosis. Trivial mitral regurgitation.  4. The aortic valve is tricuspid Mild calcification of the aortic valve.  Aortic valve regurgitation is trivial by color flow Doppler.  No stenosis  of the aortic valve.  5. The aortic root is normal in size and structure.  6. Left atrial size was mild-moderately dilated.  7. The IVC was normal in size. No complete TR doppler jet so unable to  estimate PA systolic pressure.    Recent Labs: 01/17/2020: ALT 26; BUN 20; Creatinine, Ser 1.35; Hemoglobin 13.9; Platelets 120.0; Potassium 3.9; Sodium 138; TSH 2.21  01/17/2020: Cholesterol 118; Direct LDL 59.0; HDL 29.50; Total CHOL/HDL Ratio 4; Triglycerides 220.0; VLDL 44.0   Estimated Creatinine Clearance: 44.3 mL/min (by C-G formula based on SCr of 1.35 mg/dL).   Wt Readings from Last 3 Encounters:  01/18/20 185 lb (83.9 kg)  01/17/20 183 lb (83 kg)  08/17/19 186 lb 3.2 oz (84.5 kg)     Other studies reviewed: Additional studies/records reviewed today include: summarized above  ASSESSMENT AND PLAN:  1. PPM     Intact function, no programming changes made  2. Paroxysmal AFib     CHA2DS2Vasc is 2 (age), on Eliquis, appropriately dosed     Well controlled  3. HLD     Discussed his labs, heart healthy diet, will defer any med changes to his PMD who manages this   Disposition: F/u with remotes as usual and in clinic in a year, sooner if needed.  He tells me he sees his PMD  at least every 41mo.  Current medicines are reviewed at length with the patient today.  The patient did not have any concerns regarding medicines.  Venetia Night, PA-C 01/18/2020 6:05 PM     [address] [place] [address] [phone] (office)  [phone] (fax)

## 2020-01-18 NOTE — Patient Instructions (Signed)
Medication Instructions:   Your physician recommends that you continue on your current medications as directed. Please refer to the Current Medication list given to you today.   *If you need a refill on your cardiac medications before your next appointment, please call your pharmacy*   Lab Work: [place]    If you have labs (blood work) drawn today and your tests are completely normal, you will receive your results only by: Marland Kitchen MyChart Message (if you have MyChart) OR . A paper copy in the mail If you have any lab test that is abnormal or we need to change your treatment, we will call you to review the results.   Testing/Procedures: NONE ORDERED  TODAY   Follow-Up: At [facility], you and your health needs are our priority.  As part of our continuing mission to provide you with exceptional heart care, we have created designated Provider Care Teams.  These Care Teams include your primary Cardiologist (physician) and Advanced Practice Providers (APPs -  Physician Assistants and Nurse Practitioners) who all work together to provide you with the care you need, when you need it.  We recommend signing up for the patient portal called "MyChart".  Sign up information is provided on this After Visit Summary.  MyChart is used to connect with patients for Virtual Visits (Telemedicine).  Patients are able to view lab/test results, encounter notes, upcoming appointments, etc.  Non-urgent messages can be sent to your provider as well.   To learn more about what you can do with MyChart, go to [email or web address].    Your next appointment:   6 month(s)  The format for your next appointment:   In Person  Provider:   You may see Dr. Rayann Heman  or one of the following Advanced Practice Providers on your designated Care Team:    Chanetta Marshall, NP  Tommye Standard, PA-C  Legrand Como "Oda Kilts, [place]    Other Instructions

## 2020-01-27 ENCOUNTER — Other Ambulatory Visit: Payer: Self-pay | Admitting: Internal Medicine

## 2020-01-27 DIAGNOSIS — E781 Pure hyperglyceridemia: Secondary | ICD-10-CM

## 2020-01-28 ENCOUNTER — Telehealth: Payer: Self-pay | Admitting: Student

## 2020-01-28 NOTE — Telephone Encounter (Signed)
Carelink alert with presenting AF. > 6 hours and ongoing at time of transmission. Known PAF on Eliquis.  Called to assess symptoms. Pt is asymptomatic, and as he has previously gone in and out of AF without intervention by review of remotes.   Will plan manual transmission later this week vs early next. If remains out of rhythm or develops symptoms consider attempt at rhythm control. He denies any missed doses of Eliquis.

## 2020-02-01 ENCOUNTER — Telehealth: Payer: Self-pay | Admitting: Internal Medicine

## 2020-02-01 ENCOUNTER — Ambulatory Visit: Payer: Medicare Other | Attending: Internal Medicine

## 2020-02-01 ENCOUNTER — Ambulatory Visit: Payer: Medicare Other

## 2020-02-01 DIAGNOSIS — E785 Hyperlipidemia, unspecified: Secondary | ICD-10-CM

## 2020-02-01 DIAGNOSIS — Z23 Encounter for immunization: Secondary | ICD-10-CM

## 2020-02-01 DIAGNOSIS — I10 Essential (primary) hypertension: Secondary | ICD-10-CM

## 2020-02-01 NOTE — Progress Notes (Signed)
   [facility]  Name:  [name]    MRN: [id number] DOB: [birth year]  02/01/2020  [name] was observed post Covid-19 immunization for 15 minutes without incident. He was provided with Vaccine Information Sheet and instruction to access the V-Safe system.   [name] was instructed to call 911 with any severe reactions post vaccine: Marland Kitchen Difficulty breathing  . Swelling of face and throat  . A fast heartbeat  . A bad rash all over body  . Dizziness and weakness   Immunizations Administered    Name Date Dose VIS Date Route   Pfizer COVID-19 Vaccine 02/01/2020  1:27 PM 0.3 mL 12/19/2019 Intramuscular   Manufacturer: [place]   Lot: [id number]   NDC: [address]-0

## 2020-02-01 NOTE — Progress Notes (Signed)
  Chronic Care Management   Note  02/01/2020 Name: Halston Kintz. MRN: [id number] DOB: 12-Apr-1940  Gwen Her Mattis Featherly. is a 79 y.o. year old male who is a primary care patient of [name], MD. I reached out to [employer]. by phone today in response to a referral sent by Mr. [name] Jr.'s PCP, [name], MD.   Mr. Hanssen was given information about Chronic Care Management services today including:  1. CCM service includes personalized support from designated clinical staff supervised by his physician, including individualized plan of care and coordination with other care providers 2. 24/7 contact phone numbers for assistance for urgent and routine care needs. 3. Service will only be billed when office clinical staff spend 20 minutes or more in a month to coordinate care. 4. Only one practitioner may furnish and bill the service in a calendar month. 5. The patient may stop CCM services at any time (effective at the end of the month) by phone call to the office staff.   Patient agreed to services and verbal consent obtained.   Follow up plan:   Carley Perdue [employer] Scheduler

## 2020-02-06 NOTE — Telephone Encounter (Signed)
Transmission received 02/06/2020

## 2020-02-06 NOTE — Telephone Encounter (Signed)
Follow-up transmission reviewed.  Presenting Rhythm SR today, no reoccurrence of AF since last report.   Spoke with pt, advised of current results and will continue to monitor.

## 2020-02-07 IMAGING — DX DG CHEST 1V PORT
1 series · 1 of 1 positions shown · non-contrast
Comparison: 04/15/2011

CLINICAL DATA: Generalized weakness

EXAM:
PORTABLE CHEST 1 VIEW

[chest ap]
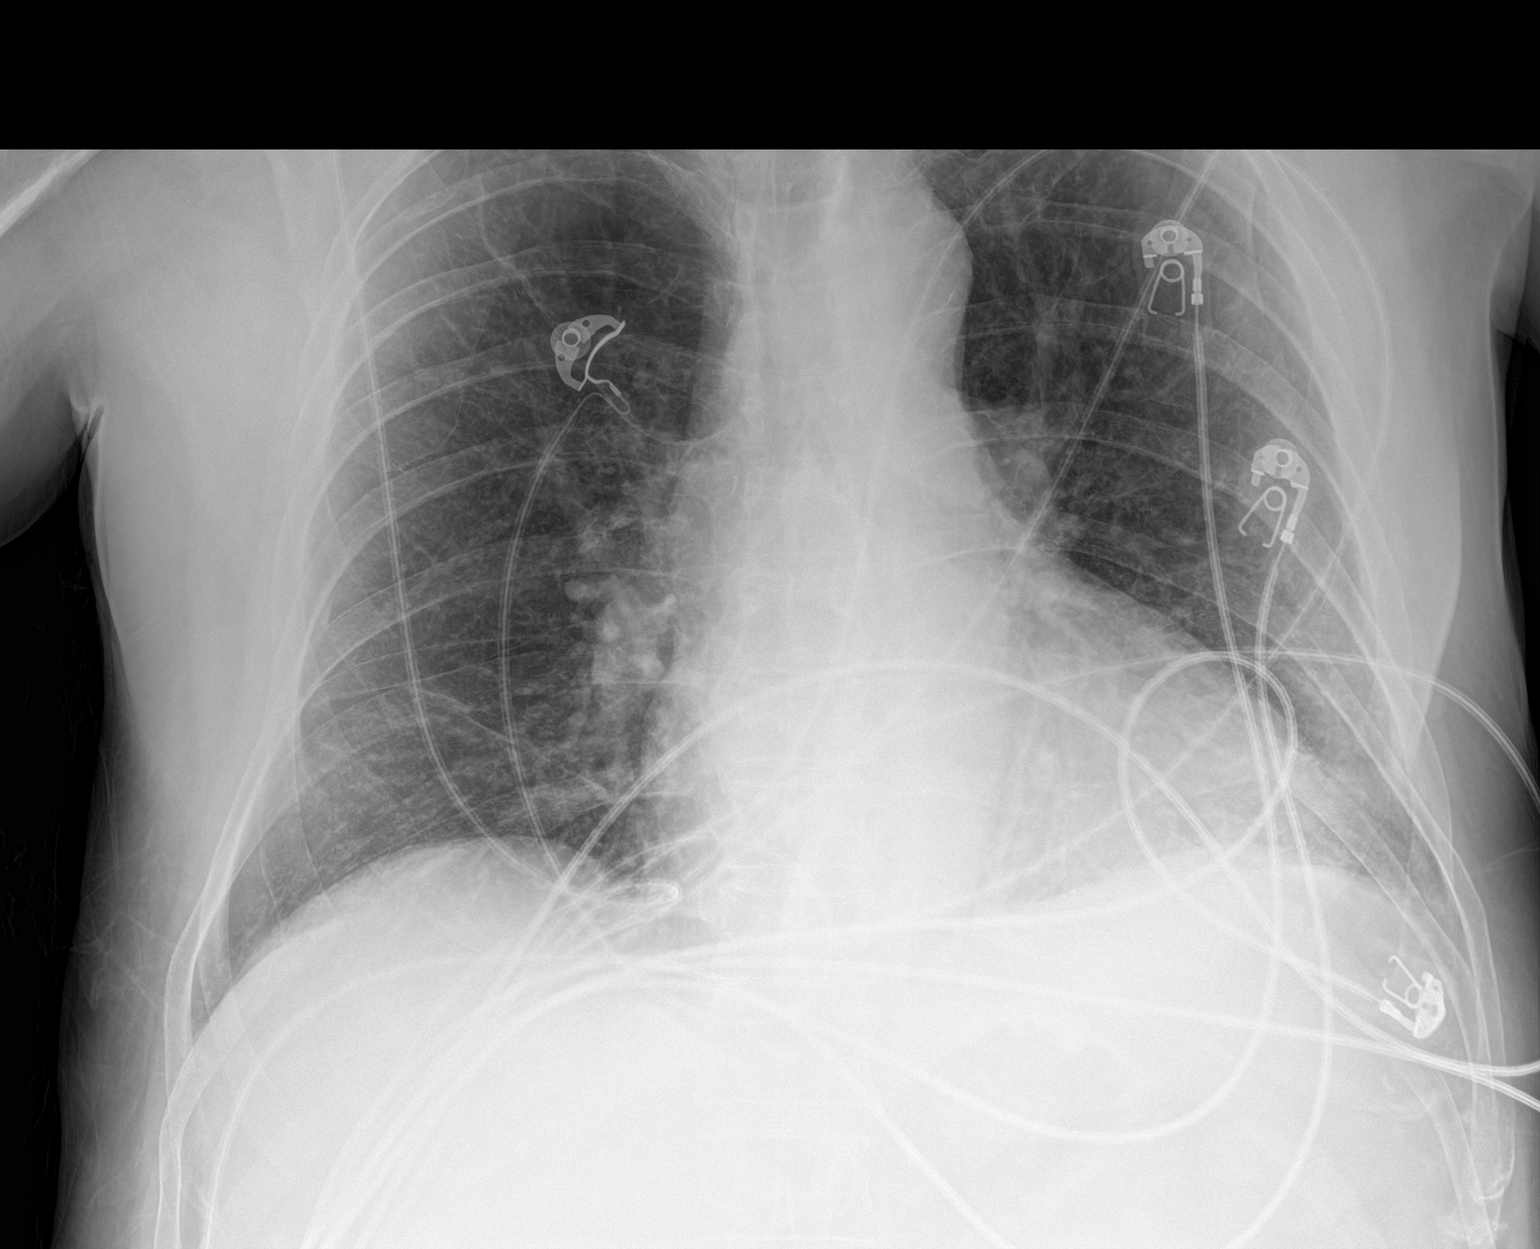

[1 of 1 positions shown; findings below may reference images not displayed]

FINDINGS: Cardiac shadow is stable. The lungs are well aerated bilaterally. No
focal infiltrate or sizable effusion is seen. No bony abnormality is
noted.
IMPRESSION: No acute abnormality seen.

## 2020-02-07 IMAGING — CT CT HEAD W/O CM
4 series · 16 of 47 positions shown, 18 images · non-contrast
Comparison: Brain MRI dated 09/14/2005.

CLINICAL DATA: Headache, syncope.

EXAM:
CT HEAD WITHOUT CONTRAST
TECHNIQUE: Contiguous axial images were obtained from the base of the skull
through the vertex without intravenous contrast.

[Series 3: head without · axial · non-contrast · 0.39mm/px · z∈[+226,+360]mm · 7 of 37 slices shown, 9 images]
[im 5/37  brain]
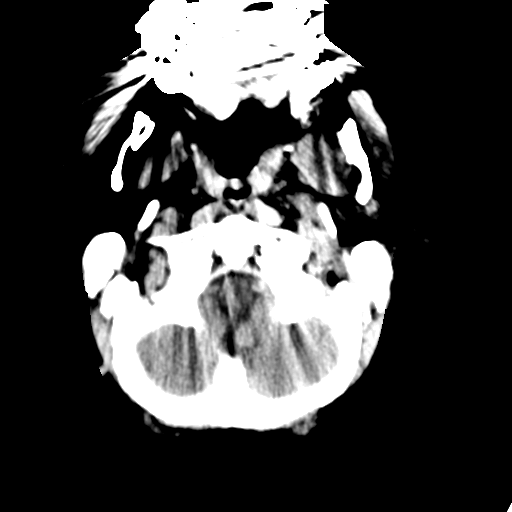
[im 5/37  bone]
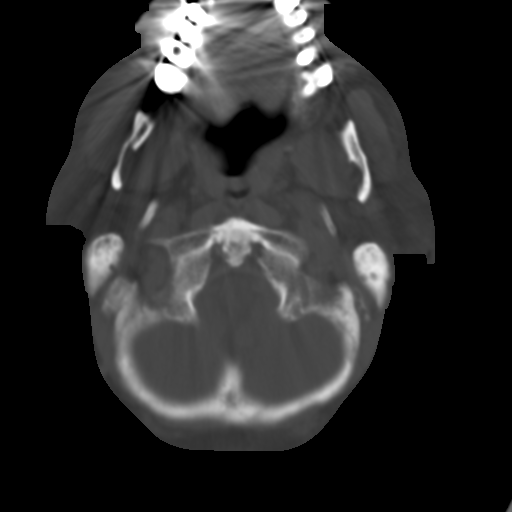
[im 10/37  brain]
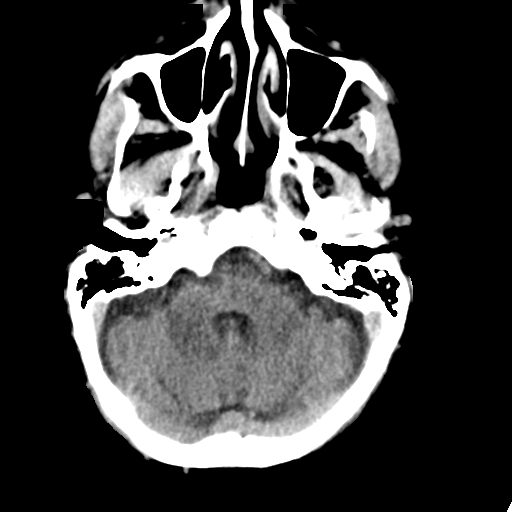
[im 14/37  brain]
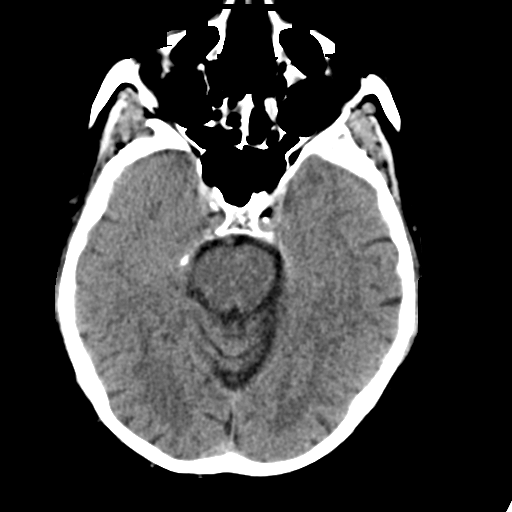
[im 19/37  brain]
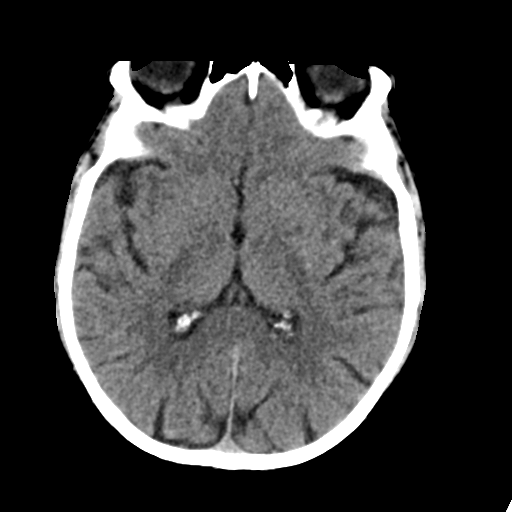
[im 23/37  brain]
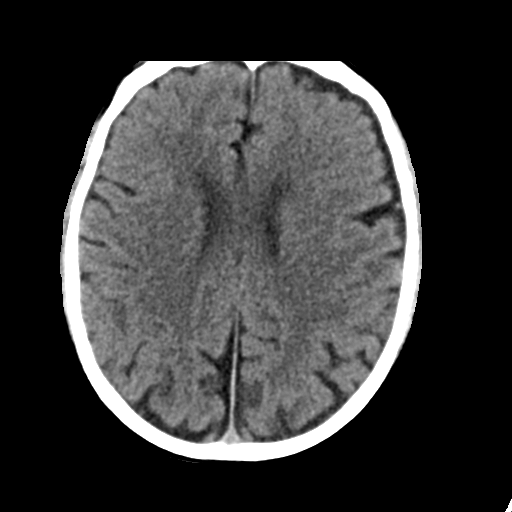
[im 23/37  bone]
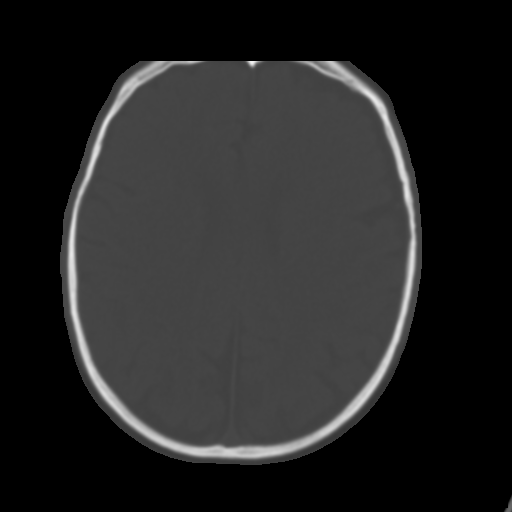
[im 28/37  brain]
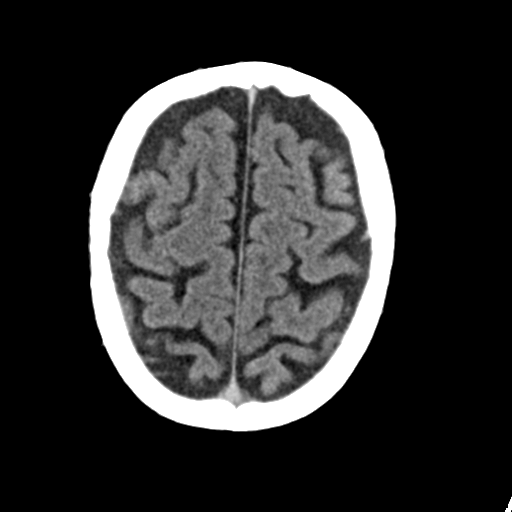
[im 32/37  brain]
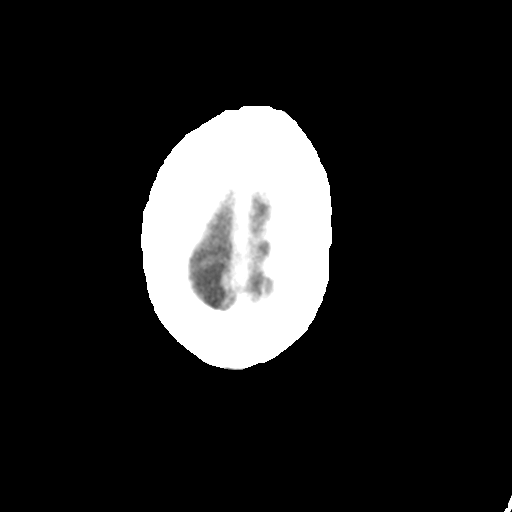

[Series 4: head bone · axial · 0.39mm/px · z∈[+224,+260]mm · 3 of 91 slices shown]
[im 10/91  bone]
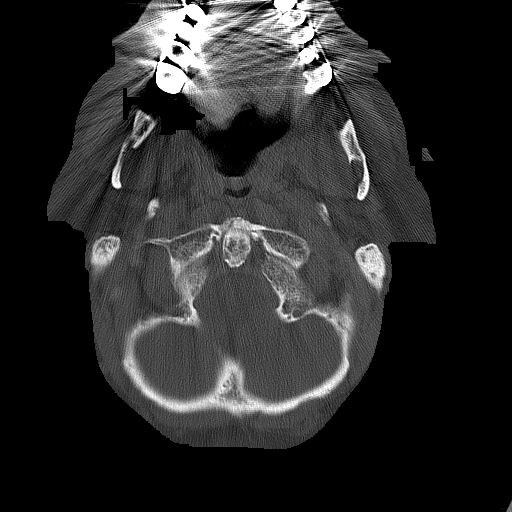
[im 19/91  bone]
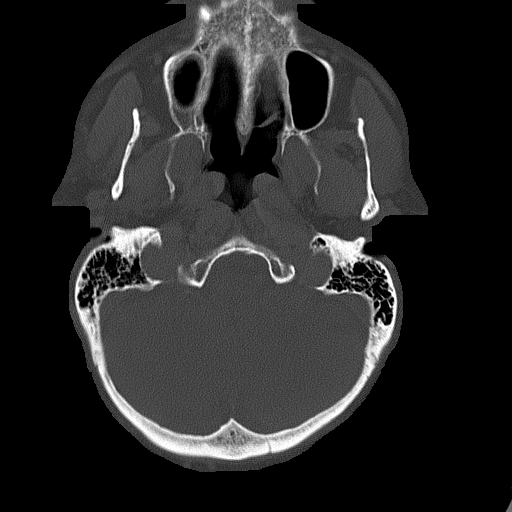
[im 28/91  bone]
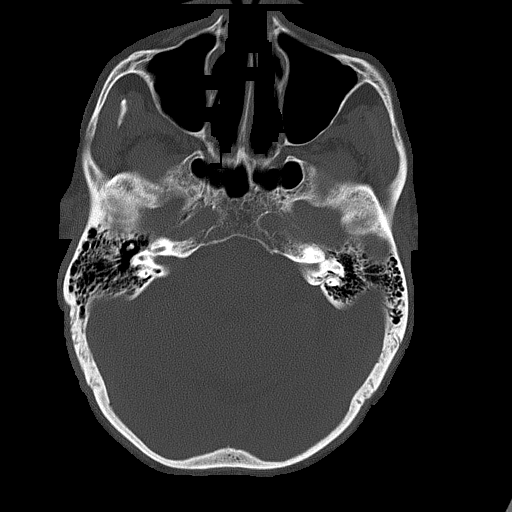

[Series 5: head without cor · coronal · non-contrast · 0.35mm/px · 3 of 67 slices shown]
[im 23/67  brain]
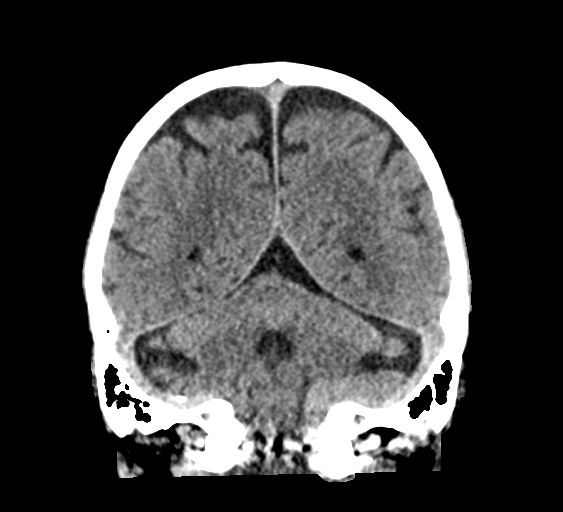
[im 30/67  brain]
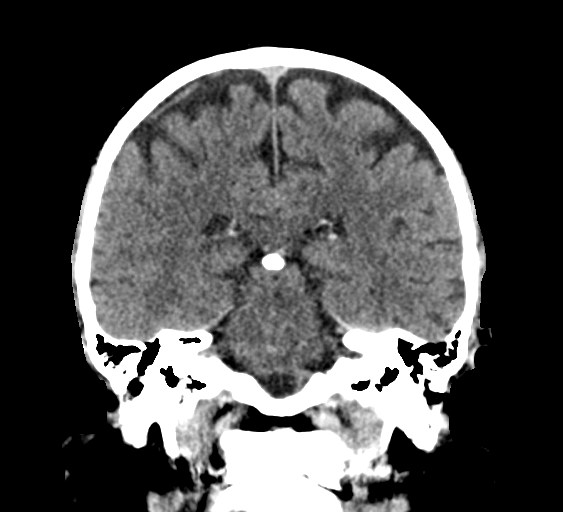
[im 37/67  brain]
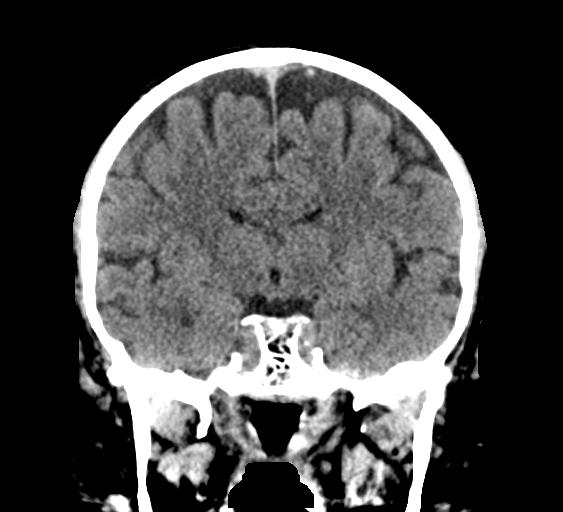

[Series 6: head without sag · sagittal · non-contrast · 0.35mm/px · 3 of 64 slices shown]
[im 22/64  brain]
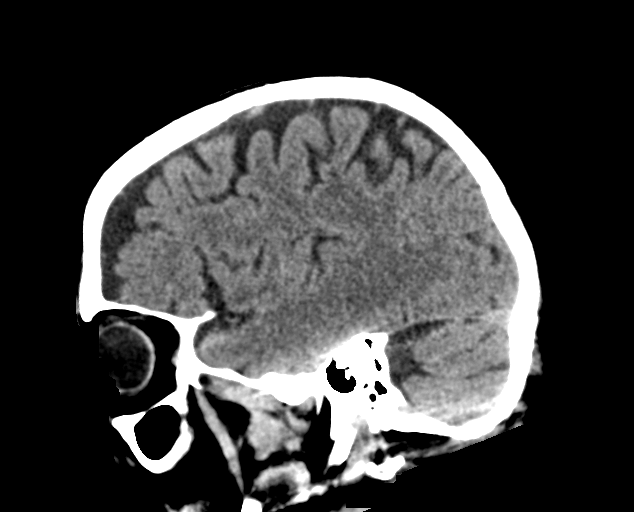
[im 32/64  brain]
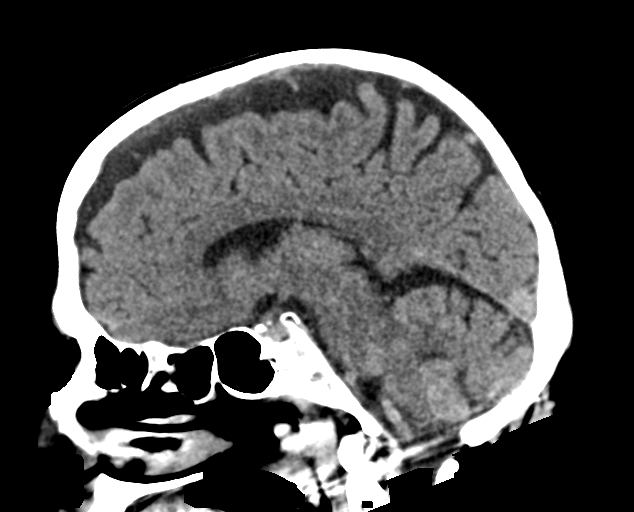
[im 43/64  brain]
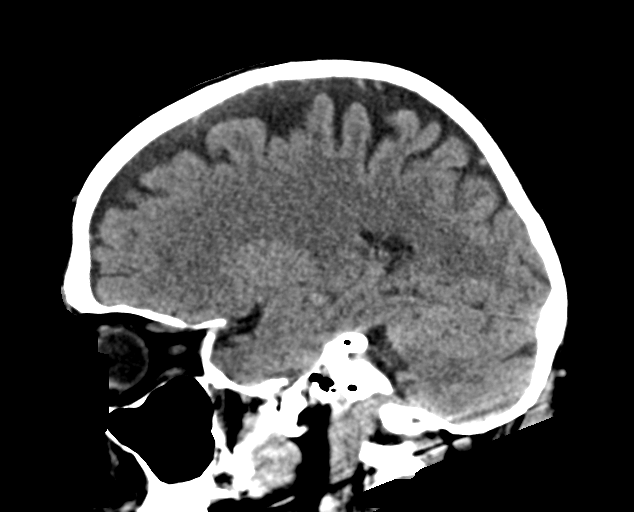

[16 of 47 positions shown; findings below may reference images not displayed]

FINDINGS: Brain: Mild generalized age related parenchymal volume loss with
commensurate dilatation of the sulci. Minimal chronic small vessel
ischemic changes within the deep periventricular white matter
regions.

No mass, hemorrhage, edema or other evidence of acute parenchymal
abnormality. No extra-axial hemorrhage.

Vascular: Chronic calcified atherosclerotic changes of the large
vessels at the skull base. No unexpected hyperdense vessel.

Skull: Normal. Negative for fracture or focal lesion.

Sinuses/Orbits: No acute finding.

Other: None.
IMPRESSION: No acute findings. No intracranial mass, hemorrhage or edema.

## 2020-02-15 ENCOUNTER — Ambulatory Visit (INDEPENDENT_AMBULATORY_CARE_PROVIDER_SITE_OTHER): Payer: Medicare Other

## 2020-02-15 ENCOUNTER — Other Ambulatory Visit: Payer: Self-pay

## 2020-02-15 VITALS — BP 120/70 | HR 84 | Temp 98.2°F | Ht 64.0 in | Wt 185.8 lb

## 2020-02-15 DIAGNOSIS — Z0000 Encounter for general adult medical examination without abnormal findings: Secondary | ICD-10-CM | POA: Diagnosis not present

## 2020-02-15 NOTE — Patient Instructions (Signed)
[name] , Thank you for taking time to come for your Medicare Wellness Visit. I appreciate your ongoing commitment to your health goals. Please review the following plan we discussed and let me know if I can assist you in the future.   Screening recommendations/referrals: Colonoscopy: 08/09/2016 Recommended yearly ophthalmology/optometry visit for glaucoma screening and checkup Recommended yearly dental visit for hygiene and checkup  Vaccinations: Influenza vaccine: 01/17/2020 Pneumococcal vaccine: up to date Tdap vaccine: 04/07/2016; due every 10 years Shingles vaccine: up to date   Covid-19: up to date  Advanced directives: Please bring a copy of your health care power of attorney and living will to the office at your convenience.  Conditions/risks identified: Yes; Reviewed health maintenance screenings with patient today and relevant education, vaccines, and/or referrals were provided. Please continue to do your personal lifestyle choices by: daily care of teeth and gums, regular physical activity (goal should be 5 days a week for 30 minutes), eat a healthy diet, avoid tobacco and drug use, limiting any alcohol intake, taking a low-dose aspirin (if not allergic or have been advised by your provider otherwise) and taking vitamins and minerals as recommended by your provider. Continue doing brain stimulating activities (puzzles, reading, adult coloring books, staying active) to keep memory sharp. Continue to eat heart healthy diet (full of fruits, vegetables, whole grains, lean protein, water--limit salt, fat, and sugar intake) and increase physical activity as tolerated.  Next appointment: Please schedule your next Medicare Wellness Visit with your Nurse Health Advisor in 1 year by calling [phone].  Preventive Care 80 Years and Older, Male Preventive care refers to lifestyle choices and visits with your health care provider that can promote health and wellness. What does preventive care  include?  A yearly physical exam. This is also called an annual well check.  Dental exams once or twice a year.  Routine eye exams. Ask your health care provider how often you should have your eyes checked.  Personal lifestyle choices, including:  Daily care of your teeth and gums.  Regular physical activity.  Eating a healthy diet.  Avoiding tobacco and drug use.  Limiting alcohol use.  Practicing safe sex.  Taking low doses of aspirin every day.  Taking vitamin and mineral supplements as recommended by your health care provider. What happens during an annual well check? The services and screenings done by your health care provider during your annual well check will depend on your age, overall health, lifestyle risk factors, and family history of disease. Counseling  Your health care provider may ask you questions about your:  Alcohol use.  Tobacco use.  Drug use.  Emotional well-being.  Home and relationship well-being.  Sexual activity.  Eating habits.  History of falls.  Memory and ability to understand (cognition).  Work and work Statistician. Screening  You may have the following tests or measurements:  Height, weight, and BMI.  Blood pressure.  Lipid and cholesterol levels. These may be checked every 5 years, or more frequently if you are over 42 years old.  Skin check.  Lung cancer screening. You may have this screening every year starting at age 20 if you have a 30-pack-year history of smoking and currently smoke or have quit within the past 15 years.  Fecal occult blood test (FOBT) of the stool. You may have this test every year starting at age 1.  Flexible sigmoidoscopy or colonoscopy. You may have a sigmoidoscopy every 5 years or a colonoscopy every 10 years starting at age  50.  Prostate cancer screening. Recommendations will vary depending on your family history and other risks.  Hepatitis C blood test.  Hepatitis B blood  test.  Sexually transmitted disease (STD) testing.  Diabetes screening. This is done by checking your blood sugar (glucose) after you have not eaten for a while (fasting). You may have this done every 1-3 years.  Abdominal aortic aneurysm (AAA) screening. You may need this if you are a current or former smoker.  Osteoporosis. You may be screened starting at age 27 if you are at high risk. Talk with your health care provider about your test results, treatment options, and if necessary, the need for more tests. Vaccines  Your health care provider may recommend certain vaccines, such as:  Influenza vaccine. This is recommended every year.  Tetanus, diphtheria, and acellular pertussis (Tdap, Td) vaccine. You may need a Td booster every 10 years.  Zoster vaccine. You may need this after age 77.  Pneumococcal 13-valent conjugate (PCV13) vaccine. One dose is recommended after age 31.  Pneumococcal polysaccharide (PPSV23) vaccine. One dose is recommended after age 19. Talk to your health care provider about which screenings and vaccines you need and how often you need them. This information is not intended to replace advice given to you by your health care provider. Make sure you discuss any questions you have with your health care provider. Document Released: 03/14/2015 Document Revised: 11/05/2015 Document Reviewed: 12/17/2014 Elsevier Interactive Patient Education  2017 [place] Prevention in the Home Falls can cause injuries. They can happen to people of all ages. There are many things you can do to make your home safe and to help prevent falls. What can I do on the outside of my home?  Regularly fix the edges of walkways and driveways and fix any cracks.  Remove anything that might make you trip as you walk through a door, such as a raised step or threshold.  Trim any bushes or trees on the path to your home.  Use bright outdoor lighting.  Clear any walking paths of  anything that might make someone trip, such as rocks or tools.  Regularly check to see if handrails are loose or broken. Make sure that both sides of any steps have handrails.  Any raised decks and porches should have guardrails on the edges.  Have any leaves, snow, or ice cleared regularly.  Use sand or salt on walking paths during winter.  Clean up any spills in your garage right away. This includes oil or grease spills. What can I do in the bathroom?  Use night lights.  Install grab bars by the toilet and in the tub and shower. Do not use towel bars as grab bars.  Use non-skid mats or decals in the tub or shower.  If you need to sit down in the shower, use a plastic, non-slip stool.  Keep the floor dry. Clean up any water that spills on the floor as soon as it happens.  Remove soap buildup in the tub or shower regularly.  Attach bath mats securely with double-sided non-slip rug tape.  Do not have throw rugs and other things on the floor that can make you trip. What can I do in the bedroom?  Use night lights.  Make sure that you have a light by your bed that is easy to reach.  Do not use any sheets or blankets that are too big for your bed. They should not hang down onto the floor.  Have a firm chair that has side arms. You can use this for support while you get dressed.  Do not have throw rugs and other things on the floor that can make you trip. What can I do in the kitchen?  Clean up any spills right away.  Avoid walking on wet floors.  Keep items that you use a lot in easy-to-reach places.  If you need to reach something above you, use a strong step stool that has a grab bar.  Keep electrical cords out of the way.  Do not use floor polish or wax that makes floors slippery. If you must use wax, use non-skid floor wax.  Do not have throw rugs and other things on the floor that can make you trip. What can I do with my stairs?  Do not leave any items on the  stairs.  Make sure that there are handrails on both sides of the stairs and use them. Fix handrails that are broken or loose. Make sure that handrails are as long as the stairways.  Check any carpeting to make sure that it is firmly attached to the stairs. Fix any carpet that is loose or worn.  Avoid having throw rugs at the top or bottom of the stairs. If you do have throw rugs, attach them to the floor with carpet tape.  Make sure that you have a light switch at the top of the stairs and the bottom of the stairs. If you do not have them, ask someone to add them for you. What else can I do to help prevent falls?  Wear shoes that:  Do not have high heels.  Have rubber bottoms.  Are comfortable and fit you well.  Are closed at the toe. Do not wear sandals.  If you use a stepladder:  Make sure that it is fully opened. Do not climb a closed stepladder.  Make sure that both sides of the stepladder are locked into place.  Ask someone to hold it for you, if possible.  Clearly mark and make sure that you can see:  Any grab bars or handrails.  First and last steps.  Where the edge of each step is.  Use tools that help you move around (mobility aids) if they are needed. These include:  Canes.  Walkers.  Scooters.  Crutches.  Turn on the lights when you go into a dark area. Replace any light bulbs as soon as they burn out.  Set up your furniture so you have a clear path. Avoid moving your furniture around.  If any of your floors are uneven, fix them.  If there are any pets around you, be aware of where they are.  Review your medicines with your doctor. Some medicines can make you feel dizzy. This can increase your chance of falling. Ask your doctor what other things that you can do to help prevent falls. This information is not intended to replace advice given to you by your health care provider. Make sure you discuss any questions you have with your health care  provider. Document Released: 12/12/2008 Document Revised: 07/24/2015 Document Reviewed: 03/22/2014 Elsevier Interactive Patient Education  2017 [employer].

## 2020-02-15 NOTE — Progress Notes (Signed)
Subjective:   Frank Minor. is a 79 y.o. male who presents for Medicare Annual/Subsequent preventive examination.  Review of Systems    No ROS. Medicare Wellness Visit. Additional risk factors are reflected in social history. Cardiac Risk Factors include: advanced age (>62men, >23 women);dyslipidemia;hypertension;male gender;obesity (BMI >30kg/m2)     Objective:    Today's Vitals   02/15/20 1441  BP: 120/70  Pulse: 84  Temp: 98.2 F (36.8 C)  SpO2: 97%  Weight: 185 lb 12.8 oz (84.3 kg)  Height: 5\' 4"  (1.626 m)  PainSc: 0-No pain   Body mass index is 31.89 kg/m.  Advanced Directives 02/15/2020 01/15/2019 10/18/2018 10/11/2018 08/14/2018 07/18/2018 07/18/2018  Does Patient Have a Medical Advance Directive? Yes Yes Yes Yes Yes Yes Yes  Type of Advance Directive - [place];Living will [place];Living will [place];Living will [place];Living will Healthcare Power of [place];Living will  Does patient want to make changes to medical advance directive? No - Patient declined - No - Patient declined No - Patient declined No - Patient declined No - Patient declined -  Copy of [place] in Chart? - No - copy requested No - copy requested No - copy requested No - copy requested No - copy requested No - copy requested  Would patient like information on creating a medical advance directive? - - - - - - -  Pre-existing out of facility DNR order (yellow form or pink MOST form) - - - - - - -    Current Medications (verified) Outpatient Encounter Medications as of 02/15/2020  Medication Sig  . apixaban (ELIQUIS) 5 MG TABS tablet Take 1 tablet (5 mg total) by mouth 2 (two) times daily.  . Calcium Carbonate (CALCIUM 500 PO) Take 500 mg by mouth 2 (two) times daily.  Marland Kitchen denosumab (PROLIA) 60 MG/ML SOSY injection Inject 60 mg into the skin every 6 (six) months.  .  esomeprazole (NEXIUM) 40 MG capsule Take 1 capsule (40 mg total) by mouth daily before breakfast.  . Multiple Vitamins-Minerals (CENTRUM MULTI + OMEGA 3 PO) Take 1 tablet by mouth daily.   . nabumetone (RELAFEN) 500 MG tablet Take 500 mg by mouth 2 (two) times daily as needed for moderate pain.  Marland Kitchen omega-3 acid ethyl esters (LOVAZA) 1 g capsule Take 2 capsules by mouth twice daily  . rosuvastatin (CRESTOR) 10 MG tablet Take 1 tablet by mouth once daily  . solifenacin (VESICARE) 10 MG tablet Take 10 mg by mouth every evening.  Marland Kitchen VITAMIN D PO Take 500 Units by mouth daily.  . [DISCONTINUED] omega-3 acid ethyl esters (LOVAZA) 1 g capsule Take 2 capsules by mouth twice daily  . [DISCONTINUED] omega-3 acid ethyl esters (LOVAZA) 1 g capsule Take 2 capsules by mouth twice daily  . [DISCONTINUED] rosuvastatin (CRESTOR) 10 MG tablet Take 1 tablet (10 mg total) by mouth daily.   No facility-administered encounter medications on file as of 02/15/2020.    Allergies (verified) Hydrocodone, Monosodium glutamate, Naproxen sodium, and Other   History: Past Medical History:  Diagnosis Date  . Diverticulosis   . Esophageal reflux   . Esophageal stricture   . HA (headache)   . Hearing loss   . Hiatal hernia   . Hypertriglyceridemia   . Hypoglycemia   . Obesity, unspecified   . PAF (paroxysmal atrial fibrillation) (HCC)    Echo with normal LV function and mild LVH  .  Presence of permanent cardiac pacemaker   . Renal mass    left  . Syncope    presented with atrial fib converted with Diltiazem  . Systemic inflammatory response syndrome ([place]) 06/26/2018   Past Surgical History:  Procedure Laterality Date  . CYSTOSCOPY WITH URETEROSCOPY AND STENT PLACEMENT Left 08/16/2018   Procedure: CYSTOSCOPY WITH LEFT RETROGRADE PYELOGRAM, BALLOON DILATION LEFT URETER, STENT PLACEMENT;  Surgeon: Lucas Mallow, MD;  Location: [facility];  Service: Urology;  Laterality: Left;  . HERNIA REPAIR    . INSERT /  REPLACE / REMOVE PACEMAKER    . LAPAROSCOPIC ASSISTED VENTRAL HERNIA REPAIR N/A 10/18/2018   Procedure: LAPAROSCOPIC ASSISTED VENTRAL WALL HERNIA REPAIR WITH MESH;  Surgeon: Michael Boston, MD;  Location: [facility];  Service: General;  Laterality: N/A;  . LAPAROSCOPIC NEPHRECTOMY, HAND ASSISTED Left 10/18/2018   Procedure: LEFT HAND ASSISTED LAPAROSCOPIC RADICAL NEPHRECTOMY WITH ADRENALECTOMY;  Surgeon: Lucas Mallow, MD;  Location: [facility];  Service: Urology;  Laterality: Left;  . LOOP RECORDER INSERTION N/A 04/26/2018   Procedure: LOOP RECORDER INSERTION;  Surgeon: Thompson Grayer, MD;  Location: [place] [facility];  Service: Cardiovascular;  Laterality: N/A;  . LOOP RECORDER REMOVAL N/A 07/18/2018   Procedure: LOOP RECORDER REMOVAL;  Surgeon: Thompson Grayer, MD;  Location: [place] [facility];  Service: Cardiovascular;  Laterality: N/A;  . NASAL SEPTUM SURGERY    . PACEMAKER IMPLANT N/A 07/18/2018   Procedure: PACEMAKER IMPLANT;  Surgeon: Thompson Grayer, MD;  Location: [place] [facility];  Service: Cardiovascular;  Laterality: N/A;  . TONSILLECTOMY    . TONSILLECTOMY    . UMBILICAL HERNIA REPAIR  1993  . WISDOM TOOTH EXTRACTION     about 79 years old   Family History  Problem Relation Age of Onset  . Emphysema Father   . Gallbladder disease Sister   . Bipolar disorder Sister   . Bipolar disorder Paternal Grandmother   . Colon cancer Neg Hx    Social History   Socioeconomic History  . Marital status: Married    Spouse name: Not on file  . Number of children: 1  . Years of education: Not on file  . Highest education level: Not on file  Occupational History  . 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 Smoking status: Former Smoker    Quit date: 03/02/1987    Years since quitting: 32.9  . Smokeless tobacco: Never Used  Vaping Use  . Vaping Use: Never used  Substance and Sexual Activity  . Alcohol use: No    Alcohol/week: 0.0 standard drinks  . Drug  use: No  . Sexual activity: Not Currently  Other Topics Concern  . Not on file  Social History Narrative  . Not on file   Social Determinants of Health   Financial Resource Strain: Low Risk   . Difficulty of Paying Living Expenses: Not hard at all  Food Insecurity: No Food Insecurity  . Worried About Charity fundraiser in the Last Year: Never true  . Ran Out of Food in the Last Year: Never true  Transportation Needs: No Transportation Needs  . Lack of Transportation (Medical): No  . Lack of Transportation (Non-Medical): No  Physical Activity: Sufficiently Active  . Days of Exercise per Week: 7 days  . Minutes of Exercise per Session: 60 min  Stress: No Stress Concern Present  . Feeling of Stress : Not at all  Social Connections: Socially Integrated  .  Frequency of Communication with Friends and Family: More than three times a week  . Frequency of Social Gatherings with Friends and Family: More than three times a week  . Attends Religious Services: More than 4 times per year  . Active Member of Clubs or Organizations: Yes  . Attends Archivist Meetings: More than 4 times per year  . Marital Status: Married    Tobacco Counseling Counseling given: Not Answered   Clinical Intake:  Pre-visit preparation completed: Yes  Pain : No/denies pain Pain Score: 0-No pain     BMI - recorded: 31.89 Nutritional Status: BMI > 30  Obese Nutritional Risks: None Diabetes: No  How often do you need to have someone help you when you read instructions, pamphlets, or other written materials from your doctor or pharmacy?: 1 - Never What is the last grade level you completed in school?: Criminal Justice  Diabetic? no  Interpreter Needed?: No  Information entered by :: Lisette Abu, LPN   Activities of Daily Living In your present state of health, do you have any difficulty performing the following activities: 02/15/2020 01/17/2020  Hearing? N Y  Vision? N N   Difficulty concentrating or making decisions? N N  Walking or climbing stairs? N N  Dressing or bathing? N N  Doing errands, shopping? N N  Preparing Food and eating ? N -  Using the Toilet? N -  In the past six months, have you accidently leaked urine? N -  Do you have problems with loss of bowel control? N -  Managing your Medications? N -  Managing your Finances? N -  Housekeeping or managing your Housekeeping? N -  Some recent data might be hidden    Patient Care Team: [name], MD as PCP - General Nahser, Wonda Cheng, MD as PCP - Cardiology (Cardiology) Irene Shipper, MD as Consulting Physician (Gastroenterology) Michael Boston, MD as Consulting Physician (General Surgery) Thompson Grayer, MD as Consulting Physician (Cardiology) Lucas Mallow, MD as Consulting Physician (Urology) Wyatt Portela, MD as Consulting Physician (Oncology) Charlton Haws, [facility] as Pharmacist (Pharmacist)  Indicate any recent Medical Services you may have received from other than [facility] providers in the past year (date may be approximate).     Assessment:   This is a routine wellness examination for [facility].  Hearing/Vision screen No exam data present  Dietary issues and exercise activities discussed: Exercise limited by: cardiac condition(s)  Goals    . Patient Stated     Stay as healthy and as independent as possible.      Depression Screen PHQ 2/9 Scores 02/15/2020 01/17/2020 01/15/2019 01/15/2019 01/15/2018 01/11/2018 01/10/2017  PHQ - 2 Score 0 0 2 2 0 0 0  PHQ- 9 Score - - 8 - - 0 -    Fall Risk Fall Risk  02/15/2020 01/17/2020 01/15/2019 01/15/2018 01/11/2018  Falls in the past year? 0 0 0 0 0  Comment - - - - -  Number falls in past yr: 0 - 0 - -  Injury with Fall? 0 - 0 - -  Risk for fall due to : No Fall Risks - - - -  Follow up Falls evaluation completed - - - -    FALL RISK PREVENTION PERTAINING TO THE HOME:  Any stairs in or around the home? Yes  If so,  are there any without handrails? No  Home free of loose throw rugs in walkways, pet beds, electrical cords, etc? Yes  Adequate lighting in your home to reduce risk of falls? Yes   ASSISTIVE DEVICES UTILIZED TO PREVENT FALLS:  Life alert? Yes  Use of a cane, walker or w/c? No  Grab bars in the bathroom? No  Shower chair or bench in shower? No  Elevated toilet seat or a handicapped toilet? No   TIMED UP AND GO:  Was the test performed? No .  Length of time to ambulate 10 feet: 0 sec.   Gait steady and fast without use of assistive device  Cognitive Function: Normal cognitive status assessed by direct observation by this Nurse Health Advisor. No abnormalities found.   MMSE - Mini Mental State Exam 01/11/2018  Not completed: Refused        Immunizations Immunization History  Administered Date(s) Administered  . Fluad Quad(high Dose 65+) 12/06/2018, 01/17/2020  . Influenza Whole 01/19/2010  . Influenza, High Dose Seasonal PF 01/06/2016, 01/10/2017, 01/11/2018  . Influenza,inj,Quad PF,6+ Mos 11/28/2012, 12/03/2013, 12/25/2014  . PFIZER SARS-COV-2 Vaccination 04/15/2019, 05/08/2019, 02/01/2020  . Pneumococcal Conjugate-13 11/28/2012  . Pneumococcal Polysaccharide-23 12/03/2013, 01/17/2020  . Td 03/01/2006  . Tdap 04/07/2016  . Zoster 03/30/2013  . Zoster Recombinat (Shingrix) 10/18/2017, 05/09/2018    TDAP status: Up to date  Flu Vaccine status: Up to date  Pneumococcal vaccine status: Up to date  Covid-19 vaccine status: Completed vaccines  Qualifies for Shingles Vaccine? Yes   Zostavax completed Yes   Shingrix Completed?: Yes  Screening Tests Health Maintenance  Topic Date Due  . Hepatitis C Screening  Never done  . COVID-19 Vaccine (4 - Booster for Pfizer series) 08/01/2020  . COLONOSCOPY  08/09/2021  . TETANUS/TDAP  04/07/2026  . INFLUENZA VACCINE  Completed  . PNA vac Low Risk Adult  Completed    Health Maintenance  Health Maintenance Due  Topic Date  Due  . Hepatitis C Screening  Never done    Colorectal cancer screening: Type of screening: Colonoscopy. Completed 08/09/2016. Repeat every 5 years  Lung Cancer Screening: (Low Dose CT Chest recommended if Age 26-80 years, 30 pack-year currently smoking OR have quit w/in 15years.) does qualify.   Lung Cancer Screening Referral: no; done once a year  Additional Screening:  Hepatitis C Screening: does not qualify; Completed no  Vision Screening: Recommended annual ophthalmology exams for early detection of glaucoma and other disorders of the eye. Is the patient up to date with their annual eye exam?  Yes  Who is the provider or what is the name of the office in which the patient attends annual eye exams? [facility]  If pt is not established with a provider, would they like to be referred to a provider to establish care? No .   Dental Screening: Recommended annual dental exams for proper oral hygiene  Community Resource Referral / Chronic Care Management: CRR required this visit?  No   CCM required this visit?  No      Plan:     I have personally reviewed and noted the following in the patient's chart:   . Medical and social history . Use of alcohol, tobacco or illicit drugs  . Current medications and supplements . Functional ability and status . Nutritional status . Physical activity . Advanced directives . List of other physicians . Hospitalizations, surgeries, and ER visits in previous 12 months . Vitals . Screenings to include cognitive, depression, and falls . Referrals and appointments  In addition, I have reviewed and discussed with patient certain preventive protocols, quality metrics, and best  practice recommendations. A written personalized care plan for preventive services as well as general preventive health recommendations were provided to patient.     Sheral Flow, LPN   [id number]   Nurse Notes: n/a

## 2020-03-07 ENCOUNTER — Telehealth: Payer: Self-pay | Admitting: Pharmacist

## 2020-03-07 ENCOUNTER — Inpatient Hospital Stay: Payer: Medicare Other | Attending: Oncology | Admitting: Oncology

## 2020-03-07 ENCOUNTER — Other Ambulatory Visit: Payer: Self-pay

## 2020-03-07 VITALS — BP 109/81 | HR 60 | Temp 97.7°F | Resp 17 | Ht 64.0 in | Wt 186.1 lb

## 2020-03-07 DIAGNOSIS — Z85528 Personal history of other malignant neoplasm of kidney: Secondary | ICD-10-CM | POA: Insufficient documentation

## 2020-03-07 DIAGNOSIS — C642 Malignant neoplasm of left kidney, except renal pelvis: Secondary | ICD-10-CM | POA: Diagnosis not present

## 2020-03-07 DIAGNOSIS — Z79899 Other long term (current) drug therapy: Secondary | ICD-10-CM | POA: Diagnosis not present

## 2020-03-07 DIAGNOSIS — K863 Pseudocyst of pancreas: Secondary | ICD-10-CM | POA: Diagnosis not present

## 2020-03-07 DIAGNOSIS — Z7901 Long term (current) use of anticoagulants: Secondary | ICD-10-CM | POA: Diagnosis not present

## 2020-03-07 DIAGNOSIS — Z905 Acquired absence of kidney: Secondary | ICD-10-CM | POA: Diagnosis not present

## 2020-03-07 DIAGNOSIS — R918 Other nonspecific abnormal finding of lung field: Secondary | ICD-10-CM | POA: Insufficient documentation

## 2020-03-07 NOTE — Progress Notes (Signed)
Chronic Care Management Pharmacy Assistant   Name: [name]  MRN: [id number] DOB: 1940/08/16  Reason for Encounter: Initial Questions   PCP : [name], MD  Allergies:   Allergies  Allergen Reactions   Hydrocodone Other (See Comments)    Caused confusion    Monosodium Glutamate Other (See Comments)    Caused headaches   Naproxen Sodium     Mouth blisters with prolonged use.  Left nephrectomy 2020   Other     Perfume - headaches     Medications: Outpatient Encounter Medications as of 03/07/2020  Medication Sig   apixaban (ELIQUIS) 5 MG TABS tablet Take 1 tablet (5 mg total) by mouth 2 (two) times daily.   Calcium Carbonate (CALCIUM 500 PO) Take 500 mg by mouth 2 (two) times daily.   denosumab (PROLIA) 60 MG/ML SOSY injection Inject 60 mg into the skin every 6 (six) months.   esomeprazole (NEXIUM) 40 MG capsule Take 1 capsule (40 mg total) by mouth daily before breakfast.   Multiple Vitamins-Minerals (CENTRUM MULTI + OMEGA 3 PO) Take 1 tablet by mouth daily.    nabumetone (RELAFEN) 500 MG tablet Take 500 mg by mouth 2 (two) times daily as needed for moderate pain.   omega-3 acid ethyl esters (LOVAZA) 1 g capsule Take 2 capsules by mouth twice daily   rosuvastatin (CRESTOR) 10 MG tablet Take 1 tablet by mouth once daily   solifenacin (VESICARE) 10 MG tablet Take 10 mg by mouth every evening.   VITAMIN D PO Take 500 Units by mouth daily.   No facility-administered encounter medications on file as of 03/07/2020.    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[place]) 01/15/2019   Compression fracture of thoracic vertebra ([place]) 12/26/2018   Compression fracture of lumbar spine, non-traumatic ([place]) 12/26/2018   Iron deficiency anemia due to dietary causes 12/07/2018    Age-related osteoporosis with current pathological fracture 12/07/2018   Prediabetes 12/06/2018   Localized osteoporosis with current pathological fracture 12/06/2018   Chronic anticoagulation 08/22/2018   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 08/22/2018   Recurrent ventral incisional hernia 08/22/2018   Sick sinus syndrome ([place]) 07/18/2018   Intractable low back pain 06/23/2018   Lung nodule seen on imaging study 06/23/2018   Abdominal aortic aneurysm (AAA) without rupture ([place]) 06/23/2018   Cervical radiculopathy 06/16/2018   Essential hypertension 05/01/2018   OAB (overactive bladder) 01/06/2016   Erectile dysfunction due to arterial insufficiency 04/23/2015   Hyperlipidemia with target LDL less than 70 11/28/2012   Routine general medical examination at a health care facility 11/28/2012   PAF (paroxysmal atrial fibrillation) ([place]) 04/16/2011   GERD (gastroesophageal reflux disease) 11/10/2010   BPH (benign prostatic hyperplasia) 01/19/2010   HYPERTRIGLYCERIDEMIA 11/22/2007    Goals Addressed   None     Follow-Up:  Pharmacist Review   Have you seen any other providers since your last visit? The patient states he has seen his Kidney doctors and going to see his cardiologist today  Any changes in your medications or health? The patient states there has been no changes in his health or medications  Any side effects from any medications? The patient states that he does not have any side effects from medications  Do you have an symptoms or  problems not managed by your medications? The patent states that he does not have any symptoms or problems not managed by medications   Any concerns about your health right now? The patient states that he does not have any issues or concerns about his health  Has your provider asked that you check blood pressure, blood sugar, or follow special diet at home? The patient states that he has not  been asked to check his blood pressure, sugar or follow a special diet  Do you get any type of exercise on a regular basis? The patient states that he is very active with walking   Can you think of a goal you would like to reach for your health? The patient states his goal is to live and see another year being healthy  Do you have any problems getting your medications? The patient states that he does not have any questions with getting his medications from the pharmacy  Is there anything that you would like to discuss during the appointment? The patient states that he would like to discuss his prolio shot for bone density and also he states that he had some xray's done on his back to show fractures and was not able to see them. He would like to discuss seeing those xrays  Please bring medications and supplements to appointment   Wendy Poet, [place] [phone]

## 2020-03-07 NOTE — Progress Notes (Signed)
Hematology and Oncology Follow Up   Hernandez Losasso [id number] Jan 21, 1941 80 y.o. 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  He presented with adrenal gland involvement and received surgical resection at that time.   Prior Therapy:  He is status post left radical nephrectomy completed by Dr. Gloriann Loan on October 18, 2018.  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  Current therapy: Active surveillance.  Interim History: Mr. Neas is here for a follow-up visit.  Since the last visit, he reports no major changes in his health.  He denies any abdominal pain or early satiety.  He denies any nausea vomiting or recent hospitalizations.  Denies any illnesses or weight loss.  He denies any bleeding complications.    Medications: Updated on review. Current Outpatient Medications  Medication Sig Dispense Refill  . apixaban (ELIQUIS) 5 MG TABS tablet Take 1 tablet (5 mg total) by mouth 2 (two) times daily. 180 tablet 3  . Calcium Carbonate (CALCIUM 500 PO) Take 500 mg by mouth 2 (two) times daily.    Marland Kitchen denosumab (PROLIA) 60 MG/ML SOSY injection Inject 60 mg into the skin every 6 (six) months.    . esomeprazole (NEXIUM) 40 MG capsule Take 1 capsule (40 mg total) by mouth daily before breakfast. 90 capsule 2  . Multiple Vitamins-Minerals (CENTRUM MULTI + OMEGA 3 PO) Take 1 tablet by mouth daily.     . nabumetone (RELAFEN) 500 MG tablet Take 500 mg by mouth 2 (two) times daily as needed for moderate pain.    Marland Kitchen omega-3 acid ethyl esters (LOVAZA) 1 g capsule Take 2 capsules by mouth twice daily 360 capsule 1  . rosuvastatin (CRESTOR) 10 MG tablet Take 1 tablet by mouth once daily 90 tablet 1  . solifenacin (VESICARE) 10 MG tablet Take 10 mg by mouth every evening.    Marland Kitchen VITAMIN D PO Take 500 Units by mouth daily.     No current  facility-administered medications for this visit.     Allergies:  Allergies  Allergen Reactions  . Hydrocodone Other (See Comments)    Caused confusion   . Monosodium Glutamate Other (See Comments)    Caused headaches  . Naproxen Sodium     Mouth blisters with prolonged use.  Left nephrectomy 2020  . Other     Perfume - headaches    Physical exam:  Blood pressure 109/81, pulse 60, temperature 97.7 F (36.5 C), temperature source Tympanic, resp. rate 17, height 5\' 4"  (1.626 m), weight 186 lb 1.6 oz (84.4 kg), SpO2 100 %.    ECOG 1     General appearance: Comfortable appearing without any discomfort Head: Normocephalic without any trauma Oropharynx: Mucous membranes are moist and pink without any thrush or ulcers. Eyes: Pupils are equal and round reactive to light. Lymph nodes: No cervical, supraclavicular, inguinal or axillary lymphadenopathy.   Heart:regular rate and rhythm.  S1 and S2 without leg edema. Lung: Clear without any rhonchi or wheezes.  No dullness to percussion. Abdomin: Soft, nontender, nondistended with good bowel sounds.  No hepatosplenomegaly. Musculoskeletal: No joint deformity or effusion.  Full range of motion noted. Neurological: No deficits noted on motor, sensory and deep tendon reflex exam. Skin: No petechial rash or dryness.  Appeared moist.  Psychiatric: Mood and affect appeared appropriate.        Lab Results: Lab  Results  Component Value Date   WBC 7.3 01/17/2020   HGB 13.9 01/17/2020   HCT 40.7 01/17/2020   MCV 97.8 01/17/2020   PLT 120.0 (L) 01/17/2020     Chemistry      Component Value Date/Time   NA 138 01/17/2020 1403   NA 140 10/12/2017 0729   K 3.9 01/17/2020 1403   K 3.8 11/09/2013 0000   CL 104 01/17/2020 1403   CL 105 11/09/2013 0000   CO2 27 01/17/2020 1403   CO2 26 11/09/2013 0000   BUN 20 01/17/2020 1403   BUN 13 10/12/2017 0729   CREATININE 1.35 01/17/2020 1403   CREATININE 0.66 11/09/2013 0000       Component Value Date/Time   CALCIUM 9.1 01/17/2020 1403   CALCIUM 8.7 11/09/2013 0000   ALKPHOS 34 (L) 01/17/2020 1403   ALKPHOS 55 11/09/2013 0000   AST 24 01/17/2020 1403   AST 18 11/09/2013 0000   ALT 26 01/17/2020 1403   BILITOT 0.5 01/17/2020 1403   BILITOT 0.5 10/12/2017 0729   BILITOT 0.6 11/09/2013 0000     IMPRESSION: Chest Impression:  1. Small pulmonary nodule along the RIGHT major fissure is not found on comparison exams. Recommend close attention on follow-up. 2. No mediastinal lymphadenopathy.  Abdomen / Pelvis Impression:  1. No evidence of local recurrence in the LEFT nephrectomy bed. 2. Continued interval decrease in size of ovoid cystic lesion at the tail the pancreas. 3. No metastatic disease in the abdomen pelvis. 4. No skeletal metastasis.   Electronically Signed By: Suzy Bouchard M.D.    Impression and Plan:  80 year old with:  1.    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    The natural course of this disease was reviewed at this time and treatment options were discussed.  He continues to be on active surveillance without any evidence of disease relapse.  CT scan obtained in November 2021 showed no evidence of relapsed disease at this time.  Risks and benefits of continued active surveillance versus additional systemic therapy were discussed.  At this time I recommended continued surveillance and only use systemic therapy if he has measurable disease.  2.  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   3.  Follow-up: In 6 months for a follow-up visit.    30  minutes were spent on this visit.  The time was dedicated to reviewing disease status, discussing treatment options and future plan of care review.   Zola Button, MD 03/07/2020 1:15 PM

## 2020-03-10 ENCOUNTER — Ambulatory Visit: Payer: Medicare Other | Admitting: Pharmacist

## 2020-03-10 ENCOUNTER — Other Ambulatory Visit: Payer: Self-pay

## 2020-03-10 DIAGNOSIS — I480 Paroxysmal atrial fibrillation: Secondary | ICD-10-CM

## 2020-03-10 DIAGNOSIS — M8080XD Other osteoporosis with current pathological fracture, unspecified site, subsequent encounter for fracture with routine healing: Secondary | ICD-10-CM

## 2020-03-10 DIAGNOSIS — I10 Essential (primary) hypertension: Secondary | ICD-10-CM

## 2020-03-10 DIAGNOSIS — E785 Hyperlipidemia, unspecified: Secondary | ICD-10-CM

## 2020-03-10 NOTE — Patient Instructions (Addendum)
Visit Information  Phone number for Pharmacist: [phone]  Thank you for meeting with me to discuss your medications! I look forward to working with you to achieve your health care goals. Below is a summary of what we talked about during the visit:  Goals Addressed            This Visit's Progress   . Pharmacy Care Plan       CARE PLAN ENTRY (see longitudinal plan of care for additional care plan information)  Current Barriers:  . Chronic Disease Management support, education, and care coordination needs related to Hypertension, Hyperlipidemia, Atrial Fibrillation, and Osteoporosis   Atrial Fibrillation / Hypertension BP Readings from Last 3 Encounters:  03/07/20 109/81  02/15/20 120/70  01/18/20 118/68   Pulse Readings from Last 3 Encounters:  03/07/20 60  02/15/20 84  01/18/20 60 .  Pharmacist Clinical Goal(s): o Over the next 365 days, patient will work with PharmD and providers to maintain BP goal <130/80 and HR goal < 90 . Current regimen:  o Eliquis 5 mg twice a day . Interventions: o Discussed BP and HR goals and benefits of medications for prevention of heart attack / stroke o Discussed importance of adherence with Eliquis to prevent stroke; discussed bleeding risk with NSAIDs . Patient self care activities - Over the next 365 days, patient will: o Check BP as needed, document, and provide at future appointments o Use NSAIDs sparingly and monitor for bleeding  Hyperlipidemia Lab Results  Component Value Date/Time   LDLCALC 113 (H) 01/15/2019 09:26 AM   LDLCALC 95 10/12/2017 07:29 AM   LDLDIRECT 59.0 01/17/2020 02:03 PM .  Pharmacist Clinical Goal(s): o Over the next 365 days, patient will work with PharmD and providers to maintain LDL goal < 100 . Current regimen:  o Rosuvastatin 10 mg daily o Lovaza 1 gm - 2 capsules twice a day . Interventions: o Discussed cholesterol goals and benefits of medications for prevention of heart attack / stroke . Patient  self care activities - Over the next 365 days, patient will: o Continue current medications  Osteoporisis . Pharmacist Clinical Goal(s) o Over the next 365 days, patient will work with PharmD and providers to optimize therapy . Current regimen:  o Prolia 60 mg q6 months (last given 10/03/2019) o Calcium carbonate 500 mg twice a day o Vitamin D 500 IU daily . Interventions: o Discussed benefits of Prolia for bone health; pt is due for next injection after 04/04/2020 . Patient self care activities - Over the next 365 days, patient will: o Call office to schedule Prolia injections every 6 months  Medication management . Pharmacist Clinical Goal(s): o Over the next 365 days, patient will work with PharmD and providers to maintain optimal medication adherence . Current pharmacy: [facility] . Interventions o Comprehensive medication review performed. o Continue current medication management strategy . Patient self care activities - Over the next 365 days, patient will: o Focus on medication adherence by pill box o Take medications as prescribed o Report any questions or concerns to PharmD and/or provider(s)  Initial goal documentation      Mr. Smolen was given information about Chronic Care Management services today including:  1. CCM service includes personalized support from designated clinical staff supervised by his physician, including individualized plan of care and coordination with other care providers 2. 24/7 contact phone numbers for assistance for urgent and routine care needs. 3. Standard insurance, coinsurance, copays and deductibles apply for chronic care management only  during months in which we provide at least 20 minutes of these services. Most insurances cover these services at 100%, however patients may be responsible for any copay, coinsurance and/or deductible if applicable. This service may help you avoid the need for more expensive face-to-face services. 4. Only one  practitioner may furnish and bill the service in a calendar month. 5. The patient may stop CCM services at any time (effective at the end of the month) by phone call to the office staff.  Patient agreed to services and verbal consent obtained.   The patient verbalized understanding of instructions, educational materials, and care plan provided today and declined offer to receive copy of patient instructions, educational materials, and care plan.  Telephone follow up appointment with pharmacy team member scheduled for: 1 year  Charlene Brooke, PharmD, BCACP Clinical Pharmacist [facility] After Age 79 After age 27, you are at a higher risk for certain long-term diseases and infections as well as injuries from falls. Falls are a major cause of broken bones and head injuries in people who are older than age 79. Getting regular preventive care can help to keep you healthy and well. Preventive care includes getting regular testing and making lifestyle changes as recommended by your health care provider. Talk with your health care provider about:  Which screenings and tests you should have. A screening is a test that checks for a disease when you have no symptoms.  A diet and exercise plan that is right for you. What should I know about screenings and tests to prevent falls? Screening and testing are the best ways to find a health problem early. Early diagnosis and treatment give you the best chance of managing medical conditions that are common after age 79. Certain conditions and lifestyle choices may make you more likely to have a fall. Your health care provider may recommend:  Regular vision checks. Poor vision and conditions such as cataracts can make you more likely to have a fall. If you wear glasses, make sure to get your prescription updated if your vision changes.  Medicine review. Work with your health care provider to regularly  review all of the medicines you are taking, including over-the-counter medicines. Ask your health care provider about any side effects that may make you more likely to have a fall. Tell your health care provider if any medicines that you take make you feel dizzy or sleepy.  Osteoporosis screening. Osteoporosis is a condition that causes the bones to get weaker. This can make the bones weak and cause them to break more easily.  Blood pressure screening. Blood pressure changes and medicines to control blood pressure can make you feel dizzy.  Strength and balance checks. Your health care provider may recommend certain tests to check your strength and balance while standing, walking, or changing positions.  Foot health exam. Foot pain and numbness, as well as not wearing proper footwear, can make you more likely to have a fall.  Depression screening. You may be more likely to have a fall if you have a fear of falling, feel emotionally low, or feel unable to do activities that you used to do.  Alcohol use screening. Using too much alcohol can affect your balance and may make you more likely to have a fall. What actions can I take to lower my risk of falls? General instructions  Talk with your health care provider about your risks for falling. Tell your health  care provider if: ? You fall. Be sure to tell your health care provider about all falls, even ones that seem minor. ? You feel dizzy, sleepy, or off-balance.  Take over-the-counter and prescription medicines only as told by your health care provider. These include any supplements.  Eat a healthy diet and maintain a healthy weight. A healthy diet includes low-fat dairy products, low-fat (lean) meats, and fiber from whole grains, beans, and lots of fruits and vegetables. Home safety  Remove any tripping hazards, such as rugs, cords, and clutter.  Install safety equipment such as grab bars in bathrooms and safety rails on stairs.  Keep  rooms and walkways well-lit. Activity  Follow a regular exercise program to stay fit. This will help you maintain your balance. Ask your health care provider what types of exercise are appropriate for you.  If you need a cane or walker, use it as recommended by your health care provider.  Wear supportive shoes that have nonskid soles.   Lifestyle  Do not drink alcohol if your health care provider tells you not to drink.  If you drink alcohol, limit how much you have: ? 0-1 drink a day for women. ? 0-2 drinks a day for men.  Be aware of how much alcohol is in your drink. In the [place] one drink equals one typical bottle of beer (12 oz), one-half glass of wine (5 oz), or one shot of hard liquor (1 oz).  Do not use any products that contain nicotine or tobacco, such as cigarettes and e-cigarettes. If you need help quitting, ask your health care provider. Summary  Having a healthy lifestyle and getting preventive care can help to protect your health and wellness after age 65.  Screening and testing are the best way to find a health problem early and help you avoid having a fall. Early diagnosis and treatment give you the best chance for managing medical conditions that are more common for people who are older than age 45.  Falls are a major cause of broken bones and head injuries in people who are older than age 76. Take precautions to prevent a fall at home.  Work with your health care provider to learn what changes you can make to improve your health and wellness and to prevent falls. This information is not intended to replace advice given to you by your health care provider. Make sure you discuss any questions you have with your health care provider. Document Revised: 06/08/2018 Document Reviewed: 12/29/2016 Elsevier Patient Education  2021 [employer].

## 2020-03-10 NOTE — Chronic Care Management (AMB) (Signed)
Chronic Care Management Pharmacy  Name: Newt Levingston.  MRN: [id number] DOB: 12-12-1940   Chief Complaint/ HPI  [name],  80 y.o. , male presents for his Initial CCM visit with the clinical pharmacist via telephone due to COVID-19 Pandemic.  PCP : [name], MD Patient Care Team: [name], MD as PCP - General Nahser, Wonda Cheng, MD as PCP - Cardiology (Cardiology) Irene Shipper, MD as Consulting Physician (Gastroenterology) Michael Boston, MD as Consulting Physician (General Surgery) [place] Grayer, MD as Consulting Physician (Cardiology) Lucas Mallow, MD as Consulting Physician (Urology) Wyatt Portela, MD as Consulting Physician (Oncology) Charlton Haws, [facility] as Pharmacist (Pharmacist)  Patient's chronic conditions include: Hypertension, Hyperlipidemia, Atrial Fibrillation, GERD, Chronic Kidney Disease, Osteoporosis, Overactive Bladder and BPH, sick sinus syndrome s/p pacemaker, hx Renal cell carcinomia  Office Visits: 01/17/20 Dr [name] OV: chronic f/u, labs stable, X-rays show improvement in compression fracture. No med changes. Given PPSV23  Consult Visit: 03/07/20 Dr [name] (oncology): f/u kidney cancer, active surveillance w/o evidence of relapse.  10/17/19 Dr Rayann Heman (cardiology/EP): f/u sick sinus syndrome  Subjective: Patient lives with wife [name].   Objective: Allergies  Allergen Reactions  . Hydrocodone Other (See Comments)    Caused confusion   . Monosodium Glutamate Other (See Comments)    Caused headaches  . Naproxen Sodium     Mouth blisters with prolonged use.  Left nephrectomy 2020  . Other     Perfume - headaches    Medications: Outpatient Encounter Medications as of 03/10/2020  Medication Sig  . apixaban (ELIQUIS) 5 MG TABS tablet Take 1 tablet (5 mg total) by mouth 2 (two) times daily.  . Calcium Carbonate (CALCIUM 500 PO) Take 500 mg by mouth 2 (two) times daily.  Marland Kitchen denosumab (PROLIA) 60 MG/ML SOSY injection  Inject 60 mg into the skin every 6 (six) months.  . esomeprazole (NEXIUM) 40 MG capsule Take 1 capsule (40 mg total) by mouth daily before breakfast.  . Multiple Vitamins-Minerals (CENTRUM MULTI + OMEGA 3 PO) Take 1 tablet by mouth daily.   . nabumetone (RELAFEN) 500 MG tablet Take 500 mg by mouth 2 (two) times daily as needed for moderate pain.  . naproxen sodium (ALEVE) 220 MG tablet Take 220 mg by mouth.  . omega-3 acid ethyl esters (LOVAZA) 1 g capsule Take 2 capsules by mouth twice daily  . rosuvastatin (CRESTOR) 10 MG tablet Take 1 tablet by mouth once daily  . solifenacin (VESICARE) 10 MG tablet Take 10 mg by mouth every evening.  . tadalafil (CIALIS) 5 MG tablet Take 5 mg by mouth daily as needed for erectile dysfunction.  Marland Kitchen VITAMIN D PO Take 1,000 Units by mouth daily.   No facility-administered encounter medications on file as of 03/10/2020.    Wt Readings from Last 3 Encounters:  03/07/20 186 lb 1.6 oz (84.4 kg)  02/15/20 185 lb 12.8 oz (84.3 kg)  01/18/20 185 lb (83.9 kg)    Lab Results  Component Value Date   CREATININE 1.35 01/17/2020   BUN 20 01/17/2020   GFR 50.48 (L) 01/17/2020   GFRNONAA >60 10/20/2018   GFRAA >60 10/20/2018   NA 138 01/17/2020   K 3.9 01/17/2020   CALCIUM 9.1 01/17/2020   CO2 27 01/17/2020     Current Diagnosis/Assessment:    Goals Addressed            This Visit's Progress   . Pharmacy Care Plan  CARE PLAN ENTRY (see longitudinal plan of care for additional care plan information)  Current Barriers:  . Chronic Disease Management support, education, and care coordination needs related to Hypertension, Hyperlipidemia, Atrial Fibrillation, and Osteoporosis   Atrial Fibrillation / Hypertension BP Readings from Last 3 Encounters:  03/07/20 109/81  02/15/20 120/70  01/18/20 118/68   Pulse Readings from Last 3 Encounters:  03/07/20 60  02/15/20 84  01/18/20 60 .  Pharmacist Clinical Goal(s): o Over the next 365 days,  patient will work with PharmD and providers to maintain BP goal <130/80 and HR goal < 90 . Current regimen:  o Eliquis 5 mg twice a day . Interventions: o Discussed BP and HR goals and benefits of medications for prevention of heart attack / stroke o Discussed importance of adherence with Eliquis to prevent stroke; discussed bleeding risk with NSAIDs . Patient self care activities - Over the next 365 days, patient will: o Check BP as needed, document, and provide at future appointments o Use NSAIDs sparingly and monitor for bleeding  Hyperlipidemia Lab Results  Component Value Date/Time   LDLCALC 113 (H) 01/15/2019 09:26 AM   LDLCALC 95 10/12/2017 07:29 AM   LDLDIRECT 59.0 01/17/2020 02:03 PM .  Pharmacist Clinical Goal(s): o Over the next 365 days, patient will work with PharmD and providers to maintain LDL goal < 100 . Current regimen:  o Rosuvastatin 10 mg daily o Lovaza 1 gm - 2 capsules twice a day . Interventions: o Discussed cholesterol goals and benefits of medications for prevention of heart attack / stroke . Patient self care activities - Over the next 365 days, patient will: o Continue current medications  Osteoporisis . Pharmacist Clinical Goal(s) o Over the next 365 days, patient will work with PharmD and providers to optimize therapy . Current regimen:  o Prolia 60 mg q6 months (last given 10/03/2019) o Calcium carbonate 500 mg twice a day o Vitamin D 500 IU daily . Interventions: o Discussed benefits of Prolia for bone health; pt is due for next injection after 04/04/2020 . Patient self care activities - Over the next 365 days, patient will: o Call office to schedule Prolia injections every 6 months  Medication management . Pharmacist Clinical Goal(s): o Over the next 365 days, patient will work with PharmD and providers to maintain optimal medication adherence . Current pharmacy: [facility] . Interventions o Comprehensive medication review performed. o Continue  current medication management strategy . Patient self care activities - Over the next 365 days, patient will: o Focus on medication adherence by pill box o Take medications as prescribed o Report any questions or concerns to PharmD and/or provider(s)  Initial goal documentation      AFIB / Hypertension   Patient is currently rate controlled. Office heart rates are  Pulse Readings from Last 3 Encounters:  03/07/20 60  02/15/20 84  01/18/20 60   CHA2DS2-VASc Score = 3  The patient's score is based upon: CHF History: No HTN History: Yes Diabetes History: No Stroke History: No Vascular Disease History: No Age Score: 2 Gender Score: 0  BP goal is:  <130/80  Office blood pressures are  BP Readings from Last 3 Encounters:  03/07/20 109/81  02/15/20 120/70  01/18/20 118/68   Patient checks BP at home infrequently Patient home BP readings are ranging: n/a  Patient has failed these meds in past: n/a Patient is currently controlled on the following medications:  . Eliquis 5 mg BID  We discussed:  Pt reports  he spontaneously goes in and out of Afib a few times a year; he never misses a dose of Eliquis; discussed bleeding risk with Eliquis and NSAIDs, advised to use NSAIDS as sparingly as possible  Plan  Continue current medications    Hyperlipidemia   LDL goal < 100  Last lipids Lab Results  Component Value Date   CHOL 118 01/17/2020   HDL 29.50 (L) 01/17/2020   LDLCALC 113 (H) 01/15/2019   LDLDIRECT 59.0 01/17/2020   TRIG 220.0 (H) 01/17/2020   CHOLHDL 4 01/17/2020   Hepatic Function Latest Ref Rng & Units 01/17/2020 06/27/2018 06/26/2018  Total Protein 6.0 - 8.3 g/dL 6.8 5.4(L) 6.1(L)  Albumin 3.5 - 5.2 g/dL 4.3 2.7(L) 3.1(L)  AST 0 - 37 U/L _0 ALT 0 - 53 U/L _1 Alk Phosphatase 39 - 117 U/L 34(L) 45 55  Total Bilirubin 0.2 - 1.2 mg/dL 0.5 1.0 0.9  Bilirubin, Direct 0.0 - 0.3 mg/dL 0.1 - -    The ASCVD Risk score (Dunn., et al., 2013)  failed to calculate for the following reasons:   The valid total cholesterol range is 130 to 320 mg/dL   Patient has failed these meds in past: n/a Patient is currently controlled on the following medications:  . Rosuvastatin 10 mg daily . Lovaza 1 gm - 2 cap BID  We discussed:  diet and exercise extensively ;Cholesterol goals; benefits of statin for ASCVD risk reduction  Plan  Continue current medications and control with diet and exercise  Osteoporosis   Hx vertebral compression fracture  Last DEXA Scan: 12/26/2018  T-Score femoral neck: -2.7  T-Score total hip: n/a  T-Score lumbar spine: -1.5  T-Score forearm radius: n/a  Last vitamin D/Calcium Lab Results  Component Value Date   VD25OH 36.73 12/06/2018   CALCIUM 9.1 01/17/2020   Patient is a candidate for pharmacologic treatment due to history of vertebral fracture and T-Score < -2.5 in femoral neck  Patient has failed these meds in past: n/a Patient is currently controlled on the following medications:  . Prolia 60 mg q6 months (last given 10/03/2019) . Calcium carbonate 600 mg BID . Vitamin D 1000 IU daily  We discussed:  Recommend [id number] units of vitamin D daily. Recommend 1200 mg of calcium daily from dietary and supplemental sources.  -pt is due for next Prolia injection after 04/04/20  Plan  Continue current medications  GERD   Patient has failed these meds in past: n/a Patient is currently controlled on the following medications:  . Esomeprazole 40 mg daily  We discussed: Pt takes PPI daily, reports infrequent issues with GERD; also discussed protective benefits for GI bleeding given concomitant Eliquis and occasional NSAID use  Plan  Continue current medications  BPH / Overactive bladder   Lab Results  Component Value Date/Time   PSA 0.10 01/17/2020 02:03 PM   PSA 0.20 01/15/2019 09:26 AM   Patient has failed these meds in past: n/a Patient is currently controlled on the following  medications:  . Solifenacin 10 mg daily  We discussed:  Pt reports medication is working very well, denies issues. He gets up 1-2 times per night now.  Plan  Continue current medications  Back pain   Hx compression fracture  Patient has failed these meds in past: n/a Patient is currently controlled on the following medications:  . Nabumetone 500 mg BID prn  We discussed: pt reports he has not needed pain medication recently; he  used to use naproxen, now uses nabumetone sparingly; discussed increased risk of bleeding with Eliquis  Plan  Continue current medications  Erectile dysfunction   Patient has failed these meds in past: n/a Patient is currently controlled on the following medications:  . Tadalafil 5 mg PRN  We discussed:  Patient is satisfied with current regimen and denies issues  Plan  Continue current medications  Health Maintenance   Patient is currently controlled on the following medications:  Marland Kitchen Multivitamin (Centrum silver) - 2 daily  We discussed:  Patient is satisfied with current regimen and denies issues  Plan  Continue current medications  Vaccines   Reviewed and discussed patient's vaccination history.    Immunization History  Administered Date(s) Administered  . Fluad Quad(high Dose 65+) 12/06/2018, 01/17/2020  . Influenza Whole 01/19/2010  . Influenza, High Dose Seasonal PF 01/06/2016, 01/10/2017, 01/11/2018  . Influenza,inj,Quad PF,6+ Mos 11/28/2012, 12/03/2013, 12/25/2014  . PFIZER SARS-COV-2 Vaccination 04/15/2019, 05/08/2019, 02/01/2020  . Pneumococcal Conjugate-13 11/28/2012  . Pneumococcal Polysaccharide-23 12/03/2013, 01/17/2020  . Td 03/01/2006  . Tdap 04/07/2016  . Zoster 03/30/2013  . Zoster Recombinat (Shingrix) 10/18/2017, 05/09/2018    Plan  Up to date  Medication Management   Patient's preferred pharmacy is:  [facility] [address] [place]  [address] Phone: [phone] Fax: [place], [place] - [address] [place] [address] Phone: [phone] Fax: [place], [place] - [address] [place] [address] Phone: [phone] Fax: [phone]  [facility] [address] [place] [address] Phone: [phone] Fax: [phone]  Uses pill box? Yes Pt endorses 100% compliance  We discussed: Current pharmacy is preferred with insurance plan and patient is satisfied with pharmacy services  Plan  Continue current medication management strategy    Follow up: 12 month phone visit  Charlene Brooke, PharmD, BCACP Clinical Pharmacist [facility] [phone]

## 2020-03-24 NOTE — Addendum Note (Signed)
Addended by: Hinda Kehr on: 03/24/2020 09:55 AM   Modules accepted: Orders

## 2020-04-03 ENCOUNTER — Other Ambulatory Visit: Payer: Self-pay | Admitting: Internal Medicine

## 2020-04-04 ENCOUNTER — Telehealth: Payer: Self-pay | Admitting: Oncology

## 2020-04-04 NOTE — Telephone Encounter (Signed)
Scheduled per 01/07 los, patient has been called and notified.

## 2020-04-10 ENCOUNTER — Telehealth: Payer: Self-pay

## 2020-04-10 ENCOUNTER — Telehealth: Payer: Self-pay | Admitting: Pharmacist

## 2020-04-10 NOTE — Chronic Care Management (AMB) (Signed)
Chronic Care Management Pharmacy Assistant   Name: [name]  MRN: [id number] DOB: [birth year]  Reason for Encounter: Disease State / General Adherence Call  PCP : [name], MD  Allergies:   Allergies  Allergen Reactions   Hydrocodone Other (See Comments)    Caused confusion    Monosodium Glutamate Other (See Comments)    Caused headaches   Naproxen Sodium     Mouth blisters with prolonged use.  Left nephrectomy 2020   Other     Perfume - headaches     Medications: Outpatient Encounter Medications as of 04/10/2020  Medication Sig   apixaban (ELIQUIS) 5 MG TABS tablet Take 1 tablet (5 mg total) by mouth 2 (two) times daily.   Calcium Carbonate (CALCIUM 500 PO) Take 500 mg by mouth 2 (two) times daily.   denosumab (PROLIA) 60 MG/ML SOSY injection Inject 60 mg into the skin every 6 (six) months.   esomeprazole (NEXIUM) 40 MG capsule Take 1 capsule (40 mg total) by mouth daily before breakfast.   Multiple Vitamins-Minerals (CENTRUM MULTI + OMEGA 3 PO) Take 1 tablet by mouth daily.    nabumetone (RELAFEN) 500 MG tablet Take 500 mg by mouth 2 (two) times daily as needed for moderate pain.   naproxen sodium (ALEVE) 220 MG tablet Take 220 mg by mouth.   omega-3 acid ethyl esters (LOVAZA) 1 g capsule Take 2 capsules by mouth twice daily   rosuvastatin (CRESTOR) 10 MG tablet Take 1 tablet by mouth once daily   solifenacin (VESICARE) 10 MG tablet Take 1 tablet by mouth once daily   tadalafil (CIALIS) 5 MG tablet Take 5 mg by mouth daily as needed for erectile dysfunction.   VITAMIN D PO Take 1,000 Units by mouth daily.   No facility-administered encounter medications on file as of 04/10/2020.    Current Diagnosis: Patient Active Problem List   Diagnosis Date Noted   Stage 3b chronic kidney disease ([place]) 01/18/2020   Chronic bilateral low back pain without sciatica 07/18/2019   Renal cell carcinoma of left kidney ([place]) 01/15/2019   Compression  fracture of thoracic vertebra ([place]) 12/26/2018   Compression fracture of lumbar spine, non-traumatic ([place]) 12/26/2018   Iron deficiency anemia due to dietary causes 12/07/2018   Age-related osteoporosis with current pathological fracture 12/07/2018   Prediabetes 12/06/2018   Localized osteoporosis with current pathological fracture 12/06/2018   Chronic anticoagulation 08/22/2018   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 08/22/2018   Recurrent ventral incisional hernia 08/22/2018   Sick sinus syndrome ([place]) 07/18/2018   Intractable low back pain 06/23/2018   Lung nodule seen on imaging study 06/23/2018   Abdominal aortic aneurysm (AAA) without rupture ([place]) 06/23/2018   Cervical radiculopathy 06/16/2018   Essential hypertension 05/01/2018   OAB (overactive bladder) 01/06/2016   Erectile dysfunction due to arterial insufficiency 04/23/2015   Hyperlipidemia with target LDL less than 70 11/28/2012   Routine general medical examination at a health care facility 11/28/2012   PAF (paroxysmal atrial fibrillation) ([place]) 04/16/2011   GERD (gastroesophageal reflux disease) 11/10/2010   BPH (benign prostatic hyperplasia) 01/19/2010   HYPERTRIGLYCERIDEMIA 11/22/2007    Have you seen any other providers since your last visit with Charlene Brooke, [facility]?  No, patient has not seen any other providers since his last visit with Charlene Brooke, [facility].  Have you had any problems recently with your health?  Patient states chest was hurting this morning. Patient states he sent his cardiologist  a "signal" as he is currently wearing a cardiac monitor. Patient states he's doing better now and thinks he was just having some acid reflux this morning. Patient states he raised his bed up some and his symptoms resolved.  Have you had any problems with your pharmacy?  Patient states he has not had any problems with his pharmacy.  What issues or side effects are you  having with your medications?  Patient states he is not currently having any issues or any side effects from any of his medications.  What would you like me to pass along to [facility] for her to help you with?   Patient states he doesn't have anything for me to pass along at this time  What can we do to take care of you better?  Patient states "I've been doing real good."  Patient aware to call the office to schedule appointment to have his Prolia injection.  Future Appointments  Date Time Provider [place]  04/14/2020 10:20 AM Nahser, Wonda Cheng, MD [facility]  04/16/2020  7:35 AM [facility] DEVICE REMOTES [facility]  04/16/2020 10:40 AM Irene Shipper, MD [facility]  07/16/2020  7:35 AM [facility] DEVICE REMOTES [facility]  09/04/2020  1:30 PM Wyatt Portela, MD [facility] None  10/15/2020  7:35 AM [facility] DEVICE REMOTES [facility]  01/14/2021  7:35 AM [facility] DEVICE REMOTES [facility]  02/16/2021  1:00 PM [facility] CCM PHARMACIST [facility] None  04/15/2021  7:35 AM [facility] DEVICE REMOTES [facility]  07/15/2021  7:35 AM [facility] DEVICE REMOTES [facility]    April D Calhoun, [place] Pharmacist Assistant [phone]    Follow-Up:  Pharmacist Review

## 2020-04-10 NOTE — Telephone Encounter (Signed)
Returning phone call. Patient reports this morning he experienced increased fatigue and chest discomfort. States his heart rate on his fit bit was between 95-110 bpm. Another transmission was received, now is back in rhythm. Reports he now feels fine now. Compliant with all medications including Eliquis 5 mg BID. Advised patient I will forward to AF clinic to discuss options. Agreeable to plan. ED precautions given with verbal understanding.

## 2020-04-10 NOTE — Telephone Encounter (Signed)
Transmission received.

## 2020-04-10 NOTE — Telephone Encounter (Signed)
Patient called and wants to know if someone can call him back about his transmission

## 2020-04-10 NOTE — Telephone Encounter (Signed)
The patient states he had a little pain not a bad pain almost feel like heartburn. and draggy. I asked him to send a transmission with his monitor. He agreed to send it in a few minutes.

## 2020-04-11 NOTE — Telephone Encounter (Signed)
Per Adline Peals, PA pt scheduled to see Dr. Acie Fredrickson on 04/14/20 and is back in NSR.

## 2020-04-13 ENCOUNTER — Encounter: Payer: Self-pay | Admitting: Cardiovascular Disease
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
## 2020-04-14 ENCOUNTER — Encounter: Payer: Self-pay | Admitting: Cardiovascular Disease

## 2020-04-14 ENCOUNTER — Other Ambulatory Visit: Payer: Self-pay

## 2020-04-14 ENCOUNTER — Ambulatory Visit: Payer: Medicare Other | Admitting: Cardiovascular Disease

## 2020-04-14 VITALS — BP 122/76 | HR 63 | Ht 64.0 in | Wt 187.6 lb

## 2020-04-14 DIAGNOSIS — I10 Essential (primary) hypertension: Secondary | ICD-10-CM

## 2020-04-14 DIAGNOSIS — E785 Hyperlipidemia, unspecified: Secondary | ICD-10-CM

## 2020-04-14 DIAGNOSIS — I495 Sick sinus syndrome: Secondary | ICD-10-CM | POA: Diagnosis not present

## 2020-04-14 DIAGNOSIS — I480 Paroxysmal atrial fibrillation: Secondary | ICD-10-CM

## 2020-04-14 NOTE — Patient Instructions (Signed)
Medication Instructions:  Your physician recommends that you continue on your current medications as directed. Please refer to the Current Medication list given to you today.  *If you need a refill on your cardiac medications before your next appointment, please call your pharmacy*   Lab Work: Your physician recommends that you return for lab work in: 6 months on the day of or a few days before your office visit with Dr. Acie Fredrickson.  You will need to FAST for this appointment - nothing to eat or drink after midnight the night before except water.    Testing/Procedures: none   Follow-Up: At [facility], you and your health needs are our priority.  As part of our continuing mission to provide you with exceptional heart care, we have created designated Provider Care Teams.  These Care Teams include your primary Cardiologist (physician) and Advanced Practice Providers (APPs -  Physician Assistants and Nurse Practitioners) who all work together to provide you with the care you need, when you need it.  We recommend signing up for the patient portal called "MyChart".  Sign up information is provided on this After Visit Summary.  MyChart is used to connect with patients for Virtual Visits (Telemedicine).  Patients are able to view lab/test results, encounter notes, upcoming appointments, etc.  Non-urgent messages can be sent to your provider as well.   To learn more about what you can do with MyChart, go to [email or web address].    Your next appointment:   1 year(s)  The format for your next appointment:   In Person  Provider:   You may see Mertie Moores, MD or one of the following Advanced Practice Providers on your designated Care Team:    Richardson Dopp, PA-C  [place], [place]

## 2020-04-16 ENCOUNTER — Encounter: Payer: Self-pay | Admitting: Internal Medicine

## 2020-04-16 ENCOUNTER — Ambulatory Visit: Payer: Medicare Other | Admitting: Internal Medicine

## 2020-04-16 ENCOUNTER — Other Ambulatory Visit: Payer: Self-pay

## 2020-04-16 ENCOUNTER — Ambulatory Visit (INDEPENDENT_AMBULATORY_CARE_PROVIDER_SITE_OTHER): Payer: Medicare Other

## 2020-04-16 VITALS — BP 108/60 | HR 64 | Ht 64.0 in | Wt 187.0 lb

## 2020-04-16 DIAGNOSIS — K219 Gastro-esophageal reflux disease without esophagitis: Secondary | ICD-10-CM

## 2020-04-16 DIAGNOSIS — I495 Sick sinus syndrome: Secondary | ICD-10-CM

## 2020-04-16 LAB — CUP PACEART REMOTE DEVICE CHECK
Battery Remaining Longevity: 152 mo
Battery Voltage: 3.03 V
Brady Statistic AP VP Percent: 0.04 %
Brady Statistic AP VS Percent: 52.54 %
Brady Statistic AS VP Percent: 0 %
Brady Statistic AS VS Percent: 47.41 %
Brady Statistic RA Percent Paced: 52.67 %
Brady Statistic RV Percent Paced: 0.05 %
Date Time Interrogation Session: 20220216031158
Implantable Lead Implant Date: 20200519
Implantable Lead Implant Date: 20200519
Implantable Lead Location: 753859
Implantable Lead Location: 753860
Implantable Lead Model: 5076
Implantable Lead Model: 5076
Implantable Pulse Generator Implant Date: 20200519
Lead Channel Impedance Value: 304 Ohm
Lead Channel Impedance Value: 342 Ohm
Lead Channel Impedance Value: 532 Ohm
Lead Channel Impedance Value: 627 Ohm
Lead Channel Pacing Threshold Amplitude: 0.5 V
Lead Channel Pacing Threshold Amplitude: 0.5 V
Lead Channel Pacing Threshold Pulse Width: 0.4 ms
Lead Channel Pacing Threshold Pulse Width: 0.4 ms
Lead Channel Sensing Intrinsic Amplitude: 4.75 mV
Lead Channel Sensing Intrinsic Amplitude: 4.75 mV
Lead Channel Sensing Intrinsic Amplitude: 7 mV
Lead Channel Sensing Intrinsic Amplitude: 7 mV
Lead Channel Setting Pacing Amplitude: 1.5 V
Lead Channel Setting Pacing Amplitude: 2.5 V
Lead Channel Setting Pacing Pulse Width: 0.4 ms
Lead Channel Setting Sensing Sensitivity: 2 mV

## 2020-04-16 MED ORDER — ESOMEPRAZOLE MAGNESIUM 40 MG PO CPDR: 40.0000 mg | DELAYED_RELEASE_CAPSULE | Freq: Every day | ORAL | 3 refills | Status: DC

## 2020-04-16 NOTE — Patient Instructions (Signed)
We have sent the following medications to your pharmacy for you to pick up at your convenience:  Nexium  Please follow up in one year

## 2020-04-16 NOTE — Progress Notes (Signed)
HISTORY OF PRESENT ILLNESS:  [name] is a 80 y.o. male, previous patient of Dr. 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  He was last evaluated January 31, 2019.  He presents today for follow-up.  He continues to require Nexium 40 mg daily to control his reflux symptoms.  He has not had recurrent dysphagia.  He is tolerating the medication well without issues.  He has completed a colon cancer surveillance program.  No new complaints.  He has completed his COVID vaccination series  REVIEW OF SYSTEMS:  All non-GI ROS negative unless otherwise stated in the HPI except for irregular heartbeat, hearing problems  Past Medical History:  Diagnosis Date   Diverticulosis    Esophageal reflux    Esophageal stricture    HA (headache)    Hearing loss    Hiatal hernia    Hypertriglyceridemia    Hypoglycemia    Obesity, unspecified    PAF (paroxysmal atrial fibrillation) (HCC)    Echo with normal LV function and mild LVH   Presence of permanent cardiac pacemaker    Renal mass    left   Syncope    presented with atrial fib converted with Diltiazem   Systemic inflammatory response syndrome ([place]) 06/26/2018    Past Surgical History:  Procedure Laterality Date   CYSTOSCOPY WITH URETEROSCOPY AND STENT PLACEMENT Left 08/16/2018   Procedure: CYSTOSCOPY WITH LEFT RETROGRADE PYELOGRAM, BALLOON DILATION LEFT URETER, STENT PLACEMENT;  Surgeon: Lucas Mallow, MD;  [facility];  Service: Urology;  Laterality: Left;   HERNIA REPAIR     INSERT / REPLACE / REMOVE PACEMAKER     LAPAROSCOPIC ASSISTED VENTRAL HERNIA REPAIR N/A 10/18/2018   Procedure: LAPAROSCOPIC ASSISTED VENTRAL WALL HERNIA REPAIR WITH MESH;  Surgeon: Michael Boston, MD;  [facility];  Service: General;  Laterality: N/A;   LAPAROSCOPIC NEPHRECTOMY, HAND ASSISTED Left 10/18/2018   Procedure: LEFT HAND  ASSISTED LAPAROSCOPIC RADICAL NEPHRECTOMY WITH ADRENALECTOMY;  Surgeon: Lucas Mallow, MD;  [facility];  Service: Urology;  Laterality: Left;   LOOP RECORDER INSERTION N/A 04/26/2018   Procedure: LOOP RECORDER INSERTION;  Surgeon: Thompson Grayer, MD;  Location: [place] [facility];  Service: Cardiovascular;  Laterality: N/A;   LOOP RECORDER REMOVAL N/A 07/18/2018   Procedure: LOOP RECORDER REMOVAL;  Surgeon: Thompson Grayer, MD;  Location: [place] [facility];  Service: Cardiovascular;  Laterality: N/A;   NASAL SEPTUM SURGERY     PACEMAKER IMPLANT N/A 07/18/2018   Procedure: PACEMAKER IMPLANT;  Surgeon: Thompson Grayer, MD;  Location: [place] [facility];  Service: Cardiovascular;  Laterality: N/A;   TONSILLECTOMY     TONSILLECTOMY     UMBILICAL HERNIA REPAIR  1993   WISDOM TOOTH EXTRACTION     about 80 years old    Social History [place].  reports that he quit smoking about 33 years ago. He has never used smokeless tobacco. He reports that he does not drink alcohol and does not use drugs.  family history includes Bipolar disorder in his paternal grandmother and sister; Emphysema in his father; Gallbladder disease in his sister.  Allergies  Allergen Reactions   Hydrocodone Other (See Comments)    Caused confusion    Monosodium Glutamate Other (See Comments)    Caused headaches   Naproxen Sodium     Mouth blisters with prolonged use.  Left nephrectomy 2020  Other     Perfume - headaches        PHYSICAL EXAMINATION: Vital signs: BP 108/60    Pulse 64    Ht 5\' 4"  (1.626 m)    Wt 187 lb (84.8 kg)    SpO2 92%    BMI 32.10 kg/m   Constitutional: generally well-appearing, no acute distress Psychiatric: alert and oriented x3, cooperative Eyes: extraocular movements intact, anicteric, conjunctiva pink Mouth: oral pharynx moist, no lesions Neck: supple no lymphadenopathy Cardiovascular: heart regular rate and rhythm, no murmur Lungs: clear to auscultation  bilaterally Abdomen: soft, nontender, nondistended, no obvious ascites, no peritoneal signs, normal bowel sounds, no organomegaly Rectal: Omitted Extremities: no lower extremity edema bilaterally Skin: no lesions on visible extremities Neuro: No focal deficits.  Cranial nerves intact  ASSESSMENT:  1.  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  Asymptomatic post dilation on PPI 2.  History of nonadvanced adenomas.  Completed colonoscopy surveillance program   PLAN:  1.  Reflux precautions 2.  Refill Nexium 40 mg daily.  Medication risks reviewed.  Multiple questions answered 3.  Routine GI follow-up 1 year

## 2020-04-23 NOTE — Progress Notes (Signed)
Remote pacemaker transmission.   

## 2020-04-25 ENCOUNTER — Other Ambulatory Visit: Payer: Self-pay

## 2020-04-25 ENCOUNTER — Ambulatory Visit (INDEPENDENT_AMBULATORY_CARE_PROVIDER_SITE_OTHER): Payer: Medicare Other

## 2020-04-25 DIAGNOSIS — M8000XD Age-related osteoporosis with current pathological fracture, unspecified site, subsequent encounter for fracture with routine healing: Secondary | ICD-10-CM

## 2020-04-25 MED ORDER — DENOSUMAB 60 MG/ML ~~LOC~~ SOSY
60.0000 mg | PREFILLED_SYRINGE | Freq: Once | SUBCUTANEOUS | Status: AC
  Administered 2020-04-25: 60 mg via SUBCUTANEOUS

## 2020-04-25 NOTE — Progress Notes (Signed)
Pt here for Prolia injection per Dr [name].  Prolia 60mg  given subcutaneous and pt tolerated injection well.  Next Prolia injection scheduled for 10/29/20.

## 2020-04-30 ENCOUNTER — Other Ambulatory Visit: Payer: Self-pay | Admitting: Internal Medicine

## 2020-04-30 DIAGNOSIS — E785 Hyperlipidemia, unspecified: Secondary | ICD-10-CM

## 2020-05-04 ENCOUNTER — Other Ambulatory Visit: Payer: Self-pay | Admitting: Cardiovascular Disease

## 2020-05-04 DIAGNOSIS — I480 Paroxysmal atrial fibrillation: Secondary | ICD-10-CM

## 2020-05-05 ENCOUNTER — Other Ambulatory Visit: Payer: Self-pay | Admitting: Cardiovascular Disease

## 2020-05-05 DIAGNOSIS — I480 Paroxysmal atrial fibrillation: Secondary | ICD-10-CM

## 2020-05-05 NOTE — Telephone Encounter (Signed)
Prescription refill request for Eliquis received. Indication: Afib Last office visit: Nahser, 04/14/2020 Scr: 1.35, 01/17/2020 Age: 80 yo  Weight: 84.8 kg   Pt is on the correct dose of Eliquis per dosing criteria, prescription refill sent for Eliquis 5mg  BID.

## 2020-05-05 NOTE — Telephone Encounter (Signed)
Prescription refill request for Eliquis received. Indication: a fib Last office visit: 04/14/20 Scr:  1.35 Age: 80 Weight: 84 kg

## 2020-05-21 ENCOUNTER — Telehealth: Payer: Self-pay | Admitting: Pharmacist

## 2020-05-21 ENCOUNTER — Other Ambulatory Visit: Payer: Self-pay | Admitting: Internal Medicine

## 2020-05-21 DIAGNOSIS — F4320 Adjustment disorder, unspecified: Secondary | ICD-10-CM | POA: Insufficient documentation

## 2020-05-21 DIAGNOSIS — F4321 Adjustment disorder with depressed mood: Secondary | ICD-10-CM | POA: Insufficient documentation

## 2020-05-21 MED ORDER — LORAZEPAM 0.5 MG PO TABS: 0.5000 mg | ORAL_TABLET | Freq: Three times a day (TID) | ORAL | 0 refills | Status: DC | PRN

## 2020-05-21 MED ORDER — LORAZEPAM 0.5 MG PO TABS
0.5000 mg | ORAL_TABLET | Freq: Three times a day (TID) | ORAL | 0 refills | Status: DC | PRN
Start: 2020-05-21 — End: 2020-06-20

## 2020-05-21 NOTE — Chronic Care Management (AMB) (Signed)
    Chronic Care Management Pharmacy Assistant   Name: [name]  MRN: [id number] DOB: 1940/07/16  Reason for Encounter: Patient Call  Medications: Outpatient Encounter Medications as of 05/21/2020  Medication Sig  . Calcium Carbonate (CALCIUM 500 PO) Take 500 mg by mouth 2 (two) times daily.  Marland Kitchen denosumab (PROLIA) 60 MG/ML SOSY injection Inject 60 mg into the skin every 6 (six) months.  Marland Kitchen ELIQUIS 5 MG TABS tablet TAKE 1 TABLET BY MOUTH TWICE DAILY  . esomeprazole (NEXIUM) 40 MG capsule Take 1 capsule (40 mg total) by mouth daily before breakfast.  . Multiple Vitamins-Minerals (CENTRUM MULTI + OMEGA 3 PO) Take 1 tablet by mouth daily.   . nabumetone (RELAFEN) 500 MG tablet Take 500 mg by mouth 2 (two) times daily as needed for moderate pain.  . naproxen sodium (ALEVE) 220 MG tablet Take 220 mg by mouth.  . omega-3 acid ethyl esters (LOVAZA) 1 g capsule Take 2 capsules by mouth twice daily  . rosuvastatin (CRESTOR) 10 MG tablet Take 1 tablet by mouth once daily  . solifenacin (VESICARE) 10 MG tablet Take 1 tablet by mouth once daily  . VITAMIN D PO Take 1,000 Units by mouth daily.   No facility-administered encounter medications on file as of 05/21/2020.     Patient called stating his daughter passed away suddenly yesterday. He would like to know if there is anything he can take to help with his mood and anxiety to help him handle her passing. Patient states this is really hard for him and he would like to know if there is something he can take that will not interfere with his heart medications.  Patient would like a call back.  Time Spent: 15 minutes  April D Calhoun, [place] Pharmacist Assistant [phone]

## 2020-05-21 NOTE — Telephone Encounter (Signed)
Patient called pharmacist assistant. He just lost his daughter unexpectedly yesterday and is understandably distraught. He is requesting a medication to help him through this very difficult time. He is worried about potential drug interactions with his heart medications.  I recommend lorazepam 0.5 mg BID as needed. Lorazepam is one of the safer benzodiazepines to use in age > 30 as it does not have active metabolites. It will not interact significantly with patient's other medications.  May consider starting an SSRI (sertraline 25 mg) at the same time, but it will take 4-6 weeks for benefit.

## 2020-05-29 ENCOUNTER — Telehealth: Payer: Self-pay | Admitting: Pharmacist

## 2020-05-29 NOTE — Progress Notes (Signed)
    Chronic Care Management Pharmacy Assistant   Name: [name]  MRN: [id number] DOB: 06/30/1940   Reason for Encounter: Chart Review    Medications: Outpatient Encounter Medications as of 05/29/2020  Medication Sig  . Calcium Carbonate (CALCIUM 500 PO) Take 500 mg by mouth 2 (two) times daily.  Marland Kitchen denosumab (PROLIA) 60 MG/ML SOSY injection Inject 60 mg into the skin every 6 (six) months.  Marland Kitchen ELIQUIS 5 MG TABS tablet TAKE 1 TABLET BY MOUTH TWICE DAILY  . esomeprazole (NEXIUM) 40 MG capsule Take 1 capsule (40 mg total) by mouth daily before breakfast.  . LORazepam (ATIVAN) 0.5 MG tablet Take 1 tablet (0.5 mg total) by mouth every 8 (eight) hours as needed for anxiety.  . Multiple Vitamins-Minerals (CENTRUM MULTI + OMEGA 3 PO) Take 1 tablet by mouth daily.   Marland Kitchen omega-3 acid ethyl esters (LOVAZA) 1 g capsule Take 2 capsules by mouth twice daily  . rosuvastatin (CRESTOR) 10 MG tablet Take 1 tablet by mouth once daily  . solifenacin (VESICARE) 10 MG tablet Take 1 tablet by mouth once daily  . VITAMIN D PO Take 1,000 Units by mouth daily.   No facility-administered encounter medications on file as of 05/29/2020.   Pharmacist Review:  Reviewed chart for medication changes and adherence.   No gaps in adherence identified. Patient has follow up scheduled with pharmacy team. No further action required.     [place] Pharmacist Assistant [phone]

## 2020-06-20 ENCOUNTER — Telehealth: Payer: Self-pay | Admitting: Pharmacist

## 2020-06-20 NOTE — Progress Notes (Signed)
    Chronic Care Management Pharmacy Assistant   Name: [name]  MRN: [id number] DOB: 11-11-1940   Reason for Encounter: General Disease State Call    Recent office visits:     Recent consult visits:  04/14/20 Dr. Grayland Jack, no medication Changes 04/16/20 Dr. Scarlette Shorts, no medication changes  Hospital visits:  None in previous 6 months  Medications: Outpatient Encounter Medications as of 06/20/2020  Medication Sig  . Calcium Carbonate (CALCIUM 500 PO) Take 500 mg by mouth 2 (two) times daily.  Marland Kitchen denosumab (PROLIA) 60 MG/ML SOSY injection Inject 60 mg into the skin every 6 (six) months.  Marland Kitchen ELIQUIS 5 MG TABS tablet TAKE 1 TABLET BY MOUTH TWICE DAILY  . esomeprazole (NEXIUM) 40 MG capsule Take 1 capsule (40 mg total) by mouth daily before breakfast.  . LORazepam (ATIVAN) 0.5 MG tablet Take 1 tablet (0.5 mg total) by mouth every 8 (eight) hours as needed for anxiety.  . Multiple Vitamins-Minerals (CENTRUM MULTI + OMEGA 3 PO) Take 1 tablet by mouth daily.   Marland Kitchen omega-3 acid ethyl esters (LOVAZA) 1 g capsule Take 2 capsules by mouth twice daily  . rosuvastatin (CRESTOR) 10 MG tablet Take 1 tablet by mouth once daily  . solifenacin (VESICARE) 10 MG tablet Take 1 tablet by mouth once daily  . VITAMIN D PO Take 1,000 Units by mouth daily.   No facility-administered encounter medications on file as of 06/20/2020.   Have you had any problems recently with your health? Patient states that he is not having any new health issues. He did state that his daughter died in her sleep last month and he had started taking lorazepam. Patient states that he no longer takes it because it did not help. States that he is better coping with his daughter's death.  Have you had any problems with your pharmacy? Patient states that he does not have any problems with getting his medications or the cost of his medications from the pharmacy  What issues or side effects are you having with your  medications? Patient states that he is not having any side effects from medications  What would you like me to pass along to [name] for them to help you with? Patient states that he does not have any concerns about his health or medications at this time   What can we do to take care of you better? Patient states that if he has any changes in his health he will call [name]  Star Rating Drugs: Rosuvastatin 05/20/20 90 ds  [place] Pharmacist Assistant [phone]  Time spent:20

## 2020-06-20 NOTE — Addendum Note (Signed)
Addended by: Charlton Haws on: 06/20/2020 04:23 PM   Modules accepted: Orders

## 2020-06-30 DIAGNOSIS — K449 Diaphragmatic hernia without obstruction or gangrene: Secondary | ICD-10-CM | POA: Diagnosis not present

## 2020-06-30 DIAGNOSIS — N281 Cyst of kidney, acquired: Secondary | ICD-10-CM | POA: Diagnosis not present

## 2020-06-30 DIAGNOSIS — C642 Malignant neoplasm of left kidney, except renal pelvis: Secondary | ICD-10-CM | POA: Diagnosis not present

## 2020-06-30 DIAGNOSIS — S22080D Wedge compression fracture of T11-T12 vertebra, subsequent encounter for fracture with routine healing: Secondary | ICD-10-CM | POA: Diagnosis not present

## 2020-06-30 DIAGNOSIS — C649 Malignant neoplasm of unspecified kidney, except renal pelvis: Secondary | ICD-10-CM | POA: Diagnosis not present

## 2020-06-30 DIAGNOSIS — R911 Solitary pulmonary nodule: Secondary | ICD-10-CM | POA: Diagnosis not present

## 2020-07-03 DIAGNOSIS — C7972 Secondary malignant neoplasm of left adrenal gland: Secondary | ICD-10-CM | POA: Diagnosis not present

## 2020-07-03 DIAGNOSIS — C642 Malignant neoplasm of left kidney, except renal pelvis: Secondary | ICD-10-CM | POA: Diagnosis not present

## 2020-07-03 DIAGNOSIS — D49511 Neoplasm of unspecified behavior of right kidney: Secondary | ICD-10-CM | POA: Diagnosis not present

## 2020-07-16 ENCOUNTER — Ambulatory Visit (INDEPENDENT_AMBULATORY_CARE_PROVIDER_SITE_OTHER): Payer: Medicare Other

## 2020-07-16 DIAGNOSIS — I495 Sick sinus syndrome: Secondary | ICD-10-CM | POA: Diagnosis not present

## 2020-07-17 LAB — CUP PACEART REMOTE DEVICE CHECK
Battery Remaining Longevity: 149 mo
Battery Voltage: 3.03 V
Brady Statistic AP VP Percent: 0.03 %
Brady Statistic AP VS Percent: 45.13 %
Brady Statistic AS VP Percent: 0.01 %
Brady Statistic AS VS Percent: 54.83 %
Brady Statistic RA Percent Paced: 45.24 %
Brady Statistic RV Percent Paced: 0.04 %
Date Time Interrogation Session: 20220517214412
Implantable Lead Implant Date: 20200519
Implantable Lead Implant Date: 20200519
Implantable Lead Location: 753859
Implantable Lead Location: 753860
Implantable Lead Model: 5076
Implantable Lead Model: 5076
Implantable Pulse Generator Implant Date: 20200519
Lead Channel Impedance Value: 304 Ohm
Lead Channel Impedance Value: 342 Ohm
Lead Channel Impedance Value: 475 Ohm
Lead Channel Impedance Value: 570 Ohm
Lead Channel Pacing Threshold Amplitude: 0.5 V
Lead Channel Pacing Threshold Amplitude: 0.625 V
Lead Channel Pacing Threshold Pulse Width: 0.4 ms
Lead Channel Pacing Threshold Pulse Width: 0.4 ms
Lead Channel Sensing Intrinsic Amplitude: 4 mV
Lead Channel Sensing Intrinsic Amplitude: 4 mV
Lead Channel Sensing Intrinsic Amplitude: 6.875 mV
Lead Channel Sensing Intrinsic Amplitude: 6.875 mV
Lead Channel Setting Pacing Amplitude: 1.5 V
Lead Channel Setting Pacing Amplitude: 2.5 V
Lead Channel Setting Pacing Pulse Width: 0.4 ms
Lead Channel Setting Sensing Sensitivity: 2 mV

## 2020-07-21 ENCOUNTER — Encounter: Payer: Self-pay | Admitting: Student

## 2020-07-21 ENCOUNTER — Other Ambulatory Visit: Payer: Self-pay

## 2020-07-21 ENCOUNTER — Ambulatory Visit: Payer: Medicare Other | Admitting: Student

## 2020-07-21 VITALS — BP 102/50 | HR 60 | Ht 64.0 in | Wt 187.8 lb

## 2020-07-21 DIAGNOSIS — I480 Paroxysmal atrial fibrillation: Secondary | ICD-10-CM

## 2020-07-21 DIAGNOSIS — I10 Essential (primary) hypertension: Secondary | ICD-10-CM

## 2020-07-21 DIAGNOSIS — I495 Sick sinus syndrome: Secondary | ICD-10-CM | POA: Diagnosis not present

## 2020-07-21 DIAGNOSIS — E785 Hyperlipidemia, unspecified: Secondary | ICD-10-CM

## 2020-07-21 LAB — CUP PACEART INCLINIC DEVICE CHECK
Battery Remaining Longevity: 149 mo
Battery Voltage: 3.03 V
Brady Statistic AP VP Percent: 0.03 %
Brady Statistic AP VS Percent: 46.7 %
Brady Statistic AS VP Percent: 0.01 %
Brady Statistic AS VS Percent: 53.26 %
Brady Statistic RA Percent Paced: 46.53 %
Brady Statistic RV Percent Paced: 0.1 %
Date Time Interrogation Session: 20220523142147
Implantable Lead Implant Date: 20200519
Implantable Lead Implant Date: 20200519
Implantable Lead Location: 753859
Implantable Lead Location: 753860
Implantable Lead Model: 5076
Implantable Lead Model: 5076
Implantable Pulse Generator Implant Date: 20200519
Lead Channel Impedance Value: 342 Ohm
Lead Channel Impedance Value: 361 Ohm
Lead Channel Impedance Value: 532 Ohm
Lead Channel Impedance Value: 608 Ohm
Lead Channel Pacing Threshold Amplitude: 0.5 V
Lead Channel Pacing Threshold Amplitude: 0.5 V
Lead Channel Pacing Threshold Pulse Width: 0.4 ms
Lead Channel Pacing Threshold Pulse Width: 0.4 ms
Lead Channel Sensing Intrinsic Amplitude: 10.5 mV
Lead Channel Sensing Intrinsic Amplitude: 4.625 mV
Lead Channel Sensing Intrinsic Amplitude: 5.375 mV
Lead Channel Sensing Intrinsic Amplitude: 6.5 mV
Lead Channel Setting Pacing Amplitude: 1.5 V
Lead Channel Setting Pacing Amplitude: 2.5 V
Lead Channel Setting Pacing Pulse Width: 0.4 ms
Lead Channel Setting Sensing Sensitivity: 2 mV

## 2020-07-21 LAB — BASIC METABOLIC PANEL
BUN/Creatinine Ratio: 15 (ref 10–24)
BUN: 18 mg/dL (ref 8–27)
CO2: 21 mmol/L (ref 20–29)
Calcium: 9.1 mg/dL (ref 8.6–10.2)
Chloride: 100 mmol/L (ref 96–106)
Creatinine, Ser: 1.17 mg/dL (ref 0.76–1.27)
Glucose: 117 mg/dL — ABNORMAL HIGH (ref 65–99)
Potassium: 4.3 mmol/L (ref 3.5–5.2)
Sodium: 135 mmol/L (ref 134–144)
eGFR: 64 mL/min/{1.73_m2} (ref >59)

## 2020-07-21 LAB — CBC
Hematocrit: 37.9 % (ref 37.5–51.0)
Hemoglobin: 13.5 g/dL (ref 13.0–17.7)
MCH: 33.5 pg — ABNORMAL HIGH (ref 26.6–33.0)
MCHC: 35.6 g/dL (ref 31.5–35.7)
MCV: 94 fL (ref 79–97)
Platelets: 110 10*3/uL — ABNORMAL LOW (ref 150–450)
RBC: 4.03 x10E6/uL — ABNORMAL LOW (ref 4.14–5.80)
RDW: 13.2 % (ref 11.6–15.4)
WBC: 6.2 10*3/uL (ref 3.4–10.8)

## 2020-07-21 NOTE — Progress Notes (Signed)
Electrophysiology Office Note Date: 07/21/2020  ID:  Frank Nicasio., DOB 12/09/1940, MRN [id number]  PCP: [name], MD Primary Cardiologist: Mertie Moores, MD Electrophysiologist: Thompson Grayer, MD   CC: Pacemaker follow-up  [name] is a 80 y.o. male seen today for Thompson Grayer, MD for routine electrophysiology followup.  Since last being seen in our clinic the patient reports doing well from a health standpoint. He unfortunately lost his daughter (in her 36s) this year from health complications.   He himself is doing well. He does not notice it when he is in AF. He does work around the house and yard without difficulty. No bleeding on Eliquis. he denies chest pain, palpitations, dyspnea, PND, orthopnea, nausea, vomiting, dizziness, syncope, edema, weight gain, or early satiety.  Device History: Device information 07/18/2018 MDT dual chamber PPM  (loop removed)  AFib Hx Diagnosis goes back to the start of his Epic record (2013) AAD None to date that I can find  Past Medical History:  Diagnosis Date  . Diverticulosis   . Esophageal reflux   . Esophageal stricture   . HA (headache)   . Hearing loss   . Hiatal hernia   . Hypertriglyceridemia   . Hypoglycemia   . Obesity, unspecified   . PAF (paroxysmal atrial fibrillation) (HCC)    Echo with normal LV function and mild LVH  . Presence of permanent cardiac pacemaker   . Renal mass    left  . Syncope    presented with atrial fib converted with Diltiazem  . Systemic inflammatory response syndrome ([place]) 06/26/2018   Past Surgical History:  Procedure Laterality Date  . CYSTOSCOPY WITH URETEROSCOPY AND STENT PLACEMENT Left 08/16/2018   Procedure: CYSTOSCOPY WITH LEFT RETROGRADE PYELOGRAM, BALLOON DILATION LEFT URETER, STENT PLACEMENT;  Surgeon: Lucas Mallow, MD;  Location: [facility];  Service: Urology;  Laterality: Left;  . HERNIA REPAIR    . INSERT / REPLACE / REMOVE PACEMAKER    . LAPAROSCOPIC  ASSISTED VENTRAL HERNIA REPAIR N/A 10/18/2018   Procedure: LAPAROSCOPIC ASSISTED VENTRAL WALL HERNIA REPAIR WITH MESH;  Surgeon: Paras Kreider Boston, MD;  Location: [facility];  Service: General;  Laterality: N/A;  . LAPAROSCOPIC NEPHRECTOMY, HAND ASSISTED Left 10/18/2018   Procedure: LEFT HAND ASSISTED LAPAROSCOPIC RADICAL NEPHRECTOMY WITH ADRENALECTOMY;  Surgeon: Lucas Mallow, MD;  Location: [facility];  Service: Urology;  Laterality: Left;  . LOOP RECORDER INSERTION N/A 04/26/2018   Procedure: LOOP RECORDER INSERTION;  Surgeon: Thompson Grayer, MD;  Location: [place] [facility];  Service: Cardiovascular;  Laterality: N/A;  . LOOP RECORDER REMOVAL N/A 07/18/2018   Procedure: LOOP RECORDER REMOVAL;  Surgeon: Thompson Grayer, MD;  Location: [place] [facility];  Service: Cardiovascular;  Laterality: N/A;  . NASAL SEPTUM SURGERY    . PACEMAKER IMPLANT N/A 07/18/2018   Procedure: PACEMAKER IMPLANT;  Surgeon: Thompson Grayer, MD;  Location: [place] [facility];  Service: Cardiovascular;  Laterality: N/A;  . TONSILLECTOMY    . TONSILLECTOMY    . UMBILICAL HERNIA REPAIR  1993  . WISDOM TOOTH EXTRACTION     about 79 years old    Current Outpatient Medications  Medication Sig Dispense Refill  . Calcium Carbonate (CALCIUM 500 PO) Take 500 mg by mouth 2 (two) times daily.    Marland Kitchen denosumab (PROLIA) 60 MG/ML SOSY injection Inject 60 mg into the skin every 6 (six) months.    Marland Kitchen ELIQUIS 5 MG TABS tablet TAKE 1 TABLET BY MOUTH  TWICE DAILY 180 tablet 1  . esomeprazole (NEXIUM) 40 MG capsule Take 1 capsule (40 mg total) by mouth daily before breakfast. 90 capsule 3  . Multiple Vitamins-Minerals (CENTRUM MULTI + OMEGA 3 PO) Take 1 tablet by mouth daily.     Marland Kitchen omega-3 acid ethyl esters (LOVAZA) 1 g capsule Take 2 capsules by mouth twice daily 360 capsule 1  . rosuvastatin (CRESTOR) 10 MG tablet Take 1 tablet by mouth once daily 90 tablet 1  . solifenacin (VESICARE) 10 MG tablet Take 1 tablet by mouth once daily 90 tablet 1  .  VITAMIN D PO Take 1,000 Units by mouth daily.     No current facility-administered medications for this visit.    Allergies:   Hydrocodone, Monosodium glutamate, Naproxen sodium, and Other   Social History: Social History   Socioeconomic History  . Marital status: Married    Spouse name: Not on file  . Number of children: 1  . Years of education: Not on file  . Highest education level: Not on file  Occupational History  . 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 Smoking status: Former Smoker    Quit date: 03/02/1987    Years since quitting: 33.4  . Smokeless tobacco: Never Used  Vaping Use  . Vaping Use: Never used  Substance and Sexual Activity  . Alcohol use: No    Alcohol/week: 0.0 standard drinks  . Drug use: No  . Sexual activity: Not Currently  Other Topics Concern  . Not on file  Social History Narrative  . Not on file   Social Determinants of Health   Financial Resource Strain: Low Risk   . Difficulty of Paying Living Expenses: Not hard at all  Food Insecurity: No Food Insecurity  . Worried About Charity fundraiser in the Last Year: Never true  . Ran Out of Food in the Last Year: Never true  Transportation Needs: No Transportation Needs  . Lack of Transportation (Medical): No  . Lack of Transportation (Non-Medical): No  Physical Activity: Sufficiently Active  . Days of Exercise per Week: 7 days  . Minutes of Exercise per Session: 60 min  Stress: No Stress Concern Present  . Feeling of Stress : Not at all  Social Connections: Socially Integrated  . Frequency of Communication with Friends and Family: More than three times a week  . Frequency of Social Gatherings with Friends and Family: More than three times a week  . Attends Religious Services: More than 4 times per year  . Active Member of Clubs or Organizations: Yes  . Attends Archivist Meetings: More than 4 times per year  . Marital Status: Married  Arboriculturist Violence: Not on file    Family History: Family History  Problem Relation Age of Onset  . Emphysema Father   . Gallbladder disease Sister   . Bipolar disorder Sister   . Bipolar disorder Paternal Grandmother   . Colon cancer Neg Hx      Review of Systems: All other systems reviewed and are otherwise negative except as noted above.  Physical Exam: Vitals:   07/21/20 1134  BP: (!) 102/50  Pulse: 60  SpO2: 97%  Weight: 187 lb 12.8 oz (85.2 kg)  Height: 5\' 4"  (1.626 m)     GEN- The patient is well appearing, alert and oriented x 3 today.   HEENT: normocephalic, atraumatic; sclera clear, conjunctiva pink; hearing intact; oropharynx clear; neck supple  Lungs- Clear to ausculation bilaterally, normal work of breathing.  No wheezes, rales, rhonchi Heart- Regular rate and rhythm, no murmurs, rubs or gallops  GI- soft, non-tender, non-distended, bowel sounds present  Extremities- no clubbing or cyanosis. No edema MS- no significant deformity or atrophy Skin- warm and dry, no rash or lesion; PPM pocket well healed Psych- euthymic mood, full affect Neuro- strength and sensation are intact  PPM Interrogation- reviewed in detail today,  See PACEART report  EKG:  EKG is not ordered today.  Recent Labs: 01/17/2020: ALT 26; BUN 20; Creatinine, Ser 1.35; Hemoglobin 13.9; Platelets 120.0; Potassium 3.9; Sodium 138; TSH 2.21   Wt Readings from Last 3 Encounters:  07/21/20 187 lb 12.8 oz (85.2 kg)  04/16/20 187 lb (84.8 kg)  04/14/20 187 lb 9.6 oz (85.1 kg)     Other studies Reviewed: Additional studies/ records that were reviewed today include: Previous EP office notes, Previous remote checks, Most recent labwork.   Assessment and Plan:  1. Sick sinus syndrome s/p Medtronic PPM  Normal PPM function See Pace Art report No changes today  2. PAF CHA2DS2VASC of at least 3 on appropriately dosed Eliquis   Well controlled with current burden <1% on device check today.   BMET and CBC today.  3. HLD Discussed labs, heart healthy diet today. Will defer any med changes to PCP who follows.  Current medicines are reviewed at length with the patient today.   The patient does not have concerns regarding his medicines.  The following changes were made today:  none  Labs/ tests ordered today include:  Orders Placed This Encounter  Procedures  . Basic metabolic panel  . CBC   Disposition:   Follow up with EP APP in 6 Months   Signed, Annamaria Helling  07/21/2020 12:14 PM  [address] [place] [address] [phone] (office) [phone] (fax)

## 2020-07-21 NOTE — Patient Instructions (Addendum)
Medication Instructions:  Your physician recommends that you continue on your current medications as directed. Please refer to the Current Medication list given to you today.  *If you need a refill on your cardiac medications before your next appointment, please call your pharmacy*   Lab Work: TODAY: BMET, CBC  If you have labs (blood work) drawn today and your tests are completely normal, you will receive your results only by: Marland Kitchen MyChart Message (if you have MyChart) OR . A paper copy in the mail If you have any lab test that is abnormal or we need to change your treatment, we will call you to review the results.   Follow-Up: At [facility], you and your health needs are our priority.  As part of our continuing mission to provide you with exceptional heart care, we have created designated Provider Care Teams.  These Care Teams include your primary Cardiologist (physician) and Advanced Practice Providers (APPs -  Physician Assistants and Nurse Practitioners) who all work together to provide you with the care you need, when you need it.  Your next appointment:   6 month(s)  The format for your next appointment:   In Person  Provider:   Legrand Como "Jonni Sanger" Chalmers Cater, PA-C    Look into an APPLE WATCH (Series 4 or newer) or a [place] with EKG capabilities for monitoring your AF.

## 2020-08-04 ENCOUNTER — Other Ambulatory Visit: Payer: Self-pay | Admitting: Internal Medicine

## 2020-08-04 ENCOUNTER — Telehealth: Payer: Self-pay | Admitting: Pharmacist

## 2020-08-04 DIAGNOSIS — E781 Pure hyperglyceridemia: Secondary | ICD-10-CM

## 2020-08-04 NOTE — Progress Notes (Addendum)
    Chronic Care Management Pharmacy Assistant   Name: Cynthia Stainback.  MRN: [id number] DOB: 10/09/1940   Reason for Encounter: Disease State General Call    Recent office visits:  None ID  Recent consult visits:  07/21/20 Dr. Wynonia Lawman Cardiology  [facility] visits:  None in previous 6 months  Medications: Outpatient Encounter Medications as of 08/04/2020  Medication Sig   Calcium Carbonate (CALCIUM 500 PO) Take 500 mg by mouth 2 (two) times daily.   denosumab (PROLIA) 60 MG/ML SOSY injection Inject 60 mg into the skin every 6 (six) months.   ELIQUIS 5 MG TABS tablet TAKE 1 TABLET BY MOUTH TWICE DAILY   esomeprazole (NEXIUM) 40 MG capsule Take 1 capsule (40 mg total) by mouth daily before breakfast.   Multiple Vitamins-Minerals (CENTRUM MULTI + OMEGA 3 PO) Take 1 tablet by mouth daily.    omega-3 acid ethyl esters (LOVAZA) 1 g capsule Take 2 capsules by mouth twice daily   rosuvastatin (CRESTOR) 10 MG tablet Take 1 tablet by mouth once daily   solifenacin (VESICARE) 10 MG tablet Take 1 tablet by mouth once daily   VITAMIN D PO Take 1,000 Units by mouth daily.   No facility-administered encounter medications on file as of 08/04/2020.   Pharmacist Review  Have you had any problems recently with your health? Patient states that he does not have any new health issues. He states that he is feeling better about his daughter's passing. He did say that he no longer has to take the Lorazepam.  Have you had any problems with your pharmacy? Patient states that he does not have any problems with getting his medications or the cost of the medications from the pharmacy  What issues or side effects are you having with your medications? Patient states that he is not having any side effects from any medications  What would you like me to pass along to [facility] for them to help you with?  Patient states that he is doing well and he does not have any concerns about his health or  medications at this time  What can we do to take care of you better? Patient states that if anything changes he will contact [name] office Star Rating Drugs: Rosuvastatin 05/20/20 90 d   [place] Pharmacist Assistant [phone]  Time spent:20

## 2020-08-07 NOTE — Progress Notes (Signed)
Remote pacemaker transmission.   

## 2020-09-04 ENCOUNTER — Other Ambulatory Visit: Payer: Self-pay

## 2020-09-04 ENCOUNTER — Inpatient Hospital Stay: Payer: Medicare Other | Attending: Oncology | Admitting: Oncology

## 2020-09-04 VITALS — BP 113/62 | HR 64 | Temp 97.9°F | Resp 18 | Ht 64.0 in | Wt 187.5 lb

## 2020-09-04 DIAGNOSIS — Z85528 Personal history of other malignant neoplasm of kidney: Secondary | ICD-10-CM | POA: Diagnosis not present

## 2020-09-04 DIAGNOSIS — C642 Malignant neoplasm of left kidney, except renal pelvis: Secondary | ICD-10-CM

## 2020-09-04 DIAGNOSIS — R918 Other nonspecific abnormal finding of lung field: Secondary | ICD-10-CM | POA: Diagnosis not present

## 2020-09-04 DIAGNOSIS — Z79899 Other long term (current) drug therapy: Secondary | ICD-10-CM | POA: Diagnosis not present

## 2020-09-04 DIAGNOSIS — Z7901 Long term (current) use of anticoagulants: Secondary | ICD-10-CM | POA: Diagnosis not present

## 2020-09-04 NOTE — Progress Notes (Signed)
Hematology and Oncology Follow Up   Frank [name] [id number] [birth year] 80 y.o. 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     Prior Therapy:  He is status post left radical nephrectomy completed by Dr. Gloriann Loan on October 18, 2018.  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  Current therapy: Active surveillance.  Interim History: Frank [name] returns today for a follow-up visit.  Since the last visit, he reports no major changes in his health.  He denies any recent hospitalizations or illnesses.  Any chest pain shortness of breath.  Denies any flank pain or hematuria.  His performance status quality of life remain excellent.    Medications: Reviewed without changes. Current Outpatient Medications  Medication Sig Dispense Refill   Calcium Carbonate (CALCIUM 500 PO) Take 500 mg by mouth 2 (two) times daily.     denosumab (PROLIA) 60 MG/ML SOSY injection Inject 60 mg into the skin every 6 (six) months.     ELIQUIS 5 MG TABS tablet TAKE 1 TABLET BY MOUTH TWICE DAILY 180 tablet 1   esomeprazole (NEXIUM) 40 MG capsule Take 1 capsule (40 mg total) by mouth daily before breakfast. 90 capsule 3   Multiple Vitamins-Minerals (CENTRUM MULTI + OMEGA 3 PO) Take 1 tablet by mouth daily.      omega-3 acid ethyl esters (LOVAZA) 1 g capsule Take 2 capsules by mouth twice daily 360 capsule 0   rosuvastatin (CRESTOR) 10 MG tablet Take 1 tablet by mouth once daily 90 tablet 1   solifenacin (VESICARE) 10 MG tablet Take 1 tablet by mouth once daily 90 tablet 1   VITAMIN D PO Take 1,000 Units by mouth daily.     No current facility-administered medications for this visit.     Allergies:  Allergies  Allergen Reactions   Hydrocodone Other (See Comments)    Caused confusion     Monosodium Glutamate Other (See Comments)    Caused headaches   Naproxen Sodium     Mouth blisters with prolonged use.  Left nephrectomy 2020   Other     Perfume - headaches    Physical exam:  Blood pressure 113/62, pulse 64, temperature 97.9 F (36.6 C), temperature source Oral, resp. rate 18, height 5\' 4"  (1.626 m), weight 187 lb 8 oz (85 kg), SpO2 97 %.     ECOG 1   General appearance: Alert, awake without any distress. Head: Atraumatic without abnormalities Oropharynx: Without any thrush or ulcers. Eyes: No scleral icterus. Lymph nodes: No lymphadenopathy noted in the cervical, supraclavicular, or axillary nodes Heart:regular rate and rhythm, without any murmurs or gallops.   Lung: Clear to auscultation without any rhonchi, wheezes or dullness to percussion. Abdomin: Soft, nontender without any shifting dullness or ascites. Musculoskeletal: No clubbing or cyanosis. Neurological: No motor or sensory deficits. Skin: No rashes or lesions.         Lab Results: Lab Results  Component Value Date   WBC 6.2 07/21/2020   HGB 13.5 07/21/2020   HCT 37.9 07/21/2020   MCV 94 07/21/2020   PLT 110 (L) 07/21/2020     Chemistry      Component Value Date/Time   NA 135 07/21/2020 1211   K 4.3 07/21/2020 1211   K 3.8 11/09/2013 0000   CL 100 07/21/2020 1211  CL 105 11/09/2013 0000   CO2 21 07/21/2020 1211   CO2 26 11/09/2013 0000   BUN 18 07/21/2020 1211   CREATININE 1.17 07/21/2020 1211   CREATININE 0.66 11/09/2013 0000      Component Value Date/Time   CALCIUM 9.1 07/21/2020 1211   CALCIUM 8.7 11/09/2013 0000   ALKPHOS 34 (L) 01/17/2020 1403   ALKPHOS 55 11/09/2013 0000   AST 24 01/17/2020 1403   AST 18 11/09/2013 0000   ALT 26 01/17/2020 1403   BILITOT 0.5 01/17/2020 1403   BILITOT 0.5 10/12/2017 0729   BILITOT 0.6 11/09/2013 0000        Impression and Plan:  80 year old with:   1.    Kidney cancer diagnosed in 2020.  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   He is currently on active surveillance and did not receive any additional treatment since his surgical resection.  Imaging studies obtained in May 2022 were personally reviewed and showed no evidence of metastatic disease.  Risks and benefits of continued active surveillance versus systemic therapy utilizing Pembrolizumab were reviewed.  At this time, recommended continued active surveillance as it is unclear for the role of Pembrolizumab in this particular setting especially 2 years after surgical resection without any evidence of metastatic disease.  Systemic therapy options will be used if he develops measurable metastatic disease.  He is agreeable and understands this plan.  2.  Pulmonary nodules: Appears to be decreasing in size and benign etiology.  3.  Right kidney mass: Continues to be small and unchanged over period of time.  He is currently being monitored care of Dr. Gloriann Loan with CT scan in 6 months.   4.  Follow-up: In 6 months for repeat follow-up after the next CT scan scheduled under care of Dr. Gloriann Loan.      30  minutes were dedicated to this encounter.  The time was spent on reviewing imaging studies, disease status update, treatment choices and future plan of care discussion.   Zola Button, MD 09/04/2020 1:13 PM

## 2020-09-05 ENCOUNTER — Ambulatory Visit: Payer: Medicare Other | Admitting: Oncology

## 2020-10-07 ENCOUNTER — Other Ambulatory Visit: Payer: Self-pay | Admitting: Internal Medicine

## 2020-10-08 ENCOUNTER — Telehealth: Payer: Self-pay | Admitting: Pharmacist

## 2020-10-08 NOTE — Progress Notes (Signed)
    Chronic Care Management Pharmacy Assistant   Name: [name]  MRN: [id number] DOB: 08/28/1940   Reason for Encounter: Disease State   Conditions to be addressed/monitored: General   Recent office visits:  None ID  Recent consult visits:  09/04/20 Dr. 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   denosumab (PROLIA) 60 MG/ML SOSY injection Inject 60 mg into the skin every 6 (six) months.   ELIQUIS 5 MG TABS tablet TAKE 1 TABLET BY MOUTH TWICE DAILY   esomeprazole (NEXIUM) 40 MG capsule Take 1 capsule (40 mg total) by mouth daily before breakfast.   Multiple Vitamins-Minerals (CENTRUM MULTI + OMEGA 3 PO) Take 1 tablet by mouth daily.    omega-3 acid ethyl esters (LOVAZA) 1 g capsule Take 2 capsules by mouth twice daily   rosuvastatin (CRESTOR) 10 MG tablet Take 1 tablet by mouth once daily   solifenacin (VESICARE) 10 MG tablet Take 1 tablet by mouth once daily   VITAMIN D PO Take 1,000 Units by mouth daily.   No facility-administered encounter medications on file as of 10/08/2020.   Pharmacist Review Have you had any problems recently with your health? Patient states that he is doing much better. He states that he saw his cancer doctor [name] and he told him he is doing great. He also stated that he is getting his strength back and is not taking any pain pills  Have you had any problems with your pharmacy? Patient states that he is not having any issues with getting his medications or the cost of his medications from the pharmacy  What issues or side effects are you having with your medications? Patient states that he is not having any side effects from medications  What would you like me  to pass along to [facility] for them to help you with?  Patient states that he would like to know when his next Prolia shot is suppose to be , and also when will he need to be scheduled for his Bone Density exam will be   What can we do to take care of you better?  Patient states that he is doing well at this time  Star Rating Drugs: Rosuvastatin 10 mg last fill 07/17/20 90 ds  [name] Pharmacist Assistant [phone]   Time spent:40

## 2020-10-15 ENCOUNTER — Ambulatory Visit (INDEPENDENT_AMBULATORY_CARE_PROVIDER_SITE_OTHER): Payer: Medicare Other

## 2020-10-15 DIAGNOSIS — I495 Sick sinus syndrome: Secondary | ICD-10-CM

## 2020-10-15 LAB — CUP PACEART REMOTE DEVICE CHECK
Battery Remaining Longevity: 145 mo
Battery Voltage: 3.02 V
Brady Statistic AP VP Percent: 0.04 %
Brady Statistic AP VS Percent: 43.19 %
Brady Statistic AS VP Percent: 0.01 %
Brady Statistic AS VS Percent: 56.76 %
Brady Statistic RA Percent Paced: 43.31 %
Brady Statistic RV Percent Paced: 0.05 %
Date Time Interrogation Session: 20220816224144
Implantable Lead Implant Date: 20200519
Implantable Lead Implant Date: 20200519
Implantable Lead Location: 753859
Implantable Lead Location: 753860
Implantable Lead Model: 5076
Implantable Lead Model: 5076
Implantable Pulse Generator Implant Date: 20200519
Lead Channel Impedance Value: 304 Ohm
Lead Channel Impedance Value: 323 Ohm
Lead Channel Impedance Value: 475 Ohm
Lead Channel Impedance Value: 513 Ohm
Lead Channel Pacing Threshold Amplitude: 0.5 V
Lead Channel Pacing Threshold Amplitude: 0.625 V
Lead Channel Pacing Threshold Pulse Width: 0.4 ms
Lead Channel Pacing Threshold Pulse Width: 0.4 ms
Lead Channel Sensing Intrinsic Amplitude: 4.125 mV
Lead Channel Sensing Intrinsic Amplitude: 4.125 mV
Lead Channel Sensing Intrinsic Amplitude: 7.25 mV
Lead Channel Sensing Intrinsic Amplitude: 7.25 mV
Lead Channel Setting Pacing Amplitude: 1.5 V
Lead Channel Setting Pacing Amplitude: 2.5 V
Lead Channel Setting Pacing Pulse Width: 0.4 ms
Lead Channel Setting Sensing Sensitivity: 2 mV

## 2020-10-29 ENCOUNTER — Ambulatory Visit: Payer: Medicare Other

## 2020-10-29 ENCOUNTER — Ambulatory Visit (INDEPENDENT_AMBULATORY_CARE_PROVIDER_SITE_OTHER): Payer: Medicare Other | Admitting: Emergency Medicine

## 2020-10-29 ENCOUNTER — Other Ambulatory Visit: Payer: Self-pay

## 2020-10-29 ENCOUNTER — Encounter: Payer: Self-pay | Admitting: Emergency Medicine

## 2020-10-29 VITALS — BP 132/78 | HR 61 | Ht 64.0 in | Wt 187.0 lb

## 2020-10-29 DIAGNOSIS — R4189 Other symptoms and signs involving cognitive functions and awareness: Secondary | ICD-10-CM

## 2020-10-29 DIAGNOSIS — R6889 Other general symptoms and signs: Secondary | ICD-10-CM | POA: Diagnosis not present

## 2020-10-29 DIAGNOSIS — R413 Other amnesia: Secondary | ICD-10-CM

## 2020-10-29 DIAGNOSIS — I700 Atherosclerosis of aorta: Secondary | ICD-10-CM

## 2020-10-29 DIAGNOSIS — G4452 New daily persistent headache (NDPH): Secondary | ICD-10-CM | POA: Diagnosis not present

## 2020-10-29 DIAGNOSIS — M810 Age-related osteoporosis without current pathological fracture: Secondary | ICD-10-CM

## 2020-10-29 MED ORDER — DENOSUMAB 60 MG/ML ~~LOC~~ SOSY
60.0000 mg | PREFILLED_SYRINGE | Freq: Once | SUBCUTANEOUS | Status: AC
  Administered 2020-10-29: 60 mg via SUBCUTANEOUS

## 2020-10-29 NOTE — Progress Notes (Signed)
[name] 80 y.o.   Chief Complaint  Patient presents with   Headache    Pt states he had some pain at his temple, x 2 days.    HISTORY OF PRESENT ILLNESS: This is a 80 y.o. male complaining of fleeting sharp headaches localized to right forehead and lasting 1 to 2 seconds for the past couple days.  Denies visual problems, nausea or vomiting, dizziness, syncope, flulike symptoms, or any other associated symptoms. Also complaining of memory difficulties with forgetfulness and short-term memory deficits.  Concerned his mental capacity is declining. No other complaints or medical concerns today.  Headache  Pertinent negatives include no abdominal pain, coughing, fever, nausea, sore throat or vomiting.    Prior to Admission medications   Medication Sig Start Date End Date Taking? Authorizing Provider  Calcium Carbonate (CALCIUM 500 PO) Take 500 mg by mouth 2 (two) times daily.   Yes [provider]  denosumab (PROLIA) 60 MG/ML SOSY injection Inject 60 mg into the skin every 6 (six) months.   Yes [provider]  ELIQUIS 5 MG TABS tablet TAKE 1 TABLET BY MOUTH TWICE DAILY 05/05/20  Yes Nahser, Wonda Cheng, MD  esomeprazole (NEXIUM) 40 MG capsule Take 1 capsule (40 mg total) by mouth daily before breakfast. 04/16/20  Yes Irene Shipper, MD  Multiple Vitamins-Minerals (CENTRUM MULTI + OMEGA 3 PO) Take 1 tablet by mouth daily.    Yes [provider]  omega-3 acid ethyl esters (LOVAZA) 1 g capsule Take 2 capsules by mouth twice daily 08/04/20  Yes [name], MD  rosuvastatin (CRESTOR) 10 MG tablet Take 1 tablet by mouth once daily 04/30/20  Yes [name], MD  solifenacin (VESICARE) 10 MG tablet Take 1 tablet by mouth once daily 10/07/20  Yes [name], MD  VITAMIN D PO Take 1,000 Units by mouth daily.   Yes [provider]    Allergies  Allergen Reactions   Hydrocodone Other (See Comments)    Caused confusion    Monosodium Glutamate Other  (See Comments)    Caused headaches   Naproxen Sodium     Mouth blisters with prolonged use.  Left nephrectomy 2020   Other     Perfume - headaches     Patient Active Problem List   Diagnosis Date Noted   Grief reaction 05/21/2020   Stage 3b chronic kidney disease ([place]) 01/18/2020   Chronic bilateral low back pain without sciatica 07/18/2019   Renal cell carcinoma of left kidney ([place]) 01/15/2019   Compression fracture of thoracic vertebra ([place]) 12/26/2018   Compression fracture of lumbar spine, non-traumatic ([place]) 12/26/2018   Iron deficiency anemia due to dietary causes 12/07/2018   Age-related osteoporosis with current pathological fracture 12/07/2018   Prediabetes 12/06/2018   Localized osteoporosis with current pathological fracture 12/06/2018   Chronic anticoagulation 08/22/2018   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 08/22/2018   Recurrent ventral incisional hernia 08/22/2018   Sick sinus syndrome ([place]) 07/18/2018   Intractable low back pain 06/23/2018   Lung nodule seen on imaging study 06/23/2018   Abdominal aortic aneurysm (AAA) without rupture ([place]) 06/23/2018   Cervical radiculopathy 06/16/2018   Essential hypertension 05/01/2018   OAB (overactive bladder) 01/06/2016   Erectile dysfunction due to arterial insufficiency 04/23/2015   Hyperlipidemia with target LDL less than 70 11/28/2012   Routine general medical examination at a health care facility 11/28/2012   PAF (paroxysmal atrial fibrillation) ([place]) 04/16/2011  GERD (gastroesophageal reflux disease) 11/10/2010   BPH (benign prostatic hyperplasia) 01/19/2010   HYPERTRIGLYCERIDEMIA 11/22/2007    Past Medical History:  Diagnosis Date   Diverticulosis    Esophageal reflux    Esophageal stricture    HA (headache)    Hearing loss    Hiatal hernia    Hypertriglyceridemia    Hypoglycemia    Obesity, unspecified    PAF (paroxysmal atrial fibrillation) (HCC)    Echo with normal LV  function and mild LVH   Presence of permanent cardiac pacemaker    Renal mass    left   Syncope    presented with atrial fib converted with Diltiazem   Systemic inflammatory response syndrome ([place]) 06/26/2018    Past Surgical History:  Procedure Laterality Date   CYSTOSCOPY WITH URETEROSCOPY AND STENT PLACEMENT Left 08/16/2018   Procedure: CYSTOSCOPY WITH LEFT RETROGRADE PYELOGRAM, BALLOON DILATION LEFT URETER, STENT PLACEMENT;  Surgeon: Lucas Mallow, MD;  Location: [facility];  Service: Urology;  Laterality: Left;   HERNIA REPAIR     INSERT / REPLACE / REMOVE PACEMAKER     LAPAROSCOPIC ASSISTED VENTRAL HERNIA REPAIR N/A 10/18/2018   Procedure: LAPAROSCOPIC ASSISTED VENTRAL WALL HERNIA REPAIR WITH MESH;  Surgeon: Michael Boston, MD;  Location: [facility];  Service: General;  Laterality: N/A;   LAPAROSCOPIC NEPHRECTOMY, HAND ASSISTED Left 10/18/2018   Procedure: LEFT HAND ASSISTED LAPAROSCOPIC RADICAL NEPHRECTOMY WITH ADRENALECTOMY;  Surgeon: Lucas Mallow, MD;  Location: [facility];  Service: Urology;  Laterality: Left;   LOOP RECORDER INSERTION N/A 04/26/2018   Procedure: LOOP RECORDER INSERTION;  Surgeon: Thompson Grayer, MD;  Location: [facility];  Service: Cardiovascular;  Laterality: N/A;   LOOP RECORDER REMOVAL N/A 07/18/2018   Procedure: LOOP RECORDER REMOVAL;  Surgeon: Thompson Grayer, MD;  Location: [facility];  Service: Cardiovascular;  Laterality: N/A;   NASAL SEPTUM SURGERY     PACEMAKER IMPLANT N/A 07/18/2018   Procedure: PACEMAKER IMPLANT;  Surgeon: Thompson Grayer, MD;  Location: [facility] [facility] [facility];  Service: Cardiovascular;  Laterality: N/A;   TONSILLECTOMY     TONSILLECTOMY     UMBILICAL HERNIA REPAIR  1993   WISDOM TOOTH EXTRACTION     about 80 years old    Social History   Socioeconomic History   Marital status: Married    Spouse name: Not on file   Number of children: 1   Years of education: Not on file   Highest education level: Not on file  Occupational  History   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  Tobacco Use   Smoking status: Former    Types: Cigarettes    Quit date: 03/02/1987    Years since quitting: 33.6   Smokeless tobacco: Never  Vaping Use   Vaping Use: Never used  Substance and Sexual Activity   Alcohol use: No    Alcohol/week: 0.0 standard drinks   Drug use: No   Sexual activity: Not Currently  Other Topics Concern   Not on file  Social History Narrative   Not on file   Social Determinants of Health   Financial Resource Strain: Low Risk    Difficulty of Paying Living Expenses: Not hard at all  Food Insecurity: No Food Insecurity   Worried About Charity fundraiser in the Last Year: Never true   [place] in the Last Year: Never true  Transportation Needs: No Transportation Needs   Lack of Transportation (Medical): No  Lack of Transportation (Non-Medical): No  Physical Activity: Sufficiently Active   Days of Exercise per Week: 7 days   Minutes of Exercise per Session: 60 min  Stress: No Stress Concern Present   Feeling of Stress : Not at all  Social Connections: Socially Integrated   Frequency of Communication with Friends and Family: More than three times a week   Frequency of Social Gatherings with Friends and Family: More than three times a week   Attends Religious Services: More than 4 times per year   Active Member of [employer] or Organizations: Yes   Attends Music therapist: More than 4 times per year   Marital Status: Married  Human resources officer Violence: Not on file    Family History  Problem Relation Age of Onset   Emphysema Father    Gallbladder disease Sister    Bipolar disorder Sister    Bipolar disorder Paternal Grandmother    Colon cancer Neg Hx      Review of Systems  Constitutional: Negative.  Negative for chills and fever.  HENT: Negative.  Negative for congestion and sore throat.   Respiratory: Negative.  Negative for cough and shortness of  breath.   Cardiovascular: Negative.  Negative for chest pain and palpitations.  Gastrointestinal:  Negative for abdominal pain, diarrhea, nausea and vomiting.  Genitourinary: Negative.  Negative for dysuria and hematuria.  Skin: Negative.  Negative for rash.  Neurological:  Positive for headaches.  All other systems reviewed and are negative.  Today's Vitals   10/29/20 1032  BP: 132/78  Pulse: 61  SpO2: 94%  Weight: 187 lb (84.8 kg)  Height: 5\' 4"  (1.626 m)   Body mass index is 32.1 kg/m.  Physical Exam Vitals reviewed.  Constitutional:      Appearance: He is well-developed.  HENT:     Head: Normocephalic.  Eyes:     Extraocular Movements: Extraocular movements intact.     Pupils: Pupils are equal, round, and reactive to light.  Cardiovascular:     Rate and Rhythm: Normal rate and regular rhythm.     Pulses: Normal pulses.     Heart sounds: Normal heart sounds.  Pulmonary:     Effort: Pulmonary effort is normal.     Breath sounds: Normal breath sounds.  Musculoskeletal:     Cervical back: Normal range of motion and neck supple.  Skin:    General: Skin is warm and dry.  Neurological:     General: No focal deficit present.     Mental Status: He is alert and oriented to person, place, and time.     Sensory: No sensory deficit.     Motor: No weakness.     Coordination: Coordination normal.     Gait: Gait normal.  Psychiatric:        Mood and Affect: Mood normal.        Behavior: Behavior normal.     ASSESSMENT & PLAN: Brennen was seen today for headache.  Diagnoses and all orders for this visit:  New daily persistent headache -     Ambulatory referral to Neurology  Senile osteoporosis -     denosumab (PROLIA) injection 60 mg  Forgetfulness -     Ambulatory referral to Neurology  Memory deficit -     Ambulatory referral to Neurology  Cognitive decline -     Ambulatory referral to Neurology  Clinically stable.  No red flag signs or symptoms. May  benefit from neurological evaluation, cognitive function in particular.  Follow-up with PCP after neurology evaluation.  Patient Instructions  Health Maintenance After Age 79 After age 70, you are at a higher risk for certain long-term diseases and infections as well as injuries from falls. Falls are a major cause of broken bones and head injuries in people who are older than age 22. Getting regular preventive care can help to keep you healthy and well. Preventive care includes getting regular testing and making lifestyle changes as recommended by your health care provider. Talk with your health care provider about: Which screenings and tests you should have. A screening is a test that checks for a disease when you have no symptoms. A diet and exercise plan that is right for you. What should I know about screenings and tests to prevent falls? Screening and testing are the best ways to find a health problem early. Early diagnosis and treatment give you the best chance of managing medical conditions that are common after age 51. Certain conditions and lifestyle choices may make you more likely to have a fall. Your health care provider may recommend: Regular vision checks. Poor vision and conditions such as cataracts can make you more likely to have a fall. If you wear glasses, make sure to get your prescription updated if your vision changes. Medicine review. Work with your health care provider to regularly review all of the medicines you are taking, including over-the-counter medicines. Ask your health care provider about any side effects that may make you more likely to have a fall. Tell your health care provider if any medicines that you take make you feel dizzy or sleepy. Osteoporosis screening. Osteoporosis is a condition that causes the bones to get weaker. This can make the bones weak and cause them to break more easily. Blood pressure screening. Blood pressure changes and medicines to control blood  pressure can make you feel dizzy. Strength and balance checks. Your health care provider may recommend certain tests to check your strength and balance while standing, walking, or changing positions. Foot health exam. Foot pain and numbness, as well as not wearing proper footwear, can make you more likely to have a fall. Depression screening. You may be more likely to have a fall if you have a fear of falling, feel emotionally low, or feel unable to do activities that you used to do. Alcohol use screening. Using too much alcohol can affect your balance and may make you more likely to have a fall. What actions can I take to lower my risk of falls? General instructions Talk with your health care provider about your risks for falling. Tell your health care provider if: You fall. Be sure to tell your health care provider about all falls, even ones that seem minor. You feel dizzy, sleepy, or off-balance. Take over-the-counter and prescription medicines only as told by your health care provider. These include any supplements. Eat a healthy diet and maintain a healthy weight. A healthy diet includes low-fat dairy products, low-fat (lean) meats, and fiber from whole grains, beans, and lots of fruits and vegetables. Home safety Remove any tripping hazards, such as rugs, cords, and clutter. Install safety equipment such as grab bars in bathrooms and safety rails on stairs. Keep rooms and walkways well-lit. Activity  Follow a regular exercise program to stay fit. This will help you maintain your balance. Ask your health care provider what types of exercise are appropriate for you. If you need a cane or walker, use it as recommended by your health care provider.  Wear supportive shoes that have nonskid soles. Lifestyle Do not drink alcohol if your health care provider tells you not to drink. If you drink alcohol, limit how much you have: 0-1 drink a day for women. 0-2 drinks a day for men. Be aware of  how much alcohol is in your drink. In the [place] one drink equals one typical bottle of beer (12 oz), one-half glass of wine (5 oz), or one shot of hard liquor (1 oz). Do not use any products that contain nicotine or tobacco, such as cigarettes and e-cigarettes. If you need help quitting, ask your health care provider. Summary Having a healthy lifestyle and getting preventive care can help to protect your health and wellness after age 55. Screening and testing are the best way to find a health problem early and help you avoid having a fall. Early diagnosis and treatment give you the best chance for managing medical conditions that are more common for people who are older than age 43. Falls are a major cause of broken bones and head injuries in people who are older than age 64. Take precautions to prevent a fall at home. Work with your health care provider to learn what changes you can make to improve your health and wellness and to prevent falls. This information is not intended to replace advice given to you by your health care provider. Make sure you discuss any questions you have with your health care provider. Document Revised: 04/25/2020 Document Reviewed: 02/01/2020 Elsevier Patient Education  2022 [place], MD [facility]

## 2020-10-29 NOTE — Telephone Encounter (Signed)
Pt received Prolia today 8/31. Next due after 04/27/2021.

## 2020-10-29 NOTE — Patient Instructions (Signed)
Health Maintenance After Age 79 After age 79, you are at a higher risk for certain long-term diseases and infections as well as injuries from falls. Falls are a major cause of broken bones and head injuries in people who are older than age 79. Getting regular preventive care can help to keep you healthy and well. Preventive care includes getting regular testing and making lifestyle changes as recommended by your health care provider. Talk with your health care provider about: Which screenings and tests you should have. A screening is a test that checks for a disease when you have no symptoms. A diet and exercise plan that is right for you. What should I know about screenings and tests to prevent falls? Screening and testing are the best ways to find a health problem early. Early diagnosis and treatment give you the best chance of managing medical conditions that are common after age 79. Certain conditions and lifestyle choices may make you more likely to have a fall. Your health care provider may recommend: Regular vision checks. Poor vision and conditions such as cataracts can make you more likely to have a fall. If you wear glasses, make sure to get your prescription updated if your vision changes. Medicine review. Work with your health care provider to regularly review all of the medicines you are taking, including over-the-counter medicines. Ask your health care provider about any side effects that may make you more likely to have a fall. Tell your health care provider if any medicines that you take make you feel dizzy or sleepy. Osteoporosis screening. Osteoporosis is a condition that causes the bones to get weaker. This can make the bones weak and cause them to break more easily. Blood pressure screening. Blood pressure changes and medicines to control blood pressure can make you feel dizzy. Strength and balance checks. Your health care provider may recommend certain tests to check your strength and  balance while standing, walking, or changing positions. Foot health exam. Foot pain and numbness, as well as not wearing proper footwear, can make you more likely to have a fall. Depression screening. You may be more likely to have a fall if you have a fear of falling, feel emotionally low, or feel unable to do activities that you used to do. Alcohol use screening. Using too much alcohol can affect your balance and may make you more likely to have a fall. What actions can I take to lower my risk of falls? General instructions Talk with your health care provider about your risks for falling. Tell your health care provider if: You fall. Be sure to tell your health care provider about all falls, even ones that seem minor. You feel dizzy, sleepy, or off-balance. Take over-the-counter and prescription medicines only as told by your health care provider. These include any supplements. Eat a healthy diet and maintain a healthy weight. A healthy diet includes low-fat dairy products, low-fat (lean) meats, and fiber from whole grains, beans, and lots of fruits and vegetables. Home safety Remove any tripping hazards, such as rugs, cords, and clutter. Install safety equipment such as grab bars in bathrooms and safety rails on stairs. Keep rooms and walkways well-lit. Activity  Follow a regular exercise program to stay fit. This will help you maintain your balance. Ask your health care provider what types of exercise are appropriate for you. If you need a cane or walker, use it as recommended by your health care provider. Wear supportive shoes that have nonskid soles. Lifestyle Do not   drink alcohol if your health care provider tells you not to drink. If you drink alcohol, limit how much you have: 0-1 drink a day for women. 0-2 drinks a day for men. Be aware of how much alcohol is in your drink. In the [place] one drink equals one typical bottle of beer (12 oz), one-half glass of wine (5 oz), or one shot of  hard liquor (1 oz). Do not use any products that contain nicotine or tobacco, such as cigarettes and e-cigarettes. If you need help quitting, ask your health care provider. Summary Having a healthy lifestyle and getting preventive care can help to protect your health and wellness after age 79. Screening and testing are the best way to find a health problem early and help you avoid having a fall. Early diagnosis and treatment give you the best chance for managing medical conditions that are more common for people who are older than age 79. Falls are a major cause of broken bones and head injuries in people who are older than age 79. Take precautions to prevent a fall at home. Work with your health care provider to learn what changes you can make to improve your health and wellness and to prevent falls. This information is not intended to replace advice given to you by your health care provider. Make sure you discuss any questions you have with your health care provider. Document Revised: 04/25/2020 Document Reviewed: 02/01/2020 Elsevier Patient Education  2022 Elsevier Inc.  

## 2020-10-30 ENCOUNTER — Encounter: Payer: Self-pay | Admitting: Neurology

## 2020-11-04 NOTE — Progress Notes (Signed)
Remote pacemaker transmission.   

## 2020-11-05 ENCOUNTER — Other Ambulatory Visit: Payer: Self-pay | Admitting: Cardiovascular Disease

## 2020-11-05 ENCOUNTER — Other Ambulatory Visit: Payer: Self-pay | Admitting: Internal Medicine

## 2020-11-05 DIAGNOSIS — I480 Paroxysmal atrial fibrillation: Secondary | ICD-10-CM

## 2020-11-05 DIAGNOSIS — E781 Pure hyperglyceridemia: Secondary | ICD-10-CM

## 2020-11-06 NOTE — Telephone Encounter (Signed)
Pt last saw Oda Kilts, [place] on 07/21/20, last labs 07/21/20 Creat 1.17, age 80, weight 84.8kg, based on specified criteria pt is on appropriate dosage of Eliquis 5mg  BID.  Will refill rx.

## 2020-12-04 ENCOUNTER — Ambulatory Visit: Payer: Medicare Other

## 2020-12-05 ENCOUNTER — Ambulatory Visit (INDEPENDENT_AMBULATORY_CARE_PROVIDER_SITE_OTHER): Payer: Medicare Other

## 2020-12-05 ENCOUNTER — Other Ambulatory Visit: Payer: Self-pay

## 2020-12-05 DIAGNOSIS — Z23 Encounter for immunization: Secondary | ICD-10-CM | POA: Diagnosis not present

## 2020-12-05 NOTE — Progress Notes (Signed)
Pt was given high dose flu vacc w/o any complications.

## 2020-12-09 DIAGNOSIS — H2513 Age-related nuclear cataract, bilateral: Secondary | ICD-10-CM | POA: Diagnosis not present

## 2020-12-09 DIAGNOSIS — H524 Presbyopia: Secondary | ICD-10-CM | POA: Diagnosis not present

## 2020-12-09 DIAGNOSIS — H04123 Dry eye syndrome of bilateral lacrimal glands: Secondary | ICD-10-CM | POA: Diagnosis not present

## 2021-01-07 ENCOUNTER — Other Ambulatory Visit: Payer: Self-pay | Admitting: Internal Medicine

## 2021-01-13 DIAGNOSIS — C642 Malignant neoplasm of left kidney, except renal pelvis: Secondary | ICD-10-CM | POA: Diagnosis not present

## 2021-01-13 DIAGNOSIS — J432 Centrilobular emphysema: Secondary | ICD-10-CM | POA: Diagnosis not present

## 2021-01-13 DIAGNOSIS — D49511 Neoplasm of unspecified behavior of right kidney: Secondary | ICD-10-CM | POA: Diagnosis not present

## 2021-01-13 DIAGNOSIS — D735 Infarction of spleen: Secondary | ICD-10-CM | POA: Diagnosis not present

## 2021-01-13 DIAGNOSIS — K769 Liver disease, unspecified: Secondary | ICD-10-CM | POA: Diagnosis not present

## 2021-01-13 DIAGNOSIS — C641 Malignant neoplasm of right kidney, except renal pelvis: Secondary | ICD-10-CM | POA: Diagnosis not present

## 2021-01-13 DIAGNOSIS — K449 Diaphragmatic hernia without obstruction or gangrene: Secondary | ICD-10-CM | POA: Diagnosis not present

## 2021-01-13 DIAGNOSIS — N289 Disorder of kidney and ureter, unspecified: Secondary | ICD-10-CM | POA: Diagnosis not present

## 2021-01-14 ENCOUNTER — Ambulatory Visit (INDEPENDENT_AMBULATORY_CARE_PROVIDER_SITE_OTHER): Payer: Medicare Other

## 2021-01-14 DIAGNOSIS — I495 Sick sinus syndrome: Secondary | ICD-10-CM | POA: Diagnosis not present

## 2021-01-14 LAB — CUP PACEART REMOTE DEVICE CHECK
Battery Remaining Longevity: 143 mo
Battery Voltage: 3.02 V
Brady Statistic AP VP Percent: 0.03 %
Brady Statistic AP VS Percent: 48.32 %
Brady Statistic AS VP Percent: 0.01 %
Brady Statistic AS VS Percent: 51.64 %
Brady Statistic RA Percent Paced: 48.42 %
Brady Statistic RV Percent Paced: 0.04 %
Date Time Interrogation Session: 20221115213546
Implantable Lead Implant Date: 20200519
Implantable Lead Implant Date: 20200519
Implantable Lead Location: 753859
Implantable Lead Location: 753860
Implantable Lead Model: 5076
Implantable Lead Model: 5076
Implantable Pulse Generator Implant Date: 20200519
Lead Channel Impedance Value: 323 Ohm
Lead Channel Impedance Value: 361 Ohm
Lead Channel Impedance Value: 494 Ohm
Lead Channel Impedance Value: 532 Ohm
Lead Channel Pacing Threshold Amplitude: 0.5 V
Lead Channel Pacing Threshold Amplitude: 0.625 V
Lead Channel Pacing Threshold Pulse Width: 0.4 ms
Lead Channel Pacing Threshold Pulse Width: 0.4 ms
Lead Channel Sensing Intrinsic Amplitude: 4.625 mV
Lead Channel Sensing Intrinsic Amplitude: 4.625 mV
Lead Channel Sensing Intrinsic Amplitude: 6.25 mV
Lead Channel Sensing Intrinsic Amplitude: 6.25 mV
Lead Channel Setting Pacing Amplitude: 1.5 V
Lead Channel Setting Pacing Amplitude: 2.5 V
Lead Channel Setting Pacing Pulse Width: 0.4 ms
Lead Channel Setting Sensing Sensitivity: 2 mV

## 2021-01-19 ENCOUNTER — Encounter: Payer: Self-pay | Admitting: Internal Medicine

## 2021-01-19 ENCOUNTER — Other Ambulatory Visit: Payer: Self-pay

## 2021-01-19 ENCOUNTER — Ambulatory Visit (INDEPENDENT_AMBULATORY_CARE_PROVIDER_SITE_OTHER): Payer: Medicare Other | Admitting: Internal Medicine

## 2021-01-19 VITALS — BP 138/80 | HR 60 | Temp 97.9°F | Resp 16 | Wt 184.6 lb

## 2021-01-19 DIAGNOSIS — Z0000 Encounter for general adult medical examination without abnormal findings: Secondary | ICD-10-CM

## 2021-01-19 DIAGNOSIS — E785 Hyperlipidemia, unspecified: Secondary | ICD-10-CM

## 2021-01-19 DIAGNOSIS — I10 Essential (primary) hypertension: Secondary | ICD-10-CM

## 2021-01-19 DIAGNOSIS — D693 Immune thrombocytopenic purpura: Secondary | ICD-10-CM | POA: Insufficient documentation

## 2021-01-19 DIAGNOSIS — N1832 Chronic kidney disease, stage 3b: Secondary | ICD-10-CM

## 2021-01-19 DIAGNOSIS — D696 Thrombocytopenia, unspecified: Secondary | ICD-10-CM | POA: Diagnosis not present

## 2021-01-19 DIAGNOSIS — E781 Pure hyperglyceridemia: Secondary | ICD-10-CM | POA: Diagnosis not present

## 2021-01-19 DIAGNOSIS — R7303 Prediabetes: Secondary | ICD-10-CM | POA: Diagnosis not present

## 2021-01-19 HISTORY — DX: Thrombocytopenia, unspecified: D69.6

## 2021-01-19 LAB — CBC WITH DIFFERENTIAL/PLATELET
Basophils Absolute: 0 10*3/uL (ref 0.0–0.1)
Basophils Relative: 0.4 % (ref 0.0–3.0)
Eosinophils Absolute: 0.1 10*3/uL (ref 0.0–0.7)
Eosinophils Relative: 2 % (ref 0.0–5.0)
HCT: 40.5 % (ref 39.0–52.0)
Hemoglobin: 13.8 g/dL (ref 13.0–17.0)
Lymphocytes Relative: 16.4 % (ref 12.0–46.0)
Lymphs Abs: 1.1 10*3/uL (ref 0.7–4.0)
MCHC: 34.1 g/dL (ref 30.0–36.0)
MCV: 97.4 fl (ref 78.0–100.0)
Monocytes Absolute: 0.5 10*3/uL (ref 0.1–1.0)
Monocytes Relative: 7.7 % (ref 3.0–12.0)
Neutro Abs: 4.8 10*3/uL (ref 1.4–7.7)
Neutrophils Relative %: 73.5 % (ref 43.0–77.0)
Platelets: 118 10*3/uL — ABNORMAL LOW (ref 150.0–400.0)
RBC: 4.16 Mil/uL — ABNORMAL LOW (ref 4.22–5.81)
RDW: 15.1 % (ref 11.5–15.5)
WBC: 6.5 10*3/uL (ref 4.0–10.5)

## 2021-01-19 LAB — VITAMIN B12: Vitamin B-12: 480 pg/mL (ref 211–911)

## 2021-01-19 LAB — HEMOGLOBIN A1C: Hgb A1c MFr Bld: 5.9 % (ref 4.6–6.5)

## 2021-01-19 LAB — FOLATE: Folate: 22.2 ng/mL (ref >5.9)

## 2021-01-19 NOTE — Progress Notes (Signed)
Subjective:  Patient ID: Frank Hoover., male    DOB: 24-Aug-1940  Age: 80 y.o. MRN: [id number]  CC: Annual Exam, Hypertension, and Hyperlipidemia  This visit occurred during the SARS-CoV-2 public health emergency.  Safety protocols were in place, including screening questions prior to the visit, additional usage of staff PPE, and extensive cleaning of exam room while observing appropriate contact time as indicated for disinfecting solutions.    HPI [name] presents for a CPX and f/up -  He is active and denies chest pain, shortness of breath, palpitations, diaphoresis, dizziness, lightheadedness, or edema.  Outpatient Medications Prior to Visit  Medication Sig Dispense Refill   apixaban (ELIQUIS) 5 MG TABS tablet Take 1 tablet by mouth twice daily 180 tablet 1   Calcium Carbonate (CALCIUM 500 PO) Take 500 mg by mouth 2 (two) times daily.     denosumab (PROLIA) 60 MG/ML SOSY injection Inject 60 mg into the skin every 6 (six) months.     esomeprazole (NEXIUM) 40 MG capsule Take 1 capsule (40 mg total) by mouth daily before breakfast. 90 capsule 3   Multiple Vitamins-Minerals (CENTRUM MULTI + OMEGA 3 PO) Take 1 tablet by mouth daily.      omega-3 acid ethyl esters (LOVAZA) 1 g capsule Take 2 capsules by mouth twice daily 360 capsule 0   rosuvastatin (CRESTOR) 10 MG tablet Take 1 tablet by mouth once daily 90 tablet 1   solifenacin (VESICARE) 10 MG tablet Take 1 tablet by mouth once daily 90 tablet 0   VITAMIN D PO Take 1,000 Units by mouth daily.     No facility-administered medications prior to visit.    ROS Review of Systems  Constitutional:  Negative for diaphoresis, fatigue and unexpected weight change.  HENT: Negative.    Eyes: Negative.   Respiratory:  Negative for cough, chest tightness, shortness of breath and wheezing.   Cardiovascular:  Negative for chest pain, palpitations and leg swelling.  Gastrointestinal:  Negative for abdominal pain, constipation and  vomiting.  Endocrine: Negative.   Genitourinary: Negative.  Negative for difficulty urinating.  Musculoskeletal:  Negative for arthralgias and myalgias.  Skin: Negative.  Negative for pallor.  Neurological: Negative.  Negative for dizziness, weakness, light-headedness and numbness.  Hematological:  Negative for adenopathy. Does not bruise/bleed easily.  Psychiatric/Behavioral: Negative.     Objective:  BP 138/80 (BP Location: Left Arm, Patient Position: Sitting, Cuff Size: Large)   Pulse 60   Temp 97.9 F (36.6 C) (Oral)   Resp 16   Wt 184 lb 9.6 oz (83.7 kg)   SpO2 98%   BMI 31.69 kg/m   BP Readings from Last 3 Encounters:  01/19/21 138/80  10/29/20 132/78  09/04/20 113/62    Wt Readings from Last 3 Encounters:  01/19/21 184 lb 9.6 oz (83.7 kg)  10/29/20 187 lb (84.8 kg)  09/04/20 187 lb 8 oz (85 kg)    Physical Exam Vitals reviewed.  HENT:     Nose: Nose normal.     Mouth/Throat:     Mouth: Mucous membranes are moist.  Eyes:     Conjunctiva/sclera: Conjunctivae normal.  Cardiovascular:     Rate and Rhythm: Normal rate and regular rhythm.     Heart sounds: No murmur heard. Pulmonary:     Effort: Pulmonary effort is normal.     Breath sounds: No stridor. No wheezing, rhonchi or rales.  Abdominal:     General: Abdomen is protuberant. Bowel sounds are normal. There  is no distension.     Palpations: Abdomen is soft. There is no hepatomegaly, splenomegaly or mass.     Tenderness: There is no abdominal tenderness. There is no guarding.  Musculoskeletal:        General: Normal range of motion.     Cervical back: Neck supple.     Right lower leg: No edema.     Left lower leg: No edema.  Lymphadenopathy:     Cervical: No cervical adenopathy.  Skin:    General: Skin is warm and dry.     Coloration: Skin is not pale.  Neurological:     General: No focal deficit present.     Mental Status: He is alert.  Psychiatric:        Mood and Affect: Mood normal.         Behavior: Behavior normal.    Lab Results  Component Value Date   WBC 6.5 01/19/2021   HGB 13.8 01/19/2021   HCT 40.5 01/19/2021   PLT 118.0 Repeated and verified X2. (L) 01/19/2021   GLUCOSE 78 01/19/2021   CHOL 117 01/19/2021   TRIG 166.0 (H) 01/19/2021   HDL 32.80 (L) 01/19/2021   LDLDIRECT 59.0 01/17/2020   LDLCALC 51 01/19/2021   ALT 20 01/19/2021   AST 21 01/19/2021   NA 135 01/19/2021   K 3.8 01/19/2021   CL 102 01/19/2021   CREATININE 1.15 01/19/2021   BUN 15 01/19/2021   CO2 24 01/19/2021   TSH 2.21 01/17/2020   PSA 0.10 01/17/2020   INR 1.2 10/11/2018   HGBA1C 5.9 01/19/2021    US BREAST LTD UNI LEFT INC AXILLA  Result Date: 04/26/2019 CLINICAL DATA:  80 year old male presenting with a new palpable area and tenderness of concern in the retroareolar right breast. EXAM: DIGITAL DIAGNOSTIC BILATERAL MAMMOGRAM WITH CAD AND TOMO ULTRASOUND LEFT BREAST COMPARISON:  Previous exam(s). ACR Breast Density Category b: There are scattered areas of fibroglandular density. FINDINGS: Mammogram: Right breast: A skin BB marks the palpable site of concern in the retroareolar right breast. There is flame shaped fibroglandular tissue in the retroareolar breast which is asymmetrically increased compared to the left side. No suspicious solid mass. No microcalcification. Left breast: There is a small amount of breast tissue in the retroareolar left breast. In the upper outer there is a round mass measuring 0.5 cm which persists on spot compression views. Mammographic images were processed with CAD. On physical exam, I palpate firmness in the retroareolar right breast without a fixed discrete mass. There are no skin changes. Ultrasound: Targeted ultrasound is performed in the left breast at 2:30 o'clock 4 cm from the nipple demonstrating an oval circumscribed hypoechoic mass with fatty hilum measuring 0.3 x 0.2 x 0.3 cm, consistent with an intramammary lymph node. IMPRESSION: 1. Asymmetric right  greater than left gynecomastia which explain the patient's palpable area of concern in the retroareolar right breast. No mammographic evidence of malignancy. 2. Left breast mass at 2:30 o'clock measuring 0 point 3 cm consistent with a benign intramammary lymph node. RECOMMENDATION: Clinical follow-up for the right breast gynecomastia. Patient feels this may be related to a recent Prolia shot. I discussed with the patient the fact that gynecomastia can occur in men as testosterone levels decrease with age or in younger men with low testosterone levels, causing a change in the serum testosterone:estrogen ratio. We also discussed other potential etiologies of gynecomastia including numerous prescription medications and recreational drugs (marijuana and anabolic steroids in particular). Approximately 20%  of cases of gynecomastia are idiopathic. I counseled the patient to perform self-examination to make sure that a hard lump does not develop which could indicate malignancy and would need further evaluation. BI-RADS CATEGORY  2: Benign. Electronically Signed   By: Audie Pinto M.D.   On: 04/26/2019 15:03   MM DIAG BREAST TOMO BILATERAL  Result Date: 04/26/2019 CLINICAL DATA:  80 year old male presenting with a new palpable area and tenderness of concern in the retroareolar right breast. EXAM: DIGITAL DIAGNOSTIC BILATERAL MAMMOGRAM WITH CAD AND TOMO ULTRASOUND LEFT BREAST COMPARISON:  Previous exam(s). ACR Breast Density Category b: There are scattered areas of fibroglandular density. FINDINGS: Mammogram: Right breast: A skin BB marks the palpable site of concern in the retroareolar right breast. There is flame shaped fibroglandular tissue in the retroareolar breast which is asymmetrically increased compared to the left side. No suspicious solid mass. No microcalcification. Left breast: There is a small amount of breast tissue in the retroareolar left breast. In the upper outer there is a round mass measuring 0.5  cm which persists on spot compression views. Mammographic images were processed with CAD. On physical exam, I palpate firmness in the retroareolar right breast without a fixed discrete mass. There are no skin changes. Ultrasound: Targeted ultrasound is performed in the left breast at 2:30 o'clock 4 cm from the nipple demonstrating an oval circumscribed hypoechoic mass with fatty hilum measuring 0.3 x 0.2 x 0.3 cm, consistent with an intramammary lymph node. IMPRESSION: 1. Asymmetric right greater than left gynecomastia which explain the patient's palpable area of concern in the retroareolar right breast. No mammographic evidence of malignancy. 2. Left breast mass at 2:30 o'clock measuring 0 point 3 cm consistent with a benign intramammary lymph node. RECOMMENDATION: Clinical follow-up for the right breast gynecomastia. Patient feels this may be related to a recent Prolia shot. I discussed with the patient the fact that gynecomastia can occur in men as testosterone levels decrease with age or in younger men with low testosterone levels, causing a change in the serum testosterone:estrogen ratio. We also discussed other potential etiologies of gynecomastia including numerous prescription medications and recreational drugs (marijuana and anabolic steroids in particular). Approximately 20% of cases of gynecomastia are idiopathic. I counseled the patient to perform self-examination to make sure that a hard lump does not develop which could indicate malignancy and would need further evaluation. BI-RADS CATEGORY  2: Benign. Electronically Signed   By: Audie Pinto M.D.   On: 04/26/2019 15:03    Assessment & Plan:   Rahkeem was seen today for annual exam, hypertension and hyperlipidemia.  Diagnoses and all orders for this visit:  Essential hypertension- His blood pressure is adequately well controlled. -     Hepatic function panel; Future -     Basic metabolic panel; Future -     Basic metabolic panel -      Hepatic function panel  Stage 3b chronic kidney disease ([place])- His renal function is stable.  He will avoid nephrotoxic agents. -     Urinalysis, Routine w reflex microscopic; Future -     Basic metabolic panel; Future -     Basic metabolic panel -     Urinalysis, Routine w reflex microscopic  Prediabetes- Kis A1c is at 5.9%.  Medical therapy is not indicated. -     Hemoglobin A1c; Future -     Basic metabolic panel; Future -     Basic metabolic panel -     Hemoglobin A1c  HYPERTRIGLYCERIDEMIA-  His triglycerides are normal now. -     Lipid panel; Future -     Hepatic function panel; Future -     Hepatic function panel -     Lipid panel  Hyperlipidemia with target LDL less than 70- LDL goal achieved. Doing well on the statin  -     Lipid panel; Future -     Hepatic function panel; Future -     Hepatic function panel -     Lipid panel  Routine general medical examination at a health care facility- Exam completed, labs reviewed, vaccines are up-to-date, no cancer screenings indicated, patient education was given.  Thrombocytopenia ([place])- His platelet count is stable.  There is no history of bleeding or bruising.  B12 and folate are normal.  Will continue to monitor. -     CBC with Differential/Platelet; Future -     Vitamin B12; Future -     Folate; Future -     Folate -     Vitamin B12 -     CBC with Differential/Platelet  I am having Frank Hoover. "[name]" maintain his Calcium Carbonate (CALCIUM 500 PO), VITAMIN D PO, Multiple Vitamins-Minerals (CENTRUM MULTI + OMEGA 3 PO), denosumab, esomeprazole, rosuvastatin, omega-3 acid ethyl esters, Eliquis, and solifenacin.  No orders of the defined types were placed in this encounter.    Follow-up: Return in about 6 months (around 07/19/2021).  Scarlette Calico, MD

## 2021-01-19 NOTE — Patient Instructions (Signed)

## 2021-01-20 LAB — URINALYSIS, ROUTINE W REFLEX MICROSCOPIC
Bilirubin Urine: NEGATIVE
Hgb urine dipstick: NEGATIVE
Ketones, ur: NEGATIVE
Leukocytes,Ua: NEGATIVE
Nitrite: NEGATIVE
Specific Gravity, Urine: 1.01 (ref 1.000–1.030)
Total Protein, Urine: NEGATIVE
Urine Glucose: NEGATIVE
Urobilinogen, UA: 0.2 (ref 0.0–1.0)
pH: 6.5 (ref 5.0–8.0)

## 2021-01-20 LAB — BASIC METABOLIC PANEL
BUN: 15 mg/dL (ref 6–23)
CO2: 24 mEq/L (ref 19–32)
Calcium: 9.2 mg/dL (ref 8.4–10.5)
Chloride: 102 mEq/L (ref 96–112)
Creatinine, Ser: 1.15 mg/dL (ref 0.40–1.50)
GFR: 60.76 mL/min (ref >60.00)
Glucose, Bld: 78 mg/dL (ref 70–99)
Potassium: 3.8 mEq/L (ref 3.5–5.1)
Sodium: 135 mEq/L (ref 135–145)

## 2021-01-20 LAB — LIPID PANEL
Cholesterol: 117 mg/dL (ref 0–200)
HDL: 32.8 mg/dL — ABNORMAL LOW (ref >39.00)
LDL Cholesterol: 51 mg/dL (ref 0–99)
NonHDL: 84.08
Total CHOL/HDL Ratio: 4
Triglycerides: 166 mg/dL — ABNORMAL HIGH (ref 0.0–149.0)
VLDL: 33.2 mg/dL (ref 0.0–40.0)

## 2021-01-20 LAB — HEPATIC FUNCTION PANEL
ALT: 20 U/L (ref 0–53)
AST: 21 U/L (ref 0–37)
Albumin: 4.3 g/dL (ref 3.5–5.2)
Alkaline Phosphatase: 32 U/L — ABNORMAL LOW (ref 39–117)
Bilirubin, Direct: 0.1 mg/dL (ref 0.0–0.3)
Total Bilirubin: 0.6 mg/dL (ref 0.2–1.2)
Total Protein: 6.9 g/dL (ref 6.0–8.3)

## 2021-01-24 ENCOUNTER — Other Ambulatory Visit: Payer: Self-pay | Admitting: Internal Medicine

## 2021-01-24 DIAGNOSIS — E785 Hyperlipidemia, unspecified: Secondary | ICD-10-CM

## 2021-01-26 NOTE — Progress Notes (Addendum)
NEUROLOGY CONSULTATION NOTE  [name] MRN: [id number] DOB: [birth year]  Referring provider: Agustina Caroli, MD Primary care provider: Scarlette Calico, MD  Reason for consult:  headache  Assessment/Plan:   Memory deficits - may be related to underlying depression and anxiety, particularly related to grief, but further evaluation is warranted. Right foot numbness likely sequelae of lumbar radiculopathy - not much to be done as it is not necessarily painful and symptoms would not warrant procedural intervention (epidural, surgery, etc) Possible primary stabbing headache.    1  MRI and MRA of brain to rule out secondary causes of memory deficits and stabbing headaches such as mass lesion or aneurysm 2  Neuropsychological evaluation 3  Further recommendations pending results.   Subjective:  Frank [name] is a 80 year old male with PAF who presents for headaches.  History supplemented by referring provider's note.  He has remote history of migraines.  Over the past couple of years, he has had a different headache off and on.  He describes a paroxysmal sharp pain in the right temple lasting a few seconds at a time.  No associated nausea, vomiting, photophobia, phonophobia, visual disturbance or autonomic symptoms.  They recurred in June 03, 2022 after the death of her daughter.  They would occur almost daily.  However, they resolved about 6 weeks ago.  Also, since 06/03/22, he reports short-term memory problems.  He will forget why he walked into a room or may forget names but will recall it after a couple of minutes.  He reports that his paternal grandmother and sister [name] had cognitive problems.  B12 from 01/19/2021 was 480.  TSH from 01/17/2020 was 2.21.  He has had chronic low back pain since 06/03/2018.  MRI of thoracic and lumbar spine on 06/25/2018 personally reviewed showed evidence of acute compression fracture at T12 and L1 without 50% loss of vertebral body height, as well as endplate  fractures at L3, L4, and L5 without canal or foraminal stenosis.  However, he denied trauma at that time.  Since then, he has had numbness in the toes and ball of right foot but no radicular pain or weakness in the leg.  It is not painful.  Follow up lumbar X-ray on 01/17/2020 showed endplate degenerative changes at L2 as well as facet hypertrophy in the lower lumbar spine.  CT head on 04/24/2018 personally reviewed was unremarkable.   PAST MEDICAL HISTORY: Past Medical History:  Diagnosis Date   Diverticulosis    Esophageal reflux    Esophageal stricture    HA (headache)    Hearing loss    Hiatal hernia    Hypertriglyceridemia    Hypoglycemia    Obesity, unspecified    PAF (paroxysmal atrial fibrillation) (HCC)    Echo with normal LV function and mild LVH   Presence of permanent cardiac pacemaker    Renal mass    left   Syncope    presented with atrial fib converted with Diltiazem   Systemic inflammatory response syndrome ([place]) 06/26/2018    PAST SURGICAL HISTORY: Past Surgical History:  Procedure Laterality Date   CYSTOSCOPY WITH URETEROSCOPY AND STENT PLACEMENT Left 08/16/2018   Procedure: CYSTOSCOPY WITH LEFT RETROGRADE PYELOGRAM, BALLOON DILATION LEFT URETER, STENT PLACEMENT;  Surgeon: Lucas Mallow, MD;  Location: [facility];  Service: Urology;  Laterality: Left;   HERNIA REPAIR     INSERT / REPLACE / REMOVE PACEMAKER     LAPAROSCOPIC ASSISTED VENTRAL HERNIA REPAIR N/A 10/18/2018  Procedure: LAPAROSCOPIC ASSISTED VENTRAL WALL HERNIA REPAIR WITH MESH;  Surgeon: Michael Boston, MD;  Location: [facility];  Service: General;  Laterality: N/A;   LAPAROSCOPIC NEPHRECTOMY, HAND ASSISTED Left 10/18/2018   Procedure: LEFT HAND ASSISTED LAPAROSCOPIC RADICAL NEPHRECTOMY WITH ADRENALECTOMY;  Surgeon: Lucas Mallow, MD;  Location: [facility];  Service: Urology;  Laterality: Left;   LOOP RECORDER INSERTION N/A 04/26/2018   Procedure: LOOP RECORDER INSERTION;  Surgeon: Thompson Grayer, MD;   Location: [facility];  Service: Cardiovascular;  Laterality: N/A;   LOOP RECORDER REMOVAL N/A 07/18/2018   Procedure: LOOP RECORDER REMOVAL;  Surgeon: Thompson Grayer, MD;  Location: [facility];  Service: Cardiovascular;  Laterality: N/A;   NASAL SEPTUM SURGERY     PACEMAKER IMPLANT N/A 07/18/2018   Procedure: PACEMAKER IMPLANT;  Surgeon: Thompson Grayer, MD;  Location: [facility];  Service: Cardiovascular;  Laterality: N/A;   TONSILLECTOMY     TONSILLECTOMY     UMBILICAL HERNIA REPAIR  1993   WISDOM TOOTH EXTRACTION     about 80 years old    MEDICATIONS: Current Outpatient Medications on File Prior to Visit  Medication Sig Dispense Refill   apixaban (ELIQUIS) 5 MG TABS tablet Take 1 tablet by mouth twice daily 180 tablet 1   Calcium Carbonate (CALCIUM 500 PO) Take 500 mg by mouth 2 (two) times daily.     denosumab (PROLIA) 60 MG/ML SOSY injection Inject 60 mg into the skin every 6 (six) months.     esomeprazole (NEXIUM) 40 MG capsule Take 1 capsule (40 mg total) by mouth daily before breakfast. 90 capsule 3   Multiple Vitamins-Minerals (CENTRUM MULTI + OMEGA 3 PO) Take 1 tablet by mouth daily.      omega-3 acid ethyl esters (LOVAZA) 1 g capsule Take 2 capsules by mouth twice daily 360 capsule 0   rosuvastatin (CRESTOR) 10 MG tablet Take 1 tablet by mouth once daily 90 tablet 1   solifenacin (VESICARE) 10 MG tablet Take 1 tablet by mouth once daily 90 tablet 0   VITAMIN D PO Take 1,000 Units by mouth daily.     No current facility-administered medications on file prior to visit.    ALLERGIES: Allergies  Allergen Reactions   Hydrocodone Other (See Comments)    Caused confusion    Monosodium Glutamate Other (See Comments)    Caused headaches   Naproxen Sodium     Mouth blisters with prolonged use.  Left nephrectomy 2020   Other     Perfume - headaches     FAMILY HISTORY: Family History  Problem Relation Age of Onset   Emphysema Father    Gallbladder  disease Sister    Bipolar disorder Sister    Bipolar disorder Paternal Grandmother    Colon cancer Neg Hx     Objective:  Blood pressure 114/63, pulse (!) 58, height 5\' 5"  (1.651 m), weight 186 lb (84.4 kg), SpO2 94 %. General: No acute distress.  Patient appears well-groomed.   Head:  Normocephalic/atraumatic Eyes:  fundi examined but not visualized Neck: supple, no paraspinal tenderness, full range of motion Back: No paraspinal tenderness Heart: regular rate and rhythm Lungs: Clear to auscultation bilaterally. Vascular: No carotid bruits. Neurological Exam: Mental status: alert and oriented to person, place, and time, speech fluent and not dysarthric, language intact. [employer] Mental Exam 01/27/2021  Weekday Correct 1  Current year 1  What state are we in? 1  Amount spent 1  Amount left 0  #  of Animals 3  5 objects recall 2  Number series 1  Hour markers 0  Time correct 2  Placed X in triangle correctly 1  Largest Figure 1  Name of male 2  Date back to work 0  Type of work 0  State she lived in 0  Total score 16   Cranial nerves: CN I: not tested CN II: pupils equal, round and reactive to light, visual fields intact CN III, IV, VI:  full range of motion, no nystagmus, no ptosis CN V: facial sensation intact. CN VII: upper and lower face symmetric CN VIII: hearing intact CN IX, X: gag intact, uvula midline CN XI: sternocleidomastoid and trapezius muscles intact CN XII: tongue midline Bulk & Tone: normal, no fasciculations. Motor:  muscle strength 5/5 throughout Sensation:  Pinprick,and vibratory sensation reduced in toes of right foot. Deep Tendon Reflexes:  2+ throughout,  toes downgoing.   Finger to nose testing:  Without dysmetria.   Heel to shin:  Without dysmetria.   Gait:  Normal station and stride.  Romberg negative.    Thank you for allowing me to take part in the care of this patient.  Metta Clines, DO  CC: Agustina Caroli, MD  Scarlette Calico, MD

## 2021-01-26 NOTE — Progress Notes (Signed)
Remote pacemaker transmission.   

## 2021-01-27 ENCOUNTER — Ambulatory Visit: Payer: Medicare Other | Admitting: Neurology

## 2021-01-27 ENCOUNTER — Encounter: Payer: Self-pay | Admitting: Neurology

## 2021-01-27 ENCOUNTER — Other Ambulatory Visit: Payer: Self-pay

## 2021-01-27 VITALS — BP 114/63 | HR 58 | Ht 65.0 in | Wt 186.0 lb

## 2021-01-27 DIAGNOSIS — M5416 Radiculopathy, lumbar region: Secondary | ICD-10-CM

## 2021-01-27 DIAGNOSIS — R413 Other amnesia: Secondary | ICD-10-CM

## 2021-01-27 DIAGNOSIS — R519 Headache, unspecified: Secondary | ICD-10-CM

## 2021-01-27 NOTE — Patient Instructions (Signed)
MRI and MRA of brain Neurocognitive exam Further recommendations pending results.

## 2021-02-05 ENCOUNTER — Telehealth: Payer: Self-pay

## 2021-02-05 ENCOUNTER — Other Ambulatory Visit: Payer: Self-pay

## 2021-02-05 ENCOUNTER — Telehealth: Payer: Self-pay | Admitting: Neurology

## 2021-02-05 DIAGNOSIS — R413 Other amnesia: Secondary | ICD-10-CM

## 2021-02-05 NOTE — Telephone Encounter (Signed)
Patient wife needs a call about husbands MRI appt. He has to have one at the hospital due to him having a pace maker [phone]

## 2021-02-05 NOTE — Telephone Encounter (Signed)
Orders sent to [facility] for [facility]

## 2021-02-05 NOTE — Telephone Encounter (Signed)
MRI pending, sent to [facility] she will be sending to MRI after approval.

## 2021-02-08 ENCOUNTER — Other Ambulatory Visit: Payer: Self-pay | Admitting: Internal Medicine

## 2021-02-08 DIAGNOSIS — E781 Pure hyperglyceridemia: Secondary | ICD-10-CM

## 2021-02-12 ENCOUNTER — Telehealth: Payer: Self-pay | Admitting: Internal Medicine

## 2021-02-12 NOTE — Telephone Encounter (Addendum)
°  1. Has your device fired? no  2. Is you device beeping? no  3. Are you experiencing draining or swelling at device site? no  4. Are you calling to see if we received your device transmission? no  5. Have you passed out? no  Patient wants to know if it is safe for him to have a MRI done.  He also wants to when was the last time he was in [facility].    Please route to [place]

## 2021-02-12 NOTE — Telephone Encounter (Signed)
PAtient called and made aware his device is MRI compatible. Also, remote transmission was received and AT/AF burden [place] 0%. Appreciative of call.

## 2021-02-16 ENCOUNTER — Telehealth: Payer: Medicare Other

## 2021-02-20 DIAGNOSIS — C642 Malignant neoplasm of left kidney, except renal pelvis: Secondary | ICD-10-CM | POA: Diagnosis not present

## 2021-02-20 DIAGNOSIS — D49511 Neoplasm of unspecified behavior of right kidney: Secondary | ICD-10-CM | POA: Diagnosis not present

## 2021-03-05 ENCOUNTER — Inpatient Hospital Stay: Payer: Medicare Other

## 2021-03-05 ENCOUNTER — Other Ambulatory Visit: Payer: Self-pay

## 2021-03-05 ENCOUNTER — Inpatient Hospital Stay: Payer: Medicare Other | Attending: Oncology | Admitting: Oncology

## 2021-03-05 VITALS — BP 102/75 | HR 69 | Resp 18 | Wt 188.2 lb

## 2021-03-05 DIAGNOSIS — R918 Other nonspecific abnormal finding of lung field: Secondary | ICD-10-CM | POA: Insufficient documentation

## 2021-03-05 DIAGNOSIS — Z7901 Long term (current) use of anticoagulants: Secondary | ICD-10-CM | POA: Diagnosis not present

## 2021-03-05 DIAGNOSIS — Z79899 Other long term (current) drug therapy: Secondary | ICD-10-CM | POA: Diagnosis not present

## 2021-03-05 DIAGNOSIS — C642 Malignant neoplasm of left kidney, except renal pelvis: Secondary | ICD-10-CM | POA: Diagnosis not present

## 2021-03-05 DIAGNOSIS — Z905 Acquired absence of kidney: Secondary | ICD-10-CM | POA: Diagnosis not present

## 2021-03-05 DIAGNOSIS — Z85528 Personal history of other malignant neoplasm of kidney: Secondary | ICD-10-CM | POA: Insufficient documentation

## 2021-03-05 NOTE — Progress Notes (Signed)
Hematology and Oncology Follow Up   Frank [name] [id number] 06/14/1940 81 y.o. 03/05/2021 1:14 PM Frank [name], MDJones, Frank Right, MD      Principle Diagnosis: 81 year old man with [id number] clear-cell renal cell carcinoma diagnosed 2020.  He was found to  adrenal involvement and sarcomatoid features at the time of diagnosis.   Prior Therapy:  He is status post left radical nephrectomy completed by Dr. Gloriann [name] on October 18, 2018.  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  Current therapy: Active surveillance.  Interim History: Frank [name] returns today for a follow-up visit.  Since the last visit, he reports no major changes in his health.  He does report chronic cough but no dyspnea on exertion.  His cough is predominantly nonproductive.  He denies any nausea, vomiting or abdominal pain.  He denies any hospitalizations or illnesses.    Medications: Updated on review. Current Outpatient Medications  Medication Sig Dispense Refill   apixaban (ELIQUIS) 5 MG TABS tablet Take 1 tablet by mouth twice daily 180 tablet 1   Calcium Carbonate (CALCIUM 500 PO) Take 500 mg by mouth 2 (two) times daily.     denosumab (PROLIA) 60 MG/ML SOSY injection Inject 60 mg into the skin every 6 (six) months.     esomeprazole (NEXIUM) 40 MG capsule Take 1 capsule (40 mg total) by mouth daily before breakfast. 90 capsule 3   Multiple Vitamins-Minerals (CENTRUM MULTI + OMEGA 3 PO) Take 1 tablet by mouth daily.      omega-3 acid ethyl esters (LOVAZA) 1 g capsule Take 2 capsules by mouth twice daily 360 capsule 0   rosuvastatin (CRESTOR) 10 MG tablet Take 1 tablet by mouth once daily 90 tablet 1   solifenacin (VESICARE) 10 MG tablet Take 1 tablet by mouth once daily 90 tablet 0   VITAMIN D PO Take 1,000 Units by mouth daily.     No current facility-administered medications for this visit.     Allergies:  Allergies  Allergen Reactions   Hydrocodone Other (See Comments)     Caused confusion    Monosodium Glutamate Other (See Comments)    Caused headaches   Naproxen Sodium     Mouth blisters with prolonged use.  Left nephrectomy 2020   Other     Perfume - headaches    Physical exam:    Blood pressure 102/75, pulse 69, resp. rate 18, weight 188 lb 3 oz (85.4 kg), SpO2 97 %.    ECOG 1   General appearance: Comfortable appearing without any discomfort Head: Normocephalic without any trauma Oropharynx: Mucous membranes are moist and pink without any thrush or ulcers. Eyes: Pupils are equal and round reactive to light. Lymph nodes: No cervical, supraclavicular, inguinal or axillary lymphadenopathy.   Heart:regular rate and rhythm.  S1 and S2 without leg edema. Lung: Clear without any rhonchi or wheezes.  No dullness to percussion. Abdomin: Soft, nontender, nondistended with good bowel sounds.  No hepatosplenomegaly. Musculoskeletal: No joint deformity or effusion.  Full range of motion noted. Neurological: No deficits noted on motor, sensory and deep tendon reflex exam. Skin: No petechial rash or dryness.  Appeared moist.          Lab Results: Lab Results  Component Value Date   WBC 6.5 01/19/2021   HGB 13.8 01/19/2021   HCT 40.5 01/19/2021   MCV 97.4 01/19/2021   PLT 118.0 Repeated and verified X2. (L) 01/19/2021     Chemistry  Component Value Date/Time   NA 135 01/19/2021 1423   NA 135 07/21/2020 1211   K 3.8 01/19/2021 1423   K 3.8 11/09/2013 0000   CL 102 01/19/2021 1423   CL 105 11/09/2013 0000   CO2 24 01/19/2021 1423   CO2 26 11/09/2013 0000   BUN 15 01/19/2021 1423   BUN 18 07/21/2020 1211   CREATININE 1.15 01/19/2021 1423   CREATININE 0.66 11/09/2013 0000      Component Value Date/Time   CALCIUM 9.2 01/19/2021 1423   CALCIUM 8.7 11/09/2013 0000   ALKPHOS 32 (L) 01/19/2021 1423   ALKPHOS 55 11/09/2013 0000   AST 21 01/19/2021 1423   AST 18 11/09/2013 0000   ALT 20 01/19/2021 1423   BILITOT 0.6 01/19/2021 1423    BILITOT 0.5 10/12/2017 0729   BILITOT 0.6 11/09/2013 0000        Impression and Plan:  81 year old with:   1.    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   His disease status was updated at this time and treatment choices were reviewed.  He is currently on active surveillance without any indication for treatment at this time.  Imaging studies obtained in November 2022 under the care of Dr. Gloriann [name] showed no evidence of clear-cut metastatic disease.  I recommended continued active surveillance.  2.  Pulmonary nodules: Continues to be small although slightly increased by 2 mm based on imaging studies in November 2022.  The differential diagnosis including metastatic disease versus benign etiology.  At this time, I recommended continued surveillance given the minimal change noted at this time.  3.  [name] kidney mass: No major changes based on imaging studies.  He continues to have surveillance under the care of Dr. Gloriann [name].   4.  Follow-up: He will return in 6 months for a follow-up visit.      30  minutes were spent on this visit.  The time was dedicated to reviewing imaging studies, reviewing differential diagnosis, management choices and future plan of care discussion.   Frank Button, MD 03/05/2021 1:14 PM

## 2021-03-06 ENCOUNTER — Telehealth: Payer: Self-pay | Admitting: Oncology

## 2021-03-06 NOTE — Telephone Encounter (Signed)
Scheduled per 01/05 los, patient has been called and voicemail.

## 2021-03-19 ENCOUNTER — Telehealth: Payer: Medicare Other

## 2021-03-25 ENCOUNTER — Ambulatory Visit: Payer: Medicare Other | Admitting: Internal Medicine

## 2021-03-25 ENCOUNTER — Encounter: Payer: Self-pay | Admitting: Internal Medicine

## 2021-03-25 VITALS — BP 120/60 | HR 60 | Ht 65.0 in | Wt 185.0 lb

## 2021-03-25 DIAGNOSIS — K219 Gastro-esophageal reflux disease without esophagitis: Secondary | ICD-10-CM

## 2021-03-25 DIAGNOSIS — K5901 Slow transit constipation: Secondary | ICD-10-CM | POA: Diagnosis not present

## 2021-03-25 DIAGNOSIS — K222 Esophageal obstruction: Secondary | ICD-10-CM | POA: Diagnosis not present

## 2021-03-25 MED ORDER — ESOMEPRAZOLE MAGNESIUM 40 MG PO CPDR: 40.0000 mg | DELAYED_RELEASE_CAPSULE | Freq: Every day | ORAL | 3 refills | Status: DC

## 2021-03-25 NOTE — Patient Instructions (Signed)
If you are age 80 or older, your body mass index should be between 23-30. Your Body mass index is 30.79 kg/m. If this is out of the aforementioned range listed, please consider follow up with your Primary Care Provider.  If you are age 41 or younger, your body mass index should be between 19-25. Your Body mass index is 30.79 kg/m. If this is out of the aformentioned range listed, please consider follow up with your Primary Care Provider.   ________________________________________________________  The [facility] providers would like to encourage you to use [facility] to communicate with providers for non-urgent requests or questions.  Due to long hold times on the telephone, sending your provider a message by [facility] may be a faster and more efficient way to get a response.  Please allow 48 business hours for a response.  Please remember that this is for non-urgent requests.  _______________________________________________________  We have sent the following medications to your pharmacy for you to pick up at your convenience:  Nexium  Please follow up in one year

## 2021-03-25 NOTE — Progress Notes (Signed)
HISTORY OF PRESENT ILLNESS:  [name] is a 81 y.o. 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  He presents today for follow-up.  Patient continues on Nexium for his GERD.  On medication symptoms well controlled.  If he misses his medication, he does have significant breakthrough.  He has not had any recurrent dysphagia.  He also has a history of nonadvanced adenomas with last colonoscopy 2018.  He has aged out of surveillance.  Patient tells me that he is doing well on his medication.  No side effects.  He does request refill.  He also tells me that he had some transient constipation which resolved when he increased his water consumption.  GI review of systems is otherwise negative.  I was sorry to hear of the passing of his only child, a daughter, at age 6.  Review of blood work from November 2022 shows unremarkable basic metabolic panel.  Normal liver test.  Hemoglobin 13.8.  REVIEW OF SYSTEMS:  All non-GI ROS negative unless otherwise stated in the HPI except for hearing problems, sinus and allergies, cough  Past Medical History:  Diagnosis Date   Diverticulosis    Esophageal reflux    Esophageal stricture    HA (headache)    Hearing loss    Hiatal hernia    Hypertriglyceridemia    Hypoglycemia    Obesity, unspecified    PAF (paroxysmal atrial fibrillation) (HCC)    Echo with normal LV function and mild LVH   Presence of permanent cardiac pacemaker    Renal mass    left   Syncope    presented with atrial fib converted with Diltiazem   Systemic inflammatory response syndrome ([place]) 06/26/2018    Past Surgical History:  Procedure Laterality Date   CYSTOSCOPY WITH URETEROSCOPY AND STENT PLACEMENT Left 08/16/2018   Procedure: CYSTOSCOPY WITH LEFT RETROGRADE PYELOGRAM, BALLOON DILATION LEFT URETER, STENT PLACEMENT;  Surgeon: Lucas Mallow, MD;  Location: [facility];  Service: Urology;  Laterality: Left;   HERNIA REPAIR     INSERT / REPLACE / REMOVE PACEMAKER      LAPAROSCOPIC ASSISTED VENTRAL HERNIA REPAIR N/A 10/18/2018   Procedure: LAPAROSCOPIC ASSISTED VENTRAL WALL HERNIA REPAIR WITH MESH;  Surgeon: Michael Boston, MD;  Location: [facility];  Service: General;  Laterality: N/A;   LAPAROSCOPIC NEPHRECTOMY, HAND ASSISTED Left 10/18/2018   Procedure: LEFT HAND ASSISTED LAPAROSCOPIC RADICAL NEPHRECTOMY WITH ADRENALECTOMY;  Surgeon: Lucas Mallow, MD;  Location: [facility];  Service: Urology;  Laterality: Left;   LOOP RECORDER INSERTION N/A 04/26/2018   Procedure: LOOP RECORDER INSERTION;  Surgeon: Thompson Grayer, MD;  Location: [place] [facility];  Service: Cardiovascular;  Laterality: N/A;   LOOP RECORDER REMOVAL N/A 07/18/2018   Procedure: LOOP RECORDER REMOVAL;  Surgeon: Thompson Grayer, MD;  Location: [facility];  Service: Cardiovascular;  Laterality: N/A;   NASAL SEPTUM SURGERY     PACEMAKER IMPLANT N/A 07/18/2018   Procedure: PACEMAKER IMPLANT;  Surgeon: Thompson Grayer, MD;  Location: [facility];  Service: Cardiovascular;  Laterality: N/A;   TONSILLECTOMY     TONSILLECTOMY     UMBILICAL HERNIA REPAIR  1993   WISDOM TOOTH EXTRACTION     about 81 years old    Social History Nikoloz Huy.  reports that he quit smoking about 34 years ago. His smoking use included cigarettes. He has never used smokeless tobacco. He reports that he does not drink alcohol and does not use drugs.  family history includes Bipolar disorder in his paternal grandmother and sister; Emphysema in his father; Gallbladder disease in his sister.  Allergies  Allergen Reactions   Hydrocodone Other (See Comments)    Caused confusion    Monosodium Glutamate Other (See Comments)    Caused headaches   Naproxen Sodium     Mouth blisters with prolonged use.  Left nephrectomy 2020   Other     Perfume - headaches        PHYSICAL EXAMINATION: Vital signs: BP 120/60    Pulse 60    Ht 5\' 5"  (1.651 m)    Wt 185 lb (83.9 kg)    BMI 30.79 kg/m   Constitutional:  generally well-appearing, no acute distress Psychiatric: alert and oriented x3, cooperative Eyes: extraocular movements intact, anicteric, conjunctiva pink Mouth: oral pharynx moist, no lesions Neck: supple no lymphadenopathy Cardiovascular: heart regular rate and rhythm, no murmur Lungs: clear to auscultation bilaterally Abdomen: soft, nontender, nondistended, no obvious ascites, no peritoneal signs, normal bowel sounds, no organomegaly Rectal: Omitted Extremities: no clubbing, cyanosis, or lower extremity edema bilaterally Skin: no lesions on visible extremities Neuro: No focal deficits.  Cranial nerves intact  ASSESSMENT:  1.  Chronic GERD.  Symptoms controlled PPI 2.  History of peptic stricture.  Asymptomatic post dilation on PPI 3.  Transient constipation.  Improved with increased water consumption 4.  History of adenomatous colon polyps.  Last colonoscopy 2018.  Aged out of surveillance 5.  Multiple comorbidities   PLAN:  1.  Reflux precautions 2.  Refill Nexium 40 mg daily 3.  Continue with increased water consumption.  Add fiber 4.  Routine office follow-up 1 year.  Contact the office in the interim for any questions or problems.  He agrees

## 2021-03-27 ENCOUNTER — Ambulatory Visit: Payer: Medicare Other

## 2021-03-27 ENCOUNTER — Other Ambulatory Visit: Payer: Self-pay

## 2021-03-30 ENCOUNTER — Ambulatory Visit (INDEPENDENT_AMBULATORY_CARE_PROVIDER_SITE_OTHER): Payer: Medicare Other

## 2021-03-30 DIAGNOSIS — E785 Hyperlipidemia, unspecified: Secondary | ICD-10-CM

## 2021-03-30 DIAGNOSIS — I480 Paroxysmal atrial fibrillation: Secondary | ICD-10-CM

## 2021-03-30 DIAGNOSIS — I10 Essential (primary) hypertension: Secondary | ICD-10-CM

## 2021-03-30 NOTE — Patient Instructions (Signed)
Visit Information  Following are the goals we discussed today:   Manage My Medicine   Timeframe:  Long-Range Goal Priority:  High Start Date:   03/30/2021                          Expected End Date: 03/30/2022                     Follow Up Date 11/28/2021   - call for medicine refill 2 or 3 days before it runs out - call if I am sick and can't take my medicine - keep a list of all the medicines I take; vitamins and herbals too - learn to read medicine labels - use a pillbox to sort medicine    Why is this important?   These steps will help you keep on track with your medicines.  Plan: Telephone follow up appointment with care management team member scheduled for:  6-8 months  The patient has been provided with contact information for the care management team and has been advised to call with any health related questions or concerns.   Tomasa Blase, PharmD Clinical Pharmacist, [facility]   Please call the care guide team at [phone] if you need to cancel or reschedule your appointment.   Patient verbalizes understanding of instructions and care plan provided today and agrees to view in [place]. Active MyChart status confirmed with patient.

## 2021-03-30 NOTE — Progress Notes (Signed)
Chronic Care Management Pharmacy Note  03/30/2021 Name:  [name] MRN:  [id number] DOB:  01-19-1941  Summary: -Patient reports compliance to medications, denies any current issues or concerns with medications   Recommendations/Changes made from today's visit: -Recommending no changes at this time, patient to reach out with any issues or concerns   Plan: F/u in 6-8 months   Subjective: Frank Hantz. is an 81 y.o. year old male who is a primary patient of [name], MD.  The CCM team was consulted for assistance with disease management and care coordination needs.    Engaged with patient by telephone for follow up visit in response to provider referral for pharmacy case management and/or care coordination services.   Consent to Services:  The patient was given the following information about Chronic Care Management services today, agreed to services, and gave verbal consent: 1. CCM service includes personalized support from designated clinical staff supervised by the primary care provider, including individualized plan of care and coordination with other care providers 2. 24/7 contact phone numbers for assistance for urgent and routine care needs. 3. Service will only be billed when office clinical staff spend 20 minutes or more in a month to coordinate care. 4. Only one practitioner may furnish and bill the service in a calendar month. 5.The patient may stop CCM services at any time (effective at the end of the month) by phone call to the office staff. 6. The patient will be responsible for cost sharing (co-pay) of up to 20% of the service fee (after annual deductible is met). Patient agreed to services and consent obtained.  Patient Care Team: [name], MD as PCP - General Nahser, Wonda Cheng, MD as PCP - Cardiology (Cardiology) Thompson Grayer, MD as PCP - Electrophysiology (Cardiology) Irene Shipper, MD as Consulting Physician (Gastroenterology) Michael Boston,  MD as Consulting Physician (General Surgery) Thompson Grayer, MD as Consulting Physician (Cardiology) Lucas Mallow, MD as Consulting Physician (Urology) Wyatt Portela, MD as Consulting Physician (Oncology) Tomasa Blase, [facility] (Pharmacist)  Recent office visits: 01/19/2021 - [name] - no changes to medications - f/u in 6 months  10/29/2020 - Dr. Mitchel Honour - no changes to medications - referral to neurology for HA and cognitive decline   Recent consult visits: 03/25/2021 - Dr. Henrene Pastor Gertie Fey - follow up GERD - no changes to medications - follow up in 1 year  03/05/2021 - Dr. [name] - Oncology - continue active surveillance - no changes to medications - follow up in 6 months 01/27/2021 - Dr. Tomi Likens - Neurology - MRI and MRA of brain ordered  - neurocognitive exam - no changes to medications   Hospital visits: None in previous 6 months  Objective:  Lab Results  Component Value Date   CREATININE 1.15 01/19/2021   BUN 15 01/19/2021   GFR 60.76 01/19/2021   GFRNONAA >60 10/20/2018   GFRAA >60 10/20/2018   NA 135 01/19/2021   K 3.8 01/19/2021   CALCIUM 9.2 01/19/2021   CO2 24 01/19/2021   GLUCOSE 78 01/19/2021    Lab Results  Component Value Date/Time   HGBA1C 5.9 01/19/2021 02:23 PM   HGBA1C 5.7 01/17/2020 02:03 PM   GFR 60.76 01/19/2021 02:23 PM   GFR 50.48 (L) 01/17/2020 02:03 PM    Last diabetic Eye exam:  No results found for: HMDIABEYEEXA  Last diabetic Foot exam:  No results found for: HMDIABFOOTEX   Lab Results  Component Value Date   CHOL 117 01/19/2021   HDL 32.80 (L) 01/19/2021   LDLCALC 51 01/19/2021   LDLDIRECT 59.0 01/17/2020   TRIG 166.0 (H) 01/19/2021   CHOLHDL 4 01/19/2021    Hepatic Function Latest Ref Rng & Units 01/19/2021 01/17/2020 06/27/2018  Total Protein 6.0 - 8.3 g/dL 6.9 6.8 5.4(L)  Albumin 3.5 - 5.2 g/dL 4.3 4.3 2.7(L)  AST 0 - 37 U/L _0 ALT 0 - 53 U/L _1 Alk Phosphatase 39 - 117 U/L 32(L) 34(L) 45  Total Bilirubin 0.2  - 1.2 mg/dL 0.6 0.5 1.0  Bilirubin, Direct 0.0 - 0.3 mg/dL 0.1 0.1 -    Lab Results  Component Value Date/Time   TSH 2.21 01/17/2020 02:03 PM   TSH 2.01 01/11/2018 09:52 AM    CBC Latest Ref Rng & Units 01/19/2021 07/21/2020 01/17/2020  WBC 4.0 - 10.5 K/uL 6.5 6.2 7.3  Hemoglobin 13.0 - 17.0 g/dL 13.8 13.5 13.9  Hematocrit 39.0 - 52.0 % 40.5 37.9 40.7  Platelets 150.0 - 400.0 K/uL 118.0 Repeated and verified X2.(L) 110(L) 120.0(L)    Lab Results  Component Value Date/Time   VD25OH 36.73 12/06/2018 02:30 PM    Clinical ASCVD: No  The ASCVD Risk score (Arnett DK, et al., 2019) failed to calculate for the following reasons:   The valid total cholesterol range is 130 to 320 mg/dL    Depression screen [facility] 2/9 02/15/2020 01/17/2020 01/15/2019  Decreased Interest 0 0 1  Down, Depressed, Hopeless 0 0 1  PHQ - 2 Score 0 0 2  Altered sleeping - - 1  Tired, decreased energy - - 1  Change in appetite - - 1  Feeling bad or failure about yourself  - - 1  Trouble concentrating - - 1  Moving slowly or fidgety/restless - - 1  Suicidal thoughts - - 0  PHQ-9 Score - - 8  Difficult doing work/chores - - Somewhat difficult    Social History   Tobacco Use  Smoking Status Former   Types: Cigarettes   Quit date: 03/02/1987   Years since quitting: 34.1  Smokeless Tobacco Never   BP Readings from Last 3 Encounters:  03/25/21 120/60  03/05/21 102/75  01/27/21 114/63   Pulse Readings from Last 3 Encounters:  03/25/21 60  03/05/21 69  01/27/21 (!) 58   Wt Readings from Last 3 Encounters:  03/25/21 185 lb (83.9 kg)  03/05/21 188 lb 3 oz (85.4 kg)  01/27/21 186 lb (84.4 kg)   BMI Readings from Last 3 Encounters:  03/25/21 30.79 kg/m  03/05/21 31.32 kg/m  01/27/21 30.95 kg/m    Assessment/Interventions: Review of patient past medical history, allergies, medications, health status, including review of consultants reports, laboratory and other test data, was performed as part of  comprehensive evaluation and provision of chronic care management services.   SDOH:  (Social Determinants of Health) assessments and interventions performed: Yes  SDOH Screenings   Alcohol Screen: Not on file  Depression (PHQ2-9): Not on file  Financial Resource Strain: Not on file  Food Insecurity: Not on file  Housing: Not on file  Physical Activity: Not on file  Social Connections: Not on file  Stress: Not on file  Tobacco Use: Medium Risk   Smoking Tobacco Use: Former   Smokeless Tobacco Use: Never   Passive Exposure: Not on file  Transportation Needs: Not on file    [place]  Allergies  Allergen Reactions   Hydrocodone  Other (See Comments)    Caused confusion    Monosodium Glutamate Other (See Comments)    Caused headaches   Naproxen Sodium     Mouth blisters with prolonged use.  Left nephrectomy 2020   Other     Perfume - headaches     Medications Reviewed Today     Reviewed by Tomasa Blase, [facility] (Pharmacist) on 03/30/21 at 1139  Med List Status: <None>   Medication Order Taking? Sig Documenting Provider Last Dose Status Informant  apixaban (ELIQUIS) 5 MG TABS tablet [id number] Yes Take 1 tablet by mouth twice daily Nahser, Wonda Cheng, MD Taking Active   Calcium Carbonate (CALCIUM 500 PO) [id number] Yes Take 500 mg by mouth 2 (two) times daily. [provider] Taking Active   denosumab (PROLIA) 60 MG/ML SOSY injection [id number]  Inject 60 mg into the skin every 6 (six) months. [provider]  Active   esomeprazole (NEXIUM) 40 MG capsule [id number] Yes Take 1 capsule (40 mg total) by mouth daily before breakfast. Irene Shipper, MD Taking Active   Multiple Vitamins-Minerals (CENTRUM MULTI + OMEGA 3 PO) [id number] Yes Take 1 tablet by mouth daily.  [provider] Taking Active   omega-3 acid ethyl esters (LOVAZA) 1 g capsule [id number] Yes Take 2 capsules by mouth twice daily [name], MD Taking Active   rosuvastatin (CRESTOR) 10  MG tablet [id number] Yes Take 1 tablet by mouth once daily [name], MD Taking Active   sildenafil (VIAGRA) 100 MG tablet [id number] Yes Take 100 mg by mouth daily as needed for erectile dysfunction. [provider] Taking Active   solifenacin (VESICARE) 10 MG tablet [id number] Yes Take 1 tablet by mouth once daily [name], MD Taking Active   VITAMIN D PO [id number] Yes Take 1,000 Units by mouth daily. [provider] Taking Active             Patient Active Problem List   Diagnosis Date Noted   Thrombocytopenia ([place]) 01/19/2021   Stage 3b chronic kidney disease ([place]) 01/18/2020   Chronic bilateral low back pain without sciatica 07/18/2019   Renal cell carcinoma of left kidney ([place]) 01/15/2019   Compression fracture of thoracic vertebra ([place]) 12/26/2018   Compression fracture of lumbar spine, non-traumatic ([place]) 12/26/2018   Iron deficiency anemia due to dietary causes 12/07/2018   Age-related osteoporosis with current pathological fracture 12/07/2018   Prediabetes 12/06/2018   Localized osteoporosis with current pathological fracture 12/06/2018   Chronic anticoagulation 08/22/2018   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 08/22/2018   Recurrent ventral incisional hernia 08/22/2018   Sick sinus syndrome ([place]) 07/18/2018   Intractable low back pain 06/23/2018   Lung nodule seen on imaging study 06/23/2018   Abdominal aortic aneurysm (AAA) without rupture 06/23/2018   Cervical radiculopathy 06/16/2018   Essential hypertension 05/01/2018   OAB (overactive bladder) 01/06/2016   Erectile dysfunction due to arterial insufficiency 04/23/2015   Hyperlipidemia with target LDL less than 70 11/28/2012   Routine general medical examination at a health care facility 11/28/2012   PAF (paroxysmal atrial fibrillation) ([place]) 04/16/2011   GERD (gastroesophageal reflux disease) 11/10/2010   BPH (benign prostatic hyperplasia) 01/19/2010    HYPERTRIGLYCERIDEMIA 11/22/2007    Immunization History  Administered Date(s) Administered   Fluad Quad(high Dose 65+) 12/06/2018, 01/17/2020, 12/05/2020   Influenza Whole 01/19/2010   Influenza, High Dose Seasonal PF 01/06/2016, 01/10/2017, 01/11/2018   Influenza,inj,Quad PF,6+ Mos 11/28/2012, 12/03/2013, 12/25/2014   PFIZER(Purple  Top)SARS-COV-2 Vaccination 04/15/2019, 05/08/2019, 02/01/2020   Pneumococcal Conjugate-13 11/28/2012   Pneumococcal Polysaccharide-23 12/03/2013, 01/17/2020   Td 03/01/2006   Tdap 04/07/2016   Zoster Recombinat (Shingrix) 10/18/2017, 05/09/2018   Zoster, Live 03/30/2013    Conditions to be addressed/monitored:  Hyperlipidemia and Atrial Fibrillation  Care Plan : [place]  Updates made by Tomasa Blase, RPH since 03/30/2021 12:00 AM     Problem: Afib, HLD, Osteoporosis   Priority: High  Onset Date: 03/30/2021     Long-Range Goal: Disease Management   Start Date: 03/30/2021  Expected End Date: 03/30/2022  This Visit's Progress: On track  Priority: High  Note:   Current Barriers:  Chronic Disease Management support, education, and care coordination needs related to Hypertension, Hyperlipidemia, Atrial Fibrillation, and Osteoporosis   Atrial Fibrillation / Hypertension Controlled  BP Readings from Last 3 Encounters:  03/25/21 120/60  03/05/21 102/75  01/27/21 114/63   Pulse Readings from Last 3 Encounters:  03/25/21 60  03/05/21 69  01/27/21 (!) 58  Pharmacist Clinical Goal(s): Patient will work with PharmD and providers to maintain BP goal <130/80 and HR goal < 90 Current regimen:  Eliquis 5 mg twice a day Interventions: Discussed BP and HR goals and benefits of medications for prevention of heart attack / stroke Discussed importance of adherence with Eliquis to prevent stroke; discussed bleeding risk with NSAIDs Patient self care activities - Patient will: Check BP as needed, document, and provide at future  appointment  Hyperlipidemia Controlled  Lab Results  Component Value Date   [facility] 51 01/19/2021  Pharmacist Clinical Goal(s): Patient will work with PharmD and providers to maintain LDL goal < 70 Current regimen:  Rosuvastatin 10 mg daily Lovaza 1g - 2 capsules twice a day Interventions: Discussed cholesterol goals and benefits of medications for prevention of heart attack / stroke Patient self care activities - Patient will: Continue current medications  Osteoporisis Pharmacist Clinical Goal(s) Patient will work with PharmD and providers to optimize therapy Current regimen:  Prolia 60 mg q6 months (last given 10/29/2020) Calcium carbonate 500 mg twice a day Vitamin D 1000 IU daily Interventions: Discussed benefits of Prolia for bone health; pt is due for next injection after 04/04/2020 Patient self care activities - Patient will: Call office to schedule Prolia injections every 6 months  Medication management Pharmacist Clinical Goal(s): Patient will work with PharmD and providers to maintain optimal medication adherence Current pharmacy: [facility] Interventions Comprehensive medication review performed. Continue current medication management strategy Patient self care activities - Patient will: Focus on medication adherence by pill box Take medications as prescribed Report any questions or concerns to PharmD and/or provider(s)       Medication Assistance: None required.  Patient affirms current coverage meets needs.  Care Gaps: Hep C Screening  COVID booster   Patient's preferred pharmacy is:  [facility] [address] [place] [address] Phone: [phone] Fax: [place], [place] - [address] [place]  [address] Phone: [phone] Fax: [place] [address] [place] [address] Phone:  [phone] Fax: [place] [phone] [place], [place] - [place] [address] Phone: [phone] Fax: [phone]   Uses pill box? Yes Pt endorses 100% compliance  Care Plan and Follow Up Patient Decision:  Patient  agrees to Care Plan and Follow-up.  Plan: Telephone follow up appointment with care management team member scheduled for:  6-8 months  The patient has been provided with contact information for the care management team and has been advised to call with any health related questions or concerns.   Tomasa Blase, PharmD Clinical Pharmacist, [place]

## 2021-03-31 DIAGNOSIS — I10 Essential (primary) hypertension: Secondary | ICD-10-CM

## 2021-03-31 DIAGNOSIS — E785 Hyperlipidemia, unspecified: Secondary | ICD-10-CM

## 2021-03-31 DIAGNOSIS — I480 Paroxysmal atrial fibrillation: Secondary | ICD-10-CM

## 2021-04-01 ENCOUNTER — Telehealth: Payer: Medicare Other

## 2021-04-08 ENCOUNTER — Ambulatory Visit: Payer: Medicare Other

## 2021-04-08 ENCOUNTER — Ambulatory Visit (HOSPITAL_COMMUNITY): Payer: Medicare Other

## 2021-04-08 ENCOUNTER — Other Ambulatory Visit: Payer: Self-pay | Admitting: Internal Medicine

## 2021-04-08 ENCOUNTER — Other Ambulatory Visit (HOSPITAL_COMMUNITY): Payer: Medicare Other

## 2021-04-08 ENCOUNTER — Telehealth: Payer: Self-pay

## 2021-04-08 ENCOUNTER — Other Ambulatory Visit: Payer: Self-pay

## 2021-04-08 NOTE — Telephone Encounter (Signed)
Patient did not return call.

## 2021-04-14 ENCOUNTER — Encounter: Payer: Self-pay | Admitting: Cardiovascular Disease
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
## 2021-04-15 ENCOUNTER — Ambulatory Visit (INDEPENDENT_AMBULATORY_CARE_PROVIDER_SITE_OTHER): Payer: Medicare Other

## 2021-04-15 ENCOUNTER — Other Ambulatory Visit: Payer: Self-pay

## 2021-04-15 ENCOUNTER — Ambulatory Visit: Payer: Medicare Other | Admitting: Cardiovascular Disease

## 2021-04-15 ENCOUNTER — Encounter: Payer: Self-pay | Admitting: Cardiovascular Disease

## 2021-04-15 VITALS — BP 120/68 | HR 61 | Ht 65.0 in | Wt 185.8 lb

## 2021-04-15 DIAGNOSIS — Z0000 Encounter for general adult medical examination without abnormal findings: Secondary | ICD-10-CM

## 2021-04-15 DIAGNOSIS — I480 Paroxysmal atrial fibrillation: Secondary | ICD-10-CM

## 2021-04-15 DIAGNOSIS — I495 Sick sinus syndrome: Secondary | ICD-10-CM

## 2021-04-15 DIAGNOSIS — I10 Essential (primary) hypertension: Secondary | ICD-10-CM | POA: Diagnosis not present

## 2021-04-15 LAB — CUP PACEART REMOTE DEVICE CHECK
Battery Remaining Longevity: 140 mo
Battery Voltage: 3.02 V
Brady Statistic AP VP Percent: 0.03 %
Brady Statistic AP VS Percent: 50.84 %
Brady Statistic AS VP Percent: 0.01 %
Brady Statistic AS VS Percent: 49.12 %
Brady Statistic RA Percent Paced: 50.95 %
Brady Statistic RV Percent Paced: 0.04 %
Date Time Interrogation Session: 20230215000302
Implantable Lead Implant Date: 20200519
Implantable Lead Implant Date: 20200519
Implantable Lead Location: 753859
Implantable Lead Location: 753860
Implantable Lead Model: 5076
Implantable Lead Model: 5076
Implantable Pulse Generator Implant Date: 20200519
Lead Channel Impedance Value: 304 Ohm
Lead Channel Impedance Value: 380 Ohm
Lead Channel Impedance Value: 494 Ohm
Lead Channel Impedance Value: 551 Ohm
Lead Channel Pacing Threshold Amplitude: 0.5 V
Lead Channel Pacing Threshold Amplitude: 0.5 V
Lead Channel Pacing Threshold Pulse Width: 0.4 ms
Lead Channel Pacing Threshold Pulse Width: 0.4 ms
Lead Channel Sensing Intrinsic Amplitude: 5.375 mV
Lead Channel Sensing Intrinsic Amplitude: 5.375 mV
Lead Channel Sensing Intrinsic Amplitude: 5.75 mV
Lead Channel Sensing Intrinsic Amplitude: 5.75 mV
Lead Channel Setting Pacing Amplitude: 1.5 V
Lead Channel Setting Pacing Amplitude: 2.5 V
Lead Channel Setting Pacing Pulse Width: 0.4 ms
Lead Channel Setting Sensing Sensitivity: 2 mV

## 2021-04-15 NOTE — Patient Instructions (Addendum)
[name] , Thank you for taking time to come for your Medicare Wellness Visit. I appreciate your ongoing commitment to your health goals. Please review the following plan we discussed and let me know if I can assist you in the future.   Screening recommendations/referrals: Colonoscopy: Done 08/09/16 repeat every 5 years  Recommended yearly ophthalmology/optometry visit for glaucoma screening and checkup Recommended yearly dental visit for hygiene and checkup  Vaccinations: Influenza vaccine: Done 12/05/20 repeat every year  Pneumococcal vaccine: Up to date Tdap vaccine: Done 04/07/16 repeat every 10 years Shingles vaccine: Completed 10/18/17 & 05/09/18   Covid-19: Completed 2/14, 3/9, & 02/01/20  Advanced directives: Please bring a copy of your health care power of attorney and living will to the office at your convenience.  Conditions/risks identified: none at this time   Next appointment: Follow up in one year for your annual wellness visit.   Preventive Care 80 Years and Older, Male Preventive care refers to lifestyle choices and visits with your health care provider that can promote health and wellness. What does preventive care include? A yearly physical exam. This is also called an annual well check. Dental exams once or twice a year. Routine eye exams. Ask your health care provider how often you should have your eyes checked. Personal lifestyle choices, including: Daily care of your teeth and gums. Regular physical activity. Eating a healthy diet. Avoiding tobacco and drug use. Limiting alcohol use. Practicing safe sex. Taking low doses of aspirin every day. Taking vitamin and mineral supplements as recommended by your health care provider. What happens during an annual well check? The services and screenings done by your health care provider during your annual well check will depend on your age, overall health, lifestyle risk factors, and family history of disease. Counseling   Your health care provider may ask you questions about your: Alcohol use. Tobacco use. Drug use. Emotional well-being. Home and relationship well-being. Sexual activity. Eating habits. History of falls. Memory and ability to understand (cognition). Work and work Statistician. Screening  You may have the following tests or measurements: Height, weight, and BMI. Blood pressure. Lipid and cholesterol levels. These may be checked every 5 years, or more frequently if you are over 52 years old. Skin check. Lung cancer screening. You may have this screening every year starting at age 93 if you have a 30-pack-year history of smoking and currently smoke or have quit within the past 15 years. Fecal occult blood test (FOBT) of the stool. You may have this test every year starting at age 52. Flexible sigmoidoscopy or colonoscopy. You may have a sigmoidoscopy every 5 years or a colonoscopy every 10 years starting at age 57. Prostate cancer screening. Recommendations will vary depending on your family history and other risks. Hepatitis C blood test. Hepatitis B blood test. Sexually transmitted disease (STD) testing. Diabetes screening. This is done by checking your blood sugar (glucose) after you have not eaten for a while (fasting). You may have this done every 1-3 years. Abdominal aortic aneurysm (AAA) screening. You may need this if you are a current or former smoker. Osteoporosis. You may be screened starting at age 61 if you are at high risk. Talk with your health care provider about your test results, treatment options, and if necessary, the need for more tests. Vaccines  Your health care provider may recommend certain vaccines, such as: Influenza vaccine. This is recommended every year. Tetanus, diphtheria, and acellular pertussis (Tdap, Td) vaccine. You may need a Td  booster every 10 years. Zoster vaccine. You may need this after age 45. Pneumococcal 13-valent conjugate (PCV13) vaccine.  One dose is recommended after age 75. Pneumococcal polysaccharide (PPSV23) vaccine. One dose is recommended after age 53. Talk to your health care provider about which screenings and vaccines you need and how often you need them. This information is not intended to replace advice given to you by your health care provider. Make sure you discuss any questions you have with your health care provider. Document Released: 03/14/2015 Document Revised: 11/05/2015 Document Reviewed: 12/17/2014 Elsevier Interactive Patient Education  2017 [place] Prevention in the Home Falls can cause injuries. They can happen to people of all ages. There are many things you can do to make your home safe and to help prevent falls. What can I do on the outside of my home? Regularly fix the edges of walkways and driveways and fix any cracks. Remove anything that might make you trip as you walk through a door, such as a raised step or threshold. Trim any bushes or trees on the path to your home. Use bright outdoor lighting. Clear any walking paths of anything that might make someone trip, such as rocks or tools. Regularly check to see if handrails are loose or broken. Make sure that both sides of any steps have handrails. Any raised decks and porches should have guardrails on the edges. Have any leaves, snow, or ice cleared regularly. Use sand or salt on walking paths during winter. Clean up any spills in your garage right away. This includes oil or grease spills. What can I do in the bathroom? Use night lights. Install grab bars by the toilet and in the tub and shower. Do not use towel bars as grab bars. Use non-skid mats or decals in the tub or shower. If you need to sit down in the shower, use a plastic, non-slip stool. Keep the floor dry. Clean up any water that spills on the floor as soon as it happens. Remove soap buildup in the tub or shower regularly. Attach bath mats securely with double-sided  non-slip rug tape. Do not have throw rugs and other things on the floor that can make you trip. What can I do in the bedroom? Use night lights. Make sure that you have a light by your bed that is easy to reach. Do not use any sheets or blankets that are too big for your bed. They should not hang down onto the floor. Have a firm chair that has side arms. You can use this for support while you get dressed. Do not have throw rugs and other things on the floor that can make you trip. What can I do in the kitchen? Clean up any spills right away. Avoid walking on wet floors. Keep items that you use a lot in easy-to-reach places. If you need to reach something above you, use a strong step stool that has a grab bar. Keep electrical cords out of the way. Do not use floor polish or wax that makes floors slippery. If you must use wax, use non-skid floor wax. Do not have throw rugs and other things on the floor that can make you trip. What can I do with my stairs? Do not leave any items on the stairs. Make sure that there are handrails on both sides of the stairs and use them. Fix handrails that are broken or loose. Make sure that handrails are as long as the stairways. Check any carpeting  to make sure that it is firmly attached to the stairs. Fix any carpet that is loose or worn. Avoid having throw rugs at the top or bottom of the stairs. If you do have throw rugs, attach them to the floor with carpet tape. Make sure that you have a light switch at the top of the stairs and the bottom of the stairs. If you do not have them, ask someone to add them for you. What else can I do to help prevent falls? Wear shoes that: Do not have high heels. Have rubber bottoms. Are comfortable and fit you well. Are closed at the toe. Do not wear sandals. If you use a stepladder: Make sure that it is fully opened. Do not climb a closed stepladder. Make sure that both sides of the stepladder are locked into place. Ask  someone to hold it for you, if possible. Clearly mark and make sure that you can see: Any grab bars or handrails. First and last steps. Where the edge of each step is. Use tools that help you move around (mobility aids) if they are needed. These include: Canes. Walkers. Scooters. Crutches. Turn on the lights when you go into a dark area. Replace any light bulbs as soon as they burn out. Set up your furniture so you have a clear path. Avoid moving your furniture around. If any of your floors are uneven, fix them. If there are any pets around you, be aware of where they are. Review your medicines with your doctor. Some medicines can make you feel dizzy. This can increase your chance of falling. Ask your doctor what other things that you can do to help prevent falls. This information is not intended to replace advice given to you by your health care provider. Make sure you discuss any questions you have with your health care provider. Document Released: 12/12/2008 Document Revised: 07/24/2015 Document Reviewed: 03/22/2014 Elsevier Interactive Patient Education  2017 [employer].

## 2021-04-15 NOTE — Patient Instructions (Signed)
Medication Instructions:  The current medical regimen is effective;  continue present plan and medications.  *If you need a refill on your cardiac medications before your next appointment, please call your pharmacy*  Follow-Up: At [facility], you and your health needs are our priority.  As part of our continuing mission to provide you with exceptional heart care, we have created designated Provider Care Teams.  These Care Teams include your primary Cardiologist (physician) and Advanced Practice Providers (APPs -  Physician Assistants and Nurse Practitioners) who all work together to provide you with the care you need, when you need it.  We recommend signing up for the patient portal called "MyChart".  Sign up information is provided on this After Visit Summary.  MyChart is used to connect with patients for Virtual Visits (Telemedicine).  Patients are able to view lab/test results, encounter notes, upcoming appointments, etc.  Non-urgent messages can be sent to your provider as well.   To learn more about what you can do with MyChart, go to [email or web address].    Your next appointment:   1 year(s)  The format for your next appointment:   In Person  Provider:   Mertie Moores, MD     Thank you for choosing [facility]!!

## 2021-04-15 NOTE — Progress Notes (Addendum)
Virtual Visit via Telephone Note  I connected with  [name] on 04/15/21 at  9:20 AM EST by telephone and verified that I am speaking with the correct person using two identifiers.  Medicare Annual Wellness visit completed telephonically due to Covid-19 pandemic.   Persons participating in this call: This Health Coach and this patient.   Location: Patient: Home Provider: Office   I discussed the limitations, risks, security and privacy concerns of performing an evaluation and management service by telephone and the availability of in person appointments. The patient expressed understanding and agreed to proceed.  Unable to perform video visit due to video visit attempted and failed and/or patient does not have video capability.   Some vital signs may be absent or patient reported.   Willette Brace, LPN   Subjective:   [name] is a 81 y.o. male who presents for Medicare Annual/Subsequent preventive examination.  Review of Systems     Cardiac Risk Factors include: advanced age (>35men, >83 women);dyslipidemia;male gender;hypertension;obesity (BMI >30kg/m2)     Objective:    There were no vitals filed for this visit. There is no height or weight on file to calculate BMI.  Advanced Directives 04/15/2021 02/15/2020 01/15/2019 10/18/2018 10/11/2018 08/14/2018 07/18/2018  Does Patient Have a Medical Advance Directive? Yes Yes Yes Yes Yes Yes Yes  Type of [employer] [place];Living will [place];Living will [place];Living will [place];Living will [place]  Does patient want to make changes to medical advance directive? - No - Patient declined - No - Patient declined No - Patient declined No - Patient declined No - Patient declined  Copy of [place] in Chart? No - copy requested - No - copy requested No - copy  requested No - copy requested No - copy requested No - copy requested  Would patient like information on creating a medical advance directive? - - - - - - -  Pre-existing out of facility DNR order (yellow form or pink MOST form) - - - - - - -    Current Medications (verified) Outpatient Encounter Medications as of 04/15/2021  Medication Sig   apixaban (ELIQUIS) 5 MG TABS tablet Take 1 tablet by mouth twice daily   Calcium Carbonate (CALCIUM 500 PO) Take 500 mg by mouth 2 (two) times daily.   denosumab (PROLIA) 60 MG/ML SOSY injection Inject 60 mg into the skin every 6 (six) months.   esomeprazole (NEXIUM) 40 MG capsule Take 1 capsule (40 mg total) by mouth daily before breakfast.   Multiple Vitamins-Minerals (CENTRUM MULTI + OMEGA 3 PO) Take 1 tablet by mouth daily.    omega-3 acid ethyl esters (LOVAZA) 1 g capsule Take 2 capsules by mouth twice daily   rosuvastatin (CRESTOR) 10 MG tablet Take 1 tablet by mouth once daily   sildenafil (VIAGRA) 100 MG tablet Take 100 mg by mouth daily as needed for erectile dysfunction.   solifenacin (VESICARE) 10 MG tablet Take 1 tablet by mouth once daily   VITAMIN D PO Take 1,000 Units by mouth daily.   No facility-administered encounter medications on file as of 04/15/2021.    Allergies (verified) Hydrocodone, Monosodium glutamate, Naproxen sodium, and Other   History: Past Medical History:  Diagnosis Date   Diverticulosis    Esophageal reflux    Esophageal stricture    HA (headache)    Hearing loss  Hiatal hernia    Hypertriglyceridemia    Hypoglycemia    Obesity, unspecified    PAF (paroxysmal atrial fibrillation) (HCC)    Echo with normal LV function and mild LVH   Presence of permanent cardiac pacemaker    Renal mass    left   Syncope    presented with atrial fib converted with Diltiazem   Systemic inflammatory response syndrome ([place]) 06/26/2018   Past Surgical History:  Procedure Laterality Date   CYSTOSCOPY WITH URETEROSCOPY  AND STENT PLACEMENT Left 08/16/2018   Procedure: CYSTOSCOPY WITH LEFT RETROGRADE PYELOGRAM, BALLOON DILATION LEFT URETER, STENT PLACEMENT;  Surgeon: Lucas Mallow, MD;  Location: [facility];  Service: Urology;  Laterality: Left;   HERNIA REPAIR     INSERT / REPLACE / REMOVE PACEMAKER     LAPAROSCOPIC ASSISTED VENTRAL HERNIA REPAIR N/A 10/18/2018   Procedure: LAPAROSCOPIC ASSISTED VENTRAL WALL HERNIA REPAIR WITH MESH;  Surgeon: Michael Boston, MD;  Location: [facility];  Service: General;  Laterality: N/A;   LAPAROSCOPIC NEPHRECTOMY, HAND ASSISTED Left 10/18/2018   Procedure: LEFT HAND ASSISTED LAPAROSCOPIC RADICAL NEPHRECTOMY WITH ADRENALECTOMY;  Surgeon: Lucas Mallow, MD;  Location: [facility];  Service: Urology;  Laterality: Left;   LOOP RECORDER INSERTION N/A 04/26/2018   Procedure: LOOP RECORDER INSERTION;  Surgeon: Thompson Grayer, MD;  Location: [facility];  Service: Cardiovascular;  Laterality: N/A;   LOOP RECORDER REMOVAL N/A 07/18/2018   Procedure: LOOP RECORDER REMOVAL;  Surgeon: Thompson Grayer, MD;  Location: [facility];  Service: Cardiovascular;  Laterality: N/A;   NASAL SEPTUM SURGERY     PACEMAKER IMPLANT N/A 07/18/2018   Procedure: PACEMAKER IMPLANT;  Surgeon: Thompson Grayer, MD;  Location: [facility];  Service: Cardiovascular;  Laterality: N/A;   TONSILLECTOMY     TONSILLECTOMY     UMBILICAL HERNIA REPAIR  1993   WISDOM TOOTH EXTRACTION     about 81 years old   Family History  Problem Relation Age of Onset   Emphysema Father    Gallbladder disease Sister    Bipolar disorder Sister    Bipolar disorder Paternal Grandmother    Colon cancer Neg Hx    Social History   Socioeconomic History   Marital status: Married    Spouse name: Not on file   Number of children: 1   Years of education: Not on file   Highest education level: Not on file  Occupational History   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  Tobacco Use   Smoking status:  Former    Types: Cigarettes    Quit date: 03/02/1987    Years since quitting: 34.1   Smokeless tobacco: Never  Vaping Use   Vaping Use: Never used  Substance and Sexual Activity   Alcohol use: No    Alcohol/week: 0.0 standard drinks   Drug use: No   Sexual activity: Not Currently  Other Topics Concern   Not on file  Social History Narrative   Not on file   Social Determinants of Health   Financial Resource Strain: Low Risk    Difficulty of Paying Living Expenses: Not hard at all  Food Insecurity: No Food Insecurity   Worried About Charity fundraiser in the Last Year: Never true   [place] in the Last Year: Never true  Transportation Needs: No Transportation Needs   Lack of Transportation (Medical): No   Lack of Transportation (Non-Medical): No  Physical Activity: Inactive  Days of Exercise per Week: 0 days   Minutes of Exercise per Session: 0 min  Stress: No Stress Concern Present   Feeling of Stress : Not at all  Social Connections: Moderately Integrated   Frequency of Communication with Friends and Family: More than three times a week   Frequency of Social Gatherings with Friends and Family: More than three times a week   Attends Religious Services: More than 4 times per year   Active Member of [employer] or Organizations: No   Attends Music therapist: Never   Marital Status: Married    Tobacco Counseling Counseling given: Not Answered   Clinical Intake:  Pre-visit preparation completed: Yes  Pain : No/denies pain     BMI - recorded: 31.32 Nutritional Status: BMI > 30  Obese Nutritional Risks: None Diabetes: No  How often do you need to have someone help you when you read instructions, pamphlets, or other written materials from your doctor or pharmacy?: 1 - Never  Diabetic?No  Interpreter Needed?: No  Information entered by :: Charlott Rakes, LPN   Activities of Daily Living In your present state of health, do you have any  difficulty performing the following activities: 04/15/2021  Hearing? Y  Vision? N  Difficulty concentrating or making decisions? Y  Comment Short term at times  Walking or climbing stairs? N  Dressing or bathing? N  Doing errands, shopping? N  Preparing Food and eating ? N  Using the Toilet? N  In the past six months, have you accidently leaked urine? N  Do you have problems with loss of bowel control? N  Managing your Medications? N  Managing your Finances? N  Housekeeping or managing your Housekeeping? N  Some recent data might be hidden    Patient Care Team: [name], MD as PCP - General Nahser, Wonda Cheng, MD as PCP - Cardiology (Cardiology) Thompson Grayer, MD as PCP - Electrophysiology (Cardiology) Irene Shipper, MD as Consulting Physician (Gastroenterology) Michael Boston, MD as Consulting Physician (General Surgery) Thompson Grayer, MD as Consulting Physician (Cardiology) Lucas Mallow, MD as Consulting Physician (Urology) Wyatt Portela, MD as Consulting Physician (Oncology) Delice Bison Darnelle Maffucci, [facility] (Pharmacist)  Indicate any recent Medical Services you may have received from other than [facility] providers in the past year (date may be approximate).     Assessment:   This is a routine wellness examination for [facility].  Hearing/Vision screen Hearing Screening - Comments:: Pt wears a hearing aid  Vision Screening - Comments:: Pt follows up with [facility] for annual eye exams   Dietary issues and exercise activities discussed: Current Exercise Habits: The patient does not participate in regular exercise at present   Goals Addressed             This Visit's Progress    Patient Stated       None at this time        Depression Screen PHQ 2/9 Scores 04/15/2021 02/15/2020 01/17/2020 01/15/2019 01/15/2019 01/15/2018 01/11/2018  PHQ - 2 Score 0 0 0 2 2 0 0  PHQ- 9 Score - - - 8 - - 0    Fall Risk Fall Risk  04/15/2021 02/15/2020 01/17/2020  01/15/2019 01/15/2018  Falls in the past year? 0 0 0 0 0  Comment - - - - -  Number falls in past yr: 0 0 - 0 -  Injury with Fall? 0 0 - 0 -  Risk for fall due to : Impaired  vision No Fall Risks - - -  Follow up Falls prevention discussed Falls evaluation completed - - -    FALL RISK PREVENTION PERTAINING TO THE HOME:  Any stairs in or around the home? Yes  If so, are there any without handrails? No  Home free of loose throw rugs in walkways, pet beds, electrical cords, etc? Yes  Adequate lighting in your home to reduce risk of falls? Yes   ASSISTIVE DEVICES UTILIZED TO PREVENT FALLS:  Life alert? No  Use of a cane, walker or w/c? No  Grab bars in the bathroom? No  Shower chair or bench in shower? No  Elevated toilet seat or a handicapped toilet? No   TIMED UP AND GO:  Was the test performed? No .   Cognitive Function: declined at this time  MMSE - Mini Mental State Exam 01/11/2018  Not completed: Refused        Immunizations Immunization History  Administered Date(s) Administered   Fluad Quad(high Dose 65+) 12/06/2018, 01/17/2020, 12/05/2020   Influenza Whole 01/19/2010   Influenza, High Dose Seasonal PF 01/06/2016, 01/10/2017, 01/11/2018   Influenza,inj,Quad PF,6+ Mos 11/28/2012, 12/03/2013, 12/25/2014   PFIZER(Purple Top)SARS-COV-2 Vaccination 04/15/2019, 05/08/2019, 02/01/2020   Pneumococcal Conjugate-13 11/28/2012   Pneumococcal Polysaccharide-23 12/03/2013, 01/17/2020   Td 03/01/2006   Tdap 04/07/2016   Zoster Recombinat (Shingrix) 10/18/2017, 05/09/2018   Zoster, Live 03/30/2013    TDAP status: Up to date  Flu Vaccine status: Up to date  Pneumococcal vaccine status: Up to date  Covid-19 vaccine status: Completed vaccines  Qualifies for Shingles Vaccine? Yes   Zostavax completed Yes   Shingrix Completed?: Yes  Screening Tests Health Maintenance  Topic Date Due   Hepatitis C Screening  Never done   COVID-19 Vaccine (4 - Booster for Pfizer  series) 03/28/2020   COLONOSCOPY (Pts 45-52yrs Insurance coverage will need to be confirmed)  08/09/2021   TETANUS/TDAP  04/07/2026   Pneumonia Vaccine 21+ Years old  Completed   INFLUENZA VACCINE  Completed   Zoster Vaccines- Shingrix  Completed   HPV VACCINES  Aged Out    Health Maintenance  Health Maintenance Due  Topic Date Due   Hepatitis C Screening  Never done   COVID-19 Vaccine (4 - Booster for [place] series) 03/28/2020    Colorectal cancer screening: Type of screening: Colonoscopy. Completed 08/09/16. Repeat every 5 years   Additional Screening:  Hepatitis C Screening: does qualify;   Vision Screening: Recommended annual ophthalmology exams for early detection of glaucoma and other disorders of the eye. Is the patient up to date with their annual eye exam?  Yes  Who is the provider or what is the name of the office in which the patient attends annual eye exams? [facility]  If pt is not established with a provider, would they like to be referred to a provider to establish care? No .   Dental Screening: Recommended annual dental exams for proper oral hygiene  Community Resource Referral / Chronic Care Management: CRR required this visit?  No   CCM required this visit?  No      Plan:     I have personally reviewed and noted the following in the patients chart:   Medical and social history Use of alcohol, tobacco or illicit drugs  Current medications and supplements including opioid prescriptions. Patient is not currently taking opioid prescriptions. Functional ability and status Nutritional status Physical activity Advanced directives List of other physicians Hospitalizations, surgeries, and ER visits in previous 10  months Vitals Screenings to include cognitive, depression, and falls Referrals and appointments  In addition, I have reviewed and discussed with patient certain preventive protocols, quality metrics, and best practice  recommendations. A written personalized care plan for preventive services as well as general preventive health recommendations were provided to patient.     Willette Brace, LPN   07/23/9100   Nurse Notes: Pt is requesting a specialist referral for sinus concerns, He is asking Ear nose and throat provider please advise

## 2021-04-21 NOTE — Progress Notes (Signed)
Remote pacemaker transmission.   

## 2021-05-12 ENCOUNTER — Ambulatory Visit (HOSPITAL_COMMUNITY)
Admission: RE | Admit: 2021-05-12 | Discharge: 2021-05-12 | Disposition: A | Discharge status: Home / Self Care | Payer: Medicare Other | Source: Ambulatory Visit | Attending: Neurology | Admitting: Neurology

## 2021-05-12 ENCOUNTER — Other Ambulatory Visit: Payer: Self-pay

## 2021-05-12 DIAGNOSIS — Z95818 Presence of other cardiac implants and grafts: Secondary | ICD-10-CM | POA: Diagnosis not present

## 2021-05-12 DIAGNOSIS — R413 Other amnesia: Secondary | ICD-10-CM | POA: Diagnosis not present

## 2021-05-12 DIAGNOSIS — Z950 Presence of cardiac pacemaker: Secondary | ICD-10-CM | POA: Diagnosis not present

## 2021-05-12 DIAGNOSIS — G4489 Other headache syndrome: Secondary | ICD-10-CM | POA: Diagnosis not present

## 2021-05-12 DIAGNOSIS — R4189 Other symptoms and signs involving cognitive functions and awareness: Secondary | ICD-10-CM | POA: Diagnosis not present

## 2021-05-12 NOTE — Progress Notes (Signed)
Per order, Changed device settings for MRI to  ?DOO at 80 bpm  ?Will program device back to pre-MRI settings after completion of exam, and send transmission ?

## 2021-05-13 ENCOUNTER — Other Ambulatory Visit: Payer: Self-pay | Admitting: Cardiovascular Disease

## 2021-05-13 ENCOUNTER — Telehealth: Payer: Self-pay | Admitting: Cardiovascular Disease

## 2021-05-13 DIAGNOSIS — I480 Paroxysmal atrial fibrillation: Secondary | ICD-10-CM

## 2021-05-13 NOTE — Telephone Encounter (Signed)
?*  STAT* If patient is at the pharmacy, call can be transferred to refill team. ? ? ?1. Which medications need to be refilled? (please list name of each medication and dose if known) Eliquis ? ?2. Which pharmacy/location (including street and city if local pharmacy) is medication to be sent to?[facility] [place] ? ?3. Do they need a 30 day or 90 day supply? Please call today just have  enough for today ? ?

## 2021-05-13 NOTE — Telephone Encounter (Signed)
Prescription refill request for Eliquis received. ?Indication: Afib  ?Last office visit: 04/15/21 ([place]) ?Scr: 1.15 (01/19/21) ?Age: 81 ?Weight: 84.3kg ? ?Appropriate dose and refill sent to requested pharmacy.  ?

## 2021-05-13 NOTE — Telephone Encounter (Signed)
Eliquis 5mg  refill duplicate request received. This request was sent into the requested pharmacy on today at 1005am & receipt confirmed; also confirmed with the pharmacy that they have received. Will not resend request since confirmation received.  ?

## 2021-05-14 ENCOUNTER — Other Ambulatory Visit: Payer: Self-pay | Admitting: Internal Medicine

## 2021-05-14 ENCOUNTER — Telehealth: Payer: Self-pay

## 2021-05-14 NOTE — Telephone Encounter (Signed)
Pt is calling stating that the had a test done yesterday and they advised him that nothing new was found. ? ? Pt is asking for a ENT referral for his sinuses. ? ?Pt states that he and [name] spoke about this issue with drainage at his last appt in 11/22.  ? ?Pt didn't want to schedule an appt for the referral but we got him scheduled for the 52mo fu as requested on 01/19/21. ? ?Please advise pt. [phone] ?

## 2021-05-15 ENCOUNTER — Other Ambulatory Visit: Payer: Self-pay | Admitting: Internal Medicine

## 2021-05-18 ENCOUNTER — Other Ambulatory Visit: Payer: Self-pay | Admitting: Internal Medicine

## 2021-05-18 DIAGNOSIS — J329 Chronic sinusitis, unspecified: Secondary | ICD-10-CM

## 2021-05-18 HISTORY — DX: Chronic sinusitis, unspecified: J32.9

## 2021-06-03 ENCOUNTER — Telehealth: Payer: Self-pay

## 2021-06-03 NOTE — Telephone Encounter (Signed)
Called pt, LVM to discuss.  ? ?Pt needs an OV to discuss per referral dept.  ?

## 2021-06-03 NOTE — Telephone Encounter (Signed)
-----   Message from Carlynn Purl sent at 05/29/2021  1:43 PM EDT ----- ?This patient will need office visit before being referred. Specialist office require office note.  ? ?

## 2021-06-12 ENCOUNTER — Encounter: Payer: Medicare Other | Admitting: Student

## 2021-06-12 ENCOUNTER — Ambulatory Visit (INDEPENDENT_AMBULATORY_CARE_PROVIDER_SITE_OTHER): Payer: Medicare Other | Admitting: Internal Medicine

## 2021-06-12 ENCOUNTER — Encounter (HOSPITAL_BASED_OUTPATIENT_CLINIC_OR_DEPARTMENT_OTHER): Payer: Self-pay | Admitting: Internal Medicine

## 2021-06-12 VITALS — BP 114/60 | HR 60 | Ht 65.0 in | Wt 186.0 lb

## 2021-06-12 DIAGNOSIS — Z950 Presence of cardiac pacemaker: Secondary | ICD-10-CM | POA: Diagnosis not present

## 2021-06-12 DIAGNOSIS — I495 Sick sinus syndrome: Secondary | ICD-10-CM

## 2021-06-12 DIAGNOSIS — D6869 Other thrombophilia: Secondary | ICD-10-CM

## 2021-06-12 DIAGNOSIS — I480 Paroxysmal atrial fibrillation: Secondary | ICD-10-CM | POA: Diagnosis not present

## 2021-06-12 NOTE — Patient Instructions (Addendum)
Medication Instructions:  ?Your physician recommends that you continue on your current medications as directed. Please refer to the Current Medication list given to you today. ? ?Labwork: ?None ordered. ? ?Testing/Procedures: ?None ordered. ? ?Follow-Up: ?Your physician wants you to follow-up in: one year with Dr. Rayann Heman. ?You will receive a reminder letter in the mail two months in advance. If you don't receive a letter, please call our office to schedule the follow-up appointment. ? ? ?Remote monitoring is used to monitor your Pacemaker from home. This monitoring reduces the number of office visits required to check your device to one time per year. It allows [place] to keep an eye on the functioning of your device to ensure it is working properly. You are scheduled for a device check from home on 07/15/2021. You may send your transmission at any time that day. If you have a wireless device, the transmission will be sent automatically. After your physician reviews your transmission, you will receive a postcard with your next transmission date. ? ?Any Other Special Instructions Will Be Listed Below (If Applicable). ? ?If you need a refill on your cardiac medications before your next appointment, please call your pharmacy.  ? ?Important Information About Sugar ? ? ? ? ? ? ? ?

## 2021-06-12 NOTE — Progress Notes (Signed)
? ? ? ?PCP: [name], MD ?Primary Cardiologist: Dr Acie Fredrickson ?Primary EP:  Dr Rayann Heman ? ?[name] is a 81 y.o. male who presents today for routine electrophysiology followup.  Since last being seen in our clinic, the patient reports doing very well.  He is very active with his company ([employer]).  Today, he denies symptoms of palpitations, chest pain, shortness of breath,  lower extremity edema, dizziness, presyncope, or syncope.  The patient is otherwise without complaint today.  ? ?Past Medical History:  ?Diagnosis Date  ? Diverticulosis   ? Esophageal reflux   ? Esophageal stricture   ? HA (headache)   ? Hearing loss   ? Hiatal hernia   ? Hypertriglyceridemia   ? Hypoglycemia   ? Obesity, unspecified   ? PAF (paroxysmal atrial fibrillation) ([place])   ? Echo with normal LV function and mild LVH  ? Presence of permanent cardiac pacemaker   ? 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 Syncope   ? presented with atrial fib converted with Diltiazem  ? Systemic inflammatory response syndrome ([place]) 06/26/2018  ? ?Past Surgical History:  ?Procedure Laterality Date  ? CYSTOSCOPY WITH URETEROSCOPY AND STENT PLACEMENT Left 08/16/2018  ? Procedure: CYSTOSCOPY WITH LEFT RETROGRADE PYELOGRAM, BALLOON DILATION LEFT URETER, STENT PLACEMENT;  Surgeon: Lucas Mallow, MD;  Location: [facility];  Service: Urology;  Laterality: Left;  ? HERNIA REPAIR    ? INSERT / REPLACE / REMOVE PACEMAKER    ? LAPAROSCOPIC ASSISTED VENTRAL HERNIA REPAIR N/A 10/18/2018  ? Procedure: LAPAROSCOPIC ASSISTED VENTRAL WALL HERNIA REPAIR WITH MESH;  Surgeon: Michael Boston, MD;  Location: [facility];  Service: General;  Laterality: N/A;  ? LAPAROSCOPIC NEPHRECTOMY, HAND ASSISTED Left 10/18/2018  ? Procedure: LEFT HAND ASSISTED LAPAROSCOPIC RADICAL NEPHRECTOMY WITH ADRENALECTOMY;  Surgeon: Lucas Mallow, MD;  Location: [facility];  Service: Urology;  Laterality: Left;  ? LOOP RECORDER INSERTION N/A 04/26/2018  ? Procedure: LOOP RECORDER INSERTION;  Surgeon:  Thompson Grayer, MD;  Location: [facility];  Service: Cardiovascular;  Laterality: N/A;  ? LOOP RECORDER REMOVAL N/A 07/18/2018  ? Procedure: LOOP RECORDER REMOVAL;  Surgeon: Thompson Grayer, MD;  Location: [facility];  Service: Cardiovascular;  Laterality: N/A;  ? NASAL SEPTUM SURGERY    ? PACEMAKER IMPLANT N/A 07/18/2018  ? Procedure: PACEMAKER IMPLANT;  Surgeon: Thompson Grayer, MD;  Location: [facility];  Service: Cardiovascular;  Laterality: N/A;  ? TONSILLECTOMY    ? TONSILLECTOMY    ? [place]  ? WISDOM TOOTH EXTRACTION    ? about 81 years old  ? ? ?ROS- all systems are reviewed and negative except as per HPI above ? ?Current Outpatient Medications  ?Medication Sig Dispense Refill  ? apixaban (ELIQUIS) 5 MG TABS tablet Take 1 tablet by mouth twice daily 180 tablet 1  ? Calcium Carbonate (CALCIUM 500 PO) Take 500 mg by mouth 2 (two) times daily.    ? denosumab (PROLIA) 60 MG/ML SOSY injection Inject 60 mg into the skin every 6 (six) months.    ? esomeprazole (NEXIUM) 40 MG capsule Take 1 capsule (40 mg total) by mouth daily before breakfast. 90 capsule 3  ? Multiple Vitamins-Minerals (CENTRUM MULTI + OMEGA 3 PO) Take 1 tablet by mouth daily.     ? omega-3 acid ethyl esters (LOVAZA) 1 g capsule Take 2 capsules by mouth twice daily 360 capsule 1  ? rosuvastatin (CRESTOR) 10  MG tablet Take 1 tablet by mouth once daily 90 tablet 1  ? sildenafil (VIAGRA) 100 MG tablet Take 100 mg by mouth daily as needed for erectile dysfunction.    ? solifenacin (VESICARE) 10 MG tablet Take 1 tablet by mouth once daily 90 tablet 0  ? VITAMIN D PO Take 1,000 Units by mouth daily.    ? ?No current facility-administered medications for this visit.  ? ? ?Physical Exam: ?Vitals:  ? 06/12/21 0946  ?BP: 114/60  ?Pulse: 60  ?SpO2: 97%  ?Weight: 186 lb (84.4 kg)  ?Height: 5\' 5"  (1.651 m)  ? ? ?GEN- The patient is well appearing, alert and oriented x 3 today.   ?Head- normocephalic, atraumatic ?Eyes-   Sclera clear, conjunctiva pink ?Ears- hearing intact ?Oropharynx- clear ?Lungs- Clear to ausculation bilaterally, normal work of breathing ?Chest- pacemaker pocket is well healed ?Heart- Regular rate and rhythm, no murmurs, rubs or gallops, PMI not laterally displaced ?GI- soft, NT, ND, + BS ?Extremities- no clubbing, cyanosis, or edema ? ?Pacemaker interrogation- reviewed in detail today,  See PACEART report ? ?ekg tracing ordered today is personally reviewed and shows atrial paced 60 bpm, PR 248 msec ? ?Assessment and Plan: ? ?1. Symptomatic sinus bradycardia  ?Normal pacemaker function ?See Claudia Desanctis Art report ?No changes today ?he is not device dependant today ? ?2. Paroxysmal atrial fibrillation ?Chads2vasc score is 2. ?He has not had afib in over a year ?He wishes to continue eliquis ?Labs 01/19/21 reviewed ? ?Return in a year ? ?Thompson Grayer MD, FACC ?06/12/2021 ?9:58 AM ? ? ?

## 2021-07-02 ENCOUNTER — Telehealth: Payer: Self-pay | Admitting: Internal Medicine

## 2021-07-02 NOTE — Telephone Encounter (Signed)
PT calls today in regards to their PROLIA shot. Looks like PT had his last PROLIA shot in August of last year on our end. PT was wondering when his last one was as well as getting a new appointment for that [place]. ? ?CB: [phone] ?

## 2021-07-05 NOTE — Telephone Encounter (Signed)
Prior auth APPROVED via [facility] Provider Portal.  ?PA# [id number] ?Valid 07/05/21-07/06/22 ? ? ? ? ?

## 2021-07-05 NOTE — Telephone Encounter (Signed)
Pt ready for scheduling on or after 07/05/21 ? ?Out-of-pocket cost due at time of visit: $296 ? ?Primary: UHC Medicare ?Prolia co-insurance: 20% (approximately $276) ?Admin fee co-insurance: $20 ? ?Secondary: n/a ?Prolia co-insurance:  ?Admin fee co-insurance:  ? ?Deductible: does not apply ? ?Prior Auth: APPROVED ?PA# [id number] ?Valid 07/05/21-07/06/22 ? ?** This summary of benefits is an estimation of the patient's out-of-pocket cost. Exact cost may vary based on individual plan coverage.  ? ?

## 2021-07-05 NOTE — Telephone Encounter (Signed)
Prolia VOB initiated via [email or web address] ? ?Last Prolia inj 10/29/20 ?Next Prolia inj due 04/29/21 ? ?

## 2021-07-05 NOTE — Telephone Encounter (Signed)
Prior auth required for PROLIA  PA PROCESS DETAILS: Effective 03/01/2021 if the patient is new to Prolia, Prior authorization and Step Therapy are required & not on file. Please go to [email or web address] or call [phone] to initiate the prior authorization. For exception to the policy please visit [email or web address] and review Policy Number [id number] 

## 2021-07-09 ENCOUNTER — Ambulatory Visit (INDEPENDENT_AMBULATORY_CARE_PROVIDER_SITE_OTHER): Payer: Medicare Other | Admitting: *Deleted

## 2021-07-09 DIAGNOSIS — M810 Age-related osteoporosis without current pathological fracture: Secondary | ICD-10-CM

## 2021-07-09 DIAGNOSIS — M8000XD Age-related osteoporosis with current pathological fracture, unspecified site, subsequent encounter for fracture with routine healing: Secondary | ICD-10-CM

## 2021-07-09 MED ORDER — DENOSUMAB 60 MG/ML ~~LOC~~ SOSY
60.0000 mg | PREFILLED_SYRINGE | Freq: Once | SUBCUTANEOUS | Status: AC
  Administered 2021-07-09: 60 mg via SUBCUTANEOUS

## 2021-07-09 NOTE — Progress Notes (Signed)
Patient here for Prolia injection. Given in left arm subq. Patient tolerated well  ? ?Please co signe  ?

## 2021-07-14 ENCOUNTER — Other Ambulatory Visit: Payer: Self-pay | Admitting: Internal Medicine

## 2021-07-14 DIAGNOSIS — M810 Age-related osteoporosis without current pathological fracture: Secondary | ICD-10-CM

## 2021-07-14 HISTORY — DX: Age-related osteoporosis without current pathological fracture: M81.0

## 2021-07-15 ENCOUNTER — Ambulatory Visit (INDEPENDENT_AMBULATORY_CARE_PROVIDER_SITE_OTHER): Payer: Medicare Other

## 2021-07-15 DIAGNOSIS — I495 Sick sinus syndrome: Secondary | ICD-10-CM

## 2021-07-16 LAB — CUP PACEART REMOTE DEVICE CHECK
Battery Remaining Longevity: 138 mo
Battery Voltage: 3.02 V
Brady Statistic AP VP Percent: 0.03 %
Brady Statistic AP VS Percent: 43.54 %
Brady Statistic AS VP Percent: 0.01 %
Brady Statistic AS VS Percent: 56.42 %
Brady Statistic RA Percent Paced: 43.64 %
Brady Statistic RV Percent Paced: 0.04 %
Date Time Interrogation Session: 20230516205541
Implantable Lead Implant Date: 20200519
Implantable Lead Implant Date: 20200519
Implantable Lead Location: 753859
Implantable Lead Location: 753860
Implantable Lead Model: 5076
Implantable Lead Model: 5076
Implantable Pulse Generator Implant Date: 20200519
Lead Channel Impedance Value: 342 Ohm
Lead Channel Impedance Value: 418 Ohm
Lead Channel Impedance Value: 513 Ohm
Lead Channel Impedance Value: 532 Ohm
Lead Channel Pacing Threshold Amplitude: 0.5 V
Lead Channel Pacing Threshold Amplitude: 0.5 V
Lead Channel Pacing Threshold Pulse Width: 0.4 ms
Lead Channel Pacing Threshold Pulse Width: 0.4 ms
Lead Channel Sensing Intrinsic Amplitude: 4.625 mV
Lead Channel Sensing Intrinsic Amplitude: 4.625 mV
Lead Channel Sensing Intrinsic Amplitude: 5.5 mV
Lead Channel Sensing Intrinsic Amplitude: 5.5 mV
Lead Channel Setting Pacing Amplitude: 1.5 V
Lead Channel Setting Pacing Amplitude: 2.5 V
Lead Channel Setting Pacing Pulse Width: 0.4 ms
Lead Channel Setting Sensing Sensitivity: 2 mV

## 2021-07-18 ENCOUNTER — Other Ambulatory Visit: Payer: Self-pay | Admitting: Internal Medicine

## 2021-07-20 ENCOUNTER — Ambulatory Visit (INDEPENDENT_AMBULATORY_CARE_PROVIDER_SITE_OTHER): Payer: Medicare Other | Admitting: Internal Medicine

## 2021-07-20 ENCOUNTER — Encounter: Payer: Self-pay | Admitting: Internal Medicine

## 2021-07-20 VITALS — BP 122/78 | HR 60 | Temp 98.1°F | Ht 65.0 in | Wt 182.0 lb

## 2021-07-20 DIAGNOSIS — I10 Essential (primary) hypertension: Secondary | ICD-10-CM

## 2021-07-20 DIAGNOSIS — J329 Chronic sinusitis, unspecified: Secondary | ICD-10-CM | POA: Diagnosis not present

## 2021-07-20 DIAGNOSIS — Z1159 Encounter for screening for other viral diseases: Secondary | ICD-10-CM | POA: Diagnosis not present

## 2021-07-20 DIAGNOSIS — M810 Age-related osteoporosis without current pathological fracture: Secondary | ICD-10-CM

## 2021-07-20 DIAGNOSIS — E785 Hyperlipidemia, unspecified: Secondary | ICD-10-CM

## 2021-07-20 DIAGNOSIS — D696 Thrombocytopenia, unspecified: Secondary | ICD-10-CM | POA: Diagnosis not present

## 2021-07-20 DIAGNOSIS — N1832 Chronic kidney disease, stage 3b: Secondary | ICD-10-CM | POA: Diagnosis not present

## 2021-07-20 LAB — CBC WITH DIFFERENTIAL/PLATELET
Basophils Absolute: 0 10*3/uL (ref 0.0–0.1)
Basophils Relative: 0.3 % (ref 0.0–3.0)
Eosinophils Absolute: 0.1 10*3/uL (ref 0.0–0.7)
Eosinophils Relative: 1.8 % (ref 0.0–5.0)
HCT: 37.9 % — ABNORMAL LOW (ref 39.0–52.0)
Hemoglobin: 13.1 g/dL (ref 13.0–17.0)
Lymphocytes Relative: 14.3 % (ref 12.0–46.0)
Lymphs Abs: 0.9 10*3/uL (ref 0.7–4.0)
MCHC: 34.6 g/dL (ref 30.0–36.0)
MCV: 96.2 fl (ref 78.0–100.0)
Monocytes Absolute: 0.5 10*3/uL (ref 0.1–1.0)
Monocytes Relative: 7.9 % (ref 3.0–12.0)
Neutro Abs: 4.8 10*3/uL (ref 1.4–7.7)
Neutrophils Relative %: 75.7 % (ref 43.0–77.0)
Platelets: 143 10*3/uL — ABNORMAL LOW (ref 150.0–400.0)
RBC: 3.94 Mil/uL — ABNORMAL LOW (ref 4.22–5.81)
RDW: 14.4 % (ref 11.5–15.5)
WBC: 6.4 10*3/uL (ref 4.0–10.5)

## 2021-07-20 LAB — BASIC METABOLIC PANEL
BUN: 20 mg/dL (ref 6–23)
CO2: 27 mEq/L (ref 19–32)
Calcium: 9.2 mg/dL (ref 8.4–10.5)
Chloride: 102 mEq/L (ref 96–112)
Creatinine, Ser: 1.11 mg/dL (ref 0.40–1.50)
GFR: 63.18 mL/min (ref >60.00)
Glucose, Bld: 109 mg/dL — ABNORMAL HIGH (ref 70–99)
Potassium: 4.5 mEq/L (ref 3.5–5.1)
Sodium: 136 mEq/L (ref 135–145)

## 2021-07-20 LAB — VITAMIN D 25 HYDROXY (VIT D DEFICIENCY, FRACTURES): VITD: 70.99 ng/mL (ref 30.00–100.00)

## 2021-07-20 MED ORDER — ROSUVASTATIN CALCIUM 10 MG PO TABS: 10.0000 mg | ORAL_TABLET | Freq: Every day | ORAL | 1 refills | Status: DC

## 2021-07-20 NOTE — Progress Notes (Signed)
Subjective:  Patient ID: Frank Hoover., male    DOB: 04/19/1940  Age: 81 y.o. MRN: [id number]  CC: Hypertension and Hyperlipidemia   HPI Frank Betancur. presents for f/up -  He is active and denies chest pain, shortness of breath, diaphoresis, dizziness, lightheadedness, or edema.  He wants to see an ENT doctor.  Outpatient Medications Prior to Visit  Medication Sig Dispense Refill   apixaban (ELIQUIS) 5 MG TABS tablet Take 1 tablet by mouth twice daily 180 tablet 1   Calcium Carbonate (CALCIUM 500 PO) Take 500 mg by mouth 2 (two) times daily.     denosumab (PROLIA) 60 MG/ML SOSY injection Inject 60 mg into the skin every 6 (six) months.     esomeprazole (NEXIUM) 40 MG capsule Take 1 capsule (40 mg total) by mouth daily before breakfast. 90 capsule 3   Multiple Vitamins-Minerals (CENTRUM MULTI + OMEGA 3 PO) Take 1 tablet by mouth daily.      omega-3 acid ethyl esters (LOVAZA) 1 g capsule Take 2 capsules by mouth twice daily 360 capsule 1   sildenafil (VIAGRA) 100 MG tablet Take 100 mg by mouth daily as needed for erectile dysfunction.     solifenacin (VESICARE) 10 MG tablet Take 1 tablet by mouth once daily 90 tablet 0   VITAMIN D PO Take 1,000 Units by mouth daily.     rosuvastatin (CRESTOR) 10 MG tablet Take 1 tablet by mouth once daily 90 tablet 1   No facility-administered medications prior to visit.    ROS Review of Systems  Constitutional: Negative.  Negative for diaphoresis and fatigue.  HENT:  Positive for postnasal drip and rhinorrhea. Negative for nosebleeds, sinus pressure, sinus pain, sore throat and trouble swallowing.   Eyes: Negative.   Respiratory:  Negative for cough, chest tightness, shortness of breath and wheezing.   Cardiovascular:  Negative for chest pain, palpitations and leg swelling.  Gastrointestinal:  Negative for abdominal pain, diarrhea, nausea and vomiting.  Endocrine: Negative.   Genitourinary: Negative.  Negative for difficulty urinating  and hematuria.  Musculoskeletal: Negative.   Skin: Negative.  Negative for color change.  Neurological: Negative.  Negative for dizziness, weakness and light-headedness.  Hematological:  Negative for adenopathy. Does not bruise/bleed easily.  Psychiatric/Behavioral: Negative.     Objective:  BP 122/78 (BP Location: Left Arm, Patient Position: Sitting, Cuff Size: Large)   Pulse 60   Temp 98.1 F (36.7 C) (Oral)   Ht 5\' 5"  (1.651 m)   Wt 182 lb (82.6 kg)   SpO2 97%   BMI 30.29 kg/m   BP Readings from Last 3 Encounters:  07/20/21 122/78  06/12/21 114/60  04/15/21 120/68    Wt Readings from Last 3 Encounters:  07/20/21 182 lb (82.6 kg)  06/12/21 186 lb (84.4 kg)  04/15/21 185 lb 12.8 oz (84.3 kg)    Physical Exam Vitals reviewed.  HENT:     Nose: Nose normal.     Mouth/Throat:     Mouth: Mucous membranes are moist.  Eyes:     General: No scleral icterus.    Conjunctiva/sclera: Conjunctivae normal.  Cardiovascular:     Rate and Rhythm: Normal rate and regular rhythm.     Heart sounds: No murmur heard. Pulmonary:     Effort: Pulmonary effort is normal.     Breath sounds: No stridor. No wheezing, rhonchi or rales.  Abdominal:     General: Abdomen is flat.     Palpations: There is no  mass.     Tenderness: There is no abdominal tenderness. There is no guarding.     Hernia: No hernia is present.  Musculoskeletal:        General: Normal range of motion.     Cervical back: Neck supple.     Right lower leg: No edema.     Left lower leg: No edema.  Skin:    General: Skin is warm and dry.  Neurological:     General: No focal deficit present.     Mental Status: He is alert.  Psychiatric:        Mood and Affect: Mood normal.        Behavior: Behavior normal.    Lab Results  Component Value Date   WBC 6.4 07/20/2021   HGB 13.1 07/20/2021   HCT 37.9 (L) 07/20/2021   PLT 143.0 (L) 07/20/2021   GLUCOSE 109 (H) 07/20/2021   CHOL 117 01/19/2021   TRIG 166.0 (H)  01/19/2021   HDL 32.80 (L) 01/19/2021   LDLDIRECT 59.0 01/17/2020   LDLCALC 51 01/19/2021   ALT 20 01/19/2021   AST 21 01/19/2021   NA 136 07/20/2021   K 4.5 07/20/2021   CL 102 07/20/2021   CREATININE 1.11 07/20/2021   BUN 20 07/20/2021   CO2 27 07/20/2021   TSH 2.21 01/17/2020   PSA 0.10 01/17/2020   INR 1.2 10/11/2018   HGBA1C 5.9 01/19/2021    MR ANGIO HEAD WO CONTRAST  Result Date: 05/13/2021 CLINICAL DATA:  Memory loss. EXAM: MRI HEAD WITHOUT CONTRAST MRA HEAD WITHOUT CONTRAST TECHNIQUE: Multiplanar, multi-echo pulse sequences of the brain and surrounding structures were acquired without intravenous contrast. Angiographic images of the Circle of Willis were acquired using MRA technique without intravenous contrast. COMPARISON:  Head CT 04/24/2018.  Head MRI and MRA 09/14/2005. FINDINGS: MRI HEAD FINDINGS Brain: There is no evidence of an acute infarct, intracranial hemorrhage, mass, midline shift, or extra-axial fluid collection. Minimal T2 hyperintensities in the cerebral white matter bilaterally are within normal limits for age. Mild cerebral atrophy is without lobar predominance and does not appear advanced for age. A dilated perivascular space is again noted inferiorly in the left basal ganglia. Vascular: Major intracranial vascular flow voids are preserved. Skull and upper cervical spine: Unremarkable bone marrow signal. Sinuses/Orbits: Unremarkable orbits. Mild mucosal thickening in the paranasal sinuses. Clear mastoid air cells. Other: None. MRA HEAD FINDINGS Anterior circulation: The internal carotid arteries are widely patent from skull base to carotid termini. ACAs and MCAs are patent with attenuation of the distal branch vessels but no evidence of a proximal branch occlusion or significant proximal stenosis. No aneurysm is identified. Posterior circulation: The intracranial vertebral arteries are patent to the basilar with the left being dominant. The right vertebral artery is  particularly small distal to the PICA origin. Patent PICA and SCA origins are seen bilaterally. The basilar artery is widely patent. There is a fetal origin of the left PCA. Both PCAs are patent without evidence of a significant proximal stenosis. No aneurysm is identified. Anatomic variants: Fetal left PCA. IMPRESSION: 1. Unremarkable appearance of the brain for age. 2. No large vessel occlusion, significant proximal stenosis, or aneurysm. Electronically Signed   By: Logan Bores M.D.   On: 05/13/2021 12:10   MR BRAIN WO CONTRAST  Result Date: 05/13/2021 CLINICAL DATA:  Memory loss. EXAM: MRI HEAD WITHOUT CONTRAST MRA HEAD WITHOUT CONTRAST TECHNIQUE: Multiplanar, multi-echo pulse sequences of the brain and surrounding structures were acquired without intravenous contrast.  Angiographic images of the Circle of Willis were acquired using MRA technique without intravenous contrast. COMPARISON:  Head CT 04/24/2018.  Head MRI and MRA 09/14/2005. FINDINGS: MRI HEAD FINDINGS Brain: There is no evidence of an acute infarct, intracranial hemorrhage, mass, midline shift, or extra-axial fluid collection. Minimal T2 hyperintensities in the cerebral white matter bilaterally are within normal limits for age. Mild cerebral atrophy is without lobar predominance and does not appear advanced for age. A dilated perivascular space is again noted inferiorly in the left basal ganglia. Vascular: Major intracranial vascular flow voids are preserved. Skull and upper cervical spine: Unremarkable bone marrow signal. Sinuses/Orbits: Unremarkable orbits. Mild mucosal thickening in the paranasal sinuses. Clear mastoid air cells. Other: None. MRA HEAD FINDINGS Anterior circulation: The internal carotid arteries are widely patent from skull base to carotid termini. ACAs and MCAs are patent with attenuation of the distal branch vessels but no evidence of a proximal branch occlusion or significant proximal stenosis. No aneurysm is identified.  Posterior circulation: The intracranial vertebral arteries are patent to the basilar with the left being dominant. The right vertebral artery is particularly small distal to the PICA origin. Patent PICA and SCA origins are seen bilaterally. The basilar artery is widely patent. There is a fetal origin of the left PCA. Both PCAs are patent without evidence of a significant proximal stenosis. No aneurysm is identified. Anatomic variants: Fetal left PCA. IMPRESSION: 1. Unremarkable appearance of the brain for age. 2. No large vessel occlusion, significant proximal stenosis, or aneurysm. Electronically Signed   By: Logan Bores M.D.   On: 05/13/2021 12:10    Assessment & Plan:   Frank [name] was seen today for hypertension and hyperlipidemia.  Diagnoses and all orders for this visit:  Essential hypertension- His blood pressure is well controlled. -     Basic metabolic panel; Future -     CBC with Differential/Platelet; Future -     CBC with Differential/Platelet -     Basic metabolic panel  Stage 3b chronic kidney disease (Frank [name])- His renal function is stable. -     Basic metabolic panel; Future -     CBC with Differential/Platelet; Future -     CBC with Differential/Platelet -     Basic metabolic panel  Thrombocytopenia ([place])- This is stable.  There is no history of bleeding or bruising.  Will continue to follow. -     CBC with Differential/Platelet; Future -     CBC with Differential/Platelet  Age-related osteoporosis without current pathological fracture -     Basic metabolic panel; Future -     VITAMIN D 25 Hydroxy (Vit-D Deficiency, Fractures); Future -     VITAMIN D 25 Hydroxy (Vit-D Deficiency, Fractures) -     Basic metabolic panel  Hyperlipidemia with target LDL less than 70- LDL goal achieved. Doing well on the statin  -     rosuvastatin (CRESTOR) 10 MG tablet; Take 1 tablet (10 mg total) by mouth daily.  Chronic sinusitis, unspecified location -     Ambulatory referral to ENT  Need  for hepatitis C screening test -     Hepatitis C antibody; Future -     Hepatitis C antibody   I have changed Frank Hoover. "Frank [name]"'s rosuvastatin. I am also having him maintain his Calcium Carbonate (CALCIUM 500 PO), VITAMIN D PO, Multiple Vitamins-Minerals (CENTRUM MULTI + OMEGA 3 PO), denosumab, esomeprazole, sildenafil, Eliquis, omega-3 acid ethyl esters, and solifenacin.  Meds ordered this encounter  Medications  rosuvastatin (CRESTOR) 10 MG tablet    Sig: Take 1 tablet (10 mg total) by mouth daily.    Dispense:  90 tablet    Refill:  1     Follow-up: Return in about 6 months (around 01/20/2022).  Scarlette Calico, MD

## 2021-07-20 NOTE — Patient Instructions (Signed)
Thrombocytopenia Thrombocytopenia is a condition in which there are a low number of platelets in the blood. Platelets are also called thrombocytes. Platelets are parts of blood that stick together and form a clot to help the body stop bleeding after an injury. If you have too few platelets, your blood may have trouble clotting. This may cause you to bleed and bruise very easily. Some cases of thrombocytopenia are mild while others are more severe. What are the causes? This condition is caused by a low number of platelets in your blood. There are three main reasons for this: Your body not making enough platelets. This may be caused by: Bone marrow diseases. This include aplastic anemia, leukemia, and myelodysplastic anemia. Congenital thrombocytopenia. This is a condition that is passed from parent to child (inherited). Certain cancer treatments, including chemotherapy and radiation therapy. Infections from bacteria or viruses. Alcohol use disorder and alcoholism. Platelets not being released in the blood. This is called platelet sequestration and it can happen due to: An overactive spleen (hypersplenism). The spleen gathers up platelets from circulation, meaning that the platelets are not available to help with clotting your blood. The spleen can be enlarged because of scarring or other conditions. Gaucher disease. Your body destroying platelets too quickly. This may be caused by: An autoimmune disease that causes immune thrombocytopenia (ITP). ITP is sometimes associated with other autoimmune conditions such as lupus. Certain medicines, such as blood thinners. Certain blood clotting or bleeding disorders. Exposure to toxic chemicals, such as pesticides, lead, benzene, and arsenic. Pregnancy. What are the signs or symptoms? Symptoms of this condition are the result of poor blood clotting. They will vary depending on how low the platelet counts are. Symptoms may include: Bruising  easily. Bleeding from the mouth or nose. Heavy menstrual periods. Blood in the urine, stool (feces), or vomit. Purplish-red discolorations on the skin (purpura). A rash that looks like pinpoint, purplish-red spots (petechiae) on the lower legs. How is this diagnosed?  This condition may be diagnosed with blood tests and a physical exam. You may also have other tests, including: A sample of bone marrow (biopsy) may be removed to look for the original cells that make platelets. An ultrasound or CT scan of the abdomen to check for an enlarged spleen, enlarged lymph nodes, or liver problems. How is this treated? Treatment for this condition depends on the cause. Treatment may include: Treatment of another condition that is causing the low platelet count. Medicines to help protect your platelets from being destroyed. A replacement (transfusion) of platelets to stop or prevent bleeding. Surgery to remove the spleen. Follow these instructions at home: Medicines Take over-the-counter and prescription medicines only as told by your health care provider. Do not take any medicines that contain aspirin or NSAIDs, such as ibuprofen. These medicines increase your risk for dangerous bleeding. Activity Avoid activities that could cause injury or bruising, and follow instructions about how to prevent falls. Do not play contact sports. Ask your health care provider what activities are safe for you. Take extra care to protect yourself from burns when ironing or cooking. Take extra care not to cut yourself when you shave or when you use scissors, needles, knives, and other tools. General instructions  Check your skin and the inside of your mouth for bruising or bleeding as told by your health care provider. Wear a medical alert bracelet that says that you have a bleeding disorder. This can help you get the treatment you need in case of emergency. Check  your urine and stool for blood as told by your health  care provider. Do not drink alcohol. If you do drink alcohol, limit the amount that you drink. Minimize contact with toxic chemicals. Tell all your health care providers, including dental care providers and eye doctors, about your condition. Make sure to tell dental care providers before you have any procedure done, including dental cleanings. Keep all follow-up visits. This is important. Contact a health care provider if: You have unexplained bruising. You have new symptoms. You have symptoms that get worse. You have a fever. Get help right away if: You have severe bleeding from anywhere on your body. You have blood in your vomit, urine, or stool. You have an injury to your head. You have a sudden, severe headache. Summary Thrombocytopenia is a condition in which you have a low number of platelets in the blood. Platelets are parts of blood that stick together to form a clot. Symptoms of this condition are the result of poor blood clotting and may include bruising easily, bleeding from the nose or mouth, petechiae, and purpura. This condition may be diagnosed with blood tests and a physical exam. Treatment for this condition depends on the cause. This information is not intended to replace advice given to you by your health care provider. Make sure you discuss any questions you have with your health care provider. Document Revised: 07/31/2020 Document Reviewed: 07/31/2020 [employer] Patient Education  [name]

## 2021-07-21 LAB — HEPATITIS C ANTIBODY
Hepatitis C Ab: NONREACTIVE
SIGNAL TO CUT-OFF: 0.17 (ref <1.00)

## 2021-07-28 ENCOUNTER — Ambulatory Visit (INDEPENDENT_AMBULATORY_CARE_PROVIDER_SITE_OTHER): Payer: Medicare Other | Admitting: Psychology

## 2021-07-28 ENCOUNTER — Encounter: Payer: Self-pay | Admitting: Psychology

## 2021-07-28 ENCOUNTER — Ambulatory Visit: Payer: Medicare Other | Admitting: Psychology

## 2021-07-28 DIAGNOSIS — I6781 Acute cerebrovascular insufficiency: Secondary | ICD-10-CM

## 2021-07-28 DIAGNOSIS — R4189 Other symptoms and signs involving cognitive functions and awareness: Secondary | ICD-10-CM | POA: Diagnosis not present

## 2021-07-28 DIAGNOSIS — Z634 Disappearance and death of family member: Secondary | ICD-10-CM | POA: Diagnosis not present

## 2021-07-28 NOTE — Progress Notes (Signed)
   Psychometrician Note   Cognitive testing was administered to [name] by Milana Kidney, B.S. (psychometrist) under the supervision of Dr. Christia Reading, Ph.D., licensed psychologist on 07/28/2021. Frank [name] did not appear overtly distressed by the testing session per behavioral observation or responses across self-report questionnaires. Rest breaks were offered.    The battery of tests administered was selected by Dr. Christia Reading, Ph.D. with consideration to Frank [name]'s current level of functioning, the nature of his symptoms, emotional and behavioral responses during interview, level of literacy, observed level of motivation/effort, and the nature of the referral question. This battery was communicated to the psychometrist. Communication between Dr. Christia Reading, Ph.D. and the psychometrist was ongoing throughout the evaluation and Dr. Christia Reading, Ph.D. was immediately accessible at all times. Dr. Christia Reading, Ph.D. provided supervision to the psychometrist on the date of this service to the extent necessary to assure the quality of all services provided.    [name] will return within approximately 1-2 weeks for an interactive feedback session with Dr. Melvyn Novas at which time his test performances, clinical impressions, and treatment recommendations will be reviewed in detail. Frank [name] understands he can contact our office should he require our assistance before this time.  A total of 135 minutes of billable time were spent face-to-face with Frank [name] by the psychometrist. This includes both test administration and scoring time. Billing for these services is reflected in the clinical report generated by Dr. Christia Reading, Ph.D.  This note reflects time spent with the psychometrician and does not include test scores or any clinical interpretations made by Dr. Melvyn Novas. The full report will follow in a separate note.

## 2021-07-28 NOTE — Progress Notes (Unsigned)
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 evaluation for the purposes previously outlined by the referral source and is in no way reflective of a forensic evaluation.   [name] Ander was provided with a verbal description of the nature and purpose of the present neuropsychological evaluation. Also reviewed were the foreseeable risks and/or discomforts and benefits of the procedure, limits of confidentiality, and mandatory reporting requirements of this provider. The patient was given the opportunity to ask questions and receive answers about the evaluation. Oral consent to participate was provided by the patient.   This evaluation was conducted by Christia Reading, Ph.D., ABPP-CN, board certified clinical neuropsychologist. [name] Sayed completed a clinical interview with Dr. Melvyn Novas, billed as one unit [phone], and 135 minutes of cognitive testing and scoring, billed as one unit [phone] and four additional units [id number]. Psychometrist Milana Kidney, B.S., assisted Dr. Melvyn Novas with test administration and scoring procedures. As a separate and discrete service, Dr. Melvyn Novas spent a total of 160 minutes in interpretation and report writing billed as one  unit [id number] and two units [id number].

## 2021-07-29 NOTE — Progress Notes (Signed)
Remote pacemaker transmission.   

## 2021-08-03 ENCOUNTER — Encounter: Payer: Medicare Other | Admitting: Psychology

## 2021-08-11 ENCOUNTER — Telehealth: Payer: Self-pay | Admitting: Oncology

## 2021-08-11 ENCOUNTER — Ambulatory Visit: Payer: Medicare Other | Admitting: Psychology

## 2021-08-11 DIAGNOSIS — I6781 Acute cerebrovascular insufficiency: Secondary | ICD-10-CM

## 2021-08-11 DIAGNOSIS — R4189 Other symptoms and signs involving cognitive functions and awareness: Secondary | ICD-10-CM | POA: Diagnosis not present

## 2021-08-11 NOTE — Telephone Encounter (Signed)
Called patient regarding upcoming July appointments, patient is notified.

## 2021-08-11 NOTE — Progress Notes (Signed)
   Neuropsychology Feedback Session [name] [facility]  Reason for Referral:   [name] is a 81 y.o. right-handed Caucasian male referred by Metta Clines, D.O., to characterize his current cognitive functioning and assist with diagnostic clarity and treatment planning in the context of subjective cognitive decline.   Feedback:   [name] completed a comprehensive neuropsychological evaluation on 07/28/2021. Please refer to that encounter for the full report and recommendations. Briefly, results suggested neuropsychological functioning largely within normal limits relative to age-matched peers. An isolated impairment was exhibited across a line orientation task; however, all other visuospatial tasks were appropriate. A relative weakness was also exhibited across encoding (i.e., learning) aspects of memory, while very mild variability was exhibited across executive functioning. However, delayed retrieval and recognition/consolidation aspects of verbal and visual memory were consistently appropriate. Across mood-related questionnaires, he generally denied acute symptoms of psychiatric distress occurring during the past 1-2 weeks. However, during interview, he did report ongoing grief surrounding the passing of his daughter just over one year ago, noting that these feelings and emotions are something that weigh on him on a daily basis. It certainly remains possible that subjective day-to-day dysfunction is related to ongoing bereavement, coupled with mild cerebrovascular changes seen upon recent neuroimaging.   [name] was accompanied by his wife during the current feedback session. Content of the current session focused on the results of his neuropsychological evaluation. [name] was given the opportunity to ask questions and his questions were answered. He was encouraged to reach out should additional questions arise. A copy of his report was  provided at the conclusion of the visit.      25 minutes were spent conducting the current feedback session with [name], billed as one unit [phone].

## 2021-08-13 DIAGNOSIS — C642 Malignant neoplasm of left kidney, except renal pelvis: Secondary | ICD-10-CM | POA: Diagnosis not present

## 2021-08-14 DIAGNOSIS — C642 Malignant neoplasm of left kidney, except renal pelvis: Secondary | ICD-10-CM | POA: Diagnosis not present

## 2021-08-14 DIAGNOSIS — N2889 Other specified disorders of kidney and ureter: Secondary | ICD-10-CM | POA: Diagnosis not present

## 2021-08-14 DIAGNOSIS — J439 Emphysema, unspecified: Secondary | ICD-10-CM | POA: Diagnosis not present

## 2021-08-14 DIAGNOSIS — R918 Other nonspecific abnormal finding of lung field: Secondary | ICD-10-CM | POA: Diagnosis not present

## 2021-08-20 DIAGNOSIS — C642 Malignant neoplasm of left kidney, except renal pelvis: Secondary | ICD-10-CM | POA: Diagnosis not present

## 2021-08-20 DIAGNOSIS — C7801 Secondary malignant neoplasm of right lung: Secondary | ICD-10-CM | POA: Diagnosis not present

## 2021-08-20 DIAGNOSIS — C7802 Secondary malignant neoplasm of left lung: Secondary | ICD-10-CM | POA: Diagnosis not present

## 2021-09-03 ENCOUNTER — Inpatient Hospital Stay: Payer: Medicare Other | Attending: Oncology | Admitting: Oncology

## 2021-09-03 ENCOUNTER — Other Ambulatory Visit: Payer: Self-pay

## 2021-09-03 VITALS — BP 116/62 | HR 61 | Temp 98.1°F | Resp 17 | Ht 65.0 in | Wt 181.6 lb

## 2021-09-03 DIAGNOSIS — C7800 Secondary malignant neoplasm of unspecified lung: Secondary | ICD-10-CM | POA: Diagnosis not present

## 2021-09-03 DIAGNOSIS — Z79899 Other long term (current) drug therapy: Secondary | ICD-10-CM | POA: Diagnosis not present

## 2021-09-03 DIAGNOSIS — Z905 Acquired absence of kidney: Secondary | ICD-10-CM | POA: Diagnosis not present

## 2021-09-03 DIAGNOSIS — C7970 Secondary malignant neoplasm of unspecified adrenal gland: Secondary | ICD-10-CM | POA: Diagnosis not present

## 2021-09-03 DIAGNOSIS — C642 Malignant neoplasm of left kidney, except renal pelvis: Secondary | ICD-10-CM | POA: Diagnosis not present

## 2021-09-03 DIAGNOSIS — Z7901 Long term (current) use of anticoagulants: Secondary | ICD-10-CM | POA: Insufficient documentation

## 2021-09-03 MED ORDER — PROCHLORPERAZINE MALEATE 10 MG PO TABS: 10.0000 mg | ORAL_TABLET | Freq: Four times a day (QID) | ORAL | 0 refills | Status: DC | PRN

## 2021-09-03 NOTE — Progress Notes (Signed)
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

## 2021-09-03 NOTE — Progress Notes (Signed)
Hematology and Oncology Follow Up   Frank [name] [id number] Aug 02, 1940 81 y.o. 09/03/2021 10:27 AM Frank [name], MDJones, Frank Right, MD      Principle Diagnosis: 81 year old man with kidney cancer diagnosed in 2020.  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   Prior Therapy:  He is status post left radical nephrectomy completed by Dr. Gloriann [name] on October 18, 2018.  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  Current therapy: Under consideration to start therapy.  Interim History: Frank [name] is here for repeat follow-up.  Since last visit, he reports no major changes in his health.  He denies any worsening pulmonary symptoms at this time.  He does report some occasional cough but no shortness of breath or wheezing.  He denies any bone pain or pathological fractures.  His performance status quality of life remained reasonable.    Medications: Reviewed without changes. Current Outpatient Medications  Medication Sig Dispense Refill   apixaban (ELIQUIS) 5 MG TABS tablet Take 1 tablet by mouth twice daily 180 tablet 1   Calcium Carbonate (CALCIUM 500 PO) Take 500 mg by mouth 2 (two) times daily.     denosumab (PROLIA) 60 MG/ML SOSY injection Inject 60 mg into the skin every 6 (six) months.     esomeprazole (NEXIUM) 40 MG capsule Take 1 capsule (40 mg total) by mouth daily before breakfast. 90 capsule 3   Multiple Vitamins-Minerals (CENTRUM MULTI + OMEGA 3 PO) Take 1 tablet by mouth daily.      omega-3 acid ethyl esters (LOVAZA) 1 g capsule Take 2 capsules by mouth twice daily 360 capsule 1   rosuvastatin (CRESTOR) 10 MG tablet Take 1 tablet (10 mg total) by mouth daily. 90 tablet 1   sildenafil (VIAGRA) 100 MG tablet Take 100 mg by mouth daily as needed for erectile dysfunction.     solifenacin (VESICARE) 10 MG tablet Take 1 tablet by mouth once daily 90 tablet 0   VITAMIN  D PO Take 1,000 Units by mouth daily.     No current facility-administered medications for this visit.     Allergies:  Allergies  Allergen Reactions   Hydrocodone Other (See Comments)    Caused confusion    Monosodium Glutamate Other (See Comments)    Caused headaches   Naproxen Sodium     Mouth blisters with prolonged use.  Left nephrectomy 2020   Other     Perfume - headaches    Physical exam:     Blood pressure 116/62, pulse 61, temperature 98.1 F (36.7 C), temperature source Temporal, resp. rate 17, height 5\' 5"  (1.651 m), weight 181 lb 9.6 oz (82.4 kg), SpO2 97 %.    ECOG 1    General appearance: Alert, awake without any distress. Head: Atraumatic without abnormalities Oropharynx: Without any thrush or ulcers. Eyes: No scleral icterus. Lymph nodes: No lymphadenopathy noted in the cervical, supraclavicular, or axillary nodes Heart:regular rate and rhythm, without any murmurs or gallops.   Lung: Clear to auscultation without any rhonchi, wheezes or dullness to percussion. Abdomin: Soft, nontender without any shifting dullness or ascites. Musculoskeletal: No clubbing or cyanosis. Neurological: No motor or sensory deficits. Skin: No rashes or lesions.         Lab Results: Lab Results  Component Value Date   WBC 6.4 07/20/2021   HGB 13.1 07/20/2021   HCT 37.9 (L) 07/20/2021   MCV  96.2 07/20/2021   PLT 143.0 (L) 07/20/2021   PSA 0.10 01/17/2020     Chemistry      Component Value Date/Time   NA 136 07/20/2021 1100   NA 135 07/21/2020 1211   K 4.5 07/20/2021 1100   K 3.8 11/09/2013 0000   CL 102 07/20/2021 1100   CL 105 11/09/2013 0000   CO2 27 07/20/2021 1100   CO2 26 11/09/2013 0000   BUN 20 07/20/2021 1100   BUN 18 07/21/2020 1211   CREATININE 1.11 07/20/2021 1100   CREATININE 0.66 11/09/2013 0000      Component Value Date/Time   CALCIUM 9.2 07/20/2021 1100   CALCIUM 8.7 11/09/2013 0000   ALKPHOS 32 (L) 01/19/2021 1423   ALKPHOS 55  11/09/2013 0000   AST 21 01/19/2021 1423   AST 18 11/09/2013 0000   ALT 20 01/19/2021 1423   BILITOT 0.6 01/19/2021 1423   BILITOT 0.5 10/12/2017 0729   BILITOT 0.6 11/09/2013 0000        Impression and Plan:  81 year old with:   1.    Kidney cancer diagnosed in 2020.  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  CT scan obtained in June 2023 was personally reviewed and showed increase in his pulmonary nodules that are likely consistent with metastatic disease.  Treatment options at this time including oral targeted therapy, immunotherapy is single doublet or combination of the above were reiterated.  Complication with immunotherapy with ipilimumab and nivolumab were reviewed.  These include pneumonitis, colitis, thyroiditis and possible hepatitis were reiterated.  After discussion today he is agreeable to proceed with this treatment.  The plan is to complete 4 cycles of therapy before repeat imaging studies.  2.  Pulmonary nodules: Continues to increase and highly suggestive of metastatic disease.  Given the sarcomatoid nature of his tumor is less likely to be other etiology.  3.  [name] kidney mass: Unchanged at this time.  Continues to follow with Dr. Gloriann [name] regarding this issue.  4.  Antiemetics: Prescription for Compazine will be made available to him.  5.  Autoimmune complications: We will continue to monitor those.  Pneumonitis would be the most concerning and his respiratory status appeared reasonable.   6.  Follow-up: In the near future to start therapy.      30  minutes were dedicated to this encounter.  Time spent on reviewing imaging studies, treatment choices and outlining future plan of care review.   Frank Button, MD 09/03/2021 10:27 AM

## 2021-09-04 ENCOUNTER — Telehealth: Payer: Self-pay | Admitting: Oncology

## 2021-09-04 NOTE — Telephone Encounter (Signed)
Scheduled per 07/06 los, patient has been called and notified.  

## 2021-09-16 ENCOUNTER — Inpatient Hospital Stay: Payer: Medicare Other

## 2021-09-16 ENCOUNTER — Other Ambulatory Visit: Payer: Self-pay

## 2021-09-17 ENCOUNTER — Encounter: Payer: Self-pay | Admitting: Internal Medicine

## 2021-09-21 ENCOUNTER — Other Ambulatory Visit: Payer: Self-pay

## 2021-09-24 NOTE — Progress Notes (Signed)
Pharmacist Chemotherapy Monitoring - Initial Assessment    Anticipated start date: 10/01/21   The following has been reviewed per standard work regarding the patient's treatment regimen: The patient's diagnosis, treatment plan and drug doses, and organ/hematologic function Lab orders and baseline tests specific to treatment regimen  The treatment plan start date, drug sequencing, and pre-medications Prior authorization status  Patient's documented medication list, including drug-drug interaction screen and prescriptions for anti-emetics and supportive care specific to the treatment regimen The drug concentrations, fluid compatibility, administration routes, and timing of the medications to be used The patient's access for treatment and lifetime cumulative dose history, if applicable  The patient's medication allergies and previous infusion related reactions, if applicable   Changes made to treatment plan:  N/A  Follow up needed:  N/A   Larene Beach, RPH, 09/24/2021  9:55 AM

## 2021-10-01 ENCOUNTER — Inpatient Hospital Stay: Payer: Medicare Other | Attending: Oncology

## 2021-10-01 ENCOUNTER — Inpatient Hospital Stay: Payer: Medicare Other

## 2021-10-01 ENCOUNTER — Other Ambulatory Visit: Payer: Self-pay

## 2021-10-01 VITALS — BP 135/67 | HR 60 | Temp 97.6°F | Resp 18 | Ht 65.0 in | Wt 182.8 lb

## 2021-10-01 DIAGNOSIS — C7800 Secondary malignant neoplasm of unspecified lung: Secondary | ICD-10-CM | POA: Insufficient documentation

## 2021-10-01 DIAGNOSIS — Z5112 Encounter for antineoplastic immunotherapy: Secondary | ICD-10-CM | POA: Diagnosis not present

## 2021-10-01 DIAGNOSIS — Z7901 Long term (current) use of anticoagulants: Secondary | ICD-10-CM | POA: Diagnosis not present

## 2021-10-01 DIAGNOSIS — Z79899 Other long term (current) drug therapy: Secondary | ICD-10-CM | POA: Insufficient documentation

## 2021-10-01 DIAGNOSIS — C7970 Secondary malignant neoplasm of unspecified adrenal gland: Secondary | ICD-10-CM | POA: Diagnosis not present

## 2021-10-01 DIAGNOSIS — E079 Disorder of thyroid, unspecified: Secondary | ICD-10-CM | POA: Diagnosis not present

## 2021-10-01 DIAGNOSIS — C642 Malignant neoplasm of left kidney, except renal pelvis: Secondary | ICD-10-CM

## 2021-10-01 LAB — CBC WITH DIFFERENTIAL (CANCER CENTER ONLY)
Abs Immature Granulocytes: 0.03 10*3/uL (ref 0.00–0.07)
Basophils Absolute: 0 10*3/uL (ref 0.0–0.1)
Basophils Relative: 0 %
Eosinophils Absolute: 0.1 10*3/uL (ref 0.0–0.5)
Eosinophils Relative: 2 %
HCT: 38.4 % — ABNORMAL LOW (ref 39.0–52.0)
Hemoglobin: 13.5 g/dL (ref 13.0–17.0)
Immature Granulocytes: 1 %
Lymphocytes Relative: 18 %
Lymphs Abs: 0.9 10*3/uL (ref 0.7–4.0)
MCH: 33.8 pg (ref 26.0–34.0)
MCHC: 35.2 g/dL (ref 30.0–36.0)
MCV: 96 fL (ref 80.0–100.0)
Monocytes Absolute: 0.4 10*3/uL (ref 0.1–1.0)
Monocytes Relative: 8 %
Neutro Abs: 3.5 10*3/uL (ref 1.7–7.7)
Neutrophils Relative %: 71 %
Platelet Count: 93 10*3/uL — ABNORMAL LOW (ref 150–400)
RBC: 4 MIL/uL — ABNORMAL LOW (ref 4.22–5.81)
RDW: 14.3 % (ref 11.5–15.5)
WBC Count: 4.9 10*3/uL (ref 4.0–10.5)
nRBC: 0 % (ref 0.0–0.2)

## 2021-10-01 LAB — TSH: TSH: 1.481 u[IU]/mL (ref 0.350–4.500)

## 2021-10-01 LAB — CMP (CANCER CENTER ONLY)
ALT: 20 U/L (ref 0–44)
AST: 19 U/L (ref 15–41)
Albumin: 4.3 g/dL (ref 3.5–5.0)
Alkaline Phosphatase: 34 U/L — ABNORMAL LOW (ref 38–126)
Anion gap: 5 (ref 5–15)
BUN: 15 mg/dL (ref 8–23)
CO2: 26 mmol/L (ref 22–32)
Calcium: 9.1 mg/dL (ref 8.9–10.3)
Chloride: 105 mmol/L (ref 98–111)
Creatinine: 1.11 mg/dL (ref 0.61–1.24)
GFR, Estimated: >60 mL/min (ref >60)
Glucose, Bld: 87 mg/dL (ref 70–99)
Potassium: 4.2 mmol/L (ref 3.5–5.1)
Sodium: 136 mmol/L (ref 135–145)
Total Bilirubin: 0.5 mg/dL (ref 0.3–1.2)
Total Protein: 7.1 g/dL (ref 6.5–8.1)

## 2021-10-01 MED ORDER — FAMOTIDINE IN NACL 20-0.9 MG/50ML-% IV SOLN
20.0000 mg | Freq: Once | INTRAVENOUS | Status: AC
  Administered 2021-10-01: 20 mg via INTRAVENOUS
  Filled 2021-10-01: qty 50

## 2021-10-01 MED ORDER — SODIUM CHLORIDE 0.9 % IV SOLN
2.9100 mg/kg | Freq: Once | INTRAVENOUS | Status: AC
  Administered 2021-10-01: 240 mg via INTRAVENOUS
  Filled 2021-10-01: qty 24

## 2021-10-01 MED ORDER — SODIUM CHLORIDE 0.9 % IV SOLN: Freq: Once | INTRAVENOUS | Status: AC

## 2021-10-01 MED ORDER — DIPHENHYDRAMINE HCL 50 MG/ML IJ SOLN
25.0000 mg | Freq: Once | INTRAMUSCULAR | Status: AC
  Administered 2021-10-01: 25 mg via INTRAVENOUS
  Filled 2021-10-01: qty 1

## 2021-10-01 MED ORDER — SODIUM CHLORIDE 0.9 % IV SOLN
1.0000 mg/kg | Freq: Once | INTRAVENOUS | Status: AC
  Administered 2021-10-01: 80 mg via INTRAVENOUS
  Filled 2021-10-01: qty 16

## 2021-10-01 NOTE — Progress Notes (Signed)

## 2021-10-01 NOTE — Patient Instructions (Signed)
[facility]  Discharge Instructions: Thank you for choosing [facility] to provide your oncology and hematology care.   If you have a lab appointment with the [facility], please go directly to the [facility] and check in at the registration area.   Wear comfortable clothing and clothing appropriate for easy access to any Portacath or PICC line.   We strive to give you quality time with your provider. You may need to reschedule your appointment if you arrive late (15 or more minutes).  Arriving late affects you and other patients whose appointments are after yours.  Also, if you miss three or more appointments without notifying the office, you may be dismissed from the clinic at the provider's discretion.      For prescription refill requests, have your pharmacy contact our office and allow 72 hours for refills to be completed.    Today you received the following chemotherapy and/or immunotherapy agents Nivolumab and Ipilimumab      To help prevent nausea and vomiting after your treatment, we encourage you to take your nausea medication as directed.  BELOW ARE SYMPTOMS THAT SHOULD BE REPORTED IMMEDIATELY: *FEVER GREATER THAN 100.4 F (38 C) OR HIGHER *CHILLS OR SWEATING *NAUSEA AND VOMITING THAT IS NOT CONTROLLED WITH YOUR NAUSEA MEDICATION *UNUSUAL SHORTNESS OF BREATH *UNUSUAL BRUISING OR BLEEDING *URINARY PROBLEMS (pain or burning when urinating, or frequent urination) *BOWEL PROBLEMS (unusual diarrhea, constipation, pain near the anus) TENDERNESS IN MOUTH AND THROAT WITH OR WITHOUT PRESENCE OF ULCERS (sore throat, sores in mouth, or a toothache) UNUSUAL RASH, SWELLING OR PAIN  UNUSUAL VAGINAL DISCHARGE OR ITCHING   Items with * indicate a potential emergency and should be followed up as soon as possible or go to the Emergency Department if any problems should occur.  Please show the CHEMOTHERAPY ALERT CARD or IMMUNOTHERAPY ALERT CARD  at check-in to the Emergency Department and triage nurse.  Should you have questions after your visit or need to cancel or reschedule your appointment, please contact [facility]  Dept: [phone]  and follow the prompts.  Office hours are 8:00 a.m. to 4:30 p.m. Monday - Friday. Please note that voicemails left after 4:00 p.m. may not be returned until the following business day.  We are closed weekends and major holidays. You have access to a nurse at all times for urgent questions. Please call the main number to the clinic Dept: [phone] and follow the prompts.   For any non-urgent questions, you may also contact your provider using MyChart. We now offer e-Visits for anyone 79 and older to request care online for non-urgent symptoms. For details visit [email or web address].   Also download the MyChart app! Go to the app store, search "MyChart", open the app, select [facility], and log in with your MyChart username and password.  Masks are optional in the cancer centers. If you would like for your care team to wear a mask while they are taking care of you, please let them know. You may have one support person who is at least 80 years old accompany you for your appointments. Nivolumab Injection What is this medication? NIVOLUMAB (nye VOL ue mab) treats some types of cancer. It works by helping your immune system slow or stop the spread of cancer cells. It is a monoclonal antibody. This medicine may be used for other purposes; ask your health care provider or pharmacist if you have questions. COMMON BRAND NAME(S): Opdivo What  should I tell my care team before I take this medication? They need to know if you have any of these conditions: Allogeneic stem cell transplant (uses someone else's stem cells) Autoimmune diseases, such as Crohn disease, ulcerative colitis, lupus History of chest radiation Nervous system problems, such as Guillain-Barre syndrome or  myasthenia gravis Organ transplant An unusual or allergic reaction to nivolumab, other medications, foods, dyes, or preservatives Pregnant or trying to get pregnant Breast-feeding How should I use this medication? This medication is infused into a vein. It is given in a hospital or clinic setting. A special MedGuide will be given to you before each treatment. Be sure to read this information carefully each time. Talk to your care team about the use of this medication in children. While it may be prescribed for children as young as 12 years for selected conditions, precautions do apply. Overdosage: If you think you have taken too much of this medicine contact a poison control center or emergency room at once. NOTE: This medicine is only for you. Do not share this medicine with others. What if I miss a dose? Keep appointments for follow-up doses. It is important not to miss your dose. Call your care team if you are unable to keep an appointment. What may interact with this medication? Interactions have not been studied. This list may not describe all possible interactions. Give your health care provider a list of all the medicines, herbs, non-prescription drugs, or dietary supplements you use. Also tell them if you smoke, drink alcohol, or use illegal drugs. Some items may interact with your medicine. What should I watch for while using this medication? Your condition will be monitored carefully while you are receiving this medication. You may need blood work while taking this medication. This medication may cause serious skin reactions. They can happen weeks to months after starting the medication. Contact your care team right away if you notice fevers or flu-like symptoms with a rash. The rash may be red or purple and then turn into blisters or peeling of the skin. You may also notice a red rash with swelling of the face, lips, or lymph nodes in your neck or under your arms. Tell your care team  right away if you have any change in your eyesight. Talk to your care team if you are pregnant or think you might be pregnant. A negative pregnancy test is required before starting this medication. A reliable form of contraception is recommended while taking this medication and for 5 months after the last dose. Talk to your care team about effective forms of contraception. Do not breast-feed while taking this medication and for 5 months after the last dose. What side effects may I notice from receiving this medication? Side effects that you should report to your care team as soon as possible: Allergic reactions--skin rash, itching, hives, swelling of the face, lips, tongue, or throat Dry cough, shortness of breath or trouble breathing Eye pain, redness, irritation, or discharge with blurry or decreased vision Heart muscle inflammation--unusual weakness or fatigue, shortness of breath, chest pain, fast or irregular heartbeat, dizziness, swelling of the ankles, feet, or hands Hormone gland problems--headache, sensitivity to light, unusual weakness or fatigue, dizziness, fast or irregular heartbeat, increased sensitivity to cold or heat, excessive sweating, constipation, hair loss, increased thirst or amount of urine, tremors or shaking, irritability Infusion reactions--chest pain, shortness of breath or trouble breathing, feeling faint or lightheaded Kidney injury (glomerulonephritis)--decrease in the amount of urine, red or dark  brown urine, foamy or bubbly urine, swelling of the ankles, hands, or feet Liver injury--right upper belly pain, loss of appetite, nausea, light-colored stool, dark yellow or brown urine, yellowing skin or eyes, unusual weakness or fatigue Pain, tingling, or numbness in the hands or feet, muscle weakness, change in vision, confusion or trouble speaking, loss of balance or coordination, trouble walking, seizures Rash, fever, and swollen lymph nodes Redness, blistering, peeling,  or loosening of the skin, including inside the mouth Sudden or severe stomach pain, bloody diarrhea, fever, nausea, vomiting Side effects that usually do not require medical attention (report these to your care team if they continue or are bothersome): Bone, joint, or muscle pain Diarrhea Fatigue Loss of appetite Nausea Skin rash This list may not describe all possible side effects. Call your doctor for medical advice about side effects. You may report side effects to FDA at 1-800-FDA-1088. Where should I keep my medication? This medication is given in a hospital or clinic. It will not be stored at home. NOTE: This sheet is a summary. It may not cover all possible information. If you have questions about this medicine, talk to your doctor, pharmacist, or health care provider.  2023 Elsevier/Gold Standard (2021-01-16 00:00:00) Ipilimumab Injection What is this medication? IPILIMUMAB (IP i LIM ue mab) treats some types of cancer. It works by helping your immune system slow or stop the spread of cancer cells. It is a monoclonal antibody. This medicine may be used for other purposes; ask your health care provider or pharmacist if you have questions. COMMON BRAND NAME(S): YERVOY What should I tell my care team before I take this medication? They need to know if you have any of these conditions: Allogeneic stem cell transplant (uses someone else's stem cells) Autoimmune diseases, such as Crohn disease, ulcerative colitis, lupus Nervous system problems, such as Guillain-Barre syndrome or myasthenia gravis Organ transplant An unusual or allergic reaction to ipilimumab, other medications, foods, dyes, or preservatives Pregnant or trying to get pregnant Breast-feeding How should I use this medication? This medication is infused into a vein. It is given by your care team in a hospital or clinic setting. A special MedGuide will be given to you before each treatment. Be sure to read this  information carefully each time. Talk to your care team about the use of this medication in children. While it may be prescribed for children as young as 12 years for selected conditions, precautions do apply. Overdosage: If you think you have taken too much of this medicine contact a poison control center or emergency room at once. NOTE: This medicine is only for you. Do not share this medicine with others. What if I miss a dose? Keep appointments for follow-up doses. It is important not to miss your dose. Call your care team if you are unable to keep an appointment. What may interact with this medication? Interactions are not expected. This list may not describe all possible interactions. Give your health care provider a list of all the medicines, herbs, non-prescription drugs, or dietary supplements you use. Also tell them if you smoke, drink alcohol, or use illegal drugs. Some items may interact with your medicine. What should I watch for while using this medication? Your condition will be monitored carefully while you are receiving this medication. You may need blood work while taking this medication. This medication may cause serious skin reactions. They can happen weeks to months after starting the medication. Contact your care team right away if you notice  fevers or flu-like symptoms with a rash. The rash may be red or purple and then turn into blisters or peeling of the skin. You may also notice a red rash with swelling of the face, lips, or lymph nodes in your neck or under your arms. Tell your care team right away if you have any change in your eyesight. Talk to your care team if you may be pregnant. Serious birth defects can occur if you take this medication during pregnancy and for 3 months after the last dose. You will need a negative pregnancy test before starting this medication. Contraception is recommended while taking this medication and for 3 months after the last dose. Your care  team can help you find the option that works for you. Do not breastfeed while taking this medication and for 3 months after the last dose. What side effects may I notice from receiving this medication? Side effects that you should report to your care team as soon as possible: Allergic reactions--skin rash, itching, hives, swelling of the face, lips, tongue, or throat Dry cough, shortness of breath or trouble breathing Eye pain, redness, irritation, or discharge with blurry or decreased vision Heart muscle inflammation--unusual weakness or fatigue, shortness of breath, chest pain, fast or irregular heartbeat, dizziness, swelling of the ankles, feet, or hands Hormone gland problems--headache, sensitivity to light, unusual weakness or fatigue, dizziness, fast or irregular heartbeat, increased sensitivity to cold or heat, excessive sweating, constipation, hair loss, increased thirst or amount of urine, tremors or shaking, irritability Infusion reactions--chest pain, shortness of breath or trouble breathing, feeling faint or lightheaded Kidney injury (glomerulonephritis)--decrease in the amount of urine, red or dark brown urine, foamy or bubbly urine, swelling of the ankles, hands, or feet Liver injury--right upper belly pain, loss of appetite, nausea, light-colored stool, dark yellow or brown urine, yellowing skin or eyes, unusual weakness or fatigue Pain, tingling, or numbness in the hands or feet, muscle weakness, change in vision, confusion or trouble speaking, loss of balance or coordination, trouble walking, seizures Rash, fever, and swollen lymph nodes Redness, blistering, peeling, or loosening of the skin, including inside the mouth Sudden or severe stomach pain, bloody diarrhea, fever, nausea, vomiting Side effects that usually do not require medical attention (report to your care team if they continue or are bothersome): Bone, joint, or muscle pain Diarrhea Fatigue Loss of  appetite Nausea Skin rash This list may not describe all possible side effects. Call your doctor for medical advice about side effects. You may report side effects to FDA at 1-800-FDA-1088. Where should I keep my medication? This medication is given in a hospital or clinic. It will not be stored at home. NOTE: This sheet is a summary. It may not cover all possible information. If you have questions about this medicine, talk to your doctor, pharmacist, or health care provider.  2023 Elsevier/Gold Standard (2021-06-30 00:00:00)

## 2021-10-09 ENCOUNTER — Other Ambulatory Visit: Payer: Self-pay | Admitting: Oncology

## 2021-10-09 DIAGNOSIS — C642 Malignant neoplasm of left kidney, except renal pelvis: Secondary | ICD-10-CM

## 2021-10-11 ENCOUNTER — Other Ambulatory Visit: Payer: Self-pay | Admitting: Oncology

## 2021-10-13 ENCOUNTER — Other Ambulatory Visit: Payer: Self-pay | Admitting: Internal Medicine

## 2021-10-13 ENCOUNTER — Other Ambulatory Visit: Payer: Self-pay | Admitting: Cardiovascular Disease

## 2021-10-13 DIAGNOSIS — I480 Paroxysmal atrial fibrillation: Secondary | ICD-10-CM

## 2021-10-13 NOTE — Telephone Encounter (Signed)
Prescription refill request for Eliquis received. Indication:Afib Last office visit:4/23 Scr:1.1 Age: 81 Weight:82.9 kg  Prescription refilled

## 2021-10-14 ENCOUNTER — Ambulatory Visit (INDEPENDENT_AMBULATORY_CARE_PROVIDER_SITE_OTHER): Payer: Medicare Other

## 2021-10-14 DIAGNOSIS — I495 Sick sinus syndrome: Secondary | ICD-10-CM

## 2021-10-14 LAB — CUP PACEART REMOTE DEVICE CHECK
Battery Remaining Longevity: 134 mo
Battery Voltage: 3.01 V
Brady Statistic AP VP Percent: 0.04 %
Brady Statistic AP VS Percent: 50.06 %
Brady Statistic AS VP Percent: 0.01 %
Brady Statistic AS VS Percent: 49.9 %
Brady Statistic RA Percent Paced: 50.17 %
Brady Statistic RV Percent Paced: 0.05 %
Date Time Interrogation Session: 20230816032621
Implantable Lead Implant Date: 20200519
Implantable Lead Implant Date: 20200519
Implantable Lead Location: 753859
Implantable Lead Location: 753860
Implantable Lead Model: 5076
Implantable Lead Model: 5076
Implantable Pulse Generator Implant Date: 20200519
Lead Channel Impedance Value: 304 Ohm
Lead Channel Impedance Value: 380 Ohm
Lead Channel Impedance Value: 513 Ohm
Lead Channel Impedance Value: 532 Ohm
Lead Channel Pacing Threshold Amplitude: 0.5 V
Lead Channel Pacing Threshold Amplitude: 0.5 V
Lead Channel Pacing Threshold Pulse Width: 0.4 ms
Lead Channel Pacing Threshold Pulse Width: 0.4 ms
Lead Channel Sensing Intrinsic Amplitude: 3.625 mV
Lead Channel Sensing Intrinsic Amplitude: 3.625 mV
Lead Channel Sensing Intrinsic Amplitude: 5.625 mV
Lead Channel Sensing Intrinsic Amplitude: 5.625 mV
Lead Channel Setting Pacing Amplitude: 1.5 V
Lead Channel Setting Pacing Amplitude: 2.5 V
Lead Channel Setting Pacing Pulse Width: 0.4 ms
Lead Channel Setting Sensing Sensitivity: 2 mV

## 2021-10-16 DIAGNOSIS — J3489 Other specified disorders of nose and nasal sinuses: Secondary | ICD-10-CM | POA: Diagnosis not present

## 2021-10-19 ENCOUNTER — Telehealth: Payer: Self-pay | Admitting: *Deleted

## 2021-10-19 NOTE — Telephone Encounter (Signed)
Frank [name] states he has a dental cleaning on 8/28 and they might give him numbing med to fix a small spot on tooth. Has treatment (Opdivo and Yervoy) on 8/24. Wants to know if he is OK to go to the dentist.

## 2021-10-20 ENCOUNTER — Encounter: Payer: Self-pay | Admitting: Oncology

## 2021-10-20 NOTE — Telephone Encounter (Signed)
Notified of message below

## 2021-10-21 ENCOUNTER — Other Ambulatory Visit: Payer: Self-pay | Admitting: Internal Medicine

## 2021-10-21 DIAGNOSIS — E785 Hyperlipidemia, unspecified: Secondary | ICD-10-CM

## 2021-10-22 ENCOUNTER — Other Ambulatory Visit: Payer: Self-pay

## 2021-10-22 ENCOUNTER — Inpatient Hospital Stay: Payer: Medicare Other

## 2021-10-22 ENCOUNTER — Inpatient Hospital Stay: Payer: Medicare Other | Admitting: Oncology

## 2021-10-22 VITALS — BP 123/63 | HR 71 | Temp 97.8°F | Resp 17 | Ht 65.0 in | Wt 180.0 lb

## 2021-10-22 DIAGNOSIS — C642 Malignant neoplasm of left kidney, except renal pelvis: Secondary | ICD-10-CM

## 2021-10-22 DIAGNOSIS — C7800 Secondary malignant neoplasm of unspecified lung: Secondary | ICD-10-CM | POA: Diagnosis not present

## 2021-10-22 DIAGNOSIS — C7970 Secondary malignant neoplasm of unspecified adrenal gland: Secondary | ICD-10-CM | POA: Diagnosis not present

## 2021-10-22 DIAGNOSIS — E079 Disorder of thyroid, unspecified: Secondary | ICD-10-CM | POA: Diagnosis not present

## 2021-10-22 DIAGNOSIS — Z7901 Long term (current) use of anticoagulants: Secondary | ICD-10-CM | POA: Diagnosis not present

## 2021-10-22 DIAGNOSIS — Z79899 Other long term (current) drug therapy: Secondary | ICD-10-CM | POA: Diagnosis not present

## 2021-10-22 DIAGNOSIS — Z5112 Encounter for antineoplastic immunotherapy: Secondary | ICD-10-CM | POA: Diagnosis not present

## 2021-10-22 LAB — CMP (CANCER CENTER ONLY)
ALT: 17 U/L (ref 0–44)
AST: 16 U/L (ref 15–41)
Albumin: 3.9 g/dL (ref 3.5–5.0)
Alkaline Phosphatase: 43 U/L (ref 38–126)
Anion gap: 4 — ABNORMAL LOW (ref 5–15)
BUN: 14 mg/dL (ref 8–23)
CO2: 27 mmol/L (ref 22–32)
Calcium: 9.5 mg/dL (ref 8.9–10.3)
Chloride: 102 mmol/L (ref 98–111)
Creatinine: 0.93 mg/dL (ref 0.61–1.24)
GFR, Estimated: >60 mL/min (ref >60)
Glucose, Bld: 143 mg/dL — ABNORMAL HIGH (ref 70–99)
Potassium: 4.2 mmol/L (ref 3.5–5.1)
Sodium: 133 mmol/L — ABNORMAL LOW (ref 135–145)
Total Bilirubin: 0.5 mg/dL (ref 0.3–1.2)
Total Protein: 6.9 g/dL (ref 6.5–8.1)

## 2021-10-22 LAB — CBC WITH DIFFERENTIAL (CANCER CENTER ONLY)
Abs Immature Granulocytes: 0.04 10*3/uL (ref 0.00–0.07)
Basophils Absolute: 0 10*3/uL (ref 0.0–0.1)
Basophils Relative: 0 %
Eosinophils Absolute: 0.1 10*3/uL (ref 0.0–0.5)
Eosinophils Relative: 1 %
HCT: 36 % — ABNORMAL LOW (ref 39.0–52.0)
Hemoglobin: 12.9 g/dL — ABNORMAL LOW (ref 13.0–17.0)
Immature Granulocytes: 1 %
Lymphocytes Relative: 12 %
Lymphs Abs: 0.7 10*3/uL (ref 0.7–4.0)
MCH: 33.7 pg (ref 26.0–34.0)
MCHC: 35.8 g/dL (ref 30.0–36.0)
MCV: 94 fL (ref 80.0–100.0)
Monocytes Absolute: 0.4 10*3/uL (ref 0.1–1.0)
Monocytes Relative: 7 %
Neutro Abs: 4.7 10*3/uL (ref 1.7–7.7)
Neutrophils Relative %: 79 %
Platelet Count: 110 10*3/uL — ABNORMAL LOW (ref 150–400)
RBC: 3.83 MIL/uL — ABNORMAL LOW (ref 4.22–5.81)
RDW: 13.5 % (ref 11.5–15.5)
WBC Count: 5.9 10*3/uL (ref 4.0–10.5)
nRBC: 0 % (ref 0.0–0.2)

## 2021-10-22 LAB — TSH: TSH: 1.511 u[IU]/mL (ref 0.350–4.500)

## 2021-10-22 MED ORDER — FAMOTIDINE IN NACL 20-0.9 MG/50ML-% IV SOLN
20.0000 mg | Freq: Once | INTRAVENOUS | Status: AC
  Administered 2021-10-22: 20 mg via INTRAVENOUS
  Filled 2021-10-22: qty 50

## 2021-10-22 MED ORDER — SODIUM CHLORIDE 0.9 % IV SOLN: Freq: Once | INTRAVENOUS | Status: AC

## 2021-10-22 MED ORDER — SODIUM CHLORIDE 0.9 % IV SOLN
80.0000 mg | Freq: Once | INTRAVENOUS | Status: AC
  Administered 2021-10-22: 80 mg via INTRAVENOUS
  Filled 2021-10-22: qty 16

## 2021-10-22 MED ORDER — SODIUM CHLORIDE 0.9 % IV SOLN
2.9000 mg/kg | Freq: Once | INTRAVENOUS | Status: AC
  Administered 2021-10-22: 240 mg via INTRAVENOUS
  Filled 2021-10-22: qty 24

## 2021-10-22 MED ORDER — DIPHENHYDRAMINE HCL 50 MG/ML IJ SOLN
25.0000 mg | Freq: Once | INTRAMUSCULAR | Status: AC
  Administered 2021-10-22: 25 mg via INTRAVENOUS
  Filled 2021-10-22: qty 1

## 2021-10-22 NOTE — Patient Instructions (Signed)
[facility]  Discharge Instructions: Thank you for choosing [facility] to provide your oncology and hematology care.   If you have a lab appointment with the [facility], please go directly to the [facility] and check in at the registration area.   Wear comfortable clothing and clothing appropriate for easy access to any Portacath or PICC line.   We strive to give you quality time with your provider. You may need to reschedule your appointment if you arrive late (15 or more minutes).  Arriving late affects you and other patients whose appointments are after yours.  Also, if you miss three or more appointments without notifying the office, you may be dismissed from the clinic at the provider's discretion.      For prescription refill requests, have your pharmacy contact our office and allow 72 hours for refills to be completed.    Today you received the following chemotherapy and/or immunotherapy agents Opdivo & Yervoy      To help prevent nausea and vomiting after your treatment, we encourage you to take your nausea medication as directed.  BELOW ARE SYMPTOMS THAT SHOULD BE REPORTED IMMEDIATELY: *FEVER GREATER THAN 100.4 F (38 C) OR HIGHER *CHILLS OR SWEATING *NAUSEA AND VOMITING THAT IS NOT CONTROLLED WITH YOUR NAUSEA MEDICATION *UNUSUAL SHORTNESS OF BREATH *UNUSUAL BRUISING OR BLEEDING *URINARY PROBLEMS (pain or burning when urinating, or frequent urination) *BOWEL PROBLEMS (unusual diarrhea, constipation, pain near the anus) TENDERNESS IN MOUTH AND THROAT WITH OR WITHOUT PRESENCE OF ULCERS (sore throat, sores in mouth, or a toothache) UNUSUAL RASH, SWELLING OR PAIN  UNUSUAL VAGINAL DISCHARGE OR ITCHING   Items with * indicate a potential emergency and should be followed up as soon as possible or go to the Emergency Department if any problems should occur.  Please show the CHEMOTHERAPY ALERT CARD or IMMUNOTHERAPY ALERT CARD at  check-in to the Emergency Department and triage nurse.  Should you have questions after your visit or need to cancel or reschedule your appointment, please contact [facility]  Dept: [phone]  and follow the prompts.  Office hours are 8:00 a.m. to 4:30 p.m. Monday - Friday. Please note that voicemails left after 4:00 p.m. may not be returned until the following business day.  We are closed weekends and major holidays. You have access to a nurse at all times for urgent questions. Please call the main number to the clinic Dept: [phone] and follow the prompts.   For any non-urgent questions, you may also contact your provider using MyChart. We now offer e-Visits for anyone 54 and older to request care online for non-urgent symptoms. For details visit [email or web address].   Also download the MyChart app! Go to the app store, search "MyChart", open the app, select [facility], and log in with your MyChart username and password.  Masks are optional in the cancer centers. If you would like for your care team to wear a mask while they are taking care of you, please let them know. You may have one support Frank [name] who is at least 80 years old accompany you for your appointments.

## 2021-10-22 NOTE — Progress Notes (Signed)
Ipilimumab (YERVOY) Patient Monitoring Assessment   Is the patient experiencing any of the following general symptoms?:  [ ] Difficulty performing normal activities [ ] Feeling sluggish or cold all the time [ ] Unusual weight gain [ ] Constant or unusual headaches [ ] Feeling dizzy or faint [ ] Changes in eyesight (blurry vision, double vision, or other vision problems) [ ] Changes in mood or behavior (ex: decreased sex drive, irritability, or forgetfulness) [ ] Starting new medications (ex: steroids, other medications that lower immune response) [X] Patient is not experiencing any of the general symptoms above.   Gastrointestinal  Patient is having 1-2 bowel movements each day.  Is this different from baseline? [ ] Yes [X] No Are your stools watery or do they have a foul smell? [ ] Yes [X] No Have you seen blood in your stools? [ ] Yes [X] No Are your stools dark, tarry, or sticky? [ ] Yes [X] No Are you having pain or tenderness in your belly? [ ] Yes [X] No  Skin Does your skin itch? [ ] Yes [X] No Do you have a rash? [ ] Yes [X] No Has your skin blistered and/or peeled? [ ] Yes [X] No Do you have sores in your mouth? [ ] Yes [X] No  Hepatic Has your urine been dark or tea colored? [ ] Yes [X] No Have you noticed that your skin or the whites of your eyes are turning yellow? [ ] Yes [X] No Are you bleeding or bruising more easily than normal? [ ] Yes [X] No Are you nauseous and/or vomiting? [ ] Yes [X] No Do you have pain on the right side of your stomach? [ ] Yes [X] No  Neurologic  Are you having unusual weakness of legs, arms, or face? [ ] Yes [X] No Are you having numbness or tingling in your hands or feet? [ ] Yes [X] No  Ramon Brant A Tempest [facility]

## 2021-10-22 NOTE — Progress Notes (Signed)
Hematology and Oncology Follow Up   Frank [name] [id number] 01-02-1941 81 y.o. 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  He presented withT2 tumor in 2020.   Prior Therapy:  He is status post left radical nephrectomy completed by Dr. Gloriann Loan on October 18, 2018.  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  Current therapy: Ipilimumab 1 mg/kg and nivolumab 3 mg/kg started on October 01, 2021.  He is here for cycle 2 of therapy.  Interim History: Mr. Rindfleisch returns today for repeat evaluation.  Since the last visit, he reports no major changes in his health.  He tolerated the first cycle of therapy without any complaints.  He denies any nausea, vomiting or abdominal pain.  He denies any recent hospitalizations or illnesses.  He denies any diarrhea, fatigue or skin rash.  His quality of life remains unchanged.    Medications: Updated on review. Current Outpatient Medications  Medication Sig Dispense Refill   apixaban (ELIQUIS) 5 MG TABS tablet Take 1 tablet by mouth twice daily 180 tablet 1   Calcium Carbonate (CALCIUM 500 PO) Take 500 mg by mouth 2 (two) times daily.     denosumab (PROLIA) 60 MG/ML SOSY injection Inject 60 mg into the skin every 6 (six) months.     esomeprazole (NEXIUM) 40 MG capsule Take 1 capsule (40 mg total) by mouth daily before breakfast. 90 capsule 3   Multiple Vitamins-Minerals (CENTRUM MULTI + OMEGA 3 PO) Take 1 tablet by mouth daily.      omega-3 acid ethyl esters (LOVAZA) 1 g capsule Take 2 capsules by mouth twice daily 360 capsule 1   prochlorperazine (COMPAZINE) 10 MG tablet Take 1 tablet (10 mg total) by mouth every 6 (six) hours as needed for nausea or vomiting. 30 tablet 0   rosuvastatin (CRESTOR) 10 MG tablet Take 1 tablet by mouth once daily 90 tablet 0   sildenafil (VIAGRA)  100 MG tablet Take 100 mg by mouth daily as needed for erectile dysfunction.     solifenacin (VESICARE) 10 MG tablet Take 1 tablet by mouth once daily 90 tablet 0   VITAMIN D PO Take 1,000 Units by mouth daily.     No current facility-administered medications for this visit.     Allergies:  Allergies  Allergen Reactions   Hydrocodone Other (See Comments)    Caused confusion    Monosodium Glutamate Other (See Comments)    Caused headaches   Naproxen Sodium     Mouth blisters with prolonged use.  Left nephrectomy 2020   Other     Perfume - headaches    Physical exam:     Blood pressure 123/63, pulse 71, temperature 97.8 F (36.6 C), temperature source Temporal, resp. rate 17, height 5\' 5"  (1.651 m), weight 180 lb (81.6 kg), SpO2 99 %.     ECOG 1   General appearance: Comfortable appearing without any discomfort Head: Normocephalic without any trauma Oropharynx: Mucous membranes are moist and pink without any thrush or ulcers. Eyes: Pupils are equal and round reactive to light. Lymph nodes: No cervical, supraclavicular, inguinal or axillary lymphadenopathy.   Heart:regular rate and rhythm.  S1 and S2 without leg edema. Lung: Clear without any rhonchi or wheezes.  No dullness to percussion. Abdomin: Soft, nontender, nondistended with good bowel sounds.  No hepatosplenomegaly. Musculoskeletal: No joint  deformity or effusion.  Full range of motion noted. Neurological: No deficits noted on motor, sensory and deep tendon reflex exam. Skin: No petechial rash or dryness.  Appeared moist.          Lab Results: Lab Results  Component Value Date   WBC 4.9 10/01/2021   HGB 13.5 10/01/2021   HCT 38.4 (L) 10/01/2021   MCV 96.0 10/01/2021   PLT 93 (L) 10/01/2021   PSA 0.10 01/17/2020     Chemistry      Component Value Date/Time   NA 136 10/01/2021 1139   NA 135 07/21/2020 1211   K 4.2 10/01/2021 1139   K 3.8 11/09/2013 0000   CL 105 10/01/2021 1139   CL 105  11/09/2013 0000   CO2 26 10/01/2021 1139   CO2 26 11/09/2013 0000   BUN 15 10/01/2021 1139   BUN 18 07/21/2020 1211   CREATININE 1.11 10/01/2021 1139   CREATININE 0.66 11/09/2013 0000      Component Value Date/Time   CALCIUM 9.1 10/01/2021 1139   CALCIUM 8.7 11/09/2013 0000   ALKPHOS 34 (L) 10/01/2021 1139   ALKPHOS 55 11/09/2013 0000   AST 19 10/01/2021 1139   ALT 20 10/01/2021 1139   BILITOT 0.5 10/01/2021 1139        Impression and Plan:  81 year old with:   1.  Stage IV clear-cell renal cell carcinoma with sarcomatoid features and pulmonary involvement documented in June 2023.  He is currently receiving ipilimumab and nivolumab and completed the first cycle of therapy.  Risks and benefits of proceeding with cycle 2 were discussed at this time.  Complications that include nausea, fatigue and immune mediated complications were reiterated.  He is agreeable to proceed.  2.  Pulmonary nodules: Likely represents metastatic disease.  We will reevaluate with repeat imaging studies in the future.  3.  [name] kidney mass: We will continue to follow with Dr. Gloriann Loan regarding this issue.  4.  Antiemetics: No nausea or vomiting reported at this time.  Compazine is available to him.  5.  Autoimmune complications: I continue to educate him about these issues including pneumonitis, colitis and thyroid disease.   6.  Follow-up: In 3 weeks for the next cycle of therapy.      30  minutes were spent on this visit.  The time was dedicated to reviewing treatment choices and addressing complications related to his treatment.  Zola Button, MD 10/22/2021 11:37 AM

## 2021-10-26 ENCOUNTER — Telehealth: Payer: Self-pay

## 2021-10-26 NOTE — Telephone Encounter (Signed)
T/C from pt stating he has been having a clear productive cough, sore throat and dry mouth. Possible allergies. No fever, nausea or vomiting.  Pt advised to try biotene for his dry mouth and to monitor for fever and worsening symptoms.  He has an ENT Dr and will f/up with them if his sore throat symptoms do not resolve.

## 2021-11-02 ENCOUNTER — Other Ambulatory Visit: Payer: Self-pay | Admitting: Internal Medicine

## 2021-11-02 DIAGNOSIS — E781 Pure hyperglyceridemia: Secondary | ICD-10-CM

## 2021-11-09 ENCOUNTER — Telehealth: Payer: Self-pay | Admitting: *Deleted

## 2021-11-09 NOTE — Telephone Encounter (Signed)
Notified to come in for appts on Thursday.

## 2021-11-09 NOTE — Telephone Encounter (Signed)
Frank [name] states he is still having dry mouth, tongue sore, cough and throat is sore. No fever. States he "is not going to call ENT, that was a waste of time".  Not feeling well, wants to know if he should come for treatment on Thursday. Encouraged to call ENT.

## 2021-11-10 DIAGNOSIS — J342 Deviated nasal septum: Secondary | ICD-10-CM | POA: Diagnosis not present

## 2021-11-10 DIAGNOSIS — T50905A Adverse effect of unspecified drugs, medicaments and biological substances, initial encounter: Secondary | ICD-10-CM | POA: Diagnosis not present

## 2021-11-10 DIAGNOSIS — Z9289 Personal history of other medical treatment: Secondary | ICD-10-CM | POA: Diagnosis not present

## 2021-11-10 DIAGNOSIS — K117 Disturbances of salivary secretion: Secondary | ICD-10-CM | POA: Diagnosis not present

## 2021-11-12 ENCOUNTER — Telehealth: Payer: Self-pay | Admitting: *Deleted

## 2021-11-12 ENCOUNTER — Inpatient Hospital Stay: Payer: Medicare Other | Attending: Oncology | Admitting: Oncology

## 2021-11-12 ENCOUNTER — Inpatient Hospital Stay: Payer: Medicare Other

## 2021-11-12 ENCOUNTER — Inpatient Hospital Stay (HOSPITAL_BASED_OUTPATIENT_CLINIC_OR_DEPARTMENT_OTHER): Payer: Medicare Other

## 2021-11-12 ENCOUNTER — Telehealth: Payer: Self-pay | Admitting: Internal Medicine

## 2021-11-12 VITALS — BP 127/73 | HR 80 | Temp 97.5°F | Resp 16 | Ht 65.0 in | Wt 175.3 lb

## 2021-11-12 DIAGNOSIS — C7970 Secondary malignant neoplasm of unspecified adrenal gland: Secondary | ICD-10-CM | POA: Insufficient documentation

## 2021-11-12 DIAGNOSIS — Z79899 Other long term (current) drug therapy: Secondary | ICD-10-CM | POA: Diagnosis not present

## 2021-11-12 DIAGNOSIS — R682 Dry mouth, unspecified: Secondary | ICD-10-CM | POA: Insufficient documentation

## 2021-11-12 DIAGNOSIS — C642 Malignant neoplasm of left kidney, except renal pelvis: Secondary | ICD-10-CM

## 2021-11-12 DIAGNOSIS — R059 Cough, unspecified: Secondary | ICD-10-CM | POA: Insufficient documentation

## 2021-11-12 DIAGNOSIS — Z7901 Long term (current) use of anticoagulants: Secondary | ICD-10-CM | POA: Insufficient documentation

## 2021-11-12 DIAGNOSIS — R490 Dysphonia: Secondary | ICD-10-CM | POA: Insufficient documentation

## 2021-11-12 DIAGNOSIS — C649 Malignant neoplasm of unspecified kidney, except renal pelvis: Secondary | ICD-10-CM | POA: Diagnosis not present

## 2021-11-12 DIAGNOSIS — C7800 Secondary malignant neoplasm of unspecified lung: Secondary | ICD-10-CM | POA: Diagnosis not present

## 2021-11-12 LAB — CMP (CANCER CENTER ONLY)
ALT: 35 U/L (ref 0–44)
AST: 27 U/L (ref 15–41)
Albumin: 3.9 g/dL (ref 3.5–5.0)
Alkaline Phosphatase: 42 U/L (ref 38–126)
Anion gap: 6 (ref 5–15)
BUN: 21 mg/dL (ref 8–23)
CO2: 26 mmol/L (ref 22–32)
Calcium: 9.8 mg/dL (ref 8.9–10.3)
Chloride: 95 mmol/L — ABNORMAL LOW (ref 98–111)
Creatinine: 0.86 mg/dL (ref 0.61–1.24)
GFR, Estimated: >60 mL/min (ref >60)
Glucose, Bld: 181 mg/dL — ABNORMAL HIGH (ref 70–99)
Potassium: 4.9 mmol/L (ref 3.5–5.1)
Sodium: 127 mmol/L — ABNORMAL LOW (ref 135–145)
Total Bilirubin: 0.8 mg/dL (ref 0.3–1.2)
Total Protein: 7.3 g/dL (ref 6.5–8.1)

## 2021-11-12 LAB — CBC WITH DIFFERENTIAL (CANCER CENTER ONLY)
Abs Immature Granulocytes: 0.08 10*3/uL — ABNORMAL HIGH (ref 0.00–0.07)
Basophils Absolute: 0 10*3/uL (ref 0.0–0.1)
Basophils Relative: 0 %
Eosinophils Absolute: 0.1 10*3/uL (ref 0.0–0.5)
Eosinophils Relative: 1 %
HCT: 34.8 % — ABNORMAL LOW (ref 39.0–52.0)
Hemoglobin: 12.3 g/dL — ABNORMAL LOW (ref 13.0–17.0)
Immature Granulocytes: 1 %
Lymphocytes Relative: 8 %
Lymphs Abs: 0.8 10*3/uL (ref 0.7–4.0)
MCH: 33.2 pg (ref 26.0–34.0)
MCHC: 35.3 g/dL (ref 30.0–36.0)
MCV: 93.8 fL (ref 80.0–100.0)
Monocytes Absolute: 0.7 10*3/uL (ref 0.1–1.0)
Monocytes Relative: 8 %
Neutro Abs: 7.8 10*3/uL — ABNORMAL HIGH (ref 1.7–7.7)
Neutrophils Relative %: 82 %
Platelet Count: 120 10*3/uL — ABNORMAL LOW (ref 150–400)
RBC: 3.71 MIL/uL — ABNORMAL LOW (ref 4.22–5.81)
RDW: 14 % (ref 11.5–15.5)
WBC Count: 9.4 10*3/uL (ref 4.0–10.5)
nRBC: 0 % (ref 0.0–0.2)

## 2021-11-12 LAB — TSH: TSH: 1.558 u[IU]/mL (ref 0.350–4.500)

## 2021-11-12 NOTE — Progress Notes (Signed)
Remote pacemaker transmission.   

## 2021-11-12 NOTE — Telephone Encounter (Signed)
PC to patient, spoke with his wife [name], informed her PA is not required for the CT which has been ordered & may be scheduled at any time.  Central Scheduling phone number given, she verbalizes understanding.

## 2021-11-12 NOTE — Telephone Encounter (Signed)
Cancer doctor is requesting that patient be given an inhaler for copd.  Last CT scan is on his chart.  Please send inhaler to [facility] in [place], [place]

## 2021-11-12 NOTE — Progress Notes (Signed)
Hematology and Oncology Follow Up   Frank [name] [id number] 1940/03/13 81 y.o. 11/12/2021 12:41 PM Frank [name], MDJones, Frank Right, MD      Principle Diagnosis: 81 year old man with kidney cancer diagnosed in 2020.  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     Prior Therapy:  He is status post left radical nephrectomy completed by Dr. Gloriann Loan on October 18, 2018.  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  Current therapy: Ipilimumab 1 mg/kg and nivolumab 3 mg/kg started on October 01, 2021.  He is here for cycle 3 of therapy.  Interim History: Frank [name] presents today for a follow-up.  Since last visit, he had reported complaints of mouth pain and dryness.  This resulted in more hoarseness and occasional cough.  He was evaluated by ENT and visualization of the his vocal cord showed overall normal erythema and dryness.  He reported no constitutional symptoms of fevers chills or sweats.  His appetite is down and he is bothered significantly by mouth dryness.    Medications: Reviewed without changes. Current Outpatient Medications  Medication Sig Dispense Refill   apixaban (ELIQUIS) 5 MG TABS tablet Take 1 tablet by mouth twice daily 180 tablet 1   Calcium Carbonate (CALCIUM 500 PO) Take 500 mg by mouth 2 (two) times daily.     denosumab (PROLIA) 60 MG/ML SOSY injection Inject 60 mg into the skin every 6 (six) months.     esomeprazole (NEXIUM) 40 MG capsule Take 1 capsule (40 mg total) by mouth daily before breakfast. 90 capsule 3   Multiple Vitamins-Minerals (CENTRUM MULTI + OMEGA 3 PO) Take 1 tablet by mouth daily.      omega-3 acid ethyl esters (LOVAZA) 1 g capsule Take 2 capsules by mouth twice daily 360 capsule 1   prochlorperazine (COMPAZINE) 10 MG tablet Take 1 tablet (10 mg total) by mouth every 6 (six) hours as needed for nausea or vomiting. 30 tablet 0   rosuvastatin  (CRESTOR) 10 MG tablet Take 1 tablet by mouth once daily 90 tablet 0   sildenafil (VIAGRA) 100 MG tablet Take 100 mg by mouth daily as needed for erectile dysfunction.     solifenacin (VESICARE) 10 MG tablet Take 1 tablet by mouth once daily 90 tablet 0   VITAMIN D PO Take 1,000 Units by mouth daily.     No current facility-administered medications for this visit.     Allergies:  Allergies  Allergen Reactions   Hydrocodone Other (See Comments)    Caused confusion    Monosodium Glutamate Other (See Comments)    Caused headaches   Naproxen Sodium     Mouth blisters with prolonged use.  Left nephrectomy 2020   Other     Perfume - headaches    Physical exam:     Blood pressure 127/73, pulse 80, temperature (!) 97.5 F (36.4 C), temperature source Oral, resp. rate 16, height 5\' 5"  (1.651 m), weight 175 lb 4.8 oz (79.5 kg), SpO2 96 %.      ECOG 1   General appearance: Alert, awake without any distress. Head: Atraumatic without abnormalities Oropharynx: Erythema noted without any ulcers or lesions. Eyes: No scleral icterus. Lymph nodes: No lymphadenopathy noted in the cervical, supraclavicular, or axillary nodes Heart:regular rate and rhythm, without any murmurs or gallops.   Lung: Clear to auscultation without any rhonchi, wheezes or dullness to percussion. Abdomin: Soft, nontender without any shifting  dullness or ascites. Musculoskeletal: No clubbing or cyanosis. Neurological: No motor or sensory deficits. Skin: No rashes or lesions.          Lab Results: Lab Results  Component Value Date   WBC 5.9 10/22/2021   HGB 12.9 (L) 10/22/2021   HCT 36.0 (L) 10/22/2021   MCV 94.0 10/22/2021   PLT 110 (L) 10/22/2021   PSA 0.10 01/17/2020     Chemistry      Component Value Date/Time   NA 133 (L) 10/22/2021 1141   NA 135 07/21/2020 1211   K 4.2 10/22/2021 1141   K 3.8 11/09/2013 0000   CL 102 10/22/2021 1141   CL 105 11/09/2013 0000   CO2 27 10/22/2021 1141    CO2 26 11/09/2013 0000   BUN 14 10/22/2021 1141   BUN 18 07/21/2020 1211   CREATININE 0.93 10/22/2021 1141   CREATININE 0.66 11/09/2013 0000      Component Value Date/Time   CALCIUM 9.5 10/22/2021 1141   CALCIUM 8.7 11/09/2013 0000   ALKPHOS 43 10/22/2021 1141   ALKPHOS 55 11/09/2013 0000   AST 16 10/22/2021 1141   ALT 17 10/22/2021 1141   BILITOT 0.5 10/22/2021 1141        Impression and Plan:  81 year old with:   1.  Kidney cancer diagnosed in 2020.  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  The natural course of this disease was reviewed at this time and treatment options were discussed.  He is currently on immunotherapy without any major complications.  Risks and benefits of proceeding with: 3 were discussed.  GI toxicity as well as autoimmune concerns were reiterated.  Alternative treatment options would be active surveillance and oral targeted therapy.  After discussion today, we opted to hold treatment given oropharyngeal erythema and dryness.  This appears to be autoimmune in nature and he is now willing to continue with treatment for the time being.  I recommended updating his staging scan before the next visit and we will proceed accordingly.  2.  Pulmonary nodules: We will update imaging studies after the first cycle of therapy to monitor changes in these pulmonary nodules.  3.  [name] kidney mass: Subsequent imaging studies will determine whether intervention is needed.  Follow-up with Dr. Gloriann Loan regarding this issue.  4.  Antiemetics: Compazine available to him without any nausea or vomiting.  5.  Autoimmune complications: I continue to educate him about potential complication clued pneumonitis, colitis and thyroid disease.   6.  Follow-up: He will return in 3 weeks for a repeat evaluation and imaging studies.      30  minutes were dedicated to this encounter.  Time was spent on  reviewing laboratory data, disease status update and outlining future plan of care discussion.  Zola Button, MD 11/12/2021 12:41 PM

## 2021-11-13 LAB — T4: T4, Total: 8.8 ug/dL (ref 4.5–12.0)

## 2021-11-14 ENCOUNTER — Other Ambulatory Visit: Payer: Self-pay | Admitting: Internal Medicine

## 2021-11-24 ENCOUNTER — Encounter (HOSPITAL_COMMUNITY): Payer: Self-pay

## 2021-11-24 ENCOUNTER — Ambulatory Visit: Payer: Medicare Other | Admitting: Internal Medicine

## 2021-11-24 ENCOUNTER — Encounter: Payer: Self-pay | Admitting: Internal Medicine

## 2021-11-24 ENCOUNTER — Ambulatory Visit (HOSPITAL_COMMUNITY)
Admission: RE | Admit: 2021-11-24 | Discharge: 2021-11-24 | Disposition: A | Discharge status: Home / Self Care | Payer: Medicare Other | Source: Ambulatory Visit | Attending: Oncology | Admitting: Oncology

## 2021-11-24 VITALS — BP 122/72 | HR 80 | Temp 98.2°F | Ht 65.0 in | Wt 172.0 lb

## 2021-11-24 DIAGNOSIS — Z23 Encounter for immunization: Secondary | ICD-10-CM

## 2021-11-24 DIAGNOSIS — C642 Malignant neoplasm of left kidney, except renal pelvis: Secondary | ICD-10-CM | POA: Insufficient documentation

## 2021-11-24 DIAGNOSIS — R7303 Prediabetes: Secondary | ICD-10-CM | POA: Diagnosis not present

## 2021-11-24 DIAGNOSIS — J189 Pneumonia, unspecified organism: Secondary | ICD-10-CM | POA: Diagnosis not present

## 2021-11-24 DIAGNOSIS — K7689 Other specified diseases of liver: Secondary | ICD-10-CM | POA: Diagnosis not present

## 2021-11-24 DIAGNOSIS — J439 Emphysema, unspecified: Secondary | ICD-10-CM | POA: Diagnosis not present

## 2021-11-24 DIAGNOSIS — R918 Other nonspecific abnormal finding of lung field: Secondary | ICD-10-CM | POA: Diagnosis not present

## 2021-11-24 DIAGNOSIS — J418 Mixed simple and mucopurulent chronic bronchitis: Secondary | ICD-10-CM

## 2021-11-24 DIAGNOSIS — N281 Cyst of kidney, acquired: Secondary | ICD-10-CM | POA: Diagnosis not present

## 2021-11-24 MED ORDER — STIOLTO RESPIMAT 2.5-2.5 MCG/ACT IN AERS: 2.0000 | INHALATION_SPRAY | Freq: Every day | RESPIRATORY_TRACT | 1 refills | Status: DC

## 2021-11-24 MED ORDER — SODIUM CHLORIDE (PF) 0.9 % IJ SOLN
INTRAMUSCULAR | Status: AC
  Filled 2021-11-24: qty 50

## 2021-11-24 MED ORDER — IOHEXOL 300 MG/ML  SOLN
100.0000 mL | Freq: Once | INTRAMUSCULAR | Status: AC | PRN
  Administered 2021-11-24: 100 mL via INTRAVENOUS

## 2021-11-24 NOTE — Progress Notes (Signed)
Subjective:  Patient ID: [name], male    DOB: 10/21/1940  Age: 81 y.o. MRN: [id number]  CC: Cough and COPD   HPI [name] presents for f/up -  He complains of intermittent cough productive of yellow phlegm with laryngitis, dysphagia, fatigue, and 3 pound weight loss.  Outpatient Medications Prior to Visit  Medication Sig Dispense Refill   apixaban (ELIQUIS) 5 MG TABS tablet Take 1 tablet by mouth twice daily 180 tablet 1   Calcium Carbonate (CALCIUM 500 PO) Take 500 mg by mouth 2 (two) times daily.     denosumab (PROLIA) 60 MG/ML SOSY injection Inject 60 mg into the skin every 6 (six) months.     esomeprazole (NEXIUM) 40 MG capsule Take 1 capsule (40 mg total) by mouth daily before breakfast. 90 capsule 3   Multiple Vitamins-Minerals (CENTRUM MULTI + OMEGA 3 PO) Take 1 tablet by mouth daily.      omega-3 acid ethyl esters (LOVAZA) 1 g capsule Take 2 capsules by mouth twice daily 360 capsule 1   prochlorperazine (COMPAZINE) 10 MG tablet Take 1 tablet (10 mg total) by mouth every 6 (six) hours as needed for nausea or vomiting. 30 tablet 0   rosuvastatin (CRESTOR) 10 MG tablet Take 1 tablet by mouth once daily 90 tablet 0   sildenafil (VIAGRA) 100 MG tablet Take 100 mg by mouth daily as needed for erectile dysfunction.     solifenacin (VESICARE) 10 MG tablet Take 1 tablet by mouth once daily 90 tablet 0   VITAMIN D PO Take 1,000 Units by mouth daily.     sodium chloride (PF) 0.9 % injection      No facility-administered medications prior to visit.    ROS Review of Systems  Constitutional:  Positive for fatigue and unexpected weight change. Negative for chills, diaphoresis and fever.  HENT:  Positive for trouble swallowing and voice change.   Eyes: Negative.   Respiratory:  Positive for cough and shortness of breath. Negative for wheezing.   Cardiovascular:  Negative for chest pain, palpitations and leg swelling.  Gastrointestinal:  Negative for abdominal  pain, constipation, diarrhea, nausea and vomiting.  Genitourinary:  Negative for difficulty urinating and hematuria.  Musculoskeletal: Negative.   Skin: Negative.   Neurological:  Negative for dizziness and weakness.  Hematological:  Negative for adenopathy. Does not bruise/bleed easily.  Psychiatric/Behavioral: Negative.      Objective:  BP 122/72 (BP Location: Right Arm, Patient Position: Sitting, Cuff Size: Large)   Pulse 80   Temp 98.2 F (36.8 C) (Oral)   Ht 5\' 5"  (1.651 m)   Wt 172 lb (78 kg)   SpO2 91%   BMI 28.62 kg/m   BP Readings from Last 3 Encounters:  11/24/21 122/72  11/12/21 127/73  10/22/21 123/63    Wt Readings from Last 3 Encounters:  11/24/21 172 lb (78 kg)  11/12/21 175 lb 4.8 oz (79.5 kg)  10/22/21 180 lb (81.6 kg)    Physical Exam Vitals reviewed.  Constitutional:      General: He is not in acute distress.    Appearance: He is ill-appearing. He is not toxic-appearing or diaphoretic.  HENT:     Mouth/Throat:     Mouth: Mucous membranes are moist.  Eyes:     General: No scleral icterus.    Conjunctiva/sclera: Conjunctivae normal.  Cardiovascular:     Rate and Rhythm: Normal rate and regular rhythm.     Pulses: Normal pulses.  Heart sounds: No murmur heard. Pulmonary:     Effort: Pulmonary effort is normal. No tachypnea, accessory muscle usage or respiratory distress.     Breath sounds: No stridor. Examination of the right-upper field reveals decreased breath sounds. Examination of the left-upper field reveals decreased breath sounds. Examination of the right-middle field reveals decreased breath sounds. Examination of the left-middle field reveals decreased breath sounds. Examination of the right-lower field reveals decreased breath sounds. Examination of the left-lower field reveals decreased breath sounds. Decreased breath sounds present. No wheezing or rhonchi.  Abdominal:     General: Abdomen is flat.     Palpations: There is no mass.      Tenderness: There is no abdominal tenderness. There is no guarding.     Hernia: No hernia is present.  Musculoskeletal:        General: Normal range of motion.     Cervical back: Neck supple.     Right lower leg: No edema.     Left lower leg: No edema.  Lymphadenopathy:     Cervical: No cervical adenopathy.  Skin:    General: Skin is warm and dry.  Neurological:     General: No focal deficit present.     Mental Status: He is alert.  Psychiatric:        Mood and Affect: Mood normal.        Behavior: Behavior normal.     Lab Results  Component Value Date   WBC 9.4 11/12/2021   HGB 12.3 (L) 11/12/2021   HCT 34.8 (L) 11/12/2021   PLT 120 (L) 11/12/2021   GLUCOSE 181 (H) 11/12/2021   CHOL 117 01/19/2021   TRIG 166.0 (H) 01/19/2021   HDL 32.80 (L) 01/19/2021   LDLDIRECT 59.0 01/17/2020   LDLCALC 51 01/19/2021   ALT 35 11/12/2021   AST 27 11/12/2021   NA 127 (L) 11/12/2021   K 4.9 11/12/2021   CL 95 (L) 11/12/2021   CREATININE 0.86 11/12/2021   BUN 21 11/12/2021   CO2 26 11/12/2021   TSH 1.558 11/12/2021   PSA 0.10 01/17/2020   INR 1.2 10/11/2018   HGBA1C 5.9 01/19/2021    CT Chest W Contrast  Result Date: 11/26/2021 CLINICAL DATA:  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 RADIATION DOSE REDUCTION: This exam was performed according to the departmental dose-optimization program which includes automated exposure control, adjustment of the mA and/or kV according to patient size and/or use of iterative reconstruction technique. CONTRAST:  157mL OMNIPAQUE IOHEXOL 300 MG/ML  SOLN COMPARISON:  08/14/2021 FINDINGS: CT CHEST FINDINGS Cardiovascular: Dual lead pacer noted. Coronary, aortic arch, and branch vessel atherosclerotic  vascular disease. Moderate cardiomegaly. Mediastinum/Nodes: Indistinct right hilar node 1.1 cm in short axis on image 50 series 6, previously the same by my measurements. Enlarged right infrahilar node 1.0 cm in short axis on image 61 series 6, previously 0.6 cm. Left infrahilar node or nodule, 0.9 cm in short axis on image 54 series 6. Small AP window lymph nodes are present. There is contrast medium in the esophagus compatible with dysmotility or gastroesophageal reflux. Retroesophageal lymph node 0.9 cm in short axis on image 59 series 6, formerly 0.5 cm. Lungs/Pleura: Centrilobular and paraseptal emphysema. Scattered scarring or atelectasis increased from prior. Increased patchy consolidation favoring the  lung bases. The masslike left upper lobe opacity shown on the prior exam is substantially improved from prior with only ground-glass opacity and scarring in the vicinity of the prior presumed consolidation. Interstitial accentuation is present peripherally especially favoring the lung bases. Increase consolidation posteriorly in the lingula. A rounded left lower lobe nodule measures 8 by 7 mm in diameter on image 86 series 10, formerly the same. Other areas of scattered irregular nodularity are present in the lungs, for example a 7 by 6 mm right middle lobe nodule on image 99 series 10 that was previously 5 by 4 mm. New oval-shaped nodule in the right middle lobe 1.2 by 0.6 cm on image 86 series 10. New nodularity posteriorly in the right upper lobe for example on image 77 series 10. Musculoskeletal: Prominent T12 compression fracture unchanged from previous. Mild gynecomastia. CT ABDOMEN PELVIS FINDINGS Hepatobiliary: 5 mm enhancing lesion in the right hepatic lobe on image 79 series 6, similar to 01/13/2021, probably a hemangioma or similar benign lesion. Gallbladder unremarkable. No new or progressive lesion identified. Pancreas: Prominently atrophic pancreatic tail. Associated calcification noted. 1.7 by  0.8 cm pancreatic tail hypodense lesion, previously 2.1 by 1.0 cm, query small pseudocyst or similar benign lesion. Compared to 08/14/2021, there is less fatty stippling visible within the pancreatic parenchyma, along with some expansion of the pancreatic head for example on image 107 of series 6, suspicious for pancreatitis. Correlate with lipase levels. Spleen: Unremarkable Adrenals/Urinary Tract: Left nephrectomy. Stable Bosniak category 2 cyst of the right kidney upper pole measuring 1.4 by 0.9 cm on image 43 series 11, this lesion does not appreciably enhance and does not require further follow up imaging. Other small hypodense right renal lesions are too small to characterize although statistically likely to be benign. These lesions do not require imaging follow by themselves although might be surveilled in the context of follow up cancer imaging. The right adrenal gland appears normal. Left adrenal gland poorly seen. Urinary bladder unremarkable. Stomach/Bowel: Sigmoid colon diverticulosis. Normal appendix. Vascular/Lymphatic: Atherosclerosis is present, including aortoiliac atherosclerotic disease. Infrarenal abdominal aortic aneurysm 3.2 cm in anterior-posterior dimension on image 74 series 11, stable. No pathologic adenopathy is identified. Substantial collateral vasculature along the stomach as before. Small uphill varices along the distal esophagus. Small type 1 hiatal hernia suspected. 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 Reproductive: Unremarkable Other: No supplemental non-categorized findings. Musculoskeletal: Prominent compression fractures at T12 and L1, as before. Prior umbilical hernia repair. IMPRESSION: 1. Compared to 08/14/2021, there is less fatty stippling within the pancreatic parenchyma, along with some expansion of the pancreatic head suspicious for acute pancreatitis. Pancreatic malignancy is considered less likely given the diffuse nature of the findings  correlate with lipase levels. Similar appearance of small probable pseudocyst along the pancreatic tail and calcification along the atrophic pancreatic tail. 2. Some areas of improvement (left upper lobe) and some areas of worsening (lung bases) regarding the scattered consolidation and airspace opacity in the lungs. Multilobar pneumonia not excluded. This combined with background interstitial accentuation and striking emphysema makes assessment for metastatic disease problematic. There are some scattered pulmonary nodules some of which may be inflammatory based on their morphology, but some of which persist and are probably neoplastic. Nuclear medicine PET-CT may provide complimentary information. 3. Mild bilateral hilar and infrahilar adenopathy, increased from prior, nonspecific. 4. Stable 3.2 cm infrarenal abdominal aortic aneurysm. This can likely be followed in the context of the patient's follow up cancer imaging. Otherwise, recommend follow up  ultrasound every 3 years. This recommendation follows ACR consensus guidelines: White Paper of the ACR Incidental Findings Committee II on Vascular Findings. J Am Coll Radiol 2013; 10:789-794. 5. Other imaging findings of potential clinical significance: Coronary atherosclerosis with moderate cardiomegaly. Gastroesophageal reflux or dysmotility. Stable 5 mm enhancing lesion in the right hepatic lobe, probably a hemangioma or similar benign lesion. Left nephrectomy. Sigmoid colon diverticulosis. Prominent compression fractures at T12 and L1, as before. Substantial collateral vasculature along the stomach as before, indicating portal venous hypertension. Small uphill varices along the distal esophagus. Aortic Atherosclerosis (ICD10-I70.0) and Emphysema (ICD10-J43.9). Electronically Signed   By: Van Clines M.D.   On: 11/26/2021 09:59   CT Abdomen Pelvis W Wo Contrast  Result Date: 11/26/2021 CLINICAL DATA:  Metastatic left renal cell carcinoma to the adrenal  gland * Tracking Code: BO * EXAM: CT CHEST, ABDOMEN, AND PELVIS WITH CONTRAST TECHNIQUE: Multidetector CT imaging of the chest, abdomen and pelvis was performed following the standard protocol during bolus administration of intravenous contrast. RADIATION DOSE REDUCTION: This exam was performed according to the departmental dose-optimization program which includes automated exposure control, adjustment of the mA and/or kV according to patient size and/or use of iterative reconstruction technique. CONTRAST:  153mL OMNIPAQUE IOHEXOL 300 MG/ML  SOLN COMPARISON:  08/14/2021 FINDINGS: CT CHEST FINDINGS Cardiovascular: Dual lead pacer noted. Coronary, aortic arch, and branch vessel atherosclerotic vascular disease. Moderate cardiomegaly. Mediastinum/Nodes: Indistinct right hilar node 1.1 cm in short axis on image 50 series 6, previously the same by my measurements. Enlarged right infrahilar node 1.0 cm in short axis on image 61 series 6, previously 0.6 cm. Left infrahilar node or nodule, 0.9 cm in short axis on image 54 series 6. Small AP window lymph nodes are present. There is contrast medium in the esophagus compatible with dysmotility or gastroesophageal reflux. Retroesophageal lymph node 0.9 cm in short axis on image 59 series 6, formerly 0.5 cm. Lungs/Pleura: Centrilobular and paraseptal emphysema. Scattered scarring or atelectasis increased from prior. Increased patchy consolidation favoring the lung bases. The masslike left upper lobe opacity shown on the prior exam is substantially improved from prior with only ground-glass opacity and scarring in the vicinity of the prior presumed consolidation. Interstitial accentuation is present peripherally especially favoring the lung bases. Increase consolidation posteriorly in the lingula. A rounded left lower lobe nodule measures 8 by 7 mm in diameter on image 86 series 10, formerly the same. Other areas of scattered irregular nodularity are present in the lungs, for  example a 7 by 6 mm right middle lobe nodule on image 99 series 10 that was previously 5 by 4 mm. New oval-shaped nodule in the right middle lobe 1.2 by 0.6 cm on image 86 series 10. New nodularity posteriorly in the right upper lobe for example on image 77 series 10. Musculoskeletal: Prominent T12 compression fracture unchanged from previous. Mild gynecomastia. CT ABDOMEN PELVIS FINDINGS Hepatobiliary: 5 mm enhancing lesion in the right hepatic lobe on image 79 series 6, similar to 01/13/2021, probably a hemangioma or similar benign lesion. Gallbladder unremarkable. No new or progressive lesion identified. Pancreas: Prominently atrophic pancreatic tail. Associated calcification noted. 1.7 by 0.8 cm pancreatic tail hypodense lesion, previously 2.1 by 1.0 cm, query small pseudocyst or similar benign lesion. Compared to 08/14/2021, there is less fatty stippling visible within the pancreatic parenchyma, along with some expansion of the pancreatic head for example on image 107 of series 6, suspicious for pancreatitis. Correlate with lipase levels. Spleen: Unremarkable Adrenals/Urinary Tract: Left nephrectomy.  Stable Bosniak category 2 cyst of the right kidney upper pole measuring 1.4 by 0.9 cm on image 43 series 11, this lesion does not appreciably enhance and does not require further follow up imaging. Other small hypodense right renal lesions are too small to characterize although statistically likely to be benign. These lesions do not require imaging follow by themselves although might be surveilled in the context of follow up cancer imaging. The right adrenal gland appears normal. Left adrenal gland poorly seen. Urinary bladder unremarkable. Stomach/Bowel: Sigmoid colon diverticulosis. Normal appendix. Vascular/Lymphatic: Atherosclerosis is present, including aortoiliac atherosclerotic disease. Infrarenal abdominal aortic aneurysm 3.2 cm in anterior-posterior dimension on image 74 series 11, stable. No pathologic  adenopathy is identified. Substantial collateral vasculature along the stomach as before. Small uphill varices along the distal esophagus. Small type 1 hiatal hernia suspected. 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 Reproductive: Unremarkable Other: No supplemental non-categorized findings. Musculoskeletal: Prominent compression fractures at T12 and L1, as before. Prior umbilical hernia repair. IMPRESSION: 1. Compared to 08/14/2021, there is less fatty stippling within the pancreatic parenchyma, along with some expansion of the pancreatic head suspicious for acute pancreatitis. Pancreatic malignancy is considered less likely given the diffuse nature of the findings correlate with lipase levels. Similar appearance of small probable pseudocyst along the pancreatic tail and calcification along the atrophic pancreatic tail. 2. Some areas of improvement (left upper lobe) and some areas of worsening (lung bases) regarding the scattered consolidation and airspace opacity in the lungs. Multilobar pneumonia not excluded. This combined with background interstitial accentuation and striking emphysema makes assessment for metastatic disease problematic. There are some scattered pulmonary nodules some of which may be inflammatory based on their morphology, but some of which persist and are probably neoplastic. Nuclear medicine PET-CT may provide complimentary information. 3. Mild bilateral hilar and infrahilar adenopathy, increased from prior, nonspecific. 4. Stable 3.2 cm infrarenal abdominal aortic aneurysm. This can likely be followed in the context of the patient's follow up cancer imaging. Otherwise, recommend follow up ultrasound every 3 years. This recommendation follows ACR consensus guidelines: White Paper of the ACR Incidental Findings Committee II on Vascular Findings. J Am Coll Radiol 2013; 10:789-794. 5. Other imaging findings of potential clinical significance: Coronary  atherosclerosis with moderate cardiomegaly. Gastroesophageal reflux or dysmotility. Stable 5 mm enhancing lesion in the right hepatic lobe, probably a hemangioma or similar benign lesion. Left nephrectomy. Sigmoid colon diverticulosis. Prominent compression fractures at T12 and L1, as before. Substantial collateral vasculature along the stomach as before, indicating portal venous hypertension. Small uphill varices along the distal esophagus. Aortic Atherosclerosis (ICD10-I70.0) and Emphysema (ICD10-J43.9). Electronically Signed   By: Van Clines M.D.   On: 11/26/2021 09:59   CUP PACEART REMOTE DEVICE CHECK  Result Date: 10/14/2021 Scheduled remote reviewed. Normal device function.  3 NSVT, EGM's show brief 1:1 Next remote 91 days. 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 RADIATION DOSE REDUCTION: This exam was performed according to the departmental dose-optimization program which includes automated exposure control, adjustment of the mA and/or kV according to patient size and/or use of iterative reconstruction technique. CONTRAST:  174mL OMNIPAQUE IOHEXOL 300 MG/ML  SOLN COMPARISON:  08/14/2021 FINDINGS: CT CHEST FINDINGS  Cardiovascular: Dual lead pacer noted. Coronary, aortic arch, and branch vessel atherosclerotic vascular disease. Moderate cardiomegaly. Mediastinum/Nodes: Indistinct right hilar node 1.1 cm in short axis on image 50 series 6, previously the same by my measurements. Enlarged right infrahilar node 1.0 cm in short axis on image 61 series 6, previously 0.6 cm. Left infrahilar node or nodule, 0.9 cm in short axis on image 54 series 6. Small AP window lymph nodes are  present. There is contrast medium in the esophagus compatible with dysmotility or gastroesophageal reflux. Retroesophageal lymph node 0.9 cm in short axis on image 59 series 6, formerly 0.5 cm. Lungs/Pleura: Centrilobular and paraseptal emphysema. Scattered scarring or atelectasis increased from prior. Increased patchy consolidation favoring the lung bases. The masslike left upper lobe opacity shown on the prior exam is substantially improved from prior with only ground-glass opacity and scarring in the vicinity of the prior presumed consolidation. Interstitial accentuation is present peripherally especially favoring the lung bases. Increase consolidation posteriorly in the lingula. A rounded left lower lobe nodule measures 8 by 7 mm in diameter on image 86 series 10, formerly the same. Other areas of scattered irregular nodularity are present in the lungs, for example a 7 by 6 mm right middle lobe nodule on image 99 series 10 that was previously 5 by 4 mm. New oval-shaped nodule in the right middle lobe 1.2 by 0.6 cm on image 86 series 10. New nodularity posteriorly in the right upper lobe for example on image 77 series 10. Musculoskeletal: Prominent T12 compression fracture unchanged from previous. Mild gynecomastia. CT ABDOMEN PELVIS FINDINGS Hepatobiliary: 5 mm enhancing lesion in the right hepatic lobe on image 79 series 6, similar to 01/13/2021, probably a hemangioma or similar benign lesion. Gallbladder unremarkable. No new or progressive lesion identified. Pancreas: Prominently atrophic pancreatic tail. Associated calcification noted. 1.7 by 0.8 cm pancreatic tail hypodense lesion, previously 2.1 by 1.0 cm, query small pseudocyst or similar benign lesion. Compared to 08/14/2021, there is less fatty stippling visible within the pancreatic parenchyma, along with some expansion of the pancreatic head for example on image 107 of series 6, suspicious for pancreatitis. Correlate with lipase levels. Spleen:  Unremarkable Adrenals/Urinary Tract: Left nephrectomy. Stable Bosniak category 2 cyst of the right kidney upper pole measuring 1.4 by 0.9 cm on image 43 series 11, this lesion does not appreciably enhance and does not require further follow up imaging. Other small hypodense right renal lesions are too small to characterize although statistically likely to be benign. These lesions do not require imaging follow by themselves although might be surveilled in the context of follow up cancer imaging. The right adrenal gland appears normal. Left adrenal gland poorly seen. Urinary bladder unremarkable. Stomach/Bowel: Sigmoid colon diverticulosis. Normal appendix. Vascular/Lymphatic: Atherosclerosis is present, including aortoiliac atherosclerotic disease. Infrarenal abdominal aortic aneurysm 3.2 cm in anterior-posterior dimension on image 74 series 11, stable. No pathologic adenopathy is identified. Substantial collateral vasculature along the stomach as before. Small uphill varices along the distal esophagus. Small type 1 hiatal hernia suspected. 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 Reproductive: Unremarkable Other: No supplemental non-categorized findings. Musculoskeletal: Prominent compression fractures at T12 and L1, as before. Prior umbilical hernia repair. IMPRESSION: 1. Compared to 08/14/2021, there is less fatty stippling within the pancreatic parenchyma, along with some expansion of the pancreatic head suspicious for acute pancreatitis. Pancreatic malignancy is considered less likely given the diffuse nature of the findings correlate with lipase levels. Similar appearance of small probable pseudocyst along the pancreatic  tail and calcification along the atrophic pancreatic tail. 2. Some areas of improvement (left upper lobe) and some areas of worsening (lung bases) regarding the scattered consolidation and airspace opacity in the lungs. Multilobar pneumonia not excluded. This  combined with background interstitial accentuation and striking emphysema makes assessment for metastatic disease problematic. There are some scattered pulmonary nodules some of which may be inflammatory based on their morphology, but some of which persist and are probably neoplastic. Nuclear medicine PET-CT may provide complimentary information. 3. Mild bilateral hilar and infrahilar adenopathy, increased from prior, nonspecific. 4. Stable 3.2 cm infrarenal abdominal aortic aneurysm. This can likely be followed in the context of the patient's follow up cancer imaging. Otherwise, recommend follow up ultrasound every 3 years. This recommendation follows ACR consensus guidelines: White Paper of the ACR Incidental Findings Committee II on Vascular Findings. J Am Coll Radiol 2013; 10:789-794. 5. Other imaging findings of potential clinical significance: Coronary atherosclerosis with moderate cardiomegaly. Gastroesophageal reflux or dysmotility. Stable 5 mm enhancing lesion in the right hepatic lobe, probably a hemangioma or similar benign lesion. Left nephrectomy. Sigmoid colon diverticulosis. Prominent compression fractures at T12 and L1, as before. Substantial collateral vasculature along the stomach as before, indicating portal venous hypertension. Small uphill varices along the distal esophagus. Aortic Atherosclerosis (ICD10-I70.0) and Emphysema (ICD10-J43.9). Electronically Signed   By: Van Clines M.D.   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 RADIATION DOSE REDUCTION: This exam was performed according to the departmental dose-optimization program which  includes automated exposure control, adjustment of the mA and/or kV according to patient size and/or use of iterative reconstruction technique. CONTRAST:  128mL OMNIPAQUE IOHEXOL 300 MG/ML  SOLN COMPARISON:  08/14/2021 FINDINGS: CT CHEST FINDINGS Cardiovascular: Dual lead pacer noted. Coronary, aortic arch, and branch vessel atherosclerotic vascular disease. Moderate cardiomegaly. Mediastinum/Nodes: Indistinct right hilar node 1.1 cm in short axis on image 50 series 6, previously the same by my measurements. Enlarged right infrahilar node 1.0 cm in short axis on image 61 series 6, previously 0.6 cm. Left infrahilar node or nodule, 0.9 cm in short axis on image 54 series 6. Small AP window lymph nodes are present. There is contrast medium in the esophagus compatible with dysmotility or gastroesophageal reflux. Retroesophageal lymph node 0.9 cm in short axis on image 59 series 6, formerly 0.5 cm. Lungs/Pleura: Centrilobular and paraseptal emphysema. Scattered scarring or atelectasis increased from prior. Increased patchy consolidation favoring the lung bases. The masslike left upper lobe opacity shown on the prior exam is substantially improved from prior with only ground-glass opacity and scarring in the vicinity of the prior presumed consolidation. Interstitial accentuation is present peripherally especially favoring the lung bases. Increase consolidation posteriorly in the lingula. A rounded left lower lobe nodule measures 8 by 7 mm in diameter on image 86 series 10, formerly the same. Other areas of scattered irregular nodularity are present in the lungs, for example a 7 by 6 mm right middle lobe nodule on image 99 series 10 that was previously 5 by 4 mm. New oval-shaped nodule in the right middle lobe 1.2 by 0.6 cm on image 86 series 10. New nodularity posteriorly in the right upper lobe for example  on image 77 series 10. Musculoskeletal: Prominent T12 compression fracture unchanged from previous. Mild  gynecomastia. CT ABDOMEN PELVIS FINDINGS Hepatobiliary: 5 mm enhancing lesion in the right hepatic lobe on image 79 series 6, similar to 01/13/2021, probably a hemangioma or similar benign lesion. Gallbladder unremarkable. No new or progressive lesion identified. Pancreas: Prominently atrophic pancreatic tail. Associated calcification noted. 1.7 by 0.8 cm pancreatic tail hypodense lesion, previously 2.1 by 1.0 cm, query small pseudocyst or similar benign lesion. Compared to 08/14/2021, there is less fatty stippling visible within the pancreatic parenchyma, along with some expansion of the pancreatic head for example on image 107 of series 6, suspicious for pancreatitis. Correlate with lipase levels. Spleen: Unremarkable Adrenals/Urinary Tract: Left nephrectomy. Stable Bosniak category 2 cyst of the right kidney upper pole measuring 1.4 by 0.9 cm on image 43 series 11, this lesion does not appreciably enhance and does not require further follow up imaging. Other small hypodense right renal lesions are too small to characterize although statistically likely to be benign. These lesions do not require imaging follow by themselves although might be surveilled in the context of follow up cancer imaging. The right adrenal gland appears normal. Left adrenal gland poorly seen. Urinary bladder unremarkable. Stomach/Bowel: Sigmoid colon diverticulosis. Normal appendix. Vascular/Lymphatic: Atherosclerosis is present, including aortoiliac atherosclerotic disease. Infrarenal abdominal aortic aneurysm 3.2 cm in anterior-posterior dimension on image 74 series 11, stable. No pathologic adenopathy is identified. Substantial collateral vasculature along the stomach as before. Small uphill varices along the distal esophagus. Small type 1 hiatal hernia suspected. 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 Reproductive: Unremarkable Other: No supplemental non-categorized findings. Musculoskeletal: Prominent  compression fractures at T12 and L1, as before. Prior umbilical hernia repair. IMPRESSION: 1. Compared to 08/14/2021, there is less fatty stippling within the pancreatic parenchyma, along with some expansion of the pancreatic head suspicious for acute pancreatitis. Pancreatic malignancy is considered less likely given the diffuse nature of the findings correlate with lipase levels. Similar appearance of small probable pseudocyst along the pancreatic tail and calcification along the atrophic pancreatic tail. 2. Some areas of improvement (left upper lobe) and some areas of worsening (lung bases) regarding the scattered consolidation and airspace opacity in the lungs. Multilobar pneumonia not excluded. This combined with background interstitial accentuation and striking emphysema makes assessment for metastatic disease problematic. There are some scattered pulmonary nodules some of which may be inflammatory based on their morphology, but some of which persist and are probably neoplastic. Nuclear medicine PET-CT may provide complimentary information. 3. Mild bilateral hilar and infrahilar adenopathy, increased from prior, nonspecific. 4. Stable 3.2 cm infrarenal abdominal aortic aneurysm. This can likely be followed in the context of the patient's follow up cancer imaging. Otherwise, recommend follow up ultrasound every 3 years. This recommendation follows ACR consensus guidelines: White Paper of the ACR Incidental Findings Committee II on Vascular Findings. J Am Coll Radiol 2013; 10:789-794. 5. Other imaging findings of potential clinical significance: Coronary atherosclerosis with moderate cardiomegaly. Gastroesophageal reflux or dysmotility. Stable 5 mm enhancing lesion in the right hepatic lobe, probably a hemangioma or similar benign lesion. Left nephrectomy. Sigmoid colon diverticulosis. Prominent compression fractures at T12 and L1, as before. Substantial collateral vasculature along the stomach as before,  indicating portal venous hypertension. Small uphill varices along the distal esophagus. Aortic Atherosclerosis (ICD10-I70.0) and Emphysema (ICD10-J43.9). Electronically Signed   By: Van Clines M.D.   On: 11/26/2021 09:59      Assessment & Plan:   Josafat was seen today  for cough and copd.  Diagnoses and all orders for this visit:  Flu vaccine need -     Flu Vaccine QUAD High Dose(Fluad)  Prediabetes- I will monitor his A1c. -     Hemoglobin A1c; Future  Mixed simple and mucopurulent chronic bronchitis ([place])- I discussed with he and his wife using a nebulizer versus using a handheld inhaler.  He prefers using a hand-held device. -     Tiotropium Bromide-Olodaterol (STIOLTO RESPIMAT) 2.5-2.5 MCG/ACT AERS; Inhale 2 puffs into the lungs daily.  Pneumonia of both lungs due to infectious organism, unspecified part of lung- CT scan is suspicious for worsening pneumonia.  Will treat with Augmentin. -     amoxicillin-clavulanate (AUGMENTIN) 875-125 MG tablet; Take 1 tablet by mouth 2 (two) times daily.   I am having [name] "Frank [name]" start on Stiolto Respimat and amoxicillin-clavulanate. I am also having him maintain his Calcium Carbonate (CALCIUM 500 PO), VITAMIN D PO, Multiple Vitamins-Minerals (CENTRUM MULTI + OMEGA 3 PO), denosumab, esomeprazole, sildenafil, prochlorperazine, solifenacin, Eliquis, rosuvastatin, and omega-3 acid ethyl esters.  Meds ordered this encounter  Medications   Tiotropium Bromide-Olodaterol (STIOLTO RESPIMAT) 2.5-2.5 MCG/ACT AERS    Sig: Inhale 2 puffs into the lungs daily.    Dispense:  12 g    Refill:  1   amoxicillin-clavulanate (AUGMENTIN) 875-125 MG tablet    Sig: Take 1 tablet by mouth 2 (two) times daily.    Dispense:  20 tablet    Refill:  0   I spent 45 minutes in preparing to see the patient by review of recent labs and images, obtaining and reviewing separately obtained history, communicating with the patient and his wife, ordering  medications, tests or procedures, and documenting clinical information in the EHR including the differential Dx, treatment, and any further evaluation and management of multiple complex medical issues.      Follow-up: Return in about 3 months (around 02/23/2022).  Scarlette Calico, MD

## 2021-11-24 NOTE — Patient Instructions (Signed)
Chronic Obstructive Pulmonary Disease  Chronic obstructive pulmonary disease (COPD) is a long-term (chronic) condition that affects the lungs. COPD is a general term that can be used to describe many different lung problems that cause lung inflammation and limit airflow, including chronic bronchitis and emphysema. If you have COPD, your lung function will probably never return to normal. In most cases, it gets worse over time. However, there are steps you can take to slow the progression of the disease and improve your quality of life. What are the causes? This condition may be caused by: Smoking. This is the most common cause. Certain genes passed down through families. What increases the risk? The following factors may make you more likely to develop this condition: Being exposed to secondhand smoke from cigarettes, pipes, or cigars. Being exposed to chemicals and other irritants, such as fumes and dust in the work environment. Having chronic lung conditions or infections. What are the signs or symptoms? Symptoms of this condition include: Shortness of breath, especially during physical activity. Chronic cough with a large amount of thick mucus. Sometimes, the cough may not have any mucus (dry cough). Wheezing and rapid breathing. Gray or bluish discoloration (cyanosis) of the skin, especially in the fingers, toes, or lips. Feeling tired (fatigue). Weight loss. Chest tightness. Frequent infections. Episodes when breathing symptoms become much worse (exacerbations). At the later stages of this disease, you may have swelling in the ankles, feet, or legs. How is this diagnosed? This condition is diagnosed based on: Your medical history. A physical exam. You may also have tests, including: Lung (pulmonary) function tests. This may include a spirometry test, which measures your ability to exhale properly. Chest X-ray. CT scan. Blood tests. How is this treated? This condition may be  treated with: Medicines. These may include inhaled rescue medicines to treat acute exacerbations as well as medicines that you take long-term (maintenance medicines) to prevent flare-ups of COPD. Bronchodilators help treat COPD by dilating the airways to allow increased airflow and make your breathing more comfortable. Steroids can reduce airway inflammation and help prevent exacerbations. Smoking cessation. If you smoke, your health care provider may ask you to quit, and may also recommend therapy or replacement products to help you quit. Pulmonary rehabilitation. This may involve working with a team of health care providers and specialists, such as respiratory, occupational, and physical therapists. Exercise and physical activity. These are beneficial for nearly all people with COPD. Nutrition therapy to gain weight, if you are underweight. Oxygen. Supplemental oxygen therapy is only helpful if you have a low oxygen level in your blood (hypoxemia). Lung surgery or transplant. Palliative care. This is to help people with COPD feel comfortable when treatment is no longer working. Follow these instructions at home: Medicines Take over-the-counter and prescription medicines only as told by your health care provider. This includes inhaled medicines and pills. Talk to your health care provider before taking any cough or allergy medicines. You may need to avoid certain medicines that dry out your airways. Lifestyle If you smoke, the most important thing that you can do is to stop smoking. Continuing to smoke will cause the disease to progress faster. Do not use any products that contain nicotine or tobacco. These products include cigarettes, chewing tobacco, and vaping devices, such as e-cigarettes. If you need help quitting, ask your health care provider. Avoid exposure to things that irritate your lungs, such as smoke, chemicals, and fumes. Stay active, but balance activity with periods of rest.  Exercise and   physical activity will help you maintain your ability to do things you want to do. Learn and use relaxation techniques to manage stress and to control your breathing. Get the right amount of sleep and get quality sleep. Most adults need 7 or more hours per night. Eat healthy foods. Eating smaller, more frequent meals and resting before meals may help you maintain your strength. Controlled breathing Learn and use controlled breathing techniques as directed by your health care provider. Controlled breathing techniques include: Pursed lip breathing. Start by breathing in (inhaling) through your nose for 1 second. Then, purse your lips as if you were going to whistle and breathe out (exhale) through the pursed lips for 2 seconds. Diaphragmatic breathing. Start by putting one hand on your abdomen just above your waist. Inhale slowly through your nose. The hand on your abdomen should move out. Then purse your lips and exhale slowly. You should be able to feel the hand on your abdomen moving in as you exhale.  Controlled coughing Learn and use controlled coughing to clear mucus from your lungs. Controlled coughing is a series of short, progressive coughs. The steps of controlled coughing are: Lean your head slightly forward. Breathe in deeply using diaphragmatic breathing. Try to hold your breath for 3 seconds. Keep your mouth slightly open while coughing twice. Spit any mucus out into a tissue. Rest and repeat the steps once or twice as needed. General instructions Make sure you receive all the vaccines that your health care provider recommends, especially the pneumococcal and influenza vaccines. Preventing infection and hospitalization is very important when you have COPD. Drink enough fluid to keep your urine pale yellow, unless you have a medical condition that requires fluid restriction. Use oxygen therapy and pulmonary rehabilitation if told by your health care provider. If you  require home oxygen therapy, ask your health care provider whether you should purchase a pulse oximeter to measure your oxygen level at home. Work with your health care provider to develop a COPD action plan. This will help you know what steps to take if your condition gets worse. Keep other chronic health conditions under control as told by your health care provider. Avoid extreme temperature and humidity changes. Avoid contact with people who have an illness that spreads from person to person (is contagious), such as viral infections or pneumonia. Keep all follow-up visits. This is important. Contact a health care provider if: You are coughing up more mucus than usual. There is a change in the color or thickness of your mucus. Your breathing is more labored than usual. Your breathing is faster than usual. You have difficulty sleeping. You need to use your rescue medicines or inhalers more often than expected. You have trouble doing routine activities such as getting dressed or walking around the house. Get help right away if: You have shortness of breath while you are resting. You have shortness of breath that prevents you from: Being able to talk. Performing your usual physical activities. You have chest pain lasting longer than 5 minutes. Your skin color is more blue (cyanotic) than usual. You measure low oxygen saturations for longer than 5 minutes with a pulse oximeter. You have a fever. You feel too tired to breathe normally. These symptoms may represent a serious problem that is an emergency. Do not wait to see if the symptoms will go away. Get medical help right away. Call your local emergency services (911 in the [place] Do not drive yourself to the hospital. Summary Chronic obstructive   pulmonary disease (COPD) is a long-term (chronic) condition that affects the lungs. Your lung function will probably never return to normal. In most cases, it gets worse over time. However, there  are steps you can take to slow the progression of the disease and improve your quality of life. Treatment for COPD may include taking medicines, quitting smoking, pulmonary rehabilitation, and changes to diet and exercise. As the disease progresses, you may need oxygen therapy, a lung transplant, or palliative care. To help manage your condition, do not smoke, avoid exposure to things that irritate your lungs, stay up to date on all vaccines, and follow your health care provider's instructions for taking medicines. This information is not intended to replace advice given to you by your health care provider. Make sure you discuss any questions you have with your health care provider. Document Revised: 12/25/2019 Document Reviewed: 12/25/2019 [employer] Patient Education  2023 [employer]  

## 2021-11-26 ENCOUNTER — Telehealth: Payer: Medicare Other

## 2021-11-27 ENCOUNTER — Telehealth: Payer: Medicare Other

## 2021-11-27 DIAGNOSIS — J22 Unspecified acute lower respiratory infection: Secondary | ICD-10-CM | POA: Insufficient documentation

## 2021-11-27 DIAGNOSIS — J189 Pneumonia, unspecified organism: Secondary | ICD-10-CM | POA: Insufficient documentation

## 2021-11-27 MED ORDER — AMOXICILLIN-POT CLAVULANATE 875-125 MG PO TABS: 1.0000 | ORAL_TABLET | Freq: Two times a day (BID) | ORAL | 0 refills | Status: DC

## 2021-12-03 ENCOUNTER — Inpatient Hospital Stay: Payer: Medicare Other

## 2021-12-03 ENCOUNTER — Other Ambulatory Visit: Payer: Self-pay

## 2021-12-03 ENCOUNTER — Inpatient Hospital Stay: Payer: Medicare Other | Attending: Oncology

## 2021-12-03 ENCOUNTER — Inpatient Hospital Stay: Payer: Medicare Other | Admitting: Oncology

## 2021-12-03 DIAGNOSIS — C642 Malignant neoplasm of left kidney, except renal pelvis: Secondary | ICD-10-CM

## 2021-12-03 DIAGNOSIS — Z7901 Long term (current) use of anticoagulants: Secondary | ICD-10-CM | POA: Insufficient documentation

## 2021-12-03 DIAGNOSIS — Z79899 Other long term (current) drug therapy: Secondary | ICD-10-CM | POA: Diagnosis not present

## 2021-12-03 DIAGNOSIS — C7970 Secondary malignant neoplasm of unspecified adrenal gland: Secondary | ICD-10-CM | POA: Insufficient documentation

## 2021-12-03 DIAGNOSIS — C7800 Secondary malignant neoplasm of unspecified lung: Secondary | ICD-10-CM | POA: Diagnosis not present

## 2021-12-03 DIAGNOSIS — Z905 Acquired absence of kidney: Secondary | ICD-10-CM | POA: Diagnosis not present

## 2021-12-03 LAB — CMP (CANCER CENTER ONLY)
ALT: 20 U/L (ref 0–44)
AST: 17 U/L (ref 15–41)
Albumin: 3.9 g/dL (ref 3.5–5.0)
Alkaline Phosphatase: 39 U/L (ref 38–126)
Anion gap: 5 (ref 5–15)
BUN: 15 mg/dL (ref 8–23)
CO2: 25 mmol/L (ref 22–32)
Calcium: 9.4 mg/dL (ref 8.9–10.3)
Chloride: 96 mmol/L — ABNORMAL LOW (ref 98–111)
Creatinine: 0.89 mg/dL (ref 0.61–1.24)
GFR, Estimated: >60 mL/min
Glucose, Bld: 177 mg/dL — ABNORMAL HIGH (ref 70–99)
Potassium: 4.1 mmol/L (ref 3.5–5.1)
Sodium: 126 mmol/L — ABNORMAL LOW (ref 135–145)
Total Bilirubin: 0.8 mg/dL (ref 0.3–1.2)
Total Protein: 7.5 g/dL (ref 6.5–8.1)

## 2021-12-03 LAB — CBC WITH DIFFERENTIAL (CANCER CENTER ONLY)
Abs Immature Granulocytes: 0.06 10*3/uL (ref 0.00–0.07)
Basophils Absolute: 0 10*3/uL (ref 0.0–0.1)
Basophils Relative: 0 %
Eosinophils Absolute: 0 10*3/uL (ref 0.0–0.5)
Eosinophils Relative: 1 %
HCT: 33.1 % — ABNORMAL LOW (ref 39.0–52.0)
Hemoglobin: 11.6 g/dL — ABNORMAL LOW (ref 13.0–17.0)
Immature Granulocytes: 1 %
Lymphocytes Relative: 11 %
Lymphs Abs: 0.6 10*3/uL — ABNORMAL LOW (ref 0.7–4.0)
MCH: 33.3 pg (ref 26.0–34.0)
MCHC: 35 g/dL (ref 30.0–36.0)
MCV: 95.1 fL (ref 80.0–100.0)
Monocytes Absolute: 0.5 10*3/uL (ref 0.1–1.0)
Monocytes Relative: 9 %
Neutro Abs: 4.1 10*3/uL (ref 1.7–7.7)
Neutrophils Relative %: 78 %
Platelet Count: 105 10*3/uL — ABNORMAL LOW (ref 150–400)
RBC: 3.48 MIL/uL — ABNORMAL LOW (ref 4.22–5.81)
RDW: 15.7 % — ABNORMAL HIGH (ref 11.5–15.5)
WBC Count: 5.2 10*3/uL (ref 4.0–10.5)
nRBC: 0 % (ref 0.0–0.2)

## 2021-12-03 LAB — TSH: TSH: 2.237 u[IU]/mL (ref 0.350–4.500)

## 2021-12-03 MED ORDER — METHYLPREDNISOLONE 4 MG PO TBPK: ORAL_TABLET | ORAL | 1 refills | Status: DC

## 2021-12-03 NOTE — Progress Notes (Signed)
Hematology and Oncology Follow Up   Frank [name] [id number] 12/05/1940 81 y.o. 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   He initially presented with localized disease in 2020.   Prior Therapy:  He is status post left radical nephrectomy completed by Dr. Gloriann Loan on October 18, 2018.  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  Current therapy: Ipilimumab 1 mg/kg and nivolumab 3 mg/kg started on October 01, 2021.  He completed 2 cycles of therapy and currently on hold.  Interim History: Frank [name] returns today for repeat evaluation.  Since the last visit, he continues to have issues with dry mouth and sore throat although his cough has improved.  This has affected his quality of life predominantly his speech and his appetite.  He denies any nausea, vomiting or abdominal pain.  He denies any shortness of breath or difficulty breathing.    Medications: Updated on review. Current Outpatient Medications  Medication Sig Dispense Refill   amoxicillin-clavulanate (AUGMENTIN) 875-125 MG tablet Take 1 tablet by mouth 2 (two) times daily. 20 tablet 0   apixaban (ELIQUIS) 5 MG TABS tablet Take 1 tablet by mouth twice daily 180 tablet 1   Calcium Carbonate (CALCIUM 500 PO) Take 500 mg by mouth 2 (two) times daily.     denosumab (PROLIA) 60 MG/ML SOSY injection Inject 60 mg into the skin every 6 (six) months.     esomeprazole (NEXIUM) 40 MG capsule Take 1 capsule (40 mg total) by mouth daily before breakfast. 90 capsule 3   Multiple Vitamins-Minerals (CENTRUM MULTI + OMEGA 3 PO) Take 1 tablet by mouth daily.      omega-3 acid ethyl esters (LOVAZA) 1 g capsule Take 2 capsules by mouth twice daily 360 capsule 1   prochlorperazine (COMPAZINE) 10 MG tablet Take 1 tablet (10 mg total) by mouth every 6  (six) hours as needed for nausea or vomiting. 30 tablet 0   rosuvastatin (CRESTOR) 10 MG tablet Take 1 tablet by mouth once daily 90 tablet 0   sildenafil (VIAGRA) 100 MG tablet Take 100 mg by mouth daily as needed for erectile dysfunction.     solifenacin (VESICARE) 10 MG tablet Take 1 tablet by mouth once daily 90 tablet 0   Tiotropium Bromide-Olodaterol (STIOLTO RESPIMAT) 2.5-2.5 MCG/ACT AERS Inhale 2 puffs into the lungs daily. 12 g 1   VITAMIN D PO Take 1,000 Units by mouth daily.     No current facility-administered medications for this visit.     Allergies:  Allergies  Allergen Reactions   Hydrocodone Other (See Comments)    Caused confusion    Monosodium Glutamate Other (See Comments)    Caused headaches   Naproxen Sodium     Mouth blisters with prolonged use.  Left nephrectomy 2020   Other     Perfume - headaches    Physical exam:       Blood pressure (!) 119/56, pulse 69, temperature 98 F (36.7 C), temperature source Oral, resp. rate 16, weight 172 lb 3 oz (78.1 kg), SpO2 95 %.     ECOG 1    General appearance: Comfortable appearing without any discomfort Head: Normocephalic without any trauma Oropharynx: Mucous membranes are moist and pink without any thrush or ulcers. Eyes: Pupils are equal and round reactive to light. Lymph nodes: No cervical,  supraclavicular, inguinal or axillary lymphadenopathy.   Heart:regular rate and rhythm.  S1 and S2 without leg edema. Lung: Clear without any rhonchi or wheezes.  No dullness to percussion. Abdomin: Soft, nontender, nondistended with good bowel sounds.  No hepatosplenomegaly. Musculoskeletal: No joint deformity or effusion.  Full range of motion noted. Neurological: No deficits noted on motor, sensory and deep tendon reflex exam. Skin: No petechial rash or dryness.  Appeared moist.           Lab Results: Lab Results  Component Value Date   WBC 9.4 11/12/2021   HGB 12.3 (L) 11/12/2021   HCT 34.8 (L)  11/12/2021   MCV 93.8 11/12/2021   PLT 120 (L) 11/12/2021   PSA 0.10 01/17/2020     Chemistry      Component Value Date/Time   NA 127 (L) 11/12/2021 1231   NA 135 07/21/2020 1211   K 4.9 11/12/2021 1231   K 3.8 11/09/2013 0000   CL 95 (L) 11/12/2021 1231   CL 105 11/09/2013 0000   CO2 26 11/12/2021 1231   CO2 26 11/09/2013 0000   BUN 21 11/12/2021 1231   BUN 18 07/21/2020 1211   CREATININE 0.86 11/12/2021 1231   CREATININE 0.66 11/09/2013 0000      Component Value Date/Time   CALCIUM 9.8 11/12/2021 1231   CALCIUM 8.7 11/09/2013 0000   ALKPHOS 42 11/12/2021 1231   ALKPHOS 55 11/09/2013 0000   AST 27 11/12/2021 1231   ALT 35 11/12/2021 1231   BILITOT 0.8 11/12/2021 1231      IMPRESSION: 1. Compared to 08/14/2021, there is less fatty stippling within the pancreatic parenchyma, along with some expansion of the pancreatic head suspicious for acute pancreatitis. Pancreatic malignancy is considered less likely given the diffuse nature of the findings correlate with lipase levels. Similar appearance of small probable pseudocyst along the pancreatic tail and calcification along the atrophic pancreatic tail. 2. Some areas of improvement (left upper lobe) and some areas of worsening (lung bases) regarding the scattered consolidation and airspace opacity in the lungs. Multilobar pneumonia not excluded. This combined with background interstitial accentuation and striking emphysema makes assessment for metastatic disease problematic. There are some scattered pulmonary nodules some of which may be inflammatory based on their morphology, but some of which persist and are probably neoplastic. Nuclear medicine PET-CT may provide complimentary information. 3. Mild bilateral hilar and infrahilar adenopathy, increased from prior, nonspecific. 4. Stable 3.2 cm infrarenal abdominal aortic aneurysm. This can likely be followed in the context of the patient's follow up cancer imaging.  Otherwise, recommend follow up ultrasound every 3 years. This recommendation follows ACR consensus guidelines: White Paper of the ACR Incidental Findings Committee II on Vascular Findings. J Am Coll Radiol 2013; 10:789-794. 5. Other imaging findings of potential clinical significance: Coronary atherosclerosis with moderate cardiomegaly. Gastroesophageal reflux or dysmotility. Stable 5 mm enhancing lesion in the [name] hepatic lobe, probably a hemangioma or similar benign lesion. Left nephrectomy. Sigmoid colon diverticulosis. Prominent compression fractures at T12 and L1, as before. Substantial collateral vasculature along the stomach as before, indicating portal venous hypertension. Small uphill varices along the distal esophagus.   Aortic Atherosclerosis (ICD10-I70.0) and Emphysema (ICD10-J43.9).  Impression and Plan:  81 year old with:   1. Stage IV clear-cell renal cell carcinoma with sarcomatoid features and pulmonary involvement in June 2023.  CT scan obtained on 11/24/2021 was personally reviewed and discussed with the patient.  He appears to have a mixed response with some inflammatory changes that could be  related to immunotherapy.  Treatment options moving forward including restarting immunotherapy, oral targeted therapy with cabozantinib or axitinib versus a period of observation.  After discussion today, I recommended a period of observation and repeat imaging studies in the next 3 months.  If his CT scan shows progression of disease, I would consider starting therapy with oral targeted therapy alone.  The plan is to continue with active surveillance and he is agreeable with.   2.  Pulmonary nodules: Imaging studies did not show any specific changes.  Is likely represent metastatic disease and some are inflammatory.  We will continue to monitor on subsequent studies.  3.  [name] kidney cyst: Imaging studies did not show any evidence of malignancy and will continue to be  monitored.  4.  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  Repeat subsequent imaging studies would be recommended.  5.  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  No evidence of pneumonitis, colitis or hepatitis.  I will prescribe a short course of Medrol Dosepak to assist in his symptoms.  6.  Follow-up: In the next 4 to 6 weeks to follow his progress.      30  minutes were dedicated to this visit.  The time was spent on updating disease status, treatment choices and outlining future plan of care review.  Zola Button, MD 12/03/2021 8:11 AM

## 2021-12-05 NOTE — Telephone Encounter (Signed)
Prolia VOB initiated via [email or web address]  Last Prolia inj 07/09/21 Next Prolia inj due 01/10/22

## 2021-12-08 ENCOUNTER — Telehealth: Payer: Self-pay | Admitting: *Deleted

## 2021-12-08 NOTE — Telephone Encounter (Signed)
Frank [name] is completing his medrol dosepak. Pharmacy told him he has a refill. He states he is " a little better, still hoarse and not back 100%". Wants to know if he is supposed to get the refill.  Dr [name] states he can get refill. Pt notified

## 2021-12-14 DIAGNOSIS — H524 Presbyopia: Secondary | ICD-10-CM | POA: Diagnosis not present

## 2021-12-14 DIAGNOSIS — H2513 Age-related nuclear cataract, bilateral: Secondary | ICD-10-CM | POA: Diagnosis not present

## 2021-12-29 ENCOUNTER — Encounter: Payer: Self-pay | Admitting: Oncology

## 2021-12-29 ENCOUNTER — Other Ambulatory Visit (HOSPITAL_COMMUNITY): Payer: Self-pay

## 2021-12-29 ENCOUNTER — Ambulatory Visit (INDEPENDENT_AMBULATORY_CARE_PROVIDER_SITE_OTHER): Payer: Medicare Other | Admitting: Internal Medicine

## 2021-12-29 ENCOUNTER — Encounter: Payer: Self-pay | Admitting: Internal Medicine

## 2021-12-29 VITALS — BP 112/79 | HR 70 | Ht 65.0 in | Wt 167.0 lb

## 2021-12-29 DIAGNOSIS — R935 Abnormal findings on diagnostic imaging of other abdominal regions, including retroperitoneum: Secondary | ICD-10-CM

## 2021-12-29 DIAGNOSIS — J029 Acute pharyngitis, unspecified: Secondary | ICD-10-CM

## 2021-12-29 DIAGNOSIS — R490 Dysphonia: Secondary | ICD-10-CM

## 2021-12-29 DIAGNOSIS — K219 Gastro-esophageal reflux disease without esophagitis: Secondary | ICD-10-CM

## 2021-12-29 MED ORDER — ESOMEPRAZOLE MAGNESIUM 40 MG PO CPDR: 40.0000 mg | DELAYED_RELEASE_CAPSULE | Freq: Every day | ORAL | 3 refills | Status: DC

## 2021-12-29 NOTE — Patient Instructions (Signed)
_______________________________________________________  If you are age 80 or older, your body mass index should be between 23-30. Your Body mass index is 27.79 kg/m. If this is out of the aforementioned range listed, please consider follow up with your Primary Care Provider.  If you are age 34 or younger, your body mass index should be between 19-25. Your Body mass index is 27.79 kg/m. If this is out of the aformentioned range listed, please consider follow up with your Primary Care Provider.   ________________________________________________________  The [facility] providers would like to encourage you to use [facility] to communicate with providers for non-urgent requests or questions.  Due to long hold times on the telephone, sending your provider a message by [facility] may be a faster and more efficient way to get a response.  Please allow 48 business hours for a response.  Please remember that this is for non-urgent requests.  _______________________________________________________  We have sent the following medications to your pharmacy for you to pick up at your convenience:  Nexium  Please follow up in one year

## 2021-12-29 NOTE — Telephone Encounter (Signed)
Last prolia inj 5/11, next appt 11/15

## 2021-12-29 NOTE — Telephone Encounter (Signed)
Pharmacy Patient Advocate Encounter  Insurance verification completed.    The patient is insured through [employer]   Ran test claims for: Prolia 60mg .  Pharmacy benefit copay: $433.85

## 2021-12-29 NOTE — Progress Notes (Signed)
HISTORY OF PRESENT ILLNESS:  [name] is a 81 y.o. male with multiple medical problems who has been followed in this office for surveillance colonoscopy and GERD complicated by peptic stricture.  I last saw the patient March 25, 2021 for a routine checkup.  At that time he was taking Nexium 40 mg daily with good control of reflux symptoms.  No recurrent dysphagia.  See that dictation.  Since that time he was diagnosed with metastatic to the lungs renal cell carcinoma.  Originally diagnosed 2020.  Underwent 2 sessions of immunotherapy August 2023 including ipilimumab and nivolumab.  He tolerated the first session well.  He did not tolerate second session.  Developed problems with mouth and throat pain as well as hoarseness.  Saw ENT at [facility] Dr. Sabino Gasser who diagnosed drug-induced xerostomia.  Patient is felt poorly since his treatments.  Treatment is currently on hold.  He is here today with his wife.  He request medication refill for his reflux.  She questions whether any of the oropharyngeal complaints are related to his reflux.  He has been compliant with medication.  He denies dysphagia.  No esophageal pain with swallowing.  Review of blood work from November 03, 2021 reveals a sodium of 126.  Normal liver tests.  Hemoglobin 11.6.  CT scan from November 24, 2021 multiple findings as noted.  Question of subtle pancreatitis.  He denies abdominal pain.  REVIEW OF SYSTEMS:  All non-GI ROS negative unless otherwise stated in the HPI except for cough, fatigue, itching, hearing problems, voice change  Past Medical History:  Diagnosis Date   Abdominal aortic aneurysm (AAA) without rupture 06/23/2018   Age-related osteoporosis without current pathological fracture 07/14/2021   BPH (benign prostatic hyperplasia) 01/19/2010   Cervical radiculopathy 06/16/2018   Chronic anticoagulation 08/22/2018   Chronic bilateral low back pain without sciatica 07/18/2019   Chronic sinusitis 05/18/2021    Diverticulosis    Erectile dysfunction due to arterial insufficiency 04/23/2015   Esophageal stricture    Essential hypertension 05/01/2018   Estimated Creatinine Clearance: 44.1 mL/min (by C-G formula based on SCr of 1.35 mg/dL).   GERD (gastroesophageal reflux disease) 11/10/2010   Hearing loss    Hiatal hernia    Hyperlipidemia with target LDL less than 70 11/28/2012   The 10-year ASCVD risk score Mikey Bussing DC Jr., et al., 2013) is: 30.7%   Values used to calculate the score:     Age: 43 years     Sex: Male     Is Non-Hispanic African American: No     Diabetic: No     Tobacco smoker: No     Systolic Blood Pressure: 789 mmHg     Is BP treated: No     HDL Cholesterol: 30.8 mg/dL     Total Cholesterol: 183 mg/dL   Hypoglycemia    Iron deficiency anemia due to dietary causes 12/07/2018   Lung nodule seen on imaging study 06/23/2018   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 08/22/2018    a Medtronic Azure XT MRI conditional dual-chamber pacemaker for symptomatic sinus pauses, sick sinus syndrome, and syncope   Migraine headache    OAB (overactive bladder) 01/06/2016   PAF (paroxysmal atrial fibrillation)    Echo with normal LV function and mild LVH   Prediabetes 12/06/2018   Pure hyperglyceridemia 11/22/2007   Recurrent ventral incisional hernia 08/22/2018   Renal cell carcinoma of left kidney 01/15/2019   Sick sinus syndrome 07/18/2018   Stage 3b  chronic kidney disease 01/18/2020   Estimated Creatinine Clearance: 44.1 mL/min (by C-G formula based on SCr of 1.35 mg/dL).   Estimated Creatinine Clearance: 51.7 mL/min (by C-G formula based on SCr of 1.15 mg/dL).   Syncope    presented with atrial fib converted with Diltiazem   Systemic inflammatory response syndrome 06/26/2018   Thrombocytopenia 01/19/2021    Past Surgical History:  Procedure Laterality Date   CYSTOSCOPY WITH URETEROSCOPY AND STENT PLACEMENT Left 08/16/2018   Procedure: CYSTOSCOPY WITH LEFT  RETROGRADE PYELOGRAM, BALLOON DILATION LEFT URETER, STENT PLACEMENT;  Surgeon: Lucas Mallow, MD;  Location: [facility];  Service: Urology;  Laterality: Left;   HERNIA REPAIR     INSERT / REPLACE / REMOVE PACEMAKER     LAPAROSCOPIC ASSISTED VENTRAL HERNIA REPAIR N/A 10/18/2018   Procedure: LAPAROSCOPIC ASSISTED VENTRAL WALL HERNIA REPAIR WITH MESH;  Surgeon: Michael Boston, MD;  Location: [facility];  Service: General;  Laterality: N/A;   LAPAROSCOPIC NEPHRECTOMY, HAND ASSISTED Left 10/18/2018   Procedure: LEFT HAND ASSISTED LAPAROSCOPIC RADICAL NEPHRECTOMY WITH ADRENALECTOMY;  Surgeon: Lucas Mallow, MD;  Location: [facility];  Service: Urology;  Laterality: Left;   LOOP RECORDER INSERTION N/A 04/26/2018   Procedure: LOOP RECORDER INSERTION;  Surgeon: Thompson Grayer, MD;  Location: [facility];  Service: Cardiovascular;  Laterality: N/A;   LOOP RECORDER REMOVAL N/A 07/18/2018   Procedure: LOOP RECORDER REMOVAL;  Surgeon: Thompson Grayer, MD;  Location: [place] [facility];  Service: Cardiovascular;  Laterality: N/A;   NASAL SEPTUM SURGERY     PACEMAKER IMPLANT N/A 07/18/2018   Procedure: PACEMAKER IMPLANT;  Surgeon: Thompson Grayer, MD;  Location: [facility];  Service: Cardiovascular;  Laterality: N/A;   TONSILLECTOMY     TONSILLECTOMY     UMBILICAL HERNIA REPAIR  1993   WISDOM TOOTH EXTRACTION     about 81 years old    Social History [place].  reports that he quit smoking about 34 years ago. His smoking use included cigarettes. He has never used smokeless tobacco. He reports that he does not drink alcohol and does not use drugs.  family history includes Bipolar disorder in his paternal grandmother and sister; Dementia in his paternal grandmother; Emphysema in his father; Gallbladder disease in his sister; Memory loss in his paternal grandmother and sister.  Allergies  Allergen Reactions   Hydrocodone Other (See Comments)    Caused confusion    Monosodium Glutamate Other  (See Comments)    Caused headaches   Naproxen Sodium     Mouth blisters with prolonged use.  Left nephrectomy 2020   Other     Perfume - headaches        PHYSICAL EXAMINATION: Vital signs: BP 112/79   Pulse 70   Ht _0  (1.651 m)   Wt 167 lb (75.8 kg)   BMI 27.79 kg/m   Constitutional: generally well-appearing, no acute distress Psychiatric: alert and oriented x3, cooperative Eyes: extraocular movements intact, anicteric, conjunctiva pink Mouth: oral pharynx dry and erythematous, no focal or ulcerative lesions Neck: supple no lymphadenopathy Cardiovascular: heart regular rate and rhythm, no murmur Lungs: clear to auscultation bilaterally Abdomen: soft, nontender, nondistended, no obvious ascites, no peritoneal signs, normal bowel sounds, no organomegaly.  Midline incision healed.  Wound diastases Rectal: Omitted Extremities: no clubbing, or cyanosis.  Trace lower extremity edema bilaterally Skin: no lesions on visible extremities Neuro: No focal deficits.  Cranial nerves intact  ASSESSMENT:  1.  Drug-induced xerostomia hoarseness  2.  GERD.  Stable 3.  History of peptic stricture.  No recurrent dysphagia 4.  History of adenomatous colon polyps.  Aged out of surveillance 5.  Multiple medical problems including metastatic renal cell cancer 6.  Abnormal CT  PLAN:  1.  Refill Nexium 40 mg daily 2.  Reflux precautions 3.  Return to the care of your primary provider and oncology team A total time of 30 minutes was spent preparing to see the patient, reviewing the myriad of x-rays and laboratories as well as multiple specialty office notes.  Obtaining comprehensive history, performing medically appropriate physical examination, counseling the patient and his wife regarding the above listed issues, ordering medication, documenting clinical information in the health record

## 2021-12-31 NOTE — Telephone Encounter (Signed)
Pt ready for scheduling on or after 01/10/22   Out-of-pocket cost due at time of visit: $20   Primary: [facility] Medicare Prolia co-insurance: 20% (approximately $276) until max OOP $3600 is met, this has been met as of  12/30/2021 Admin fee co-insurance: $20   Secondary: n/a Prolia co-insurance:  Admin fee co-insurance:    Deductible: does not apply   Prior Auth: APPROVED PA# [id number] Valid 07/05/21-07/06/22   ** This summary of benefits is an estimation of the patient's out-of-pocket cost. Exact cost may vary based on individual plan coverage.

## 2022-01-11 ENCOUNTER — Other Ambulatory Visit: Payer: Self-pay | Admitting: Internal Medicine

## 2022-01-12 ENCOUNTER — Inpatient Hospital Stay (HOSPITAL_BASED_OUTPATIENT_CLINIC_OR_DEPARTMENT_OTHER): Payer: Medicare Other | Admitting: Oncology

## 2022-01-12 ENCOUNTER — Inpatient Hospital Stay: Payer: Medicare Other | Attending: Oncology

## 2022-01-12 VITALS — BP 115/60 | HR 61 | Temp 97.8°F | Resp 18 | Ht 65.0 in | Wt 169.1 lb

## 2022-01-12 DIAGNOSIS — C7970 Secondary malignant neoplasm of unspecified adrenal gland: Secondary | ICD-10-CM | POA: Insufficient documentation

## 2022-01-12 DIAGNOSIS — Z79899 Other long term (current) drug therapy: Secondary | ICD-10-CM | POA: Insufficient documentation

## 2022-01-12 DIAGNOSIS — Z7901 Long term (current) use of anticoagulants: Secondary | ICD-10-CM | POA: Diagnosis not present

## 2022-01-12 DIAGNOSIS — E079 Disorder of thyroid, unspecified: Secondary | ICD-10-CM | POA: Insufficient documentation

## 2022-01-12 DIAGNOSIS — C642 Malignant neoplasm of left kidney, except renal pelvis: Secondary | ICD-10-CM | POA: Insufficient documentation

## 2022-01-12 DIAGNOSIS — C7800 Secondary malignant neoplasm of unspecified lung: Secondary | ICD-10-CM | POA: Insufficient documentation

## 2022-01-12 LAB — CMP (CANCER CENTER ONLY)
ALT: 17 U/L (ref 0–44)
AST: 17 U/L (ref 15–41)
Albumin: 4.2 g/dL (ref 3.5–5.0)
Alkaline Phosphatase: 42 U/L (ref 38–126)
Anion gap: 5 (ref 5–15)
BUN: 15 mg/dL (ref 8–23)
CO2: 28 mmol/L (ref 22–32)
Calcium: 9.7 mg/dL (ref 8.9–10.3)
Chloride: 99 mmol/L (ref 98–111)
Creatinine: 0.92 mg/dL (ref 0.61–1.24)
GFR, Estimated: >60 mL/min (ref >60)
Glucose, Bld: 144 mg/dL — ABNORMAL HIGH (ref 70–99)
Potassium: 4.2 mmol/L (ref 3.5–5.1)
Sodium: 132 mmol/L — ABNORMAL LOW (ref 135–145)
Total Bilirubin: 0.6 mg/dL (ref 0.3–1.2)
Total Protein: 7.1 g/dL (ref 6.5–8.1)

## 2022-01-12 LAB — CBC WITH DIFFERENTIAL (CANCER CENTER ONLY)
Abs Immature Granulocytes: 0.03 10*3/uL (ref 0.00–0.07)
Basophils Absolute: 0 10*3/uL (ref 0.0–0.1)
Basophils Relative: 0 %
Eosinophils Absolute: 0 10*3/uL (ref 0.0–0.5)
Eosinophils Relative: 0 %
HCT: 38.2 % — ABNORMAL LOW (ref 39.0–52.0)
Hemoglobin: 13.6 g/dL (ref 13.0–17.0)
Immature Granulocytes: 1 %
Lymphocytes Relative: 11 %
Lymphs Abs: 0.6 10*3/uL — ABNORMAL LOW (ref 0.7–4.0)
MCH: 34.4 pg — ABNORMAL HIGH (ref 26.0–34.0)
MCHC: 35.6 g/dL (ref 30.0–36.0)
MCV: 96.7 fL (ref 80.0–100.0)
Monocytes Absolute: 0.3 10*3/uL (ref 0.1–1.0)
Monocytes Relative: 7 %
Neutro Abs: 4.1 10*3/uL (ref 1.7–7.7)
Neutrophils Relative %: 81 %
Platelet Count: 91 10*3/uL — ABNORMAL LOW (ref 150–400)
RBC: 3.95 MIL/uL — ABNORMAL LOW (ref 4.22–5.81)
RDW: 13.7 % (ref 11.5–15.5)
WBC Count: 5.1 10*3/uL (ref 4.0–10.5)
nRBC: 0 % (ref 0.0–0.2)

## 2022-01-12 LAB — TSH: TSH: 1.704 u[IU]/mL (ref 0.350–4.500)

## 2022-01-12 NOTE — Progress Notes (Signed)
Hematology and Oncology Follow Up   Frank [name] [id number] 03-Jul-1940 81 y.o. 01/12/2022 9:30 AM [name], MDJones, [name], MD      Principle Diagnosis: 81 year old man with kidney cancer diagnosed in 2020.  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     Prior Therapy:  He is status post left radical nephrectomy completed by Dr. Gloriann Loan on October 18, 2018.  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   Ipilimumab 1 mg/kg and nivolumab 3 mg/kg started on October 01, 2021.  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  Current therapy: Active surveillance.  Interim History: Frank [name] is here for a follow-up visit.  Since the last visit, he reports no major changes in his health.  He continues to show slow recovery from the treatment side effects.  His complaints predominantly related to sore throat and dysphagia which is improving slowly.  He has lost some weight but overall nutritional status has improved.  He denies excessive fatigue, tiredness or weakness.  He denies any shortness of breath or difficulty breathing.  He does report some occasional cough and sore throat.   Medications: Reviewed without changes. Current Outpatient Medications  Medication Sig Dispense Refill   amoxicillin-clavulanate (AUGMENTIN) 875-125 MG tablet Take 1 tablet by mouth 2 (two) times daily. 20 tablet 0   apixaban (ELIQUIS) 5 MG TABS tablet Take 1 tablet by mouth twice daily 180 tablet 1   Calcium Carbonate (CALCIUM 500 PO) Take 500 mg by mouth 2 (two) times daily.     denosumab (PROLIA) 60 MG/ML SOSY injection Inject 60 mg into the skin every 6 (six) months.     esomeprazole (NEXIUM) 40 MG capsule Take 1 capsule (40 mg total) by mouth daily before breakfast. 90 capsule 3   methylPREDNISolone (MEDROL DOSEPAK) 4 MG  TBPK tablet Take 6 tabs day one, 5 tabs day two, take 4 tabs day three, take 3 tabs day four, take 2 tabs day five and one tab day six. (Patient not taking: Reported on 12/29/2021) 21 tablet 1   Multiple Vitamins-Minerals (CENTRUM MULTI + OMEGA 3 PO) Take 1 tablet by mouth daily.      omega-3 acid ethyl esters (LOVAZA) 1 g capsule Take 2 capsules by mouth twice daily 360 capsule 1   prochlorperazine (COMPAZINE) 10 MG tablet Take 1 tablet (10 mg total) by mouth every 6 (six) hours as needed for nausea or vomiting. (Patient not taking: Reported on 12/29/2021) 30 tablet 0   rosuvastatin (CRESTOR) 10 MG tablet Take 1 tablet by mouth once daily 90 tablet 0   sildenafil (VIAGRA) 100 MG tablet Take 100 mg by mouth daily as needed for erectile dysfunction.     solifenacin (VESICARE) 10 MG tablet Take 1 tablet by mouth once daily 90 tablet 0   Tiotropium Bromide-Olodaterol (STIOLTO RESPIMAT) 2.5-2.5 MCG/ACT AERS Inhale 2 puffs into the lungs daily. 12 g 1   VITAMIN D PO Take 1,000 Units by mouth daily.     No current facility-administered medications for this visit.     Allergies:  Allergies  Allergen Reactions   Hydrocodone Other (See Comments)    Caused confusion    Monosodium Glutamate Other (See Comments)    Caused headaches   Naproxen Sodium     Mouth blisters with prolonged use.  Left nephrectomy 2020   Other  Perfume - headaches    Physical exam:       Blood pressure 115/60, pulse 61, temperature 97.8 F (36.6 C), temperature source Temporal, resp. rate 18, height 5\' 5"  (1.651 m), weight 169 lb 1.6 oz (76.7 kg), SpO2 97 %.      ECOG 1   General appearance: Alert, awake without any distress. Head: Atraumatic without abnormalities Oropharynx: Without any thrush or ulcers. Eyes: No scleral icterus. Lymph nodes: No lymphadenopathy noted in the cervical, supraclavicular, or axillary nodes Heart:regular rate and rhythm, without any murmurs or gallops.   Lung: Clear to  auscultation without any rhonchi, wheezes or dullness to percussion. Abdomin: Soft, nontender without any shifting dullness or ascites. Musculoskeletal: No clubbing or cyanosis. Neurological: No motor or sensory deficits. Skin: No rashes or lesions.          Lab Results: Lab Results  Component Value Date   WBC 5.2 12/03/2021   HGB 11.6 (L) 12/03/2021   HCT 33.1 (L) 12/03/2021   MCV 95.1 12/03/2021   PLT 105 (L) 12/03/2021   PSA 0.10 01/17/2020     Chemistry      Component Value Date/Time   NA 126 (L) 12/03/2021 0756   NA 135 07/21/2020 1211   K 4.1 12/03/2021 0756   K 3.8 11/09/2013 0000   CL 96 (L) 12/03/2021 0756   CL 105 11/09/2013 0000   CO2 25 12/03/2021 0756   CO2 26 11/09/2013 0000   BUN 15 12/03/2021 0756   BUN 18 07/21/2020 1211   CREATININE 0.89 12/03/2021 0756   CREATININE 0.66 11/09/2013 0000      Component Value Date/Time   CALCIUM 9.4 12/03/2021 0756   CALCIUM 8.7 11/09/2013 0000   ALKPHOS 39 12/03/2021 0756   ALKPHOS 55 11/09/2013 0000   AST 17 12/03/2021 0756   ALT 20 12/03/2021 0756   BILITOT 0.8 12/03/2021 0756       Impression and Plan:  81 year old with:   1.  Clear-cell renal cell carcinoma with pulmonary involvement diagnosed in June 2023.  He was found to have stage IV disease.  He is currently on active surveillance and opted against additional treatment given his autoimmune complications and overall loss minimal disease progression and predominantly stable disease.  I recommended continued active surveillance at this time and we will repeat imaging studies in December 2023.  Different salvage therapy options including oral targeted therapy with cabozantinib or axitinib among others.  He is agreeable with this plan and we will update his staging scan before the next visit.   2.  Pancreatic involvement: He will have repeat imaging studies in 4 weeks to assess the status of these pancreatic lesions.  3.  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  4.  Nutritional considerations: He continues to struggle with his weight and nutritional intake.  He is using nutritional supplements but have lost some weight.  We will arrange for dietary referral.  5.  Follow-up: No evidence in 4 weeks for repeat evaluation and imaging studies.      30  minutes were spent on this encounter.  The time was dedicated to reviewing laboratory data, disease status update and updating future plan of care discussion.  Zola Button, MD 01/12/2022 9:30 AM

## 2022-01-13 ENCOUNTER — Ambulatory Visit (INDEPENDENT_AMBULATORY_CARE_PROVIDER_SITE_OTHER): Payer: Medicare Other

## 2022-01-13 DIAGNOSIS — Z950 Presence of cardiac pacemaker: Secondary | ICD-10-CM

## 2022-01-13 DIAGNOSIS — I495 Sick sinus syndrome: Secondary | ICD-10-CM

## 2022-01-14 LAB — CUP PACEART REMOTE DEVICE CHECK
Battery Remaining Longevity: 132 mo
Battery Voltage: 3.01 V
Brady Statistic AP VP Percent: 0.04 %
Brady Statistic AP VS Percent: 43.92 %
Brady Statistic AS VP Percent: 0.01 %
Brady Statistic AS VS Percent: 56.02 %
Brady Statistic RA Percent Paced: 44.1 %
Brady Statistic RV Percent Paced: 0.06 %
Date Time Interrogation Session: 20231115162632
Implantable Lead Connection Status: 753985
Implantable Lead Connection Status: 753985
Implantable Lead Implant Date: 20200519
Implantable Lead Implant Date: 20200519
Implantable Lead Location: 753859
Implantable Lead Location: 753860
Implantable Lead Model: 5076
Implantable Lead Model: 5076
Implantable Pulse Generator Implant Date: 20200519
Lead Channel Impedance Value: 323 Ohm
Lead Channel Impedance Value: 399 Ohm
Lead Channel Impedance Value: 513 Ohm
Lead Channel Impedance Value: 551 Ohm
Lead Channel Pacing Threshold Amplitude: 0.5 V
Lead Channel Pacing Threshold Amplitude: 0.5 V
Lead Channel Pacing Threshold Pulse Width: 0.4 ms
Lead Channel Pacing Threshold Pulse Width: 0.4 ms
Lead Channel Sensing Intrinsic Amplitude: 4.375 mV
Lead Channel Sensing Intrinsic Amplitude: 4.375 mV
Lead Channel Sensing Intrinsic Amplitude: 5.375 mV
Lead Channel Sensing Intrinsic Amplitude: 5.375 mV
Lead Channel Setting Pacing Amplitude: 1.5 V
Lead Channel Setting Pacing Amplitude: 2.5 V
Lead Channel Setting Pacing Pulse Width: 0.4 ms
Lead Channel Setting Sensing Sensitivity: 2 mV
Zone Setting Status: 755011
Zone Setting Status: 755011

## 2022-01-19 ENCOUNTER — Encounter: Payer: Self-pay | Admitting: Oncology

## 2022-01-26 ENCOUNTER — Other Ambulatory Visit: Payer: Self-pay

## 2022-01-26 ENCOUNTER — Inpatient Hospital Stay: Payer: Medicare Other | Admitting: Nutrition

## 2022-01-26 NOTE — Progress Notes (Signed)
81 year old male diagnosed with stage IV renal cell cancer under active surveillance followed by Dr. [name].  Past medical history includes left nephrectomy, AAA, esophageal strictures, GERD, iron deficiency, and prediabetes.  Medications include calcium, Nexium, multivitamin, omega-3 fatty acids, and Compazine.  Labs include sodium 132 and glucose 144 on November 14.  Height: 65 inches. Weight: 167.6 pounds. Usual body weight: 180 pounds in August. BMI: 27.89.  Estimated nutrition needs: 2100-2300 cal, 90-110 g protein, greater than 2 L fluid.  Patient reports he has difficulty chewing and swallowing.  His mouth is very dry and his throat is sore.  He has taste alterations and poor appetite.  He is only able to eat small amounts at one time.  He tries to drink 2 oral nutrition supplements daily, one of them being Premier protein only providing 150 cal and 30 g of protein.  He uses equate for the other protein shake however they are unclear specifically which Equate product they are purchasing.  For breakfast he will have cereal or grits with bacon.  At lunch she generally eats soup or cheese sandwich.  For dinner he consumes chicken and a vegetable.  He reports he is not much of a snacker.  He is frustrated that he has not been able to gain weight.  Nutrition diagnosis: Unintended weight loss related to renal cell cancer and associated treatments as evidenced by 7% weight loss over 3 months which is not clinically significant however it is concerning.  Intervention: Educated patient on importance of small frequent meals and snacks utilizing higher calorie higher protein foods. Recommended Ensure plus high-protein or Ensure complete 3 times daily between meals.  Provided samples. Educated patient on strategies for improving taste alterations.  Strongly encouraged baking soda and salt water rinses. Reviewed softer foods and the addition of gravies or sauces to help with  swallowing.  Monitoring, evaluation, goals: Patient will tolerate increased calories and protein to minimize weight loss.  Next visit: No follow-up needed.  Patient has contact information for questions or concerns.  **Disclaimer: This note was dictated with voice recognition software. Similar sounding words can inadvertently be transcribed and this note may contain transcription errors which may not have been corrected upon publication of note.**

## 2022-01-27 ENCOUNTER — Encounter: Payer: Self-pay | Admitting: Internal Medicine

## 2022-01-28 ENCOUNTER — Telehealth: Payer: Self-pay | Admitting: Oncology

## 2022-01-28 NOTE — Telephone Encounter (Signed)
Called patient regarding December appointments, patient is notified.

## 2022-02-04 ENCOUNTER — Ambulatory Visit (INDEPENDENT_AMBULATORY_CARE_PROVIDER_SITE_OTHER): Payer: Medicare Other

## 2022-02-04 DIAGNOSIS — M810 Age-related osteoporosis without current pathological fracture: Secondary | ICD-10-CM | POA: Diagnosis not present

## 2022-02-04 MED ORDER — DENOSUMAB 60 MG/ML ~~LOC~~ SOSY
60.0000 mg | PREFILLED_SYRINGE | Freq: Once | SUBCUTANEOUS | Status: AC
  Administered 2022-02-04: 60 mg via SUBCUTANEOUS

## 2022-02-04 NOTE — Progress Notes (Signed)
After obtaining consent, and per orders of [name], injection of prolia was given in the right arm subcut by Marrian Salvage. Patient instructed to report any adverse reaction to me immediately.

## 2022-02-05 ENCOUNTER — Telehealth: Payer: Self-pay | Admitting: Cardiovascular Disease

## 2022-02-05 DIAGNOSIS — T50B95A Adverse effect of other viral vaccines, initial encounter: Secondary | ICD-10-CM | POA: Diagnosis not present

## 2022-02-05 DIAGNOSIS — T50905A Adverse effect of unspecified drugs, medicaments and biological substances, initial encounter: Secondary | ICD-10-CM | POA: Diagnosis not present

## 2022-02-05 DIAGNOSIS — G44209 Tension-type headache, unspecified, not intractable: Secondary | ICD-10-CM | POA: Diagnosis not present

## 2022-02-05 DIAGNOSIS — K117 Disturbances of salivary secretion: Secondary | ICD-10-CM | POA: Diagnosis not present

## 2022-02-05 DIAGNOSIS — Z7901 Long term (current) use of anticoagulants: Secondary | ICD-10-CM | POA: Diagnosis not present

## 2022-02-05 DIAGNOSIS — R040 Epistaxis: Secondary | ICD-10-CM | POA: Diagnosis not present

## 2022-02-05 DIAGNOSIS — T887XXA Unspecified adverse effect of drug or medicament, initial encounter: Secondary | ICD-10-CM | POA: Diagnosis not present

## 2022-02-05 NOTE — Progress Notes (Signed)
Remote pacemaker transmission.   

## 2022-02-05 NOTE — Telephone Encounter (Signed)
Spoke to patient. Reports his nose was bleeding so he did not take his morning Eliquis. He also uses saline nasal spray for the dry nose. Post Covid shot he had bad headache so he took couple naproxen few days ago. Reports bleeding has stopped but as soon as he blow his nose hard or pick at the scab it start bleeding again   Patient has been educated on importance of adherence of Eliquis. Suggest to restart Eliquis; was going out for an appointment so will resume Eliquis from tonight. Advice not to use any OTC NSAIDs with Eliquis. Take Tylenol for minor aches/pain or headaches instead. Also,advice to switch saline nasal spray to saline nasal drops to avoid nasal septum irritation, start using humidifier in the house to avoid excessive dry nose  I spent around 30 min to address all his questions and concerns

## 2022-02-05 NOTE — Telephone Encounter (Signed)
Pt c/o medication issue:  1. Name of Medication:   apixaban (ELIQUIS) 5 MG TABS tablet   2. How are you currently taking this medication (dosage and times per day)?   As prescribed until yesterday  3. Are you having a reaction (difficulty breathing--STAT)?  No  4. What is your medication issue?   Patient stated he got a COVID shot at [facility] a few days ago and he had a bad reaction.  His shoulder turned yellow, he had a rash, had a nose bleed and he had a bad headache. Patient stated he took medication with aspirin and he has been having nose bleeds.  Patient wants to know if he should still take his Eliquis.  Patient stated he also has dry mouth.

## 2022-02-06 ENCOUNTER — Encounter (HOSPITAL_COMMUNITY): Payer: Self-pay

## 2022-02-06 ENCOUNTER — Other Ambulatory Visit: Payer: Self-pay

## 2022-02-06 ENCOUNTER — Emergency Department (HOSPITAL_COMMUNITY): Payer: Medicare Other

## 2022-02-06 ENCOUNTER — Inpatient Hospital Stay (HOSPITAL_COMMUNITY)
Admission: EM | Admit: 2022-02-06 | Discharge: 2022-02-13 | DRG: 064 | Disposition: A | Discharge status: Home / Self Care | Payer: Medicare Other | Attending: Internal Medicine | Admitting: Internal Medicine

## 2022-02-06 DIAGNOSIS — J439 Emphysema, unspecified: Secondary | ICD-10-CM | POA: Diagnosis present

## 2022-02-06 DIAGNOSIS — E43 Unspecified severe protein-calorie malnutrition: Secondary | ICD-10-CM | POA: Diagnosis not present

## 2022-02-06 DIAGNOSIS — Z7189 Other specified counseling: Secondary | ICD-10-CM | POA: Diagnosis not present

## 2022-02-06 DIAGNOSIS — J9601 Acute respiratory failure with hypoxia: Secondary | ICD-10-CM | POA: Diagnosis not present

## 2022-02-06 DIAGNOSIS — R0902 Hypoxemia: Secondary | ICD-10-CM | POA: Diagnosis not present

## 2022-02-06 DIAGNOSIS — G8929 Other chronic pain: Secondary | ICD-10-CM | POA: Diagnosis present

## 2022-02-06 DIAGNOSIS — Z885 Allergy status to narcotic agent status: Secondary | ICD-10-CM

## 2022-02-06 DIAGNOSIS — D6832 Hemorrhagic disorder due to extrinsic circulating anticoagulants: Secondary | ICD-10-CM | POA: Diagnosis present

## 2022-02-06 DIAGNOSIS — J8410 Pulmonary fibrosis, unspecified: Secondary | ICD-10-CM | POA: Diagnosis not present

## 2022-02-06 DIAGNOSIS — C7800 Secondary malignant neoplasm of unspecified lung: Secondary | ICD-10-CM | POA: Diagnosis present

## 2022-02-06 DIAGNOSIS — Z905 Acquired absence of kidney: Secondary | ICD-10-CM

## 2022-02-06 DIAGNOSIS — D693 Immune thrombocytopenic purpura: Secondary | ICD-10-CM | POA: Diagnosis not present

## 2022-02-06 DIAGNOSIS — Z781 Physical restraint status: Secondary | ICD-10-CM

## 2022-02-06 DIAGNOSIS — Z66 Do not resuscitate: Secondary | ICD-10-CM | POA: Diagnosis not present

## 2022-02-06 DIAGNOSIS — Z7901 Long term (current) use of anticoagulants: Secondary | ICD-10-CM | POA: Insufficient documentation

## 2022-02-06 DIAGNOSIS — Z950 Presence of cardiac pacemaker: Secondary | ICD-10-CM

## 2022-02-06 DIAGNOSIS — R519 Headache, unspecified: Secondary | ICD-10-CM | POA: Diagnosis not present

## 2022-02-06 DIAGNOSIS — F05 Delirium due to known physiological condition: Secondary | ICD-10-CM | POA: Diagnosis not present

## 2022-02-06 DIAGNOSIS — D630 Anemia in neoplastic disease: Secondary | ICD-10-CM | POA: Diagnosis not present

## 2022-02-06 DIAGNOSIS — C342 Malignant neoplasm of middle lobe, bronchus or lung: Secondary | ICD-10-CM | POA: Diagnosis not present

## 2022-02-06 DIAGNOSIS — E871 Hypo-osmolality and hyponatremia: Secondary | ICD-10-CM | POA: Diagnosis not present

## 2022-02-06 DIAGNOSIS — I6200 Nontraumatic subdural hemorrhage, unspecified: Secondary | ICD-10-CM | POA: Diagnosis not present

## 2022-02-06 DIAGNOSIS — Z85528 Personal history of other malignant neoplasm of kidney: Secondary | ICD-10-CM

## 2022-02-06 DIAGNOSIS — Z515 Encounter for palliative care: Secondary | ICD-10-CM | POA: Diagnosis not present

## 2022-02-06 DIAGNOSIS — I10 Essential (primary) hypertension: Secondary | ICD-10-CM | POA: Diagnosis not present

## 2022-02-06 DIAGNOSIS — E781 Pure hyperglyceridemia: Secondary | ICD-10-CM | POA: Diagnosis present

## 2022-02-06 DIAGNOSIS — I6381 Other cerebral infarction due to occlusion or stenosis of small artery: Secondary | ICD-10-CM | POA: Diagnosis not present

## 2022-02-06 DIAGNOSIS — I714 Abdominal aortic aneurysm, without rupture, unspecified: Secondary | ICD-10-CM | POA: Diagnosis present

## 2022-02-06 DIAGNOSIS — S065XAA Traumatic subdural hemorrhage with loss of consciousness status unknown, initial encounter: Principal | ICD-10-CM | POA: Diagnosis present

## 2022-02-06 DIAGNOSIS — C7889 Secondary malignant neoplasm of other digestive organs: Secondary | ICD-10-CM | POA: Diagnosis present

## 2022-02-06 DIAGNOSIS — N1832 Chronic kidney disease, stage 3b: Secondary | ICD-10-CM | POA: Diagnosis not present

## 2022-02-06 DIAGNOSIS — R627 Adult failure to thrive: Secondary | ICD-10-CM | POA: Diagnosis not present

## 2022-02-06 DIAGNOSIS — R21 Rash and other nonspecific skin eruption: Secondary | ICD-10-CM | POA: Diagnosis not present

## 2022-02-06 DIAGNOSIS — Z79899 Other long term (current) drug therapy: Secondary | ICD-10-CM

## 2022-02-06 DIAGNOSIS — M810 Age-related osteoporosis without current pathological fracture: Secondary | ICD-10-CM | POA: Diagnosis present

## 2022-02-06 DIAGNOSIS — I6201 Nontraumatic acute subdural hemorrhage: Secondary | ICD-10-CM | POA: Diagnosis not present

## 2022-02-06 DIAGNOSIS — J810 Acute pulmonary edema: Secondary | ICD-10-CM | POA: Diagnosis not present

## 2022-02-06 DIAGNOSIS — R4701 Aphasia: Secondary | ICD-10-CM | POA: Diagnosis present

## 2022-02-06 DIAGNOSIS — Z6827 Body mass index (BMI) 27.0-27.9, adult: Secondary | ICD-10-CM

## 2022-02-06 DIAGNOSIS — J811 Chronic pulmonary edema: Secondary | ICD-10-CM | POA: Diagnosis not present

## 2022-02-06 DIAGNOSIS — I495 Sick sinus syndrome: Secondary | ICD-10-CM | POA: Diagnosis not present

## 2022-02-06 DIAGNOSIS — T45515A Adverse effect of anticoagulants, initial encounter: Secondary | ICD-10-CM | POA: Diagnosis present

## 2022-02-06 DIAGNOSIS — C7970 Secondary malignant neoplasm of unspecified adrenal gland: Secondary | ICD-10-CM | POA: Diagnosis not present

## 2022-02-06 DIAGNOSIS — C649 Malignant neoplasm of unspecified kidney, except renal pelvis: Secondary | ICD-10-CM | POA: Diagnosis present

## 2022-02-06 DIAGNOSIS — K219 Gastro-esophageal reflux disease without esophagitis: Secondary | ICD-10-CM | POA: Diagnosis present

## 2022-02-06 DIAGNOSIS — J690 Pneumonitis due to inhalation of food and vomit: Secondary | ICD-10-CM | POA: Diagnosis not present

## 2022-02-06 DIAGNOSIS — Z87891 Personal history of nicotine dependence: Secondary | ICD-10-CM

## 2022-02-06 DIAGNOSIS — I6203 Nontraumatic chronic subdural hemorrhage: Secondary | ICD-10-CM | POA: Diagnosis not present

## 2022-02-06 DIAGNOSIS — R040 Epistaxis: Secondary | ICD-10-CM | POA: Insufficient documentation

## 2022-02-06 DIAGNOSIS — Z886 Allergy status to analgesic agent status: Secondary | ICD-10-CM

## 2022-02-06 DIAGNOSIS — I480 Paroxysmal atrial fibrillation: Secondary | ICD-10-CM | POA: Diagnosis present

## 2022-02-06 DIAGNOSIS — I129 Hypertensive chronic kidney disease with stage 1 through stage 4 chronic kidney disease, or unspecified chronic kidney disease: Secondary | ICD-10-CM | POA: Diagnosis not present

## 2022-02-06 DIAGNOSIS — M545 Low back pain, unspecified: Secondary | ICD-10-CM | POA: Diagnosis present

## 2022-02-06 DIAGNOSIS — R008 Other abnormalities of heart beat: Secondary | ICD-10-CM | POA: Diagnosis not present

## 2022-02-06 DIAGNOSIS — R451 Restlessness and agitation: Secondary | ICD-10-CM | POA: Diagnosis not present

## 2022-02-06 DIAGNOSIS — Z888 Allergy status to other drugs, medicaments and biological substances status: Secondary | ICD-10-CM

## 2022-02-06 DIAGNOSIS — R410 Disorientation, unspecified: Secondary | ICD-10-CM | POA: Diagnosis not present

## 2022-02-06 DIAGNOSIS — H9190 Unspecified hearing loss, unspecified ear: Secondary | ICD-10-CM | POA: Diagnosis present

## 2022-02-06 DIAGNOSIS — J90 Pleural effusion, not elsewhere classified: Secondary | ICD-10-CM | POA: Diagnosis not present

## 2022-02-06 DIAGNOSIS — T50B95A Adverse effect of other viral vaccines, initial encounter: Secondary | ICD-10-CM | POA: Insufficient documentation

## 2022-02-06 DIAGNOSIS — J329 Chronic sinusitis, unspecified: Secondary | ICD-10-CM | POA: Diagnosis present

## 2022-02-06 DIAGNOSIS — D696 Thrombocytopenia, unspecified: Secondary | ICD-10-CM | POA: Diagnosis not present

## 2022-02-06 DIAGNOSIS — S065X0A Traumatic subdural hemorrhage without loss of consciousness, initial encounter: Secondary | ICD-10-CM | POA: Diagnosis not present

## 2022-02-06 DIAGNOSIS — N400 Enlarged prostate without lower urinary tract symptoms: Secondary | ICD-10-CM | POA: Diagnosis present

## 2022-02-06 LAB — COMPREHENSIVE METABOLIC PANEL
ALT: 21 U/L (ref 0–44)
ALT: 23 U/L (ref 0–44)
AST: 22 U/L (ref 15–41)
AST: 26 U/L (ref 15–41)
Albumin: 3.5 g/dL (ref 3.5–5.0)
Albumin: 3.1 g/dL — ABNORMAL LOW (ref 3.5–5.0)
Alkaline Phosphatase: 40 U/L (ref 38–126)
Alkaline Phosphatase: 37 U/L — ABNORMAL LOW (ref 38–126)
Anion gap: 8 (ref 5–15)
Anion gap: 10 (ref 5–15)
BUN: 19 mg/dL (ref 8–23)
BUN: 14 mg/dL (ref 8–23)
CO2: 24 mmol/L (ref 22–32)
CO2: 20 mmol/L — ABNORMAL LOW (ref 22–32)
Calcium: 9 mg/dL (ref 8.9–10.3)
Calcium: 8 mg/dL — ABNORMAL LOW (ref 8.9–10.3)
Chloride: 98 mmol/L (ref 98–111)
Chloride: 97 mmol/L — ABNORMAL LOW (ref 98–111)
Creatinine, Ser: 0.95 mg/dL (ref 0.61–1.24)
Creatinine, Ser: 0.79 mg/dL (ref 0.61–1.24)
GFR, Estimated: >60 mL/min (ref >60)
GFR, Estimated: >60 mL/min (ref >60)
Glucose, Bld: 173 mg/dL — ABNORMAL HIGH (ref 70–99)
Glucose, Bld: 182 mg/dL — ABNORMAL HIGH (ref 70–99)
Potassium: 3.9 mmol/L (ref 3.5–5.1)
Potassium: 3.5 mmol/L (ref 3.5–5.1)
Sodium: 130 mmol/L — ABNORMAL LOW (ref 135–145)
Sodium: 127 mmol/L — ABNORMAL LOW (ref 135–145)
Total Bilirubin: 1 mg/dL (ref 0.3–1.2)
Total Bilirubin: 0.8 mg/dL (ref 0.3–1.2)
Total Protein: 7.3 g/dL (ref 6.5–8.1)
Total Protein: 6.3 g/dL — ABNORMAL LOW (ref 6.5–8.1)

## 2022-02-06 LAB — CBC
HCT: 29.7 % — ABNORMAL LOW (ref 39.0–52.0)
Hemoglobin: 10.6 g/dL — ABNORMAL LOW (ref 13.0–17.0)
MCH: 33.3 pg (ref 26.0–34.0)
MCHC: 35.7 g/dL (ref 30.0–36.0)
MCV: 93.4 fL (ref 80.0–100.0)
Platelets: 5 10*3/uL — CL (ref 150–400)
RBC: 3.18 MIL/uL — ABNORMAL LOW (ref 4.22–5.81)
RDW: 13.5 % (ref 11.5–15.5)
WBC: 5.5 10*3/uL (ref 4.0–10.5)
nRBC: 0 % (ref 0.0–0.2)

## 2022-02-06 LAB — CBC WITH DIFFERENTIAL/PLATELET
Abs Immature Granulocytes: 0.12 10*3/uL — ABNORMAL HIGH (ref 0.00–0.07)
Basophils Absolute: 0 10*3/uL (ref 0.0–0.1)
Basophils Relative: 0 %
Eosinophils Absolute: 0 10*3/uL (ref 0.0–0.5)
Eosinophils Relative: 0 %
HCT: 34.9 % — ABNORMAL LOW (ref 39.0–52.0)
Hemoglobin: 12 g/dL — ABNORMAL LOW (ref 13.0–17.0)
Immature Granulocytes: 2 %
Lymphocytes Relative: 9 %
Lymphs Abs: 0.7 10*3/uL (ref 0.7–4.0)
MCH: 32.8 pg (ref 26.0–34.0)
MCHC: 34.4 g/dL (ref 30.0–36.0)
MCV: 95.4 fL (ref 80.0–100.0)
Monocytes Absolute: 0.6 10*3/uL (ref 0.1–1.0)
Monocytes Relative: 8 %
Neutro Abs: 5.6 10*3/uL (ref 1.7–7.7)
Neutrophils Relative %: 81 %
Platelets: <5 10*3/uL — CL (ref 150–400)
RBC: 3.66 MIL/uL — ABNORMAL LOW (ref 4.22–5.81)
RDW: 13.6 % (ref 11.5–15.5)
WBC: 7 10*3/uL (ref 4.0–10.5)
nRBC: 0 % (ref 0.0–0.2)

## 2022-02-06 LAB — PROTIME-INR
INR: 1.4 — ABNORMAL HIGH (ref 0.8–1.2)
Prothrombin Time: 16.7 seconds — ABNORMAL HIGH (ref 11.4–15.2)

## 2022-02-06 LAB — URINALYSIS, ROUTINE W REFLEX MICROSCOPIC
Bilirubin Urine: NEGATIVE
Glucose, UA: 50 mg/dL — AB
Hgb urine dipstick: NEGATIVE
Ketones, ur: NEGATIVE mg/dL
Leukocytes,Ua: NEGATIVE
Nitrite: NEGATIVE
Protein, ur: NEGATIVE mg/dL
Specific Gravity, Urine: 1.013 (ref 1.005–1.030)
pH: 7 (ref 5.0–8.0)

## 2022-02-06 LAB — MRSA NEXT GEN BY PCR, NASAL: MRSA by PCR Next Gen: NOT DETECTED

## 2022-02-06 LAB — MAGNESIUM: Magnesium: 1.8 mg/dL (ref 1.7–2.4)

## 2022-02-06 LAB — TYPE AND SCREEN
ABO/RH(D): A POS
Antibody Screen: POSITIVE
DAT, IgG: NEGATIVE

## 2022-02-06 MED ORDER — SODIUM CHLORIDE 0.9% IV SOLUTION: Freq: Once | INTRAVENOUS | Status: AC

## 2022-02-06 MED ORDER — CHLORHEXIDINE GLUCONATE CLOTH 2 % EX PADS
6.0000 | MEDICATED_PAD | Freq: Every day | CUTANEOUS | Status: DC
  Administered 2022-02-08 – 2022-02-11 (×4): 6 via TOPICAL

## 2022-02-06 MED ORDER — DOCUSATE SODIUM 100 MG PO CAPS: 100.0000 mg | ORAL_CAPSULE | Freq: Two times a day (BID) | ORAL | Status: DC | PRN

## 2022-02-06 MED ORDER — ONDANSETRON HCL 4 MG/2ML IJ SOLN
4.0000 mg | Freq: Three times a day (TID) | INTRAMUSCULAR | Status: DC | PRN
  Administered 2022-02-07 (×2): 4 mg via INTRAVENOUS
  Filled 2022-02-06 (×3): qty 2

## 2022-02-06 MED ORDER — EMPTY CONTAINERS FLEXIBLE MISC
900.0000 mg | Freq: Once | Status: AC
  Administered 2022-02-06: 900 mg via INTRAVENOUS
  Filled 2022-02-06: qty 90

## 2022-02-06 MED ORDER — PANTOPRAZOLE SODIUM 40 MG IV SOLR
40.0000 mg | Freq: Every day | INTRAVENOUS | Status: DC
  Administered 2022-02-07 (×2): 40 mg via INTRAVENOUS
  Filled 2022-02-06 (×2): qty 10

## 2022-02-06 MED ORDER — FENTANYL CITRATE PF 50 MCG/ML IJ SOSY
25.0000 ug | PREFILLED_SYRINGE | INTRAMUSCULAR | Status: DC | PRN
  Administered 2022-02-06 – 2022-02-08 (×8): 25 ug via INTRAVENOUS
  Filled 2022-02-06 (×8): qty 1

## 2022-02-06 MED ORDER — POLYETHYLENE GLYCOL 3350 17 G PO PACK: 17.0000 g | PACK | Freq: Every day | ORAL | Status: DC | PRN

## 2022-02-06 MED ORDER — SODIUM CHLORIDE 0.9 % IV SOLN: INTRAVENOUS | Status: DC

## 2022-02-06 NOTE — ED Provider Triage Note (Signed)
Emergency Medicine Provider Triage Evaluation Note  [name] , a 81 y.o. male  was evaluated in triage.  Pt complains of rash, nosebleed, headache.  Evaluated at ENTs office yesterday and had treatment for his nosebleed, however woke up this morning with his sheets soaked in blood.  He also endorses a severe headache along the frontal aspect, has not had a headache he reports in about 11 years and this feels severe.  He is taken Tylenol and naproxen without much improvement in his headache.  He also reports a rash across his entire chest, breast, left arm where his prior COVID booster was received approximately a week ago.  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  Some concern for his platelets being low per wife.. Patient is on eliquis due to Afib.   Review of Systems  Positive: Headache, rash, Epistasis Negative: Dizziness, chest pain, sob  Physical Exam  BP (!) 149/95 (BP Location: Right Arm)   Pulse 85   Temp 97.9 F (36.6 C) (Oral)   Resp 16   SpO2 95%  Gen:   Awake, no distress   Resp:  Normal effort  MSK:   Moves extremities without difficulty  Other:  Nostrils with residual dried blood bilaterally.  Petechial nonblanchable rash extending around his entire chest, bilateral breast noted with significant bruising.  His upper and lower extremities without deficits.  Medical Decision Making  Medically screening exam initiated at 10:17 AM.  Appropriate orders placed.  [name]. was informed that the remainder of the evaluation will be completed by another provider, this initial triage assessment does not replace that evaluation, and the importance of remaining in the ED until their evaluation is complete.     Janeece Fitting, PA-C 02/06/22 1020

## 2022-02-06 NOTE — H&P (Signed)
NAME:  Frank Weimer., MRN:  [id number], DOB:  16-Sep-1940, LOS: 0 ADMISSION DATE:  02/06/2022, CONSULTATION DATE: 02/06/2022 REFERRING MD:  Dr Roderic Palau, CHIEF COMPLAINT: Subdural hematoma  History of Present Illness:  Patient came in with nonspecific complaints of feeling generally unwell Recently had his COVID booster shot about a week ago About a day or 2 afterwards noted some bruising on his arms, started having headache a day after his COVID shot He is on Eliquis May have used 3 doses of naproxen for the headaches Was not on aspirin  Did complain of some nausea yesterday and did have epistaxis the day before presentation  Background history of renal cell cancer and atrial fibrillation  Headache is dull, occipital.  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   GERD (gastroesophageal reflux disease) 11/10/2010   Hearing loss    Hiatal hernia    Hyperlipidemia with target LDL less than 70 11/28/2012   The 10-year ASCVD risk score Mikey Bussing DC Jr., et al., 2013) is: 30.7%   Values used to calculate the score:     Age: 50 years     Sex: Male     Is Non-Hispanic African American: No     Diabetic: No     Tobacco smoker: No     Systolic Blood Pressure: 169 mmHg     Is BP treated: No     HDL Cholesterol: 30.8 mg/dL     Total Cholesterol: 183 mg/dL    Hypoglycemia    Iron deficiency anemia due to dietary causes 12/07/2018   Lung nodule seen on imaging study 06/23/2018   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   Estimated Creatinine Clearance: 51.7 mL/min (by C-G formula based on SCr of 1.15 mg/dL).   Syncope    presented with atrial fib converted with Diltiazem   Systemic inflammatory response syndrome 06/26/2018   Thrombocytopenia 01/19/2021     Significant Hospital Events: Including procedures, antibiotic start and stop dates in addition to other pertinent events   CT scan of the head showing bilateral subdural hematomas about 7 mm in thickness, no evidence of shift or other evidence of mass effect  Interim History / Subjective:  Headache Nosebleed  Objective   Blood pressure (!) 141/71, pulse 76, temperature 97.6 F (36.4  C), temperature source Oral, resp. rate (!) 25, SpO2 97 %.       No intake or output data in the 24 hours ending 02/06/22 1324 There were no vitals filed for this visit.  Examination: General: Chronically ill-appearing HENT: External mucosa Lungs: Clear but decreased air movement Cardiovascular: S1-S2 appreciated Abdomen: Soft, bowel sounds appreciated Extremities: No clubbing, no edema Neuro: Alert and oriented to person and place, pupils about 4 mm reactive GU:    Resolved Hospital Problem list     Assessment & Plan:  Bilateral subdural hematoma -Appears acute on CT scan -Was on Eliquis, took naproxen for headache  Andexxa given  Thrombocytopenia -2 units platelets ordered -Thrombocytopenia likely linked to his COVID-vaccine  Consult neurosurgery -Pupils equal and reacting -Size of the bleed less than 10 mm -No mass effect on CT -Does not appear to have any neurological focality at present  Neurochecks -Neurochecks hourly -May need surgical intervention if any changes in neuro status noted  Hold Eliquis  Epistaxis -Was seen by ENT yesterday 12/8 -Was not a candidate for cauterization  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, diagnosed in 2023 -S/p left radical nephrectomy-October 18, 2018 -Was on ipilimumab and nivolumab-on hold as per documentation on 12/03/2021 by oncology  Obstructive lung disease Emphysema Pulmonary fibrosis -Previous CT scan findings of multiple nodules will need further follow-up  Best Practice (right click and "Reselect all SmartList Selections" daily)   Diet/type: NPO w/ oral meds DVT prophylaxis: SCD GI prophylaxis: PPI Lines: N/A Foley:  N/A Code Status:  full code Last date of multidisciplinary goals of care discussion [pending]  Labs   CBC: Recent Labs  Lab 02/06/22 1018  WBC 7.0  NEUTROABS 5.6  HGB 12.0*  HCT 34.9*  MCV 95.4  PLT <5*    Basic Metabolic Panel: No results for input(s): "NA", "K", "CL", "CO2", "GLUCOSE", "BUN", "CREATININE", "CALCIUM", "MG", "PHOS" in the last 168 hours. GFR: CrCl cannot be calculated (Patient's most recent lab result is older than the maximum 21 days allowed.). Recent Labs  Lab 02/06/22 1018  WBC 7.0    Liver Function Tests: No results for input(s): "AST", "ALT", "ALKPHOS", "BILITOT", "PROT", "ALBUMIN" in the last 168 hours. No results for input(s): "LIPASE", "AMYLASE" in the last 168 hours. No results for input(s): "AMMONIA"  in the last 168 hours.  ABG    Component Value Date/Time   PHART 7.261 (L) 10/18/2018 1258   PCO2ART 45.0 10/18/2018 1258   PO2ART 123.0 (H) 10/18/2018 1258   HCO3 20.2 10/18/2018 1258   TCO2 22 10/18/2018 1258   ACIDBASEDEF 7.0 (H) 10/18/2018 1258   O2SAT 98.0 10/18/2018 1258     Coagulation Profile: No results for input(s): "INR", "PROTIME" in the last 168 hours.  Cardiac Enzymes: No results for input(s): "CKTOTAL", "CKMB", "CKMBINDEX", "TROPONINI" in the last 168 hours.  HbA1C: Hgb A1c MFr Bld  Date/Time Value Ref Range Status  01/19/2021 02:23 PM 5.9 4.6 - 6.5 % Final    Comment:    Glycemic Control Guidelines for People with Diabetes:Non Diabetic:  <6%Goal of Therapy: <7%Additional Action Suggested:  >8%   01/17/2020 02:03 PM 5.7 4.6 - 6.5 % Final    Comment:    Glycemic Control Guidelines for People with Diabetes:Non Diabetic:  <6%Goal of Therapy: <7%Additional Action Suggested:  >8%     CBG: No results for input(s): "GLUCAP" in the last 168 hours.  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 sciatica (07/18/2019), Chronic sinusitis (05/18/2021), Diverticulosis, Erectile dysfunction due to arterial insufficiency (04/23/2015), Esophageal stricture, Essential hypertension (05/01/2018), GERD (gastroesophageal reflux disease) (11/10/2010), Hearing loss, Hiatal hernia, Hyperlipidemia with target LDL less than 70 (11/28/2012), Hypoglycemia, Iron deficiency anemia due to dietary causes (12/07/2018), Lung nodule seen on imaging study (06/23/2018), 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   Surgical History:   Past Surgical History:  Procedure Laterality Date   CYSTOSCOPY WITH URETEROSCOPY AND STENT PLACEMENT Left 08/16/2018   Procedure: CYSTOSCOPY WITH LEFT RETROGRADE PYELOGRAM, BALLOON DILATION LEFT URETER, STENT PLACEMENT;  Surgeon: Lucas Mallow, MD;  Location: [facility];  Service: Urology;  Laterality: Left;   HERNIA REPAIR     INSERT / REPLACE / REMOVE PACEMAKER     LAPAROSCOPIC ASSISTED VENTRAL HERNIA REPAIR N/A 10/18/2018   Procedure: LAPAROSCOPIC ASSISTED VENTRAL WALL HERNIA REPAIR WITH MESH;  Surgeon: Michael Boston, MD;  Location: [facility];  Service: General;  Laterality: N/A;   LAPAROSCOPIC NEPHRECTOMY, HAND ASSISTED Left 10/18/2018   Procedure: LEFT HAND ASSISTED LAPAROSCOPIC RADICAL NEPHRECTOMY WITH ADRENALECTOMY;  Surgeon: Lucas Mallow, MD;  Location: [facility];  Service: Urology;  Laterality: Left;   LOOP RECORDER INSERTION N/A 04/26/2018   Procedure: LOOP RECORDER INSERTION;  Surgeon: Thompson Grayer, MD;  Location: [facility];  Service: Cardiovascular;  Laterality: N/A;   LOOP RECORDER REMOVAL N/A 07/18/2018   Procedure: LOOP RECORDER REMOVAL;  Surgeon: Thompson Grayer, MD;  Location: [facility];  Service: Cardiovascular;  Laterality: N/A;   NASAL SEPTUM SURGERY     PACEMAKER IMPLANT N/A 07/18/2018   Procedure: PACEMAKER IMPLANT;  Surgeon: Thompson Grayer, MD;  Location: [facility];  Service: Cardiovascular;  Laterality: N/A;   TONSILLECTOMY     TONSILLECTOMY     UMBILICAL HERNIA REPAIR  1993   WISDOM TOOTH EXTRACTION      about 81 years old     Social History:   reports that he quit smoking about 34 years ago. His smoking use included cigarettes. He has never used smokeless tobacco. He reports that he does not drink alcohol and does not use drugs.   Family History:  His family history includes Bipolar disorder in his paternal grandmother and sister; Dementia in his paternal grandmother; Emphysema in his father; Gallbladder disease in his sister; Memory loss in his paternal grandmother and sister. There is no history of Colon cancer.   Allergies Allergies  Allergen Reactions   Hydrocodone Other (See Comments)    Caused confusion    Monosodium Glutamate Other (See Comments)    Caused headaches   Naproxen Sodium     Mouth blisters with prolonged use.  Left nephrectomy 2020   Other     Perfume - headaches      Home Medications  Prior to Admission medications   Medication Sig Start Date End Date Taking? Authorizing Provider  acetaminophen (TYLENOL) 500 MG tablet Take 1,000 mg by mouth every 6 (six) hours as needed for mild pain.   Yes  [provider]  apixaban (ELIQUIS) 5 MG TABS tablet Take 1 tablet by mouth twice daily Patient taking differently: Take 5 mg by mouth 2 (two) times daily. 10/13/21  Yes Nahser, Wonda Cheng, MD  Calcium Carbonate (CALCIUM 500 PO) Take 500 mg by mouth 2 (two) times daily.   Yes [provider]  denosumab (PROLIA) 60 MG/ML SOSY injection Inject 60 mg into the skin every 6 (six) months.   Yes [provider]  esomeprazole (NEXIUM) 40 MG capsule Take 1 capsule (40 mg total) by mouth daily before breakfast. 12/29/21  Yes Irene Shipper, MD  Multiple Vitamins-Minerals (CENTRUM MULTI + OMEGA 3 PO) Take 1 tablet by mouth daily.    Yes [provider]  naproxen sodium (ALEVE) 220 MG tablet Take 220 mg by mouth daily as needed (pain).   Yes [provider]  omega-3 acid ethyl esters (LOVAZA) 1 g capsule Take 2 capsules by mouth twice daily 11/02/21   Yes [name], MD  prochlorperazine (COMPAZINE) 10 MG tablet Take 1 tablet (10 mg total) by mouth every 6 (six) hours as needed for nausea or vomiting. 09/03/21  Yes Wyatt Portela, MD  rosuvastatin (CRESTOR) 10 MG tablet Take 1 tablet by mouth once daily 10/21/21  Yes [name], MD  solifenacin (VESICARE) 10 MG tablet Take 1 tablet by mouth once daily Patient taking differently: Take 10 mg by mouth at bedtime. 01/11/22  Yes [name], MD  VITAMIN D PO Take 1,000 Units by mouth daily.   Yes [provider]    The patient is critically ill with multiple organ systems failure and requires high complexity decision making for assessment and support, frequent evaluation and titration of therapies, application of advanced monitoring technologies and extensive interpretation of multiple databases. Critical Care Time devoted to patient care services described in this note independent of APP/resident time (if applicable)  is 38 minutes.   Sherrilyn Rist MD [facility] Personal pager: See Amion If unanswered, please page CCM On-call: [phone]

## 2022-02-06 NOTE — ED Notes (Signed)
Patient is very anxious at this time.

## 2022-02-06 NOTE — H&P (Deleted)
.  pcc

## 2022-02-06 NOTE — Progress Notes (Signed)
PCCM brief note  [name] Transferred from [facility] to [facility].  Patient GCS 15, RASS 0, MAE, PERRL 76mm, room air. Complaints of headache and nausea.  Paged neurosurgery. Spoke with Dr. Zada Finders- to see patient.  Repeat CBC post two units of plt. Goal plt greater than 25 per Dr. Zada Finders.  Continue plan per Dr. Charlestine Night., MSN, APRN, AGACNP-BC [facility]  02/06/2022 , 10:31 PM  Please see [email or web address] for pager details  If no response, please call [phone] After hours, please call Elink at [phone]

## 2022-02-06 NOTE — Consult Note (Signed)
Neurosurgery Consultation  Reason for Consult: Subdural hematoma Referring Physician: Roderic Palau  CC: Headache  HPI: This is a 81 y.o. man that presents with epistaxis, complained of severe HA in the ED prompting [place] which showed SDH.  Pt w/ h/o thrombocytopenia but hovers around 100, also on rivaroxaban for PAF with prior pacer for SSS, RCC with stage 3CKD. Workup before transfer showed PLT<5, got andexxa for rivaroxaban reversal, has been getting platelets overnight. Today he has a severe headache, describes it as moderate but he looks pretty miserable, some nausea last night but none today, no diplopia.   ROS: A 14 point ROS was performed and is negative except as noted in the HPI.   PMHx:  Past Medical History:  Diagnosis Date   Abdominal aortic aneurysm (AAA) without rupture 06/23/2018   Age-related osteoporosis without current pathological fracture 07/14/2021   BPH (benign prostatic hyperplasia) 01/19/2010   Cervical radiculopathy 06/16/2018   Chronic anticoagulation 08/22/2018   Chronic bilateral low back pain without sciatica 07/18/2019   Chronic sinusitis 05/18/2021   Diverticulosis    Erectile dysfunction due to arterial insufficiency 04/23/2015   Esophageal stricture    Essential hypertension 05/01/2018   Estimated Creatinine Clearance: 44.1 mL/min (by C-G formula based on SCr of 1.35 mg/dL).   GERD (gastroesophageal reflux disease) 11/10/2010   Hearing loss    Hiatal hernia    Hyperlipidemia with target LDL less than 70 11/28/2012   The 10-year ASCVD risk score Mikey Bussing DC Jr., et al., 2013) is: 30.7%   Values used to calculate the score:     Age: 81 years     Sex: Male     Is Non-Hispanic African American: No     Diabetic: No     Tobacco smoker: No     Systolic Blood Pressure: 229 mmHg     Is BP treated: No     HDL Cholesterol: 30.8 mg/dL     Total Cholesterol: 183 mg/dL   Hypoglycemia    Iron deficiency anemia due to dietary causes 12/07/2018   Lung nodule seen on imaging  study 06/23/2018   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   Estimated Creatinine Clearance: 51.7 mL/min (by C-G formula based on SCr of 1.15 mg/dL).   Syncope    presented with atrial fib converted with Diltiazem   Systemic inflammatory response syndrome 06/26/2018   Thrombocytopenia 01/19/2021   FamHx:  Family History  Problem Relation Age of Onset   Emphysema Father    Memory loss Sister    Gallbladder disease Sister    Bipolar disorder Sister    Bipolar disorder Paternal Grandmother    Dementia Paternal Grandmother    Memory loss Paternal Grandmother    Colon cancer Neg Hx    SocHx:  reports that he quit smoking about 34 years ago. His smoking use included cigarettes. He has never used smokeless tobacco.  He reports that he does not drink alcohol and does not use drugs.  Exam: Vital signs in last 24 hours: Temp:  [97.4 F (36.3 C)-98.9 F (37.2 C)] 98.9 F (37.2 C) (12/10 0930) Pulse Rate:  [59-87] 65 (12/10 1000) Resp:  [11-29] 24 (12/10 1000) BP: (109-143)/(46-83) 126/66 (12/10 1000) SpO2:  [85 %-98 %] 94 % (12/10 1000) Weight:  [75.9 kg-76 kg] 75.9 kg (12/10 0500) General: Awake, alert, cooperative, lying in bed, appears uncomfortable Head: Normocephalic  HEENT: Neck supple Pulmonary: breathing room air comfortably, no evidence of increased  work of breathing Cardiac: RRR Abdomen: S NT ND Extremities: Warm and well perfused x4, petechial pattern and some senile purpura  Neuro: AOx3, PERRL, EOMI, FS Strength 5/5 x4, SILTx4   Assessment and Plan: 81 y.o. man with severe thrombocytopenia, on rivaroxaban s/p andexxa, w/ severe headache. [place] personally reviewed, which shows bilateral mixed density SDH, presumed acute on chronic but with platelets of 50 that could also be hyperacute and acute blood. PLT from this morning <5 and INR 1.4.  -discussed w/ the pt and his wife at bedside, unfortunately there's nothing I can do in the setting of such severe thrombocytopenia -will repeat the [facility] today to get a better idea of how much of the hemorrhage is acute / subacute / hyperacute to help determine prognosis, given that we don't have a hematologic diagnosis, I did not give a firm prognosis but did tell them that DIC/TTP/etc spectrum with intracranial hemorrhage is a very bad situation  Judith Part, MD 02/07/22 10:50 AM [facility]

## 2022-02-06 NOTE — ED Triage Notes (Signed)
Pt presents with c/o multiple issues. Pt reports he had a covid booster vaccine at the beginning of the week and since then has been experiencing a full body rash, a headache, and some nosebleeds.

## 2022-02-06 NOTE — ED Notes (Signed)
Patient husband took patient 2 bags of personal belongings home

## 2022-02-06 NOTE — Progress Notes (Addendum)
[facility] Progress Note Patient Name: Inioluwa Baris. DOB: 1940-03-23 MRN: [id number]   Date of Service  02/06/2022  HPI/Events of Note  81/M on anticoagulation (for SSS?), who presents to the ED feeling generally unwell. Subsequent workup revealed bilateral SDH, thrombocytopenia. He was given Andexxa to reverse elequis, as well as 2 units platelets. He was then transferred to this facility for close monitoring.   eICU Interventions  Bilateral SDH - Admit to ICU - Serial neurochecks per protocol. Will plan for stat CT head if with acute clinical deterioration - Hold anticoagulation, correct thrombocytopenia - Maintain SBP <165mmHg - Will follow further neurosurgery recommendations.   2. DVT ppx - SCDs        Lenin Kuhnle M DELA CRUZ 02/06/2022, 9:42 PM  12:02 AM Received report of post transfusion platelet count of  5,000. Placed order to transfuse 2 more units of platelets.  Target platelets of >25

## 2022-02-06 NOTE — ED Notes (Signed)
[facility] is her to take patient at this time.

## 2022-02-06 NOTE — ED Notes (Signed)
Spoke with [facility] they state that he will be first on the list once staff has changed shift.

## 2022-02-06 NOTE — ED Notes (Signed)
RN called [facility] floor and gave report to TK, RN  [facility] called

## 2022-02-06 NOTE — ED Provider Notes (Signed)
[place] DEPT Provider Note   CSN: [id number] Arrival date & time: 02/06/22  3810     History {Add pertinent medical, surgical, social history, OB history to HPI:1} Chief Complaint  Patient presents with   Epistaxis   Headache   Rash    [name] is a 81 y.o. male.  Patient with a history of renal cell cancer and atrial fibs.  He is taking Eliquis for the A-fib.  He had a nosebleed yesterday and was seen by ENT.  Today he is complaining of a headache which has been going on for a week.  According to his wife he also is little slow to respond  The history is provided by the patient and medical records.  Headache Pain location:  Occipital Quality:  Dull Radiates to:  Does not radiate Severity currently:  4/10 Severity at highest:  7/10 Onset quality:  Sudden Duration:  1 week Timing:  Constant Progression:  Worsening Chronicity:  New Similar to prior headaches: no   Context: not activity   Relieved by:  Nothing Worsened by:  Nothing Associated symptoms: no abdominal pain, no back pain, no congestion, no cough, no diarrhea, no fatigue, no seizures and no sinus pressure        Home Medications Prior to Admission medications   Medication Sig Start Date End Date Taking? Authorizing Provider  acetaminophen (TYLENOL) 500 MG tablet Take 1,000 mg by mouth every 6 (six) hours as needed for mild pain.   Yes [provider]  apixaban (ELIQUIS) 5 MG TABS tablet Take 1 tablet by mouth twice daily Patient taking differently: Take 5 mg by mouth 2 (two) times daily. 10/13/21  Yes Nahser, Wonda Cheng, MD  Calcium Carbonate (CALCIUM 500 PO) Take 500 mg by mouth 2 (two) times daily.   Yes [provider]  denosumab (PROLIA) 60 MG/ML SOSY injection Inject 60 mg into the skin every 6 (six) months.   Yes [provider]  esomeprazole (NEXIUM) 40 MG capsule Take 1 capsule (40 mg total) by mouth daily before breakfast. 12/29/21   Yes Irene Shipper, MD  Multiple Vitamins-Minerals (CENTRUM MULTI + OMEGA 3 PO) Take 1 tablet by mouth daily.    Yes [provider]  naproxen sodium (ALEVE) 220 MG tablet Take 220 mg by mouth daily as needed (pain).   Yes [provider]  omega-3 acid ethyl esters (LOVAZA) 1 g capsule Take 2 capsules by mouth twice daily 11/02/21  Yes [name], MD  prochlorperazine (COMPAZINE) 10 MG tablet Take 1 tablet (10 mg total) by mouth every 6 (six) hours as needed for nausea or vomiting. 09/03/21  Yes Wyatt Portela, MD  rosuvastatin (CRESTOR) 10 MG tablet Take 1 tablet by mouth once daily 10/21/21  Yes [name], MD  solifenacin (VESICARE) 10 MG tablet Take 1 tablet by mouth once daily Patient taking differently: Take 10 mg by mouth at bedtime. 01/11/22  Yes [name], MD  VITAMIN D PO Take 1,000 Units by mouth daily.   Yes [provider]      Allergies    Hydrocodone, Monosodium glutamate, Naproxen sodium, and Other    Review of Systems   Review of Systems  Constitutional:  Negative for appetite change and fatigue.  HENT:  Negative for congestion, ear discharge and sinus pressure.   Eyes:  Negative for discharge.  Respiratory:  Negative for cough.   Cardiovascular:  Negative for chest pain.  Gastrointestinal:  Negative for abdominal pain and diarrhea.  Genitourinary:  Negative for frequency and hematuria.  Musculoskeletal:  Negative for back pain.  Skin:  Negative for rash.  Neurological:  Positive for headaches. Negative for seizures.  Psychiatric/Behavioral:  Negative for hallucinations.     Physical Exam Updated Vital Signs BP 135/69 (BP Location: Left Arm)   Pulse 65   Temp 97.6 F (36.4 C)   Resp (!) 22   SpO2 95%  Physical Exam Vitals reviewed.  Constitutional:      Appearance: He is well-developed.  HENT:     Head: Normocephalic.     Nose: Nose normal.  Eyes:     General: No scleral icterus.    Conjunctiva/sclera: Conjunctivae  normal.  Neck:     Thyroid: No thyromegaly.  Cardiovascular:     Rate and Rhythm: Normal rate and regular rhythm.     Heart sounds: No murmur heard.    No friction rub. No gallop.  Pulmonary:     Breath sounds: No stridor. No wheezing or rales.  Chest:     Chest wall: No tenderness.  Abdominal:     General: There is no distension.     Tenderness: There is no abdominal tenderness. There is no rebound.  Musculoskeletal:        General: Normal range of motion.     Cervical back: Neck supple.  Lymphadenopathy:     Cervical: No cervical adenopathy.  Skin:    Findings: Rash present. No erythema.     Comments: Patient with a few bruising to chest and left arm but some petechiae  Neurological:     Mental Status: He is alert and oriented to person, place, and time.     Motor: No abnormal muscle tone.     Coordination: Coordination normal.  Psychiatric:        Behavior: Behavior normal.     ED Results / Procedures / Treatments   Labs (all labs ordered are listed, but only abnormal results are displayed) Labs Reviewed  COMPREHENSIVE METABOLIC PANEL - Abnormal; Notable for the following components:      Result Value   Sodium 130 (*)    Glucose, Bld 173 (*)    All other components within normal limits  URINALYSIS, ROUTINE W REFLEX MICROSCOPIC - Abnormal; Notable for the following components:   Glucose, UA 50 (*)    All other components within normal limits  PROTIME-INR - Abnormal; Notable for the following components:   Prothrombin Time 16.7 (*)    INR 1.4 (*)    All other components within normal limits  CBC WITH DIFFERENTIAL/PLATELET - Abnormal; Notable for the following components:   RBC 3.66 (*)    Hemoglobin 12.0 (*)    HCT 34.9 (*)    Platelets <5 (*)    Abs Immature Granulocytes 0.12 (*)    All other components within normal limits  CBC WITH DIFFERENTIAL/PLATELET  TYPE AND SCREEN  PREPARE PLATELET PHERESIS    EKG None  Radiology DG Chest Port 1 View  Result  Date: 02/06/2022 CLINICAL DATA:  Altered mental status EXAM: PORTABLE CHEST 1 VIEW COMPARISON:  CXR 07/19/18, CT Chest 11/24/21 FINDINGS: Left-sided dual lead cardiac device with unchanged lead positioning. No pleural effusion. No pneumothorax. Unchanged cardiac and mediastinal contours compared to prior exam there is a new hazy pulmonary opacity in the right lung base. No displaced rib fractures. Visualized upper abdomen is unremarkable. Aortic atherosclerotic calcifications. IMPRESSION: New hazy pulmonary opacity in the right lung base, which  may represent atelectasis or infection in the appropriate clinical setting. Electronically Signed   By: Marin Roberts M.D.   On: 02/06/2022 11:05   CT HEAD WO CONTRAST (5MM)  Result Date: 02/06/2022 CLINICAL DATA:  Headache. EXAM: CT HEAD WITHOUT CONTRAST TECHNIQUE: Contiguous axial images were obtained from the base of the skull through the vertex without intravenous contrast. RADIATION DOSE REDUCTION: This exam was performed according to the departmental dose-optimization program which includes automated exposure control, adjustment of the mA and/or kV according to patient size and/or use of iterative reconstruction technique. COMPARISON:  04/24/2018. FINDINGS: Brain: Positive for intracranial hemorrhage. Hyperattenuating subdural hemorrhage extends along the interhemispheric fissure, more prominently on the right, maximum thickness 7 mm. Small bilateral subdural hemorrhages, right greater than left, extend around both cerebral hemispheres, mixed attenuation, with areas of hyperattenuation, intermediate attenuation and low attenuation. No evidence of parenchymal or subarachnoid hemorrhage. No intraventricular hemorrhage. No midline shift. No evidence of an ischemic infarct. No parenchymal or extra-axial masses. Vascular: No hyperdense vessel or unexpected calcification. Skull: Normal. Negative for fracture or focal lesion. Sinuses/Orbits: Globes and orbits are  unremarkable. Sinuses are clear. Other: None. IMPRESSION: 1. Bilateral subdural hemorrhage as detailed, a significant portion of which appears acute/recent. No significant mass effect or midline shift. Critical Value/emergent results were called by telephone at the time of interpretation on 02/06/2022 at 10:44 am to provider [facility] , who verbally acknowledged these results. Electronically Signed   By: Lajean Manes M.D.   On: 02/06/2022 10:45    Procedures Procedures  {Document cardiac monitor, telemetry assessment procedure when appropriate:1}  Medications Ordered in ED Medications  0.9 %  sodium chloride infusion (Manually program via Guardrails IV Fluids) (has no administration in time range)  coag fact Xa recombinant (ANDEXXA) low dose infusion 900 mg (900 mg Intravenous New Bag/Given 02/06/22 1115)    ED Course/ Medical Decision Making/ A&P  CRITICAL CARE Performed by: Milton Ferguson Total critical care time: 55 minutes Critical care time was exclusive of separately billable procedures and treating other patients. Critical care was necessary to treat or prevent imminent or life-threatening deterioration. Critical care was time spent personally by me on the following activities: development of treatment plan with patient and/or surrogate as well as nursing, discussions with consultants, evaluation of patient's response to treatment, examination of patient, obtaining history from patient or surrogate, ordering and performing treatments and interventions, ordering and review of laboratory studies, ordering and review of radiographic studies, pulse oximetry and re-evaluation of patient's condition.     Patient with bilateral subdurals and thrombocytopenia.  He is given platelets and Andexxa and will be admitted to ICU with neurosurgery consulting                         Medical Decision Making Risk Prescription drug management. Decision regarding hospitalization.   Thrombocytopenia  and bilateral subdurals  {Document critical care time when appropriate:1} {Document review of labs and clinical decision tools ie heart score, Chads2Vasc2 etc:1}  {Document your independent review of radiology images, and any outside records:1} {Document your discussion with family members, caretakers, and with consultants:1} {Document social determinants of health affecting pt's care:1} {Document your decision making why or why not admission, treatments were needed:1} Final Clinical Impression(s) / ED Diagnoses Final diagnoses:  None    Rx / DC Orders ED Discharge Orders     None

## 2022-02-07 ENCOUNTER — Inpatient Hospital Stay (HOSPITAL_COMMUNITY): Payer: Medicare Other

## 2022-02-07 DIAGNOSIS — D696 Thrombocytopenia, unspecified: Secondary | ICD-10-CM | POA: Diagnosis not present

## 2022-02-07 DIAGNOSIS — C342 Malignant neoplasm of middle lobe, bronchus or lung: Secondary | ICD-10-CM

## 2022-02-07 DIAGNOSIS — C7970 Secondary malignant neoplasm of unspecified adrenal gland: Secondary | ICD-10-CM

## 2022-02-07 DIAGNOSIS — C7800 Secondary malignant neoplasm of unspecified lung: Secondary | ICD-10-CM

## 2022-02-07 DIAGNOSIS — I6200 Nontraumatic subdural hemorrhage, unspecified: Secondary | ICD-10-CM

## 2022-02-07 DIAGNOSIS — C7889 Secondary malignant neoplasm of other digestive organs: Secondary | ICD-10-CM

## 2022-02-07 DIAGNOSIS — S065XAA Traumatic subdural hemorrhage with loss of consciousness status unknown, initial encounter: Secondary | ICD-10-CM | POA: Diagnosis not present

## 2022-02-07 LAB — CBC
HCT: 31.4 % — ABNORMAL LOW (ref 39.0–52.0)
Hemoglobin: 11.1 g/dL — ABNORMAL LOW (ref 13.0–17.0)
MCH: 33.3 pg (ref 26.0–34.0)
MCHC: 35.4 g/dL (ref 30.0–36.0)
MCV: 94.3 fL (ref 80.0–100.0)
Platelets: <5 10*3/uL — CL (ref 150–400)
RBC: 3.33 MIL/uL — ABNORMAL LOW (ref 4.22–5.81)
RDW: 13.4 % (ref 11.5–15.5)
WBC: 6.7 10*3/uL (ref 4.0–10.5)
nRBC: 0 % (ref 0.0–0.2)

## 2022-02-07 LAB — BASIC METABOLIC PANEL
Anion gap: 11 (ref 5–15)
BUN: 13 mg/dL (ref 8–23)
CO2: 20 mmol/L — ABNORMAL LOW (ref 22–32)
Calcium: 8.1 mg/dL — ABNORMAL LOW (ref 8.9–10.3)
Chloride: 99 mmol/L (ref 98–111)
Creatinine, Ser: 0.74 mg/dL (ref 0.61–1.24)
GFR, Estimated: >60 mL/min (ref >60)
Glucose, Bld: 148 mg/dL — ABNORMAL HIGH (ref 70–99)
Potassium: 3.6 mmol/L (ref 3.5–5.1)
Sodium: 130 mmol/L — ABNORMAL LOW (ref 135–145)

## 2022-02-07 LAB — IMMATURE PLATELET FRACTION: Immature Platelet Fraction: 14.5 % — ABNORMAL HIGH (ref 1.2–8.6)

## 2022-02-07 LAB — DIC (DISSEMINATED INTRAVASCULAR COAGULATION)PANEL
D-Dimer, Quant: 3.07 ug/mL-FEU — ABNORMAL HIGH (ref 0.00–0.50)
Fibrinogen: 448 mg/dL (ref 210–475)
INR: 1.4 — ABNORMAL HIGH (ref 0.8–1.2)
Platelets: <5 10*3/uL — CL (ref 150–400)
Prothrombin Time: 16.9 seconds — ABNORMAL HIGH (ref 11.4–15.2)
Smear Review: NONE SEEN
aPTT: 27 seconds (ref 24–36)

## 2022-02-07 LAB — GLUCOSE, CAPILLARY
Glucose-Capillary: 128 mg/dL — ABNORMAL HIGH (ref 70–99)
Glucose-Capillary: 236 mg/dL — ABNORMAL HIGH (ref 70–99)

## 2022-02-07 LAB — PLATELET COUNT: Platelets: 5 10*3/uL — CL (ref 150–400)

## 2022-02-07 MED ORDER — SODIUM CHLORIDE 0.9% IV SOLUTION: Freq: Once | INTRAVENOUS | Status: AC

## 2022-02-07 MED ORDER — ORAL CARE MOUTH RINSE: 15.0000 mL | OROMUCOSAL | Status: DC | PRN

## 2022-02-07 MED ORDER — LORATADINE 10 MG PO TABS
10.0000 mg | ORAL_TABLET | Freq: Every day | ORAL | Status: DC
  Administered 2022-02-07 – 2022-02-08 (×2): 10 mg via ORAL
  Filled 2022-02-07 (×2): qty 1

## 2022-02-07 MED ORDER — IMMUNE GLOBULIN (HUMAN) 10 GM/100ML IV SOLN
1.0000 g/kg | INTRAVENOUS | Status: AC
  Administered 2022-02-07 – 2022-02-08 (×2): 65 g via INTRAVENOUS
  Filled 2022-02-07 (×6): qty 650

## 2022-02-07 MED ORDER — SODIUM CHLORIDE 0.9% IV SOLUTION: Freq: Once | INTRAVENOUS | Status: DC

## 2022-02-07 MED ORDER — INSULIN ASPART 100 UNIT/ML IJ SOLN
0.0000 [IU] | Freq: Three times a day (TID) | INTRAMUSCULAR | Status: DC
  Administered 2022-02-08: 2 [IU] via SUBCUTANEOUS
  Administered 2022-02-08: 1 [IU] via SUBCUTANEOUS
  Administered 2022-02-09: 2 [IU] via SUBCUTANEOUS

## 2022-02-07 MED ORDER — DEXAMETHASONE SODIUM PHOSPHATE 10 MG/ML IJ SOLN
40.0000 mg | Freq: Every day | INTRAMUSCULAR | Status: AC
  Administered 2022-02-07 – 2022-02-10 (×4): 40 mg via INTRAVENOUS
  Filled 2022-02-07 (×4): qty 4

## 2022-02-07 NOTE — Progress Notes (Signed)
Patient tolerated IVIG therapy. States he is feeling some better this afternoon.

## 2022-02-07 NOTE — Progress Notes (Signed)
NAME:  [name], MRN:  [id number], DOB:  1940/08/29, LOS: 1 ADMISSION DATE:  02/06/2022, CONSULTATION DATE: 02/06/2022 REFERRING MD:  Dr Roderic Palau, CHIEF COMPLAINT: Subdural hematoma  History of Present Illness:  Patient came in with nonspecific complaints of feeling generally unwell Recently had his COVID booster shot about a week ago About a day or 2 afterwards noted some bruising on his arms, started having headache a day after his COVID shot He is on Eliquis May have used 3 doses of naproxen for the headaches Was not on aspirin  Did complain of some nausea yesterday and did have epistaxis the day before presentation  Background history of renal cell cancer and atrial fibrillation  Headache is dull, occipital.  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   GERD (gastroesophageal reflux disease) 11/10/2010   Hearing loss    Hiatal hernia    Hyperlipidemia with target LDL less than 70 11/28/2012   The 10-year ASCVD risk score Mikey Bussing DC Jr., et al., 2013) is: 30.7%   Values used to calculate the score:     Age: 81 years     Sex: Male     Is Non-Hispanic African American: No     Diabetic: No     Tobacco smoker: No     Systolic Blood Pressure: 740 mmHg     Is BP treated: No     HDL Cholesterol: 30.8 mg/dL     Total Cholesterol: 183 mg/dL    Hypoglycemia    Iron deficiency anemia due to dietary causes 12/07/2018   Lung nodule seen on imaging study 06/23/2018   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   Estimated Creatinine Clearance: 51.7 mL/min (by C-G formula based on SCr of 1.15 mg/dL).   Syncope    presented with atrial fib converted with Diltiazem   Systemic inflammatory response syndrome 06/26/2018   Thrombocytopenia 01/19/2021     Significant Hospital Events: Including procedures, antibiotic start and stop dates in addition to other pertinent events   CT scan of the head showing bilateral subdural hematomas about 7 mm in thickness, no evidence of shift or other evidence of mass effect  Interim History / Subjective:  Received platelets overnight and this morning Seen by NSY and ordered CT head which demonstrated stable  acute bilateral subdural, no midline/mass effect  Objective   Blood pressure 138/65, pulse 63, temperature 98.9 F (37.2 C), temperature source Oral, resp. rate 17, height 5\' 5"  (1.651 m), weight 75.9 kg, SpO2 93 %.        Intake/Output Summary (Last 24 hours) at 02/07/2022 1158 Last data filed at 02/07/2022 1000 Gross per 24 hour  Intake 4343.79 ml  Output --  Net 4343.79 ml   Filed Weights   02/06/22 1938 02/07/22 0500  Weight: 76 kg 75.9 kg    Physical  Exam: General: Chronically ill and elderly-appearing, no acute distress HENT: [place], AT, OP clear, MMM Eyes: EOMI, no scleral icterus Respiratory: Diminished but clear to auscultation bilaterally.  No crackles, wheezing or rales Cardiovascular: RRR, -M/R/G, no JVD GI: BS+, soft, nontender Extremities:-Edema,-tenderness Neuro: AAO x4, CNII-XII grossly intact Psych: Normal mood, normal affect  Plat 5  Resolved Hospital Problem list     Assessment & Plan:   Bilateral subdural hematoma in setting of severe thrombocytopenia and recent Eliquis use On home Eliquis, took naproxen for headache -S/p Andexxa  -Holding antiplatelet and anticoagulation -Neuro checks -Reviewed CT head today. Stable no mass/midline effect -NSG following. Not a surgical candidate due to severe thrombocytopenia  Thrombocytopenia -Smear neg schistocytes however Ddx includes immunotherapy delayed ITP vs malignancy induced ITP. Low suspicion for sepsis -Total 5 units platelets ordered -Order additional 2U platelets -Hematology consulted. 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, diagnosed in 2023 -S/p left radical nephrectomy-October 18, 2018 -Was on ipilimumab and nivolumab-on hold as per documentation on 12/03/2021 by oncology  Epistaxis -Was seen by ENT yesterday 12/8 -Was not a candidate for cauterization  Obstructive lung disease Emphysema Pulmonary fibrosis -Previous CT scan findings of multiple nodules will need further follow-up  Paroxysmal atrial fibrillation/SSS s/p PM HTN, HLD -Tele -Hold AC  CKD stage IIIB -Monitor UOP/Cr   Best Practice (right click and "Reselect all SmartList Selections" daily)   Diet/type: NPO w/ oral meds DVT prophylaxis: SCD GI prophylaxis: PPI Lines: N/A Foley:  N/A Code Status:  full code Last date of multidisciplinary goals of care discussion [pending]  Updated patient and wife at  bedside  The patient is critically ill with multiple organ systems failure and requires high complexity decision making for assessment and support, frequent evaluation and titration of therapies, application of advanced monitoring technologies and extensive interpretation of multiple databases.  Independent Critical Care Time: 20 Minutes.   Rodman Pickle, M.D. [facility] Pulmonary/Critical Care Medicine 02/07/2022 11:58 AM   Please see Amion for pager number to reach on-call Pulmonary and Critical Care Team.

## 2022-02-07 NOTE — Consult Note (Signed)
Marland Kitchen   HEMATOLOGY/ONCOLOGY CONSULTATION NOTE  Date of Service: 02/07/2022  Patient Care Team: [name], MD as PCP - General Nahser, Wonda Cheng, MD as PCP - Cardiology (Cardiology) Thompson Grayer, MD as PCP - Electrophysiology (Cardiology) Irene Shipper, MD as Consulting Physician (Gastroenterology) Michael Boston, MD as Consulting Physician (General Surgery) Thompson Grayer, MD as Consulting Physician (Cardiology) Lucas Mallow, MD as Consulting Physician (Urology) Wyatt Portela, MD as Consulting Physician (Oncology) Szabat, Darnelle Maffucci, [facility] (Inactive) (Pharmacist)  CHIEF COMPLAINTS/PURPOSE OF CONSULTATION:  Severe thrombocytopenia in the context of subdural hematoma  HISTORY OF PRESENTING ILLNESS:   [name] is a wonderful 81 y.o. male who has been referred to [place] by Dr Morrison Old Rodman Pickle MD for evaluation and management of severe thrombocytopenia.  Patient was diagnosed with renal cell carcinoma and is status post left radical nephrectomy completed by Dr. Gloriann Loan in August 2020 which was noted to have clear-cell histology with sarcomatoid features.  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  Marland Kitchen  He was also noted to have pancreatic involvement.  He was treated with get treated with Clement Husbands 3/epi 1 immunotherapy for 2 cycles on 10/01/2021 and 10/22/2021 by his oncologist Dr. [name]. 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   Patient presented to the emergency room on 02/06/2022 with complaints of feeling generally unwell.  He had received his COVID-19 booster shot about a week ago and about 1 to 2 days after he noted bruising on his arms and started having a headache.  Patient had been on Eliquis for paroxysmal atrial fibrillation with prior pacemaker placement for sick sinus  syndrome.  He was taking naproxen and might have used 3-4 doses for his headaches.  He did have epistaxis the day prior to hospital presentation and also had some nausea and severe headaches. Patient had a CT of the head which showed bilateral mixed density subdural hematomas which were presumed to be acute on chronic. He was noted to have platelets of less than 5k.  He was given platelet transfusions without any improvement in his platelet counts and did receive Andexxa to reverse his rivaroxaban. He has been seen by neurosurgery Dr. Venetia Constable and no subdural hematoma evacuation was possible due to severe thrombocytopenia. I was consulted today for his severe thrombocytopenia and nonresponsiveness to platelets by Dr. Loanne Drilling.  Patient CBC on admission on 02/06/2022 showed a hemoglobin of 12, WBC count of 7k and platelets of less than 5k Patient's platelet counts 3 weeks ago were about 91k. DIC panel shows INR of 1.4, fibrinogen levels of 448 and platelets of less than 5k and no schistocytes to suggest DIC.  Patient currently notes some improvement in his headaches. Minimal hemorrhoidal bleeding. No other significant bruising or bleeding. Notes some bruising on his upper extremities about 1-2 weeks ago from "working o my deck". Did have some epistaxis at home but none currently.  Rpt CT head at 11:15 am today shows stable acute SDH without change in thickness.  MEDICAL HISTORY:  Past Medical History:  Diagnosis Date   Abdominal aortic aneurysm (AAA) without rupture 06/23/2018   Age-related osteoporosis without current pathological fracture 07/14/2021   BPH (benign prostatic hyperplasia) 01/19/2010   Cervical radiculopathy 06/16/2018   Chronic anticoagulation 08/22/2018   Chronic bilateral low back pain without sciatica 07/18/2019   Chronic  sinusitis 05/18/2021   Diverticulosis    Erectile dysfunction due to arterial insufficiency 04/23/2015   Esophageal stricture    Essential  hypertension 05/01/2018   Estimated Creatinine Clearance: 44.1 mL/min (by C-G formula based on SCr of 1.35 mg/dL).   GERD (gastroesophageal reflux disease) 11/10/2010   Hearing loss    Hiatal hernia    Hyperlipidemia with target LDL less than 70 11/28/2012   The 10-year ASCVD risk score Mikey Bussing DC Jr., et al., 2013) is: 30.7%   Values used to calculate the score:     Age: 57 years     Sex: Male     Is Non-Hispanic African American: No     Diabetic: No     Tobacco smoker: No     Systolic Blood Pressure: 614 mmHg     Is BP treated: No     HDL Cholesterol: 30.8 mg/dL     Total Cholesterol: 183 mg/dL   Hypoglycemia    Iron deficiency anemia due to dietary causes 12/07/2018   Lung nodule seen on imaging study 06/23/2018   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   Estimated Creatinine Clearance: 51.7 mL/min (by C-G formula based on SCr of 1.15 mg/dL).   Syncope    presented with atrial fib converted with Diltiazem   Systemic inflammatory response syndrome 06/26/2018   Thrombocytopenia 01/19/2021    SURGICAL HISTORY: Past Surgical History:  Procedure Laterality Date   CYSTOSCOPY WITH URETEROSCOPY AND STENT PLACEMENT Left 08/16/2018   Procedure: CYSTOSCOPY WITH LEFT RETROGRADE PYELOGRAM, BALLOON DILATION LEFT URETER, STENT PLACEMENT;  Surgeon: Lucas Mallow, MD;   Location: [facility];  Service: Urology;  Laterality: Left;   HERNIA REPAIR     INSERT / REPLACE / REMOVE PACEMAKER     LAPAROSCOPIC ASSISTED VENTRAL HERNIA REPAIR N/A 10/18/2018   Procedure: LAPAROSCOPIC ASSISTED VENTRAL WALL HERNIA REPAIR WITH MESH;  Surgeon: Michael Boston, MD;  Location: [facility];  Service: General;  Laterality: N/A;   LAPAROSCOPIC NEPHRECTOMY, HAND ASSISTED Left 10/18/2018   Procedure: LEFT HAND ASSISTED LAPAROSCOPIC RADICAL NEPHRECTOMY WITH ADRENALECTOMY;  Surgeon: Lucas Mallow, MD;  Location: [facility];  Service: Urology;  Laterality: Left;   LOOP RECORDER INSERTION N/A 04/26/2018   Procedure: LOOP RECORDER INSERTION;  Surgeon: Thompson Grayer, MD;  Location: [place] [facility];  Service: Cardiovascular;  Laterality: N/A;   LOOP RECORDER REMOVAL N/A 07/18/2018   Procedure: LOOP RECORDER REMOVAL;  Surgeon: Thompson Grayer, MD;  Location: [place] [facility];  Service: Cardiovascular;  Laterality: N/A;   NASAL SEPTUM SURGERY     PACEMAKER IMPLANT N/A 07/18/2018   Procedure: PACEMAKER IMPLANT;  Surgeon: Thompson Grayer, MD;  Location: [place] [facility];  Service: Cardiovascular;  Laterality: N/A;   TONSILLECTOMY     TONSILLECTOMY     UMBILICAL HERNIA REPAIR  1993   WISDOM TOOTH EXTRACTION  about 81 years old    SOCIAL HISTORY: Social History   Socioeconomic History   Marital status: Married    Spouse name: Not on file   Number of children: 1   Years of education: 12   Highest education level: High school graduate  Occupational History   Occupation: Retired    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  Tobacco Use   Smoking status: Former    Types: Cigarettes    Quit date: 03/02/1987    Years since quitting: 34.9   Smokeless tobacco: Never  Vaping Use   Vaping Use: Never used  Substance and Sexual Activity   Alcohol use: No    Alcohol/week: 0.0 standard drinks of alcohol   Drug use: No   Sexual activity: Not Currently  Other Topics  Concern   Not on file  Social History Narrative   Not on file   Social Determinants of Health   Financial Resource Strain: Low Risk  (04/15/2021)   Overall Financial Resource Strain (CARDIA)    Difficulty of Paying Living Expenses: Not hard at all  Food Insecurity: No Food Insecurity (04/15/2021)   Hunger Vital Sign    Worried About Running Out of Food in the Last Year: Never true    Ran Out of Food in the Last Year: Never true  Transportation Needs: No Transportation Needs (04/15/2021)   PRAPARE - Hydrologist (Medical): No    Lack of Transportation (Non-Medical): No  Physical Activity: Inactive (04/15/2021)   Exercise Vital Sign    Days of Exercise per Week: 0 days    Minutes of Exercise per Session: 0 min  Stress: No Stress Concern Present (04/15/2021)   [place]    Feeling of Stress : Not at all  Social Connections: Moderately Integrated (04/15/2021)   Social Connection and Isolation Panel [NHANES]    Frequency of Communication with Friends and Family: More than three times a week    Frequency of Social Gatherings with Friends and Family: More than three times a week    Attends Religious Services: More than 4 times per year    Active Member of [employer] or Organizations: No    Attends Archivist Meetings: Never    Marital Status: Married  Human resources officer Violence: Not At Risk (04/15/2021)   Humiliation, Afraid, Rape, and Kick questionnaire    Fear of Current or Ex-Partner: No    Emotionally Abused: No    Physically Abused: No    Sexually Abused: No    FAMILY HISTORY: Family History  Problem Relation Age of Onset   Emphysema Father    Memory loss Sister    Gallbladder disease Sister    Bipolar disorder Sister    Bipolar disorder Paternal Grandmother    Dementia Paternal Grandmother    Memory loss Paternal Grandmother    Colon cancer Neg Hx     ALLERGIES:  is  allergic to hydrocodone, monosodium glutamate, naproxen sodium, and other.  MEDICATIONS:  Current Facility-Administered Medications  Medication Dose Route Frequency Provider Last Rate Last Admin   0.9 %  sodium chloride infusion (Manually program via Guardrails IV Fluids)   Intravenous Once Estill Cotta, NP       0.9 %  sodium chloride infusion (Manually program via Guardrails IV Fluids)   Intravenous Once Margaretha Seeds, MD       0.9 %  sodium chloride infusion  Intravenous Continuous Olalere, Adewale A, MD 50 mL/hr at 02/07/22 1000 Infusion Verify at 02/07/22 1000   Chlorhexidine Gluconate Cloth 2 % PADS 6 each  6 each Topical Daily Olalere, Adewale A, MD       dexamethasone (DECADRON) injection 40 mg  40 mg Intravenous Daily Margaretha Seeds, MD   40 mg at 02/07/22 1237   docusate sodium (COLACE) capsule 100 mg  100 mg Oral BID PRN Olalere, Adewale A, MD       fentaNYL (SUBLIMAZE) injection 25 mcg  25 mcg Intravenous Q2H PRN Olalere, Adewale A, MD   25 mcg at 02/07/22 1105   Immune Globulin 10% (PRIVIGEN) IV infusion 65 g  1 g/kg (Adjusted) Intravenous Q24 Hr x 2 Yamilka Lopiccolo, Cloria Spring, MD       insulin aspart (novoLOG) injection 0-6 Units  0-6 Units Subcutaneous TID WC Margaretha Seeds, MD       loratadine (CLARITIN) tablet 10 mg  10 mg Oral Daily Brunetta Genera, MD   10 mg at 02/07/22 1240   ondansetron (ZOFRAN) injection 4 mg  4 mg Intravenous Q8H PRN Estill Cotta, NP   4 mg at 02/07/22 0009   pantoprazole (PROTONIX) injection 40 mg  40 mg Intravenous QHS Olalere, Adewale A, MD   40 mg at 02/07/22 0013   polyethylene glycol (MIRALAX / GLYCOLAX) packet 17 g  17 g Oral Daily PRN Olalere, Adewale A, MD        REVIEW OF SYSTEMS:    10 Point review of Systems was done is negative except as noted above.  PHYSICAL EXAMINATION: ECOG PERFORMANCE STATUS: 2 - Symptomatic, <50% confined to bed  . Vitals:   02/07/22 1200 02/07/22 1300  BP: 131/64 135/62  Pulse: 64 65   Resp: (!) 21 17  Temp: 99.1 F (37.3 C)   SpO2: 98% 94%   Filed Weights   02/06/22 1938 02/07/22 0500  Weight: 167 lb 8.8 oz (76 kg) 167 lb 5.3 oz (75.9 kg)   .Body mass index is 27.85 kg/m.  GENERAL:alert, in no acute distress and comfortable SKIN: no acute rashes, no significant lesions EYES: conjunctiva are pink and non-injected, sclera anicteric OROPHARYNX: MMM, no exudates, no oropharyngeal erythema or ulceration NECK: supple, no JVD LYMPH:  no palpable lymphadenopathy in the cervical, axillary or inguinal regions LUNGS: clear to auscultation b/l with normal respiratory effort HEART: regular rate & rhythm ABDOMEN:  normoactive bowel sounds , non tender, not distended. Extremity: no pedal edema PSYCH: alert & oriented x 3 with fluent speech NEURO: no focal motor/sensory deficits  LABORATORY DATA:  I have reviewed the data as listed  .    Latest Ref Rng & Units 02/07/2022   10:18 AM 02/07/2022    2:53 AM 02/07/2022    1:58 AM  CBC  WBC 4.0 - 10.5 K/uL   6.7   Hemoglobin 13.0 - 17.0 g/dL   11.1   Hematocrit 39.0 - 52.0 %   31.4   Platelets 150 - 400 K/uL 5  <5  <5     .    Latest Ref Rng & Units 02/07/2022    1:58 AM 02/06/2022   10:14 PM 01/12/2022    9:26 AM  CMP  Glucose 70 - 99 mg/dL 148  182  144   BUN 8 - 23 mg/dL 13  14  15    Creatinine 0.61 - 1.24 mg/dL 0.74  0.79  0.92   Sodium 135 - 145 mmol/L 130  127  132   Potassium 3.5 - 5.1 mmol/L 3.6  3.5  4.2   Chloride 98 - 111 mmol/L 99  97  99   CO2 22 - 32 mmol/L 20  20  28    Calcium 8.9 - 10.3 mg/dL 8.1  8.0  9.7   Total Protein 6.5 - 8.1 g/dL  6.3  7.1   Total Bilirubin 0.3 - 1.2 mg/dL  0.8  0.6   Alkaline Phos 38 - 126 U/L  37  42   AST 15 - 41 U/L  26  17   ALT 0 - 44 U/L  23  17    Component     Latest Ref Rng 02/07/2022  Prothrombin Time     11.4 - 15.2 seconds 16.9 (H)   INR     0.8 - 1.2  1.4 (H)   APTT     24 - 36 seconds 27   Fibrinogen     210 - 475 mg/dL 448   D-Dimer, Quant      0.00 - 0.50 ug/mL-FEU 3.07 (H)   Platelets     150 - 400 K/uL <5 (LL)   Smear Review NO SCHISTOCYTES SEEN     Legend: (H) High (LL) Low Panic  RADIOGRAPHIC STUDIES: I have personally reviewed the radiological images as listed and agreed with the findings in the report. CT HEAD WO CONTRAST (5MM)  Result Date: 02/07/2022 CLINICAL DATA:  Follow-up subdural hematoma. EXAM: CT HEAD WITHOUT CONTRAST TECHNIQUE: Contiguous axial images were obtained from the base of the skull through the vertex without intravenous contrast. RADIATION DOSE REDUCTION: This exam was performed according to the departmental dose-optimization program which includes automated exposure control, adjustment of the mA and/or kV according to patient size and/or use of iterative reconstruction technique. COMPARISON:  02/06/2022 and 04/24/2018 FINDINGS: Brain: Evidence of patient's acute subdural hematoma along the hemispheric fissure without significant change measuring 6 mm in thickness. Evidence of patient's known small bilateral patchy mixed attenuation subdural hematomas over the convexities right worse than left. No significant mass effect or midline shift. No intraparenchymal or subarachnoid hemorrhage. Ventricles, cisterns and other CSF spaces are otherwise unchanged. 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 Vascular: No hyperdense vessel or unexpected calcification. Skull: Normal. Negative for fracture or focal lesion. Sinuses/Orbits: No acute finding. Other: None. IMPRESSION: 1. Stable acute subdural hematoma along the hemispheric fissure without significant change measuring 6 mm in thickness. Evidence of patient's known small bilateral mixed chronicity subdural hematomas over the convexities right worse than left. No significant mass effect or midline shift. 2. 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 Electronically Signed   By: Marin Olp M.D.   On: 02/07/2022 11:47   DG Chest Port 1  View  Result Date: 02/06/2022 CLINICAL DATA:  Altered mental status EXAM: PORTABLE CHEST 1 VIEW COMPARISON:  CXR 07/19/18, CT Chest 11/24/21 FINDINGS: Left-sided dual lead cardiac device with unchanged lead positioning. No pleural effusion. No pneumothorax. Unchanged cardiac and mediastinal contours compared to prior exam there is a new hazy pulmonary opacity in the right lung base. No displaced rib fractures. Visualized upper abdomen is unremarkable. Aortic atherosclerotic calcifications. IMPRESSION: New hazy pulmonary opacity in the right lung base, which may represent atelectasis or infection in the appropriate clinical setting. Electronically Signed   By: Marin Roberts M.D.   On: 02/06/2022 11:05   CT HEAD WO CONTRAST (5MM)  Result Date: 02/06/2022 CLINICAL DATA:  Headache. EXAM: CT HEAD WITHOUT CONTRAST TECHNIQUE:  Contiguous axial images were obtained from the base of the skull through the vertex without intravenous contrast. RADIATION DOSE REDUCTION: This exam was performed according to the departmental dose-optimization program which includes automated exposure control, adjustment of the mA and/or kV according to patient size and/or use of iterative reconstruction technique. COMPARISON:  04/24/2018. FINDINGS: Brain: Positive for intracranial hemorrhage. Hyperattenuating subdural hemorrhage extends along the interhemispheric fissure, more prominently on the right, maximum thickness 7 mm. Small bilateral subdural hemorrhages, right greater than left, extend around both cerebral hemispheres, mixed attenuation, with areas of hyperattenuation, intermediate attenuation and low attenuation. No evidence of parenchymal or subarachnoid hemorrhage. No intraventricular hemorrhage. No midline shift. No evidence of an ischemic infarct. No parenchymal or extra-axial masses. Vascular: No hyperdense vessel or unexpected calcification. Skull: Normal. Negative for fracture or focal lesion. Sinuses/Orbits: Globes and orbits  are unremarkable. Sinuses are clear. Other: None. IMPRESSION: 1. Bilateral subdural hemorrhage as detailed, a significant portion of which appears acute/recent. No significant mass effect or midline shift. Critical Value/emergent results were called by telephone at the time of interpretation on 02/06/2022 at 10:44 am to provider [facility] , who verbally acknowledged these results. Electronically Signed   By: Lajean Manes M.D.   On: 02/06/2022 10:45   CUP PACEART REMOTE DEVICE CHECK  Result Date: 01/14/2022 Scheduled remote reviewed. Normal device function.  Next remote 91 days. Kathy Breach, RN, CCDS, [employer]   ASSESSMENT & PLAN:   81 yo with   1) Acute severe thrrombocytopenia with platelets <5k with refractoriness to platelet transfusions. 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 Slightly abnormal PT likely from Xarelto  2) previous mild chronic thrombocytopenia with PLT 90-120k in the last 3 months.  3) Acute subdural hematoma presenting with severe headaches. Notes some shakiness of RUE. No overt trauma. On Xarelto + NSAID use + severe thrombocytopenia. Current off Xarelto with use of Andexxa for reversal S/p platelet transfusions Dr Venetia Constable following.- surgery not currently possible due to sever thrombocytopenia  4) Metastatic renal cell carcinoma with pulmonary and pancreatic mets. S/p 2 cycles no Nivo3/Ipi 1 with last treatment 10/22/2021. 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 PLAN -EMR, labs and imaging studies reviewed in details -discussed with patient concern aboiut plt counts being very low and potential diagnostic considerations. 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 -Dexamethasone  40mg  daily x 4 days for induction rx of acute ITP -IVIG 1g/kg daily 2 doses with tylenol and claritin 10mg  as premedications -will check immature platelet fraction to evaluate for Bone marrow response. If inadequate response to steroids and IVIG might need to consider use of Nplate. -CBC twice daily with platelet transfusion to try to maintain plt >50k for active bleeding, >80k close to 100k for neurosurgery. -plz check platelet count 1 hour after each platelet transfusion to evaluate for platelet recovery. -case discussed with Dr Loanne Drilling  .The total time spent in the appointment was 80 minutes* .  All of the patient's questions were answered with apparent satisfaction. The patient knows to call the clinic with any problems, questions or concerns.   Sullivan Lone MD MS AAHIVMS [facility] Hematology/Oncology Physician [facility]  .*Total Encounter Time as defined by the Centers for Medicare and Medicaid Services includes, in addition to  the face-to-face time of a patient visit (documented in the note above) non-face-to-face time: obtaining and reviewing outside history, ordering and reviewing medications, tests or procedures, care coordination (communications with other health care professionals or caregivers) and documentation in the medical record.   02/07/2022 1:01 PM

## 2022-02-08 DIAGNOSIS — C7970 Secondary malignant neoplasm of unspecified adrenal gland: Secondary | ICD-10-CM | POA: Diagnosis not present

## 2022-02-08 DIAGNOSIS — D696 Thrombocytopenia, unspecified: Secondary | ICD-10-CM | POA: Diagnosis not present

## 2022-02-08 DIAGNOSIS — D693 Immune thrombocytopenic purpura: Secondary | ICD-10-CM | POA: Diagnosis not present

## 2022-02-08 DIAGNOSIS — C342 Malignant neoplasm of middle lobe, bronchus or lung: Secondary | ICD-10-CM | POA: Diagnosis not present

## 2022-02-08 DIAGNOSIS — C7800 Secondary malignant neoplasm of unspecified lung: Secondary | ICD-10-CM | POA: Diagnosis not present

## 2022-02-08 DIAGNOSIS — S065XAA Traumatic subdural hemorrhage with loss of consciousness status unknown, initial encounter: Secondary | ICD-10-CM | POA: Diagnosis not present

## 2022-02-08 LAB — PREPARE PLATELET PHERESIS
Unit division: 0
Unit division: 0
Unit division: 0
Unit division: 0
Unit division: 0
Unit division: 0
Unit division: 0

## 2022-02-08 LAB — BPAM PLATELET PHERESIS
Blood Product Expiration Date: 202312112359
Blood Product Expiration Date: 202312112359
Blood Product Expiration Date: 202312112359
Blood Product Expiration Date: 202312112359
Blood Product Expiration Date: 202312112359
Blood Product Expiration Date: 202312112359
Blood Product Expiration Date: 202312122359
ISSUE DATE / TIME: 202312091257
ISSUE DATE / TIME: 202312091500
ISSUE DATE / TIME: 202312100304
ISSUE DATE / TIME: 202312100304
ISSUE DATE / TIME: 202312100645
ISSUE DATE / TIME: 202312101314
ISSUE DATE / TIME: 202312101539
Unit Type and Rh: 7300
Unit Type and Rh: 7300
Unit Type and Rh: 5100
Unit Type and Rh: 6200
Unit Type and Rh: 6200
Unit Type and Rh: 6200
Unit Type and Rh: 9500

## 2022-02-08 LAB — GLUCOSE, CAPILLARY
Glucose-Capillary: 141 mg/dL — ABNORMAL HIGH (ref 70–99)
Glucose-Capillary: 187 mg/dL — ABNORMAL HIGH (ref 70–99)
Glucose-Capillary: 196 mg/dL — ABNORMAL HIGH (ref 70–99)
Glucose-Capillary: 199 mg/dL — ABNORMAL HIGH (ref 70–99)
Glucose-Capillary: 219 mg/dL — ABNORMAL HIGH (ref 70–99)
Glucose-Capillary: 278 mg/dL — ABNORMAL HIGH (ref 70–99)

## 2022-02-08 LAB — BASIC METABOLIC PANEL
Anion gap: 9 (ref 5–15)
BUN: 20 mg/dL (ref 8–23)
CO2: 22 mmol/L (ref 22–32)
Calcium: 8.2 mg/dL — ABNORMAL LOW (ref 8.9–10.3)
Chloride: 98 mmol/L (ref 98–111)
Creatinine, Ser: 0.8 mg/dL (ref 0.61–1.24)
GFR, Estimated: >60 mL/min (ref >60)
Glucose, Bld: 171 mg/dL — ABNORMAL HIGH (ref 70–99)
Potassium: 3.7 mmol/L (ref 3.5–5.1)
Sodium: 129 mmol/L — ABNORMAL LOW (ref 135–145)

## 2022-02-08 LAB — PROTIME-INR
INR: 1.3 — ABNORMAL HIGH (ref 0.8–1.2)
Prothrombin Time: 15.8 seconds — ABNORMAL HIGH (ref 11.4–15.2)

## 2022-02-08 LAB — APTT: aPTT: 30 seconds (ref 24–36)

## 2022-02-08 LAB — MAGNESIUM: Magnesium: 1.8 mg/dL (ref 1.7–2.4)

## 2022-02-08 MED ORDER — PANTOPRAZOLE SODIUM 40 MG PO TBEC
40.0000 mg | DELAYED_RELEASE_TABLET | Freq: Every day | ORAL | Status: DC
  Administered 2022-02-08 – 2022-02-12 (×4): 40 mg via ORAL
  Filled 2022-02-08 (×4): qty 1

## 2022-02-08 MED ORDER — BUTALBITAL-APAP-CAFFEINE 50-325-40 MG PO TABS
2.0000 | ORAL_TABLET | Freq: Four times a day (QID) | ORAL | Status: DC | PRN
  Administered 2022-02-08 – 2022-02-13 (×6): 2 via ORAL
  Filled 2022-02-08 (×6): qty 2

## 2022-02-08 MED ORDER — MAGNESIUM SULFATE 2 GM/50ML IV SOLN
2.0000 g | Freq: Once | INTRAVENOUS | Status: AC
  Administered 2022-02-08: 2 g via INTRAVENOUS
  Filled 2022-02-08: qty 50

## 2022-02-08 MED ORDER — POTASSIUM CHLORIDE CRYS ER 20 MEQ PO TBCR
40.0000 meq | EXTENDED_RELEASE_TABLET | Freq: Once | ORAL | Status: AC
  Administered 2022-02-08: 40 meq via ORAL
  Filled 2022-02-08: qty 2

## 2022-02-08 MED ORDER — ACETAMINOPHEN 325 MG PO TABS: 650.0000 mg | ORAL_TABLET | Freq: Four times a day (QID) | ORAL | Status: DC | PRN

## 2022-02-08 NOTE — Progress Notes (Addendum)
NAME:  Frank Ostlund., MRN:  [id number], DOB:  1941/01/20, LOS: 2 ADMISSION DATE:  02/06/2022, CONSULTATION DATE: 02/06/2022 REFERRING MD: Roderic Palau - EDP CHIEF COMPLAINT: Subdural hematoma  History of Present Illness:  81 year old man who presented to [facility] ED 12/9 with complaints of feeling generally unwell and headache x 1 week. Recently had his COVID booster shot about a week PTA. Soon after noted increased bruising on his arms and headache a day after his COVID vaccine; also with c/o nausea and epistaxis (seen by ENT). PMHx significant for HTN, HLD, PAF (on Eliquis), sick sinus syndrome (pacemaker in situ), AAA, CKD stage IIIb, RCC (s/p L radical nephrectomy 2020, lung/pancreatic involvement), BPH, GERD, diverticulosis.  On ED arrival, CT Head demonstrated bilateral SDH without significant mass effect or midline shift. CXR showed new hazy opacity at the R lung base. Labs were notable for Hgb 12.0, Plt < 5. Na 127, K 3.5, CO2 20, renal function WNL. Platelets and Andexxa were administered. NSGY consulted.  PCCM consulted and patient transferred to [facility] for ICU admission.  Pertinent Medical History:   Past Medical History:  Diagnosis Date   Abdominal aortic aneurysm (AAA) without rupture 06/23/2018   Age-related osteoporosis without current pathological fracture 07/14/2021   BPH (benign prostatic hyperplasia) 01/19/2010   Cervical radiculopathy 06/16/2018   Chronic anticoagulation 08/22/2018   Chronic bilateral low back pain without sciatica 07/18/2019   Chronic sinusitis 05/18/2021   Diverticulosis    Erectile dysfunction due to arterial insufficiency 04/23/2015   Esophageal stricture    Essential hypertension 05/01/2018   Estimated Creatinine Clearance: 44.1 mL/min (by C-G formula based on SCr of 1.35 mg/dL).   GERD (gastroesophageal reflux disease) 11/10/2010   Hearing loss    Hiatal hernia    Hyperlipidemia with target LDL less than 70 11/28/2012   The 10-year ASCVD risk score  Mikey Bussing DC Jr., et al., 2013) is: 30.7%   Values used to calculate the score:     Age: 24 years     Sex: Male     Is Non-Hispanic African American: No     Diabetic: No     Tobacco smoker: No     Systolic Blood Pressure: 269 mmHg     Is BP treated: No     HDL Cholesterol: 30.8 mg/dL     Total Cholesterol: 183 mg/dL   Hypoglycemia    Iron deficiency anemia due to dietary causes 12/07/2018   Lung nodule seen on imaging study 06/23/2018   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   Estimated Creatinine Clearance: 51.7 mL/min (by C-G formula based on SCr of 1.15 mg/dL).   Syncope    presented with atrial fib converted with Diltiazem   Systemic inflammatory response syndrome 06/26/2018   Thrombocytopenia 01/19/2021   Significant Hospital Events: Including procedures, antibiotic start and stop dates in addition to other pertinent events  12/9 Presented to [facility] ED for headache, increased bruising. CT Head with bilateral SDH, severe thrombocytopenia < 5. 2U Plt transfused. 12/10 Head CT showing bilateral subdural hematomas ~18mm in thickness, no evidence of shift or other evidence of mass effect. Heme consulted for thrombocytopenia, IVIG/dex given. Additional 5U Plt given. 12/11 Plt improved to 26. VSS. More interactive/alert. IVIG dose #2, continue dex.  Interim History /  Subjective:  No significant events overnight More interactive today, utilizing hearing aids VS remain stable Plt count improved to 26 s/p Plt x 7U and IVIG/dex Second dose IVIG today with premeds Continuing dexamethasone  Objective:  Blood pressure 133/62, pulse 63, temperature 97.9 F (36.6 C), temperature source Axillary, resp. rate 18, height 5\' 5"  (1.651 m), weight 75.9 kg, SpO2 91 %.        Intake/Output Summary (Last 24 hours) at 02/08/2022 0730 Last data filed at 02/08/2022 0600 Gross per 24 hour  Intake 1298.8 ml  Output 2400 ml  Net -1101.2 ml    Filed Weights   02/06/22 1938 02/07/22 0500  Weight: 76 kg 75.9 kg   Physical Examination: General: Acute-on-chronically ill-appearing elderly man in NAD. Pleasant and conversant, mildly HOH with hearing aids in place. HEENT: [place]/AT, anicteric sclera, PERRL, mild L ptosis, moist mucous membranes. Neuro: Awake, oriented x 3-4. Responds to verbal stimuli. Following commands consistently. Moves all 4 extremities spontaneously. Generalized weakness.  CV: RRR, no m/g/r. PULM: Breathing even and unlabored on 5LNC. Lung fields CTAB in upper fields, diminished at bilateral bases. GI: Soft, nontender, nondistended. Normoactive bowel sounds. Extremities: Trace bilateral symmetric LE edema noted. Skin: Warm/dry, scattered ecchymosis of BUE/BLE.  Resolved Hospital Problem List:    Assessment & Plan:   Bilateral subdural hematoma in setting of severe thrombocytopenia and recent Eliquis use On home Eliquis, took naproxen for headache. Received Andexxa and Plt transfusion on admission. - CT Head 12/10 with stable SDH, mental status improving. - Continue to hold antiplatelet/AC - Frequent neuro checks - NSGY following; no role for intervention at this time in the setting of severe thrombocytopenia and improving neurologic examination - Neuroprotective measures: HOB > 30 degrees, normoglycemia, normothermia, electrolytes  WNL  Thrombocytopenia Plt < 5 on admission. Smear neg schistocytes however Ddx includes immunotherapy delayed ITP vs malignancy induced ITP. Low suspicion for sepsis. 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 Followed by Dr. [name]. Previously on ipilimumab and nivolumab - Ipilimumab and nivolumab on hold per Oncology (12/03/2021)  Epistaxis Likely in the setting of severe thrombocytopenia. Seen by ENT 12/8. - Not a candidate for cauterization - No further epistaxis s/p Plt/Andexxa - Continue to monitor for active bleeding  Obstructive lung disease Emphysema Pulmonary fibrosis Previous CT scan findings of multiple nodules. - Further outpatient workup as clinically appropriate - Will need outpatient pulm follow up  Paroxysmal atrial fibrillation/SSS s/p PM HTN, HLD On Eliquis for PAF. Also history of SSS with pacemaker in situ. - Hold AC in the setting of bilateral SDH - Cardiac monitoring - Will require significant discussion with Cards, Neuro re: AC resumption, risk vs. Benefit - Optimize electrolytes with K > 4, Mg > 2  CKD stage IIIB - Trend BMP - Replete electrolytes as indicated - Monitor I&Os - Avoid nephrotoxic  agents as able - Ensure adequate renal perfusion  Best Practice: (right click and "Reselect all SmartList Selections" daily)   Diet/type: NPO w/ oral meds DVT prophylaxis: SCD GI prophylaxis: PPI Lines: N/A Foley:  N/A Code Status:  full code Last date of multidisciplinary goals of care discussion [Patient/wife updated at bedside 12/11]  Critical care time:   The patient is critically ill with multiple organ system failure and requires high complexity  decision making for assessment and support, frequent evaluation and titration of therapies, advanced monitoring, review of radiographic studies and interpretation of complex data.   Critical Care Time devoted to patient care services, exclusive of separately billable procedures, described in this note is 34 minutes.  Lestine Mount, PA-C [facility] 02/08/22 7:33 AM  Please see [email or web address] for pager details.  From 7A-7P if no response, please call [phone] After hours, please call ELink [phone]

## 2022-02-08 NOTE — Progress Notes (Signed)
IP PROGRESS NOTE  Subjective:   Frank [name] feels well this morning without any major complaints.  He still has some headaches but no blurry vision or neurological complaints.  He denies any altered mental status or confusion.  He denies any bleeding at this time.  He denies any petechiae or bruising.  He denies hematochezia or melena.  Objective:  Vital signs in last 24 hours: Temp:  [97.4 F (36.3 C)-99.1 F (37.3 C)] 98 F (36.7 C) (12/11 0800) Pulse Rate:  [60-82] 60 (12/11 0700) Resp:  [14-29] 15 (12/11 0700) BP: (107-142)/(49-79) 109/49 (12/11 0700) SpO2:  [85 %-98 %] 97 % (12/11 0700) Weight change:  Last BM Date :  (pta)  Intake/Output from previous day: 12/10 0701 - 12/11 0700 In: 1998.5 [I.V.:1392.5; Blood:606] Out: 2400 [Urine:2400] General: Alert, awake without distress. Head: Normocephalic atraumatic. Mouth: mucous membranes moist, pharynx normal without lesions Eyes: No scleral icterus.  Pupils are equal and round reactive to light. Resp: clear to auscultation bilaterally without rhonchi or wheezes or dullness to percussion. Cardio: regular rate and rhythm, S1, S2 normal, no murmur, click, rub or gallop GI: soft, non-tender; bowel sounds normal; no masses,  no organomegaly Musculoskeletal: No joint deformity or effusion. Neurological: No motor, sensory deficits.  Intact deep tendon reflexes. Skin: Ecchymosis noted injection sites.  Foley catheter remains in place and urine is clear.  Lab Results: Recent Labs    02/07/22 0158 02/07/22 0253 02/07/22 1018 02/08/22 0534  WBC 6.7  --   --  7.9  HGB 11.1*  --   --  10.0*  HCT 31.4*  --   --  27.9*  PLT <5*   < > 5* 26*   < > = values in this interval not displayed.    BMET Recent Labs    02/07/22 0158 02/08/22 0534  NA 130* 129*  K 3.6 3.7  CL 99 98  CO2 20* 22  GLUCOSE 148* 171*  BUN 13 20  CREATININE 0.74 0.80  CALCIUM 8.1* 8.2*    Studies/Results: CT HEAD WO CONTRAST (5MM)  Result Date:  02/07/2022 CLINICAL DATA:  Follow-up subdural hematoma. EXAM: CT HEAD WITHOUT CONTRAST TECHNIQUE: Contiguous axial images were obtained from the base of the skull through the vertex without intravenous contrast. RADIATION DOSE REDUCTION: This exam was performed according to the departmental dose-optimization program which includes automated exposure control, adjustment of the mA and/or kV according to patient size and/or use of iterative reconstruction technique. COMPARISON:  02/06/2022 and 04/24/2018 FINDINGS: Brain: Evidence of patient's acute subdural hematoma along the hemispheric fissure without significant change measuring 6 mm in thickness. Evidence of patient's known small bilateral patchy mixed attenuation subdural hematomas over the convexities right worse than left. No significant mass effect or midline shift. No intraparenchymal or subarachnoid hemorrhage. Ventricles, cisterns and other CSF spaces are otherwise unchanged. 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 Vascular: No hyperdense vessel or unexpected calcification. Skull: Normal. Negative for fracture or focal lesion. Sinuses/Orbits: No acute finding. Other: None. IMPRESSION: 1. Stable acute subdural hematoma along the hemispheric fissure without significant change measuring 6 mm in thickness. Evidence of patient's known small bilateral mixed chronicity subdural hematomas over the convexities right worse than left. No significant mass effect or midline shift. 2. 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 Electronically Signed   By: Marin Olp M.D.   On: 02/07/2022 11:47    Medications: I have reviewed the patient's current medications.  Assessment/Plan:  81 year old with:  1.  Thrombocytopenia presented with platelet count of less than 5000 in the setting of recent COVID immunization exposure to immunotherapy for his kidney cancer although his last treatment was 3 months ago.  He received dexamethasone  as well as IVIG with excellent improvement in his platelet count today up to 26.  Differential diagnosis was discussed today with the patient and his wife.  Given the response to steroids and IVIG this appears to be immune mediated process do not recommend any further intervention at this time.  Will continue dexamethasone till he has completed for recovery and then will be discontinued.  He will receive his second dose of IVIG today.  I recommend daily platelet checks and transfuse as clinically indicated if his CNS bleed worse.  I anticipate platelet recovery to be rapid.  Platelet count should be close to 100 or so in the next 48 hours.  2.  Metastatic kidney cancer: He is currently off treatment at this time.  This will be followed as an outpatient.   3.  Subdural hematoma: This occurred in the setting of severe thrombocytopenia as well as full dose anticoagulation.  Continues to be managed by neurosurgery with serial CT scans showed stable bleed without expansion on December 10.   4.  Follow-up disposition: We will continue to monitor his progress for his hospitalization.   35  minutes were dedicated to this visit.  50% of the time face-to-face and the time was spent on reviewing laboratory data, imaging studies, discussing treatment options, discussing differential diagnosis and answering questions regarding future plan.    LOS: 2 days   Zola Button 02/08/2022, 11:33 AM

## 2022-02-09 ENCOUNTER — Inpatient Hospital Stay (HOSPITAL_COMMUNITY): Payer: Medicare Other

## 2022-02-09 ENCOUNTER — Inpatient Hospital Stay: Payer: Medicare Other | Attending: Oncology

## 2022-02-09 ENCOUNTER — Encounter (HOSPITAL_COMMUNITY): Payer: Self-pay

## 2022-02-09 ENCOUNTER — Ambulatory Visit (HOSPITAL_COMMUNITY)
Admission: RE | Admit: 2022-02-09 | Discharge: 2022-02-09 | Disposition: A | Discharge status: Home / Self Care | Payer: Medicare Other | Source: Ambulatory Visit | Attending: Oncology | Admitting: Oncology

## 2022-02-09 DIAGNOSIS — C7970 Secondary malignant neoplasm of unspecified adrenal gland: Secondary | ICD-10-CM | POA: Insufficient documentation

## 2022-02-09 DIAGNOSIS — D693 Immune thrombocytopenic purpura: Secondary | ICD-10-CM | POA: Insufficient documentation

## 2022-02-09 DIAGNOSIS — C642 Malignant neoplasm of left kidney, except renal pelvis: Secondary | ICD-10-CM | POA: Insufficient documentation

## 2022-02-09 DIAGNOSIS — Z79899 Other long term (current) drug therapy: Secondary | ICD-10-CM | POA: Insufficient documentation

## 2022-02-09 DIAGNOSIS — C7800 Secondary malignant neoplasm of unspecified lung: Secondary | ICD-10-CM | POA: Insufficient documentation

## 2022-02-09 DIAGNOSIS — J9601 Acute respiratory failure with hypoxia: Secondary | ICD-10-CM | POA: Diagnosis not present

## 2022-02-09 DIAGNOSIS — J810 Acute pulmonary edema: Secondary | ICD-10-CM | POA: Diagnosis not present

## 2022-02-09 DIAGNOSIS — Z905 Acquired absence of kidney: Secondary | ICD-10-CM | POA: Insufficient documentation

## 2022-02-09 DIAGNOSIS — C7889 Secondary malignant neoplasm of other digestive organs: Secondary | ICD-10-CM | POA: Insufficient documentation

## 2022-02-09 DIAGNOSIS — R008 Other abnormalities of heart beat: Secondary | ICD-10-CM

## 2022-02-09 DIAGNOSIS — S065XAA Traumatic subdural hemorrhage with loss of consciousness status unknown, initial encounter: Secondary | ICD-10-CM | POA: Diagnosis not present

## 2022-02-09 DIAGNOSIS — D696 Thrombocytopenia, unspecified: Secondary | ICD-10-CM | POA: Diagnosis not present

## 2022-02-09 DIAGNOSIS — I6200 Nontraumatic subdural hemorrhage, unspecified: Secondary | ICD-10-CM | POA: Insufficient documentation

## 2022-02-09 LAB — CBC
HCT: 27.9 % — ABNORMAL LOW (ref 39.0–52.0)
HCT: 25.8 % — ABNORMAL LOW (ref 39.0–52.0)
Hemoglobin: 10 g/dL — ABNORMAL LOW (ref 13.0–17.0)
Hemoglobin: 9.3 g/dL — ABNORMAL LOW (ref 13.0–17.0)
MCH: 33.2 pg (ref 26.0–34.0)
MCH: 33.5 pg (ref 26.0–34.0)
MCHC: 35.8 g/dL (ref 30.0–36.0)
MCHC: 36 g/dL (ref 30.0–36.0)
MCV: 92.7 fL (ref 80.0–100.0)
MCV: 92.8 fL (ref 80.0–100.0)
Platelets: 26 10*3/uL — CL (ref 150–400)
Platelets: 73 10*3/uL — ABNORMAL LOW (ref 150–400)
RBC: 3.01 MIL/uL — ABNORMAL LOW (ref 4.22–5.81)
RBC: 2.78 MIL/uL — ABNORMAL LOW (ref 4.22–5.81)
RDW: 13.4 % (ref 11.5–15.5)
RDW: 13.6 % (ref 11.5–15.5)
WBC: 7.9 10*3/uL (ref 4.0–10.5)
WBC: 11.1 10*3/uL — ABNORMAL HIGH (ref 4.0–10.5)
nRBC: 0 % (ref 0.0–0.2)
nRBC: 0.2 % (ref 0.0–0.2)

## 2022-02-09 LAB — BPAM RBC
Blood Product Expiration Date: 202312202359
Blood Product Expiration Date: 202312202359
Unit Type and Rh: 6200
Unit Type and Rh: 6200

## 2022-02-09 LAB — TYPE AND SCREEN
ABO/RH(D): A POS
Antibody Screen: NEGATIVE
Unit division: 0
Unit division: 0

## 2022-02-09 LAB — COMPREHENSIVE METABOLIC PANEL
ALT: 38 U/L (ref 0–44)
AST: 37 U/L (ref 15–41)
Albumin: 2.6 g/dL — ABNORMAL LOW (ref 3.5–5.0)
Alkaline Phosphatase: 37 U/L — ABNORMAL LOW (ref 38–126)
Anion gap: 8 (ref 5–15)
BUN: 23 mg/dL (ref 8–23)
CO2: 21 mmol/L — ABNORMAL LOW (ref 22–32)
Calcium: 7.6 mg/dL — ABNORMAL LOW (ref 8.9–10.3)
Chloride: 98 mmol/L (ref 98–111)
Creatinine, Ser: 0.72 mg/dL (ref 0.61–1.24)
GFR, Estimated: >60 mL/min (ref >60)
Glucose, Bld: 231 mg/dL — ABNORMAL HIGH (ref 70–99)
Potassium: 4.1 mmol/L (ref 3.5–5.1)
Sodium: 127 mmol/L — ABNORMAL LOW (ref 135–145)
Total Bilirubin: 0.5 mg/dL (ref 0.3–1.2)
Total Protein: 6.9 g/dL (ref 6.5–8.1)

## 2022-02-09 LAB — ECHOCARDIOGRAM COMPLETE
AR max vel: 3.43 cm2
AV Area VTI: 3.39 cm2
AV Area mean vel: 3.4 cm2
AV Mean grad: 5 mmHg
AV Peak grad: 9.9 mmHg
Ao pk vel: 1.57 m/s
Area-P 1/2: 3.29 cm2
Calc EF: 61.9 %
Height: 65 in
P 1/2 time: 477 msec
S' Lateral: 2.9 cm
Single Plane A2C EF: 65 %
Single Plane A4C EF: 62.8 %
Weight: 2825.42 oz

## 2022-02-09 LAB — BASIC METABOLIC PANEL
Anion gap: 9 (ref 5–15)
BUN: 24 mg/dL — ABNORMAL HIGH (ref 8–23)
CO2: 20 mmol/L — ABNORMAL LOW (ref 22–32)
Calcium: 7.5 mg/dL — ABNORMAL LOW (ref 8.9–10.3)
Chloride: 95 mmol/L — ABNORMAL LOW (ref 98–111)
Creatinine, Ser: 0.78 mg/dL (ref 0.61–1.24)
GFR, Estimated: >60 mL/min (ref >60)
Glucose, Bld: 255 mg/dL — ABNORMAL HIGH (ref 70–99)
Potassium: 3.8 mmol/L (ref 3.5–5.1)
Sodium: 124 mmol/L — ABNORMAL LOW (ref 135–145)

## 2022-02-09 LAB — GLUCOSE, CAPILLARY
Glucose-Capillary: 124 mg/dL — ABNORMAL HIGH (ref 70–99)
Glucose-Capillary: 244 mg/dL — ABNORMAL HIGH (ref 70–99)
Glucose-Capillary: 344 mg/dL — ABNORMAL HIGH (ref 70–99)
Glucose-Capillary: 325 mg/dL — ABNORMAL HIGH (ref 70–99)

## 2022-02-09 LAB — PROTIME-INR
INR: 1.3 — ABNORMAL HIGH (ref 0.8–1.2)
Prothrombin Time: 15.7 seconds — ABNORMAL HIGH (ref 11.4–15.2)

## 2022-02-09 LAB — PHOSPHORUS
Phosphorus: 1.6 mg/dL — ABNORMAL LOW (ref 2.5–4.6)
Phosphorus: 2 mg/dL — ABNORMAL LOW (ref 2.5–4.6)

## 2022-02-09 LAB — MAGNESIUM: Magnesium: 2.1 mg/dL (ref 1.7–2.4)

## 2022-02-09 MED ORDER — POTASSIUM PHOSPHATES 15 MMOLE/5ML IV SOLN
45.0000 mmol | Freq: Once | INTRAVENOUS | Status: AC
  Administered 2022-02-09: 45 mmol via INTRAVENOUS
  Filled 2022-02-09: qty 15

## 2022-02-09 MED ORDER — FUROSEMIDE 10 MG/ML IJ SOLN
40.0000 mg | Freq: Once | INTRAMUSCULAR | Status: AC
  Administered 2022-02-09: 40 mg via INTRAVENOUS
  Filled 2022-02-09: qty 4

## 2022-02-09 MED ORDER — INSULIN ASPART 100 UNIT/ML IJ SOLN
0.0000 [IU] | Freq: Three times a day (TID) | INTRAMUSCULAR | Status: DC
  Administered 2022-02-09: 7 [IU] via SUBCUTANEOUS

## 2022-02-09 NOTE — Progress Notes (Signed)
[facility] Progress Note Patient Name: [name] DOB: 12/30/1940 MRN: [id number]   Date of Service  02/09/2022  HPI/Events of Note  Hyponatremia - Na+ = 129 --> 127. Chronic? Currently on 0.9 NaCl IV infusion at 50 mL/hour.   eICU Interventions  Plan: Continue to trend Na+.      Intervention Category Major Interventions: Electrolyte abnormality - evaluation and management  Sadako Cegielski [place] 02/09/2022, 5:32 AM

## 2022-02-09 NOTE — Evaluation (Signed)
Occupational Therapy Evaluation Patient Details Name: [name] MRN: [id number] DOB: 07-Apr-1940 Today's Date: 02/09/2022   History of Present Illness Frank [name] is a 81 y.o. man admitted 02/06/22 with epistaxis, severe HA. CT revealed bilateral mixed density SDH. 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   Clinical Impression   Pt from home with wife where he has previously been mod I for ambulation without DME, does his own ADL - wife does most of the IADL. Today he is generally min guard for UB ADL and min A for LB ADL. He presents with deficits in cognition and is a high fall risk due to decreased safety awareness, balance, and impulsivity. Pt would benefit from skilled OT in the acute setting and afterwards at the [facility] level to maximize safety and independence in ADL and functional transfers.       Recommendations for follow up therapy are one component of a multi-disciplinary discharge planning process, led by the attending physician.  Recommendations may be updated based on patient status, additional functional criteria and insurance authorization.   Follow Up Recommendations  Home health OT     Assistance Recommended at Discharge Frequent or constant Supervision/Assistance  Patient can return home with the following A little help with walking and/or transfers;A lot of help with bathing/dressing/bathroom;Assistance with cooking/housework;Direct supervision/assist for medications management;Direct supervision/assist for financial management;Assist for transportation;Help with stairs or ramp for entrance    Functional Status Assessment  Patient has had a recent decline in their  functional status and demonstrates the ability to make significant improvements in function in a reasonable and predictable amount of time.  Equipment Recommendations  None recommended by OT (Pt has appropriate DME)    Recommendations for Other Services PT consult;Speech consult (Palliative Medicine)     Precautions / Restrictions Precautions Precautions: Fall Precaution Comments: watch O2 Restrictions Weight Bearing Restrictions: No      Mobility Bed Mobility Overal bed mobility: Needs Assistance Bed Mobility: Supine to Sit     Supine to sit: Min guard     General bed mobility comments: supervision for safety and line management due to impulsivity    Transfers Overall transfer level: Needs assistance Equipment used: 1 person hand held assist Transfers: Sit to/from Stand Sit to Stand: Min guard           General transfer comment: min guard for safety due to impulsivity and decreased awareness of lines      Balance Overall balance assessment: Needs assistance Sitting-balance support: Feet supported, No upper extremity supported Sitting balance-Leahy Scale: Fair     Standing balance support: No upper extremity supported, During functional activity Standing balance-Leahy Scale: Fair Standing balance comment: pt stood at sink to brush teeth without UE support x 10 min                           ADL either performed or assessed with clinical judgement   ADL Overall ADL's : Needs assistance/impaired Eating/Feeding: Set up;Sitting   Grooming: Oral care;Min guard;Cueing for sequencing;Standing Grooming Details (indicate cue type and reason): pt perseverating on brushing teeth, slightly DOE 2/4 with standing activity. Upper Body Bathing: Min guard;Sitting Upper Body Bathing Details (indicate cue type and reason): educated wife that initially he should use shower chair for safety Lower  Body Bathing: Min guard Lower Body Bathing Details (indicate cue type  and reason): educated wife that he should use shower seat initially for fall prevention/safety Upper Body Dressing : Min guard;Sitting Upper Body Dressing Details (indicate cue type and reason): donning gown like robe Lower Body Dressing: Minimal assistance;With caregiver independent assisting;Sit to/from stand   Toilet Transfer: Minimal assistance;Cueing for safety;Ambulation Toilet Transfer Details (indicate cue type and reason): not open to use of RW Toileting- Clothing Manipulation and Hygiene: Minimal assistance   Tub/ Shower Transfer: Minimal assistance;Ambulation;Shower seat;With caregiver independent assisting   Functional mobility during ADLs: Minimal assistance (1 person HHA) General ADL Comments: deficits in cognition/safety awareness, balance, activity tolerance     Vision Baseline Vision/History: 1 Wears glasses Ability to See in Adequate Light: 0 Adequate Patient Visual Report: Other (comment) (denies) Vision Assessment?: Vision impaired- to be further tested in functional context Additional Comments: Pt frequently with one eye closed throughout session. States "its just dry eyes"  functionally mostly ok, sometimes missed the mark with depth perception during functional activities     Perception     Praxis      Pertinent Vitals/Pain Pain Assessment Pain Assessment: Faces (Pt states moderate pain "I cant give it a number") Faces Pain Scale: Hurts even more Pain Location: headache Pain Descriptors / Indicators: Headache Pain Intervention(s): Monitored during session, Repositioned     Hand Dominance Right   Extremity/Trunk Assessment Upper Extremity Assessment Upper Extremity Assessment: Generalized weakness (slightly edemaous)   Lower Extremity Assessment Lower Extremity Assessment: Defer to PT evaluation   Cervical / Trunk Assessment Cervical / Trunk Assessment: Kyphotic   Communication Communication Communication: HOH (VERY)   Cognition  Arousal/Alertness: Awake/alert Behavior During Therapy: Impulsive Overall Cognitive Status: Impaired/Different from baseline Area of Impairment: Attention, Following commands, Safety/judgement, Awareness, Problem solving                   Current Attention Level: Focused   Following Commands: Follows one step commands with increased time Safety/Judgement: Decreased awareness of safety, Decreased awareness of deficits Awareness: Emergent Problem Solving: Requires verbal cues, Requires tactile cues General Comments: pt very internal and externally distracted, pt with poor safety awareness, impulsive, decreased comprehension of situationperseverates on tasks and "procedures"     General Comments  pt with bruising t/o UEs and noted edema, pt and wife report them to be from working on their deck PTA. SPO2 at 87% on 3LO2 via [place] upon arrival, pt from 85-93% t/o session    Exercises     Shoulder Instructions      [place] expects to be discharged to:: Private residence Living Arrangements: Spouse/significant other Available Help at Discharge: Family;Available 24 hours/day Type of Home: House Home Access: Stairs to enter [employer] of Steps: 1 Entrance Stairs-Rails: None Home Layout: Two level;Bed/bath upstairs Alternate Level Stairs-Number of Steps: flight Alternate Level Stairs-Rails: Right Bathroom Shower/Tub: Occupational psychologist: Standard Bathroom Accessibility: Yes How Accessible: Accessible via walker Home Equipment: Rolling Walker (2 wheels);Shower seat;BSC/3in1   Additional Comments: wife confirming home set up      Prior Functioning/Environment Prior Level of Function : Independent/Modified Independent             Mobility Comments: does not use DME ADLs Comments: wife does IADL        OT Problem List: Decreased activity tolerance;Impaired balance (sitting and/or standing);Decreased cognition;Decreased safety  awareness;Decreased knowledge of use of DME or AE;Pain      OT Treatment/Interventions: Self-care/ADL training;Energy  conservation;DME and/or AE instruction;Therapeutic activities;Cognitive remediation/compensation;Patient/family education;Balance training    OT Goals(Current goals can be found in the care plan section) Acute Rehab OT Goals Patient Stated Goal: get home OT Goal Formulation: With patient/family Time For Goal Achievement: 02/23/22 Potential to Achieve Goals: Fair ADL Goals Pt Will Perform Grooming: with supervision;standing Pt Will Perform Upper Body Dressing: with modified independence;sitting Pt Will Perform Lower Body Dressing: with supervision;sit to/from stand Pt Will Transfer to Toilet: ambulating;with modified independence Pt Will Perform Toileting - Clothing Manipulation and hygiene: with modified independence;sit to/from stand Additional ADL Goal #1: Pt will verbalize 3 strategies for fall prevention during ADL with no cues  OT Frequency: Min 2X/week    Co-evaluation PT/OT/SLP Co-Evaluation/Treatment: Yes Reason for Co-Treatment: Necessary to address cognition/behavior during functional activity;For patient/therapist safety;To address functional/ADL transfers PT goals addressed during session: Mobility/safety with mobility;Balance;Strengthening/ROM OT goals addressed during session: ADL's and self-care;Strengthening/ROM      AM-PAC OT "6 Clicks" Daily Activity     Outcome Measure Help from another person eating meals?: A Little Help from another person taking care of personal grooming?: A Little Help from another person toileting, which includes using toliet, bedpan, or urinal?: A Little Help from another person bathing (including washing, rinsing, drying)?: A Little Help from another person to put on and taking off regular upper body clothing?: A Little Help from another person to put on and taking off regular lower body clothing?: A Lot 6 Click Score: 17    End of Session Equipment Utilized During Treatment: Gait belt;Oxygen Nurse Communication: Mobility status;Precautions  Activity Tolerance: Patient tolerated treatment well Patient left: in chair;with call bell/phone within reach;with chair alarm set;with family/visitor present  OT Visit Diagnosis: Unsteadiness on feet (R26.81);Other abnormalities of gait and mobility (R26.89);Muscle weakness (generalized) (M62.81);Other symptoms and signs involving cognitive function;Pain Pain - part of body:  (headache)                Time: 2902-1115 OT Time Calculation (min): 18 min Charges:  OT General Charges $OT Visit: 1 Visit OT Evaluation $OT Eval Moderate Complexity: [place] OTR/L Acute Rehabilitation Services Office: [place] 02/09/2022, 1:47 PM

## 2022-02-09 NOTE — Progress Notes (Signed)
NAME:  [name], MRN:  [id number], DOB:  [birth year], LOS: 3 ADMISSION DATE:  02/06/2022, CONSULTATION DATE: 02/06/2022 REFERRING MD: Roderic Palau - EDP CHIEF COMPLAINT: Subdural hematoma  History of Present Illness:  81 year old man who presented to [facility] ED 12/9 with complaints of feeling generally unwell and headache x 1 week. Recently had his COVID booster shot about a week PTA. Soon after noted increased bruising on his arms and headache a day after his COVID vaccine; also with c/o nausea and epistaxis (seen by ENT). PMHx significant for HTN, HLD, PAF (on Eliquis), sick sinus syndrome (pacemaker in situ), AAA, CKD stage IIIb, RCC (s/p L radical nephrectomy 2020, lung/pancreatic involvement), BPH, GERD, diverticulosis.  On ED arrival, CT Head demonstrated bilateral SDH without significant mass effect or midline shift. CXR showed new hazy opacity at the R lung base. Labs were notable for Hgb 12.0, Plt < 5. Na 127, K 3.5, CO2 20, renal function WNL. Platelets and Andexxa were administered. NSGY consulted.  PCCM consulted and patient transferred to [facility] for ICU admission.  Pertinent Medical History:   Past Medical History:  Diagnosis Date   Abdominal aortic aneurysm (AAA) without rupture 06/23/2018   Age-related osteoporosis without current pathological fracture 07/14/2021   BPH (benign prostatic hyperplasia) 01/19/2010   Cervical radiculopathy 06/16/2018   Chronic anticoagulation 08/22/2018   Chronic bilateral low back pain without sciatica 07/18/2019   Chronic sinusitis 05/18/2021   Diverticulosis    Erectile dysfunction due to arterial insufficiency 04/23/2015   Esophageal stricture    Essential hypertension 05/01/2018   Estimated Creatinine Clearance: 44.1 mL/min (by C-G formula based on SCr of 1.35 mg/dL).   GERD (gastroesophageal reflux disease) 11/10/2010   Hearing loss    Hiatal hernia    Hyperlipidemia with target LDL less than 70 11/28/2012   The 10-year ASCVD risk score  Mikey Bussing DC Jr., et al., 2013) is: 30.7%   Values used to calculate the score:     Age: 17 years     Sex: Male     Is Non-Hispanic African American: No     Diabetic: No     Tobacco smoker: No     Systolic Blood Pressure: 629 mmHg     Is BP treated: No     HDL Cholesterol: 30.8 mg/dL     Total Cholesterol: 183 mg/dL   Hypoglycemia    Iron deficiency anemia due to dietary causes 12/07/2018   Lung nodule seen on imaging study 06/23/2018   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   Estimated Creatinine Clearance: 51.7 mL/min (by C-G formula based on SCr of 1.15 mg/dL).   Syncope    presented with atrial fib converted with Diltiazem   Systemic inflammatory response syndrome 06/26/2018   Thrombocytopenia 01/19/2021   Significant Hospital Events: Including procedures, antibiotic start and stop dates in addition to other pertinent events  12/9 Presented to [facility] ED for headache, increased bruising. CT Head with bilateral SDH, severe thrombocytopenia < 5. 2U Plt transfused. 12/10 Head CT showing bilateral subdural hematomas ~95mm in thickness, no evidence of shift or other evidence of mass effect. Heme consulted for thrombocytopenia, IVIG/dex given. Additional 5U Plt given. 12/11 Plt improved to 26. VSS. More interactive/alert. IVIG dose #2, continue dex. 12/12 Plt 73 (26),  post-IVIG/dex. Improved mental status. Ongoing O2 needs. CXR with increased bilateral airspace disease, L > R small effusions. Lasix given.  Interim History / Subjective:  No significant events overnight Improving mental status Wants to go home, seems depressed/down, anxious/fearful he is "going to die" Plt improved to 73 from 26 S/p IVIG and dex CXR looks wet, Lasix 40mg  IV given  Objective:  Blood pressure 134/79, pulse 64, temperature 98.1 F (36.7 C), temperature source Oral, resp. rate (!) 24, height 5\' 5"  (1.651 m), weight 80.1 kg, SpO2 94 %.        Intake/Output Summary (Last 24 hours) at 02/09/2022 0746 Last data filed at 02/09/2022 1093 Gross per 24 hour  Intake 699.8 ml  Output 2500 ml  Net -1800.2 ml    Filed Weights   02/06/22 1938 02/07/22 0500 02/09/22 0500  Weight: 76 kg 75.9 kg 80.1 kg   Physical Examination: General: Chronically ill-appearing elderly man in NAD. Appears anxious. HEENT: [place]/AT, anicteric sclera, PERRL, moist mucous membranes. Neuro: Awake, oriented x 4. Responds to verbal stimuli. Following commands consistently. Moves all 4 extremities spontaneously. Generalized weakness.  CV: RRR, no m/g/r. PULM: Breathing even and unlabored on 3LNC. Lung fields with fine crackles throughout. GI: Soft, nontender, nondistended. Normoactive bowel sounds. Extremities: No significant LE edema noted. Skin: Warm/dry, no rashes. Scattered ecchymosis to BUE.  Resolved Hospital Problem List:    Assessment & Plan:   Bilateral subdural hematoma in setting of severe thrombocytopenia and recent Eliquis use On home Eliquis, took naproxen for headache. Received Andexxa and Plt transfusion on admission. CT Head 12/10 with stable SDH, mental status improving. - Continue to hold antiplatelet/AC - Frequent neuro checks - NSGY following; no role for intervention in the setting of severe thrombocytopenia, though this is improving - plan for reevaluation today 12/12 now that  platelets have improved - Neuroprotective measures: HOB > 30 degrees, normoglycemia, normothermia, electrolytes WNL  Thrombocytopenia Plt < 5 on admission. Smear neg schistocytes however Ddx includes immunotherapy delayed ITP vs malignancy induced ITP. Low suspicion for sepsis. Received 7U Plt in total 12/9-12/11. 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 Followed by Dr. [name]. Previously on ipilimumab and nivolumab - Ipilimumab and nivolumab held per Oncology (12/03/2021)  Epistaxis Likely in the setting of severe thrombocytopenia. Seen by ENT 12/8. - Not a candidate for cauterization - No further epistaxis s/p Plt/Andexxa - Continue to monitor for active bleeding - F/u with ENT as outpatient  Obstructive lung disease Emphysema Pulmonary fibrosis Previous CT scan findings of multiple nodules. - Further outpatient workup as clinically appropriate - Will require outpatient pulm follow up - Supplemental O2 for O2 sat 88-92% - Pulmonary hygiene - Bronchodilators PRN - Intermittent CXR - Lasix 40mg  IV x 1 12/12  Paroxysmal atrial fibrillation/SSS s/p PM HTN, HLD On Eliquis for PAF. Also history of SSS with pacemaker in situ. - Hold  AC in the setting of bilateral SDH - Cardiac monitoring - Will require significant discussion with Cards, NSGY re: AC resumption risk vs. benefit - Optimize electrolytes for K > 4, Mg > 2 - Repeat Echo  CKD stage IIIB - Trend BMP - Replete electrolytes as indicated - Monitor I&Os - Avoid nephrotoxic agents as able - Ensure adequate renal perfusion  Best Practice: (right click and "Reselect all SmartList Selections" daily)   Diet/type: Regular consistency (see orders) DVT prophylaxis: SCD GI prophylaxis:  PPI Lines: N/A Foley:  N/A Code Status:  full code Last date of multidisciplinary goals of care discussion [Patient/wife updated at bedside 12/11]  Critical care time:   The patient is critically ill with multiple organ system failure and requires high complexity decision making for assessment and support, frequent evaluation and titration of therapies, advanced monitoring, review of radiographic studies and interpretation of complex data.   Critical Care Time devoted to patient care services, exclusive of separately billable procedures, described in this note is 31 minutes.  Lestine Mount, PA-C [facility] 02/09/22 7:46 AM  Please see [email or web address] for pager details.  From 7A-7P if no response, please call [phone] After hours, please call ELink [phone]

## 2022-02-09 NOTE — Progress Notes (Signed)
Labs from February 09, 2022 were personally reviewed and continues to show improvement in his platelets currently at 24.  Based on these labs I do not recommend any further transfusion at this time.  He has completed his IVIG treatment.  I recommend continuing daily dexamethasone as we monitor his platelets.  This can be discontinued if his platelets normalized in the next 24 to 48 hours.  We will continue to follow

## 2022-02-09 NOTE — Evaluation (Addendum)
Physical Therapy Evaluation Patient Details Name: [name] MRN: [id number] DOB: 04-17-1940 Today's Date: 02/09/2022  History of Present Illness  Frank [name] is a 81 y.o. man admitted 02/06/22 with epistaxis, severe HA. CT revealed bilateral mixed density SDH. 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   Clinical Impression  Pt admitted with above. Pt presenting with poor safety awareness, impulsivity, very internally and externally distracted, decreased insight to situation, impaired balance, and increased fall risk. Spouse present and noted to assist pt quite a bit however reports he is indep. Unsure of true PLOF. Unsure of medical prognosis and if palliative care would be appropriate to get involved to aide in goals of care. Acute PT to cont to follow.       Recommendations for follow up therapy are one component of a multi-disciplinary discharge planning process, led by the attending physician.  Recommendations may be updated based on patient status, additional functional criteria and insurance authorization.  Follow Up Recommendations Home health PT      Assistance Recommended at Discharge Frequent or constant Supervision/Assistance  Patient can return home with the following  A little help with walking and/or transfers;A little help with bathing/dressing/bathroom;Direct supervision/assist for medications management;Assist for transportation;Help with stairs or ramp for entrance    Equipment Recommendations None recommended by PT (has recommend DME at home (RW & shower seat))  Recommendations for Other Services       Functional Status Assessment Patient has had a recent decline in  their functional status and demonstrates the ability to make significant improvements in function in a reasonable and predictable amount of time.     Precautions / Restrictions Precautions Precautions: Fall Precaution Comments: impaired cognition Restrictions Weight Bearing Restrictions: No      Mobility  Bed Mobility Overal bed mobility: Needs Assistance Bed Mobility: Supine to Sit     Supine to sit: Min guard     General bed mobility comments: supervision for safety and line management due to impulsivity    Transfers Overall transfer level: Needs assistance Equipment used: 1 person hand held assist Transfers: Sit to/from Stand Sit to Stand: Min guard           General transfer comment: min guard for safety due to impulsivity and decreased awareness of lines    Ambulation/Gait Ambulation/Gait assistance: Mod assist Gait Distance (Feet): 20 Feet Assistive device: 1 person hand held assist Gait Pattern/deviations: Step-through pattern, Decreased stride length Gait velocity: dec Gait velocity interpretation: <1.31 ft/sec, indicative of household ambulator   General Gait Details: pt quick to move and focused on going to the bathroom to brush his teeth, pt declining RW however kept holding onto bed and reaching for something to hold onto  Stairs            Wheelchair Mobility    Modified Rankin (Stroke Patients Only) Modified Rankin (Stroke Patients Only) Pre-Morbid Rankin Score: Slight disability Modified Rankin: Moderate disability     Balance Overall balance assessment: Needs assistance Sitting-balance support: Feet supported, No upper extremity supported Sitting balance-Leahy Scale: Fair     Standing balance support: No upper extremity supported, During functional activity Standing balance-Leahy Scale: Fair Standing balance comment: pt stood at sink to brush teeth without UE support x 10 min, pt focued/perseverating on brushing teeth 5x  Pertinent Vitals/Pain Pain Assessment Pain Assessment: Faces Faces Pain Scale: Hurts even more (pt reports moderate pain stating "I can't do the 1-10 scale") Pain Location: headache Pain Descriptors / Indicators: Headache Pain Intervention(s): Monitored during session    Home Living Family/patient expects to be discharged to:: Private residence Living Arrangements: Spouse/significant other Available Help at Discharge: Family;Available 24 hours/day Type of Home: House Home Access: Stairs to enter Entrance Stairs-Rails: None Entrance Stairs-Number of Steps: 1 Alternate Level Stairs-Number of Steps: flight Home Layout: Two level;Able to live on main level with bedroom/bathroom (his bed/bath upstairs but can stay in a room downstairs) Home Equipment: Rolling Walker (2 wheels);BSC/3in1;Shower seat      Prior Function Prior Level of Function : Independent/Modified Independent             Mobility Comments: amb without AD, was doing work in the yard ADLs Comments: indep however appears wife assists alot despite saying he is indep     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, 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 [employer]   Dominant Hand: Right    Extremity/Trunk Assessment   Upper Extremity Assessment Upper Extremity Assessment: Defer to OT evaluation    Lower Extremity Assessment Lower Extremity Assessment: Generalized weakness    Cervical / Trunk Assessment Cervical / Trunk Assessment: Kyphotic  Communication   Communication: HOH  Cognition Arousal/Alertness: Awake/alert Behavior During Therapy: Impulsive Overall Cognitive Status: Impaired/Different from baseline Area of Impairment: Attention, Following commands, Safety/judgement, Awareness, Problem solving                   Current Attention Level: Focused   Following Commands: Follows one step commands with increased time Safety/Judgement: Decreased awareness of safety, Decreased awareness of deficits Awareness: Emergent Problem Solving:  Requires verbal cues, Requires tactile cues General Comments: pt very internal and externally distracted, pt with poor safety awareness, impulsive, decreased comprehension of situation of purpose of PT despite education        General Comments General comments (skin integrity, edema, etc.): pt with bruising t/o UEs and noted edema, pt and wife report them to be from working on their deck PTA. SPO2 at 87% on 3LO2 via [place] upon arrival, pt from 85-93% t/o session    Exercises     Assessment/Plan    PT Assessment Patient needs continued PT services  PT Problem List Decreased strength;Decreased activity tolerance;Decreased balance;Decreased mobility;Decreased cognition;Decreased safety awareness       PT Treatment Interventions DME instruction;Gait training;Stair training;Functional mobility training;Therapeutic exercise;Therapeutic activities;Balance training;Neuromuscular re-education    PT Goals (Current goals can be found in the Care Plan section)  Acute Rehab PT Goals Patient Stated Goal: home PT Goal Formulation: With patient/family Time For Goal Achievement: 02/23/22 Potential to Achieve Goals: Good Additional Goals Additional Goal #1: Pt to score >19 on DGI to indicate minimal falls risk.    Frequency Min 3X/week     Co-evaluation               AM-PAC PT "6 Clicks" Mobility  Outcome Measure Help needed turning from your back to your side while in a flat bed without using bedrails?: None Help needed moving from lying on your back to sitting on the side of a flat bed without using bedrails?: None Help needed moving to and from a bed to a chair (including a wheelchair)?: None Help needed standing up from a chair using your arms (e.g., wheelchair or bedside chair)?: A Little Help needed to walk in hospital room?: A Little Help needed climbing 3-5 steps with a railing? : A Lot  6 Click Score: 20    End of Session Equipment Utilized During Treatment: Gait  belt;Oxygen Activity Tolerance: Patient tolerated treatment well Patient left: in chair;with call bell/phone within reach;with chair alarm set;with family/visitor present Nurse Communication: Mobility status PT Visit Diagnosis: Unsteadiness on feet (R26.81);Difficulty in walking, not elsewhere classified (R26.2)    Time: 5615-3794 PT Time Calculation (min) (ACUTE ONLY): 24 min   Charges:   PT Evaluation $PT Eval Moderate Complexity: 1 Mod          Kittie Plater, PT, DPT Acute Rehabilitation Services Secure chat preferred Office #: [phone]   Berline Lopes 02/09/2022, 11:43 AM

## 2022-02-09 NOTE — Progress Notes (Signed)
  Echocardiogram 2D Echocardiogram has been performed.  Frank [name] 02/09/2022, 3:49 PM

## 2022-02-09 NOTE — Inpatient Diabetes Management (Signed)
Inpatient Diabetes Program Recommendations  AACE/ADA: New Consensus Statement on Inpatient Glycemic Control   Target Ranges:  Prepandial:   less than 140 mg/dL      Peak postprandial:   less than 180 mg/dL (1-2 hours)      Critically ill patients:  140 - 180 mg/dL    Latest Reference Range & Units 02/09/22 08:24 02/09/22 11:22  Glucose-Capillary 70 - 99 mg/dL 124 (H) 244 (H)    Latest Reference Range & Units 02/08/22 07:36 02/08/22 11:53 02/08/22 15:05 02/08/22 19:55  Glucose-Capillary 70 - 99 mg/dL 141 (H) 196 (H) 219 (H) 187 (H)   Review of Glycemic Control  Diabetes history: NO Outpatient Diabetes medications: NA Current orders for Inpatient glycemic control: Novolog 0-6 units TID; Decadron 40 mg daily  Inpatient Diabetes Program Recommendations:    Insulin: May want to consider increasing Novolog correction to 0-9 units TID with meals.  Thanks, Barnie Alderman, RN, MSN, [place] Diabetes Coordinator Inpatient Diabetes Program [phone] (Team Pager from 8am to [place])

## 2022-02-09 NOTE — Progress Notes (Signed)
[facility] ADULT ICU REPLACEMENT PROTOCOL   The patient does apply for the [facility] Adult ICU Electrolyte Replacment Protocol based on the criteria listed below:   1.Exclusion criteria: TCTS, ECMO, Dialysis, and Myasthenia Gravis patients 2. Is GFR >/= 30 ml/min? Yes.    Patient's GFR today is >60 3. Is SCr </= 2? Yes.   Patient's SCr is 0.72 mg/dL 4. Did SCr increase >/= 0.5 in 24 hours? No. 5.Pt's weight >40kg  Yes.   6. Abnormal electrolyte(s): Phos 1.6  7. Electrolytes replaced per protocol 8.  Call MD STAT for K+ </= 2.5, Phos </= 1, or Mag </= 1 Physician:  Bonne Dolores, Maisha Bogen A 02/09/2022 6:06 AM

## 2022-02-10 ENCOUNTER — Inpatient Hospital Stay (HOSPITAL_COMMUNITY): Payer: Medicare Other

## 2022-02-10 DIAGNOSIS — R410 Disorientation, unspecified: Secondary | ICD-10-CM | POA: Diagnosis not present

## 2022-02-10 DIAGNOSIS — S065XAA Traumatic subdural hemorrhage with loss of consciousness status unknown, initial encounter: Secondary | ICD-10-CM | POA: Diagnosis not present

## 2022-02-10 LAB — BASIC METABOLIC PANEL
Anion gap: 12 (ref 5–15)
Anion gap: 8 (ref 5–15)
BUN: 24 mg/dL — ABNORMAL HIGH (ref 8–23)
BUN: 22 mg/dL (ref 8–23)
CO2: 21 mmol/L — ABNORMAL LOW (ref 22–32)
CO2: 21 mmol/L — ABNORMAL LOW (ref 22–32)
Calcium: 7.7 mg/dL — ABNORMAL LOW (ref 8.9–10.3)
Calcium: 7.5 mg/dL — ABNORMAL LOW (ref 8.9–10.3)
Chloride: 95 mmol/L — ABNORMAL LOW (ref 98–111)
Chloride: 98 mmol/L (ref 98–111)
Creatinine, Ser: 0.85 mg/dL (ref 0.61–1.24)
Creatinine, Ser: 0.95 mg/dL (ref 0.61–1.24)
GFR, Estimated: >60 mL/min (ref >60)
GFR, Estimated: >60 mL/min (ref >60)
Glucose, Bld: 259 mg/dL — ABNORMAL HIGH (ref 70–99)
Glucose, Bld: 254 mg/dL — ABNORMAL HIGH (ref 70–99)
Potassium: 3.8 mmol/L (ref 3.5–5.1)
Potassium: 4.4 mmol/L (ref 3.5–5.1)
Sodium: 128 mmol/L — ABNORMAL LOW (ref 135–145)
Sodium: 127 mmol/L — ABNORMAL LOW (ref 135–145)

## 2022-02-10 LAB — GLUCOSE, CAPILLARY
Glucose-Capillary: 120 mg/dL — ABNORMAL HIGH (ref 70–99)
Glucose-Capillary: 134 mg/dL — ABNORMAL HIGH (ref 70–99)
Glucose-Capillary: 184 mg/dL — ABNORMAL HIGH (ref 70–99)
Glucose-Capillary: 221 mg/dL — ABNORMAL HIGH (ref 70–99)
Glucose-Capillary: 266 mg/dL — ABNORMAL HIGH (ref 70–99)

## 2022-02-10 LAB — CBC
HCT: 27.6 % — ABNORMAL LOW (ref 39.0–52.0)
Hemoglobin: 10.3 g/dL — ABNORMAL LOW (ref 13.0–17.0)
MCH: 33.9 pg (ref 26.0–34.0)
MCHC: 37.3 g/dL — ABNORMAL HIGH (ref 30.0–36.0)
MCV: 90.8 fL (ref 80.0–100.0)
Platelets: 104 10*3/uL — ABNORMAL LOW (ref 150–400)
RBC: 3.04 MIL/uL — ABNORMAL LOW (ref 4.22–5.81)
RDW: 13.8 % (ref 11.5–15.5)
WBC: 7.3 10*3/uL (ref 4.0–10.5)
nRBC: 0.7 % — ABNORMAL HIGH (ref 0.0–0.2)

## 2022-02-10 LAB — PHOSPHORUS
Phosphorus: 1.8 mg/dL — ABNORMAL LOW (ref 2.5–4.6)
Phosphorus: 2 mg/dL — ABNORMAL LOW (ref 2.5–4.6)

## 2022-02-10 LAB — MAGNESIUM: Magnesium: 1.9 mg/dL (ref 1.7–2.4)

## 2022-02-10 MED ORDER — SODIUM PHOSPHATES 45 MMOLE/15ML IV SOLN
30.0000 mmol | Freq: Once | INTRAVENOUS | Status: AC
  Administered 2022-02-11: 30 mmol via INTRAVENOUS
  Filled 2022-02-10: qty 10

## 2022-02-10 MED ORDER — INSULIN ASPART 100 UNIT/ML IJ SOLN
0.0000 [IU] | INTRAMUSCULAR | Status: DC
  Administered 2022-02-10: 3 [IU] via SUBCUTANEOUS
  Administered 2022-02-10: 1 [IU] via SUBCUTANEOUS
  Administered 2022-02-10: 5 [IU] via SUBCUTANEOUS
  Administered 2022-02-11 (×3): 1 [IU] via SUBCUTANEOUS
  Administered 2022-02-11 (×3): 2 [IU] via SUBCUTANEOUS
  Administered 2022-02-12: 3 [IU] via SUBCUTANEOUS
  Administered 2022-02-12 (×2): 2 [IU] via SUBCUTANEOUS
  Administered 2022-02-12: 3 [IU] via SUBCUTANEOUS
  Administered 2022-02-12: 1 [IU] via SUBCUTANEOUS
  Administered 2022-02-13 (×3): 2 [IU] via SUBCUTANEOUS

## 2022-02-10 MED ORDER — MIDAZOLAM HCL 2 MG/2ML IJ SOLN
0.5000 mg | Freq: Once | INTRAMUSCULAR | Status: AC
  Administered 2022-02-10: 0.5 mg via INTRAVENOUS
  Filled 2022-02-10: qty 2

## 2022-02-10 MED ORDER — ACETAMINOPHEN 650 MG RE SUPP
650.0000 mg | Freq: Four times a day (QID) | RECTAL | Status: DC | PRN
  Administered 2022-02-11: 650 mg via RECTAL
  Filled 2022-02-10: qty 1

## 2022-02-10 MED ORDER — AMIODARONE HCL IN DEXTROSE 360-4.14 MG/200ML-% IV SOLN
30.0000 mg/h | INTRAVENOUS | Status: DC
  Administered 2022-02-10: 30 mg/h via INTRAVENOUS
  Filled 2022-02-10 (×2): qty 200

## 2022-02-10 MED ORDER — AMIODARONE LOAD VIA INFUSION
150.0000 mg | Freq: Once | INTRAVENOUS | Status: AC
  Administered 2022-02-10: 150 mg via INTRAVENOUS
  Filled 2022-02-10: qty 83.34

## 2022-02-10 MED ORDER — MAGNESIUM SULFATE 2 GM/50ML IV SOLN
2.0000 g | Freq: Once | INTRAVENOUS | Status: AC
  Administered 2022-02-10: 2 g via INTRAVENOUS
  Filled 2022-02-10: qty 50

## 2022-02-10 MED ORDER — POTASSIUM PHOSPHATES 15 MMOLE/5ML IV SOLN
30.0000 mmol | Freq: Once | INTRAVENOUS | Status: AC
  Administered 2022-02-10: 30 mmol via INTRAVENOUS
  Filled 2022-02-10: qty 10

## 2022-02-10 MED ORDER — DEXMEDETOMIDINE HCL IN NACL 400 MCG/100ML IV SOLN
INTRAVENOUS | Status: AC
  Filled 2022-02-10: qty 100

## 2022-02-10 MED ORDER — INSULIN ASPART 100 UNIT/ML IJ SOLN: 0.0000 [IU] | Freq: Three times a day (TID) | INTRAMUSCULAR | Status: DC

## 2022-02-10 MED ORDER — DEXMEDETOMIDINE HCL IN NACL 400 MCG/100ML IV SOLN
0.1000 ug/kg/h | INTRAVENOUS | Status: DC
  Administered 2022-02-10: 0.2 ug/kg/h via INTRAVENOUS

## 2022-02-10 MED ORDER — AMIODARONE HCL IN DEXTROSE 360-4.14 MG/200ML-% IV SOLN
60.0000 mg/h | INTRAVENOUS | Status: AC
  Administered 2022-02-10 (×2): 60 mg/h via INTRAVENOUS
  Filled 2022-02-10: qty 200

## 2022-02-10 NOTE — Progress Notes (Signed)
[place] Progress Note Patient Name: [name] DOB: Oct 13, 1940 MRN: [id number]   Date of Service  02/10/2022  HPI/Events of Note  Hypophosphatemia - PO4--- = 2.0, Na+ = 127, K+ = 4.4, and Creatinine = 0.95.  eICU Interventions  Plan: Will replace PO4--- with Na3PO4. Repeat Phosphorus level at 5 AM.     Intervention Category Major Interventions: Electrolyte abnormality - evaluation and management  Lysle Dingwall 02/10/2022, 10:38 PM

## 2022-02-10 NOTE — Progress Notes (Incomplete)
Patient began coughing around 2320. This RN went to assess the patient and noticed dark red/brown blood on patients lips and multiple small clots in NRB mask as well as 2 dime sized clots that the patient expectorated.   At this time Elink was contacted and Dr. Emmit Alexanders ordered

## 2022-02-10 NOTE — Progress Notes (Addendum)
[place] Progress Note Patient Name: [name] DOB: Mar 04, 1940 MRN: [id number]   Date of Service  02/10/2022  HPI/Events of Note  Severe Agitation - Patient desaturates with episodes of agitation. Trying to get OOB. Nursing request for Posey belt.   eICU Interventions  Plan: Portable CXR already ordered for this AM. Please be sure that it gets done.  Low dose Precedex IV infusion (0.1-0.4 mcg/kg/hour). Titrate to RASS = 0. Posey belt restraint X 4 hours.      Intervention Category Major Interventions: Delirium, psychosis, severe agitation - evaluation and management  Parminder Trapani Eugene 02/10/2022, 5:56 AM

## 2022-02-10 NOTE — Progress Notes (Signed)
Patient started getting restless after about 0230 complains that he "felt like he was going to die". He also c/o needing to have a BM and wanting to go to bathroom. Attempted 3x to get patient up to a BSC. He is very HOH and became less compliant with safety measures (allowing 2 people to help him to [facility]). RNs placed him back in bed when he was unable to have a BM. Progressively worsened restlessness & agitation. Notified Dr Oletta Darter who ordered a versed push.  Gave 0.5mg  versed with no resolution of symptoms. Notified Dr Oletta Darter of continued agitation as well as HR up to 170s and appears to be afib RVR. Order received for Posey & low dose precedex gtt to see if it helps agitation and HR. Spoke with other staff members who stated patient has not slept much in the last 3 days. Strength equal in all 4 extremities and pupils remain equal.

## 2022-02-10 NOTE — Progress Notes (Signed)
Neurosurgery Service Progress Note  Subjective: Had some agitation overnight with confusion, improved with precedex, now d/c'd but still a little somnolent   Objective: Vitals:   02/10/22 0800 02/10/22 0803 02/10/22 0807 02/10/22 0900  BP: (!) 75/40 (!) 81/55 93/65 (!) 88/47  Pulse: (!) 123 (!) 129 (!) 121 (!) 114  Resp: (!) 22 (!) 28 (!) 22 (!) 21  Temp: 97.9 F (36.6 C)     TempSrc: Axillary     SpO2: 93% (!) 88% 93% (!) 87%  Weight:      Height:        Physical Exam: Eyes closed to voice, but nods yes and no to questions and raises eyebrows to voice but won't open eyes, PERRL, gaze conjugate, MAEx4 purposely, laying on his side with arms folded underneath his head for support  Assessment & Plan: 81 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  -will d/w Dr. Kathyrn Sheriff to see if he's a candidate for MMA embolization -suspect he will improve as the precedex comes off, currently non-focal, if he metabolizes the precedex and is more awake but speech still lags, we should repeat the [place]  02/10/22 9:52 AM

## 2022-02-10 NOTE — Progress Notes (Signed)
[place] Progress Note Patient Name: [name] DOB: 1941-01-02 MRN: [id number]   Date of Service  02/10/2022  HPI/Events of Note  Agitation - Patient woke up anxious and confused. Thought he was dying. Trying to get OOB,   eICU Interventions  Plan: Versed 0.5 mg IV X 1.     Intervention Category Major Interventions: Delirium, psychosis, severe agitation - evaluation and management  Myrl Bynum Eugene 02/10/2022, 4:26 AM

## 2022-02-10 NOTE — Progress Notes (Signed)
[place] Progress Note Patient Name: [name] DOB: 1940/08/28 MRN: [id number]   Date of Service  02/10/2022  HPI/Events of Note  Patient c/o HA. Not able to take Tylenol PO.   eICU Interventions  Plan: D/C Tylenol PO. Tylenol Suppository 650 mg PR Q 6 hours PRN Temp > 101.0 F, pain or HA.     Intervention Category Major Interventions: Other:  Lysle Dingwall 02/10/2022, 8:06 PM

## 2022-02-10 NOTE — Progress Notes (Signed)
Patient began coughing around 2320. This RN went to assess the patient and noticed dark red/brown blood on patients lips and multiple small clots in NRB mask as well as 2 dime sized clots that the patient expectorated. Pt c/o "throat is really dry."  At this time Elink was contacted and Dr. Emmit Alexanders ordered STAT labs.   Pt still c/o HA, but refuses suppository tylenol. Elink was notified of patient preference and this RN waiting for additional orders.   Will continue with patient care plan at this time.   02/11/2022 Mike Gip, RN 12:05 AM

## 2022-02-10 NOTE — Progress Notes (Addendum)
NAME:  Frank Kye., MRN:  [id number], DOB:  [birth year], LOS: 4 ADMISSION DATE:  02/06/2022, CONSULTATION DATE: 02/06/2022 REFERRING MD: Roderic Palau - EDP CHIEF COMPLAINT: Subdural hematoma  History of Present Illness:  81 year old man who presented to [facility] ED 12/9 with complaints of feeling generally unwell and headache x 1 week. Recently had his COVID booster shot about a week PTA. Soon after noted increased bruising on his arms and headache a day after his COVID vaccine; also with c/o nausea and epistaxis (seen by ENT). PMHx significant for HTN, HLD, PAF (on Eliquis), sick sinus syndrome (pacemaker in situ), AAA, CKD stage IIIb, RCC (s/p L radical nephrectomy 2020, lung/pancreatic involvement), BPH, GERD, diverticulosis.  On ED arrival, CT Head demonstrated bilateral SDH without significant mass effect or midline shift. CXR showed new hazy opacity at the R lung base. Labs were notable for Hgb 12.0, Plt < 5. Na 127, K 3.5, CO2 20, renal function WNL. Platelets and Andexxa were administered. NSGY consulted.  PCCM consulted and patient transferred to [facility] for ICU admission.  Pertinent Medical History:   Past Medical History:  Diagnosis Date   Abdominal aortic aneurysm (AAA) without rupture 06/23/2018   Age-related osteoporosis without current pathological fracture 07/14/2021   BPH (benign prostatic hyperplasia) 01/19/2010   Cervical radiculopathy 06/16/2018   Chronic anticoagulation 08/22/2018   Chronic bilateral low back pain without sciatica 07/18/2019   Chronic sinusitis 05/18/2021   Diverticulosis    Erectile dysfunction due to arterial insufficiency 04/23/2015   Esophageal stricture    Essential hypertension 05/01/2018   Estimated Creatinine Clearance: 44.1 mL/min (by C-G formula based on SCr of 1.35 mg/dL).   GERD (gastroesophageal reflux disease) 11/10/2010   Hearing loss    Hiatal hernia    Hyperlipidemia with target LDL less than 70 11/28/2012   The 10-year ASCVD risk score  Mikey Bussing DC Jr., et al., 2013) is: 30.7%   Values used to calculate the score:     Age: 68 years     Sex: Male     Is Non-Hispanic African American: No     Diabetic: No     Tobacco smoker: No     Systolic Blood Pressure: 932 mmHg     Is BP treated: No     HDL Cholesterol: 30.8 mg/dL     Total Cholesterol: 183 mg/dL   Hypoglycemia    Iron deficiency anemia due to dietary causes 12/07/2018   Lung nodule seen on imaging study 06/23/2018   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   Estimated Creatinine Clearance: 51.7 mL/min (by C-G formula based on SCr of 1.15 mg/dL).   Syncope    presented with atrial fib converted with Diltiazem   Systemic inflammatory response syndrome 06/26/2018   Thrombocytopenia 01/19/2021   Significant Hospital Events: Including procedures, antibiotic start and stop dates in addition to other pertinent events  12/9 Presented to [facility] ED for headache, increased bruising. CT Head with bilateral SDH, severe thrombocytopenia < 5. 2U Plt transfused. 12/10 Head CT showing bilateral subdural hematomas ~83mm in thickness, no evidence of shift or other evidence of mass effect. Heme consulted for thrombocytopenia, IVIG/dex given. Additional 5U Plt given. 12/11 Plt improved to 26. VSS. More interactive/alert. IVIG dose #2, continue dex. 12/12 Plt 73 (26),  post-IVIG/dex. Improved mental status. Ongoing O2 needs. CXR with increased bilateral airspace disease, L > R small effusions. Lasix given.  Interim History / Subjective:  Agitation overnight now on precedex 0.3 with PAF  Episode of hypoxia, up to 10L HFNC> now down to 3L [place] Plts continue to improve 73> 103, no evidence of bleeding  Objective:  Blood pressure 99/64, pulse (!) 121, temperature 100 F (37.8 C), temperature source Axillary, resp. rate (!) 28, height 5\' 5"  (1.651 m), weight 80 kg, SpO2 96 %.        Intake/Output Summary (Last 24 hours) at 02/10/2022 0748 Last data filed at 02/10/2022 0700 Gross per 24 hour  Intake 1024.65 ml  Output 4650 ml  Net -3625.35 ml   Filed Weights   02/07/22 0500 02/09/22 0500 02/10/22 0421  Weight: 75.9 kg 80.1 kg 80 kg   Physical Examination: General:  chronically ill elderly male lying in bed, no distress Neuro:  sleeping, becomes agitated and restless with assessment, not verbal, moves all extremities, opens eyes, pupils 5/reactive CV: rr, ST 120's, no murmur PULM:  non labored, tachypneic, clear anteriorly, bibasilar posterior rales, congested cough GI: soft, bs+, ND, condom cath Extremities: warm/dry, no LE edema  Skin: scattered ecchymosis to BUE  Tmax 100 UOP 3.7L/ 24hrs -2.8L Net -1.1L  Labs> Na 128, K 3.8, sCr 0.85, Mag 1.9, WBC 7.3, Hgb 10.3, plts 73> 104 CBG trend 121>344 CXR> improved, but residual mild pulmonary edema TTE 12/12 EF 60-65%, G2DD, PASP 50  Resolved Hospital Problem List:    Assessment & Plan:   Bilateral subdural hematoma in setting of severe thrombocytopenia and recent Eliquis use On home Eliquis, took naproxen for headache. Received Andexxa and Plt transfusion on admission. CT Head 12/10 with stable SDH, mental status improving. - Continue to hold antiplatelet/AC - Frequent neuro checks - NSGY following, will reach back out for further recs given improvement in plts  - Neuroprotective measures:  HOB > 30 degrees, normoglycemia, normothermia, electrolytes WNL  Acute encephalopathy vs agitated delirium - ?if related to decadron vs above - has been getting more confused at night reportedly - delirium precautions - wean precedex as tolerated  - serial neuro exams   Addendum 1025> patient remains non verbal after precedex off.  Still MAE.  Will proceed with stat CTH.  Also, remains in Afib with RVR with rates into 150's> will start amio bolus followed by infusion.  NPO for now.  SSI/ CBG adjusted.  Wife at bedside and aware of plan.    Thrombocytopenia Plt < 5 on admission. Smear neg schistocytes however Ddx includes immunotherapy delayed ITP vs malignancy induced ITP. Low suspicion for sepsis. Received 7U Plt in total 12/9-12/11. 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 Followed by Dr. [name]. Previously on ipilimumab and nivolumab - Ipilimumab and nivolumab  held per Oncology (12/03/2021) - f/u outpt per Oncology   Epistaxis Likely in the setting of severe thrombocytopenia. Seen by ENT 12/8. - Not a candidate for cauterization - No further epistaxis s/p Plt/Andexxa - Continue to monitor for active bleeding> no further evidence of bleeding - F/u with ENT as outpatient  Obstructive lung disease Emphysema Pulmonary fibrosis Pulmonary edema  Previous CT scan findings of multiple nodules. - Further outpatient workup as clinically appropriate - Will require outpatient pulm follow up - Supplemental O2 for O2 sat 88-92% - Pulmonary hygiene - Bronchodilators PRN - Intermittent CXR - ideally, would like to give additional lasix today however BP soft after starting  precedex  Paroxysmal atrial fibrillation/SSS s/p PM HTN, HLD On Eliquis for PAF. Also history of SSS with pacemaker in situ. - Hold AC in the setting of bilateral SDH - Cardiac monitoring, been in afib this morning since agitated/ increasing O2 needs - given BP soft, if rates remain high, may need to consider amio.  Does not appear to be on any rate controlling meds at home - Will require significant discussion with Cards, NSGY re: AC resumption risk vs. benefit - Optimize electrolytes for K > 4, Mg > 2 - echo as above - amiodarone gtt as above  CKD stage IIIB - Trend BMP, sCr remains stable - diuresis as tolerated above - Replete electrolytes as indicated - Monitor I&Os - Avoid nephrotoxic agents as able - Ensure adequate renal perfusion   Best Practice: (right click and "Reselect all SmartList Selections" daily)   Diet/type: Regular consistency (see orders) DVT prophylaxis: SCD GI prophylaxis: PPI Lines: N/A Foley:  N/A Code Status:  full code Last date of multidisciplinary goals of care discussion [Patient/wife updated at bedside 12/11]  No family at bedside   Critical care time:   The patient is critically ill with multiple organ system failure and requires high complexity decision making for assessment and support, frequent evaluation and titration of therapies, advanced monitoring, review of radiographic studies and interpretation of complex data.   Critical Care Time devoted to patient care services, exclusive of separately billable procedures, described in this note is 34 minutes.   Kennieth Rad, Edmonia Caprio [facility] 02/10/2022, 7:48 AM  See Amion for pager If no response to pager, please call PCCM consult pager After 7:00 pm call Elink

## 2022-02-10 NOTE — Progress Notes (Signed)
Physical Therapy Treatment Patient Details Name: [name] MRN: [id number] DOB: 1940/09/18 Today's Date: 02/10/2022   History of Present Illness Frank [name] is a 81 y.o. man admitted 02/06/22 with epistaxis, severe HA. CT revealed bilateral mixed density SDH. 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    PT Comments    PT session is limited by pt agitation. Pt with soiled sheets upon PT arrival. Pt requires assistance to ascend into sitting from supine, resistant to mobility intermittently during session. Pt stands without much assistance and later impulsively returns to supine after sheet change completed. Pt will benefit from further mobilization as he is agreeable. PT continues to recommend HHPT at this time pending further progression of mobility tolerance and balance.    Recommendations for follow up therapy are one component of a multi-disciplinary discharge planning process, led by the attending physician.  Recommendations may be updated based on patient status, additional functional criteria and insurance authorization.  Follow Up Recommendations  Home health PT     Assistance Recommended at Discharge Frequent or constant Supervision/Assistance  Patient can return home with the following A lot of help with walking and/or transfers;A lot of help with bathing/dressing/bathroom;Assistance with cooking/housework;Assistance with feeding;Direct supervision/assist for medications management;Direct supervision/assist for financial management;Assist for transportation;Help with stairs or ramp for entrance   Equipment Recommendations   (owns necessary DME)    Recommendations for Other Services        Precautions / Restrictions Precautions Precautions: Fall Precaution Comments: watch O2 Restrictions Weight Bearing Restrictions: No     Mobility  Bed Mobility Overal bed mobility: Needs Assistance Bed Mobility: Supine to Sit, Sit to Supine     Supine to sit: Mod assist, HOB elevated Sit to supine: Min guard   General bed mobility comments: pt with some resistance to supine to sit, impulsively returns to supine after change of sheets    Transfers Overall transfer level: Needs assistance Equipment used: 1 person hand held assist Transfers: Sit to/from Stand Sit to Stand: Min assist                Ambulation/Gait Ambulation/Gait assistance:  (deferred 2/2 pt agitation)                 Stairs             Wheelchair Mobility    Modified Rankin (Stroke Patients Only) Modified Rankin (Stroke Patients Only) Pre-Morbid Rankin Score: Slight disability Modified Rankin: Moderately severe disability     Balance Overall balance assessment: Needs assistance Sitting-balance support: No upper extremity supported, Feet supported Sitting balance-Leahy Scale: Fair Sitting balance - Comments: minG   Standing balance support: Single extremity supported Standing balance-Leahy Scale: Poor Standing balance comment: minG-minA for static standing                            Cognition Arousal/Alertness: Awake/alert Behavior During Therapy: Impulsive, Agitated Overall Cognitive Status: Difficult to assess Area of Impairment: Attention, Following commands, Safety/judgement, Awareness, Problem solving                   Current Attention Level: Sustained   Following Commands: Follows one step commands inconsistently Safety/Judgement: Decreased awareness of safety, Decreased awareness of deficits Awareness: Intellectual Problem Solving: Difficulty sequencing, Requires verbal  cues          Exercises      General Comments General  comments (skin integrity, edema, etc.): bruising noted to BUEs and thigh, pt on 3L [place], VSS when mobilizing      Pertinent Vitals/Pain Pain Assessment Pain Assessment: Faces Faces Pain Scale: Hurts little more Pain Location: generalized Pain Descriptors / Indicators: Grimacing Pain Intervention(s): Monitored during session    Home Living                          Prior Function            PT Goals (current goals can now be found in the care plan section) Acute Rehab PT Goals Patient Stated Goal: home Progress towards PT goals: Not progressing toward goals - comment (limited by agitation)    Frequency    Min 3X/week      PT Plan Current plan remains appropriate    Co-evaluation              AM-PAC PT "6 Clicks" Mobility   Outcome Measure  Help needed turning from your back to your side while in a flat bed without using bedrails?: A Little Help needed moving from lying on your back to sitting on the side of a flat bed without using bedrails?: A Lot Help needed moving to and from a bed to a chair (including a wheelchair)?: A Little Help needed standing up from a chair using your arms (e.g., wheelchair or bedside chair)?: A Little Help needed to walk in hospital room?: Total Help needed climbing 3-5 steps with a railing? : Total 6 Click Score: 13    End of Session Equipment Utilized During Treatment: Oxygen Activity Tolerance: Treatment limited secondary to agitation Patient left: in bed;with call bell/phone within reach;with bed alarm set;with family/visitor present Nurse Communication: Mobility status PT Visit Diagnosis: Unsteadiness on feet (R26.81);Difficulty in walking, not elsewhere classified (R26.2)     Time: 1420-1440 PT Time Calculation (min) (ACUTE ONLY): 20 min  Charges:  $Therapeutic Activity: 8-22 mins                    Frank [name], PT, DPT Acute Rehabilitation Office Frank [name] 02/10/2022, 2:59 PM

## 2022-02-10 NOTE — Inpatient Diabetes Management (Addendum)
Inpatient Diabetes Program Recommendations  AACE/ADA: New Consensus Statement on Inpatient Glycemic Control (2015)  Target Ranges:  Prepandial:   less than 140 mg/dL      Peak postprandial:   less than 180 mg/dL (1-2 hours)      Critically ill patients:  140 - 180 mg/dL   Lab Results  Component Value Date   GLUCAP 120 (H) 02/10/2022   HGBA1C 5.9 01/19/2021    Review of Glycemic Control  Diabetes history: NO Outpatient Diabetes medications: NA Current orders for Inpatient glycemic control: Novolog 0-6 units TID; Decadron 40 mg daily  Inpatient Diabetes Program Recommendations:    Noted order changes to Q4H given NPO status. In agreement. Follow.  Thanks, Bronson Curb, MSN, RNC-OB Diabetes Coordinator [phone] (8a-5p)

## 2022-02-10 NOTE — Progress Notes (Addendum)
[place] Progress Note Patient Name: [name] DOB: 23-Jun-1940 MRN: [id number]   Date of Service  02/10/2022  HPI/Events of Note  Nursing reports that patient coughed up 2 medium sized blood clots. Sat = 99% and RR = 20.  eICU Interventions  Plan: CBC with platelets, PT/INR and PTT STAT.     Intervention Category Major Interventions: Other:  Lysle Dingwall 02/10/2022, 11:22 PM

## 2022-02-10 NOTE — Progress Notes (Signed)
RT called to Pt room due to Pt O2 sats being in the 80's on 6L [place]. RT placed Pt on a HFNC (salter) O2 sats currently 95%.

## 2022-02-10 NOTE — Progress Notes (Signed)
[place] Progress Note Patient Name: [name] DOB: 1941-02-03 MRN: [id number]   Date of Service  02/10/2022  HPI/Events of Note  Hypophosphatemia - PO4--- = 2.0, K+ = 3.8 and Creatinine = 0.78.  eICU Interventions  Will replace PO4---.     Intervention Category Major Interventions: Electrolyte abnormality - evaluation and management  Mikaya Bunner [place] 02/10/2022, 12:14 AM

## 2022-02-10 NOTE — Progress Notes (Signed)
Events noted overnight.  Laboratory data reviewed.  His platelet count was 104 without any active bleeding.  He does have agitation and required restraints and episode of hypoxia.  At this time, no further intervention is needed regarding his thrombocytopenia.  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  Dexamethasone can be discontinued after today no additional transfusion is needed.  Other issues are managed by the primary team.  Please call with any questions regarding this patient.

## 2022-02-11 DIAGNOSIS — E43 Unspecified severe protein-calorie malnutrition: Secondary | ICD-10-CM | POA: Insufficient documentation

## 2022-02-11 LAB — PROTIME-INR
INR: 1.4 — ABNORMAL HIGH (ref 0.8–1.2)
Prothrombin Time: 17 seconds — ABNORMAL HIGH (ref 11.4–15.2)

## 2022-02-11 LAB — CBC WITH DIFFERENTIAL/PLATELET
Abs Immature Granulocytes: 0.15 10*3/uL — ABNORMAL HIGH (ref 0.00–0.07)
Basophils Absolute: 0 10*3/uL (ref 0.0–0.1)
Basophils Relative: 0 %
Eosinophils Absolute: 0 10*3/uL (ref 0.0–0.5)
Eosinophils Relative: 0 %
HCT: 27.6 % — ABNORMAL LOW (ref 39.0–52.0)
Hemoglobin: 10 g/dL — ABNORMAL LOW (ref 13.0–17.0)
Immature Granulocytes: 1 %
Lymphocytes Relative: 4 %
Lymphs Abs: 0.5 10*3/uL — ABNORMAL LOW (ref 0.7–4.0)
MCH: 33.7 pg (ref 26.0–34.0)
MCHC: 36.2 g/dL — ABNORMAL HIGH (ref 30.0–36.0)
MCV: 92.9 fL (ref 80.0–100.0)
Monocytes Absolute: 0.5 10*3/uL (ref 0.1–1.0)
Monocytes Relative: 5 %
Neutro Abs: 9.5 10*3/uL — ABNORMAL HIGH (ref 1.7–7.7)
Neutrophils Relative %: 90 %
Platelets: 124 10*3/uL — ABNORMAL LOW (ref 150–400)
RBC: 2.97 MIL/uL — ABNORMAL LOW (ref 4.22–5.81)
RDW: 14.3 % (ref 11.5–15.5)
WBC: 10.6 10*3/uL — ABNORMAL HIGH (ref 4.0–10.5)
nRBC: 0.8 % — ABNORMAL HIGH (ref 0.0–0.2)

## 2022-02-11 LAB — CBC
HCT: 26.7 % — ABNORMAL LOW (ref 39.0–52.0)
Hemoglobin: 9.6 g/dL — ABNORMAL LOW (ref 13.0–17.0)
MCH: 33.4 pg (ref 26.0–34.0)
MCHC: 36 g/dL (ref 30.0–36.0)
MCV: 93 fL (ref 80.0–100.0)
Platelets: 106 10*3/uL — ABNORMAL LOW (ref 150–400)
RBC: 2.87 MIL/uL — ABNORMAL LOW (ref 4.22–5.81)
RDW: 14.2 % (ref 11.5–15.5)
WBC: 8 10*3/uL (ref 4.0–10.5)
nRBC: 0.6 % — ABNORMAL HIGH (ref 0.0–0.2)

## 2022-02-11 LAB — RENAL FUNCTION PANEL
Albumin: 2.5 g/dL — ABNORMAL LOW (ref 3.5–5.0)
Anion gap: 9 (ref 5–15)
BUN: 23 mg/dL (ref 8–23)
CO2: 20 mmol/L — ABNORMAL LOW (ref 22–32)
Calcium: 7.3 mg/dL — ABNORMAL LOW (ref 8.9–10.3)
Chloride: 103 mmol/L (ref 98–111)
Creatinine, Ser: 0.76 mg/dL (ref 0.61–1.24)
GFR, Estimated: >60 mL/min (ref >60)
Glucose, Bld: 185 mg/dL — ABNORMAL HIGH (ref 70–99)
Phosphorus: 3.4 mg/dL (ref 2.5–4.6)
Potassium: 3.7 mmol/L (ref 3.5–5.1)
Sodium: 132 mmol/L — ABNORMAL LOW (ref 135–145)

## 2022-02-11 LAB — GLUCOSE, CAPILLARY
Glucose-Capillary: 125 mg/dL — ABNORMAL HIGH (ref 70–99)
Glucose-Capillary: 130 mg/dL — ABNORMAL HIGH (ref 70–99)
Glucose-Capillary: 145 mg/dL — ABNORMAL HIGH (ref 70–99)
Glucose-Capillary: 162 mg/dL — ABNORMAL HIGH (ref 70–99)
Glucose-Capillary: 171 mg/dL — ABNORMAL HIGH (ref 70–99)
Glucose-Capillary: 178 mg/dL — ABNORMAL HIGH (ref 70–99)

## 2022-02-11 LAB — APTT: aPTT: 31 seconds (ref 24–36)

## 2022-02-11 LAB — MAGNESIUM: Magnesium: 2.1 mg/dL (ref 1.7–2.4)

## 2022-02-11 LAB — STREP PNEUMONIAE URINARY ANTIGEN: Strep Pneumo Urinary Antigen: NEGATIVE

## 2022-02-11 MED ORDER — FUROSEMIDE 10 MG/ML IJ SOLN
20.0000 mg | Freq: Once | INTRAMUSCULAR | Status: AC
  Administered 2022-02-11: 20 mg via INTRAVENOUS
  Filled 2022-02-11: qty 2

## 2022-02-11 MED ORDER — POTASSIUM CHLORIDE 10 MEQ/100ML IV SOLN: 10.0000 meq | INTRAVENOUS | Status: DC

## 2022-02-11 MED ORDER — POTASSIUM CHLORIDE 20 MEQ PO PACK
40.0000 meq | PACK | Freq: Once | ORAL | Status: AC
  Administered 2022-02-11: 40 meq via ORAL
  Filled 2022-02-11: qty 2

## 2022-02-11 MED ORDER — SODIUM CHLORIDE 0.9 % IV SOLN: 2.0000 g | INTRAVENOUS | Status: DC

## 2022-02-11 MED ORDER — SODIUM CHLORIDE 0.9 % IV SOLN
2.0000 g | Freq: Three times a day (TID) | INTRAVENOUS | Status: DC
  Administered 2022-02-11 – 2022-02-13 (×6): 2 g via INTRAVENOUS
  Filled 2022-02-11 (×6): qty 12.5

## 2022-02-11 MED ORDER — POTASSIUM CHLORIDE CRYS ER 20 MEQ PO TBCR: 40.0000 meq | EXTENDED_RELEASE_TABLET | Freq: Once | ORAL | Status: DC

## 2022-02-11 MED ORDER — LACTATED RINGERS IV BOLUS
500.0000 mL | Freq: Once | INTRAVENOUS | Status: AC
  Administered 2022-02-11: 500 mL via INTRAVENOUS

## 2022-02-11 MED ORDER — SODIUM CHLORIDE 0.9% IV SOLUTION: Freq: Once | INTRAVENOUS | Status: DC

## 2022-02-11 MED ORDER — SALINE SPRAY 0.65 % NA SOLN
1.0000 | NASAL | Status: DC | PRN
  Filled 2022-02-11: qty 44

## 2022-02-11 NOTE — Progress Notes (Signed)
[place] Progress Note Patient Name: [name] DOB: Dec 26, 1940 MRN: [id number]   Date of Service  02/11/2022  HPI/Events of Note  INR = 1.4.  eICU Interventions  Plan: Transfuse 2 units FFP now. Repeat PT/INR at 12 noon.     Intervention Category Major Interventions: Other:  Corrion Stirewalt Cornelia Copa 02/11/2022, 7:00 AM

## 2022-02-11 NOTE — Progress Notes (Addendum)
Called to reassess patient as patient more agitated, restless and shivering with tachycardia, SpO2 92% on 2.5L and tachypneic.  BP stable.  Awake but currently does not have hearing aids in but able to follow some commands with rigors.   Blood pressure 135/63, pulse 101, temperature 97.9 F (36.6 C), temperature source Oral, resp. rate (!) 29, height 5\' 5"  (1.651 m), weight 76.1 kg, SpO2 92 %.  Ongoing RLL fine crackles.  CXR yest with ddx pulmonary edema vs multifocal vs aspiration PNA.  WBC had improved, afebrile, and no respiratory distress and stable O2 this morning.  S/p lasix 20mg  this am.    Plan:  Suspect he is trying to mount a fever.  Will go ahead and treat for PNA with cefepime for now given day 5 of hospitalization.  MRSA PCR neg.  Send urine strep and legionella.  Tylenol prn fever.  Continue O2 for goal sat 88-94%. CXR in am.   Hold transfer to tele.  Will consider transfer to PCU later.       Kennieth Rad, Edmonia Caprio [facility] 02/11/2022, 3:12 PM  See Amion for pager If no response to pager, please call PCCM consult pager After 7:00 pm call Elink

## 2022-02-11 NOTE — Progress Notes (Signed)
Pharmacy Electrolyte Replacement  Recent Labs:  Recent Labs    02/11/22 0426  K 3.7  MG 2.1  PHOS 3.4  CREATININE 0.76    Low Critical Values (K </= 2.5, Phos </= 1, Mg </= 1) Present: None  Plan: Replete with 77mEq IV Kcl, patient NPO. Recheck in AM.   Esmeralda Arthur, PharmD, BCCCP

## 2022-02-11 NOTE — Progress Notes (Signed)
Initial Nutrition Assessment  DOCUMENTATION CODES:   Severe malnutrition in context of chronic illness  INTERVENTION:   Ensure Enlive po BID, each supplement provides 350 kcal and 20 grams of protein.  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  GOAL:   Patient will meet greater than or equal to 90% of their needs  MONITOR:   PO intake, Supplement acceptance  REASON FOR ASSESSMENT:   Consult Assessment of nutrition requirement/status  ASSESSMENT:   Pt with PMH esophageal stricture, essential hypertension, GERD, hiatal hernia, hearing loss, HLD, iron deficiency anemia, PAF, stage 3 CKD, and recent epistaxis,  thrombocytopenia and stage IV renal cell ca with pulmonary/pancreatic involvement dx 2023 s/p L radical nephrectomy 09/2018 admitted with bilateral SDH .   Pt discussed during ICU rounds and with RN.  Neurosurgery consulting for possible MMA embolization. Agitation better today. Per RN pt NPO yesterday 12/13 due to agitation.    Pt awake and alert today today. Wife at bedside. Both provide history. They report that in the last 4 months 8% weight loss. His usual weight was 190 lb at the beginning of 2023 and lost slowly down to 182 lb in 09/2021. He lost an additional 8% in the last 4 months.  Wife reports poor appetite. They usually eat 3 meals but pt eats very small portions.  Per wife pt is a very picky eater. Wife helped pt to order lunch today, he did not get breakfast this am.   Breakfast: grits vs cereal Lunch: 1/2 grilled cheese and soup Dinner: 1/2 chicken tender and rice Per pt and his wife they met with RD at the cancer center.   12/9 - admitted  with bilateral SDH, severe thrombocytopenia 12/13 - worsening encephalopathy on precedex    Medications reviewed and include: SSI, protonix  NS @ 10 ml/hr   Labs reviewed: Na 132 CBG's: 145-266  NUTRITION - FOCUSED PHYSICAL EXAM:  Flowsheet Row Most Recent Value  Orbital Region Moderate depletion  Upper Arm Region Mild depletion  Thoracic and Lumbar Region Mild depletion  Buccal Region Moderate depletion  Temple Region Moderate depletion  Clavicle Bone Region Severe depletion  Clavicle and Acromion Bone Region Severe depletion  Scapular Bone Region Moderate depletion  Dorsal Hand No depletion  Patellar Region Severe depletion  Anterior Thigh Region Severe depletion  Posterior Calf Region Severe depletion  Edema (RD Assessment) None  Hair Reviewed  Eyes Reviewed  Mouth Reviewed  Skin Reviewed  Nails Reviewed       Diet Order:   Diet Order             Diet regular Room service appropriate? Yes; Fluid consistency: Thin  Diet effective now                   EDUCATION NEEDS:   Education needs have been addressed  Skin:  Skin Assessment: Reviewed RN Assessment  Last BM:  12/13 - large; type 4  Height:   Ht Readings from Last 1 Encounters:  02/06/22 _0  (1.651 m)    Weight:   Wt Readings from Last 1 Encounters:  02/11/22 76.1 kg   BMI:  Body mass index is 27.92 kg/m.  Estimated Nutritional Needs:   Kcal:  1900-2100  Protein:  105-125 grams  Fluid:  >1.9 L/day  Lockie Pares., RD, LDN, CNSC See AMiON for contact information

## 2022-02-11 NOTE — Progress Notes (Signed)
  Transition of Care [facility]) Screening Note   Patient Details  Name: [name] Date of Birth: 1940/11/30   Transition of Care [facility]) CM/SW Contact:    Benard Halsted, LCSW Phone Number: 02/11/2022, 10:12 AM    Transition of Care Department [facility]) has reviewed patient and home health therapies have been recommended at discharge. We will continue to monitor patient advancement through interdisciplinary progression rounds. If new patient transition needs arise, please place a TOC consult.

## 2022-02-11 NOTE — Progress Notes (Addendum)
NAME:  Frank Witt., MRN:  [id number], DOB:  09-Mar-1940, LOS: 5 ADMISSION DATE:  02/06/2022, CONSULTATION DATE: 02/06/2022 REFERRING MD: Roderic Palau - EDP CHIEF COMPLAINT: Subdural hematoma  History of Present Illness:  81 year old man who presented to [facility] ED 12/9 with complaints of feeling generally unwell and headache x 1 week. Recently had his COVID booster shot about a week PTA. Soon after noted increased bruising on his arms and headache a day after his COVID vaccine; also with c/o nausea and epistaxis (seen by ENT). PMHx significant for HTN, HLD, PAF (on Eliquis), sick sinus syndrome (pacemaker in situ), AAA, CKD stage IIIb, RCC (s/p L radical nephrectomy 2020, lung/pancreatic involvement), BPH, GERD, diverticulosis.  On ED arrival, CT Head demonstrated bilateral SDH without significant mass effect or midline shift. CXR showed new hazy opacity at the R lung base. Labs were notable for Hgb 12.0, Plt < 5. Na 127, K 3.5, CO2 20, renal function WNL. Platelets and Andexxa were administered. NSGY consulted.  PCCM consulted and patient transferred to [facility] for ICU admission.  Pertinent Medical History:   Past Medical History:  Diagnosis Date   Abdominal aortic aneurysm (AAA) without rupture 06/23/2018   Age-related osteoporosis without current pathological fracture 07/14/2021   BPH (benign prostatic hyperplasia) 01/19/2010   Cervical radiculopathy 06/16/2018   Chronic anticoagulation 08/22/2018   Chronic bilateral low back pain without sciatica 07/18/2019   Chronic sinusitis 05/18/2021   Diverticulosis    Erectile dysfunction due to arterial insufficiency 04/23/2015   Esophageal stricture    Essential hypertension 05/01/2018   Estimated Creatinine Clearance: 44.1 mL/min (by C-G formula based on SCr of 1.35 mg/dL).   GERD (gastroesophageal reflux disease) 11/10/2010   Hearing loss    Hiatal hernia    Hyperlipidemia with target LDL less than 70 11/28/2012   The 10-year ASCVD risk score  Mikey Bussing DC Jr., et al., 2013) is: 30.7%   Values used to calculate the score:     Age: 25 years     Sex: Male     Is Non-Hispanic African American: No     Diabetic: No     Tobacco smoker: No     Systolic Blood Pressure: 409 mmHg     Is BP treated: No     HDL Cholesterol: 30.8 mg/dL     Total Cholesterol: 183 mg/dL   Hypoglycemia    Iron deficiency anemia due to dietary causes 12/07/2018   Lung nodule seen on imaging study 06/23/2018   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   Estimated Creatinine Clearance: 51.7 mL/min (by C-G formula based on SCr of 1.15 mg/dL).   Syncope    presented with atrial fib converted with Diltiazem   Systemic inflammatory response syndrome 06/26/2018   Thrombocytopenia 01/19/2021   Significant Hospital Events: Including procedures, antibiotic start and stop dates in addition to other pertinent events  12/9 Presented to [facility] ED for headache, increased bruising. CT Head with bilateral SDH, severe thrombocytopenia < 5. 2U Plt transfused. 12/10 Head CT showing bilateral subdural hematomas ~22mm in thickness, no evidence of shift or other evidence of mass effect. Heme consulted for thrombocytopenia, IVIG/dex given. Additional 5U Plt given. 12/11 Plt improved to 26. VSS. More interactive/alert. IVIG dose #2, continue dex. 12/12 Plt 73 (26),  post-IVIG/dex. Improved mental status. Ongoing O2 needs. CXR with increased bilateral airspace disease, L > R small effusions. Lasix given. 12/13 worsening encephalopathy on precedex> agitated, mostly non-verbal, repeat CTH stable.  Amio for afib w/RVR  Interim History / Subjective:  Mental status continues to improve, following commands but ongoing aphasia.  Off precedex since yesterday and not requiring posey or mittens.   Patient coughing up dime sized blood clots overnight, INR 1.3> 1.4, 2 units FFP ordered per Elink MD.  Placed on mask overnight for mouth breathing  Patient has no complaints this morning other than dry mouth  Objective:  Blood pressure 123/71, pulse 60, temperature (!) 97.5 F (36.4 C), temperature source Axillary, resp. rate 19, height 5\' 5"  (1.651 m), weight 76.1 kg, SpO2 98 %.        Intake/Output Summary (Last 24 hours) at 02/11/2022 0748 Last data filed at 02/11/2022 0700 Gross per 24 hour  Intake 1116.66 ml  Output 2250 ml  Net -1133.34 ml   Filed Weights   02/09/22 0500 02/10/22 0421 02/11/22 0409  Weight: 80.1 kg 80 kg 76.1 kg   Physical Examination: General:  frail and chronically ill appearing male lying in bed in NAD HEENT: MM pink/dry, dried blood to , pupils 4/reactive, anicertic, no dried blood noted in nares Neuro: easily awakens to verbal/ light touch without hearing aids in.  [facility].  Then oriented to person, place, month/ year, MAE, generalized weakness, speech is clear CV: rr, NSR 60s, pacemaker left upper chest, no murmur PULM:  non labored, clear anteriorly, slight right posterior basilar rales GI: soft, bs+, NT/ ND, condom cath Extremities: warm/dry, trace LE edema  Skin: few scattered petechiae on LE, scattered ecchymosis to BUE  Afebrile UOP 2.2L +1 unmeasured L/ 24hrs Net -3L (but some inaccuracy given unmeasured urinary occurrences) BM overnight> smear  CBG> 221> 145 Labs> Na 127>132, K 3.7, sCr 0.76, phos 3.4, mag 2.1, WBC 10.6>  8, Hgb 10.3> 10> 9.6, plts 104> 124> 106  TTE 12/12 EF 60-65%, G2DD, PASP 50 CTH 12/13> stable, no progression of bilateral SDH  Resolved Hospital Problem List:    Assessment & Plan:   Bilateral subdural hematoma in setting of severe thrombocytopenia and recent Eliquis use On home Eliquis, took naproxen for headache. Received Andexxa and Plt transfusion on admission. CT Head 12/10 with stable SDH, worsening mental status/ agitated delirium overnight 12/12 on precedex, off by 12/13 - NSGY following, appreciate recs, discussing if candidate for MMA embolization - hold AC/ antiplatelets  - serial neuro exams - Neuroprotective measures: HOB > 30 degrees, normoglycemia, normothermia, electrolytes WNL - if remains stable, will consider transfer out of ICU later today   Agitated delirium, resolved - possibly related to high dose decadron for 4 days, stopped 12/13 - patient appears back to baseline 12/14 - delirium precautions  Thrombocytopenia Plt < 5 on admission. Smear neg schistocytes however Ddx includes immunotherapy delayed ITP vs malignancy induced ITP. Low suspicion for sepsis. Received 7U Plt in total 12/9-12/11 without improvement.  Since responded to IVIG x2 (last dose 12/11) and dexamethasone (40mg  x 4 days,  completed 12/13) - Hematology following, no further interventions needed at this time - Trend Plt, remain stable.  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 Followed by Dr. [name]. Previously on ipilimumab and nivolumab - Ipilimumab and nivolumab held per Oncology (12/03/2021) - f/u outpt per Oncology  - consider PMT consult, will d/w Dr. [name]  Epistaxis Likely in the setting of severe thrombocytopenia. Seen by ENT 12/8. - Not a candidate for cauterization - No further epistaxis s/p Plt/Andexxa P: - coughing up few clots overnight-  no bleeding from nares.  Could be posterior epistaxis draining posteriorly vs AoC xerostomia as he has been mouth breathing and dried blood noted to palate.  Patient denies picking his nose> which is what precipitated his epistaxis per ENT notes/ while on DOAC - see below - F/u with ENT as outpatient - hold FFP for now as no more blood clots.  INR around baseline 1.3-1.4.  If further bleeding can consider - trend CBC, transfuse < 7  Obstructive lung disease Emphysema Pulmonary fibrosis Pulmonary edema  Previous CT scan findings of multiple nodules. - Further outpatient workup as clinically appropriate - Will require outpatient pulm follow up - Supplemental O2 for O2 sat 88-92%> WITH humidity  - Pulmonary hygiene - Bronchodilators PRN - Intermittent CXR - lasix and KCL today if BP allows   Paroxysmal atrial fibrillation/SSS s/p PM HTN, HLD On Eliquis for PAF. Also history of SSS with pacemaker in situ. TTE 12/12 EF 60-65%, G2DD, PASP 50 - continue to hold [facility] in the setting of bilateral SDH - Amio started 12/13 with conversion back to SR with one episode of Afib/ RVR in the afternoon. Not on any rate control at home.  Likely related to his critical illness.  His delirium is resolved today and overall doing much better.  Will stop and monitor tele.  If more episodes of PAF with RVR, consider adding longterm rate control med - Will require significant discussion with Cards, NSGY re: AC resumption risk vs. benefit - Optimize electrolytes for K > 4, Mg > 2  CKD stage IIIB - stable  - strict I/Os, trend renal panel, avoid nephrotoxins    Protein calorie malnutrition - RD consult - resume diet after bedside swallow   Best Practice: (right click and "Reselect all SmartList Selections" daily)   Diet/type: Regular consistency (see orders) DVT prophylaxis: SCD GI prophylaxis: PPI Lines: N/A Foley:  N/A Code Status:  full code Last date of multidisciplinary goals of care discussion  [Patient/wife updated at bedside 12/11]  Wife updated 12/13.  Pending update 12/13.  Critical care time:   The patient is critically ill with multiple organ system failure and requires high complexity decision making for assessment and support, frequent evaluation and titration of therapies, advanced monitoring, review of radiographic studies and interpretation of complex data.   Critical Care Time devoted to patient care services, exclusive of separately billable procedures, described in this note is 32 minutes.   Kennieth Rad, Edmonia Caprio [facility] 02/11/2022, 7:48 AM  See Amion for pager If no response to pager, please call PCCM consult pager After 7:00 pm call Elink

## 2022-02-11 NOTE — Progress Notes (Signed)
Pharmacy Antibiotic Note  [name] is a 81 y.o. male admitted on 02/06/2022 with pneumonia.  Pharmacy has been consulted for cefepime dosing.  CXR with possible PNA. Empiric cefepime started. Sending urine strep and legionella. AF so far.  Scr <1  Plan: Cefepime 2g IV q8 F/u cultures  Height: 5\' 5"  (165.1 cm) Weight: 76.1 kg (167 lb 12.3 oz) IBW/kg (Calculated) : 61.5  Temp (24hrs), Avg:97.8 F (36.6 C), Min:97.1 F (36.2 C), Max:98.6 F (37 C)  Recent Labs  Lab 02/08/22 0534 02/09/22 0323 02/09/22 1144 02/10/22 0424 02/10/22 1644 02/11/22 0015 02/11/22 0426  WBC 7.9 11.1*  --  7.3  --  10.6* 8.0  CREATININE 0.80 0.72 0.78 0.85 0.95  --  0.76    Estimated Creatinine Clearance: 70.1 mL/min (by C-G formula based on SCr of 0.76 mg/dL).    Allergies  Allergen Reactions   Hydrocodone Other (See Comments)    Caused confusion    Monosodium Glutamate Other (See Comments)    Caused headaches   Naproxen Sodium     Mouth blisters with prolonged use.  Left nephrectomy 2020   Other     Perfume - headaches     Antimicrobials this admission: 12/14 cefepime>>  Dose adjustments this admission:   Microbiology results: MRSA neg 12/14 urinary strep/legionella>>  Onnie Boer, PharmD, BCIDP, AAHIVP, CPP Infectious Disease Pharmacist 02/11/2022 3:25 PM

## 2022-02-12 ENCOUNTER — Inpatient Hospital Stay (HOSPITAL_COMMUNITY): Payer: Medicare Other

## 2022-02-12 DIAGNOSIS — E43 Unspecified severe protein-calorie malnutrition: Secondary | ICD-10-CM

## 2022-02-12 DIAGNOSIS — Z515 Encounter for palliative care: Secondary | ICD-10-CM

## 2022-02-12 LAB — RENAL FUNCTION PANEL
Albumin: 2.3 g/dL — ABNORMAL LOW (ref 3.5–5.0)
Anion gap: 7 (ref 5–15)
BUN: 26 mg/dL — ABNORMAL HIGH (ref 8–23)
CO2: 21 mmol/L — ABNORMAL LOW (ref 22–32)
Calcium: 7.3 mg/dL — ABNORMAL LOW (ref 8.9–10.3)
Chloride: 104 mmol/L (ref 98–111)
Creatinine, Ser: 0.89 mg/dL (ref 0.61–1.24)
GFR, Estimated: >60 mL/min (ref >60)
Glucose, Bld: 129 mg/dL — ABNORMAL HIGH (ref 70–99)
Phosphorus: 2.4 mg/dL — ABNORMAL LOW (ref 2.5–4.6)
Potassium: 3.9 mmol/L (ref 3.5–5.1)
Sodium: 132 mmol/L — ABNORMAL LOW (ref 135–145)

## 2022-02-12 LAB — GLUCOSE, CAPILLARY
Glucose-Capillary: 126 mg/dL — ABNORMAL HIGH (ref 70–99)
Glucose-Capillary: 100 mg/dL — ABNORMAL HIGH (ref 70–99)
Glucose-Capillary: 196 mg/dL — ABNORMAL HIGH (ref 70–99)
Glucose-Capillary: 225 mg/dL — ABNORMAL HIGH (ref 70–99)
Glucose-Capillary: 202 mg/dL — ABNORMAL HIGH (ref 70–99)

## 2022-02-12 LAB — CBC
HCT: 28.2 % — ABNORMAL LOW (ref 39.0–52.0)
Hemoglobin: 9.8 g/dL — ABNORMAL LOW (ref 13.0–17.0)
MCH: 33.2 pg (ref 26.0–34.0)
MCHC: 34.8 g/dL (ref 30.0–36.0)
MCV: 95.6 fL (ref 80.0–100.0)
Platelets: 74 10*3/uL — ABNORMAL LOW (ref 150–400)
RBC: 2.95 MIL/uL — ABNORMAL LOW (ref 4.22–5.81)
RDW: 14.6 % (ref 11.5–15.5)
WBC: 4.6 10*3/uL (ref 4.0–10.5)
nRBC: 0.9 % — ABNORMAL HIGH (ref 0.0–0.2)

## 2022-02-12 LAB — PROTIME-INR
INR: 1.4 — ABNORMAL HIGH (ref 0.8–1.2)
Prothrombin Time: 16.9 seconds — ABNORMAL HIGH (ref 11.4–15.2)

## 2022-02-12 NOTE — Care Management Important Message (Signed)
Important Message  Patient Details  Name: [name] MRN: [id number] Date of Birth: 09-24-1940   Medicare Important Message Given:  Yes     [name] 02/12/2022, 2:05 PM

## 2022-02-12 NOTE — Progress Notes (Signed)
     Referral received for [name] re: goals of care discussion. Chart reviewed and updates received from RN. Patient assessed and is unable to engage independently in discussions. He is sleeping soundly and easily arousable, but quickly returns to sleep. No family at bedside.    I attempted to contact patient's wife Frank [name] via phone. There was no answer and I left a HIPPA appropriate voicemail with PMT contact information.   PMT will re-attempt to contact family at a later time/date. Detailed note and recommendations to follow once GOC has been completed.   Thank you for your referral and allowing PMT to assist in Mr. Frank Moll Jr.'s care.   Jordan Hawks, FNP-BC Palliative Medicine Team  Phone: [phone]  NO CHARGE

## 2022-02-12 NOTE — Progress Notes (Signed)
Physical Therapy Treatment Patient Details Name: [name] MRN: [id number] DOB: 1940-04-20 Today's Date: 02/12/2022   History of Present Illness Frank [name] is a 81 y.o. man admitted 02/06/22 with epistaxis, severe HA. CT revealed bilateral mixed density SDH. 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    PT Comments    Pt greeted supine in bed and agreeable to session with good progress towards acute goals this date. Pt grossly min guard for all mobility with pt needing min assist during gait with HHA for initial 100' to steady as pt with lateral sway and general unsteadiness noted with pt also reaching for rail in hall intermittently. Pt progressing to min guard with increased distance without AD or assist. Pt able to ascend/descend 2 steps in therapy gym with step-over-step pattern with no LOB. Pt continues to be limited by general weakness and impaired balance/postural reaction. Pt continues to benefit from skilled PT services to progress toward functional mobility goals.    Recommendations for follow up therapy are one component of a multi-disciplinary discharge planning process, led by the attending physician.  Recommendations may be updated based on patient status, additional functional criteria and insurance authorization.  Follow Up Recommendations  Home health PT     Assistance Recommended at Discharge Frequent or constant Supervision/Assistance  Patient can return home with the following A lot of help with walking and/or transfers;A lot of help with bathing/dressing/bathroom;Assistance with cooking/housework;Assistance with feeding;Direct supervision/assist for medications  management;Direct supervision/assist for financial management;Assist for transportation;Help with stairs or ramp for entrance   Equipment Recommendations   (owns necessary DME)    Recommendations for Other Services       Precautions / Restrictions Precautions Precautions: Fall Precaution Comments: watch O2 Restrictions Weight Bearing Restrictions: No     Mobility  Bed Mobility Overal bed mobility: Needs Assistance Bed Mobility: Supine to Sit, Sit to Supine     Supine to sit: HOB elevated, Min guard Sit to supine: Min guard   General bed mobility comments: ues of raisl and increased time    Transfers Overall transfer level: Needs assistance Equipment used: 1 person hand held assist, None Transfers: Sit to/from Stand Sit to Stand: Min guard           General transfer comment: min guard for safety able to stand at end of session and side step to Frank [name] without HHA    Ambulation/Gait Ambulation/Gait assistance: Min assist, Min guard (deferred 2/2 pt agitation) Gait Distance (Feet): 200 Feet Assistive device: 1 person hand held assist, None Gait Pattern/deviations: Step-through pattern, Decreased stride length, Trunk flexed Gait velocity: dec     General Gait Details: pt reaching for rail with HHA inititally during first 100' on return to room from gym pt with no HHA, min assist to maintain balance at start   Stairs Stairs: Yes Stairs assistance: Min guard Stair Management: Two rails, Alternating pattern Number of Stairs: 2 General stair comments: up/down without fault, no LOB   Wheelchair Mobility    Modified Rankin (Stroke Patients Only) Modified Rankin (Stroke Patients Only) Pre-Morbid Rankin Score: Slight disability Modified Rankin: Moderately severe disability     Balance Overall balance assessment: Needs assistance Sitting-balance support: No upper extremity supported, Feet supported Sitting balance-Leahy Scale: Good     Standing balance  support: Single extremity supported Standing  balance-Leahy Scale: Fair Standing balance comment: min a- min guard for dynamic tasks                            Cognition Arousal/Alertness: Awake/alert Behavior During Therapy: WFL for tasks assessed/performed Overall Cognitive Status: Within Functional Limits for tasks assessed                                 General Comments: pt following all commands, most difficulty with communication 2/2 HOH, making conversation throghout        Exercises      General Comments General comments (skin integrity, edema, etc.): VSS on RA throughout      Pertinent Vitals/Pain Pain Assessment Pain Assessment: No/denies pain Pain Intervention(s): Monitored during session    Home Living                          Prior Function            PT Goals (current goals can now be found in the care plan section) Acute Rehab PT Goals Patient Stated Goal: to get home PT Goal Formulation: With patient/family Time For Goal Achievement: 02/23/22    Frequency    Min 3X/week      PT Plan Current plan remains appropriate    Co-evaluation              AM-PAC PT "6 Clicks" Mobility   Outcome Measure  Help needed turning from your back to your side while in a flat bed without using bedrails?: A Little Help needed moving from lying on your back to sitting on the side of a flat bed without using bedrails?: A Lot Help needed moving to and from a bed to a chair (including a wheelchair)?: A Little Help needed standing up from a chair using your arms (e.g., wheelchair or bedside chair)?: A Little Help needed to walk in [name] room?: A Little Help needed climbing 3-5 steps with a railing? : A Little 6 Click Score: 17    End of Session Equipment Utilized During Treatment: Gait belt Activity Tolerance: Patient tolerated treatment well Patient left: in bed;with call bell/phone within reach;with bed alarm  set;with family/visitor present Nurse Communication: Mobility status PT Visit Diagnosis: Unsteadiness on feet (R26.81);Difficulty in walking, not elsewhere classified (R26.2)     Time: 1000-1019 PT Time Calculation (min) (ACUTE ONLY): 19 min  Charges:  $Gait Training: 8-22 mins                     Frank [name] R. PTA Acute Rehabilitation Services Office: [place] 02/12/2022, 10:59 AM

## 2022-02-12 NOTE — Progress Notes (Signed)
PROGRESS NOTE    [name]  HDQ:[id number] DOB: 09-07-1940 DOA: 02/06/2022 PCP: [name], MD   Brief Narrative:  Patient is an 81 year old man who presented to [facility] ED 12/9 with complaints of feeling generally unwell and headache x 1 week. Recently had his COVID booster shot about a week PTA. Soon after noted increased bruising on his arms and headache a day after his COVID vaccine; also with c/o nausea and epistaxis (seen by ENT). PMHx significant for HTN, HLD, PAF (on Eliquis), sick sinus syndrome (pacemaker in situ), AAA, CKD stage IIIb, RCC (s/p L radical nephrectomy 2020, lung/pancreatic involvement), BPH, GERD, diverticulosis. PCCM called to admit; CT Head demonstrated bilateral SDH without significant mass effect or midline shift - platelets and Andexxa were administered. NSGY consulted.    12/9 Presented to [facility] ED for headache, increased bruising. CT Head with bilateral SDH, severe thrombocytopenia < 5. 2U Plt transfused. 12/10 Head CT showing bilateral subdural hematomas ~6mm in thickness, no evidence of shift or other evidence of mass effect. Heme consulted for thrombocytopenia, IVIG/dex given. Additional 5U Plt given. 12/11 Plt improved to 26. VSS. More interactive/alert. IVIG dose #2, continue dex. 12/12 Plt 73 (26), post-IVIG/dex. Improved mental status. Ongoing O2 needs. CXR with increased bilateral airspace disease, L > R small effusions. Lasix given. 12/13 worsening encephalopathy on precedex> agitated, mostly non-verbal, repeat CTH stable.  Amio for afib w/RVR 12/14 questionable aspiration - antibiotics initiated    Assessment & Plan:   Principal Problem:   Subdural hematoma (HCC) Active Problems:   Renal cell carcinoma (HCC)   Acute ITP (HCC)   Protein-calorie malnutrition, severe   Bilateral subdural hematoma in setting of severe thrombocytopenia and recent Eliquis use On home Eliquis, took naproxen for headache. Received Andexxa and Plt transfusion on  admission. CT Head 12/10 with stable SDH, worsening mental status/ agitated delirium overnight 12/12 on precedex, off by 12/13 - NSGY following, appreciate recs, discussing if candidate for MMA embolization - hold AC/ antiplatelets    Agitated delirium, resolved - possibly related to high dose decadron for 4 days, stopped 12/13 - patient appears back to baseline 12/14 - delirium precautions   Aspiration pneumonia vs pneumonitis -Likely secondary to delirium above -Resolving on antibiotics o- not hypoxic today with ambulation; no indication for steroids  Thrombocytopenia, severe Plt < 5 on admission. Smear neg schistocytes however Ddx includes immunotherapy delayed ITP vs malignancy induced ITP. Low suspicion for sepsis. Received 7U Plt in total 12/9-12/11 without improvement.  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 Followed by Dr. [name]. Previously on ipilimumab and nivolumab - Ipilimumab and nivolumab held per Oncology (12/03/2021) - f/u outpt per Oncology  - consider PMT consult, will d/w Dr. [name]   Epistaxis, resolved Likely in the setting of severe thrombocytopenia. Seen by ENT 12/8. - Not a candidate for cauterization - No further epistaxis s/p Plt/Andexxa - F/u with ENT as outpatient - hold FFP for now as no more blood clots.  INR around baseline 1.3-1.4.  If further bleeding can consider repeat transfusion   Obstructive lung disease Emphysema Pulmonary fibrosis Pulmonary edema  Previous CT scan findings of multiple nodules. -  Further outpatient workup as clinically appropriate - Will require outpatient  pulm follow up - Supplemental O2 for O2 sat 88-92%> WITH humidity  - Pulmonary hygiene - Bronchodilators PRN   Paroxysmal atrial fibrillation/sick sinus syndrome status pacemaker implantation HTN, HLD Previously on Eliquis - now being held given above - follow with cardiology, neurosurgery in regards to renewing this medication - in the mean time continue to hold unless counseled otherwise -Transiently on amiodarone - converted back to NSR - Likely provoked give above.   CKD stage IIIB - stable  - strict I/Os, trend renal panel, avoid nephrotoxins   Protein calorie malnutrition, moderate to severe - RD consult -Advance diet as tolerated    DVT prophylaxis: SCDs only given above Code Status: Full Family Communication: Wife at bedside  Status is: Inpt  Dispo: The patient is from: Home              Anticipated d/c is to: Home              Anticipated d/c date is: 24-48h              Patient currently NOT medically stable for discharge  Consultants:  Neuro surgery, PCCM  Antimicrobials:  Cefepime x 5 days  Subjective: No acute issues or events overnight, patient's mental status appears to be approaching baseline per wife at bedside denies nausea vomiting diarrhea constipation headache fevers chills or chest pain.  Ambulating today with therapy with minimal assistance.  Objective: Vitals:   02/12/22 0212 02/12/22 0406 02/12/22 0500 02/12/22 0758  BP: 105/63 (!) 108/55  110/62  Pulse: 62 60  (!) 59  Resp: 18 18  19   Temp: 98.3 F (36.8 C) (!) 97.4 F (36.3 C)  (!) 97.5 F (36.4 C)  TempSrc: Axillary Oral  Oral  SpO2: 98% 97%  98%  Weight:   77.4 kg   Height:        Intake/Output Summary (Last 24 hours) at 02/12/2022 0808 Last data filed at 02/12/2022 0100 Gross per 24 hour  Intake 293.33 ml  Output 2325 ml  Net -2031.67 ml   Filed Weights   02/10/22 0421 02/11/22 0409 02/12/22 0500  Weight: 80 kg 76.1 kg 77.4 kg    Examination:  General:  Pleasantly  resting in bed, No acute distress. HEENT:  Normocephalic atraumatic.  Sclerae nonicteric, noninjected.  Extraocular movements intact bilaterally. Neck:  Without mass or deformity.  Trachea is midline. Lungs:  Clear to auscultate bilaterally without rhonchi, wheeze, or rales. Heart:  Regular rate and rhythm.  Without murmurs, rubs, or gallops. Abdomen:  Soft, nontender, nondistended.  Without guarding or rebound. Extremities: Without cyanosis, clubbing, edema, or obvious deformity. Vascular:  Dorsalis pedis and posterior tibial pulses palpable bilaterally. Skin:  Warm and dry, no erythema, no ulcerations.   Data Reviewed: I have personally reviewed following labs and imaging studies  CBC: Recent Labs  Lab 02/06/22 1018 02/06/22 2214 02/09/22 0323 02/10/22 0424 02/11/22 0015 02/11/22 0426 02/12/22 0407  WBC 7.0   < > 11.1* 7.3 10.6* 8.0 4.6  NEUTROABS 5.6  --   --   --  9.5*  --   --   HGB 12.0*   < > 9.3* 10.3* 10.0* 9.6* 9.8*  HCT 34.9*   < > 25.8* 27.6* 27.6* 26.7* 28.2*  MCV 95.4   < > 92.8 90.8 92.9 93.0 95.6  PLT <5*   < > 73* 104* 124* 106* 74*   < > = values in this interval  not displayed.   Basic Metabolic Panel: Recent Labs  Lab 02/06/22 2214 02/07/22 0158 02/08/22 0534 02/08/22 0534 02/09/22 0323 02/09/22 1144 02/09/22 2056 02/10/22 0424 02/10/22 1644 02/11/22 0426 02/12/22 0407  NA 127*   < > 129*  --  127* 124*  --  128* 127* 132* 132*  K 3.5   < > 3.7  --  4.1 3.8  --  3.8 4.4 3.7 3.9  CL 97*   < > 98  --  98 95*  --  95* 98 103 104  CO2 20*   < > 22  --  21* 20*  --  21* 21* 20* 21*  GLUCOSE 182*   < > 171*  --  231* 255*  --  259* 254* 185* 129*  BUN 14   < > 20  --  23 24*  --  24* 22 23 26*  CREATININE 0.79   < > 0.80  --  0.72 0.78  --  0.85 0.95 0.76 0.89  CALCIUM 8.0*   < > 8.2*  --  7.6* 7.5*  --  7.7* 7.5* 7.3* 7.3*  MG 1.8  --  1.8  --  2.1  --   --  1.9  --  2.1  --   PHOS  --   --   --    < > 1.6*  --  2.0* 1.8* 2.0* 3.4 2.4*   < > =  values in this interval not displayed.   GFR: Estimated Creatinine Clearance: 63.6 mL/min (by C-G formula based on SCr of 0.89 mg/dL). Liver Function Tests: Recent Labs  Lab 02/06/22 2214 02/09/22 0323 02/11/22 0426 02/12/22 0407  AST 26 37  --   --   ALT 23 38  --   --   ALKPHOS 37* 37*  --   --   BILITOT 0.8 0.5  --   --   PROT 6.3* 6.9  --   --   ALBUMIN 3.1* 2.6* 2.5* 2.3*   No results for input(s): "LIPASE", "AMYLASE" in the last 168 hours. No results for input(s): "AMMONIA" in the last 168 hours. Coagulation Profile: Recent Labs  Lab 02/07/22 0253 02/08/22 0534 02/09/22 0323 02/11/22 0015 02/12/22 0407  INR 1.4* 1.3* 1.3* 1.4* 1.4*   Cardiac Enzymes: No results for input(s): "CKTOTAL", "CKMB", "CKMBINDEX", "TROPONINI" in the last 168 hours. BNP (last 3 results) No results for input(s): "PROBNP" in the last 8760 hours. HbA1C: No results for input(s): "HGBA1C" in the last 72 hours. CBG: Recent Labs  Lab 02/11/22 1223 02/11/22 1601 02/11/22 1941 02/11/22 2345 02/12/22 0407  GLUCAP 130* 125* 178* 162* 126*   Lipid Profile: No results for input(s): "CHOL", "HDL", "LDLCALC", "TRIG", "CHOLHDL", "LDLDIRECT" in the last 72 hours. Thyroid Function Tests: No results for input(s): "TSH", "T4TOTAL", "FREET4", "T3FREE", "THYROIDAB" in the last 72 hours. Anemia Panel: No results for input(s): "VITAMINB12", "FOLATE", "FERRITIN", "TIBC", "IRON", "RETICCTPCT" in the last 72 hours. Sepsis Labs: No results for input(s): "PROCALCITON", "LATICACIDVEN" in the last 168 hours.  Recent Results (from the past 240 hour(s))  MRSA Next Gen by PCR, Nasal     Status: None   Collection Time: 02/06/22  9:35 PM   Specimen: Nasal Mucosa; Nasal Swab  Result Value Ref Range Status   MRSA by PCR Next Gen NOT DETECTED NOT DETECTED Final    Comment: (NOTE) The GeneXpert MRSA Assay (FDA approved for NASAL specimens only), is one component of a comprehensive MRSA colonization  surveillance program. It is  not intended to diagnose MRSA infection nor to guide or monitor treatment for MRSA infections. Test performance is not FDA approved in patients less than 12 years old. Performed at [facility], [place] [address], [place], [place] [address]          Radiology Studies: DG Chest Port 1 View  Result Date: 02/12/2022 CLINICAL DATA:  Hypoxia.  History of subdural hematoma. EXAM: PORTABLE CHEST 1 VIEW COMPARISON:  Chest radiograph 02/10/2022 FINDINGS: Left chest dual chamber cardiac pacemaker. Slightly improved aeration in both lungs could represent decreased interstitial edema. There continues to be prominent lung markings bilaterally. Patient has known chronic lung changes from previous chest CT on 11/24/2021. Heart size is upper limits of normal but stable. Negative for a pneumothorax. Atherosclerotic calcifications at the aortic arch. IMPRESSION: 1. Slightly improved aeration in both lungs could represent decreased edema or decreasing airspace disease. 2. Persistent prominent lung markings most likely associated with chronic changes. Electronically Signed   By: Markus Daft M.D.   On: 02/12/2022 07:57   CT HEAD WO CONTRAST (5MM)  Result Date: 02/10/2022 CLINICAL DATA:  Mental status change with unknown cause EXAM: CT HEAD WITHOUT CONTRAST TECHNIQUE: Contiguous axial images were obtained from the base of the skull through the vertex without intravenous contrast. RADIATION DOSE REDUCTION: This exam was performed according to the departmental dose-optimization program which includes automated exposure control, adjustment of the mA and/or kV according to patient size and/or use of iterative reconstruction technique. COMPARISON:  Head CT from 3 days ago FINDINGS: Brain: Patchy lobulated mixed density subdural hematoma along the bilateral cerebral convexities and at the inter hemispheric fissure with even milder involvement along the bilateral tentorium. Different scan  angle on axial images, no detected progression when compared on coronal reformats. Maximal thickness measures up to 7 mm at the level of the mid falx. No cerebral mass effect, infarct, hydrocephalus, or masslike finding Vascular: No hyperdense vessel or unexpected calcification. Skull: Normal. Negative for fracture or focal lesion. Sinuses/Orbits: No acute finding. IMPRESSION: No progression of bilateral subdural hematoma not causing cerebral mass effect. Electronically Signed   By: Jorje Guild M.D.   On: 02/10/2022 11:02     Scheduled Meds:  sodium chloride   Intravenous Once   insulin aspart  0-9 Units Subcutaneous Q4H   pantoprazole  40 mg Oral QHS   Continuous Infusions:  sodium chloride 10 mL/hr at 02/12/22 0108   ceFEPime (MAXIPIME) IV Stopped (02/12/22 0141)     LOS: 6 days   Time spent: 38min  Fitz Matsuo C Larell Baney, DO [facility]  If 7PM-7AM, please contact night-coverage [email or web address]  02/12/2022, 8:08 AM

## 2022-02-12 NOTE — Progress Notes (Signed)
Occupational Therapy Treatment Patient Details Name: [name] MRN: [id number] DOB: [birth year] Today's Date: 02/12/2022   History of present illness Frank [name] is a 81 y.o. man admitted 02/06/22 with epistaxis, severe HA. CT revealed bilateral mixed density SDH. 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   OT comments  Pt progressing towards established OT goals. Pt oriented this session, but wife reports that cognition continues to seem off; pt did require cues to follow multistep commands and for problem solving throughout. Frequently asking "well what are we going to do about it" when needing to use restroom; sought cues for sequencing during functional mobility as well. Pt performing toilet transfer with min guard A and pericare with min A for problem solving and ensuring cleanliness. Pt washing hands and face at sink as well. Cues for pursed lip breathing during one desat after BM. Continue to highly recommend HHOT to optimize safety and independence in ADL and IADL.    Recommendations for follow up therapy are one component of a multi-disciplinary discharge planning process, led by the attending physician.  Recommendations may be updated based on patient status, additional functional criteria and insurance authorization.    Follow Up Recommendations  Home health OT     Assistance Recommended at Discharge Frequent or constant Supervision/Assistance  Patient can return home with the following  A little help with walking and/or transfers;A lot of help with bathing/dressing/bathroom;Assistance with cooking/housework;Direct supervision/assist for medications management;Direct supervision/assist  for financial management;Assist for transportation;Help with stairs or ramp for entrance   Equipment Recommendations  None recommended by OT (Pt has appropriate DME)    Recommendations for Other Services      Precautions / Restrictions Precautions Precautions: Fall Precaution Comments: watch O2 Restrictions Weight Bearing Restrictions: No       Mobility Bed Mobility Overal bed mobility: Needs Assistance Bed Mobility: Supine to Sit, Sit to Supine     Supine to sit: HOB elevated, Min guard Sit to supine: Min guard   General bed mobility comments: ues of raisl and increased time    Transfers Overall transfer level: Needs assistance Equipment used: None Transfers: Sit to/from Stand Sit to Stand: Min guard           General transfer comment: min guard for safety; one bout of HHA     Balance Overall balance assessment: Needs assistance Sitting-balance support: No upper extremity supported, Feet supported Sitting balance-Leahy Scale: Good     Standing balance support: Single extremity supported Standing balance-Leahy Scale: Fair Standing balance comment: min guard during functional mobility                           ADL either performed or assessed with clinical judgement   ADL Overall ADL's : Needs assistance/impaired Eating/Feeding: Set up;Sitting   Grooming: Wash/dry hands;Wash/dry face;Standing;Min guard Grooming Details (indicate cue type and reason): Min gaurd A for safety               Lower Body Dressing Details (indicate cue type and reason): Pt able to maintain standing balance with OT changing socks in standing due to leaking catheter Toilet Transfer: Min guard;Ambulation;Regular Toilet;Grab bars Toilet Transfer Details (indicate cue type and reason): Continues with reluctance to use RW. Pt performing toilet transfer for BM with min  guard A. Toileting- Clothing Manipulation and Hygiene: Minimal assistance Toileting - Clothing  Manipulation Details (indicate cue type and reason): Min A for problem solving and clothing management            Extremity/Trunk Assessment Upper Extremity Assessment Upper Extremity Assessment: Generalized weakness   Lower Extremity Assessment Lower Extremity Assessment: Defer to PT evaluation        Vision   Vision Assessment?: Vision impaired- to be further tested in functional context Additional Comments: Pt frequently with one eye closed throughout session. States "its just dry eyes" functionally mostly ok, sometimes missed the mark with depth perception during functional activities   Perception     Praxis      Cognition Arousal/Alertness: Awake/alert Behavior During Therapy: WFL for tasks assessed/performed Overall Cognitive Status: Impaired/Different from baseline Area of Impairment: Attention, Following commands, Safety/judgement, Awareness, Problem solving                   Current Attention Level: Sustained   Following Commands: Follows one step commands with increased time, Follows multi-step commands inconsistently Safety/Judgement: Decreased awareness of safety, Decreased awareness of deficits Awareness: Emergent Problem Solving: Difficulty sequencing, Requires verbal cues General Comments: pt following all commands, most difficulty with communication 2/2 HOH, making conversation throghout. Decreased safety awareness, attention to directions, and problem solving. Pt with bowel urgency and requesting to go; cues to wipe with reaching behind bottom to do so, as appempting to reach between legs while seated and tocuching toilet water with hand; difficulty understanding why        Exercises      Shoulder Instructions       General Comments Desatting to as low as 80 SpO2 while washing hands after BM. Pt requiring cues to utilize pursed lip breathing and sats increasing to 90s    Pertinent Vitals/ Pain       Pain Assessment Pain Assessment: No/denies  pain Pain Intervention(s): Monitored during session  Home Living                                          Prior Functioning/Environment              Frequency  Min 2X/week        Progress Toward Goals  OT Goals(current goals can now be found in the care plan section)  Progress towards OT goals: Progressing toward goals  Acute Rehab OT Goals Patient Stated Goal: get better OT Goal Formulation: With patient/family Time For Goal Achievement: 02/23/22 Potential to Achieve Goals: Fair ADL Goals Pt Will Perform Grooming: with supervision;standing Pt Will Perform Upper Body Dressing: with modified independence;sitting Pt Will Perform Lower Body Dressing: with supervision;sit to/from stand Pt Will Transfer to Toilet: ambulating;with modified independence Pt Will Perform Toileting - Clothing Manipulation and hygiene: with modified independence;sit to/from stand Additional ADL Goal #1: Pt will verbalize 3 strategies for fall prevention during ADL with no cues  Plan Discharge plan remains appropriate;Frequency remains appropriate    Co-evaluation                 AM-PAC OT "6 Clicks" Daily Activity     Outcome Measure   Help from another person eating meals?: A Little Help from another person taking care of personal grooming?: A Little Help from another person toileting, which includes using toliet, bedpan, or urinal?: A Little Help from another person bathing (  including washing, rinsing, drying)?: A Little Help from another person to put on and taking off regular upper body clothing?: A Little Help from another person to put on and taking off regular lower body clothing?: A Lot 6 Click Score: 17    End of Session Equipment Utilized During Treatment: Gait belt  OT Visit Diagnosis: Unsteadiness on feet (R26.81);Other abnormalities of gait and mobility (R26.89);Muscle weakness (generalized) (M62.81);Other symptoms and signs involving cognitive  function   Activity Tolerance Patient tolerated treatment well   Patient Left in bed;with call bell/phone within reach;with bed alarm set;with family/visitor present   Nurse Communication Mobility status;Precautions;Other (comment) (needs new foley)        Time: 6701-4103 OT Time Calculation (min): 39 min  Charges: OT General Charges $OT Visit: 1 Visit OT Treatments $Self Care/Home Management : 38-52 mins  Elder Cyphers, OTR/L [facility] Acute Rehabilitation Office: [phone]    Magnus Ivan 02/12/2022, 3:07 PM

## 2022-02-13 DIAGNOSIS — Z7189 Other specified counseling: Secondary | ICD-10-CM | POA: Diagnosis not present

## 2022-02-13 DIAGNOSIS — Z66 Do not resuscitate: Secondary | ICD-10-CM

## 2022-02-13 DIAGNOSIS — Z515 Encounter for palliative care: Secondary | ICD-10-CM | POA: Diagnosis not present

## 2022-02-13 LAB — GLUCOSE, CAPILLARY
Glucose-Capillary: 153 mg/dL — ABNORMAL HIGH (ref 70–99)
Glucose-Capillary: 169 mg/dL — ABNORMAL HIGH (ref 70–99)
Glucose-Capillary: 174 mg/dL — ABNORMAL HIGH (ref 70–99)
Glucose-Capillary: 176 mg/dL — ABNORMAL HIGH (ref 70–99)

## 2022-02-13 MED ORDER — CEPHALEXIN 250 MG PO CAPS: 250.0000 mg | ORAL_CAPSULE | Freq: Four times a day (QID) | ORAL | 0 refills | Status: AC

## 2022-02-13 MED ORDER — BIOTENE DRY MOUTH MT LIQD: 15.0000 mL | OROMUCOSAL | Status: DC | PRN

## 2022-02-13 NOTE — Discharge Summary (Signed)
Physician Discharge Summary  [name] FVC:[id number] DOB: 1940/06/09 DOA: 02/06/2022  PCP: [name], MD  Admit date: 02/06/2022 Discharge date: 02/13/2022  Admitted From: Home Disposition: Home  Recommendations for Outpatient Follow-up:  Follow up with PCP in 1-2 weeks Follow-up with oncology as scheduled -repeat labs including CBC  Home Health: PT OT Equipment/Devices: None  Discharge Condition: Stable CODE STATUS: DNR Diet recommendation: Advance as tolerated  Brief/Interim Summary: Patient is an 81 year old man who presented to [facility] ED 12/9 with complaints of feeling generally unwell and headache x 1 week. Recently had his COVID booster shot about a week PTA. Soon after noted increased bruising on his arms and headache a day after his COVID vaccine; also with c/o nausea and epistaxis (seen by ENT). PMHx significant for HTN, HLD, PAF (on Eliquis), sick sinus syndrome (pacemaker in situ), AAA, CKD stage IIIb, RCC (s/p L radical nephrectomy 2020, lung/pancreatic involvement), BPH, GERD, diverticulosis. PCCM called to admit; CT Head demonstrated bilateral SDH without significant mass effect or midline shift - platelets and Andexxa were administered. NSGY consulted.     12/9 Presented to [facility] ED for headache, increased bruising. CT Head with bilateral SDH, severe thrombocytopenia < 5. 2U Plt transfused. 12/10 Head CT showing bilateral subdural hematomas ~17mm in thickness, no evidence of shift or other evidence of mass effect. Heme consulted for thrombocytopenia, IVIG/dex given. Additional 5U Plt given. 12/11 Plt improved to 26. VSS. More interactive/alert. IVIG dose #2, continue dex. 12/12 Plt 73 (26), post-IVIG/dex. Improved mental status. Ongoing O2 needs. CXR with increased bilateral airspace disease, L > R small effusions. Lasix given. 12/13 worsening encephalopathy on precedex> agitated, mostly non-verbal, repeat CTH stable.  Amio for afib w/RVR 12/14 questionable  aspiration PNA vs pneumonitis - antibiotics initiated - improving drastically off oxygen, symptoms resolving within 24h  Discharge Diagnoses:  Principal Problem:   Subdural hematoma (HCC) Active Problems:   Renal cell carcinoma (HCC)   Acute ITP (HCC)   Protein-calorie malnutrition, severe   Bilateral subdural hematoma in setting of severe thrombocytopenia and recent Eliquis use Previously on Eliquis, took naproxen for headache. Received Andexxa and Plt transfusion on admission. CT Head 12/10 with stable SDH, worsening mental status/ agitated delirium overnight 12/12 on precedex, off by 12/13 - NSGY following, appreciate recs, discussing if candidate for MMA embolization - hold anticoagulation and antiplatelets -follow-up with PCP for further discussion on anticoagulation as we discussed the risk at this time outweighs the benefit of anticoagulation   Agitated delirium, resolved - possibly related to high dose decadron for 4 days, stopped 12/13 - patient appears back to baseline 12/14   Aspiration pneumonia vs pneumonitis -Likely secondary to delirium above -Transition to p.o. antibiotics at discharge for remainder of course   Thrombocytopenia, severe Plt < 5 on admission. Smear neg schistocytes however Ddx includes immunotherapy delayed ITP vs malignancy induced ITP. Low suspicion for sepsis. Received 7U Plt in total 12/9-12/11 without improvement.  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 Followed by Dr. [name]. Previously on ipilimumab and nivolumab - Ipilimumab and nivolumab held per  Oncology (12/03/2021) - f/u outpt per Oncology on the 20th  as scheduled   Epistaxis, resolved Likely in the setting of severe thrombocytopenia. Seen by ENT 12/8. - Not a candidate for cauterization - No further epistaxis s/p Plt/Andexxa - F/u with ENT as outpatient - hold FFP for now as no more blood clots.  INR around baseline 1.4   Obstructive lung disease Emphysema Pulmonary fibrosis Pulmonary edema  Previous CT scan findings of multiple nodules. - Further outpatient workup as clinically appropriate -Follow-up with outpatient pulm as scheduled - Supplemental O2 for O2 sat 88-92%> WITH humidity  - Pulmonary hygiene - Bronchodilators PRN   Paroxysmal atrial fibrillation/sick sinus syndrome status pacemaker implantation HTN, HLD Previously on Eliquis - now being held given above - follow with cardiology, neurosurgery in regards to renewing this medication - in the mean time continue to hold unless counseled otherwise -Transiently on amiodarone - converted back to NSR - Likely provoked give above.   CKD stage IIIB - stable  - strict I/Os, trend renal panel, avoid nephrotoxins    Protein calorie malnutrition, moderate to severe - RD consult - Advance diet as tolerated  Discharge Instructions   Allergies as of 02/13/2022       Reactions   Hydrocodone Other (See Comments)   Caused confusion    Monosodium Glutamate Other (See Comments)   Caused headaches   Naproxen Sodium    Mouth blisters with prolonged use.  Left nephrectomy 2020   Other    Perfume - headaches         Medication List     STOP taking these medications    Eliquis 5 MG Tabs tablet Generic drug: apixaban   naproxen sodium 220 MG tablet Commonly known as: ALEVE   prochlorperazine 10 MG tablet Commonly known as: COMPAZINE       TAKE these medications    acetaminophen 500 MG tablet Commonly known as: TYLENOL Take 1,000 mg by mouth every 6 (six) hours as needed for mild pain.   CALCIUM  500 PO Take 500 mg by mouth 2 (two) times daily.   CENTRUM MULTI + OMEGA 3 PO Take 1 tablet by mouth daily.   cephALEXin 250 MG capsule Commonly known as: KEFLEX Take 1 capsule (250 mg total) by mouth 4 (four) times daily for 4 days.   denosumab 60 MG/ML Sosy injection Commonly known as: PROLIA Inject 60 mg into the skin every 6 (six) months.   esomeprazole 40 MG capsule Commonly known as: NEXIUM Take 1 capsule (40 mg total) by mouth daily before breakfast.   omega-3 acid ethyl esters 1 g capsule Commonly known as: LOVAZA Take 2 capsules by mouth twice daily   rosuvastatin 10 MG tablet Commonly known as: CRESTOR Take 1 tablet by mouth once daily   solifenacin 10 MG tablet Commonly known as: VESICARE Take 1 tablet by mouth once daily What changed: when to take this   VITAMIN D PO Take 1,000 Units by mouth daily.        Allergies  Allergen Reactions   Hydrocodone Other (See Comments)    Caused confusion    Monosodium Glutamate Other (See Comments)    Caused headaches   Naproxen Sodium     Mouth blisters with prolonged use.  Left nephrectomy 2020   Other     Perfume - headaches     Consultations: PCCM, neurosurgery  Procedures/Studies: DG Chest Port 1 View  Result Date: 02/12/2022 CLINICAL DATA:  Hypoxia.  History of subdural hematoma. EXAM: PORTABLE CHEST 1 VIEW COMPARISON:  Chest radiograph 02/10/2022 FINDINGS:  Left chest dual chamber cardiac pacemaker. Slightly improved aeration in both lungs could represent decreased interstitial edema. There continues to be prominent lung markings bilaterally. Patient has known chronic lung changes from previous chest CT on 11/24/2021. Heart size is upper limits of normal but stable. Negative for a pneumothorax. Atherosclerotic calcifications at the aortic arch. IMPRESSION: 1. Slightly improved aeration in both lungs could represent decreased edema or decreasing airspace disease. 2. Persistent prominent lung markings most  likely associated with chronic changes. Electronically Signed   By: Markus Daft M.D.   On: 02/12/2022 07:57   CT HEAD WO CONTRAST (5MM)  Result Date: 02/10/2022 CLINICAL DATA:  Mental status change with unknown cause EXAM: CT HEAD WITHOUT CONTRAST TECHNIQUE: Contiguous axial images were obtained from the base of the skull through the vertex without intravenous contrast. RADIATION DOSE REDUCTION: This exam was performed according to the departmental dose-optimization program which includes automated exposure control, adjustment of the mA and/or kV according to patient size and/or use of iterative reconstruction technique. COMPARISON:  Head CT from 3 days ago FINDINGS: Brain: Patchy lobulated mixed density subdural hematoma along the bilateral cerebral convexities and at the inter hemispheric fissure with even milder involvement along the bilateral tentorium. Different scan angle on axial images, no detected progression when compared on coronal reformats. Maximal thickness measures up to 7 mm at the level of the mid falx. No cerebral mass effect, infarct, hydrocephalus, or masslike finding Vascular: No hyperdense vessel or unexpected calcification. Skull: Normal. Negative for fracture or focal lesion. Sinuses/Orbits: No acute finding. IMPRESSION: No progression of bilateral subdural hematoma not causing cerebral mass effect. Electronically Signed   By: Jorje Guild M.D.   On: 02/10/2022 11:02   DG Chest Port 1 View  Result Date: 02/10/2022 CLINICAL DATA:  Pulmonary edema.AMS-RESTRAINED NOW Rover EXAM: PORTABLE CHEST - 1 VIEW COMPARISON:  the previous day's study FINDINGS: Stable left subclavian dual lead transvenous pacemaker. Partial improvement in the interstitial and airspace edema or infiltrates seen previously. Stable mild cardiomegaly.  Aortic Atherosclerosis (ICD10-170.0). No effusion. Visualized bones unremarkable. IMPRESSION: Partial improvement in interstitial and airspace opacities  Electronically Signed   By: Lucrezia Europe M.D.   On: 02/10/2022 08:18   ECHOCARDIOGRAM COMPLETE  Result Date: 02/09/2022    ECHOCARDIOGRAM REPORT   Patient Name:   [name] Date of Exam: 02/09/2022 Medical Rec #:  [id number]           Height:       65.0 in Accession #:    [id number]          Weight:       176.6 lb Date of Birth:  March 04, 1940           BSA:          1.876 m Patient Age:    20 years            BP:           134/59 mmHg Patient Gender: M                   HR:           65 bpm. Exam Location:  Inpatient Procedure: 2D Echo, 3D Echo, Cardiac Doppler and Color Doppler Indications:    R00.8 Other abnormalities of heart beat  History:        Patient has prior history of Echocardiogram examinations, most  recent 06/26/2018. Abnormal ECG and Pacemaker, Arrythmias:Atrial                 Fibrillation and SSS, Signs/Symptoms:Altered Mental Status; Risk                 Factors:Hypertension and Dyslipidemia. Metastatic cancer. SDH.  Sonographer:    Roseanna Rainbow RDCS Referring Phys: [id number] [facility] M REESE  Sonographer Comments: Technically difficult study due to poor echo windows. IMPRESSIONS  1. Left ventricular ejection fraction, by estimation, is 60 to 65%. The left ventricle has normal function. The left ventricle has no regional wall motion abnormalities. Left ventricular diastolic parameters are consistent with Grade II diastolic dysfunction (pseudonormalization).  2. Right ventricular systolic function is normal. The right ventricular size is normal. There is moderately elevated pulmonary artery systolic pressure.  3. Left atrial size was moderately dilated.  4. The mitral valve is abnormal due to mitral valve prolapse. Mild mitral valve regurgitation.  5. Tricuspid valve regurgitation is mild to moderate.  6. The aortic valve is tricuspid. There is mild calcification of the aortic valve. There is mild thickening of the aortic valve. Aortic valve regurgitation is mild.  7. Aortic  dilatation noted. There is mild dilatation of the ascending aorta, measuring 42 mm. Comparison(s): Increase in aortic regurgitation. FINDINGS  Left Ventricle: Left ventricular ejection fraction, by estimation, is 60 to 65%. The left ventricle has normal function. The left ventricle has no regional wall motion abnormalities. The left ventricular internal cavity size was normal in size. There is  no left ventricular hypertrophy. Left ventricular diastolic parameters are consistent with Grade II diastolic dysfunction (pseudonormalization). Right Ventricle: The right ventricular size is normal. No increase in right ventricular wall thickness. Right ventricular systolic function is normal. There is moderately elevated pulmonary artery systolic pressure. The tricuspid regurgitant velocity is 2.95 m/s, and with an assumed right atrial pressure of 15 mmHg, the estimated right ventricular systolic pressure is 33.3 mmHg. Left Atrium: Left atrial size was moderately dilated. Right Atrium: Right atrial size was normal in size. Pericardium: There is no evidence of pericardial effusion. Mitral Valve: The mitral valve is abnormal. Mild mitral valve regurgitation. Tricuspid Valve: The tricuspid valve is normal in structure. Tricuspid valve regurgitation is mild to moderate. No evidence of tricuspid stenosis. Aortic Valve: The aortic valve is tricuspid. There is mild calcification of the aortic valve. There is mild thickening of the aortic valve. Aortic valve regurgitation is mild. Aortic regurgitation PHT measures 477 msec. Aortic valve mean gradient measures 5.0 mmHg. Aortic valve peak gradient measures 9.9 mmHg. Aortic valve area, by VTI measures 3.39 cm. Pulmonic Valve: The pulmonic valve was normal in structure. Pulmonic valve regurgitation is not visualized. No evidence of pulmonic stenosis. Aorta: Aortic dilatation noted. There is mild dilatation of the ascending aorta, measuring 42 mm. IAS/Shunts: No atrial level shunt  detected by color flow Doppler. Additional Comments: A device lead is visualized in the right ventricle and right atrium.  LEFT VENTRICLE PLAX 2D LVIDd:         4.90 cm     Diastology LVIDs:         2.90 cm     LV e' medial:    6.74 cm/s LV PW:         0.80 cm     LV E/e' medial:  14.6 LV IVS:        1.00 cm     LV e' lateral:   11.70 cm/s LVOT diam:  2.40 cm     LV E/e' lateral: 8.4 LV SV:         118 LV SV Index:   63 LVOT Area:     4.52 cm  LV Volumes (MOD) LV vol d, MOD A2C: 97.9 ml LV vol d, MOD A4C: 85.8 ml LV vol s, MOD A2C: 34.3 ml LV vol s, MOD A4C: 31.9 ml LV SV MOD A2C:     63.6 ml LV SV MOD A4C:     85.8 ml LV SV MOD BP:      57.0 ml RIGHT VENTRICLE             IVC RV S prime:     16.00 cm/s  IVC diam: 2.50 cm TAPSE (M-mode): 3.0 cm LEFT ATRIUM              Index        RIGHT ATRIUM           Index LA diam:        4.60 cm  2.45 cm/m   RA Area:     13.30 cm LA Vol (A2C):   102.0 ml 54.37 ml/m  RA Volume:   31.60 ml  16.84 ml/m LA Vol (A4C):   66.7 ml  35.55 ml/m LA Biplane Vol: 83.2 ml  44.35 ml/m  AORTIC VALVE                     PULMONIC VALVE AV Area (Vmax):    3.43 cm      PR End Diast Vel: 1.40 msec AV Area (Vmean):   3.40 cm AV Area (VTI):     3.39 cm AV Vmax:           157.00 cm/s AV Vmean:          107.000 cm/s AV VTI:            0.347 m AV Peak Grad:      9.9 mmHg AV Mean Grad:      5.0 mmHg LVOT Vmax:         119.00 cm/s LVOT Vmean:        80.500 cm/s LVOT VTI:          0.260 m LVOT/AV VTI ratio: 0.75 AI PHT:            477 msec  AORTA Ao Root diam: 3.80 cm Ao Asc diam:  4.15 cm MITRAL VALVE               TRICUSPID VALVE MV Area (PHT): 3.29 cm    TR Peak grad:   34.8 mmHg MV Decel Time: 231 msec    TR Vmax:        295.00 cm/s MV E velocity: 98.10 cm/s MV A velocity: 57.75 cm/s  SHUNTS MV E/A ratio:  1.70        Systemic VTI:  0.26 m                            Systemic Diam: 2.40 cm Rudean Haskell MD Electronically signed by Rudean Haskell MD Signature Date/Time:  02/09/2022/4:02:23 PM    Final    DG CHEST PORT 1 VIEW  Result Date: 02/09/2022 CLINICAL DATA:  Shortness of EXAM: PORTABLE CHEST 1 VIEW COMPARISON:  Radiograph 02/06/2022 FINDINGS: Unchanged enlarged cardiac silhouette with dual chamber pacemaker. Markedly increased interstitial and airspace disease bilaterally, left greater than right with small effusions. No pneumothorax. Bones  are unchanged. IMPRESSION: Markedly increased interstitial and airspace disease bilaterally, left greater than right with small effusions, concerning for multifocal pneumonia and/or aspiration, pulmonary edema can have a similar appearance. Electronically Signed   By: Maurine Simmering M.D.   On: 02/09/2022 10:53   CT HEAD WO CONTRAST (5MM)  Result Date: 02/07/2022 CLINICAL DATA:  Follow-up subdural hematoma. EXAM: CT HEAD WITHOUT CONTRAST TECHNIQUE: Contiguous axial images were obtained from the base of the skull through the vertex without intravenous contrast. RADIATION DOSE REDUCTION: This exam was performed according to the departmental dose-optimization program which includes automated exposure control, adjustment of the mA and/or kV according to patient size and/or use of iterative reconstruction technique. COMPARISON:  02/06/2022 and 04/24/2018 FINDINGS: Brain: Evidence of patient's acute subdural hematoma along the hemispheric fissure without significant change measuring 6 mm in thickness. Evidence of patient's known small bilateral patchy mixed attenuation subdural hematomas over the convexities right worse than left. No significant mass effect or midline shift. No intraparenchymal or subarachnoid hemorrhage. Ventricles, cisterns and other CSF spaces are otherwise unchanged. 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 Vascular: No hyperdense vessel or unexpected calcification. Skull: Normal. Negative for fracture or focal lesion. Sinuses/Orbits: No acute finding. Other: None. IMPRESSION: 1. Stable acute subdural  hematoma along the hemispheric fissure without significant change measuring 6 mm in thickness. Evidence of patient's known small bilateral mixed chronicity subdural hematomas over the convexities right worse than left. No significant mass effect or midline shift. 2. 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 Electronically Signed   By: Marin Olp M.D.   On: 02/07/2022 11:47   DG Chest Port 1 View  Result Date: 02/06/2022 CLINICAL DATA:  Altered mental status EXAM: PORTABLE CHEST 1 VIEW COMPARISON:  CXR 07/19/18, CT Chest 11/24/21 FINDINGS: Left-sided dual lead cardiac device with unchanged lead positioning. No pleural effusion. No pneumothorax. Unchanged cardiac and mediastinal contours compared to prior exam there is a new hazy pulmonary opacity in the right lung base. No displaced rib fractures. Visualized upper abdomen is unremarkable. Aortic atherosclerotic calcifications. IMPRESSION: New hazy pulmonary opacity in the right lung base, which may represent atelectasis or infection in the appropriate clinical setting. Electronically Signed   By: Marin Roberts M.D.   On: 02/06/2022 11:05   CT HEAD WO CONTRAST (5MM)  Result Date: 02/06/2022 CLINICAL DATA:  Headache. EXAM: CT HEAD WITHOUT CONTRAST TECHNIQUE: Contiguous axial images were obtained from the base of the skull through the vertex without intravenous contrast. RADIATION DOSE REDUCTION: This exam was performed according to the departmental dose-optimization program which includes automated exposure control, adjustment of the mA and/or kV according to patient size and/or use of iterative reconstruction technique. COMPARISON:  04/24/2018. FINDINGS: Brain: Positive for intracranial hemorrhage. Hyperattenuating subdural hemorrhage extends along the interhemispheric fissure, more prominently on the right, maximum thickness 7 mm. Small bilateral subdural hemorrhages, right greater than left, extend around both cerebral hemispheres, mixed  attenuation, with areas of hyperattenuation, intermediate attenuation and low attenuation. No evidence of parenchymal or subarachnoid hemorrhage. No intraventricular hemorrhage. No midline shift. No evidence of an ischemic infarct. No parenchymal or extra-axial masses. Vascular: No hyperdense vessel or unexpected calcification. Skull: Normal. Negative for fracture or focal lesion. Sinuses/Orbits: Globes and orbits are unremarkable. Sinuses are clear. Other: None. IMPRESSION: 1. Bilateral subdural hemorrhage as detailed, a significant portion of which appears acute/recent. No significant mass effect or midline shift. Critical Value/emergent results were called by telephone at the time of interpretation on 02/06/2022 at  10:44 am to provider Janeece Fitting , who verbally acknowledged these results. Electronically Signed   By: Lajean Manes M.D.   On: 02/06/2022 10:45     Subjective: No acute issues or events overnight   Discharge Exam: Vitals:   02/13/22 0824 02/13/22 1131  BP: 116/64 125/72  Pulse: 74 69  Resp: 17 17  Temp: 98.2 F (36.8 C) 98.6 F (37 C)  SpO2: 95% 97%   Vitals:   02/13/22 0300 02/13/22 0415 02/13/22 0824 02/13/22 1131  BP:  (!) 145/79 116/64 125/72  Pulse:  84 74 69  Resp: 18 20 17 17   Temp:  98.7 F (37.1 C) 98.2 F (36.8 C) 98.6 F (37 C)  TempSrc:  Oral Oral Oral  SpO2:  96% 95% 97%  Weight:      Height:        General: Pt is alert, awake, not in acute distress Cardiovascular: RRR, S1/S2 +, no rubs, no gallops Respiratory: CTA bilaterally, no wheezing, no rhonchi Abdominal: Soft, NT, ND, bowel sounds + Extremities: no edema, no cyanosis    The results of significant diagnostics from this hospitalization (including imaging, microbiology, ancillary and laboratory) are listed below for reference.     Microbiology: Recent Results (from the past 240 hour(s))  MRSA Next Gen by PCR, Nasal     Status: None   Collection Time: 02/06/22  9:35 PM   Specimen: Nasal  Mucosa; Nasal Swab  Result Value Ref Range Status   MRSA by PCR Next Gen NOT DETECTED NOT DETECTED Final    Comment: (NOTE) The GeneXpert MRSA Assay (FDA approved for NASAL specimens only), is one component of a comprehensive MRSA colonization surveillance program. It is not intended to diagnose MRSA infection nor to guide or monitor treatment for MRSA infections. Test performance is not FDA approved in patients less than 43 years old. Performed at [facility], [place] [address], [place], [place] [address]      Labs: BNP (last 3 results) No results for input(s): "BNP" in the last 8760 hours. Basic Metabolic Panel: Recent Labs  Lab 02/06/22 2214 02/07/22 0158 02/08/22 0534 02/08/22 0534 02/09/22 0323 02/09/22 1144 02/09/22 2056 02/10/22 0424 02/10/22 1644 02/11/22 0426 02/12/22 0407  NA 127*   < > 129*  --  127* 124*  --  128* 127* 132* 132*  K 3.5   < > 3.7  --  4.1 3.8  --  3.8 4.4 3.7 3.9  CL 97*   < > 98  --  98 95*  --  95* 98 103 104  CO2 20*   < > 22  --  21* 20*  --  21* 21* 20* 21*  GLUCOSE 182*   < > 171*  --  231* 255*  --  259* 254* 185* 129*  BUN 14   < > 20  --  23 24*  --  24* 22 23 26*  CREATININE 0.79   < > 0.80  --  0.72 0.78  --  0.85 0.95 0.76 0.89  CALCIUM 8.0*   < > 8.2*  --  7.6* 7.5*  --  7.7* 7.5* 7.3* 7.3*  MG 1.8  --  1.8  --  2.1  --   --  1.9  --  2.1  --   PHOS  --   --   --    < > 1.6*  --  2.0* 1.8* 2.0* 3.4 2.4*   < > = values in this interval  not displayed.   Liver Function Tests: Recent Labs  Lab 02/06/22 2214 02/09/22 0323 02/11/22 0426 02/12/22 0407  AST 26 37  --   --   ALT 23 38  --   --   ALKPHOS 37* 37*  --   --   BILITOT 0.8 0.5  --   --   PROT 6.3* 6.9  --   --   ALBUMIN 3.1* 2.6* 2.5* 2.3*   No results for input(s): "LIPASE", "AMYLASE" in the last 168 hours. No results for input(s): "AMMONIA" in the last 168 hours. CBC: Recent Labs  Lab 02/09/22 0323 02/10/22 0424 02/11/22 0015 02/11/22 0426 02/12/22 0407   WBC 11.1* 7.3 10.6* 8.0 4.6  NEUTROABS  --   --  9.5*  --   --   HGB 9.3* 10.3* 10.0* 9.6* 9.8*  HCT 25.8* 27.6* 27.6* 26.7* 28.2*  MCV 92.8 90.8 92.9 93.0 95.6  PLT 73* 104* 124* 106* 74*   Cardiac Enzymes: No results for input(s): "CKTOTAL", "CKMB", "CKMBINDEX", "TROPONINI" in the last 168 hours. BNP: Invalid input(s): "POCBNP" CBG: Recent Labs  Lab 02/12/22 2011 02/13/22 0004 02/13/22 0414 02/13/22 0825 02/13/22 1132  GLUCAP 202* 169* 174* 153* 176*   D-Dimer No results for input(s): "DDIMER" in the last 72 hours. Hgb A1c No results for input(s): "HGBA1C" in the last 72 hours. Lipid Profile No results for input(s): "CHOL", "HDL", "LDLCALC", "TRIG", "CHOLHDL", "LDLDIRECT" in the last 72 hours. Thyroid function studies No results for input(s): "TSH", "T4TOTAL", "T3FREE", "THYROIDAB" in the last 72 hours.  Invalid input(s): "FREET3" Anemia work up No results for input(s): "VITAMINB12", "FOLATE", "FERRITIN", "TIBC", "IRON", "RETICCTPCT" in the last 72 hours. Urinalysis    Component Value Date/Time   COLORURINE YELLOW 02/06/2022 1018   APPEARANCEUR CLEAR 02/06/2022 1018   LABSPEC 1.013 02/06/2022 1018   PHURINE 7.0 02/06/2022 1018   GLUCOSEU 50 (A) 02/06/2022 1018   GLUCOSEU NEGATIVE 01/19/2021 1423   HGBUR NEGATIVE 02/06/2022 1018   BILIRUBINUR NEGATIVE 02/06/2022 1018   BILIRUBINUR neg 02/20/2015 1133   KETONESUR NEGATIVE 02/06/2022 1018   PROTEINUR NEGATIVE 02/06/2022 1018   UROBILINOGEN 0.2 01/19/2021 1423   NITRITE NEGATIVE 02/06/2022 1018   LEUKOCYTESUR NEGATIVE 02/06/2022 1018   Sepsis Labs Recent Labs  Lab 02/10/22 0424 02/11/22 0015 02/11/22 0426 02/12/22 0407  WBC 7.3 10.6* 8.0 4.6   Microbiology Recent Results (from the past 240 hour(s))  MRSA Next Gen by PCR, Nasal     Status: None   Collection Time: 02/06/22  9:35 PM   Specimen: Nasal Mucosa; Nasal Swab  Result Value Ref Range Status   MRSA by PCR Next Gen NOT DETECTED NOT DETECTED Final     Comment: (NOTE) The GeneXpert MRSA Assay (FDA approved for NASAL specimens only), is one component of a comprehensive MRSA colonization surveillance program. It is not intended to diagnose MRSA infection nor to guide or monitor treatment for MRSA infections. Test performance is not FDA approved in patients less than 51 years old. Performed at [facility], [place] [address], [place],  [address]      Time coordinating discharge: Over 30 minutes  SIGNED:   Little Ishikawa, DO [facility] 02/13/2022, 1:41 PM Pager   If 7PM-7AM, please contact night-coverage [email or web address]

## 2022-02-13 NOTE — Consult Note (Signed)
Palliative Medicine Inpatient Consult Note  Consulting Provider:  Arnell Asal, NP   Reason for consult:   [place]  Reason for Consult? cancer with ongoing complications related to treatment, establish long term GOC, and maximize quality of life, etc   02/13/2022  HPI:  [name] is an 81 year old man PMHx significant for HTN, HLD, PAF (on Eliquis), sick sinus syndrome (pacemaker in situ), AAA, CKD stage IIIb, RCC (s/p L radical nephrectomy 2020, lung/pancreatic involvement), BPH, GERD, diverticulosis. Admitted w/ complaints of feeling generally unwell and headache x 1 week.  CT Head demonstrated bilateral SDH without significant mass effect or midline shift.  Palliative care has been asked to get involved for further goals of care conversations in the setting of renal cell carcinoma.  Clinical Assessment/Goals of Care:  *Please note that this is a verbal dictation therefore any spelling or grammatical errors are due to the "[place] [facility]" system interpretation.  I have reviewed medical records including EPIC notes, labs and imaging, received report from bedside RN, assessed the patient who is lying in bed in no acute distress.    I met with Frank [name] and his spouse, Frank [name] to further discuss diagnosis prognosis, GOC, EOL wishes, disposition and options.   I introduced Palliative Medicine as specialized medical care for people living with serious illness. It focuses on providing relief from the symptoms and stress of a serious illness. The goal is to improve quality of life for both the patient and the family.  Medical History Review and Understanding:  A formal review of Frank [name]'s past medical history was completed inclusive of his history of renal cell carcinoma, stage III chronic kidney disease, abdominal aortic aneurysm, BPH, GERD, diverticulosis, sick sinus syndrome requiring a pacemaker and atrial  fibrillation.  Social History:  Frank [name] and I reviewed his social history.  He shares that he is originally from [facility].  He has lived in [place] for the majority of his life.  He has been married to his wife for the past 51 years and they had a daughter who died tragically in her sleep 2 years ago at the age of 74.  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  He shares that the replica's of reptiles that you see at common places like [place] have been created by he and his wife.  He expresses that he does have faith and is a member of the "stock yard [employer]".  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  He shares that he enjoys living life.  Functional and Nutritional State:  Prior to hospitalization [name] was fully functional able to perform all B ADLs and IADLs independently.  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  He shares that he had just completed some renovations to his deck outside.  His appetite has been wavering for the past 4 months after he got immunotherapy as he has dry mouth secondary to this.  Palliative Symptoms:  Dry mouth recommend Biotene mouthwash regularly to help.  Adult failure to thrive with 10 pound weight loss over the past few months patient has met with a registered dietitian and I will recommend that this occurs again.  Patient denies significant pain, nausea, dyspnea.  Advance Directives:  A detailed discussion was had today regarding advanced  directives.  Yes Frank [name] and his wife updated these 2 years ago though this was prior to their daughter's death so they vocalized the need to update again.  Code Status:  Concepts specific to code status, artifical feeding and hydration, continued IV antibiotics and rehospitalization was had.  The difference between a aggressive medical intervention path  and a  palliative comfort care path for this patient at this time was had.   Encouraged patient/family to consider DNR/DNI status understanding evidenced based poor outcomes in similar hospitalized patient, as the cause of arrest is likely associated with advanced chronic/terminal illness rather than an easily reversible acute cardio-pulmonary event. I explained that DNR/DNI does not change the medical plan and it only comes into effect after a person has arrested (died).  It is a protective measure to keep [place] from harming the patient in their last moments of life. [place] was agreeable to DNR/DNI with understanding that patient would not receive CPR, defibrillation, ACLS medications, or intubation.   Discussion:  We discussed in detail patient's oncological history.  He shares with me that 1 kidney has been removed and that 4 months ago he was continuing on his second round of immunotherapy for the identified nodules in his lungs.  Unfortunately due to the side effect of this he was unable to continue with treatments.  He expresses that there is a follow-up with Dr. [name] in the oncoming week to identify whether or not the tumors have remained the same size.  We did discuss briefly ago additional treatment options the idea of hospice care.  Frank [name] is very much someone who vocalizes the appreciation of life but understanding that he will not live forever.  Asked him of transitioning home and having outpatient palliative support through the [facility] symptom management clinic.  I shared that my colleague [name] would be able to keep close eyes on him and have ongoing conversations regarding his long-term prognosis.  Discussed the importance of continued conversation with family and their  medical providers regarding overall plan of care and treatment options, ensuring decisions are within the context of the patients values and GOCs.  Decision Maker: Frank, [name] (Spouse): [phone] ([place])   SUMMARY OF  RECOMMENDATIONS   DNAR/DNI  Provided a MOST form for review  Patient will need to complete another advanced directives in the near future though his surrogate decision maker is his wife Frank [name]  Patient aware of his present health state and that he is unable to get additional immunotherapy at this time  Referral to palliative symptom management clinic --> has messaged Librarian, academic and Maygan Janalyn Harder to set up an appointment  Ongoing palliative support  Code Status/Advance Care Planning: DNAR/DNI   Symptom Management:  Dry mouth: -Biotene mouthwash twice daily  Adult failure to thrive: -Referral to nutritionist -Can consider mirtazapine as an option to help with appetite stimulation  Palliative Prophylaxis:  Aspiration, Bowel Regimen, Delirium Protocol, Frequent Pain Assessment, Oral Care, Palliative Wound Care, and Turn Reposition  Additional Recommendations (Limitations, Scope, Preferences): Continue current care  Psycho-social/Spiritual:  Desire for further Chaplaincy support: Not presently Additional Recommendations: Education on metastatic disease   Prognosis: Defer to oncology  Discharge Planning: Discharge home once medically optimized with home health and referred to outpatient palliative care clinic.  Vitals:   02/13/22 0824 02/13/22 1131  BP: 116/64 125/72  Pulse: 74 69  Resp: 17 17  Temp: 98.2 F (36.8 C) 98.6 F (37 C)  SpO2: 95% 97%    Intake/Output Summary (Last  24 hours) at 02/13/2022 1139 Last data filed at 02/13/2022 0700 Gross per 24 hour  Intake 1060 ml  Output 1350 ml  Net -290 ml   Last Weight  Most recent update: 02/12/2022  6:56 AM    Weight  77.4 kg (170 lb 10.2 oz)            Gen: Elderly Caucasian male no distress HEENT: Arienna Benegas speaking mucous membranes CV: Regular rate and rhythm PULM:  On RA,breathing  ABD: soft/nontender  EXT: No edema  Neuro: Alert and oriented x3  PPS: 60%   This conversation/these recommendations  were discussed with patient primary care team, Dr. Avon Gully  Total Time: 19  Billing based on MDM: High  Problems Addressed: One acute or chronic illness or injury that poses a threat to life or bodily function  Amount and/or Complexity of Data: Category 3:Discussion of management or test interpretation with external physician/other qualified health care professional/appropriate source (not separately reported)  Risks: Decision regarding hospitalization or escalation of hospital care and Decision not to resuscitate or to de-escalate care because of poor prognosis ______________________________________________________ [place] Team Team Cell Phone: [phone] Please utilize secure chat with additional questions, if there is no response within 30 minutes please call the above phone number  Palliative Medicine Team providers are available by phone from 7am to 7pm daily and can be reached through the team cell phone.  Should this patient require assistance outside of these hours, please call the patient's attending physician.

## 2022-02-15 ENCOUNTER — Encounter: Payer: Self-pay | Admitting: *Deleted

## 2022-02-15 ENCOUNTER — Telehealth: Payer: Self-pay | Admitting: *Deleted

## 2022-02-15 LAB — TYPE AND SCREEN
ABO/RH(D): A POS
Antibody Screen: NEGATIVE
Unit division: 0
Unit division: 0

## 2022-02-15 LAB — BPAM RBC
Blood Product Expiration Date: 202401122359
Blood Product Expiration Date: 202401122359
Unit Type and Rh: 6200
Unit Type and Rh: 6200

## 2022-02-15 LAB — LEGIONELLA PNEUMOPHILA SEROGP 1 UR AG: L. pneumophila Serogp 1 Ur Ag: NEGATIVE

## 2022-02-15 NOTE — Patient Outreach (Signed)
Care Coordination [facility] Note Transition Care Management Follow-up Telephone Call Date of discharge and from where: Saturday, 02/13/22 [place]; Subdural hematoma; acute ITP; renal cell CA, possible pneumonia How have you been since you were released from the hospital? "I am doing okay overall.  Still as weak as I can be.  I have a very poor appetite, along with a sore mouth, but I am trying to eat as much as I can and I am drinking the protein shakes.  We made all the medications changes they told us to.  My wife is here at the house with me and she handles just about everything for me, keeps a good eye on me.  Please call her to finish up with your questions."  Per wife/ caregiver [name], on [facility] DPR: "He is doing okay; I think he is so weak because he just can't eat much.  He was in the hospital for a very long time and I am sure he is exhausted.  They did not say anything to use about having a nurse or physical therapist come in to our home.  I will talk to [name] about that when we see him" Any questions or concerns? No  Items Reviewed: Did the pt receive and understand the discharge instructions provided? Yes  Medications obtained and verified? Yes  Other? No  Any new allergies since your discharge? No  Dietary orders reviewed? Yes Do you have support at home? Yes  spouse/ caregiver provides assistance with care needs as indicated/ needed; patient reports he is moderately independent in self-care  Home Care and Equipment/Supplies: Were home health services ordered? No-- appears from review of EHR this was considered but not approved/ ordered; spouse reports she does not believe that patient needs home health services; education provided around  purpose/ rationale for home health services and encouraged her to discuss with care providers If so, what is the name of the agency? N/A  Has the agency set up a time to come to the patient's home? not applicable Were any new equipment or medical  supplies ordered?  No What is the name of the medical supply agency? N/A Were you able to get the supplies/equipment? not applicable Do you have any questions related to the use of the equipment or supplies? No N/A  Functional Questionnaire: (I = Independent and D = Dependent) ADLs: I  spouse/ caregiver provides assistance with care needs as indicated  Bathing/Dressing- I  spouse/ caregiver provides assistance with care needs as indicated  Meal Prep- D  spouse/ caregiver provides assistance with care needs as indicated  Eating- I  Maintaining continence- I  Transferring/Ambulation- I  Managing Meds- I  spouse/ caregiver provides assistance with care needs as indicated  Follow up appointments reviewed:  PCP Hospital f/u appt confirmed? No  Scheduled to see - on - @ [facility] f/u appt confirmed? Yes  Scheduled to see oncology provider on Wednesday 02/17/22 @ 11:00 am Are transportation arrangements needed? No  If their condition worsens, is the pt aware to call PCP or go to the Emergency Dept.? Yes Was the patient provided with contact information for the PCP's office or ED? No- declined; reports already has contact information for all care providers Was to pt encouraged to call back with questions or concerns? Yes  SDOH assessments and interventions completed:   Yes SDOH Interventions Today    Flowsheet Row Most Recent Value  SDOH Interventions   Food Insecurity Interventions Intervention Not Indicated  Transportation  Interventions Intervention Not Indicated  [spouse provides transportation]      Care Coordination Interventions:  Provided education around rationale for scheduling prompt PCP appointment; need for lab work post-hospital discharge; purpose of and process to initiate home health services    Encounter Outcome:  Pt. Visit Completed    Oneta Rack, RN, BSN, CCRN Alumnus RN CM Care Coordination/ Transition of [place] Management [phone]: direct office

## 2022-02-17 ENCOUNTER — Inpatient Hospital Stay: Payer: Medicare Other | Admitting: Oncology

## 2022-02-17 ENCOUNTER — Other Ambulatory Visit: Payer: Self-pay

## 2022-02-17 ENCOUNTER — Inpatient Hospital Stay: Payer: Medicare Other

## 2022-02-17 VITALS — BP 133/76 | HR 88 | Temp 98.1°F | Resp 18 | Wt 164.1 lb

## 2022-02-17 DIAGNOSIS — C7970 Secondary malignant neoplasm of unspecified adrenal gland: Secondary | ICD-10-CM | POA: Diagnosis not present

## 2022-02-17 DIAGNOSIS — Z79899 Other long term (current) drug therapy: Secondary | ICD-10-CM | POA: Diagnosis not present

## 2022-02-17 DIAGNOSIS — C642 Malignant neoplasm of left kidney, except renal pelvis: Secondary | ICD-10-CM

## 2022-02-17 DIAGNOSIS — I6200 Nontraumatic subdural hemorrhage, unspecified: Secondary | ICD-10-CM | POA: Diagnosis not present

## 2022-02-17 DIAGNOSIS — C7800 Secondary malignant neoplasm of unspecified lung: Secondary | ICD-10-CM | POA: Diagnosis not present

## 2022-02-17 DIAGNOSIS — Z905 Acquired absence of kidney: Secondary | ICD-10-CM | POA: Diagnosis not present

## 2022-02-17 DIAGNOSIS — D693 Immune thrombocytopenic purpura: Secondary | ICD-10-CM | POA: Diagnosis not present

## 2022-02-17 DIAGNOSIS — C7889 Secondary malignant neoplasm of other digestive organs: Secondary | ICD-10-CM | POA: Diagnosis not present

## 2022-02-17 LAB — CBC WITH DIFFERENTIAL (CANCER CENTER ONLY)
Abs Immature Granulocytes: 0.39 10*3/uL — ABNORMAL HIGH (ref 0.00–0.07)
Basophils Absolute: 0 10*3/uL (ref 0.0–0.1)
Basophils Relative: 0 %
Eosinophils Absolute: 0.1 10*3/uL (ref 0.0–0.5)
Eosinophils Relative: 1 %
HCT: 34.8 % — ABNORMAL LOW (ref 39.0–52.0)
Hemoglobin: 12.5 g/dL — ABNORMAL LOW (ref 13.0–17.0)
Immature Granulocytes: 5 %
Lymphocytes Relative: 10 %
Lymphs Abs: 0.8 10*3/uL (ref 0.7–4.0)
MCH: 34 pg (ref 26.0–34.0)
MCHC: 35.9 g/dL (ref 30.0–36.0)
MCV: 94.6 fL (ref 80.0–100.0)
Monocytes Absolute: 0.4 10*3/uL (ref 0.1–1.0)
Monocytes Relative: 5 %
Neutro Abs: 6.4 10*3/uL (ref 1.7–7.7)
Neutrophils Relative %: 79 %
Platelet Count: 101 10*3/uL — ABNORMAL LOW (ref 150–400)
RBC: 3.68 MIL/uL — ABNORMAL LOW (ref 4.22–5.81)
RDW: 14.9 % (ref 11.5–15.5)
WBC Count: 8 10*3/uL (ref 4.0–10.5)
nRBC: 0 % (ref 0.0–0.2)

## 2022-02-17 LAB — PREPARE FRESH FROZEN PLASMA: Unit division: 0

## 2022-02-17 LAB — CMP (CANCER CENTER ONLY)
ALT: 93 U/L — ABNORMAL HIGH (ref 0–44)
AST: 58 U/L — ABNORMAL HIGH (ref 15–41)
Albumin: 3.5 g/dL (ref 3.5–5.0)
Alkaline Phosphatase: 55 U/L (ref 38–126)
Anion gap: 6 (ref 5–15)
BUN: 22 mg/dL (ref 8–23)
CO2: 24 mmol/L (ref 22–32)
Calcium: 9.1 mg/dL (ref 8.9–10.3)
Chloride: 96 mmol/L — ABNORMAL LOW (ref 98–111)
Creatinine: 0.84 mg/dL (ref 0.61–1.24)
GFR, Estimated: >60 mL/min (ref >60)
Glucose, Bld: 179 mg/dL — ABNORMAL HIGH (ref 70–99)
Potassium: 4 mmol/L (ref 3.5–5.1)
Sodium: 126 mmol/L — ABNORMAL LOW (ref 135–145)
Total Bilirubin: 0.8 mg/dL (ref 0.3–1.2)
Total Protein: 7.9 g/dL (ref 6.5–8.1)

## 2022-02-17 LAB — TSH: TSH: 2.476 u[IU]/mL (ref 0.350–4.500)

## 2022-02-17 LAB — BPAM FFP
Blood Product Expiration Date: 202312172359
Blood Product Expiration Date: 202312182359
Blood Product Expiration Date: 202312192359
ISSUE DATE / TIME: 202312171131
ISSUE DATE / TIME: 202312181129
Unit Type and Rh: 600
Unit Type and Rh: 6200
Unit Type and Rh: 6200

## 2022-02-17 MED FILL — Coagulation Fact Xa (Recomb) Inact-zhzo For IV Soln 200 MG: INTRAVENOUS | Qty: 10 | Status: AC

## 2022-02-17 NOTE — Progress Notes (Signed)
Hematology and Oncology Follow Up   Frank [name] [id number] 05-24-1940 81 y.o. 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  He initially presented with localized disease in 2020.   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  He presented with a platelet count of less than 5 and subdural hematoma.  He had a complete response to IVIG and dexamethasone.  Prior Therapy:  He is status post left radical nephrectomy completed by Dr. Gloriann Loan on October 18, 2018.  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   Ipilimumab 1 mg/kg and nivolumab 3 mg/kg started on October 01, 2021.  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  Current therapy: Active surveillance.  Interim History: [name] presents today for repeat follow-up.  Since last visit, he was hospitalized between December 9 show December 16 due to subdural hematoma exacerbated by Eliquis and severe thrombocytopenia.  He did not receive any neurosurgical intervention but was treated for presumed ITP with steroids and IVIG with improvement in his platelet count up to 74 on discharge.  Eliquis has been discontinued permanently at this time.  Clinically, he reports slowly improving and has resumed a lot of activities of daily living.  He is ambulating with the help of a cane and occasionally a walker.  He denies any falls or syncope.  His appetite remains poor but is eating better at this time.  He is using boost and Ensure daily.  Medications: Reviewed without changes. Current Outpatient Medications  Medication Sig Dispense Refill   acetaminophen (TYLENOL) 500 MG tablet Take 1,000 mg by mouth every 6 (six) hours as needed for mild pain.     Calcium  Carbonate (CALCIUM 500 PO) Take 500 mg by mouth 2 (two) times daily.     cephALEXin (KEFLEX) 250 MG capsule Take 1 capsule (250 mg total) by mouth 4 (four) times daily for 4 days. 16 capsule 0   denosumab (PROLIA) 60 MG/ML SOSY injection Inject 60 mg into the skin every 6 (six) months.     esomeprazole (NEXIUM) 40 MG capsule Take 1 capsule (40 mg total) by mouth daily before breakfast. 90 capsule 3   Multiple Vitamins-Minerals (CENTRUM MULTI + OMEGA 3 PO) Take 1 tablet by mouth daily.      omega-3 acid ethyl esters (LOVAZA) 1 g capsule Take 2 capsules by mouth twice daily 360 capsule 1   rosuvastatin (CRESTOR) 10 MG tablet Take 1 tablet by mouth once daily 90 tablet 0   solifenacin (VESICARE) 10 MG tablet Take 1 tablet by mouth once daily (Patient taking differently: Take 10 mg by mouth at bedtime.) 90 tablet 0   VITAMIN D PO Take 1,000 Units by mouth daily.     No current facility-administered medications for this visit.     Allergies:  Allergies  Allergen Reactions   Hydrocodone Other (See Comments)    Caused confusion    Monosodium Glutamate Other (See Comments)    Caused headaches   Naproxen Sodium     Mouth blisters with prolonged use.  Left nephrectomy 2020   Other     Perfume - headaches    Physical exam:       Blood pressure 133/76, pulse 88, temperature 98.1 F (36.7 C), temperature source Temporal,  resp. rate 18, weight 164 lb 1.6 oz (74.4 kg), SpO2 97 %.       ECOG 1    General appearance: Comfortable appearing without any discomfort Head: Normocephalic without any trauma Oropharynx: Mucous membranes are moist and pink without any thrush or ulcers. Eyes: Pupils are equal and round reactive to light. Lymph nodes: No cervical, supraclavicular, inguinal or axillary lymphadenopathy.   Heart:regular rate and rhythm.  S1 and S2 without leg edema. Lung: Clear without any rhonchi or wheezes.  No dullness to percussion. Abdomin: Soft, nontender, nondistended with  good bowel sounds.  No hepatosplenomegaly. Musculoskeletal: No joint deformity or effusion.  Full range of motion noted. Neurological: No deficits noted on motor, sensory and deep tendon reflex exam. Skin: No petechial rash or dryness.  Appeared moist.           Lab Results: Lab Results  Component Value Date   WBC 4.6 02/12/2022   HGB 9.8 (L) 02/12/2022   HCT 28.2 (L) 02/12/2022   MCV 95.6 02/12/2022   PLT 74 (L) 02/12/2022   PSA 0.10 01/17/2020     Chemistry      Component Value Date/Time   NA 132 (L) 02/12/2022 0407   NA 135 07/21/2020 1211   K 3.9 02/12/2022 0407   K 3.8 11/09/2013 0000   CL 104 02/12/2022 0407   CL 105 11/09/2013 0000   CO2 21 (L) 02/12/2022 0407   CO2 26 11/09/2013 0000   BUN 26 (H) 02/12/2022 0407   BUN 18 07/21/2020 1211   CREATININE 0.89 02/12/2022 0407   CREATININE 0.92 01/12/2022 0926   CREATININE 0.66 11/09/2013 0000      Component Value Date/Time   CALCIUM 7.3 (L) 02/12/2022 0407   CALCIUM 8.7 11/09/2013 0000   ALKPHOS 37 (L) 02/09/2022 0323   ALKPHOS 55 11/09/2013 0000   AST 37 02/09/2022 0323   AST 17 01/12/2022 0926   ALT 38 02/09/2022 0323   ALT 17 01/12/2022 0926   BILITOT 0.5 02/09/2022 0323   BILITOT 0.6 01/12/2022 0926       Impression and Plan:  81 year old with:   1.  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   He is off treatment at this time due to other issues including subdural hematoma as well as pulmonary complaints.  I recommended updating his staging scan predominantly imaging studies of the chest and determine best course of action.  He has a limited metastatic disease I recommended a period of observation to his other health issues have subsided.     I am not in favor of starting oral targeted therapy at this time such as axitinib or cabozantinib due to his recent hemorrhage and thrombocytopenia.  I do recommend updating his staging scan in the next 4 to 6  weeks and reevaluate.  If he has symptomatic progression from his cancer treatment could be considered.   2.  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  His platelet count is back to baseline over 100.  3.  Autoimmune complications: Immunotherapy has been discontinued with presumed cutaneous manifestation predominantly severe dry mouth and laryngitis.  4.  Subdural hematoma: Clinically stable at this time without intervention.  This was exacerbated recent thrombocytopenia.  5.  Follow-up: In the next 4 to 6 weeks for repeat follow-up.      30  minutes were dedicated to this visit.  The time was spent on updating disease status,  treatment choices and outlining future plan of care discussion.  Zola Button, MD 02/17/2022 10:55 AM

## 2022-02-18 ENCOUNTER — Encounter: Payer: Self-pay | Admitting: Internal Medicine

## 2022-02-18 ENCOUNTER — Ambulatory Visit (INDEPENDENT_AMBULATORY_CARE_PROVIDER_SITE_OTHER): Payer: Medicare Other | Admitting: Internal Medicine

## 2022-02-18 VITALS — BP 128/74 | HR 85 | Temp 97.7°F | Ht 65.0 in | Wt 162.0 lb

## 2022-02-18 DIAGNOSIS — S065XAA Traumatic subdural hemorrhage with loss of consciousness status unknown, initial encounter: Secondary | ICD-10-CM | POA: Diagnosis not present

## 2022-02-18 DIAGNOSIS — D693 Immune thrombocytopenic purpura: Secondary | ICD-10-CM | POA: Diagnosis not present

## 2022-02-18 DIAGNOSIS — I10 Essential (primary) hypertension: Secondary | ICD-10-CM | POA: Diagnosis not present

## 2022-02-18 DIAGNOSIS — Z7901 Long term (current) use of anticoagulants: Secondary | ICD-10-CM

## 2022-02-18 DIAGNOSIS — E871 Hypo-osmolality and hyponatremia: Secondary | ICD-10-CM

## 2022-02-18 DIAGNOSIS — R630 Anorexia: Secondary | ICD-10-CM | POA: Insufficient documentation

## 2022-02-18 MED ORDER — MIRTAZAPINE 7.5 MG PO TABS: 7.5000 mg | ORAL_TABLET | Freq: Every day | ORAL | 3 refills | Status: DC

## 2022-02-18 NOTE — Assessment & Plan Note (Signed)
Persistent, for remeron 7.5 mg qhs,  to f/u any worsening symptoms or concerns

## 2022-02-18 NOTE — Patient Instructions (Signed)
Please take all new medication as prescribed - the remeron 7.5 mg at bedtime for appetite  Please continue all other medications as before, and refills have been done if requested.  Please have the pharmacy call with any other refills you may need.  Please continue your efforts at being more active, low cholesterol diet, and weight control.  Please keep your appointments with your specialists as you may have planned - including Jan appt with Oncology and Dr [name]

## 2022-02-18 NOTE — Assessment & Plan Note (Signed)
Lab Results  Component Value Date   PLT 101 (L) 02/17/2022   Much improved, no worsening bruising or bleeding, to f/u oncology soon

## 2022-02-18 NOTE — Assessment & Plan Note (Signed)
Cont to hold elquis for now and pt declines restart

## 2022-02-18 NOTE — Assessment & Plan Note (Signed)
Stable neuro, for f/u NS as planned

## 2022-02-18 NOTE — Progress Notes (Signed)
Patient ID: Frank Winkles., male   DOB: April 18, 1940, 81 y.o.   MRN: [id number]        Chief Complaint: follow up recent hospn Dec 9 - 16 with SDH and acute ITP as pcp not available       HPI:  Frank Hendriks. is a 81 y.o. male here with wife, pt recently hospd with HA x 1 wk after covid booster shot, then noticed significant bruising to arms, then nausea and epistaxis, while on Eliquis, found to have SDH bilateral without mass effect or midline shift, and platelets < 5.  Tx with plt transfusion, Andexxa, seen per NS and followed, platelets gradually improved, required IV lasix for pulm vascular congestion and hypoxia, Course also complicated by transient encephalopathy, and start Amiodarone for afib with RVR.  Also imaging with ? Pneumonia tx with antibiotic, which family states was just completed yesterday.  Family and pt denies worsening HA, confusion, gait difficulty or falls, bruising or bleeding or other overt blood loss, and Pt denies chest pain, increased sob or doe, wheezing, orthopnea, PND, increased LE swelling, palpitations, dizziness or syncope.  Denies fever, cough.  Had f/u lab dec 20 per oncology with Na 126 (after high of 132 during hospn), Plt 101 - family reports they were informed labs were adequate, Eliquis continues to be held.  Has f/u with oncology next month and plans to re-address the issue of chronic anticoagulation at that time, declines to restart sooner.  Does c/o ongoing fatigue, but denies signficant daytime hypersomnolence.  Has f/u appt planned with oncology, cardiology, and NS next month.  Most important to family is pt lower appetite, asking for tx.  Denies worsening depressive symptoms, suicidal ideation, or panic       Wt Readings from Last 3 Encounters:  02/18/22 162 lb (73.5 kg)  02/17/22 164 lb 1.6 oz (74.4 kg)  02/12/22 170 lb 10.2 oz (77.4 kg)   BP Readings from Last 3 Encounters:  02/18/22 128/74  02/17/22 133/76  02/13/22 125/72         Past  Medical History:  Diagnosis Date   Abdominal aortic aneurysm (AAA) without rupture 06/23/2018   Age-related osteoporosis without current pathological fracture 07/14/2021   BPH (benign prostatic hyperplasia) 01/19/2010   Cervical radiculopathy 06/16/2018   Chronic anticoagulation 08/22/2018   Chronic bilateral low back pain without sciatica 07/18/2019   Chronic sinusitis 05/18/2021   Diverticulosis    Erectile dysfunction due to arterial insufficiency 04/23/2015   Esophageal stricture    Essential hypertension 05/01/2018   Estimated Creatinine Clearance: 44.1 mL/min (by C-G formula based on SCr of 1.35 mg/dL).   GERD (gastroesophageal reflux disease) 11/10/2010   Hearing loss    Hiatal hernia    Hyperlipidemia with target LDL less than 70 11/28/2012   The 10-year ASCVD risk score Frank Bussing DC Jr., et al., 2013) is: 30.7%   Values used to calculate the score:     Age: 64 years     Sex: Male     Is Non-Hispanic African American: No     Diabetic: No     Tobacco smoker: No     Systolic Blood Pressure: 621 mmHg     Is BP treated: No     HDL Cholesterol: 30.8 mg/dL     Total Cholesterol: 183 mg/dL   Hypoglycemia    Iron deficiency anemia due to dietary causes 12/07/2018   Lung nodule seen on imaging study 06/23/2018   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 08/22/2018    a Medtronic Azure XT MRI conditional dual-chamber pacemaker for symptomatic sinus pauses, sick sinus syndrome, and syncope   Migraine headache    OAB (overactive bladder) 01/06/2016   PAF (paroxysmal atrial fibrillation)    Echo with normal LV function and mild LVH   Prediabetes 12/06/2018   Pure hyperglyceridemia 11/22/2007   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   Estimated  Creatinine Clearance: 51.7 mL/min (by C-G formula based on SCr of 1.15 mg/dL).   Syncope    presented with atrial fib converted with Diltiazem   Systemic inflammatory response syndrome 06/26/2018   Thrombocytopenia 01/19/2021   Past Surgical History:  Procedure Laterality Date   CYSTOSCOPY WITH URETEROSCOPY AND STENT PLACEMENT Left 08/16/2018   Procedure: CYSTOSCOPY WITH LEFT RETROGRADE PYELOGRAM, BALLOON DILATION LEFT URETER, STENT PLACEMENT;  Surgeon: Lucas Mallow, Frank [name];  Location: [facility];  Service: Urology;  Laterality: Left;   HERNIA REPAIR     INSERT / REPLACE / REMOVE PACEMAKER     LAPAROSCOPIC ASSISTED VENTRAL HERNIA REPAIR N/A 10/18/2018   Procedure: LAPAROSCOPIC ASSISTED VENTRAL WALL HERNIA REPAIR WITH MESH;  Surgeon: Michael Boston, Frank [name];  Location: [facility];  Service: General;  Laterality: N/A;   LAPAROSCOPIC NEPHRECTOMY, HAND ASSISTED Left 10/18/2018   Procedure: LEFT HAND ASSISTED LAPAROSCOPIC RADICAL NEPHRECTOMY WITH ADRENALECTOMY;  Surgeon: Lucas Mallow, Frank [name];  Location: [facility];  Service: Urology;  Laterality: Left;   LOOP RECORDER INSERTION N/A 04/26/2018   Procedure: LOOP RECORDER INSERTION;  Surgeon: Thompson Grayer, Frank [name];  Location: [place] [facility];  Service: Cardiovascular;  Laterality: N/A;   LOOP RECORDER REMOVAL N/A 07/18/2018   Procedure: LOOP RECORDER REMOVAL;  Surgeon: Thompson Grayer, Frank [name];  Location: [place] [facility];  Service: Cardiovascular;  Laterality: N/A;   NASAL SEPTUM SURGERY     PACEMAKER IMPLANT N/A 07/18/2018   Procedure: PACEMAKER IMPLANT;  Surgeon: Thompson Grayer, Frank [name];  Location: [name] [facility];  Service: Cardiovascular;  Laterality: N/A;   TONSILLECTOMY     TONSILLECTOMY     UMBILICAL HERNIA REPAIR  1993   WISDOM TOOTH EXTRACTION     about 81 years old    reports that he quit smoking about 34 years ago. His smoking use included cigarettes. He has never used smokeless tobacco. He reports that he does not drink alcohol and does not use drugs. family  history includes Bipolar disorder in his paternal grandmother and sister; Dementia in his paternal grandmother; Emphysema in his father; Gallbladder disease in his sister; Memory loss in his paternal grandmother and sister. Allergies  Allergen Reactions   Hydrocodone Other (See Comments)    Caused confusion    Monosodium Glutamate Other (See Comments)    Caused headaches   Naproxen Sodium     Mouth blisters with prolonged use.  Left nephrectomy 2020   Other     Perfume - headaches    Current Outpatient Medications on File Prior to Visit  Medication Sig Dispense Refill   acetaminophen (TYLENOL) 500 MG tablet Take 1,000 mg by mouth every 6 (six) hours as needed for mild pain.     Calcium Carbonate (CALCIUM 500 PO) Take 500 mg by mouth 2 (two) times daily.     denosumab (PROLIA) 60 MG/ML SOSY  injection Inject 60 mg into the skin every 6 (six) months.     esomeprazole (NEXIUM) 40 MG capsule Take 1 capsule (40 mg total) by mouth daily before breakfast. 90 capsule 3   Multiple Vitamins-Minerals (CENTRUM MULTI + OMEGA 3 PO) Take 1 tablet by mouth daily.      omega-3 acid ethyl esters (LOVAZA) 1 g capsule Take 2 capsules by mouth twice daily 360 capsule 1   rosuvastatin (CRESTOR) 10 MG tablet Take 1 tablet by mouth once daily 90 tablet 0   solifenacin (VESICARE) 10 MG tablet Take 1 tablet by mouth once daily (Patient taking differently: Take 10 mg by mouth at bedtime.) 90 tablet 0   VITAMIN D PO Take 1,000 Units by mouth daily.     No current facility-administered medications on file prior to visit.        ROS:  All others reviewed and negative.  Objective        PE:  BP 128/74 (BP Location: Left Arm, Patient Position: Sitting, Cuff Size: Large)   Pulse 85   Temp 97.7 F (36.5 C) (Oral)   Ht 5\' 5"  (1.651 m)   Wt 162 lb (73.5 kg)   SpO2 96%   BMI 26.96 kg/m                 Constitutional: Pt appears in NAD               HENT: Head: NCAT.                Right Ear: External ear normal.                  Left Ear: External ear normal.  Marked bilateral reduced hearing despite bilateral hearing aids               Eyes: . Pupils are equal, round, and reactive to light. Conjunctivae and EOM are normal               Nose: without d/c or deformity               Neck: Neck supple. Gross normal ROM               Cardiovascular: Normal rate and regular rhythm.                 Pulmonary/Chest: Effort normal and breath sounds without rales or wheezing.                Abd:  Soft, NT, ND, + BS, no organomegaly               Neurological: Pt is alert. At baseline orientation, motor grossly intact               Skin: Skin is warm. No rashes, no other new lesions, LE edema - none               Psychiatric: Pt behavior is normal without agitation   Micro: none  Cardiac tracings I have personally interpreted today:  none  Pertinent Radiological findings (summarize): none   Lab Results  Component Value Date   WBC 8.0 02/17/2022   HGB 12.5 (L) 02/17/2022   HCT 34.8 (L) 02/17/2022   PLT 101 (L) 02/17/2022   GLUCOSE 179 (H) 02/17/2022   CHOL 117 01/19/2021   TRIG 166.0 (H) 01/19/2021   HDL 32.80 (L) 01/19/2021   LDLDIRECT 59.0 01/17/2020   LDLCALC 51 01/19/2021   ALT  93 (H) 02/17/2022   AST 58 (H) 02/17/2022   NA 126 (L) 02/17/2022   K 4.0 02/17/2022   CL 96 (L) 02/17/2022   CREATININE 0.84 02/17/2022   BUN 22 02/17/2022   CO2 24 02/17/2022   TSH 2.476 02/17/2022   PSA 0.10 01/17/2020   INR 1.4 (H) 02/12/2022   HGBA1C 5.9 01/19/2021   Assessment/Plan:  Frank Sobolewski. is a 81 y.o. White or Caucasian [1] male with  has a past medical history of Abdominal aortic aneurysm (AAA) without rupture (06/23/2018), Age-related osteoporosis without current pathological fracture (07/14/2021), BPH (benign prostatic hyperplasia) (01/19/2010), Cervical radiculopathy (06/16/2018), Chronic anticoagulation (08/22/2018), Chronic bilateral low back pain without sciatica (07/18/2019), Chronic  sinusitis (05/18/2021), Diverticulosis, Erectile dysfunction due to arterial insufficiency (04/23/2015), Esophageal stricture, Essential hypertension (05/01/2018), GERD (gastroesophageal reflux disease) (11/10/2010), Hearing loss, Hiatal hernia, Hyperlipidemia with target LDL less than 70 (11/28/2012), Hypoglycemia, Iron 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  Frank Cower, Frank [name] 02/18/2022 8:58 PM [facility]

## 2022-02-18 NOTE — Addendum Note (Signed)
Addended by: Biagio Borg on: 02/18/2022 08:59 PM   Modules accepted: Level of Service

## 2022-02-18 NOTE — Assessment & Plan Note (Signed)
BP Readings from Last 3 Encounters:  02/18/22 128/74  02/17/22 133/76  02/13/22 125/72   Stable, pt to continue medical treatment  - diet, wt control, excercise

## 2022-02-18 NOTE — Assessment & Plan Note (Addendum)
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

## 2022-03-08 ENCOUNTER — Inpatient Hospital Stay: Payer: Medicare Other | Attending: Oncology | Admitting: Nurse Practitioner

## 2022-03-08 ENCOUNTER — Encounter: Payer: Self-pay | Admitting: Nurse Practitioner

## 2022-03-08 ENCOUNTER — Other Ambulatory Visit: Payer: Self-pay

## 2022-03-08 VITALS — BP 143/84 | HR 93 | Temp 98.3°F | Resp 16 | Wt 160.1 lb

## 2022-03-08 DIAGNOSIS — B370 Candidal stomatitis: Secondary | ICD-10-CM | POA: Diagnosis not present

## 2022-03-08 DIAGNOSIS — C7800 Secondary malignant neoplasm of unspecified lung: Secondary | ICD-10-CM | POA: Insufficient documentation

## 2022-03-08 DIAGNOSIS — K5900 Constipation, unspecified: Secondary | ICD-10-CM | POA: Diagnosis not present

## 2022-03-08 DIAGNOSIS — C642 Malignant neoplasm of left kidney, except renal pelvis: Secondary | ICD-10-CM | POA: Insufficient documentation

## 2022-03-08 DIAGNOSIS — Z905 Acquired absence of kidney: Secondary | ICD-10-CM | POA: Insufficient documentation

## 2022-03-08 DIAGNOSIS — R530 Neoplastic (malignant) related fatigue: Secondary | ICD-10-CM

## 2022-03-08 DIAGNOSIS — R634 Abnormal weight loss: Secondary | ICD-10-CM

## 2022-03-08 DIAGNOSIS — K117 Disturbances of salivary secretion: Secondary | ICD-10-CM

## 2022-03-08 DIAGNOSIS — Z7901 Long term (current) use of anticoagulants: Secondary | ICD-10-CM | POA: Insufficient documentation

## 2022-03-08 DIAGNOSIS — C649 Malignant neoplasm of unspecified kidney, except renal pelvis: Secondary | ICD-10-CM | POA: Diagnosis not present

## 2022-03-08 DIAGNOSIS — Z515 Encounter for palliative care: Secondary | ICD-10-CM

## 2022-03-08 DIAGNOSIS — D693 Immune thrombocytopenic purpura: Secondary | ICD-10-CM | POA: Insufficient documentation

## 2022-03-08 DIAGNOSIS — C7970 Secondary malignant neoplasm of unspecified adrenal gland: Secondary | ICD-10-CM | POA: Insufficient documentation

## 2022-03-08 DIAGNOSIS — R682 Dry mouth, unspecified: Secondary | ICD-10-CM | POA: Insufficient documentation

## 2022-03-08 DIAGNOSIS — Z79899 Other long term (current) drug therapy: Secondary | ICD-10-CM | POA: Insufficient documentation

## 2022-03-08 MED ORDER — NYSTATIN 100000 UNIT/ML MT SUSP: 5.0000 mL | Freq: Three times a day (TID) | OROMUCOSAL | 1 refills | Status: DC | PRN

## 2022-03-08 NOTE — Progress Notes (Signed)
[place]  [phone] [phone]) [phone]   Name: Sherron Mummert. Date: 03/08/2022 MRN: [id number]  DOB: 08-15-1940  Patient Care Team: [name], MD as PCP - General Nahser, Wonda Cheng, MD as PCP - Cardiology (Cardiology) Thompson Grayer, MD as PCP - Electrophysiology (Cardiology) Irene Shipper, MD as Consulting Physician (Gastroenterology) Michael Boston, MD as Consulting Physician (General Surgery) Thompson Grayer, MD as Consulting Physician (Cardiology) Lucas Mallow, MD as Consulting Physician (Urology) Wyatt Portela, MD as Consulting Physician (Oncology) Tomasa Blase, [facility] (Inactive) (Pharmacist)    REASON FOR CONSULTATION: Deaunte Dente. is a 82 y.o. 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  He was also noted to have pancreatic involvement. PMHx significant for HTN, HLD, PAF (on Eliquis), sick sinus syndrome (pacemaker in situ), AAA, CKD stage IIIb, BPH, GERD, diverticulosis. Palliative ask to see for symptom management and goals of care.    SOCIAL HISTORY:     reports that he quit smoking about 35 years ago. His smoking use included cigarettes. He has never used smokeless tobacco. He reports that he does not drink alcohol and does not use drugs.  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   GERD (gastroesophageal reflux disease) 11/10/2010   Hearing loss    Hiatal hernia    Hyperlipidemia with target LDL less than 70 11/28/2012   The 10-year ASCVD risk score Mikey Bussing DC Jr., et al., 2013) is: 30.7%   Values used to calculate the score:     Age: 57 years     Sex: Male     Is Non-Hispanic African American: No     Diabetic: No     Tobacco smoker: No     Systolic Blood Pressure: 147 mmHg     Is BP treated: No     HDL Cholesterol: 30.8 mg/dL     Total Cholesterol: 183 mg/dL   Hypoglycemia    Iron deficiency anemia due to dietary causes 12/07/2018   Lung nodule seen on imaging study 06/23/2018   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   Estimated Creatinine Clearance: 51.7 mL/min (by C-G formula based on SCr of 1.15 mg/dL).  Syncope    presented with atrial fib converted with Diltiazem   Systemic inflammatory response syndrome 06/26/2018   Thrombocytopenia 01/19/2021    PAST SURGICAL HISTORY:  Past Surgical History:  Procedure Laterality Date   CYSTOSCOPY WITH URETEROSCOPY AND STENT PLACEMENT Left 08/16/2018   Procedure: CYSTOSCOPY WITH LEFT RETROGRADE PYELOGRAM, BALLOON DILATION LEFT URETER, STENT PLACEMENT;  Surgeon: Lucas Mallow, MD;  Location: [facility];  Service: Urology;  Laterality: Left;   HERNIA REPAIR     INSERT / REPLACE / REMOVE PACEMAKER     LAPAROSCOPIC ASSISTED VENTRAL HERNIA REPAIR N/A 10/18/2018   Procedure: LAPAROSCOPIC ASSISTED VENTRAL WALL HERNIA REPAIR WITH MESH;  Surgeon: Michael Boston, MD;  Location: [facility];  Service: General;  Laterality: N/A;   LAPAROSCOPIC NEPHRECTOMY, HAND ASSISTED Left 10/18/2018   Procedure: LEFT HAND ASSISTED LAPAROSCOPIC RADICAL NEPHRECTOMY WITH ADRENALECTOMY;  Surgeon: Lucas Mallow, MD;  Location: [facility];  Service: Urology;  Laterality: Left;   LOOP RECORDER INSERTION N/A 04/26/2018   Procedure: LOOP RECORDER INSERTION;  Surgeon: Thompson Grayer, MD;  Location: [place] [facility];  Service: Cardiovascular;  Laterality: N/A;   LOOP RECORDER REMOVAL N/A 07/18/2018   Procedure: LOOP RECORDER REMOVAL;  Surgeon: Thompson Grayer, MD;  Location: [place] [facility];  Service: Cardiovascular;  Laterality: N/A;   NASAL SEPTUM SURGERY     PACEMAKER IMPLANT N/A 07/18/2018   Procedure: PACEMAKER IMPLANT;  Surgeon: Thompson Grayer, MD;  Location: [place] [facility];  Service: Cardiovascular;  Laterality: N/A;   TONSILLECTOMY     TONSILLECTOMY     UMBILICAL HERNIA REPAIR  1993   WISDOM TOOTH EXTRACTION     about 82 years old    HEMATOLOGY/ONCOLOGY HISTORY:  Oncology History  Renal cell carcinoma ([place])  01/15/2019 Initial Diagnosis   Renal cell carcinoma of left kidney   10/01/2021 - 10/01/2021 Chemotherapy   Patient is on Treatment Plan : RENAL CELL CARCINOMA Nivolumab + Ipilimumab q21d / Nivolumab q28d     10/01/2021 -  Chemotherapy   Patient is on Treatment Plan : RENAL CELL CARCINOMA Nivolumab (3) + Ipilimumab (1) q21d / Nivolumab (240) q14d       ALLERGIES:  is allergic to hydrocodone, monosodium glutamate, naproxen sodium, and other.  MEDICATIONS:  Current Outpatient Medications  Medication Sig Dispense Refill   acetaminophen (TYLENOL) 500 MG tablet Take 1,000 mg by mouth  every 6 (six) hours as needed for mild pain.     Calcium Carbonate (CALCIUM 500 PO) Take 500 mg by mouth 2 (two) times daily.     denosumab (PROLIA) 60 MG/ML SOSY injection Inject 60 mg into the skin every 6 (six) months.     esomeprazole (NEXIUM) 40 MG capsule Take 1 capsule (40 mg total) by mouth daily before breakfast. 90 capsule 3   mirtazapine (REMERON) 7.5 MG tablet Take 1 tablet (7.5 mg total) by mouth at bedtime. 90 tablet 3   Multiple Vitamins-Minerals (CENTRUM MULTI + OMEGA 3 PO) Take 1 tablet by mouth daily.      omega-3 acid ethyl esters (LOVAZA) 1 g capsule Take 2 capsules by mouth twice daily 360 capsule 1   rosuvastatin (CRESTOR) 10 MG tablet Take 1 tablet by mouth once daily 90 tablet 0   solifenacin (VESICARE) 10 MG tablet Take 1 tablet by mouth once daily (Patient taking differently: Take 10 mg by mouth at bedtime.) 90 tablet 0   VITAMIN D PO Take 1,000 Units by mouth daily.     No current  facility-administered medications for this visit.    VITAL SIGNS: There were no vitals taken for this visit. There were no vitals filed for this visit.  Estimated body mass index is 26.96 kg/m as calculated from the following:   Height as of 02/18/22: 5\' 5"  (1.651 m).   Weight as of 02/18/22: 162 lb (73.5 kg).  LABS: CBC:    Component Value Date/Time   WBC 8.0 02/17/2022 1054   WBC 4.6 02/12/2022 0407   HGB 12.5 (L) 02/17/2022 1054   HGB 13.5 07/21/2020 1211   HGB 15.1 11/09/2013 0000   HCT 34.8 (L) 02/17/2022 1054   HCT 37.9 07/21/2020 1211   HCT 44 11/09/2013 0000   PLT 101 (L) 02/17/2022 1054   PLT 110 (L) 07/21/2020 1211   MCV 94.6 02/17/2022 1054   MCV 94 07/21/2020 1211   NEUTROABS 6.4 02/17/2022 1054   LYMPHSABS 0.8 02/17/2022 1054   MONOABS 0.4 02/17/2022 1054   EOSABS 0.1 02/17/2022 1054   BASOSABS 0.0 02/17/2022 1054   Comprehensive Metabolic Panel:    Component Value Date/Time   NA 126 (L) 02/17/2022 1054   NA 135 07/21/2020 1211   K 4.0 02/17/2022 1054    K 3.8 11/09/2013 0000   CL 96 (L) 02/17/2022 1054   CL 105 11/09/2013 0000   CO2 24 02/17/2022 1054   CO2 26 11/09/2013 0000   BUN 22 02/17/2022 1054   BUN 18 07/21/2020 1211   CREATININE 0.84 02/17/2022 1054   CREATININE 0.66 11/09/2013 0000   GLUCOSE 179 (H) 02/17/2022 1054   CALCIUM 9.1 02/17/2022 1054   CALCIUM 8.7 11/09/2013 0000   AST 58 (H) 02/17/2022 1054   ALT 93 (H) 02/17/2022 1054   ALKPHOS 55 02/17/2022 1054   ALKPHOS 55 11/09/2013 0000   BILITOT 0.8 02/17/2022 1054   PROT 7.9 02/17/2022 1054   PROT 6.4 10/12/2017 0729   ALBUMIN 3.5 02/17/2022 1054   ALBUMIN 4.2 10/12/2017 0729    RADIOGRAPHIC STUDIES: DG Chest Port 1 View  Result Date: 02/12/2022 CLINICAL DATA:  Hypoxia.  History of subdural hematoma. EXAM: PORTABLE CHEST 1 VIEW COMPARISON:  Chest radiograph 02/10/2022 FINDINGS: Left chest dual chamber cardiac pacemaker. Slightly improved aeration in both lungs could represent decreased interstitial edema. There continues to be prominent lung markings bilaterally. Patient has known chronic lung changes from previous chest CT on 11/24/2021. Heart size is upper limits of normal but stable. Negative for a pneumothorax. Atherosclerotic calcifications at the aortic arch. IMPRESSION: 1. Slightly improved aeration in both lungs could represent decreased edema or decreasing airspace disease. 2. Persistent prominent lung markings most likely associated with chronic changes. Electronically Signed   By: Markus Daft M.D.   On: 02/12/2022 07:57   CT HEAD WO CONTRAST (5MM)  Result Date: 02/10/2022 CLINICAL DATA:  Mental status change with unknown cause EXAM: CT HEAD WITHOUT CONTRAST TECHNIQUE: Contiguous axial images were obtained from the base of the skull through the vertex without intravenous contrast. RADIATION DOSE REDUCTION: This exam was performed according to the departmental dose-optimization program which includes automated exposure control, adjustment of the mA and/or kV  according to patient size and/or use of iterative reconstruction technique. COMPARISON:  Head CT from 3 days ago FINDINGS: Brain: Patchy lobulated mixed density subdural hematoma along the bilateral cerebral convexities and at the inter hemispheric fissure with even milder involvement along the bilateral tentorium. Different scan angle on axial images, no detected progression when compared on coronal reformats. Maximal thickness measures up to 7 mm  at the level of the mid falx. No cerebral mass effect, infarct, hydrocephalus, or masslike finding Vascular: No hyperdense vessel or unexpected calcification. Skull: Normal. Negative for fracture or focal lesion. Sinuses/Orbits: No acute finding. IMPRESSION: No progression of bilateral subdural hematoma not causing cerebral mass effect. Electronically Signed   By: Jorje Guild M.D.   On: 02/10/2022 11:02    PERFORMANCE STATUS (ECOG) : 1 - Symptomatic but completely ambulatory  Review of Systems  Constitutional:  Positive for appetite change, fatigue and unexpected weight change.  Unless otherwise noted, a complete review of systems is negative.  Physical Exam General: NAD HEENT: oral tenderness, tongue redness, left mandible nodule (nonpainful) Cardiovascular: regular rate and rhythm Pulmonary: normal breathing pattern Extremities: no edema, no joint deformities Skin: no rashes Neurological:AAO x3, mood appropriate   IMPRESSION: This is my initial visit with patient. He was initially seen during recent hospitalization by our team. No acute distress noted. Patient's wife, [name] is also present. He is ambulatory without assistive devices.   I introduced myself, [place] RN, and Palliative's role in collaboration with the oncology team. Concept of Palliative Care was introduced as specialized medical care for people and their families living with serious illness.  It focuses on providing relief from the symptoms and stress of a serious illness.  The goal  is to improve quality of life for both the patient and the family. Values and goals of care important to patient and family were attempted to be elicited.   Mr. Lotz and his wife have been married for over 48 years.  They had 1 daughter who unfortunately passed away over a year ago.  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  Patient is able to perform all ADLs independently.  He continues to remain as active as possible.  Denies any nausea, vomiting, diarrhea, or pain.  He does endorse occasional constipation.  He is taking MiraLAX however does not take consistently on a daily basis.  Education provided on using daily for better bowel management.  Patient and wife expressed concerns about decreased appetite, weight loss, and xerostomia. Mr. Sass shares he has been trying different treatments such as Biotene with little to no improvement.  He is also scheduled to see his dentist later today due to complications with a tooth and will also further discussed with him.  He does feel like this may impact his appetite at times.  We have discussed ways to increase protein.  He is drinking daily Ensure.  His current weight is 160 pounds down from 162 pounds on 12/21, 170 pounds on 12/15, 176 pounds on 12/13.  He is taking mirtazapine 7.5 mg at bedtime.  Has not noticed much of an improvement.  Recommended he continue to take consistently over the next 1-2 weeks.  If no improvement we will consider increasing dose versus trying a different medication.  We discussed his dry mouth.  Patient does have beefy red tongue with some tenderness orally.  No oral ulcerations noted.  No obvious signs of thrush.  We discussed use of Magic mouthwash as well as sugarless lozenges/citric water.  Patient may also try aloe vera water as well as saliva substitutes which can be bought over-the-counter.  We discussed his current illness  and what it means in the larger context of his on-going co-morbidities.  Mr. Cawley and his wife continues to treat the treatable.  They are remaining hopeful for continued  stability allowing patient to have the best quality of life and continue to thrive.  I discussed the importance of continued conversation with family and their medical providers regarding overall plan of care and treatment options, ensuring decisions are within the context of the patients values and GOCs.  PLAN: Established therapeutic relationship. Education provided on palliative's role in collaboration with their Oncology/Radiation team. Continue mirtazapine 7.5 mg at bedtime Education provided on home options to assist in discomfort from xerostomia. Magic mouthwash swish and swallow as needed. Ongoing symptom management support. Education provided on nutrition.  Patient would benefit from dietitian consult for additional support. I will plan to see patient back in 2-4 weeks in collaboration to other oncology appointments.    Patient expressed understanding and was in agreement with this plan. He also understands that He can call the clinic at any time with any questions, concerns, or complaints.   Thank you for your referral and allowing Palliative to assist in Mr. [name] Jr.'s care.   Number and complexity of problems addressed: HIGH - 1 or more chronic illnesses with SEVERE exacerbation, progression, or side effects of treatment - advanced cancer, pain. Any controlled substances utilized were prescribed in the context of palliative care.  Time Total: 50 min  Visit consisted of counseling and education dealing with the complex and emotionally intense issues of symptom management and palliative care in the setting of serious and potentially life-threatening illness.Greater than 50%  of this time was spent counseling and coordinating care related to the above assessment and plan.  Signed by: Alda Lea, AGPCNP-BC Palliative Medicine Team/[place]

## 2022-03-26 ENCOUNTER — Telehealth: Payer: Self-pay | Admitting: Internal Medicine

## 2022-03-26 NOTE — Telephone Encounter (Signed)
Spoke with patient, patient concerned about AF and being taken off Eliquis due to subdural hematoma 02/06/22, had patient send in transmission 1 new AF episode since 02/13/2022 <30seconds in duration, patient appreciative of call back

## 2022-03-26 NOTE — Telephone Encounter (Signed)
Pt would like a callback regarding remote check results. Please advise

## 2022-03-29 ENCOUNTER — Ambulatory Visit (HOSPITAL_COMMUNITY)
Admission: RE | Admit: 2022-03-29 | Discharge: 2022-03-29 | Disposition: A | Discharge status: Home / Self Care | Payer: Medicare Other | Source: Ambulatory Visit | Attending: Oncology | Admitting: Oncology

## 2022-03-29 DIAGNOSIS — J439 Emphysema, unspecified: Secondary | ICD-10-CM | POA: Diagnosis not present

## 2022-03-29 DIAGNOSIS — C642 Malignant neoplasm of left kidney, except renal pelvis: Secondary | ICD-10-CM | POA: Diagnosis not present

## 2022-03-29 DIAGNOSIS — N281 Cyst of kidney, acquired: Secondary | ICD-10-CM | POA: Diagnosis not present

## 2022-03-29 DIAGNOSIS — C7970 Secondary malignant neoplasm of unspecified adrenal gland: Secondary | ICD-10-CM | POA: Diagnosis not present

## 2022-03-29 MED ORDER — SODIUM CHLORIDE (PF) 0.9 % IJ SOLN
INTRAMUSCULAR | Status: AC
  Filled 2022-03-29: qty 50

## 2022-03-29 MED ORDER — IOHEXOL 300 MG/ML  SOLN
80.0000 mL | Freq: Once | INTRAMUSCULAR | Status: AC | PRN
  Administered 2022-03-29: 80 mL via INTRAVENOUS

## 2022-03-30 ENCOUNTER — Inpatient Hospital Stay: Payer: Medicare Other

## 2022-03-30 ENCOUNTER — Inpatient Hospital Stay: Payer: Medicare Other | Admitting: Physician Assistant

## 2022-03-30 VITALS — BP 120/65 | HR 70 | Temp 97.6°F | Resp 18 | Wt 164.2 lb

## 2022-03-30 DIAGNOSIS — C642 Malignant neoplasm of left kidney, except renal pelvis: Secondary | ICD-10-CM

## 2022-03-30 DIAGNOSIS — K5900 Constipation, unspecified: Secondary | ICD-10-CM | POA: Diagnosis not present

## 2022-03-30 DIAGNOSIS — Z905 Acquired absence of kidney: Secondary | ICD-10-CM | POA: Diagnosis not present

## 2022-03-30 DIAGNOSIS — Z7901 Long term (current) use of anticoagulants: Secondary | ICD-10-CM | POA: Diagnosis not present

## 2022-03-30 DIAGNOSIS — B370 Candidal stomatitis: Secondary | ICD-10-CM | POA: Diagnosis not present

## 2022-03-30 DIAGNOSIS — C649 Malignant neoplasm of unspecified kidney, except renal pelvis: Secondary | ICD-10-CM

## 2022-03-30 DIAGNOSIS — D696 Thrombocytopenia, unspecified: Secondary | ICD-10-CM

## 2022-03-30 DIAGNOSIS — C7800 Secondary malignant neoplasm of unspecified lung: Secondary | ICD-10-CM | POA: Diagnosis not present

## 2022-03-30 DIAGNOSIS — D693 Immune thrombocytopenic purpura: Secondary | ICD-10-CM | POA: Diagnosis not present

## 2022-03-30 DIAGNOSIS — C7970 Secondary malignant neoplasm of unspecified adrenal gland: Secondary | ICD-10-CM | POA: Diagnosis not present

## 2022-03-30 DIAGNOSIS — R682 Dry mouth, unspecified: Secondary | ICD-10-CM | POA: Diagnosis not present

## 2022-03-30 DIAGNOSIS — Z79899 Other long term (current) drug therapy: Secondary | ICD-10-CM | POA: Diagnosis not present

## 2022-03-30 LAB — CBC WITH DIFFERENTIAL (CANCER CENTER ONLY)
Abs Immature Granulocytes: 0.02 10*3/uL (ref 0.00–0.07)
Basophils Absolute: 0 10*3/uL (ref 0.0–0.1)
Basophils Relative: 0 %
Eosinophils Absolute: 0.1 10*3/uL (ref 0.0–0.5)
Eosinophils Relative: 2 %
HCT: 39.1 % (ref 39.0–52.0)
Hemoglobin: 13.3 g/dL (ref 13.0–17.0)
Immature Granulocytes: 0 %
Lymphocytes Relative: 18 %
Lymphs Abs: 1 10*3/uL (ref 0.7–4.0)
MCH: 32 pg (ref 26.0–34.0)
MCHC: 34 g/dL (ref 30.0–36.0)
MCV: 94 fL (ref 80.0–100.0)
Monocytes Absolute: 0.5 10*3/uL (ref 0.1–1.0)
Monocytes Relative: 10 %
Neutro Abs: 3.8 10*3/uL (ref 1.7–7.7)
Neutrophils Relative %: 70 %
Platelet Count: 106 10*3/uL — ABNORMAL LOW (ref 150–400)
RBC: 4.16 MIL/uL — ABNORMAL LOW (ref 4.22–5.81)
RDW: 14.1 % (ref 11.5–15.5)
WBC Count: 5.5 10*3/uL (ref 4.0–10.5)
nRBC: 0 % (ref 0.0–0.2)

## 2022-03-30 LAB — CMP (CANCER CENTER ONLY)
ALT: 18 U/L (ref 0–44)
AST: 19 U/L (ref 15–41)
Albumin: 3.9 g/dL (ref 3.5–5.0)
Alkaline Phosphatase: 43 U/L (ref 38–126)
Anion gap: 4 — ABNORMAL LOW (ref 5–15)
BUN: 14 mg/dL (ref 8–23)
CO2: 26 mmol/L (ref 22–32)
Calcium: 9.6 mg/dL (ref 8.9–10.3)
Chloride: 104 mmol/L (ref 98–111)
Creatinine: 0.96 mg/dL (ref 0.61–1.24)
GFR, Estimated: >60 mL/min (ref >60)
Glucose, Bld: 96 mg/dL (ref 70–99)
Potassium: 4.2 mmol/L (ref 3.5–5.1)
Sodium: 134 mmol/L — ABNORMAL LOW (ref 135–145)
Total Bilirubin: 0.4 mg/dL (ref 0.3–1.2)
Total Protein: 7.5 g/dL (ref 6.5–8.1)

## 2022-03-30 LAB — TSH: TSH: 2.096 u[IU]/mL (ref 0.350–4.500)

## 2022-03-30 MED ORDER — NYSTATIN 100000 UNIT/ML MT SUSP: 5.0000 mL | Freq: Three times a day (TID) | OROMUCOSAL | 1 refills | Status: DC | PRN

## 2022-03-30 NOTE — Progress Notes (Signed)
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  He initially presented with localized disease in 2020.  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  He presented with a platelet count of less than 5 and subdural hematoma.  He had a complete response to IVIG and dexamethasone.   PRIOR THERAPY: He is status post left radical nephrectomy completed by Dr. Gloriann Loan on October 18, 2018.  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    Ipilimumab 1 mg/kg and nivolumab 3 mg/kg started on October 01, 2021.  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  CURRENT THERAPY: Active surveillance   INTERVAL HISTORY: [name] 82 y.o. male returns to the clinic today for a follow-up visit.  The patient was last seen by Dr. [name] on 02/17/2022.  The patient has a complicated medical history.  He is following with Dr. [name] for stage IV renal cell carcinoma.  He was initially treated and diagnosed in 2020 status post left radical nephrectomy but had pulmonary involvement in 2023.  He then was started on systemic treatment with ipilimumab and nivolumab. He had 2 cycles but treatment is on hold secondary to autoimmune complications since August 2023.   On February 06, 2022 the patient was in the hospital for subdural hematoma exacerbated by Eliquis and severe thrombocytopenia. He also had increased bruising on his arms. his platelet count was severely low at less than 5000.  He received 2 units of platelets.  CT showed bilateral subdural hematomas.  He had received a COVID booster vaccine approximately 1 week prior to new onset of headaches. 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  No surgical intervention was recommended due to the patient thrombocytopenia.  The patient received IVIG and dexamethasone.  His Eliquis was permanently discontinued. The patient mentions he sees cardiology for his history for atrial fibrillation in February.   The patient is also been following with palliative care due to constipation for which he uses MiraLAX, decreased appetite for which he takes Remeron. He also has a very dry mouth. He describes having what sounds like is EGD at [facility] for this and describes esophageal candidiasis, although I do not have records. He saw his dentist for dry mouth. When he saw palliative care at his last appointment, he received MMW which helped his symptoms significantly. He states this helped more than anything he has tried. He is wanting a refill today.   When the patient last saw Dr. [name], Dr. [name] recommended ordering a restaging scan to determine the best course of action regarding his renal cell carcinoma.  Given his limited metastatic disease, Dr. [name] recommended a period of observation until his other health issues have subsided.  The patient had a restaging scan and is here to review this today.  Since last being seen the patient denies any fever, chills, or night sweats.  He no longer is using assistive devices for ambulation.  Denies any abnormal bleeding or bruising.  Denies any chest pain, shortness of breath, cough, or hemoptysis.  Denies any nausea, vomiting, or diarrhea. As previously mentioned, he sometimes has constipation for which he takes miralax. His wife also bought prune juice. His  appetite is improving and he gained weight. He mentions drinking soda helps his taste.  Denies any jaundice or itching.       MEDICAL HISTORY: Past Medical History:  Diagnosis Date   Abdominal aortic aneurysm (AAA) without rupture 06/23/2018   Age-related osteoporosis without current pathological fracture  07/14/2021   BPH (benign prostatic hyperplasia) 01/19/2010   Cervical radiculopathy 06/16/2018   Chronic anticoagulation 08/22/2018   Chronic bilateral low back pain without sciatica 07/18/2019   Chronic sinusitis 05/18/2021   Diverticulosis    Erectile dysfunction due to arterial insufficiency 04/23/2015   Esophageal stricture    Essential hypertension 05/01/2018   Estimated Creatinine Clearance: 44.1 mL/min (by C-G formula based on SCr of 1.35 mg/dL).   GERD (gastroesophageal reflux disease) 11/10/2010   Hearing loss    Hiatal hernia    Hyperlipidemia with target LDL less than 70 11/28/2012   The 10-year ASCVD risk score Mikey Bussing DC Jr., et al., 2013) is: 30.7%   Values used to calculate the score:     Age: 84 years     Sex: Male     Is Non-Hispanic African American: No     Diabetic: No     Tobacco smoker: No     Systolic Blood Pressure: 706 mmHg     Is BP treated: No     HDL Cholesterol: 30.8 mg/dL     Total Cholesterol: 183 mg/dL   Hypoglycemia    Iron deficiency anemia due to dietary causes 12/07/2018   Lung nodule seen on imaging study 06/23/2018   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   Estimated Creatinine Clearance: 51.7 mL/min (by C-G formula based on SCr of 1.15 mg/dL).   Syncope    presented with atrial fib converted with Diltiazem   Systemic  inflammatory response syndrome 06/26/2018   Thrombocytopenia 01/19/2021    ALLERGIES:  is allergic to hydrocodone, monosodium glutamate, naproxen sodium, and other.  MEDICATIONS:  Current Outpatient Medications  Medication Sig Dispense Refill   acetaminophen (TYLENOL) 500 MG tablet Take 1,000 mg by mouth every 6 (six) hours as needed for mild pain.     Calcium Carbonate (CALCIUM 500 PO) Take 500 mg by mouth 2 (two) times daily.     denosumab (PROLIA) 60 MG/ML SOSY injection Inject 60 mg into the skin every 6 (six) months.     esomeprazole (NEXIUM) 40 MG capsule Take 1 capsule (40 mg total) by mouth daily before breakfast. 90 capsule 3   magic mouthwash (nystatin, diphenhydrAMINE, alum & mag hydroxide) suspension mixture Swish and swallow 5 mLs 3 (three) times daily as needed for mouth pain. 240 mL 1   mirtazapine (REMERON) 7.5 MG tablet Take 1 tablet (7.5 mg total) by mouth at bedtime. 90 tablet 3   Multiple Vitamins-Minerals (CENTRUM MULTI + OMEGA 3 PO) Take 1 tablet by mouth daily.      omega-3  acid ethyl esters (LOVAZA) 1 g capsule Take 2 capsules by mouth twice daily 360 capsule 1   rosuvastatin (CRESTOR) 10 MG tablet Take 1 tablet by mouth once daily 90 tablet 0   solifenacin (VESICARE) 10 MG tablet Take 1 tablet by mouth once daily (Patient taking differently: Take 10 mg by mouth at bedtime.) 90 tablet 0   VITAMIN D PO Take 1,000 Units by mouth daily.     No current facility-administered medications for this visit.    SURGICAL HISTORY:  Past Surgical History:  Procedure Laterality Date   CYSTOSCOPY WITH URETEROSCOPY AND STENT PLACEMENT Left 08/16/2018   Procedure: CYSTOSCOPY WITH LEFT RETROGRADE PYELOGRAM, BALLOON DILATION LEFT URETER, STENT PLACEMENT;  Surgeon: Lucas Mallow, MD;  Location: [facility];  Service: Urology;  Laterality: Left;   HERNIA REPAIR     INSERT / REPLACE / REMOVE PACEMAKER     LAPAROSCOPIC ASSISTED VENTRAL HERNIA REPAIR N/A 10/18/2018   Procedure:  LAPAROSCOPIC ASSISTED VENTRAL WALL HERNIA REPAIR WITH MESH;  Surgeon: Michael Boston, MD;  Location: [facility];  Service: General;  Laterality: N/A;   LAPAROSCOPIC NEPHRECTOMY, HAND ASSISTED Left 10/18/2018   Procedure: LEFT HAND ASSISTED LAPAROSCOPIC RADICAL NEPHRECTOMY WITH ADRENALECTOMY;  Surgeon: Lucas Mallow, MD;  Location: [facility];  Service: Urology;  Laterality: Left;   LOOP RECORDER INSERTION N/A 04/26/2018   Procedure: LOOP RECORDER INSERTION;  Surgeon: Thompson Grayer, MD;  Location: [place] [facility];  Service: Cardiovascular;  Laterality: N/A;   LOOP RECORDER REMOVAL N/A 07/18/2018   Procedure: LOOP RECORDER REMOVAL;  Surgeon: Thompson Grayer, MD;  Location: [place] [facility];  Service: Cardiovascular;  Laterality: N/A;   NASAL SEPTUM SURGERY     PACEMAKER IMPLANT N/A 07/18/2018   Procedure: PACEMAKER IMPLANT;  Surgeon: Thompson Grayer, MD;  Location: [place] [facility];  Service: Cardiovascular;  Laterality: N/A;   TONSILLECTOMY     TONSILLECTOMY     UMBILICAL HERNIA REPAIR  1993   WISDOM TOOTH EXTRACTION     about 82 years old    REVIEW OF SYSTEMS:   Review of Systems  Constitutional: Positive for improving appetite ane energy. Negative for chills, fever and unexpected weight change.  HENT: Negative for mouth sores, nosebleeds, sore throat and trouble swallowing.   Eyes: Negative for eye problems and icterus.  Respiratory: Negative for cough, hemoptysis, shortness of breath and wheezing.   Cardiovascular: Negative for chest pain and leg swelling.  Gastrointestinal: Positive for mild constipation. Negative for abdominal pain, diarrhea, nausea and vomiting.  Genitourinary: Negative for bladder incontinence, difficulty urinating, dysuria, frequency and hematuria.   Musculoskeletal: Negative for back pain, gait problem, neck pain and neck stiffness.  Skin: Negative for itching and rash.  Neurological: Positive for tremors. Negative for dizziness, extremity weakness, gait problem,  headaches, light-headedness and seizures.  Hematological: Negative for adenopathy. Does not bruise/bleed easily.  Psychiatric/Behavioral: Negative for confusion, depression and sleep disturbance. The patient is not nervous/anxious.     PHYSICAL EXAMINATION:  Blood pressure 120/65, pulse 70, temperature 97.6 F (36.4 C), resp. rate 18, weight 164 lb 3.2 oz (74.5 kg), SpO2 99 %.  ECOG PERFORMANCE STATUS: 1  Physical Exam  Constitutional: Oriented to person, place, and time and elderly appearing male and in no distress.  HENT:  Head: Normocephalic and atraumatic.  Mouth/Throat: Oropharynx is clear and moist. No oropharyngeal exudate.  Eyes: Conjunctivae are normal. Right eye exhibits no discharge. Left eye exhibits no discharge. No scleral icterus.  Neck: Normal range of  motion. Neck supple.  Cardiovascular: Normal rate, regular rhythm, normal heart sounds and intact distal pulses.   Pulmonary/Chest: Effort normal. Quiet breath sounds bilaterally. No respiratory distress. No wheezes. No rales.  Abdominal: Soft. Bowel sounds are normal. Exhibits no distension and no mass. There is no tenderness.  Musculoskeletal: Positive for kyphosis. Normal range of motion. Exhibits no edema.  Lymphadenopathy:    No cervical adenopathy.  Neurological: Positive for tremors. Alert and oriented to person, place, and time. Exhibits muscle wasting. Gait normal. Coordination normal.  Skin: Skin is warm and dry. No rash noted. Not diaphoretic. No erythema. No pallor.  Psychiatric: Mood, memory and judgment normal.  Vitals reviewed.  LABORATORY DATA: Lab Results  Component Value Date   WBC 5.5 03/30/2022   HGB 13.3 03/30/2022   HCT 39.1 03/30/2022   MCV 94.0 03/30/2022   PLT 106 (L) 03/30/2022      Chemistry      Component Value Date/Time   NA 134 (L) 03/30/2022 1417   NA 135 07/21/2020 1211   K 4.2 03/30/2022 1417   K 3.8 11/09/2013 0000   CL 104 03/30/2022 1417   CL 105 11/09/2013 0000   CO2  26 03/30/2022 1417   CO2 26 11/09/2013 0000   BUN 14 03/30/2022 1417   BUN 18 07/21/2020 1211   CREATININE 0.96 03/30/2022 1417   CREATININE 0.66 11/09/2013 0000      Component Value Date/Time   CALCIUM 9.6 03/30/2022 1417   CALCIUM 8.7 11/09/2013 0000   ALKPHOS 43 03/30/2022 1417   ALKPHOS 55 11/09/2013 0000   AST 19 03/30/2022 1417   ALT 18 03/30/2022 1417   BILITOT 0.4 03/30/2022 1417       RADIOGRAPHIC STUDIES:  CT Chest W Contrast  Result Date: 03/29/2022 CLINICAL DATA:  Left renal cell carcinoma, metastasis to the adrenal gland * Tracking Code: BO * EXAM: CT CHEST WITH CONTRAST CT ABDOMEN AND PELVIS WITH AND WITHOUT CONTRAST TECHNIQUE: Multidetector CT imaging of the chest was performed during intravenous contrast administration. Multidetector CT imaging of the abdomen and pelvis was performed following the standard protocol before and during bolus administration of intravenous contrast. RADIATION DOSE REDUCTION: This exam was performed according to the departmental dose-optimization program which includes automated exposure control, adjustment of the mA and/or kV according to patient size and/or use of iterative reconstruction technique. CONTRAST:  19mL OMNIPAQUE IOHEXOL 300 MG/ML  SOLN COMPARISON:  CT chest abdomen pelvis, 11/24/2021 FINDINGS: CT CHEST FINDINGS Cardiovascular: No significant vascular findings. Normal heart size. No pericardial effusion. Mediastinum/Nodes: No enlarged mediastinal, hilar, or axillary lymph nodes. Thyroid gland, trachea, and esophagus demonstrate no significant findings. Lungs/Pleura: Moderate centrilobular and paraseptal emphysema. Significantly improved dependent bibasilar heterogeneous airspace opacity and consolidation, with some persistent scarring, fibrosis, and/or subacute airspace disease, particularly in the left lung base (series 16, image 106). New nodule of the peripheral left upper lobe measuring 0.7 cm (series 16, image 48). Additional  unchanged small nodules, for example of the inferior right middle lobe measuring 0.8 cm (series 16, image 101). Multiple additional nodules seen on prior examinations are diminished in size or resolved, for example a nodule of the left lower lobe measuring 0.3 cm, previously 0.8 cm no pleural effusion or pneumothorax. Musculoskeletal: No chest wall abnormality. No acute osseous findings. CT ABDOMEN PELVIS FINDINGS Hepatobiliary: Unchanged, subtle 0.5 cm arterially hyperenhancing lesion of the peripheral right lobe of the liver, hepatic segment VII (series 3, image 23). No gallstones, gallbladder wall thickening, or biliary dilatation. Pancreas:  Status post distal pancreatectomy. Resolved fullness and fat stranding about the pancreatic head reported by prior examination (series 12, image 27). No pancreatic ductal dilatation or surrounding inflammatory changes. Spleen: Normal in size without significant abnormality. Adrenals/Urinary Tract: Unchanged postoperative appearance status post left nephrectomy and adrenalectomy, with an unchanged small soft tissue nodule adjacent to a resection margin measuring 1.2 x 0.8 cm (series 12, image 98). Small, benign right renal cortical cyst, for which no further follow-up or characterization is required. The right kidney and adrenal gland are otherwise normal, without renal calculi, solid lesion, or hydronephrosis. Bladder is unremarkable. Stomach/Bowel: Stomach is within normal limits. Appendix appears normal. No evidence of bowel wall thickening, distention, or inflammatory changes. Descending and sigmoid diverticulosis. Vascular/Lymphatic: Aortic atherosclerosis. Unchanged infrarenal abdominal aortic aneurysm measuring up to 3.1 x 2.8 cm (series 3, image 68). Varices about the left upper quadrant. No enlarged abdominal or pelvic lymph nodes. Reproductive: No mass or other abnormality. Other: Small, fat containing left inguinal hernia.  No ascites. Musculoskeletal: No acute  osseous findings. Unchanged high-grade wedge deformities of T12 and L1 (series 14, image 85). IMPRESSION: 1. Unchanged postoperative appearance status post left nephrectomy and adrenalectomy, with an unchanged small soft tissue nodule adjacent to a resection margin in the nephrectomy bed measuring 1.2 x 0.8 cm. 2. Status post distal pancreatectomy. Resolved fullness and fat stranding about the pancreatic head reported by prior examination. 3. New nodule of the peripheral left upper lobe measuring 0.7 cm, nonspecific although concerning for metastasis. No nodule previously noted in this vicinity. 4. Multiple nodules seen by prior examination are diminished in size or completely resolved. Occasional, unchanged additional small nodules. 5. Significantly improved dependent bibasilar heterogeneous airspace opacity and consolidation, with some persistent scarring, fibrosis, and/or subacute airspace disease, particularly in the left lung base. 6. Unchanged, subtle 0.5 cm arterially hyperenhancing lesion of the peripheral right lobe of the liver, hepatic segment VII. This is most likely a small flash filling hemangioma or focal nodular hyperplasia, however given history of renal cell carcinoma, continued attention on follow-up is warranted. 7. Unchanged infrarenal abdominal aortic aneurysm measuring up to 3.1 x 2.8 cm. Recommend follow-up ultrasound every 3 years if not otherwise imaged and clinically appropriate. This recommendation follows ACR consensus guidelines: White Paper of the ACR Incidental Findings Committee II on Vascular Findings. J Am Coll Radiol 2013; 10:789-794. 8. Emphysema. Aortic Atherosclerosis (ICD10-I70.0) and Emphysema (ICD10-J43.9). Electronically Signed   By: Delanna Ahmadi M.D.   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 Multidetector CT imaging of the abdomen and pelvis was performed following the standard protocol before and during bolus administration of intravenous contrast. RADIATION DOSE REDUCTION: This exam was performed according to the departmental dose-optimization program which includes automated exposure control, adjustment of the mA and/or kV according to patient size and/or use of iterative reconstruction technique. CONTRAST:  73mL OMNIPAQUE IOHEXOL 300 MG/ML  SOLN COMPARISON:  CT chest abdomen pelvis, 11/24/2021 FINDINGS: CT CHEST FINDINGS Cardiovascular: No significant vascular findings. Normal heart size. No pericardial effusion. Mediastinum/Nodes: No enlarged mediastinal, hilar, or axillary lymph nodes. Thyroid gland, trachea, and esophagus demonstrate no significant findings. Lungs/Pleura: Moderate centrilobular and paraseptal emphysema. Significantly improved dependent bibasilar heterogeneous airspace opacity  and consolidation, with some persistent scarring, fibrosis, and/or subacute airspace disease, particularly in the left lung base (series 16, image 106). New nodule of the peripheral left upper lobe measuring 0.7 cm (series 16, image 48). Additional unchanged small nodules, for example of the inferior right middle lobe measuring 0.8 cm (series 16, image 101). Multiple additional nodules seen on prior examinations are diminished in size or resolved, for example a nodule of the left lower lobe measuring 0.3 cm, previously 0.8 cm no pleural effusion or pneumothorax. Musculoskeletal: No chest wall abnormality. No acute osseous findings. CT ABDOMEN PELVIS FINDINGS Hepatobiliary: Unchanged, subtle 0.5 cm arterially hyperenhancing lesion of the peripheral right lobe of the liver, hepatic segment VII (series 3, image 23). No gallstones, gallbladder wall thickening, or biliary  dilatation. Pancreas: Status post distal pancreatectomy. Resolved fullness and fat stranding about the pancreatic head reported by prior examination (series 12, image 27). No pancreatic ductal dilatation or surrounding inflammatory changes. Spleen: Normal in size without significant abnormality. Adrenals/Urinary Tract: Unchanged postoperative appearance status post left nephrectomy and adrenalectomy, with an unchanged small soft tissue nodule adjacent to a resection margin measuring 1.2 x 0.8 cm (series 12, image 98). Small, benign right renal cortical cyst, for which no further follow-up or characterization is required. The right kidney and adrenal gland are otherwise normal, without renal calculi, solid lesion, or hydronephrosis. Bladder is unremarkable. Stomach/Bowel: Stomach is within normal limits. Appendix appears normal. No evidence of bowel wall thickening, distention, or inflammatory changes. Descending and sigmoid diverticulosis. Vascular/Lymphatic: Aortic atherosclerosis. Unchanged infrarenal abdominal aortic aneurysm measuring up to 3.1 x 2.8 cm (series 3, image 68). Varices about the left upper quadrant. No enlarged abdominal or pelvic lymph nodes. Reproductive: No mass or other abnormality. Other: Small, fat containing left inguinal hernia.  No ascites. Musculoskeletal: No acute osseous findings. Unchanged high-grade wedge deformities of T12 and L1 (series 14, image 85). IMPRESSION: 1. Unchanged postoperative appearance status post left nephrectomy and adrenalectomy, with an unchanged small soft tissue nodule adjacent to a resection margin in the nephrectomy bed measuring 1.2 x 0.8 cm. 2. Status post distal pancreatectomy. Resolved fullness and fat stranding about the pancreatic head reported by prior examination. 3. New nodule of the peripheral left upper lobe measuring 0.7 cm, nonspecific although concerning for metastasis. No nodule previously noted in this vicinity. 4. Multiple nodules seen by  prior examination are diminished in size or completely resolved. Occasional, unchanged additional small nodules. 5. Significantly improved dependent bibasilar heterogeneous airspace opacity and consolidation, with some persistent scarring, fibrosis, and/or subacute airspace disease, particularly in the left lung base. 6. Unchanged, subtle 0.5 cm arterially hyperenhancing lesion of the peripheral right lobe of the liver, hepatic segment VII. This is most likely a small flash filling hemangioma or focal nodular hyperplasia, however given history of renal cell carcinoma, continued attention on follow-up is warranted. 7. Unchanged infrarenal abdominal aortic aneurysm measuring up to 3.1 x 2.8 cm. Recommend follow-up ultrasound every 3 years if not otherwise imaged and clinically appropriate. This recommendation follows ACR consensus guidelines: White Paper of the ACR Incidental Findings Committee II on Vascular Findings. J Am Coll Radiol 2013; 10:789-794. 8. Emphysema. Aortic Atherosclerosis (ICD10-I70.0) and Emphysema (ICD10-J43.9). Electronically Signed   By: Delanna Ahmadi M.D.   On: 03/29/2022 13:56     ASSESSMENT/PLAN:  82 year old with:   1.  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   The patient initially presented with localized disease in 2020 and is status post  left radical nephrectomy by Dr. Gloriann Loan on 10/18/2018.  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  The patient then developed pulmonary involvement in 2023.  He then underwent 2 cycles of treatment with ipilimumab 1 mg/kg and nivolumab 3 mg/kg IV every 3 weeks. Last dose 10/19/21.  He had had 2 cycles of treatment but was on hold secondary to autoimmune complications predominantly with mucositis and pharyngitis.  In December 2023, the patient developed subdural hematoma secondary to ITP and blood thinner use.  The patient completed IVIG in the hospital and  dexamethasone.  Per Dr. Hazeline Junker last note on 02/17/2022, the patient has been off treatment due to several health issues Dr. [name] recommended restaging scans.  The patient recently had a restaging scan of the chest, abdomen, and pelvis.  The patient was seen with Dr. Julien Nordmann today.  Dr. Julien Nordmann personally independently reviewed the scan and discussed the results with the patient today.  The scan showed no evidence for disease progression except for a 7 mm nonspecific new pulmonary nodule which warrants close monitoring.  Dr. Julien Nordmann recommends monitoring for now while his other health issues subside.   Per Dr. [name], should he develop progressive disease, other options include axitinib or cabozantinib, but would not recommend staring this due to his recent hermorrhage and thrombocytopenia.   I will arrange for restaging CT scan of the CAP in 3 months. We will see him for a follow up visit a few days later to review the results.   For the dry mouth, I refilled his MMW. This has helped him a lot.    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  He presented with a platelet count of less than 5 and subdural hematoma.  He had a complete response to IVIG and dexamethasone.   Presumably to acute ITP with excellent response to IVIG and steroid therapy.  His platelet count is improving to 106k today. I discussed that should the patient develop new or worsening symptoms of bleeding or bruising, that we would arrange for repeat CBC. The patient was asking about eliquis today. Given his history, this was permanently discontinued per hospital discharge paperwork. Discussed this is a conversation to have with his cardiologist at his upcoming follow up.     The patient was advised to call immediately if he has any concerning symptoms in the interval. The patient voices understanding of current disease status and treatment options and is in agreement with the current care plan. All questions were  answered. The patient knows to call the clinic with any problems, questions or concerns. We can certainly see the patient much sooner if necessary    Orders Placed This Encounter  Procedures   CT Chest W Contrast    Standing Status:   Future    Standing Expiration Date:   03/30/2023    Order Specific Question:   If indicated for the ordered procedure, I authorize the administration of contrast media per Radiology protocol    Answer:   Yes    Order Specific Question:   Does the patient have a contrast media/X-ray dye allergy?    Answer:   No    Order Specific Question:   Preferred imaging location?    Answer:   [facility]   CT Abdomen Pelvis W Wo Contrast    This exam should ONLY be ordered for initial diagnosis or follow up of known pancreatic/liver/renal/bladder masses.    Standing Status:   Future  Standing Expiration Date:   03/31/2023    Order Specific Question:   If indicated for the ordered procedure, I authorize the administration of contrast media per Radiology protocol    Answer:   Yes    Order Specific Question:   Does the patient have a contrast media/X-ray dye allergy?    Answer:   No    Order Specific Question:   Preferred imaging location?    Answer:   [facility]    Order Specific Question:   Is Oral Contrast requested for this exam?    Answer:   Yes, Per Radiology protocol   CBC with Differential ([facility] Only)    Standing Status:   Future    Standing Expiration Date:   03/31/2023   CMP ([facility] only)    Standing Status:   Future    Standing Expiration Date:   03/31/2023     Tobe Sos Skylar Flynt, PA-C 03/30/22  ADDENDUM: Hematology/Oncology Attending: I had a face-to-face encounter with the patient today.  I reviewed his record, lab, scan and recommended his care plan.  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  The patient is status post left radical nephrectomy on October 18, 2018.  He was followed by observation until he has evidence for metastatic disease in August 2023 and he was treated with 2 cycles of immunotherapy with ipilimumab and nivolumab discontinued secondary to autoimmune complication with mucositis and pharyngitis.  He is currently on close monitoring and observation.  The patient also has a history of ITP.  He was treated in the past with IVIG as well as steroids. He had repeat CT scan of the chest, abdomen and pelvis performed recently.  I personally and independently reviewed the scan and discussed the result and showed the images to the patient and his wife today. His scan showed no concerning findings for disease progression except for new left lung 7 mm nodules that need close monitoring. I recommended for the patient to continue on observation with repeat CT scan of the chest, abdomen and pelvis in 3 months. He was advised to call immediately if he has any concerning symptoms in the interval. The total time spent in the appointment was 30 minutes. Disclaimer: This note was dictated with voice recognition software. Similar sounding words can inadvertently be transcribed and may be missed upon review. Marinda Tyer L Errick Salts, PA-C

## 2022-03-31 ENCOUNTER — Inpatient Hospital Stay: Payer: Medicare Other

## 2022-03-31 ENCOUNTER — Inpatient Hospital Stay: Payer: Medicare Other | Admitting: Internal Medicine

## 2022-04-01 ENCOUNTER — Other Ambulatory Visit: Payer: Self-pay | Admitting: Physician Assistant

## 2022-04-01 ENCOUNTER — Other Ambulatory Visit: Payer: Self-pay | Admitting: Nurse Practitioner

## 2022-04-01 DIAGNOSIS — C649 Malignant neoplasm of unspecified kidney, except renal pelvis: Secondary | ICD-10-CM

## 2022-04-01 DIAGNOSIS — B370 Candidal stomatitis: Secondary | ICD-10-CM

## 2022-04-01 MED ORDER — NYSTATIN 100000 UNIT/ML MT SUSP: 5.0000 mL | Freq: Three times a day (TID) | OROMUCOSAL | 1 refills | Status: DC | PRN

## 2022-04-06 ENCOUNTER — Encounter: Payer: Self-pay | Admitting: Nurse Practitioner

## 2022-04-06 ENCOUNTER — Telehealth: Payer: Self-pay | Admitting: Cardiovascular Disease

## 2022-04-06 ENCOUNTER — Inpatient Hospital Stay: Payer: Medicare Other | Attending: Oncology | Admitting: Nurse Practitioner

## 2022-04-06 DIAGNOSIS — R630 Anorexia: Secondary | ICD-10-CM

## 2022-04-06 DIAGNOSIS — R530 Neoplastic (malignant) related fatigue: Secondary | ICD-10-CM

## 2022-04-06 DIAGNOSIS — Z515 Encounter for palliative care: Secondary | ICD-10-CM

## 2022-04-06 NOTE — Telephone Encounter (Signed)
Pt c/o medication issue:  1. Name of Medication: apixaban (ELIQUIS) 5 MG TABS tablet   2. How are you currently taking this medication (dosage and times per day)? Not currently taking   3. Are you having a reaction (difficulty breathing--STAT)? No   4. What is your medication issue? Wants to know if he should start taking eliquis again. Was taken off of it in the hospital. Please advise.

## 2022-04-06 NOTE — Progress Notes (Signed)
[facility]  [phone]) [phone] [phone]) [phone]   Name: [name] Date: 04/06/2022 MRN: [id number]  DOB: 09/17/1940  Patient Care Team: [name], MD as PCP - General Nahser, Wonda Cheng, MD as PCP - Cardiology (Cardiology) Thompson Grayer, MD (Inactive) as PCP - Electrophysiology (Cardiology) Irene Shipper, MD as Consulting Physician (Gastroenterology) Michael Boston, MD as Consulting Physician (General Surgery) Thompson Grayer, MD (Inactive) as Consulting Physician (Cardiology) Lucas Mallow, MD as Consulting Physician (Urology) Wyatt Portela, MD as Consulting Physician (Oncology) Delice Bison Darnelle Maffucci, [facility] (Inactive) (Pharmacist)    I connected with [name] on 04/06/22 at 10:30 AM EST by phone and verified that I am speaking with the correct person using two identifiers.   I discussed the limitations, risks, security and privacy concerns of performing an evaluation and management service by telemedicine and the availability of in-person appointments. I also discussed with the patient that there may be a patient responsible charge related to this service. The patient expressed understanding and agreed to proceed.   Other persons participating in the visit and their role in the encounter: maygan, RN   Patient's location: home  Provider's location: [facility]   Chief Complaint: f/u of symptom management    INTERVAL HISTORY: Frank Aull. is a 82 y.o. 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  He was also noted to have pancreatic involvement. PMHx significant for HTN, HLD, PAF (on Eliquis), sick sinus syndrome (pacemaker in situ), AAA, CKD stage IIIb, BPH, GERD, diverticulosis. Palliative ask to see for symptom management and goals of care.   SOCIAL HISTORY:     reports that he quit smoking about 35 years ago. His  smoking use included cigarettes. He has never used smokeless tobacco. He reports that he does not drink alcohol and does not use drugs.  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   GERD (gastroesophageal reflux disease) 11/10/2010   Hearing loss    Hiatal hernia    Hyperlipidemia with target LDL less than 70 11/28/2012   The 10-year ASCVD risk score Mikey Bussing DC Jr., et al., 2013) is: 30.7%   Values used to calculate the score:     Age: 66 years     Sex: Male     Is Non-Hispanic African American: No     Diabetic: No     Tobacco smoker: No     Systolic Blood Pressure: 213 mmHg     Is BP treated: No     HDL Cholesterol: 30.8 mg/dL     Total Cholesterol: 183 mg/dL   Hypoglycemia    Iron deficiency anemia due to dietary causes 12/07/2018   Lung nodule seen on imaging study 06/23/2018   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   Estimated Creatinine Clearance: 51.7 mL/min (by C-G formula based on SCr of 1.15 mg/dL).   Syncope    presented with atrial fib converted with Diltiazem   Systemic inflammatory response syndrome 06/26/2018   Thrombocytopenia 01/19/2021    ALLERGIES:  is allergic to hydrocodone, monosodium glutamate, naproxen sodium, and other.  MEDICATIONS:  Current Outpatient Medications  Medication Sig Dispense Refill   acetaminophen (TYLENOL) 500 MG tablet Take 1,000 mg by mouth every 6 (six) hours as needed for mild pain.     Calcium Carbonate (CALCIUM 500 PO) Take 500 mg by mouth 2 (two) times daily.     denosumab (PROLIA) 60 MG/ML SOSY injection Inject 60 mg into the skin every 6 (six) months.     esomeprazole (NEXIUM) 40 MG capsule Take 1 capsule (40 mg total) by mouth daily before breakfast. 90 capsule 3   magic mouthwash (nystatin, diphenhydrAMINE, alum & mag hydroxide) suspension mixture Swish and swallow 5 mLs 3 (three) times daily as needed for mouth pain. 240 mL 1   mirtazapine (REMERON) 7.5 MG tablet Take 1 tablet (7.5 mg total) by mouth at bedtime. 90 tablet 3   Multiple Vitamins-Minerals (CENTRUM MULTI + OMEGA 3 PO) Take 1 tablet by mouth daily.      omega-3 acid ethyl esters (LOVAZA) 1 g capsule Take 2 capsules by mouth twice daily 360 capsule 1   rosuvastatin (CRESTOR) 10 MG tablet Take 1 tablet by mouth once daily 90 tablet 0   solifenacin (VESICARE) 10 MG tablet Take 1 tablet by mouth once daily (Patient taking differently: Take 10 mg by mouth at bedtime.) 90 tablet 0   VITAMIN D PO Take 1,000 Units by mouth daily.     No current facility-administered medications for this visit.    VITAL SIGNS: There were no vitals taken for this visit. There were no vitals filed for this visit.   Estimated body mass index is 27.32 kg/m as calculated from the following:   Height as of 02/18/22: 5\' 5"  (1.651 m).   Weight as of 03/30/22: 164 lb 3.2 oz (74.5 kg).   PERFORMANCE STATUS (ECOG) : 1 - Symptomatic but completely ambulatory   IMPRESSION: I connected by phone with Frank [name] and his wife. No acute distress noted. He expresses appreciation of how well he is feeling. He has been able to be more active around his home. Shares he and wife are doing some much needed house repairs and cleaning. He has been outside doing some building.   [name] shares his oral and throat discomfort has greatly improved and almost resolved since he began use of magic mouthwash.  Reports his hoarseness has been resolved, appetite is restored, and any pain discomfort is much better.  Frank [name] reports his appetite has improved to the point where he has gained 8 pounds which he and his wife are much appreciative of.  He is emotional sharing that at one point he  was not certain he would be able to continue on and now he feels like "he has a second chance to thrive".  He continues to do as well as expected.  And is hopeful for continued improvement in his quality of life.  All questions answered and support provided.  I discussed the importance of continued conversation with family and their medical providers regarding overall plan of care and treatment options, ensuring decisions are within the context of the patients values and GOCs.  PLAN:  Continue mirtazapine 7.5 mg at bedtime Education provided on home options to assist in discomfort from xerostomia. Magic mouthwash swish and swallow as needed.  Provided much improvement. Ongoing symptom management support. Education provided on nutrition.  Patient would benefit from dietitian consult for additional support.  Patient's appetite has increased.  Reports per his home scales he has gained 8 pounds. I will plan to see patient back in 3-4 weeks in collaboration  to other oncology appointments.    Patient expressed understanding and was in agreement with this plan. He also understands that He can call the clinic at any time with any questions, concerns, or complaints.   Time Total: 40 min   Visit consisted of counseling and education dealing with the complex and emotionally intense issues of symptom management and palliative care in the setting of serious and potentially life-threatening illness.Greater than 50%  of this time was spent counseling and coordinating care related to the above assessment and plan.  Alda Lea, AGPCNP-BC  Palliative Medicine Team/[place]

## 2022-04-07 NOTE — Telephone Encounter (Signed)
Per ED note on 02/06/22: Bilateral subdural hematoma in setting of severe thrombocytopenia and recent Eliquis use Previously on Eliquis, took naproxen for headache. Received Andexxa and Plt transfusion on admission. CT Head 12/10 with stable SDH, worsening mental status/ agitated delirium overnight 12/12 on precedex, off by 12/13 - NSGY following, appreciate recs, discussing if candidate for MMA embolization - hold anticoagulation and antiplatelets -follow-up with PCP for further discussion on anticoagulation as we discussed the risk at this time outweighs the benefit of anticoagulation  Returned call to patient who states he has followed up with his cancer doctor and his labs looked good. Advised him to hold his Eliquis until he sees Dr Acie Fredrickson who will give final say on when to restart Eliquis. Pt scheduled for 04/09/22 @ 11:40.

## 2022-04-08 ENCOUNTER — Encounter: Payer: Self-pay | Admitting: Cardiovascular Disease
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
## 2022-04-09 ENCOUNTER — Ambulatory Visit: Payer: Medicare Other | Attending: Cardiovascular Disease | Admitting: Cardiovascular Disease

## 2022-04-09 ENCOUNTER — Encounter: Payer: Self-pay | Admitting: Cardiovascular Disease

## 2022-04-09 VITALS — BP 126/60 | HR 60 | Ht 65.0 in | Wt 165.4 lb

## 2022-04-09 DIAGNOSIS — S065XAA Traumatic subdural hemorrhage with loss of consciousness status unknown, initial encounter: Secondary | ICD-10-CM

## 2022-04-09 DIAGNOSIS — I480 Paroxysmal atrial fibrillation: Secondary | ICD-10-CM

## 2022-04-09 NOTE — Patient Instructions (Addendum)
Medication Instructions:  STOP Eliquis *If you need a refill on your cardiac medications before your next appointment, please call your pharmacy*   Lab Work: NONE If you have labs (blood work) drawn today and your tests are completely normal, you will receive your results only by: [place] (if you have MyChart) OR A paper copy in the mail If you have any lab test that is abnormal or we need to change your treatment, we will call you to review the results.   Testing/Procedures: Ambulatory Referral to Structural [facility] consult)   Follow-Up: At [facility], you and your health needs are our priority.  As part of our continuing mission to provide you with exceptional heart care, we have created designated Provider Care Teams.  These Care Teams include your primary Cardiologist (physician) and Advanced Practice Providers (APPs -  Physician Assistants and Nurse Practitioners) who all work together to provide you with the care you need, when you need it.  We recommend signing up for the patient portal called "MyChart".  Sign up information is provided on this After Visit Summary.  MyChart is used to connect with patients for Virtual Visits (Telemedicine).  Patients are able to view lab/test results, encounter notes, upcoming appointments, etc.  Non-urgent messages can be sent to your provider as well.   To learn more about what you can do with MyChart, go to [email or web address].    Your next appointment:   6 month(s)  Provider:   Mertie Moores, MD

## 2022-04-14 ENCOUNTER — Ambulatory Visit: Payer: Medicare Other

## 2022-04-14 DIAGNOSIS — I495 Sick sinus syndrome: Secondary | ICD-10-CM | POA: Diagnosis not present

## 2022-04-14 LAB — CUP PACEART REMOTE DEVICE CHECK
Battery Remaining Longevity: 128 mo
Battery Voltage: 3.01 V
Brady Statistic AP VP Percent: 0.03 %
Brady Statistic AP VS Percent: 48.9 %
Brady Statistic AS VP Percent: 0.01 %
Brady Statistic AS VS Percent: 51.06 %
Brady Statistic RA Percent Paced: 49.07 %
Brady Statistic RV Percent Paced: 0.04 %
Date Time Interrogation Session: 20240213212247
Implantable Lead Connection Status: 753985
Implantable Lead Connection Status: 753985
Implantable Lead Implant Date: 20200519
Implantable Lead Implant Date: 20200519
Implantable Lead Location: 753859
Implantable Lead Location: 753860
Implantable Lead Model: 5076
Implantable Lead Model: 5076
Implantable Pulse Generator Implant Date: 20200519
Lead Channel Impedance Value: 304 Ohm
Lead Channel Impedance Value: 361 Ohm
Lead Channel Impedance Value: 494 Ohm
Lead Channel Impedance Value: 532 Ohm
Lead Channel Pacing Threshold Amplitude: 0.5 V
Lead Channel Pacing Threshold Amplitude: 0.5 V
Lead Channel Pacing Threshold Pulse Width: 0.4 ms
Lead Channel Pacing Threshold Pulse Width: 0.4 ms
Lead Channel Sensing Intrinsic Amplitude: 4.875 mV
Lead Channel Sensing Intrinsic Amplitude: 4.875 mV
Lead Channel Sensing Intrinsic Amplitude: 6.375 mV
Lead Channel Sensing Intrinsic Amplitude: 6.375 mV
Lead Channel Setting Pacing Amplitude: 1.5 V
Lead Channel Setting Pacing Amplitude: 2.5 V
Lead Channel Setting Pacing Pulse Width: 0.4 ms
Lead Channel Setting Sensing Sensitivity: 2 mV
Zone Setting Status: 755011
Zone Setting Status: 755011

## 2022-04-15 ENCOUNTER — Telehealth: Payer: Self-pay

## 2022-04-15 ENCOUNTER — Encounter: Payer: Self-pay | Admitting: Cardiovascular Disease

## 2022-04-15 NOTE — Telephone Encounter (Signed)
Last transmission received was on 04/13/22.  Still do not have transmission for 04/14/22.  Waiting for updated remote manual send.

## 2022-04-15 NOTE — Telephone Encounter (Signed)
Pt also wants to know how many episodes of A-fib he has had since December.

## 2022-04-15 NOTE — Telephone Encounter (Signed)
The patient wants to know if his transmission shows any A-fib on yesterday. I told him we did not receive a transmission yesterday. I asked him to send a transmission and the nurse will review it and give him a call back.

## 2022-04-16 NOTE — Telephone Encounter (Signed)
Informed patient that he had only had one episode on AF on 02/22/22, patient stated he was in hospital at that time

## 2022-04-16 NOTE — Telephone Encounter (Signed)
I called the patient and he wanted to know how many episodes of A-fib he had. I let him speak with [name].

## 2022-04-19 ENCOUNTER — Ambulatory Visit: Payer: Medicare Other | Admitting: Cardiovascular Disease

## 2022-04-22 ENCOUNTER — Telehealth: Payer: Self-pay | Admitting: Cardiovascular Disease

## 2022-04-22 NOTE — Telephone Encounter (Signed)
Frank [name], Frank Cheng, MD  Theodoro Parma, RN; Sherren Mocha, MD; Donnalee Curry Burnard Bunting, Frank Cheng, MD I talked with Dr. Zada Finders today His head bleed was in the setting of a platelet count of < 5K. Dr. Zada Finders recommended getting a noncontrast head CT scan to evaluate his previous intra cranial hemorrhage. Then have him see Dr. Zada Finders for formal recommendations Thanks  Sherren Mocha, MD  Frank [name], Frank Cheng, MD; Theodoro Parma, RN; Donnalee Curry Ronie Spies and Clearnce Hasten and I reviewed his chart and he has metastatic renal cell CA, probably best to hold off on Watchman eval. Phil will touch base with him.  Thx - Coop  Spoke with Dr Acie Fredrickson to confirm, and he does wish to cancel the above request for CT. Encounter closed at this time and order not placed.

## 2022-04-22 NOTE — Telephone Encounter (Signed)
-----   Message from Thayer Headings, MD sent at 04/20/2022  4:42 PM EST ----- Regarding: watchman work up I talked with Dr. Zada Finders today  His head bleed was in the setting of a platelet count of < 5K.  Dr. Zada Finders recommended getting a noncontrast head CT scan to evaluate his previous intra cranial hemorrhage. Then have him see Dr. Zada Finders for formal recommendations  Thanks   ----- Message ----- From: Theodoro Parma, RN Sent: 04/20/2022  11:49 AM EST To: Thayer Headings, MD  Hi! Have you heard back from Dr. Zada Finders about this fella's Eliquis per chance? He asked about it when I spoke with him the other day and I told him I'd ask you.  Thank you!  KK

## 2022-04-25 ENCOUNTER — Other Ambulatory Visit: Payer: Self-pay | Admitting: Internal Medicine

## 2022-04-26 ENCOUNTER — Other Ambulatory Visit: Payer: Self-pay | Admitting: Internal Medicine

## 2022-04-26 ENCOUNTER — Other Ambulatory Visit: Payer: Self-pay | Admitting: Nurse Practitioner

## 2022-04-26 DIAGNOSIS — C649 Malignant neoplasm of unspecified kidney, except renal pelvis: Secondary | ICD-10-CM

## 2022-04-26 DIAGNOSIS — B370 Candidal stomatitis: Secondary | ICD-10-CM

## 2022-04-26 DIAGNOSIS — E785 Hyperlipidemia, unspecified: Secondary | ICD-10-CM

## 2022-04-26 MED ORDER — NYSTATIN 100000 UNIT/ML MT SUSP: 5.0000 mL | Freq: Three times a day (TID) | OROMUCOSAL | 2 refills | Status: DC | PRN

## 2022-04-27 MED ORDER — NYSTATIN 100000 UNIT/ML MT SUSP: 5.0000 mL | Freq: Three times a day (TID) | OROMUCOSAL | 2 refills | Status: DC | PRN

## 2022-04-30 ENCOUNTER — Institutional Professional Consult (permissible substitution): Payer: Medicare Other | Admitting: Cardiovascular Disease

## 2022-05-14 NOTE — Progress Notes (Signed)
Palliative Medicine [facility]  [phone]) [phone] [phone]) [phone]   Name: [name] Date: 05/14/2022 MRN: [id number]  DOB: [birth year]  Patient Care Team: [name], MD as PCP - General Nahser, Deloris Ping, MD as PCP - Cardiology (Cardiology) Hillis Range, MD (Inactive) as PCP - Electrophysiology (Cardiology) Hilarie Fredrickson, MD as Consulting Physician (Gastroenterology) Karie Soda, MD as Consulting Physician (General Surgery) Hillis Range, MD (Inactive) as Consulting Physician (Cardiology) Crista Elliot, MD as Consulting Physician (Urology) Benjiman Core, MD as Consulting Physician (Oncology) Erlene Quan Vinnie Level, [facility] (Inactive) (Pharmacist)    I connected with [name] on 05/14/22 at 10:00 AM EDT by video and verified that I am speaking with the correct person using two identifiers.   I discussed the limitations, risks, security and privacy concerns of performing an evaluation and management service by telemedicine and the availability of in-person appointments. I also discussed with the patient that there may be a patient responsible charge related to this service. The patient expressed understanding and agreed to proceed.   Other persons participating in the visit and their role in the encounter: maygan, RN, wife   Patient's location: home  Provider's location: [facility]   Chief Complaint: f/u of symptom management    INTERVAL HISTORY: [name] is a 82 y.o. 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  He was also noted to have pancreatic involvement. PMHx significant for HTN, HLD, PAF (on Eliquis), sick sinus syndrome (pacemaker in situ), AAA, CKD stage IIIb, BPH, GERD, diverticulosis. Palliative ask to see for symptom management and goals of care.   SOCIAL HISTORY:     reports that he quit smoking about 35 years ago. His  smoking use included cigarettes. He has never used smokeless tobacco. He reports that he does not drink alcohol and does not use drugs.  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   GERD (gastroesophageal reflux disease) 11/10/2010   Hearing loss    Hiatal hernia    Hyperlipidemia with target LDL less than 70 11/28/2012   The 10-year ASCVD risk score Denman George DC Jr., et al., 2013) is: 30.7%   Values used to calculate the score:     Age: 43 years     Sex: Male     Is Non-Hispanic African American: No     Diabetic: No     Tobacco smoker: No     Systolic Blood Pressure: 132 mmHg     Is BP treated: No     HDL Cholesterol: 30.8 mg/dL     Total Cholesterol: 183 mg/dL   Hypoglycemia    Iron deficiency anemia due to dietary causes 12/07/2018   Lung nodule seen on imaging study 06/23/2018   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 08/22/2018    a Medtronic  Azure XT MRI conditional dual-chamber pacemaker for symptomatic sinus pauses, sick sinus syndrome, and syncope   Migraine headache    OAB (overactive bladder) 01/06/2016   PAF (paroxysmal atrial fibrillation)    Echo with normal LV function and mild LVH   Prediabetes 12/06/2018   Pure hyperglyceridemia 11/22/2007   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   Estimated Creatinine Clearance: 51.7 mL/min (by C-G formula based on SCr of 1.15 mg/dL).   Syncope    presented with atrial fib converted with Diltiazem   Systemic inflammatory response syndrome 06/26/2018   Thrombocytopenia 01/19/2021    ALLERGIES:  is allergic to hydrocodone, monosodium glutamate, naproxen sodium, and other.  MEDICATIONS:  Current Outpatient Medications  Medication Sig Dispense Refill   acetaminophen (TYLENOL) 500 MG tablet Take 1,000 mg by mouth every 6 (six) hours as needed for mild pain.     Calcium Carbonate (CALCIUM 500 PO) Take 500 mg by mouth 2 (two) times daily.     denosumab (PROLIA) 60 MG/ML SOSY injection Inject 60 mg into the skin every 6 (six) months.     esomeprazole (NEXIUM) 40 MG capsule Take 1 capsule (40 mg total) by mouth daily before breakfast. 90 capsule 3   magic mouthwash (nystatin, diphenhydrAMINE, alum & mag hydroxide) suspension mixture Swish and swallow 5 mLs 3 (three) times daily as needed for mouth pain. 240 mL 2   mirtazapine (REMERON) 7.5 MG tablet Take 1 tablet (7.5 mg total) by mouth at bedtime. 90 tablet 3   Multiple Vitamins-Minerals (CENTRUM MULTI + OMEGA 3 PO) Take 1 tablet by mouth daily.      omega-3 acid ethyl esters (LOVAZA) 1 g capsule Take 2 capsules by mouth twice daily 360 capsule 1   rosuvastatin (CRESTOR) 10 MG tablet Take 1 tablet by mouth once daily 90 tablet 0   solifenacin (VESICARE) 10 MG tablet Take 1 tablet (10 mg total) by mouth at bedtime. 90 tablet 1   VITAMIN D PO Take 1,000 Units by mouth daily.     No current facility-administered medications for this visit.    VITAL SIGNS: There were no vitals taken for this visit. There were no vitals filed for this visit.  Estimated body mass index is 27.52 kg/m as calculated  from the following:   Height as of 04/09/22: 5\' 5"  (1.651 m).   Weight as of 04/09/22: 165 lb 6.4 oz (75 kg).   PERFORMANCE STATUS (ECOG) : 1 - Symptomatic but completely ambulatory   IMPRESSION:  I connected with Mr. Sobieck and his wife by video/phone. No acute distress identified. Patient states he has been doing well. Trying to remain as active as possible. Denies nausea, vomiting, constipation, or diarrhea. Denies pain.   Report his oral/throat discomfort resolved with use of magic mouthwash. Speech is much clearer with minimal to no hoarseness. Appetite continues to improve which he is appreciative of. Reports he has gained another 10lbs at least.   He is emotional sharing that at one point he was not certain he would be able to continue on and now he feels like "he has a second chance to thrive".  He continues to do as well as expected.  And is hopeful for continued improvement in his quality of life.  All questions answered and  support provided.  I discussed the importance of continued conversation with family and their medical providers regarding overall plan of care and treatment options, ensuring decisions are within the context of the patients values and GOCs.  PLAN:  Continue mirtazapine 7.5 mg at bedtime Magic mouthwash swish and swallow as needed.  Provided much improvement. Ongoing symptom management support. Education provided on nutrition.  Patient would benefit from dietitian consult for additional support.  Appetite continues to improve. Reports gaining additional 10lbs at minimum.  I will plan to see patient back in 4-6 weeks in collaboration to other oncology appointments.    Patient expressed understanding and was in agreement with this plan. He also understands that He can call the clinic at any time with any questions, concerns, or complaints.    Time Total: 20 min   Visit consisted of counseling and education dealing with the complex and emotionally intense  issues of symptom management and palliative care in the setting of serious and potentially life-threatening illness.Greater than 50%  of this time was spent counseling and coordinating care related to the above assessment and plan.  Willette Alma, AGPCNP-BC  Palliative Medicine Team/[facility]

## 2022-05-14 NOTE — Progress Notes (Signed)
Remote pacemaker transmission.   

## 2022-05-18 ENCOUNTER — Inpatient Hospital Stay: Payer: Medicare Other | Attending: Oncology | Admitting: Nurse Practitioner

## 2022-05-18 DIAGNOSIS — R630 Anorexia: Secondary | ICD-10-CM

## 2022-05-18 DIAGNOSIS — Z515 Encounter for palliative care: Secondary | ICD-10-CM

## 2022-05-18 DIAGNOSIS — R530 Neoplastic (malignant) related fatigue: Secondary | ICD-10-CM | POA: Diagnosis not present

## 2022-05-23 ENCOUNTER — Other Ambulatory Visit: Payer: Self-pay | Admitting: Internal Medicine

## 2022-05-23 DIAGNOSIS — E781 Pure hyperglyceridemia: Secondary | ICD-10-CM

## 2022-06-14 ENCOUNTER — Encounter: Payer: Self-pay | Admitting: Student

## 2022-06-14 ENCOUNTER — Ambulatory Visit: Payer: Medicare Other | Attending: Student | Admitting: Student

## 2022-06-14 VITALS — BP 118/76 | HR 68 | Ht 65.0 in | Wt 170.8 lb

## 2022-06-14 DIAGNOSIS — I480 Paroxysmal atrial fibrillation: Secondary | ICD-10-CM | POA: Diagnosis not present

## 2022-06-14 DIAGNOSIS — I495 Sick sinus syndrome: Secondary | ICD-10-CM

## 2022-06-14 LAB — CUP PACEART INCLINIC DEVICE CHECK
Battery Remaining Longevity: 126 mo
Battery Voltage: 3.01 V
Brady Statistic AP VP Percent: 0.1 %
Brady Statistic AP VS Percent: 45.11 %
Brady Statistic AS VP Percent: 0.03 %
Brady Statistic AS VS Percent: 54.76 %
Brady Statistic RA Percent Paced: 45.35 %
Brady Statistic RV Percent Paced: 0.14 %
Date Time Interrogation Session: 20240415110637
Implantable Lead Connection Status: 753985
Implantable Lead Connection Status: 753985
Implantable Lead Implant Date: 20200519
Implantable Lead Implant Date: 20200519
Implantable Lead Location: 753859
Implantable Lead Location: 753860
Implantable Lead Model: 5076
Implantable Lead Model: 5076
Implantable Pulse Generator Implant Date: 20200519
Lead Channel Impedance Value: 304 Ohm
Lead Channel Impedance Value: 380 Ohm
Lead Channel Impedance Value: 475 Ohm
Lead Channel Impedance Value: 513 Ohm
Lead Channel Pacing Threshold Amplitude: 0.5 V
Lead Channel Pacing Threshold Amplitude: 0.5 V
Lead Channel Pacing Threshold Pulse Width: 0.4 ms
Lead Channel Pacing Threshold Pulse Width: 0.4 ms
Lead Channel Sensing Intrinsic Amplitude: 3.875 mV
Lead Channel Sensing Intrinsic Amplitude: 5.125 mV
Lead Channel Sensing Intrinsic Amplitude: 5.625 mV
Lead Channel Sensing Intrinsic Amplitude: 5.75 mV
Lead Channel Setting Pacing Amplitude: 1.5 V
Lead Channel Setting Pacing Amplitude: 2.5 V
Lead Channel Setting Pacing Pulse Width: 0.4 ms
Lead Channel Setting Sensing Sensitivity: 2 mV
Zone Setting Status: 755011
Zone Setting Status: 755011

## 2022-06-14 NOTE — Progress Notes (Signed)
  Electrophysiology Office Note:   Date:  06/14/2022  ID:  Frank Mcmurray., DOB [birth year], MRN [id number]  Primary Cardiologist: Kristeen Miss, MD Electrophysiologist: Dr. Johney Frame ->  Lanier Prude, MD   History of Present Illness:   [name] is a 82 y.o. male with h/o SSS s/p Medtronic PPM, PAF, chronic anticoagulation on Eliquis, and HLD seen today for routine electrophysiology followup. Since last being seen in our clinic the patient reports doing well overall.  he denies chest pain, palpitations, dyspnea, PND, orthopnea, nausea, vomiting, dizziness, syncope, edema, weight gain, or early satiety.   He has had some concern about being off of blood thinner given his history of PAF. He has been told by neurology he should NOT take ANY blood thinner. He has specifically been told (per pt) not to pursue Watchman as they would not want him on [facility] or anti-platelet for event that period of time given spontaneous hematoma.   Review of systems complete and found to be negative unless listed in HPI.   Device History: Device information 07/18/2018 MDT dual chamber PPM  (loop removed)  Studies Reviewed:    PPM Interrogation-  reviewed in detail today,  See PACEART report.  EKG is ordered today. Personal review shows NSR at 68 bpm    Risk Assessment/Calculations:             Physical Exam:   VS:  BP 118/76   Pulse 68   Ht 5\' 5"  (1.651 m)   Wt 170 lb 12.8 oz (77.5 kg)   SpO2 99%   BMI 28.42 kg/m    Wt Readings from Last 3 Encounters:  06/14/22 170 lb 12.8 oz (77.5 kg)  04/09/22 165 lb 6.4 oz (75 kg)  03/30/22 164 lb 3.2 oz (74.5 kg)     GEN: Well nourished, well developed in no acute distress NECK: No JVD; No carotid bruits CARDIAC: Regular rate and rhythm, no murmurs, rubs, gallops RESPIRATORY:  Clear to auscultation without rales, wheezing or rhonchi  ABDOMEN: Soft, non-tender, non-distended EXTREMITIES:  No edema; No deformity   ASSESSMENT AND PLAN:    Sick  sinus syndrome s/p Medtronic PPM  Normal PPM function See Pace Art report No changes today  PAF Burden chronically low.  Labs 03/30/22 stable.  CHA2DS2VASc  is at least 3. Eliquis previously "permanently" stopped in the setting of subdural hematoma  H/o subdural hematoma Pt states was spontaneous after COVID shot as well as a (chronic) dose of prolia.  He states he has been told by neurology he cannot every take any blood thinners, and also states he has been told he would not be a Watchman candidate, as they would not even want him on them for a short time.   Disposition:   Follow up with Dr. Lalla Brothers in 6 months  Signed, Graciella Freer, PA-C

## 2022-06-14 NOTE — Patient Instructions (Signed)
Medication Instructions:  Your physician recommends that you continue on your current medications as directed. Please refer to the Current Medication list given to you today.  *If you need a refill on your cardiac medications before your next appointment, please call your pharmacy*  Lab Work: None ordered If you have labs (blood work) drawn today and your tests are completely normal, you will receive your results only by: MyChart Message (if you have MyChart) OR A paper copy in the mail If you have any lab test that is abnormal or we need to change your treatment, we will call you to review the results.  Follow-Up: At [facility], you and your health needs are our priority.  As part of our continuing mission to provide you with exceptional heart care, we have created designated Provider Care Teams.  These Care Teams include your primary Cardiologist (physician) and Advanced Practice Providers (APPs -  Physician Assistants and Nurse Practitioners) who all work together to provide you with the care you need, when you need it.  Your next appointment:   6 month(s)  Provider:   Steffanie Dunn, MD

## 2022-06-29 ENCOUNTER — Ambulatory Visit (HOSPITAL_COMMUNITY)
Admission: RE | Admit: 2022-06-29 | Discharge: 2022-06-29 | Disposition: A | Discharge status: Home / Self Care | Payer: Medicare Other | Source: Ambulatory Visit | Attending: Physician Assistant | Admitting: Physician Assistant

## 2022-06-29 ENCOUNTER — Inpatient Hospital Stay: Payer: Medicare Other | Attending: Internal Medicine

## 2022-06-29 ENCOUNTER — Other Ambulatory Visit: Payer: Self-pay

## 2022-06-29 ENCOUNTER — Encounter (HOSPITAL_COMMUNITY): Payer: Self-pay

## 2022-06-29 DIAGNOSIS — C7800 Secondary malignant neoplasm of unspecified lung: Secondary | ICD-10-CM | POA: Insufficient documentation

## 2022-06-29 DIAGNOSIS — J439 Emphysema, unspecified: Secondary | ICD-10-CM | POA: Diagnosis not present

## 2022-06-29 DIAGNOSIS — C649 Malignant neoplasm of unspecified kidney, except renal pelvis: Secondary | ICD-10-CM | POA: Insufficient documentation

## 2022-06-29 DIAGNOSIS — C642 Malignant neoplasm of left kidney, except renal pelvis: Secondary | ICD-10-CM | POA: Diagnosis not present

## 2022-06-29 DIAGNOSIS — C7970 Secondary malignant neoplasm of unspecified adrenal gland: Secondary | ICD-10-CM | POA: Insufficient documentation

## 2022-06-29 DIAGNOSIS — Z87891 Personal history of nicotine dependence: Secondary | ICD-10-CM | POA: Insufficient documentation

## 2022-06-29 DIAGNOSIS — C7900 Secondary malignant neoplasm of unspecified kidney and renal pelvis: Secondary | ICD-10-CM | POA: Diagnosis not present

## 2022-06-29 DIAGNOSIS — K5730 Diverticulosis of large intestine without perforation or abscess without bleeding: Secondary | ICD-10-CM | POA: Diagnosis not present

## 2022-06-29 DIAGNOSIS — C801 Malignant (primary) neoplasm, unspecified: Secondary | ICD-10-CM | POA: Diagnosis not present

## 2022-06-29 DIAGNOSIS — J479 Bronchiectasis, uncomplicated: Secondary | ICD-10-CM | POA: Diagnosis not present

## 2022-06-29 LAB — CBC WITH DIFFERENTIAL (CANCER CENTER ONLY)
Abs Immature Granulocytes: 0.03 10*3/uL (ref 0.00–0.07)
Basophils Absolute: 0 10*3/uL (ref 0.0–0.1)
Basophils Relative: 1 %
Eosinophils Absolute: 0.1 10*3/uL (ref 0.0–0.5)
Eosinophils Relative: 1 %
HCT: 38.3 % — ABNORMAL LOW (ref 39.0–52.0)
Hemoglobin: 13.3 g/dL (ref 13.0–17.0)
Immature Granulocytes: 1 %
Lymphocytes Relative: 19 %
Lymphs Abs: 0.8 10*3/uL (ref 0.7–4.0)
MCH: 32.8 pg (ref 26.0–34.0)
MCHC: 34.7 g/dL (ref 30.0–36.0)
MCV: 94.3 fL (ref 80.0–100.0)
Monocytes Absolute: 0.4 10*3/uL (ref 0.1–1.0)
Monocytes Relative: 9 %
Neutro Abs: 3.1 10*3/uL (ref 1.7–7.7)
Neutrophils Relative %: 69 %
Platelet Count: 114 10*3/uL — ABNORMAL LOW (ref 150–400)
RBC: 4.06 MIL/uL — ABNORMAL LOW (ref 4.22–5.81)
RDW: 14.9 % (ref 11.5–15.5)
WBC Count: 4.4 10*3/uL (ref 4.0–10.5)
nRBC: 0 % (ref 0.0–0.2)

## 2022-06-29 LAB — CMP (CANCER CENTER ONLY)
ALT: 12 U/L (ref 0–44)
AST: 16 U/L (ref 15–41)
Albumin: 4.1 g/dL (ref 3.5–5.0)
Alkaline Phosphatase: 37 U/L — ABNORMAL LOW (ref 38–126)
Anion gap: 4 — ABNORMAL LOW (ref 5–15)
BUN: 15 mg/dL (ref 8–23)
CO2: 28 mmol/L (ref 22–32)
Calcium: 9.7 mg/dL (ref 8.9–10.3)
Chloride: 101 mmol/L (ref 98–111)
Creatinine: 0.99 mg/dL (ref 0.61–1.24)
GFR, Estimated: >60 mL/min (ref >60)
Glucose, Bld: 108 mg/dL — ABNORMAL HIGH (ref 70–99)
Potassium: 4.2 mmol/L (ref 3.5–5.1)
Sodium: 133 mmol/L — ABNORMAL LOW (ref 135–145)
Total Bilirubin: 0.7 mg/dL (ref 0.3–1.2)
Total Protein: 7 g/dL (ref 6.5–8.1)

## 2022-06-29 MED ORDER — SODIUM CHLORIDE (PF) 0.9 % IJ SOLN
INTRAMUSCULAR | Status: AC
  Filled 2022-06-29: qty 50

## 2022-06-29 MED ORDER — IOHEXOL 300 MG/ML  SOLN
100.0000 mL | Freq: Once | INTRAMUSCULAR | Status: AC | PRN
  Administered 2022-06-29: 100 mL via INTRAVENOUS

## 2022-06-30 NOTE — Progress Notes (Unsigned)
Palliative Medicine [facility]  [phone] [phone]) [phone]   Name: [name] Date: 06/30/2022 MRN: [id number]  DOB: 1940/05/02  Patient Care Team: [name], MD as PCP - General Nahser, Deloris Ping, MD as PCP - Cardiology (Cardiology) Lanier Prude, MD as PCP - Electrophysiology (Cardiology) Hilarie Fredrickson, MD as Consulting Physician (Gastroenterology) Karie Soda, MD as Consulting Physician (General Surgery) Hillis Range, MD (Inactive) as Consulting Physician (Cardiology) Crista Elliot, MD as Consulting Physician (Urology) Benjiman Core, MD as Consulting Physician (Oncology) Ellin Saba, [facility] (Inactive) (Pharmacist)   INTERVAL HISTORY: [name] is a 82 y.o. 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  He was also noted to have pancreatic involvement. PMHx significant for HTN, HLD, PAF (on Eliquis), sick sinus syndrome (pacemaker in situ), AAA, CKD stage IIIb, BPH, GERD, diverticulosis. Palliative ask to see for symptom management and goals of care.   SOCIAL HISTORY:     reports that he quit smoking about 35 years ago. His smoking use included cigarettes. He has never used smokeless tobacco. He reports that he does not drink alcohol and does not use drugs.  ADVANCE DIRECTIVES:   CODE STATUS: DNR  PAST MEDICAL HISTORY: Past Medical History:  Diagnosis Date   Abdominal aortic aneurysm (AAA) without rupture 06/23/2018   Age-related osteoporosis without current pathological fracture 07/14/2021   BPH (benign prostatic hyperplasia) 01/19/2010   Cervical radiculopathy 06/16/2018   Chronic anticoagulation 08/22/2018   Chronic bilateral low back pain without sciatica 07/18/2019   Chronic sinusitis 05/18/2021   Diverticulosis    Erectile dysfunction due to arterial insufficiency 04/23/2015   Esophageal stricture     Essential hypertension 05/01/2018   Estimated Creatinine Clearance: 44.1 mL/min (by C-G formula based on SCr of 1.35 mg/dL).   GERD (gastroesophageal reflux disease) 11/10/2010   Hearing loss    Hiatal hernia    Hyperlipidemia with target LDL less than 70 11/28/2012   The 10-year ASCVD risk score Denman George DC Jr., et al., 2013) is: 30.7%   Values used to calculate the score:     Age: 56 years     Sex: Male     Is Non-Hispanic African American: No     Diabetic: No     Tobacco smoker: No     Systolic Blood Pressure: 132 mmHg     Is BP treated: No     HDL Cholesterol: 30.8 mg/dL     Total Cholesterol: 183 mg/dL   Hypoglycemia    Iron deficiency anemia due to dietary causes 12/07/2018   Lung nodule seen on imaging study 06/23/2018   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   Estimated Creatinine Clearance: 51.7 mL/min (by C-G formula based on SCr of 1.15 mg/dL).   Syncope  presented with atrial fib converted with Diltiazem   Systemic inflammatory response syndrome 06/26/2018   Thrombocytopenia 01/19/2021    ALLERGIES:  is allergic to hydrocodone, monosodium glutamate, naproxen sodium, and other.  MEDICATIONS:  Current Outpatient Medications  Medication Sig Dispense Refill   acetaminophen (TYLENOL) 500 MG tablet Take 1,000 mg by mouth every 6 (six) hours as needed for  mild pain.     Calcium Carbonate (CALCIUM 500 PO) Take 500 mg by mouth 2 (two) times daily.     chlorhexidine (PERIDEX) 0.12 % solution Use as directed 15 mLs in the mouth or throat 2 (two) times daily.     denosumab (PROLIA) 60 MG/ML SOSY injection Inject 60 mg into the skin every 6 (six) months.     esomeprazole (NEXIUM) 40 MG capsule Take 1 capsule (40 mg total) by mouth daily before breakfast. 90 capsule 3   magic mouthwash (nystatin, diphenhydrAMINE, alum & mag hydroxide) suspension mixture Swish and swallow 5 mLs 3 (three) times daily as needed for mouth pain. (Patient not taking: Reported on 06/14/2022) 240 mL 2   mirtazapine (REMERON) 7.5 MG tablet Take 1 tablet (7.5 mg total) by mouth at bedtime. 90 tablet 3   Multiple Vitamins-Minerals (CENTRUM MULTI + OMEGA 3 PO) Take 1 tablet by mouth daily.      omega-3 acid ethyl esters (LOVAZA) 1 g capsule Take 2 capsules by mouth twice daily 360 capsule 0   rosuvastatin (CRESTOR) 10 MG tablet Take 1 tablet by mouth once daily 90 tablet 0   solifenacin (VESICARE) 10 MG tablet Take 1 tablet (10 mg total) by mouth at bedtime. 90 tablet 1   VITAMIN D PO Take 1,000 Units by mouth daily.     No current facility-administered medications for this visit.    VITAL SIGNS: There were no vitals taken for this visit. There were no vitals filed for this visit.  Estimated body mass index is 28.42 kg/m as calculated from the following:   Height as of 06/14/22: 5\' 5"  (1.651 m).   Weight as of 06/14/22: 170 lb 12.8 oz (77.5 kg).   PERFORMANCE STATUS (ECOG) : 1 - Symptomatic but completely ambulatory  Assessment NAD RRR AAO x4  IMPRESSION:  Frank [name] presents to clinic today for follow-up. No acute distress. Is doing great. 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 Appetite is good. Has gained some weight. Denies nausea, vomiting, constipation, and diarrhea.   PLAN:  Continue mirtazapine 7.5 mg at  bedtime Magic mouthwash swish and swallow as needed.  Provided much improvement. Ongoing symptom management support as needed. Patient knows to contact office.    Patient expressed understanding and was in agreement with this plan. He also understands that He can call the clinic at any time with any questions, concerns, or complaints.   Any controlled substances utilized were prescribed in the context of palliative care. PDMP has been reviewed.    Visit consisted of counseling and education dealing with the complex and emotionally intense issues of symptom management and palliative care in the setting of serious and potentially life-threatening illness.Greater than 50%  of this time was spent counseling and coordinating care related to the above assessment and plan.  Willette Alma, AGPCNP-BC  Palliative Medicine Team/[facility]  *Please note that this is a verbal dictation therefore any spelling or grammatical errors are due to the "Dragon Medical One" system interpretation.

## 2022-07-01 ENCOUNTER — Inpatient Hospital Stay: Payer: Medicare Other | Attending: Internal Medicine | Admitting: Internal Medicine

## 2022-07-01 ENCOUNTER — Encounter: Payer: Self-pay | Admitting: Medical Oncology

## 2022-07-01 ENCOUNTER — Encounter: Payer: Self-pay | Admitting: Nurse Practitioner

## 2022-07-01 ENCOUNTER — Inpatient Hospital Stay (HOSPITAL_BASED_OUTPATIENT_CLINIC_OR_DEPARTMENT_OTHER): Payer: Medicare Other | Admitting: Nurse Practitioner

## 2022-07-01 VITALS — BP 121/75 | HR 61 | Temp 97.5°F | Resp 16 | Wt 170.7 lb

## 2022-07-01 DIAGNOSIS — D693 Immune thrombocytopenic purpura: Secondary | ICD-10-CM | POA: Diagnosis not present

## 2022-07-01 DIAGNOSIS — R530 Neoplastic (malignant) related fatigue: Secondary | ICD-10-CM | POA: Diagnosis not present

## 2022-07-01 DIAGNOSIS — C642 Malignant neoplasm of left kidney, except renal pelvis: Secondary | ICD-10-CM | POA: Insufficient documentation

## 2022-07-01 DIAGNOSIS — Z905 Acquired absence of kidney: Secondary | ICD-10-CM | POA: Diagnosis not present

## 2022-07-01 DIAGNOSIS — C7800 Secondary malignant neoplasm of unspecified lung: Secondary | ICD-10-CM | POA: Insufficient documentation

## 2022-07-01 DIAGNOSIS — C7970 Secondary malignant neoplasm of unspecified adrenal gland: Secondary | ICD-10-CM | POA: Diagnosis not present

## 2022-07-01 DIAGNOSIS — Z79899 Other long term (current) drug therapy: Secondary | ICD-10-CM | POA: Insufficient documentation

## 2022-07-01 DIAGNOSIS — Z515 Encounter for palliative care: Secondary | ICD-10-CM

## 2022-07-01 DIAGNOSIS — Z7901 Long term (current) use of anticoagulants: Secondary | ICD-10-CM | POA: Insufficient documentation

## 2022-07-01 NOTE — Progress Notes (Signed)
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  He initially presented with localized disease in 2020.  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  He presented with a platelet count of less than 5 and subdural hematoma.  He had a complete response to IVIG and dexamethasone.    PRIOR THERAPY: 1) Status post left radical nephrectomy completed by Dr. Alvester Morin on October 18, 2018.  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  2) Ipilimumab 1 mg/kg and nivolumab 3 mg/kg started on October 01, 2021.  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   CURRENT THERAPY: Observation.  INTERVAL HISTORY: [name] 82 y.o. male returns to the clinic today for follow-up visit accompanied by his wife.  The patient is feeling fine today with no concerning complaints except for fatigue and hearing deficit.  He denied having any current chest pain, shortness of breath except with exertion with no cough or hemoptysis.  He has no nausea, vomiting, diarrhea or constipation.  He has no headache or visual changes.  He denied having any recent weight loss or night sweats.  He had repeat CT scan of the chest, abdomen and pelvis performed recently and he is here for evaluation and discussion of his scan results.  MEDICAL HISTORY: Past Medical History:  Diagnosis Date   Abdominal aortic aneurysm (AAA) without rupture 06/23/2018   Age-related osteoporosis without current pathological fracture 07/14/2021   BPH (benign prostatic hyperplasia) 01/19/2010   Cervical radiculopathy 06/16/2018   Chronic anticoagulation 08/22/2018   Chronic bilateral low back pain  without sciatica 07/18/2019   Chronic sinusitis 05/18/2021   Diverticulosis    Erectile dysfunction due to arterial insufficiency 04/23/2015   Esophageal stricture    Essential hypertension 05/01/2018   Estimated Creatinine Clearance: 44.1 mL/min (by C-G formula based on SCr of 1.35 mg/dL).   GERD (gastroesophageal reflux disease) 11/10/2010   Hearing loss    Hiatal hernia    Hyperlipidemia with target LDL less than 70 11/28/2012   The 10-year ASCVD risk score Denman George DC Jr., et al., 2013) is: 30.7%   Values used to calculate the score:     Age: 47 years     Sex: Male     Is Non-Hispanic African American: No     Diabetic: No     Tobacco smoker: No     Systolic Blood Pressure: 132 mmHg     Is BP treated: No     HDL Cholesterol: 30.8 mg/dL     Total Cholesterol: 183 mg/dL   Hypoglycemia    Iron deficiency anemia due to dietary causes 12/07/2018   Lung nodule seen on imaging study 06/23/2018   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 08/22/2018    a Medtronic Azure XT MRI conditional dual-chamber pacemaker for symptomatic sinus pauses, sick sinus syndrome, and syncope   Migraine headache    OAB (overactive bladder) 01/06/2016   PAF (paroxysmal atrial fibrillation)    Echo with normal LV function and mild LVH   Prediabetes 12/06/2018   Pure hyperglyceridemia 11/22/2007   Recurrent ventral incisional hernia 08/22/2018   Renal cell carcinoma of left kidney 01/15/2019   Sick  sinus syndrome 07/18/2018   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   Estimated Creatinine Clearance: 51.7 mL/min (by C-G formula based on SCr of 1.15 mg/dL).   Syncope    presented with atrial fib converted with Diltiazem   Systemic inflammatory response syndrome 06/26/2018   Thrombocytopenia 01/19/2021    ALLERGIES:  is allergic to hydrocodone, monosodium glutamate, naproxen sodium, and  other.  MEDICATIONS:  Current Outpatient Medications  Medication Sig Dispense Refill   acetaminophen (TYLENOL) 500 MG tablet Take 1,000 mg by mouth every 6 (six) hours as needed for mild pain.     Calcium Carbonate (CALCIUM 500 PO) Take 500 mg by mouth 2 (two) times daily.     chlorhexidine (PERIDEX) 0.12 % solution Use as directed 15 mLs in the mouth or throat 2 (two) times daily.     denosumab (PROLIA) 60 MG/ML SOSY injection Inject 60 mg into the skin every 6 (six) months.     esomeprazole (NEXIUM) 40 MG capsule Take 1 capsule (40 mg total) by mouth daily before breakfast. 90 capsule 3   magic mouthwash (nystatin, diphenhydrAMINE, alum & mag hydroxide) suspension mixture Swish and swallow 5 mLs 3 (three) times daily as needed for mouth pain. (Patient not taking: Reported on 06/14/2022) 240 mL 2   mirtazapine (REMERON) 7.5 MG tablet Take 1 tablet (7.5 mg total) by mouth at bedtime. 90 tablet 3   Multiple Vitamins-Minerals (CENTRUM MULTI + OMEGA 3 PO) Take 1 tablet by mouth daily.      omega-3 acid ethyl esters (LOVAZA) 1 g capsule Take 2 capsules by mouth twice daily 360 capsule 0   rosuvastatin (CRESTOR) 10 MG tablet Take 1 tablet by mouth once daily 90 tablet 0   solifenacin (VESICARE) 10 MG tablet Take 1 tablet (10 mg total) by mouth at bedtime. 90 tablet 1   VITAMIN D PO Take 1,000 Units by mouth daily.     No current facility-administered medications for this visit.    SURGICAL HISTORY:  Past Surgical History:  Procedure Laterality Date   CYSTOSCOPY WITH URETEROSCOPY AND STENT PLACEMENT Left 08/16/2018   Procedure: CYSTOSCOPY WITH LEFT RETROGRADE PYELOGRAM, BALLOON DILATION LEFT URETER, STENT PLACEMENT;  Surgeon: Crista Elliot, MD;  Location: [facility];  Service: Urology;  Laterality: Left;   HERNIA REPAIR     INSERT / REPLACE / REMOVE PACEMAKER     LAPAROSCOPIC ASSISTED VENTRAL HERNIA REPAIR N/A 10/18/2018   Procedure: LAPAROSCOPIC ASSISTED VENTRAL WALL HERNIA REPAIR WITH MESH;   Surgeon: Karie Soda, MD;  Location: [facility];  Service: General;  Laterality: N/A;   LAPAROSCOPIC NEPHRECTOMY, HAND ASSISTED Left 10/18/2018   Procedure: LEFT HAND ASSISTED LAPAROSCOPIC RADICAL NEPHRECTOMY WITH ADRENALECTOMY;  Surgeon: Crista Elliot, MD;  Location: [facility];  Service: Urology;  Laterality: Left;   LOOP RECORDER INSERTION N/A 04/26/2018   Procedure: LOOP RECORDER INSERTION;  Surgeon: Hillis Range, MD;  Location: [facility];  Service: Cardiovascular;  Laterality: N/A;   LOOP RECORDER REMOVAL N/A 07/18/2018   Procedure: LOOP RECORDER REMOVAL;  Surgeon: Hillis Range, MD;  Location: [facility];  Service: Cardiovascular;  Laterality: N/A;   NASAL SEPTUM SURGERY     PACEMAKER IMPLANT N/A 07/18/2018   Procedure: PACEMAKER IMPLANT;  Surgeon: Hillis Range, MD;  Location: [facility];  Service: Cardiovascular;  Laterality: N/A;   TONSILLECTOMY     TONSILLECTOMY  UMBILICAL HERNIA REPAIR  1993   WISDOM TOOTH EXTRACTION     about 82 years old    REVIEW OF SYSTEMS:  Constitutional: positive for fatigue Eyes: negative Ears, nose, mouth, throat, and face: negative Respiratory: negative Cardiovascular: negative Gastrointestinal: negative Genitourinary:negative Integument/breast: negative Hematologic/lymphatic: negative Musculoskeletal:positive for arthralgias Neurological: negative Behavioral/Psych: negative Endocrine: negative Allergic/Immunologic: negative   PHYSICAL EXAMINATION: General appearance: alert, cooperative, and fatigued Head: Normocephalic, without obvious abnormality, atraumatic Neck: no adenopathy, no JVD, supple, symmetrical, trachea midline, and thyroid not enlarged, symmetric, no tenderness/mass/nodules Lymph nodes: Cervical, supraclavicular, and axillary nodes normal. Resp: clear to auscultation bilaterally and normal percussion bilaterally Back: symmetric, no curvature. ROM normal. No CVA tenderness. Cardio: regular rate and  rhythm, S1, S2 normal, no murmur, click, rub or gallop GI: soft, non-tender; bowel sounds normal; no masses,  no organomegaly Extremities: extremities normal, atraumatic, no cyanosis or edema Neurologic: Alert and oriented X 3, normal strength and tone. Normal symmetric reflexes. Normal coordination and gait  ECOG PERFORMANCE STATUS: 1 - Symptomatic but completely ambulatory  There were no vitals taken for this visit.  LABORATORY DATA: Lab Results  Component Value Date   WBC 4.4 06/29/2022   HGB 13.3 06/29/2022   HCT 38.3 (L) 06/29/2022   MCV 94.3 06/29/2022   PLT 114 (L) 06/29/2022      Chemistry      Component Value Date/Time   NA 133 (L) 06/29/2022 1030   NA 135 07/21/2020 1211   K 4.2 06/29/2022 1030   K 3.8 11/09/2013 0000   CL 101 06/29/2022 1030   CL 105 11/09/2013 0000   CO2 28 06/29/2022 1030   CO2 26 11/09/2013 0000   BUN 15 06/29/2022 1030   BUN 18 07/21/2020 1211   CREATININE 0.99 06/29/2022 1030   CREATININE 0.66 11/09/2013 0000      Component Value Date/Time   CALCIUM 9.7 06/29/2022 1030   CALCIUM 8.7 11/09/2013 0000   ALKPHOS 37 (L) 06/29/2022 1030   ALKPHOS 55 11/09/2013 0000   AST 16 06/29/2022 1030   ALT 12 06/29/2022 1030   BILITOT 0.7 06/29/2022 1030       RADIOGRAPHIC STUDIES: CUP PACEART INCLINIC DEVICE CHECK  Result Date: 06/14/2022 Pacemaker check in clinic. Normal device function. Thresholds, sensing, impedances consistent with previous measurements. Device programmed to maximize longevity. No recent sustained AF. Episode of < 30 seconds in March. Most recent prolonged episode was  in 01/2022, > 9 hrs. Available EGMs of NSVT appear atrially driven /brief AF RVR. Device programmed at appropriate safety margins. Histogram distribution appropriate for patient activity level. Estimated longevity 10 yr, 6 mo. Patient enrolled in remote  follow-up. Patient education completed.   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  He initially presented with localized disease in 2020.  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  He presented with a platelet count of less than 5 and subdural hematoma.  He had a complete response to IVIG and dexamethasone.  He is status post left radical nephrectomy completed by Dr. Alvester Morin on October 18, 2018.  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  The patient was then treated with Ipilimumab 1 mg/kg and nivolumab 3 mg/kg started on October 01, 2021.  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 He  is currently on observation and feeling fine.  He has significant bleeding with low platelets in December 2023. He is feeling much better today with no concerning complaints. He had repeat CT scan of the chest, abdomen and pelvis performed recently.  I personally and independently reviewed the scan images and discussed the results with the patient and his wife. His scan has no concerning finding for disease progression but I will wait for the final report for confirmation. If the scan showed no concerning findings on the final report, I will see him back for follow-up visit in 4 months for evaluation with repeat CT scan of the chest, abdomen and pelvis for restaging of his disease. The patient was advised to call immediately if he has any concerning symptoms in the interval. The patient voices understanding of current disease status and treatment options and is in agreement with the current care plan.  All questions were answered. The patient knows to call the clinic with any problems, questions or concerns. We can certainly see the patient much sooner if necessary.  The total time spent in the appointment was 40 minutes.  Disclaimer: This note was dictated with voice  recognition software. Similar sounding words can inadvertently be transcribed and may not be corrected upon review.

## 2022-07-02 ENCOUNTER — Telehealth: Payer: Self-pay | Admitting: Internal Medicine

## 2022-07-06 ENCOUNTER — Telehealth: Payer: Self-pay | Admitting: Medical Oncology

## 2022-07-06 NOTE — Telephone Encounter (Signed)
-----   Message from Si Gaul, MD sent at 07/06/2022  4:31 PM EDT ----- We may need to move his scan to be done in 3 months rather than 6 months.  Please let the patient know his that there are some mild changes that we need to move the scan sooner.  Thank you ----- Message ----- From: Heilingoetter, Johnette Abraham, PA-C Sent: 07/02/2022   2:31 PM EDT To: Si Gaul, MD  The scan was not resulted when you saw him. Please review and message your nurse if you want to relay any further recommendations ----- Message ----- From: Interface, Rad Results In Sent: 07/02/2022   2:06 PM EDT To: Cassandra L Heilingoetter, PA-C

## 2022-07-06 NOTE — Telephone Encounter (Signed)
Pt confirmed appts for August. I reviewed scan impression with him.

## 2022-07-07 ENCOUNTER — Telehealth: Payer: Self-pay | Admitting: Internal Medicine

## 2022-07-07 NOTE — Telephone Encounter (Signed)
Scheduled per 5/07 in-basket message, patient has been called and voicemail was left.

## 2022-07-14 ENCOUNTER — Ambulatory Visit (INDEPENDENT_AMBULATORY_CARE_PROVIDER_SITE_OTHER): Payer: Medicare Other

## 2022-07-14 DIAGNOSIS — I495 Sick sinus syndrome: Secondary | ICD-10-CM

## 2022-07-14 LAB — CUP PACEART REMOTE DEVICE CHECK
Battery Remaining Longevity: 125 mo
Battery Voltage: 3.01 V
Brady Statistic AP VP Percent: 0.05 %
Brady Statistic AP VS Percent: 58.3 %
Brady Statistic AS VP Percent: 0.01 %
Brady Statistic AS VS Percent: 41.64 %
Brady Statistic RA Percent Paced: 58.46 %
Brady Statistic RV Percent Paced: 0.06 %
Date Time Interrogation Session: 20240514200428
Implantable Lead Connection Status: 753985
Implantable Lead Connection Status: 753985
Implantable Lead Implant Date: 20200519
Implantable Lead Implant Date: 20200519
Implantable Lead Location: 753859
Implantable Lead Location: 753860
Implantable Lead Model: 5076
Implantable Lead Model: 5076
Implantable Pulse Generator Implant Date: 20200519
Lead Channel Impedance Value: 304 Ohm
Lead Channel Impedance Value: 361 Ohm
Lead Channel Impedance Value: 475 Ohm
Lead Channel Impedance Value: 513 Ohm
Lead Channel Pacing Threshold Amplitude: 0.5 V
Lead Channel Pacing Threshold Amplitude: 0.5 V
Lead Channel Pacing Threshold Pulse Width: 0.4 ms
Lead Channel Pacing Threshold Pulse Width: 0.4 ms
Lead Channel Sensing Intrinsic Amplitude: 4.625 mV
Lead Channel Sensing Intrinsic Amplitude: 4.625 mV
Lead Channel Sensing Intrinsic Amplitude: 4.625 mV
Lead Channel Sensing Intrinsic Amplitude: 4.625 mV
Lead Channel Setting Pacing Amplitude: 1.5 V
Lead Channel Setting Pacing Amplitude: 2.5 V
Lead Channel Setting Pacing Pulse Width: 0.4 ms
Lead Channel Setting Sensing Sensitivity: 2 mV
Zone Setting Status: 755011
Zone Setting Status: 755011

## 2022-07-20 ENCOUNTER — Other Ambulatory Visit: Payer: Self-pay | Admitting: Internal Medicine

## 2022-07-20 DIAGNOSIS — E785 Hyperlipidemia, unspecified: Secondary | ICD-10-CM

## 2022-07-29 NOTE — Progress Notes (Signed)
Remote pacemaker transmission.   

## 2022-08-03 ENCOUNTER — Telehealth: Payer: Self-pay

## 2022-08-03 NOTE — Progress Notes (Deleted)
Palliative Medicine [facility]  Telephone:(336) [phone] Fax:(336) [phone]   Name: [name] Date: 08/03/2022 MRN: [id number]  DOB: 1940/09/20  Patient Care Team: [name], MD as PCP - General Nahser, Deloris Ping, MD as PCP - Cardiology (Cardiology) Lanier Prude, MD as PCP - Electrophysiology (Cardiology) Hilarie Fredrickson, MD as Consulting Physician (Gastroenterology) Karie Soda, MD as Consulting Physician (General Surgery) Hillis Range, MD (Inactive) as Consulting Physician (Cardiology) Crista Elliot, MD as Consulting Physician (Urology) Benjiman Core, MD as Consulting Physician (Oncology) Erlene Quan Vinnie Level, [facility] (Inactive) (Pharmacist)   I connected with Dorcas Mcmurray. on 08/03/22 at 11:30 AM EDT by phone and verified that I am speaking with the correct person using two identifiers.   I discussed the limitations, risks, security and privacy concerns of performing an evaluation and management service by telemedicine and the availability of in-person appointments. I also discussed with the patient that there may be a patient responsible charge related to this service. The patient expressed understanding and agreed to proceed.   Other persons participating in the visit and their role in the encounter: n/a   Patient's location: home  Provider's location: [facility]   Chief Complaint: f/u of symptom management   INTERVAL HISTORY: Frank Bernd. is a 82 y.o. 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  He was also noted to have pancreatic involvement. PMHx significant for HTN, HLD, PAF (on Eliquis), sick sinus syndrome (pacemaker in situ), AAA, CKD stage IIIb, BPH, GERD, diverticulosis. Palliative ask to see for symptom management and goals of care.   SOCIAL HISTORY:     reports that he quit smoking about 35 years ago. His smoking use included  cigarettes. He has never used smokeless tobacco. He reports that he does not drink alcohol and does not use drugs.  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   GERD (gastroesophageal reflux disease) 11/10/2010   Hearing loss    Hiatal hernia    Hyperlipidemia with target LDL less than 70 11/28/2012   The 10-year ASCVD risk score Denman George DC Jr., et al., 2013) is: 30.7%   Values used to calculate the score:     Age: 39 years     Sex: Male     Is Non-Hispanic African American: No     Diabetic: No     Tobacco smoker: No     Systolic Blood Pressure: 132 mmHg     Is BP treated: No     HDL Cholesterol: 30.8 mg/dL     Total Cholesterol: 183 mg/dL   Hypoglycemia    Iron deficiency anemia due to dietary causes 12/07/2018   Lung nodule seen on imaging study 06/23/2018   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   Estimated Creatinine Clearance: 51.7 mL/min (by C-G formula based on SCr of 1.15 mg/dL).   Syncope    presented with atrial fib converted with Diltiazem   Systemic inflammatory response syndrome 06/26/2018   Thrombocytopenia 01/19/2021    ALLERGIES:  is allergic to hydrocodone, monosodium glutamate, naproxen sodium, and other.  MEDICATIONS:  Current Outpatient Medications  Medication Sig Dispense Refill   acetaminophen (TYLENOL) 500 MG tablet Take 1,000 mg by mouth every 6 (six) hours as needed for mild pain.     Calcium Carbonate (CALCIUM 500 PO) Take 500 mg by mouth 2 (two) times daily.     chlorhexidine (PERIDEX) 0.12 % solution Use as directed 15 mLs in the mouth or throat 2 (two) times daily.     denosumab (PROLIA) 60 MG/ML SOSY injection Inject 60 mg into the skin every 6 (six) months.     esomeprazole (NEXIUM) 40 MG capsule Take 1 capsule (40 mg total) by mouth daily before breakfast. 90 capsule 3   magic mouthwash (nystatin, diphenhydrAMINE, alum & mag hydroxide) suspension mixture Swish and swallow 5 mLs 3 (three) times daily as needed for mouth pain. (Patient not taking: Reported on 06/14/2022) 240 mL 2   mirtazapine (REMERON) 7.5 MG tablet Take 1 tablet (7.5 mg total) by mouth at bedtime. 90 tablet 3   Multiple Vitamins-Minerals (CENTRUM MULTI + OMEGA 3 PO) Take 1 tablet by mouth daily.      omega-3 acid ethyl esters (LOVAZA) 1 g capsule Take 2 capsules by mouth twice daily 360 capsule 0   rosuvastatin (CRESTOR) 10 MG tablet Take 1 tablet by mouth once daily 90 tablet 0   solifenacin (VESICARE) 10 MG tablet Take 1 tablet (10 mg total) by mouth at bedtime. 90 tablet 1   VITAMIN D PO Take 1,000 Units by mouth daily.     No current facility-administered medications for this visit.    VITAL  SIGNS: There were no vitals taken for this visit. There were no vitals filed for this visit.  Estimated body mass index is 28.41 kg/m as calculated from the following:   Height as of 06/14/22: 5\' 5"  (1.651 m).   Weight as of 07/01/22: 170 lb 11.2 oz (77.4 kg).   PERFORMANCE STATUS (ECOG) : 1 - Symptomatic but completely ambulatory  Assessment NAD RRR AAO x4  IMPRESSION:    PLAN:  Continue mirtazapine 7.5 mg at bedtime Magic mouthwash swish and swallow as needed.  Provided much improvement. Ongoing symptom management support as needed. Patient knows to contact office.    Patient expressed understanding and was in agreement with this plan. He also understands that He can call the clinic at any time with any questions, concerns, or complaints.   Any controlled substances utilized were prescribed in the context of palliative care. PDMP has been reviewed.    Visit consisted of counseling and education dealing with the complex and emotionally intense issues of symptom management and palliative  care in the setting of serious and potentially life-threatening illness.Greater than 50%  of this time was spent counseling and coordinating care related to the above assessment and plan.  Willette Alma, AGPCNP-BC  Palliative Medicine Team/[facility]  *Please note that this is a verbal dictation therefore any spelling or grammatical errors are due to the "Dragon [employer] One" system interpretation.

## 2022-08-03 NOTE — Telephone Encounter (Signed)
Pt called to cancel his palliative appt reporting that he is doing "real well" currently and does not think he needs to see palliative care. Pt verbalized to call sooner if needs arise, Lowella Bandy, NP made aware.

## 2022-08-05 ENCOUNTER — Telehealth: Payer: Medicare Other | Admitting: Nurse Practitioner

## 2022-08-17 ENCOUNTER — Telehealth: Payer: Self-pay | Admitting: Internal Medicine

## 2022-08-17 ENCOUNTER — Telehealth: Payer: Self-pay

## 2022-08-17 NOTE — Telephone Encounter (Signed)
Patient would like to know if his Prolia shot has been approved.  Please call patient and let him know.  Patient's number:  [phone]

## 2022-08-17 NOTE — Telephone Encounter (Signed)
Prolia VOB initiated via [email or web address]  Last Prolia inj: 02/04/22 Next Prolia inj DUE:  07/2022

## 2022-08-18 ENCOUNTER — Other Ambulatory Visit (HOSPITAL_COMMUNITY): Payer: Self-pay

## 2022-08-18 NOTE — Telephone Encounter (Signed)
Pt ready for scheduling for Prolia on or after : 08/18/2022  Out-of-pocket cost due at time of visit: $327  Primary: [employer] - Medicare Prolia co-insurance: 20% Admin fee co-insurance: $20  Secondary: N/A Prolia co-insurance:  Admin fee co-insurance:   Medical Benefit Details: Date Benefits were checked: 08/17/22 Deductible: no/ Coinsurance: 20%/ Admin Fee: $20  Prior Auth: Approved PA# [id number] Expiration Date: 08/18/22 to 08/18/23   Pharmacy benefit: Copay $300 If patient wants fill through the pharmacy benefit please send prescription to: [facility], and include estimated need by date in rx notes. Pharmacy will ship medication directly to the office.  Patient NOT eligible for Prolia Copay Card. Copay Card can make patient's cost as little as $25. Link to apply: [email or web address]://[email or web address]  ** This summary of benefits is an estimation of the patient's out-of-pocket cost. Exact cost may very based on individual plan coverage.

## 2022-08-23 ENCOUNTER — Ambulatory Visit: Payer: Medicare Other

## 2022-08-23 DIAGNOSIS — M810 Age-related osteoporosis without current pathological fracture: Secondary | ICD-10-CM

## 2022-08-23 MED ORDER — DENOSUMAB 60 MG/ML ~~LOC~~ SOSY
60.0000 mg | PREFILLED_SYRINGE | Freq: Once | SUBCUTANEOUS | Status: AC
Start: 2022-08-23 — End: 2022-08-23
  Administered 2022-08-23: 60 mg via SUBCUTANEOUS

## 2022-08-23 NOTE — Progress Notes (Signed)
After obtaining consent, and per orders of Dr. [name], injection of Prolia given by Mariangela Heldt P Diamonique Ruedas. Patient instructed to report any adverse reaction to me immediately.  

## 2022-08-24 ENCOUNTER — Other Ambulatory Visit: Payer: Self-pay | Admitting: Internal Medicine

## 2022-08-24 DIAGNOSIS — E781 Pure hyperglyceridemia: Secondary | ICD-10-CM

## 2022-09-21 DIAGNOSIS — L814 Other melanin hyperpigmentation: Secondary | ICD-10-CM | POA: Diagnosis not present

## 2022-09-21 DIAGNOSIS — L578 Other skin changes due to chronic exposure to nonionizing radiation: Secondary | ICD-10-CM | POA: Diagnosis not present

## 2022-09-21 DIAGNOSIS — L820 Inflamed seborrheic keratosis: Secondary | ICD-10-CM | POA: Diagnosis not present

## 2022-09-30 ENCOUNTER — Ambulatory Visit (HOSPITAL_COMMUNITY)
Admission: RE | Admit: 2022-09-30 | Discharge: 2022-09-30 | Disposition: A | Discharge status: Home / Self Care | Payer: Medicare Other | Source: Ambulatory Visit | Attending: Internal Medicine | Admitting: Internal Medicine

## 2022-09-30 ENCOUNTER — Other Ambulatory Visit: Payer: Self-pay

## 2022-09-30 ENCOUNTER — Inpatient Hospital Stay: Payer: Medicare Other | Attending: Internal Medicine

## 2022-09-30 DIAGNOSIS — Z905 Acquired absence of kidney: Secondary | ICD-10-CM | POA: Insufficient documentation

## 2022-09-30 DIAGNOSIS — Z7901 Long term (current) use of anticoagulants: Secondary | ICD-10-CM | POA: Insufficient documentation

## 2022-09-30 DIAGNOSIS — C7800 Secondary malignant neoplasm of unspecified lung: Secondary | ICD-10-CM | POA: Insufficient documentation

## 2022-09-30 DIAGNOSIS — D693 Immune thrombocytopenic purpura: Secondary | ICD-10-CM | POA: Insufficient documentation

## 2022-09-30 DIAGNOSIS — Z79899 Other long term (current) drug therapy: Secondary | ICD-10-CM | POA: Insufficient documentation

## 2022-09-30 DIAGNOSIS — C642 Malignant neoplasm of left kidney, except renal pelvis: Secondary | ICD-10-CM | POA: Insufficient documentation

## 2022-09-30 DIAGNOSIS — R682 Dry mouth, unspecified: Secondary | ICD-10-CM | POA: Insufficient documentation

## 2022-09-30 DIAGNOSIS — C7970 Secondary malignant neoplasm of unspecified adrenal gland: Secondary | ICD-10-CM | POA: Insufficient documentation

## 2022-09-30 DIAGNOSIS — I700 Atherosclerosis of aorta: Secondary | ICD-10-CM | POA: Diagnosis not present

## 2022-09-30 LAB — CBC WITH DIFFERENTIAL (CANCER CENTER ONLY)
Abs Immature Granulocytes: 0.04 10*3/uL (ref 0.00–0.07)
Basophils Absolute: 0 10*3/uL (ref 0.0–0.1)
Basophils Relative: 0 %
Eosinophils Absolute: 0.1 10*3/uL (ref 0.0–0.5)
Eosinophils Relative: 1 %
HCT: 37.9 % — ABNORMAL LOW (ref 39.0–52.0)
Hemoglobin: 13.6 g/dL (ref 13.0–17.0)
Immature Granulocytes: 1 %
Lymphocytes Relative: 15 %
Lymphs Abs: 1 10*3/uL (ref 0.7–4.0)
MCH: 33.8 pg (ref 26.0–34.0)
MCHC: 35.9 g/dL (ref 30.0–36.0)
MCV: 94.3 fL (ref 80.0–100.0)
Monocytes Absolute: 0.5 10*3/uL (ref 0.1–1.0)
Monocytes Relative: 7 %
Neutro Abs: 4.7 10*3/uL (ref 1.7–7.7)
Neutrophils Relative %: 76 %
Platelet Count: 95 10*3/uL — ABNORMAL LOW (ref 150–400)
RBC: 4.02 MIL/uL — ABNORMAL LOW (ref 4.22–5.81)
RDW: 13.7 % (ref 11.5–15.5)
WBC Count: 6.3 10*3/uL (ref 4.0–10.5)
nRBC: 0 % (ref 0.0–0.2)

## 2022-09-30 LAB — CMP (CANCER CENTER ONLY)
ALT: 15 U/L (ref 0–44)
AST: 18 U/L (ref 15–41)
Albumin: 4.1 g/dL (ref 3.5–5.0)
Alkaline Phosphatase: 45 U/L (ref 38–126)
Anion gap: 5 (ref 5–15)
BUN: 17 mg/dL (ref 8–23)
CO2: 27 mmol/L (ref 22–32)
Calcium: 9 mg/dL (ref 8.9–10.3)
Chloride: 102 mmol/L (ref 98–111)
Creatinine: 1.02 mg/dL (ref 0.61–1.24)
GFR, Estimated: >60 mL/min (ref >60)
Glucose, Bld: 109 mg/dL — ABNORMAL HIGH (ref 70–99)
Potassium: 4.7 mmol/L (ref 3.5–5.1)
Sodium: 134 mmol/L — ABNORMAL LOW (ref 135–145)
Total Bilirubin: 0.5 mg/dL (ref 0.3–1.2)
Total Protein: 6.8 g/dL (ref 6.5–8.1)

## 2022-09-30 MED ORDER — HEPARIN SOD (PORK) LOCK FLUSH 100 UNIT/ML IV SOLN: 500.0000 [IU] | Freq: Once | INTRAVENOUS | Status: DC

## 2022-09-30 MED ORDER — IOHEXOL 300 MG/ML  SOLN
100.0000 mL | Freq: Once | INTRAMUSCULAR | Status: AC | PRN
  Administered 2022-09-30: 100 mL via INTRAVENOUS

## 2022-10-01 NOTE — Progress Notes (Unsigned)
Palliative Medicine [facility]  [phone] [phone]) [phone]   Name: [name] Date: 10/01/2022 MRN: [id number]  DOB: 1940-05-31  Patient Care Team: [name], MD as PCP - General Nahser, Deloris Ping, MD as PCP - Cardiology (Cardiology) Lanier Prude, MD as PCP - Electrophysiology (Cardiology) Hilarie Fredrickson, MD as Consulting Physician (Gastroenterology) Karie Soda, MD as Consulting Physician (General Surgery) Hillis Range, MD (Inactive) as Consulting Physician (Cardiology) Crista Elliot, MD as Consulting Physician (Urology) Benjiman Core, MD (Inactive) as Consulting Physician (Oncology) Ellin Saba, [facility] (Inactive) (Pharmacist)   INTERVAL HISTORY: Curley Hogen. is a 82 y.o. 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  He was also noted to have pancreatic involvement. PMHx significant for HTN, HLD, PAF (on Eliquis), sick sinus syndrome (pacemaker in situ), AAA, CKD stage IIIb, BPH, GERD, diverticulosis. Palliative ask to see for symptom management and goals of care.   SOCIAL HISTORY:     reports that he quit smoking about 35 years ago. His smoking use included cigarettes. He has never used smokeless tobacco. He reports that he does not drink alcohol and does not use drugs.  ADVANCE DIRECTIVES:   CODE STATUS: DNR  PAST MEDICAL HISTORY: Past Medical History:  Diagnosis Date   Abdominal aortic aneurysm (AAA) without rupture 06/23/2018   Age-related osteoporosis without current pathological fracture 07/14/2021   BPH (benign prostatic hyperplasia) 01/19/2010   Cervical radiculopathy 06/16/2018   Chronic anticoagulation 08/22/2018   Chronic bilateral low back pain without sciatica 07/18/2019   Chronic sinusitis 05/18/2021   Diverticulosis    Erectile dysfunction due to arterial insufficiency 04/23/2015   Esophageal  stricture    Essential hypertension 05/01/2018   Estimated Creatinine Clearance: 44.1 mL/min (by C-G formula based on SCr of 1.35 mg/dL).   GERD (gastroesophageal reflux disease) 11/10/2010   Hearing loss    Hiatal hernia    Hyperlipidemia with target LDL less than 70 11/28/2012   The 10-year ASCVD risk score Denman George DC Jr., et al., 2013) is: 30.7%   Values used to calculate the score:     Age: 80 years     Sex: Male     Is Non-Hispanic African American: No     Diabetic: No     Tobacco smoker: No     Systolic Blood Pressure: 132 mmHg     Is BP treated: No     HDL Cholesterol: 30.8 mg/dL     Total Cholesterol: 183 mg/dL   Hypoglycemia    Iron deficiency anemia due to dietary causes 12/07/2018   Lung nodule seen on imaging study 06/23/2018   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   Estimated Creatinine Clearance: 51.7 mL/min (by C-G formula based on SCr of 1.15 mg/dL).   Syncope  presented with atrial fib converted with Diltiazem   Systemic inflammatory response syndrome 06/26/2018   Thrombocytopenia 01/19/2021    ALLERGIES:  is allergic to hydrocodone, monosodium glutamate, naproxen sodium, and other.  MEDICATIONS:  Current Outpatient Medications  Medication Sig Dispense Refill   acetaminophen (TYLENOL) 500 MG tablet Take 1,000 mg by mouth every 6 (six) hours  as needed for mild pain.     Calcium Carbonate (CALCIUM 500 PO) Take 500 mg by mouth 2 (two) times daily.     chlorhexidine (PERIDEX) 0.12 % solution Use as directed 15 mLs in the mouth or throat 2 (two) times daily.     denosumab (PROLIA) 60 MG/ML SOSY injection Inject 60 mg into the skin every 6 (six) months.     esomeprazole (NEXIUM) 40 MG capsule Take 1 capsule (40 mg total) by mouth daily before breakfast. 90 capsule 3   magic mouthwash (nystatin, diphenhydrAMINE, alum & mag hydroxide) suspension mixture Swish and swallow 5 mLs 3 (three) times daily as needed for mouth pain. (Patient not taking: Reported on 06/14/2022) 240 mL 2   mirtazapine (REMERON) 7.5 MG tablet Take 1 tablet (7.5 mg total) by mouth at bedtime. 90 tablet 3   Multiple Vitamins-Minerals (CENTRUM MULTI + OMEGA 3 PO) Take 1 tablet by mouth daily.      omega-3 acid ethyl esters (LOVAZA) 1 g capsule Take 2 capsules by mouth twice daily 360 capsule 0   rosuvastatin (CRESTOR) 10 MG tablet Take 1 tablet by mouth once daily 90 tablet 0   solifenacin (VESICARE) 10 MG tablet Take 1 tablet (10 mg total) by mouth at bedtime. 90 tablet 1   VITAMIN D PO Take 1,000 Units by mouth daily.     No current facility-administered medications for this visit.    VITAL SIGNS: There were no vitals taken for this visit. There were no vitals filed for this visit.  Estimated body mass index is 28.41 kg/m as calculated from the following:   Height as of 06/14/22: 5\' 5"  (1.651 m).   Weight as of 07/01/22: 170 lb 11.2 oz (77.4 kg).   PERFORMANCE STATUS (ECOG) : 1 - Symptomatic but completely ambulatory  Assessment NAD RRR AAO x4  IMPRESSION:    PLAN:  Continue mirtazapine 7.5 mg at bedtime Magic mouthwash swish and swallow as needed.  Provided much improvement. Ongoing symptom management support as needed. Patient knows to contact office.    Patient expressed understanding and was in agreement with this plan. He also understands that  He can call the clinic at any time with any questions, concerns, or complaints.   Any controlled substances utilized were prescribed in the context of palliative care. PDMP has been reviewed.    Visit consisted of counseling and education dealing with the complex and emotionally intense issues of symptom management and palliative care in the setting of serious and potentially life-threatening illness.Greater than 50%  of this time was spent counseling and coordinating care related to the above assessment and plan.  Willette Alma, AGPCNP-BC  Palliative Medicine Team/[facility]  *Please note that this is a verbal dictation therefore any spelling or grammatical errors are due to the "[employer] One" system interpretation.

## 2022-10-04 ENCOUNTER — Other Ambulatory Visit: Payer: Self-pay | Admitting: *Deleted

## 2022-10-04 ENCOUNTER — Inpatient Hospital Stay: Payer: Medicare Other | Admitting: Nurse Practitioner

## 2022-10-04 ENCOUNTER — Other Ambulatory Visit: Payer: Self-pay

## 2022-10-04 ENCOUNTER — Inpatient Hospital Stay: Payer: Medicare Other | Admitting: Internal Medicine

## 2022-10-04 VITALS — BP 138/73 | HR 59 | Temp 97.5°F | Resp 16 | Ht 65.0 in | Wt 179.4 lb

## 2022-10-04 DIAGNOSIS — B370 Candidal stomatitis: Secondary | ICD-10-CM

## 2022-10-04 DIAGNOSIS — D693 Immune thrombocytopenic purpura: Secondary | ICD-10-CM | POA: Diagnosis not present

## 2022-10-04 DIAGNOSIS — C642 Malignant neoplasm of left kidney, except renal pelvis: Secondary | ICD-10-CM | POA: Diagnosis not present

## 2022-10-04 DIAGNOSIS — C7970 Secondary malignant neoplasm of unspecified adrenal gland: Secondary | ICD-10-CM | POA: Diagnosis not present

## 2022-10-04 DIAGNOSIS — Z905 Acquired absence of kidney: Secondary | ICD-10-CM | POA: Diagnosis not present

## 2022-10-04 DIAGNOSIS — C649 Malignant neoplasm of unspecified kidney, except renal pelvis: Secondary | ICD-10-CM

## 2022-10-04 DIAGNOSIS — R682 Dry mouth, unspecified: Secondary | ICD-10-CM | POA: Diagnosis not present

## 2022-10-04 DIAGNOSIS — C3490 Malignant neoplasm of unspecified part of unspecified bronchus or lung: Secondary | ICD-10-CM | POA: Diagnosis not present

## 2022-10-04 DIAGNOSIS — Z79899 Other long term (current) drug therapy: Secondary | ICD-10-CM | POA: Diagnosis not present

## 2022-10-04 DIAGNOSIS — Z7901 Long term (current) use of anticoagulants: Secondary | ICD-10-CM | POA: Diagnosis not present

## 2022-10-04 DIAGNOSIS — C7800 Secondary malignant neoplasm of unspecified lung: Secondary | ICD-10-CM | POA: Diagnosis not present

## 2022-10-04 MED ORDER — DIPHENHYDRAMINE HCL 12.5 MG/5ML PO LIQD
5.0000 mL | Freq: Three times a day (TID) | ORAL | 2 refills | Status: DC | PRN
Start: 2022-10-04 — End: 2022-10-04

## 2022-10-04 MED ORDER — NYSTATIN 100000 UNIT/ML MT SUSP
5.0000 mL | Freq: Three times a day (TID) | ORAL | 2 refills | Status: DC | PRN
Start: 2022-10-04 — End: 2023-01-31

## 2022-10-04 NOTE — Progress Notes (Signed)
[facility] [phone]   [phone]  OFFICE PROGRESS NOTE  Frank Grandchild, MD [address] 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  He initially presented with localized disease in 2020.  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  He presented with a platelet count of less than 5 and subdural hematoma.  He had a complete response to IVIG and dexamethasone.    PRIOR THERAPY: 1) Status post left radical nephrectomy completed by Dr. Alvester Morin on October 18, 2018.  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  2) Ipilimumab 1 mg/kg and nivolumab 3 mg/kg started on October 01, 2021.  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   CURRENT THERAPY: Observation.  INTERVAL HISTORY: [name] 82 y.o. male returns to the clinic today for follow-up visit accompanied by his wife.  The patient is feeling fine except for the dry mouth and requesting refill of Magic mouthwash.  He denied having any current chest pain, shortness of breath except with exertion with no cough or hemoptysis.  He has no nausea, vomiting, diarrhea or constipation.  He has no headache or visual changes.  He denied having any bleeding, bruises or ecchymosis.  He had repeat CT scan of the chest, abdomen and pelvis performed recently and he is here for evaluation and discussion of his scan results.  MEDICAL HISTORY: Past Medical History:  Diagnosis Date   Abdominal aortic aneurysm (AAA) without rupture 06/23/2018   Age-related osteoporosis without current pathological fracture 07/14/2021   BPH (benign prostatic hyperplasia) 01/19/2010   Cervical radiculopathy 06/16/2018   Chronic anticoagulation 08/22/2018   Chronic bilateral low back pain without  sciatica 07/18/2019   Chronic sinusitis 05/18/2021   Diverticulosis    Erectile dysfunction due to arterial insufficiency 04/23/2015   Esophageal stricture    Essential hypertension 05/01/2018   Estimated Creatinine Clearance: 44.1 mL/min (by C-G formula based on SCr of 1.35 mg/dL).   GERD (gastroesophageal reflux disease) 11/10/2010   Hearing loss    Hiatal hernia    Hyperlipidemia with target LDL less than 70 11/28/2012   The 10-year ASCVD risk score Denman George DC Jr., et al., 2013) is: 30.7%   Values used to calculate the score:     Age: 64 years     Sex: Male     Is Non-Hispanic African American: No     Diabetic: No     Tobacco smoker: No     Systolic Blood Pressure: 132 mmHg     Is BP treated: No     HDL Cholesterol: 30.8 mg/dL     Total Cholesterol: 183 mg/dL   Hypoglycemia    Iron deficiency anemia due to dietary causes 12/07/2018   Lung nodule seen on imaging study 06/23/2018   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 08/22/2018    a Medtronic Azure XT MRI conditional dual-chamber pacemaker for symptomatic sinus pauses, sick sinus syndrome, and syncope   Migraine headache    OAB (overactive bladder) 01/06/2016   PAF (paroxysmal atrial fibrillation)    Echo with normal LV function and mild LVH   Prediabetes 12/06/2018   Pure hyperglyceridemia 11/22/2007   Recurrent ventral incisional hernia 08/22/2018   Renal cell carcinoma of left kidney 01/15/2019   Sick sinus syndrome  07/18/2018   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   Estimated Creatinine Clearance: 51.7 mL/min (by C-G formula based on SCr of 1.15 mg/dL).   Syncope    presented with atrial fib converted with Diltiazem   Systemic inflammatory response syndrome 06/26/2018   Thrombocytopenia 01/19/2021    ALLERGIES:  is allergic to hydrocodone, monosodium glutamate, naproxen sodium, and other.  MEDICATIONS:   Current Outpatient Medications  Medication Sig Dispense Refill   acetaminophen (TYLENOL) 500 MG tablet Take 1,000 mg by mouth every 6 (six) hours as needed for mild pain.     Calcium Carbonate (CALCIUM 500 PO) Take 500 mg by mouth 2 (two) times daily.     chlorhexidine (PERIDEX) 0.12 % solution Use as directed 15 mLs in the mouth or throat 2 (two) times daily.     denosumab (PROLIA) 60 MG/ML SOSY injection Inject 60 mg into the skin every 6 (six) months.     esomeprazole (NEXIUM) 40 MG capsule Take 1 capsule (40 mg total) by mouth daily before breakfast. 90 capsule 3   magic mouthwash (nystatin, diphenhydrAMINE, alum & mag hydroxide) suspension mixture Swish and swallow 5 mLs 3 (three) times daily as needed for mouth pain. (Patient not taking: Reported on 06/14/2022) 240 mL 2   mirtazapine (REMERON) 7.5 MG tablet Take 1 tablet (7.5 mg total) by mouth at bedtime. 90 tablet 3   Multiple Vitamins-Minerals (CENTRUM MULTI + OMEGA 3 PO) Take 1 tablet by mouth daily.      omega-3 acid ethyl esters (LOVAZA) 1 g capsule Take 2 capsules by mouth twice daily 360 capsule 0   rosuvastatin (CRESTOR) 10 MG tablet Take 1 tablet by mouth once daily 90 tablet 0   solifenacin (VESICARE) 10 MG tablet Take 1 tablet (10 mg total) by mouth at bedtime. 90 tablet 1   VITAMIN D PO Take 1,000 Units by mouth daily.     No current facility-administered medications for this visit.    SURGICAL HISTORY:  Past Surgical History:  Procedure Laterality Date   CYSTOSCOPY WITH URETEROSCOPY AND STENT PLACEMENT Left 08/16/2018   Procedure: CYSTOSCOPY WITH LEFT RETROGRADE PYELOGRAM, BALLOON DILATION LEFT URETER, STENT PLACEMENT;  Surgeon: Crista Elliot, MD;  Location: [facility];  Service: Urology;  Laterality: Left;   HERNIA REPAIR     INSERT / REPLACE / REMOVE PACEMAKER     LAPAROSCOPIC ASSISTED VENTRAL HERNIA REPAIR N/A 10/18/2018   Procedure: LAPAROSCOPIC ASSISTED VENTRAL WALL HERNIA REPAIR WITH MESH;  Surgeon: Karie Soda,  MD;  Location: [facility];  Service: General;  Laterality: N/A;   LAPAROSCOPIC NEPHRECTOMY, HAND ASSISTED Left 10/18/2018   Procedure: LEFT HAND ASSISTED LAPAROSCOPIC RADICAL NEPHRECTOMY WITH ADRENALECTOMY;  Surgeon: Crista Elliot, MD;  Location: [facility];  Service: Urology;  Laterality: Left;   LOOP RECORDER INSERTION N/A 04/26/2018   Procedure: LOOP RECORDER INSERTION;  Surgeon: Hillis Range, MD;  Location: [facility];  Service: Cardiovascular;  Laterality: N/A;   LOOP RECORDER REMOVAL N/A 07/18/2018   Procedure: LOOP RECORDER REMOVAL;  Surgeon: Hillis Range, MD;  Location: [facility];  Service: Cardiovascular;  Laterality: N/A;   NASAL SEPTUM SURGERY     PACEMAKER IMPLANT N/A 07/18/2018   Procedure: PACEMAKER IMPLANT;  Surgeon: Hillis Range, MD;  Location: [facility];  Service: Cardiovascular;  Laterality: N/A;   TONSILLECTOMY     TONSILLECTOMY     UMBILICAL  HERNIA REPAIR  1993   WISDOM TOOTH EXTRACTION     about 82 years old    REVIEW OF SYSTEMS:  Constitutional: positive for fatigue Eyes: negative Ears, nose, mouth, throat, and face: positive for dry mouth Respiratory: negative Cardiovascular: negative Gastrointestinal: negative Genitourinary:negative Integument/breast: negative Hematologic/lymphatic: negative Musculoskeletal:positive for arthralgias Neurological: negative Behavioral/Psych: negative Endocrine: negative Allergic/Immunologic: negative   PHYSICAL EXAMINATION: General appearance: alert, cooperative, and fatigued Head: Normocephalic, without obvious abnormality, atraumatic Neck: no adenopathy, no JVD, supple, symmetrical, trachea midline, and thyroid not enlarged, symmetric, no tenderness/mass/nodules Lymph nodes: Cervical, supraclavicular, and axillary nodes normal. Resp: clear to auscultation bilaterally and normal percussion bilaterally Back: symmetric, no curvature. ROM normal. No CVA tenderness. Cardio: regular rate and rhythm, S1, S2  normal, no murmur, click, rub or gallop GI: soft, non-tender; bowel sounds normal; no masses,  no organomegaly Extremities: extremities normal, atraumatic, no cyanosis or edema Neurologic: Alert and oriented X 3, normal strength and tone. Normal symmetric reflexes. Normal coordination and gait  ECOG PERFORMANCE STATUS: 1 - Symptomatic but completely ambulatory  Blood pressure 138/73, pulse (!) 59, temperature (!) 97.5 F (36.4 C), temperature source Oral, resp. rate 16, height 5\' 5"  (1.651 m), weight 179 lb 6.4 oz (81.4 kg), SpO2 99%.  LABORATORY DATA: Lab Results  Component Value Date   WBC 6.3 09/30/2022   HGB 13.6 09/30/2022   HCT 37.9 (L) 09/30/2022   MCV 94.3 09/30/2022   PLT 95 (L) 09/30/2022      Chemistry      Component Value Date/Time   NA 134 (L) 09/30/2022 1033   NA 135 07/21/2020 1211   K 4.7 09/30/2022 1033   K 3.8 11/09/2013 0000   CL 102 09/30/2022 1033   CL 105 11/09/2013 0000   CO2 27 09/30/2022 1033   CO2 26 11/09/2013 0000   BUN 17 09/30/2022 1033   BUN 18 07/21/2020 1211   CREATININE 1.02 09/30/2022 1033   CREATININE 0.66 11/09/2013 0000      Component Value Date/Time   CALCIUM 9.0 09/30/2022 1033   CALCIUM 8.7 11/09/2013 0000   ALKPHOS 45 09/30/2022 1033   ALKPHOS 55 11/09/2013 0000   AST 18 09/30/2022 1033   ALT 15 09/30/2022 1033   BILITOT 0.5 09/30/2022 1033       RADIOGRAPHIC STUDIES: CT Chest W Contrast  Result Date: 09/30/2022 CLINICAL DATA:  Left renal cell carcinoma. Staging. History of nephrectomy and immunotherapy. * Tracking Code: BO * EXAM: CT CHEST, ABDOMEN, AND PELVIS WITH CONTRAST TECHNIQUE: Multidetector CT imaging of the chest, abdomen and pelvis was performed following the standard protocol during bolus administration of intravenous contrast. RADIATION DOSE REDUCTION: This exam was performed according to the departmental dose-optimization program which includes automated exposure control, adjustment of the mA and/or kV according  to patient size and/or use of iterative reconstruction technique. CONTRAST:  OMNIPAQUE IOHEXOL 300 MG/ML  SOLN COMPARISON:  06/29/2022 FINDINGS: CT CHEST FINDINGS Cardiovascular: Aortic atherosclerosis. Tortuous thoracic aorta. Moderate cardiomegaly, without pericardial effusion. Pacer. Multivessel coronary artery atherosclerosis. No central pulmonary embolism, on this non-dedicated study. Mediastinum/Nodes: No supraclavicular adenopathy. No mediastinal or hilar adenopathy. Small hiatal hernia. Lungs/Pleura: No pleural fluid. Moderate centrilobular and paraseptal emphysema. 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 Lower lung predominant bilateral pulmonary nodules are primarily similar. Some nodules are slightly larger. For example, a right middle lobe 11 mm nodule on 100/508 measures 10 mm on the prior when remeasured in a  similar fashion. A subpleural lingular nodule measures 5 mm on 87/508 versus 4 mm on the prior when remeasured in a similar fashion. Right upper lobe subpleural 4 mm nodule on 63/508 is only faintly visible at 2-3 mm previously. Musculoskeletal: Mild bilateral gynecomastia. Severe T12 compression deformity with minimal ventral canal encroachment is not significantly changed. CT ABDOMEN PELVIS FINDINGS Hepatobiliary: Normal liver. Normal gallbladder, without biliary ductal dilatation. Pancreas: Fatty replaced pancreas. Probable surgical absence of the upstream pancreatic body and tail. Spleen: Normal in size, without focal abnormality. Adrenals/Urinary Tract: Normal right adrenal gland. Left adrenal likely remains on 57/504, without dominant mass. Too small to characterize right renal lesions do not warrant specific imaging follow-up. No hydronephrosis. Left nephrectomy. Similar soft tissue density surrounding surgical sutures. Example at 1.8 x 1.3 cm on 59/504. Normal urinary  bladder. Stomach/Bowel: Normal remainder of the stomach. Extensive colonic diverticulosis. Normal terminal ileum and appendix. Normal small bowel. Vascular/Lymphatic: Aortic atherosclerosis. Infrarenal abdominal aortic dilatation at maximally 3.2 cm is not significantly changed. No abdominopelvic adenopathy. Reproductive: Normal prostate. Other: No significant free fluid.  No free intraperitoneal air. Musculoskeletal: Osteopenia. Severe L1 compression deformity with minimal ventral canal encroachment is unchanged. IMPRESSION: 1. 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 Although the majority of nodules are similar, some have minimally enlarged since 06/29/2022. 2. Left nephrectomy with similar nonspecific nodularity about the operative bed. 3. Aortic atherosclerosis (ICD10-I70.0), coronary artery atherosclerosis and emphysema (ICD10-J43.9). 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 4. 3.2 cm abdominal aortic dilatation. Recommend follow-up ultrasound every 2 years. This recommendation follows ACR consensus guidelines: White Paper of the ACR Incidental Findings Committee II on Vascular Findings. J Am Coll Radiol 2013; 10:789-794. Electronically Signed   By: Jeronimo Greaves M.D.   On: 09/30/2022 14:02   CT Abdomen Pelvis W Contrast  Result Date: 09/30/2022 CLINICAL DATA:  Left renal cell carcinoma. Staging. History of nephrectomy and immunotherapy. * Tracking Code: BO * EXAM: CT CHEST, ABDOMEN, AND PELVIS WITH CONTRAST TECHNIQUE: Multidetector CT imaging of the chest, abdomen and pelvis was performed following the standard protocol during bolus administration of intravenous contrast. RADIATION DOSE REDUCTION: This exam was performed according to the departmental dose-optimization program which includes automated exposure control, adjustment of the mA and/or kV according to patient size and/or use of iterative reconstruction technique. CONTRAST:   OMNIPAQUE IOHEXOL 300 MG/ML  SOLN COMPARISON:  06/29/2022 FINDINGS: CT CHEST FINDINGS Cardiovascular: Aortic atherosclerosis. Tortuous thoracic aorta. Moderate cardiomegaly, without pericardial effusion. Pacer. Multivessel coronary artery atherosclerosis. No central pulmonary embolism, on this non-dedicated study. Mediastinum/Nodes: No supraclavicular adenopathy. No mediastinal or hilar adenopathy. Small hiatal hernia. Lungs/Pleura: No pleural fluid. Moderate centrilobular and paraseptal emphysema. 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 Lower lung predominant bilateral pulmonary nodules are primarily similar. Some nodules are slightly larger. For example, a right middle lobe 11 mm nodule on 100/508 measures 10 mm on the prior when remeasured in a similar fashion. A subpleural lingular nodule measures 5 mm on 87/508 versus 4 mm on the prior when remeasured in a similar fashion. Right upper lobe subpleural 4 mm nodule on 63/508 is only faintly visible at 2-3 mm previously. Musculoskeletal: Mild bilateral gynecomastia. Severe T12 compression deformity with minimal ventral canal encroachment is not significantly changed. CT ABDOMEN PELVIS FINDINGS Hepatobiliary: Normal liver. Normal gallbladder, without biliary ductal dilatation. Pancreas: Fatty replaced pancreas. Probable surgical absence of the upstream pancreatic body and tail. Spleen: Normal in size,  without focal abnormality. Adrenals/Urinary Tract: Normal right adrenal gland. Left adrenal likely remains on 57/504, without dominant mass. Too small to characterize right renal lesions do not warrant specific imaging follow-up. No hydronephrosis. Left nephrectomy. Similar soft tissue density surrounding surgical sutures. Example at 1.8 x 1.3 cm on 59/504. Normal urinary bladder. Stomach/Bowel: Normal remainder of the stomach. Extensive colonic  diverticulosis. Normal terminal ileum and appendix. Normal small bowel. Vascular/Lymphatic: Aortic atherosclerosis. Infrarenal abdominal aortic dilatation at maximally 3.2 cm is not significantly changed. No abdominopelvic adenopathy. Reproductive: Normal prostate. Other: No significant free fluid.  No free intraperitoneal air. Musculoskeletal: Osteopenia. Severe L1 compression deformity with minimal ventral canal encroachment is unchanged. IMPRESSION: 1. 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 Although the majority of nodules are similar, some have minimally enlarged since 06/29/2022. 2. Left nephrectomy with similar nonspecific nodularity about the operative bed. 3. Aortic atherosclerosis (ICD10-I70.0), coronary artery atherosclerosis and emphysema (ICD10-J43.9). 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 4. 3.2 cm abdominal aortic dilatation. Recommend follow-up ultrasound every 2 years. This recommendation follows ACR consensus guidelines: White Paper of the ACR Incidental Findings Committee II on Vascular Findings. J Am Coll Radiol 2013; 10:789-794. Electronically Signed   By: Jeronimo Greaves M.D.   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  He initially presented with localized disease in 2020.  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  He presented with a platelet count of less than 5 and subdural hematoma.  He had a complete response to IVIG and dexamethasone.  He is status post left radical nephrectomy completed by Dr. Alvester Morin on October 18, 2018.  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  The patient was then treated with Ipilimumab 1 mg/kg and nivolumab 3 mg/kg started on October 01, 2021.  He completed 2 cycles of therapy and currently on hold  due to autoimmune complications predominantly mucositis and pharyngitis. The patient is currently on observation and he is feeling fine. He had repeat CT scan of the chest, abdomen and pelvis performed recently.  I personally and independently reviewed the scan and discussed the result with the patient and his wife. His scan showed very mild progression of some of the pulmonary metastasis. I had a lengthy discussion with the patient and his wife about his condition and treatment options.  He had a lot of complication from the previous treatment and he is not interested in starting any treatment soon. I recommended for him to continue on observation with repeat imaging studies in 3 months. For the dry mouth, he was giving Magic mouthwash. The patient was advised to call immediately if he has any other concerning symptoms in the interval. The patient voices understanding of current disease status and treatment options and is in agreement with the current care plan.  All questions were answered. The patient knows to call the clinic with any problems, questions or concerns. We can certainly see the patient much sooner if necessary.  The total time spent in the appointment was 30 minutes.  Disclaimer: This note was dictated with voice recognition software. Similar sounding words can inadvertently be transcribed and may not be corrected upon review.

## 2022-10-07 ENCOUNTER — Encounter: Payer: Self-pay | Admitting: Cardiovascular Disease

## 2022-10-07 NOTE — Progress Notes (Signed)
[name] Date of Birth  [birth year]       [address]    [address]    [place], [place]  [address]         Problem list: 1. Paroxysmal atrial fibrillation 2. History of syncope while driving 3.  Pacemaker 4, Adrenal cancer 5. Subdural hematoma       Frank [name] is a 82 yo with hx of PAF who was admitted to [facility] after having a syncopal episode while driving.  He had some warning and was able to pull the car over.   He had a 30 day monitor which did not reveal any significant arrhythmias.  He has been walking 30 minutes a day.  He has been eating "[employer] Cuisine" dinners.    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  He has not had any symptoms of syncope.  He was started on diltiazem and has been doing well.  He has not had any head aches and is feeling well.    Oct. 14, 2014:  Frank [name] is doing well.  He is staying busy.  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  He get some exercise but needs to get more.  He has had problems with left thigh numbness.  The numbness has  Improved slightly .  He has lost 10 lbs over the past month.    Jul 26, 2013:  Frank [name] is doing well.  He has continued to lose some weight.  He has lost 20 lbs since his last office visit Oct. 2014.    No CP or dyspnea or syncope / presyncope.  Jan. 7, 2016:  Frank [name] is seen today for follow up hx of paroxysmal atrial fib. 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 19, 2017: Frank [name] is doing well.   Had an episode of "not feeling well"  Called EMS,   Monitor possibly showed a brief run of atrial fib.   Resolved while EMS was there . He thinks that it may related to eating too many sweets.  Keeping busy.    Jan. 22, 2018: Frank [name] is seen today for follow-up visit. He has a history of paroxysmal atrial fibrillation. No CP or dyspnea.    Feb. 6,  2019:   Had some palpitations several days ago - happened after he ate some fudge.  Did not know if it was atrial fib or not.   Lasted several hours  Feels well  Was given Lipitor by his primary MD  - has not taken it Trigs were very elevated.   He has been taking fish oil which seems to be helping    April 10, 2018: Frank [name] is seen today for follow-up of his paroxysmal atrial fibrillation.  He also has hyperlipidemia. Has not had any issues over the past year  He was started on Atorvastatin .   But he never took it .  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    Feb. 9, 2021 Frank [name] is seen today for follow-up of his paroxysmal atrial fibrillation.  He has a history of hyperlipidemia. He had a hx of syncope.   Loop recorder revealed significant pauses. He had a pacer placed.  Had a back fracture.   Had  kidney and adrenal gland removed.   Was found to have adrenal cancer  Has not had any recurrence Has had continued back pain .   His back was not healing from his fracture.   Started taking Prolia  Which seems to be healing  He has noticed a Right breast mass.  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    Feb. 14, 2022: Frank [name] is seen today for follow up of his PAF, HLD ,  Seen with his wife, [name] Still has PAF  Had some indigestion, Found that he was in atrial fib ( pacer interrogation )  Has a fit bit.   HR is 105 on occasion.   Feb. 15, 2023 Frank [name] is seen today for his PAF, HLD [name] Their daughter passed away last year   Needs to follow up with EP He needs to have a brain MRI - will need his pacer checked to make sure it is MRI compatible .    Feb. 9 2024  Frank [name] is seen for follow up for his PAF, HLD  [name]   Developed a severe headache several months ago .  The day after he had a COVID booster .   Had extensive bruising in his arm and across his chest .   Started having nose bleeds.   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   Seems to be stable at this point .   He is off his eliquis due to his subdural hematoma.   Has developed generalized tremor .   He has an implantable loop recorder   His Afib history is as follows   December 25th:  23 seconds  (4:23pm)  December 13th:  2 episodes - total of approx 10 hours and 22 minutes ( both around 6:41am).  During hit acute hospitalization       Aug. 9 , 2024    Episodes of atrial fib  on his Implantable loop :  -  December 25th:  23 seconds  (4:23pm)  - December 13th:  2 episodes - total of approx 10 hours and 22 minutes ( both around 6:41am).    There have been no other episodes recorded before or after.   I originally plan on referring him for watchman procedure .  Dr.Cooper reviewed the chart and determinited that he was not a candidate for watchman  His episodes of PAF are very brief  - except for during his hospitalization for an acute illness.  I have spoken to Dr. 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 Presented with a platelet count of < 5K and a subdural hematoma Had a complete response to IVIG and dexamethasone   Frank [name] relates his thrombocytopenia to a COVID booster He had a bad reaction to the booster ( broke out in hives,  felt poorly )   Interrogation of his implantable loop revealed that he has not had any further episodes of atrial fibrillation since December.    Current Outpatient Medications on File Prior to Visit  Medication Sig Dispense Refill   acetaminophen (TYLENOL) 500 MG tablet Take 1,000 mg by mouth every 6 (six) hours as needed for mild  pain.     Calcium Carbonate (CALCIUM 500 PO) Take 500 mg by mouth 2 (two) times daily.     chlorhexidine (PERIDEX) 0.12 % solution Use as directed 15 mLs in the mouth or throat 2 (two) times daily.     denosumab (PROLIA) 60 MG/ML SOSY injection Inject 60 mg into the skin every 6 (six) months.     esomeprazole (NEXIUM) 40 MG capsule Take 1 capsule (40 mg total) by mouth daily before breakfast. 90 capsule 3   magic mouthwash (nystatin, diphenhydrAMINE, alum & mag hydroxide) suspension mixture Swish and swallow 5 mLs 3 (three) times daily as needed for mouth pain. 240 mL 2   Multiple Vitamins-Minerals (CENTRUM MULTI + OMEGA 3 PO) Take 1 tablet by mouth daily.      omega-3 acid ethyl esters (LOVAZA) 1 g capsule Take 2 capsules by mouth twice daily 360 capsule 0   rosuvastatin (CRESTOR) 10 MG tablet Take 1 tablet by mouth once daily 90 tablet 0   solifenacin (VESICARE) 10 MG tablet Take 1 tablet (10 mg total) by mouth at bedtime. 90 tablet 1   VITAMIN D PO Take 1,000 Units by mouth daily.     No current facility-administered medications on file prior to visit.    Allergies  Allergen Reactions   Hydrocodone Other (See Comments)    Caused confusion    Monosodium Glutamate Other (See Comments)    Caused headaches   Naproxen Sodium     Mouth blisters with prolonged use.  Left nephrectomy 2020   Other     Perfume - headaches     Past Medical History:  Diagnosis Date   Abdominal aortic aneurysm (AAA) without rupture 06/23/2018   Age-related osteoporosis without current pathological fracture 07/14/2021   BPH (benign prostatic hyperplasia) 01/19/2010   Cervical radiculopathy 06/16/2018   Chronic anticoagulation 08/22/2018   Chronic bilateral low back pain without sciatica 07/18/2019   Chronic sinusitis 05/18/2021   Diverticulosis    Erectile dysfunction due to arterial insufficiency 04/23/2015   Esophageal stricture    Essential hypertension 05/01/2018   Estimated Creatinine Clearance:  44.1 mL/min (by C-G formula based on SCr of 1.35 mg/dL).   GERD (gastroesophageal reflux disease) 11/10/2010   Hearing loss    Hiatal hernia    Hyperlipidemia with target LDL less than 70 11/28/2012   The 10-year ASCVD risk score Denman George DC Jr., et al., 2013) is: 30.7%   Values used to calculate the score:     Age: 82 years     Sex: Male     Is Non-Hispanic African American: No     Diabetic: No     Tobacco smoker: No     Systolic Blood Pressure: 132 mmHg     Is BP treated: No     HDL Cholesterol: 30.8 mg/dL     Total Cholesterol: 183 mg/dL   Hypoglycemia    Iron deficiency anemia due to dietary causes 12/07/2018   Lung nodule seen on imaging study 06/23/2018   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 08/22/2018    a Medtronic Azure XT MRI conditional dual-chamber pacemaker for symptomatic sinus pauses, sick sinus syndrome, and syncope   Migraine headache    OAB (overactive bladder) 01/06/2016   PAF (paroxysmal atrial fibrillation)    Echo with normal LV function and mild LVH  Prediabetes 12/06/2018   Pure hyperglyceridemia 11/22/2007   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   Estimated Creatinine Clearance: 51.7 mL/min (by C-G formula based on SCr of 1.15 mg/dL).   Syncope    presented with atrial fib converted with Diltiazem   Systemic inflammatory response syndrome 06/26/2018   Thrombocytopenia 01/19/2021    Past Surgical History:  Procedure Laterality Date   CYSTOSCOPY WITH URETEROSCOPY AND STENT PLACEMENT Left 08/16/2018   Procedure: CYSTOSCOPY WITH LEFT RETROGRADE PYELOGRAM, BALLOON DILATION LEFT URETER, STENT PLACEMENT;  Surgeon: Crista Elliot, MD;  Location: [facility];  Service: Urology;  Laterality: Left;   HERNIA REPAIR      INSERT / REPLACE / REMOVE PACEMAKER     LAPAROSCOPIC ASSISTED VENTRAL HERNIA REPAIR N/A 10/18/2018   Procedure: LAPAROSCOPIC ASSISTED VENTRAL WALL HERNIA REPAIR WITH MESH;  Surgeon: Karie Soda, MD;  Location: [facility];  Service: General;  Laterality: N/A;   LAPAROSCOPIC NEPHRECTOMY, HAND ASSISTED Left 10/18/2018   Procedure: LEFT HAND ASSISTED LAPAROSCOPIC RADICAL NEPHRECTOMY WITH ADRENALECTOMY;  Surgeon: Crista Elliot, MD;  Location: [facility];  Service: Urology;  Laterality: Left;   LOOP RECORDER INSERTION N/A 04/26/2018   Procedure: LOOP RECORDER INSERTION;  Surgeon: Hillis Range, MD;  Location: [facility];  Service: Cardiovascular;  Laterality: N/A;   LOOP RECORDER REMOVAL N/A 07/18/2018   Procedure: LOOP RECORDER REMOVAL;  Surgeon: Hillis Range, MD;  Location: [facility];  Service: Cardiovascular;  Laterality: N/A;   NASAL SEPTUM SURGERY     PACEMAKER IMPLANT N/A 07/18/2018   Procedure: PACEMAKER IMPLANT;  Surgeon: Hillis Range, MD;  Location: [facility];  Service: Cardiovascular;  Laterality: N/A;   TONSILLECTOMY     TONSILLECTOMY     UMBILICAL HERNIA REPAIR  1993   WISDOM TOOTH EXTRACTION     about 82 years old    Social History   Tobacco Use  Smoking Status Former   Current packs/day: 0.00   Types: Cigarettes   Quit date: 03/02/1987   Years since quitting: 35.6  Smokeless Tobacco Never    Social History   Substance and Sexual Activity  Alcohol Use No   Alcohol/week: 0.0 standard drinks of alcohol    Family History  Problem Relation Age of Onset   Emphysema Father    Memory loss Sister    Gallbladder disease Sister    Bipolar disorder Sister    Bipolar disorder Paternal Grandmother    Dementia Paternal Grandmother    Memory loss Paternal Grandmother    Colon cancer Neg Hx     Reviw of Systems:  Reviewed in current history, otherwise systems are negative.   Physical Exam: Blood pressure 118/64, pulse (!) 57, height 5\' 5"  (1.651 m),  weight 179 lb (81.2 kg), SpO2 97%.       GEN:  elderly male ,  in no acute distress HEENT: Normal NECK: No JVD; No carotid bruits LYMPHATICS: No lymphadenopathy CARDIAC: RRR , no murmurs, rubs, gallops RESPIRATORY:  Clear to auscultation without rales, wheezing or rhonchi  ABDOMEN: Soft, non-tender, non-distended MUSCULOSKELETAL:  No edema; No deformity  SKIN: Warm and dry NEUROLOGIC:  Alert and oriented x 3    ECG:     Assessment / Plan:   1. Paroxysmal atrial fibrillation-  Feb. 14, 2013 ( documented by ECG)  - CHADS2VASC = 2 ( age 40)  Frank [name] has a history of paroxysmal atrial fibrillation.  He had several episodes in December but has not had any since that time.  He was on Eliquis but this was discontinued when he developed thrombocytopenia and developed a subdural hematoma.  From history it sounds like he had a COVID booster shortly before his episode of thrombocytopenia and subdural hematoma. I am wondering if we can attribute this episode of thrombocytopenia to his COVID booster then it might be possible to restart his Eliquis since his thrombocytopenia seems to be caused by an external insult.  He has no plans on getting any further COVID shots. I would like to get an opinion from neurosurgery and Dr. Shirline Frees of oncology.  Will continue to monitor using the implantable loop.  2. History of syncope while driving -      Kristeen Miss, MD  10/08/2022 9:01 AM    [facility] [address] [place],  [address] [place], [place]  [address] Pager [phone] Phone: [phone]; Fax: [phone]

## 2022-10-08 ENCOUNTER — Encounter: Payer: Self-pay | Admitting: Cardiovascular Disease

## 2022-10-08 ENCOUNTER — Ambulatory Visit: Payer: Medicare Other | Admitting: Cardiovascular Disease

## 2022-10-08 VITALS — BP 118/64 | HR 57 | Ht 65.0 in | Wt 179.0 lb

## 2022-10-08 DIAGNOSIS — I480 Paroxysmal atrial fibrillation: Secondary | ICD-10-CM | POA: Diagnosis not present

## 2022-10-08 NOTE — Patient Instructions (Signed)

## 2022-10-11 ENCOUNTER — Telehealth: Payer: Self-pay | Admitting: Cardiology

## 2022-10-11 LAB — CUP PACEART REMOTE DEVICE CHECK
Battery Remaining Longevity: 122 mo
Battery Voltage: 3 V
Brady Statistic AP VP Percent: 3.18 %
Brady Statistic AP VS Percent: 45.73 %
Brady Statistic AS VP Percent: 6.48 %
Brady Statistic AS VS Percent: 44.61 %
Brady Statistic RA Percent Paced: 55.77 %
Brady Statistic RV Percent Paced: 9.67 %
Date Time Interrogation Session: 20240812102845
Implantable Lead Connection Status: 753985
Implantable Lead Connection Status: 753985
Implantable Lead Implant Date: 20200519
Implantable Lead Implant Date: 20200519
Implantable Lead Location: 753859
Implantable Lead Location: 753860
Implantable Lead Model: 5076
Implantable Lead Model: 5076
Implantable Pulse Generator Implant Date: 20200519
Lead Channel Impedance Value: 304 Ohm
Lead Channel Impedance Value: 380 Ohm
Lead Channel Impedance Value: 475 Ohm
Lead Channel Impedance Value: 513 Ohm
Lead Channel Pacing Threshold Amplitude: 0.5 V
Lead Channel Pacing Threshold Amplitude: 0.5 V
Lead Channel Pacing Threshold Pulse Width: 0.4 ms
Lead Channel Pacing Threshold Pulse Width: 0.4 ms
Lead Channel Sensing Intrinsic Amplitude: 4.25 mV
Lead Channel Sensing Intrinsic Amplitude: 4.25 mV
Lead Channel Sensing Intrinsic Amplitude: 5.5 mV
Lead Channel Sensing Intrinsic Amplitude: 5.5 mV
Lead Channel Setting Pacing Amplitude: 1.5 V
Lead Channel Setting Pacing Amplitude: 2.5 V
Lead Channel Setting Pacing Pulse Width: 0.4 ms
Lead Channel Setting Sensing Sensitivity: 2 mV
Zone Setting Status: 755011
Zone Setting Status: 755011

## 2022-10-11 NOTE — Telephone Encounter (Signed)
Patient c/o Palpitations:  STAT if patient reporting lightheadedness, shortness of breath, or chest pain  How long have you had palpitations/irregular HR/ Afib? Are you having the symptoms now? Afib that started yesterday   Are you currently experiencing lightheadedness, SOB or CP? No   Do you have a history of afib (atrial fibrillation) or irregular heart rhythm? Yes   Have you checked your BP or HR? (document readings if available): he states hr is in the 80's   Are you experiencing any other symptoms? Pt states he felt like he was in afib yesterday. He denies any other symptoms. He states he sent a transmission yesterday and wants to know if anything looked abnormal on it.

## 2022-10-11 NOTE — Telephone Encounter (Signed)
Pt reports of intermittent fatigue that started yesterday am after eating sweet foods. Denies chest pain, shortness of breath of other symptoms. Patient reports feeling carotid pulse and felt slower to him, concerned he was in AF. AF was noted 10/09/22 w/ duration 15h . Manual transmission reviewed today and presenting appears trigeminy. Pt advised to call if any symptoms change.   Routing to Dr. Lalla Brothers for further recommendation.

## 2022-10-13 ENCOUNTER — Ambulatory Visit (INDEPENDENT_AMBULATORY_CARE_PROVIDER_SITE_OTHER): Payer: Medicare Other

## 2022-10-13 DIAGNOSIS — I495 Sick sinus syndrome: Secondary | ICD-10-CM | POA: Diagnosis not present

## 2022-10-22 ENCOUNTER — Other Ambulatory Visit: Payer: Self-pay

## 2022-10-22 MED ORDER — ESOMEPRAZOLE MAGNESIUM 40 MG PO CPDR: 40.0000 mg | DELAYED_RELEASE_CAPSULE | Freq: Every day | ORAL | 1 refills | Status: DC

## 2022-10-23 ENCOUNTER — Other Ambulatory Visit: Payer: Self-pay | Admitting: Internal Medicine

## 2022-10-23 DIAGNOSIS — E785 Hyperlipidemia, unspecified: Secondary | ICD-10-CM

## 2022-10-27 NOTE — Progress Notes (Signed)
Remote pacemaker transmission.   

## 2022-10-28 ENCOUNTER — Other Ambulatory Visit: Payer: Self-pay

## 2022-10-28 ENCOUNTER — Other Ambulatory Visit (HOSPITAL_COMMUNITY): Payer: Medicare Other

## 2022-10-28 ENCOUNTER — Other Ambulatory Visit: Payer: Medicare Other

## 2022-10-28 MED ORDER — ESOMEPRAZOLE MAGNESIUM 40 MG PO CPDR: 40.0000 mg | DELAYED_RELEASE_CAPSULE | Freq: Every day | ORAL | 1 refills | Status: DC

## 2022-11-02 ENCOUNTER — Ambulatory Visit: Payer: Medicare Other | Admitting: Internal Medicine

## 2022-11-04 NOTE — Telephone Encounter (Unsigned)
Spoke to patients wife who advises patient is currently at the grocery store.

## 2022-11-04 NOTE — Telephone Encounter (Signed)
Pt called back and I let him speak with Leigh, rn.

## 2022-11-04 NOTE — Telephone Encounter (Signed)
Reviewed with Dr. Lalla Brothers. Agrees trigeminy and patient needs in-clinic f/u. Patient called to advise and routing to scheduling for apt.

## 2022-11-12 ENCOUNTER — Telehealth: Payer: Self-pay | Admitting: Cardiovascular Disease

## 2022-11-12 NOTE — Telephone Encounter (Signed)
Patient has apt 11/15/22.

## 2022-11-12 NOTE — Telephone Encounter (Signed)
Message sent to EP scheduling for update.

## 2022-11-12 NOTE — Telephone Encounter (Signed)
  Per MyChart scheduling message:  Initial complaint: Irregular heartbeat, fatigue, if blood thinner should be resumed.   Patient c/o Palpitations:  STAT if patient reporting lightheadedness, shortness of breath, or chest pain  How long have you had palpitations/irregular HR/ Afib? Are you having the symptoms now?   Are you currently experiencing lightheadedness, SOB or CP?   Do you have a history of afib (atrial fibrillation) or irregular heart rhythm?   Have you checked your BP or HR? (document readings if available):   Are you experiencing any other symptoms?     All this info is in his history., he has sent several signals through the monitor. Has talked to several people and we have not had any resolution. Also, we were waiting to find out if [name] should be back on a blood thinner. Have not heard anything,  has been several weeks since the last appointment and we were told they were checking on it.

## 2022-11-12 NOTE — Telephone Encounter (Signed)
Patient reached out to device clinic 10/11/22 and per note on 11/04/22-pt was having trigeminy and needed in-clinic visit per Lalla Brothers. Appt scheduled for 01/10/23. Last OV note by Nahser on 10/08/22: At this point, I do not think that risk risks of repeat cerebral hemorrhage is worth starting Eliquis  Will route to [place] to see if he has a different opinion on Eliquis. Routing to device team as well to see if any new issues have showed on his device since 11/04/22.

## 2022-11-15 ENCOUNTER — Encounter: Payer: Self-pay | Admitting: Student

## 2022-11-15 ENCOUNTER — Ambulatory Visit: Payer: Medicare Other | Attending: Cardiology | Admitting: Student

## 2022-11-15 VITALS — BP 110/62 | HR 50 | Ht 65.0 in | Wt 184.8 lb

## 2022-11-15 DIAGNOSIS — I10 Essential (primary) hypertension: Secondary | ICD-10-CM | POA: Diagnosis not present

## 2022-11-15 DIAGNOSIS — I493 Ventricular premature depolarization: Secondary | ICD-10-CM | POA: Diagnosis not present

## 2022-11-15 DIAGNOSIS — I495 Sick sinus syndrome: Secondary | ICD-10-CM

## 2022-11-15 DIAGNOSIS — I480 Paroxysmal atrial fibrillation: Secondary | ICD-10-CM | POA: Diagnosis not present

## 2022-11-15 LAB — CUP PACEART INCLINIC DEVICE CHECK
Battery Remaining Longevity: 122 mo
Battery Voltage: 3 V
Brady Statistic AP VP Percent: 1.71 %
Brady Statistic AP VS Percent: 52.02 %
Brady Statistic AS VP Percent: 2.8 %
Brady Statistic AS VS Percent: 43.48 %
Brady Statistic RA Percent Paced: 57.89 %
Brady Statistic RV Percent Paced: 4.82 %
Date Time Interrogation Session: 20240916132830
Implantable Lead Connection Status: 753985
Implantable Lead Connection Status: 753985
Implantable Lead Implant Date: 20200519
Implantable Lead Implant Date: 20200519
Implantable Lead Location: 753859
Implantable Lead Location: 753860
Implantable Lead Model: 5076
Implantable Lead Model: 5076
Implantable Pulse Generator Implant Date: 20200519
Lead Channel Impedance Value: 342 Ohm
Lead Channel Impedance Value: 418 Ohm
Lead Channel Impedance Value: 475 Ohm
Lead Channel Impedance Value: 532 Ohm
Lead Channel Pacing Threshold Amplitude: 0.5 V
Lead Channel Pacing Threshold Amplitude: 0.5 V
Lead Channel Pacing Threshold Pulse Width: 0.4 ms
Lead Channel Pacing Threshold Pulse Width: 0.4 ms
Lead Channel Sensing Intrinsic Amplitude: 2.875 mV
Lead Channel Sensing Intrinsic Amplitude: 4.75 mV
Lead Channel Sensing Intrinsic Amplitude: 5.125 mV
Lead Channel Sensing Intrinsic Amplitude: 6.5 mV
Lead Channel Setting Pacing Amplitude: 1.5 V
Lead Channel Setting Pacing Amplitude: 2.5 V
Lead Channel Setting Pacing Pulse Width: 0.4 ms
Lead Channel Setting Sensing Sensitivity: 2 mV
Zone Setting Status: 755011
Zone Setting Status: 755011

## 2022-11-15 MED ORDER — METOPROLOL SUCCINATE ER 25 MG PO TB24
25.0000 mg | ORAL_TABLET | Freq: Every day | ORAL | 3 refills | Status: DC
Start: 2022-11-15 — End: 2023-04-04

## 2022-11-15 NOTE — Patient Instructions (Signed)
Medication Instructions:  Start metoprolol succinate (Toprol XL) 25 mg daily at bedtime. *If you need a refill on your cardiac medications before your next appointment, please call your pharmacy*  Lab Work: None ordered If you have labs (blood work) drawn today and your tests are completely normal, you will receive your results only by: MyChart Message (if you have MyChart) OR A paper copy in the mail If you have any lab test that is abnormal or we need to change your treatment, we will call you to review the results.  Follow-Up: At [facility], you and your health needs are our priority.  As part of our continuing mission to provide you with exceptional heart care, we have created designated Provider Care Teams.  These Care Teams include your primary Cardiologist (physician) and Advanced Practice Providers (APPs -  Physician Assistants and Nurse Practitioners) who all work together to provide you with the care you need, when you need it.  Your next appointment:   6-8 week(s)  Provider:   Casimiro Needle "Otilio Saber, PA-C

## 2022-11-15 NOTE — Progress Notes (Unsigned)
Electrophysiology Office Note:   ID:  Frank Ivey., DOB 31-Dec-1940, MRN [id number]  Primary Cardiologist: Kristeen Miss, MD Electrophysiologist: Lanier Prude, MD  {Click to update primary MD,subspecialty MD or APP then REFRESH:1}    History of Present Illness:   Frank Sheth. is a 82 y.o. male with h/o h/o SSS s/p Medtronic PPM, PAF, chronic anticoagulation on Eliquis, and HLD seen today for acute visit due to palpitations.    Patient reports irregular .  he denies chest pain, palpitations, dyspnea, PND, orthopnea, nausea, vomiting, dizziness, syncope, edema, weight gain, or early satiety.   Review of systems complete and found to be negative unless listed in HPI.   EP Information / Studies Reviewed:    {EKGtoday:28818}       PPM Interrogation-  reviewed in detail today,  See PACEART report.  Device History: {INDUSTRY:28136} {Blank single:19197::"Dual Chamber","Single Chamber","BiV","Leadless"} PPM implanted *** for {Blank single:19197::"Sinus Node Dysfunction","CHB","Second Degree AV block","Tachy-Brady syndrome","Uncontrolled atrial arrhyhtmia s/p AV node ablation"}  Physical Exam:   VS:  BP 110/62   Pulse (!) 50   Ht 5\' 5"  (1.651 m)   Wt 184 lb 12.8 oz (83.8 kg)   SpO2 98%   BMI 30.75 kg/m    Wt Readings from Last 3 Encounters:  11/15/22 184 lb 12.8 oz (83.8 kg)  10/08/22 179 lb (81.2 kg)  10/04/22 179 lb 6.4 oz (81.4 kg)     GEN: Well nourished, well developed in no acute distress NECK: No JVD; No carotid bruits CARDIAC: {EPRHYTHM:28826}, no murmurs, rubs, gallops RESPIRATORY:  Clear to auscultation without rales, wheezing or rhonchi  ABDOMEN: Soft, non-tender, non-distended EXTREMITIES:  No edema; No deformity   ASSESSMENT AND PLAN:    {Blank single:19197::"SND","CHB","Second Degree AV block","Tachy-Brady syndrome","Sick sinus syndrome","Symptomatic bradycardia","Uncontrolled atrial arrhyhtmia s/p AV node ablation"} s/p {INDUSTRY:28136} PPM   Normal PPM function See Pace Art report No changes today  {Click here to Review PMH, Prob List, Meds, Allergies, SHx, FHx  :1}   Disposition:   Follow up with {EPPROVIDERS:28135} {EPFOLLOW UP:28173}  Signed, Graciella Freer, PA-C

## 2022-11-16 ENCOUNTER — Encounter: Payer: Self-pay | Admitting: Student

## 2022-11-23 ENCOUNTER — Other Ambulatory Visit: Payer: Self-pay | Admitting: Internal Medicine

## 2022-11-23 DIAGNOSIS — E781 Pure hyperglyceridemia: Secondary | ICD-10-CM

## 2022-12-28 ENCOUNTER — Ambulatory Visit (HOSPITAL_COMMUNITY)
Admission: RE | Admit: 2022-12-28 | Discharge: 2022-12-28 | Disposition: A | Discharge status: Home / Self Care | Payer: Medicare Other | Source: Ambulatory Visit | Attending: Internal Medicine | Admitting: Internal Medicine

## 2022-12-28 ENCOUNTER — Inpatient Hospital Stay: Payer: Medicare Other | Attending: Internal Medicine

## 2022-12-28 ENCOUNTER — Encounter: Payer: Medicare Other | Admitting: Student

## 2022-12-28 DIAGNOSIS — C642 Malignant neoplasm of left kidney, except renal pelvis: Secondary | ICD-10-CM | POA: Insufficient documentation

## 2022-12-28 DIAGNOSIS — C3490 Malignant neoplasm of unspecified part of unspecified bronchus or lung: Secondary | ICD-10-CM | POA: Insufficient documentation

## 2022-12-28 DIAGNOSIS — C7800 Secondary malignant neoplasm of unspecified lung: Secondary | ICD-10-CM | POA: Diagnosis not present

## 2022-12-28 DIAGNOSIS — K449 Diaphragmatic hernia without obstruction or gangrene: Secondary | ICD-10-CM | POA: Diagnosis not present

## 2022-12-28 DIAGNOSIS — C7970 Secondary malignant neoplasm of unspecified adrenal gland: Secondary | ICD-10-CM | POA: Diagnosis not present

## 2022-12-28 DIAGNOSIS — J479 Bronchiectasis, uncomplicated: Secondary | ICD-10-CM | POA: Diagnosis not present

## 2022-12-28 DIAGNOSIS — D693 Immune thrombocytopenic purpura: Secondary | ICD-10-CM | POA: Insufficient documentation

## 2022-12-28 LAB — LACTATE DEHYDROGENASE: LDH: 154 U/L (ref 98–192)

## 2022-12-28 LAB — CBC WITH DIFFERENTIAL (CANCER CENTER ONLY)
Abs Immature Granulocytes: 0.04 10*3/uL (ref 0.00–0.07)
Basophils Absolute: 0 10*3/uL (ref 0.0–0.1)
Basophils Relative: 0 %
Eosinophils Absolute: 0.1 10*3/uL (ref 0.0–0.5)
Eosinophils Relative: 1 %
HCT: 41.8 % (ref 39.0–52.0)
Hemoglobin: 14.7 g/dL (ref 13.0–17.0)
Immature Granulocytes: 1 %
Lymphocytes Relative: 16 %
Lymphs Abs: 0.9 10*3/uL (ref 0.7–4.0)
MCH: 33.2 pg (ref 26.0–34.0)
MCHC: 35.2 g/dL (ref 30.0–36.0)
MCV: 94.4 fL (ref 80.0–100.0)
Monocytes Absolute: 0.4 10*3/uL (ref 0.1–1.0)
Monocytes Relative: 6 %
Neutro Abs: 4.4 10*3/uL (ref 1.7–7.7)
Neutrophils Relative %: 76 %
Platelet Count: 97 10*3/uL — ABNORMAL LOW (ref 150–400)
RBC: 4.43 MIL/uL (ref 4.22–5.81)
RDW: 13.2 % (ref 11.5–15.5)
WBC Count: 5.8 10*3/uL (ref 4.0–10.5)
nRBC: 0 % (ref 0.0–0.2)

## 2022-12-28 LAB — CMP (CANCER CENTER ONLY)
ALT: 22 U/L (ref 0–44)
AST: 22 U/L (ref 15–41)
Albumin: 4.4 g/dL (ref 3.5–5.0)
Alkaline Phosphatase: 36 U/L — ABNORMAL LOW (ref 38–126)
Anion gap: 5 (ref 5–15)
BUN: 15 mg/dL (ref 8–23)
CO2: 27 mmol/L (ref 22–32)
Calcium: 9.9 mg/dL (ref 8.9–10.3)
Chloride: 98 mmol/L (ref 98–111)
Creatinine: 1.02 mg/dL (ref 0.61–1.24)
GFR, Estimated: >60 mL/min (ref >60)
Glucose, Bld: 104 mg/dL — ABNORMAL HIGH (ref 70–99)
Potassium: 4.9 mmol/L (ref 3.5–5.1)
Sodium: 130 mmol/L — ABNORMAL LOW (ref 135–145)
Total Bilirubin: 0.7 mg/dL (ref 0.3–1.2)
Total Protein: 7.6 g/dL (ref 6.5–8.1)

## 2022-12-29 NOTE — Progress Notes (Unsigned)
Electrophysiology Office Note:   ID:  Rhonald Saelens., DOB 01-17-1941, MRN [id number]  Primary Cardiologist: Kristeen Miss, MD Electrophysiologist: Lanier Prude, MD  {Click to update primary MD,subspecialty MD or APP then REFRESH:1}    History of Present Illness:   Zeeshan Eyerman. is a 82 y.o. male with h/o SSS s/p Medtronic PPM, PAF, chronic anticoagulation on Eliquis, and HLD seen today for routine electrophysiology followup.   Seen 9/16 for palpitations, felt to more likely be related to PVCs. Toprol added.  Since last being seen in our clinic the patient reports doing ***.  he denies chest pain, palpitations, dyspnea, PND, orthopnea, nausea, vomiting, dizziness, syncope, edema, weight gain, or early satiety.   Review of systems complete and found to be negative unless listed in HPI.   EP Information / Studies Reviewed:    EKG is ordered today. Personal review as below.       PPM Interrogation-  reviewed in detail today,  See PACEART report.  Device History: 07/18/2018 MDT dual chamber PPM  (loop removed)  Physical Exam:   VS:  There were no vitals taken for this visit.   Wt Readings from Last 3 Encounters:  11/15/22 184 lb 12.8 oz (83.8 kg)  10/08/22 179 lb (81.2 kg)  10/04/22 179 lb 6.4 oz (81.4 kg)     GEN: Well nourished, well developed in no acute distress NECK: No JVD; No carotid bruits CARDIAC: {EPRHYTHM:28826}, no murmurs, rubs, gallops RESPIRATORY:  Clear to auscultation without rales, wheezing or rhonchi  ABDOMEN: Soft, non-tender, non-distended EXTREMITIES:  No edema; No deformity   ASSESSMENT AND PLAN:    SND s/p Medtronic PPM  Normal PPM function See Pace Art report No changes today  PVCs *** per hour since adding toprol Continue toprol 25 mg daily Can consider mexitil > amiodarone if not sufficiently suppressed on BB alone.  Consider Zio if needed to help quantify.  Higher LRLs did not seem to significant change PVC burden.    PAF Burden chronically low Labs today CHA2DS2/VASc is 3 Eliquis previously "permanently" stopped in the setting of subdural hematoma He is anxious about the possibility of restarting. He verbalizes understanding that permission to pursue this would need to come explicitly from Neurosurgery / Heme/Onc.   H/o subdural hematoma Pt states was spontaneous after COVID shot as well as a (chronic) dose of prolia.  He states he has been told by neurology he cannot every take any blood thinners, and also states he has been told he would not be a Watchman candidate, as they would not even want him on them for a short time.  As above, any desire to resume would need to come with permission and close follow up from them.  {Click here to Review PMH, Prob List, Meds, Allergies, SHx, FHx  :1}   Disposition:   Follow up with {EPPROVIDERS:28135} {EPFOLLOW UP:28173}  Signed, Graciella Freer, PA-C

## 2022-12-30 ENCOUNTER — Ambulatory Visit: Payer: Medicare Other | Attending: Student | Admitting: Student

## 2022-12-30 ENCOUNTER — Encounter: Payer: Self-pay | Admitting: Student

## 2022-12-30 VITALS — BP 118/74 | HR 60 | Ht 65.0 in | Wt 183.0 lb

## 2022-12-30 DIAGNOSIS — I10 Essential (primary) hypertension: Secondary | ICD-10-CM

## 2022-12-30 DIAGNOSIS — I495 Sick sinus syndrome: Secondary | ICD-10-CM

## 2022-12-30 DIAGNOSIS — I480 Paroxysmal atrial fibrillation: Secondary | ICD-10-CM

## 2022-12-30 DIAGNOSIS — S065XAA Traumatic subdural hemorrhage with loss of consciousness status unknown, initial encounter: Secondary | ICD-10-CM | POA: Diagnosis not present

## 2022-12-30 DIAGNOSIS — I493 Ventricular premature depolarization: Secondary | ICD-10-CM | POA: Diagnosis not present

## 2022-12-30 LAB — CUP PACEART INCLINIC DEVICE CHECK
Battery Remaining Longevity: 120 mo
Battery Voltage: 3 V
Brady Statistic AP VP Percent: 3.21 %
Brady Statistic AP VS Percent: 59.48 %
Brady Statistic AS VP Percent: 2.04 %
Brady Statistic AS VS Percent: 35.27 %
Brady Statistic RA Percent Paced: 65.78 %
Brady Statistic RV Percent Paced: 5.78 %
Date Time Interrogation Session: 20241031083828
Implantable Lead Connection Status: 753985
Implantable Lead Connection Status: 753985
Implantable Lead Implant Date: 20200519
Implantable Lead Implant Date: 20200519
Implantable Lead Location: 753859
Implantable Lead Location: 753860
Implantable Lead Model: 5076
Implantable Lead Model: 5076
Implantable Pulse Generator Implant Date: 20200519
Lead Channel Impedance Value: 323 Ohm
Lead Channel Impedance Value: 399 Ohm
Lead Channel Impedance Value: 513 Ohm
Lead Channel Impedance Value: 532 Ohm
Lead Channel Pacing Threshold Amplitude: 0.5 V
Lead Channel Pacing Threshold Amplitude: 0.625 V
Lead Channel Pacing Threshold Pulse Width: 0.4 ms
Lead Channel Pacing Threshold Pulse Width: 0.4 ms
Lead Channel Sensing Intrinsic Amplitude: 4.625 mV
Lead Channel Sensing Intrinsic Amplitude: 4.625 mV
Lead Channel Sensing Intrinsic Amplitude: 4.75 mV
Lead Channel Sensing Intrinsic Amplitude: 4.75 mV
Lead Channel Setting Pacing Amplitude: 1.5 V
Lead Channel Setting Pacing Amplitude: 2.5 V
Lead Channel Setting Pacing Pulse Width: 0.4 ms
Lead Channel Setting Sensing Sensitivity: 2 mV
Zone Setting Status: 755011
Zone Setting Status: 755011

## 2022-12-30 NOTE — Patient Instructions (Signed)
Medication Instructions:  Your physician recommends that you continue on your current medications as directed. Please refer to the Current Medication list given to you today.  *If you need a refill on your cardiac medications before your next appointment, please call your pharmacy*  Lab Work: None ordered If you have labs (blood work) drawn today and your tests are completely normal, you will receive your results only by: MyChart Message (if you have MyChart) OR A paper copy in the mail If you have any lab test that is abnormal or we need to change your treatment, we will call you to review the results.  Follow-Up: At [facility], you and your health needs are our priority.  As part of our continuing mission to provide you with exceptional heart care, we have created designated Provider Care Teams.  These Care Teams include your primary Cardiologist (physician) and Advanced Practice Providers (APPs -  Physician Assistants and Nurse Practitioners) who all work together to provide you with the care you need, when you need it.  Your next appointment:   3 month(s)  Provider:   Michael "Andy" Tillery, PA-C  

## 2023-01-04 ENCOUNTER — Inpatient Hospital Stay: Payer: Medicare Other | Attending: Internal Medicine | Admitting: Internal Medicine

## 2023-01-04 VITALS — BP 152/69 | HR 60 | Temp 97.5°F | Resp 16 | Ht 65.0 in | Wt 185.2 lb

## 2023-01-04 DIAGNOSIS — C7800 Secondary malignant neoplasm of unspecified lung: Secondary | ICD-10-CM | POA: Insufficient documentation

## 2023-01-04 DIAGNOSIS — C642 Malignant neoplasm of left kidney, except renal pelvis: Secondary | ICD-10-CM | POA: Diagnosis not present

## 2023-01-04 DIAGNOSIS — D693 Immune thrombocytopenic purpura: Secondary | ICD-10-CM | POA: Insufficient documentation

## 2023-01-04 DIAGNOSIS — C7970 Secondary malignant neoplasm of unspecified adrenal gland: Secondary | ICD-10-CM | POA: Insufficient documentation

## 2023-01-04 DIAGNOSIS — Z79899 Other long term (current) drug therapy: Secondary | ICD-10-CM | POA: Diagnosis not present

## 2023-01-04 DIAGNOSIS — I10 Essential (primary) hypertension: Secondary | ICD-10-CM | POA: Insufficient documentation

## 2023-01-04 DIAGNOSIS — Z905 Acquired absence of kidney: Secondary | ICD-10-CM | POA: Diagnosis not present

## 2023-01-04 DIAGNOSIS — Z7901 Long term (current) use of anticoagulants: Secondary | ICD-10-CM | POA: Insufficient documentation

## 2023-01-04 DIAGNOSIS — C3490 Malignant neoplasm of unspecified part of unspecified bronchus or lung: Secondary | ICD-10-CM

## 2023-01-04 NOTE — Progress Notes (Signed)
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  He initially presented with localized disease in 2020.  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  He presented with a platelet count of less than 5 and subdural hematoma.  He had a complete response to IVIG and dexamethasone.    PRIOR THERAPY: 1) Status post left radical nephrectomy completed by Dr. Alvester Morin on October 18, 2018.  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  2) Ipilimumab 1 mg/kg and nivolumab 3 mg/kg started on October 01, 2021.  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   CURRENT THERAPY: Observation.  INTERVAL HISTORY: Ayman Brull. 82 y.o. male returns to the clinic today for follow-up visit accompanied by his wife.  Discussed the use of AI scribe software for clinical note transcription with the patient, who gave verbal consent to proceed.  History of Present Illness   The patient, an 82 year old with a history of left kidney cancer, had a nephrectomy in 2020. In 2023, new pulmonary lesions were identified, and the patient was started on immune therapy with ipilimumab and nivolumab. However, the patient could only tolerate two cycles of this treatment due to side effects. The patient also has a history of idiopathic thrombocytopenic purpura (ITP), with low platelet counts managed in the past with IVIG and prednisone.  The patient reports feeling generally well, with the ability to perform yard work without any new symptoms or complaints since the last visit. However, the patient has been experiencing persistent dry mouth and lip discomfort, a side effect from the  immune therapy received over a year ago. This has been managed with biotin spray, which provides some relief.  The patient also has a pacemaker and has been seen by a cardiologist recently. The patient has a history of significant bleeding when the platelet count dropped below 50,000, which led to discontinuation of blood thinners. The patient also takes metoprolol for blood pressure control and heart health.      MEDICAL HISTORY: Past Medical History:  Diagnosis Date   Abdominal aortic aneurysm (AAA) without rupture 06/23/2018   Age-related osteoporosis without current pathological fracture 07/14/2021   BPH (benign prostatic hyperplasia) 01/19/2010   Cervical radiculopathy 06/16/2018   Chronic anticoagulation 08/22/2018   Chronic bilateral low back pain without sciatica 07/18/2019   Chronic sinusitis 05/18/2021   Diverticulosis    Erectile dysfunction due to arterial insufficiency 04/23/2015   Esophageal stricture    Essential hypertension 05/01/2018   Estimated Creatinine Clearance: 44.1 mL/min (by C-G formula based on SCr of 1.35 mg/dL).   GERD (gastroesophageal reflux disease) 11/10/2010   Hearing loss    Hiatal hernia    Hyperlipidemia with target LDL less than 70 11/28/2012   The 10-year ASCVD risk score Denman George DC Jr., et al., 2013) is: 30.7%   Values used to calculate the score:     Age: 87 years     Sex: Male     Is Non-Hispanic African American: No     Diabetic: No     Tobacco smoker: No     Systolic Blood Pressure: 132 mmHg     Is BP treated: No  HDL Cholesterol: 30.8 mg/dL     Total Cholesterol: 183 mg/dL   Hypoglycemia    Iron deficiency anemia due to dietary causes 12/07/2018   Lung nodule seen on imaging study 06/23/2018   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 08/22/2018    a Medtronic Azure XT MRI conditional dual-chamber pacemaker for symptomatic sinus pauses, sick sinus syndrome, and syncope   Migraine headache    OAB (overactive bladder)  01/06/2016   PAF (paroxysmal atrial fibrillation)    Echo with normal LV function and mild LVH   Prediabetes 12/06/2018   Pure hyperglyceridemia 11/22/2007   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   Estimated Creatinine Clearance: 51.7 mL/min (by C-G formula based on SCr of 1.15 mg/dL).   Syncope    presented with atrial fib converted with Diltiazem   Systemic inflammatory response syndrome 06/26/2018   Thrombocytopenia 01/19/2021    ALLERGIES:  is allergic to hydrocodone, monosodium glutamate, naproxen sodium, and other.  MEDICATIONS:  Current Outpatient Medications  Medication Sig Dispense Refill   acetaminophen (TYLENOL) 500 MG tablet Take 1,000 mg by mouth every 6 (six) hours as needed for mild pain.     Calcium Carbonate (CALCIUM 500 PO) Take 500 mg by mouth 2 (two) times daily.     chlorhexidine (PERIDEX) 0.12 % solution Use as directed 15 mLs in the mouth or throat 2 (two) times daily.     denosumab (PROLIA) 60 MG/ML SOSY injection Inject 60 mg into the skin every 6 (six) months.     esomeprazole (NEXIUM) 40 MG capsule Take 1 capsule (40 mg total) by mouth daily before breakfast. 90 capsule 1   magic mouthwash (nystatin, diphenhydrAMINE, alum & mag hydroxide) suspension mixture Swish and swallow 5 mLs 3 (three) times daily as needed for mouth pain. 240 mL 2   metoprolol succinate (TOPROL XL) 25 MG 24 hr tablet Take 1 tablet (25 mg total) by mouth at bedtime. 90 tablet 3   Multiple Vitamins-Minerals (CENTRUM MULTI + OMEGA 3 PO) Take 1 tablet by mouth daily.      omega-3 acid ethyl esters (LOVAZA) 1 g capsule Take 2 capsules by mouth twice daily 360 capsule 0   rosuvastatin (CRESTOR) 10 MG tablet Take 1 tablet by mouth once daily 90 tablet 0   solifenacin  (VESICARE) 10 MG tablet TAKE 1 TABLET BY MOUTH AT BEDTIME 90 tablet 0   VITAMIN D PO Take 1,000 Units by mouth daily.     No current facility-administered medications for this visit.    SURGICAL HISTORY:  Past Surgical History:  Procedure Laterality Date   CYSTOSCOPY WITH URETEROSCOPY AND STENT PLACEMENT Left 08/16/2018   Procedure: CYSTOSCOPY WITH LEFT RETROGRADE PYELOGRAM, BALLOON DILATION LEFT URETER, STENT PLACEMENT;  Surgeon: Crista Elliot, MD;  Location: [facility];  Service: Urology;  Laterality: Left;   HERNIA REPAIR     INSERT / REPLACE / REMOVE PACEMAKER     LAPAROSCOPIC ASSISTED VENTRAL HERNIA REPAIR N/A 10/18/2018   Procedure: LAPAROSCOPIC ASSISTED VENTRAL WALL HERNIA REPAIR WITH MESH;  Surgeon: Karie Soda, MD;  Location: [facility];  Service: General;  Laterality: N/A;   LAPAROSCOPIC NEPHRECTOMY, HAND ASSISTED Left 10/18/2018   Procedure: LEFT HAND ASSISTED LAPAROSCOPIC RADICAL NEPHRECTOMY WITH ADRENALECTOMY;  Surgeon: Ray Church  III, MD;  Location: [facility];  Service: Urology;  Laterality: Left;   LOOP RECORDER INSERTION N/A 04/26/2018   Procedure: LOOP RECORDER INSERTION;  Surgeon: Hillis Range, MD;  Location: [facility];  Service: Cardiovascular;  Laterality: N/A;   LOOP RECORDER REMOVAL N/A 07/18/2018   Procedure: LOOP RECORDER REMOVAL;  Surgeon: Hillis Range, MD;  Location: [facility];  Service: Cardiovascular;  Laterality: N/A;   NASAL SEPTUM SURGERY     PACEMAKER IMPLANT N/A 07/18/2018   Procedure: PACEMAKER IMPLANT;  Surgeon: Hillis Range, MD;  Location: [facility];  Service: Cardiovascular;  Laterality: N/A;   TONSILLECTOMY     TONSILLECTOMY     UMBILICAL HERNIA REPAIR  1993   WISDOM TOOTH EXTRACTION     about 82 years old    REVIEW OF SYSTEMS:  Constitutional: positive for fatigue Eyes: negative Ears, nose, mouth, throat, and face: positive for dry mouth Respiratory: negative Cardiovascular: negative Gastrointestinal:  negative Genitourinary:negative Integument/breast: negative Hematologic/lymphatic: negative Musculoskeletal:negative Neurological: negative Behavioral/Psych: negative Endocrine: negative Allergic/Immunologic: negative   PHYSICAL EXAMINATION: General appearance: alert, cooperative, and fatigued Head: Normocephalic, without obvious abnormality, atraumatic Neck: no adenopathy, no JVD, supple, symmetrical, trachea midline, and thyroid not enlarged, symmetric, no tenderness/mass/nodules Lymph nodes: Cervical, supraclavicular, and axillary nodes normal. Resp: clear to auscultation bilaterally and normal percussion bilaterally Back: symmetric, no curvature. ROM normal. No CVA tenderness. Cardio: regular rate and rhythm, S1, S2 normal, no murmur, click, rub or gallop GI: soft, non-tender; bowel sounds normal; no masses,  no organomegaly Extremities: extremities normal, atraumatic, no cyanosis or edema Neurologic: Alert and oriented X 3, normal strength and tone. Normal symmetric reflexes. Normal coordination and gait  ECOG PERFORMANCE STATUS: 1 - Symptomatic but completely ambulatory  Blood pressure (!) 152/69, pulse 60, temperature (!) 97.5 F (36.4 C), temperature source Oral, resp. rate 16, height 5\' 5"  (1.651 m), weight 185 lb 3.2 oz (84 kg), SpO2 100%.  LABORATORY DATA: Lab Results  Component Value Date   WBC 5.8 12/28/2022   HGB 14.7 12/28/2022   HCT 41.8 12/28/2022   MCV 94.4 12/28/2022   PLT 97 (L) 12/28/2022      Chemistry      Component Value Date/Time   NA 130 (L) 12/28/2022 1010   NA 135 07/21/2020 1211   K 4.9 12/28/2022 1010   K 3.8 11/09/2013 0000   CL 98 12/28/2022 1010   CL 105 11/09/2013 0000   CO2 27 12/28/2022 1010   CO2 26 11/09/2013 0000   BUN 15 12/28/2022 1010   BUN 18 07/21/2020 1211   CREATININE 1.02 12/28/2022 1010   CREATININE 0.66 11/09/2013 0000      Component Value Date/Time   CALCIUM 9.9 12/28/2022 1010   CALCIUM 8.7 11/09/2013 0000    ALKPHOS 36 (L) 12/28/2022 1010   ALKPHOS 55 11/09/2013 0000   AST 22 12/28/2022 1010   ALT 22 12/28/2022 1010   BILITOT 0.7 12/28/2022 1010       RADIOGRAPHIC STUDIES: CUP PACEART INCLINIC DEVICE CHECK  Result Date: 12/30/2022 Pacemaker check in clinic. Normal device function. Thresholds, sensing, impedances consistent with previous measurements. Device programmed to maximize longevity. 1 episode of AF, >20 hrs; See Epic for [facility] discussion. Device programmed at appropriate safety margins. Histogram distribution appropriate for patient activity level. Estimated longevity 10 yr. Patient enrolled in remote follow-up. Patient education completed.   PVC counters are similar to last check, slightly higher, but symptoms have improved on Toprol. Refuses AADs.  CT Chest  Wo Contrast  Result Date: 12/28/2022 CLINICAL DATA:  Non-small cell lung cancer (NSCLC), staging. * Tracking Code: BO * EXAM: CT CHEST, ABDOMEN AND PELVIS WITHOUT CONTRAST TECHNIQUE: Multidetector CT imaging of the chest, abdomen and pelvis was performed following the standard protocol without IV contrast. RADIATION DOSE REDUCTION: This exam was performed according to the departmental dose-optimization program which includes automated exposure control, adjustment of the mA and/or kV according to patient size and/or use of iterative reconstruction technique. COMPARISON:  CT scan chest, abdomen and pelvis from 09/30/2022. FINDINGS: CT CHEST FINDINGS Cardiovascular: Normal cardiac size. No pericardial effusion. No aortic aneurysm. There are coronary artery calcifications, in keeping with coronary artery disease. There are also mild-to-moderate peripheral atherosclerotic vascular calcifications of thoracic aorta and its major branches. Mediastinum/Nodes: Visualized thyroid gland appears grossly unremarkable. No solid / cystic mediastinal masses. The esophagus is nondistended precluding optimal assessment. There is a small sliding hiatal hernia.  There are few mildly prominent mediastinal and hilar lymph nodes, which do not meet the size criteria for lymphadenopathy and though indeterminate most likely benign in etiology. No axillary lymphadenopathy by size criteria. Lungs/Pleura: The central tracheo-bronchial tree is patent. 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 There are multiple emphysematous bulla predominantly in the right upper lobe. There also bilateral peripheral/subpleural reticulations throughout bilateral lungs with lower lobe predominance. There also associated areas of bronchiectasis, favoring UIP pattern. Redemonstration of multiple solid noncalcified nodules (marked with electronic arrow sign on series 100) mainly in the bilateral lower lobes without significant interval change since the prior study. The index lung nodule is in the middle lobe, abutting the major fissure measuring up to 9-10 mm. No new or suspicious lung nodule. No lung mass, consolidation, pleural effusion or pneumothorax. Musculoskeletal: Bilateral mild symmetric gynecomastia noted. A dual lead cardiac pacemaker / defibrillator is noted with its battery pack overlying the left chest wall and the leads terminating in the right atrium and apex of right ventricle. Visualized soft tissues of the chest wall are otherwise grossly unremarkable. No suspicious osseous lesions. There are mild multilevel degenerative changes in the visualized spine. Redemonstration of marked compression deformities of T12 and L1 vertebral body without significant interval change. CT ABDOMEN PELVIS FINDINGS Hepatobiliary: The liver is normal in size. Non-cirrhotic configuration. No suspicious mass. No intrahepatic or extrahepatic bile duct dilation. No calcified gallstones. Normal gallbladder wall thickness. No pericholecystic inflammatory changes. Pancreas: Likely surgically absent distal pancreas.  Unremarkable proximal pancreas except fatty infiltration. No suspicious mass. No pancreatic ductal dilatation or surrounding inflammatory changes. Spleen: Within normal limits. No focal lesion. Redemonstration of linear calcification along the capsule of the posterior inferior spleen, likely sequela of prior hemorrhage. Adrenals/Urinary Tract: Adrenal glands are unremarkable. Surgically absent left kidney. Unremarkable right kidney. No suspicious renal mass. No hydronephrosis. No renal or ureteric calculi. Unremarkable urinary bladder. Stomach/Bowel: No disproportionate dilation of the small or large bowel loops. No evidence of abnormal bowel wall thickening or inflammatory changes. The appendix is unremarkable. There are multiple diverticula throughout the colon, without imaging signs of diverticulitis. Vascular/Lymphatic: No ascites or pneumoperitoneum. Redemonstration of an approximately 1.3 x 1.5 cm macroscopic fat attenuation area with thin peripheral hyperattenuating rim, abutting the intrahepatic IVC, which is nonspecific but preferred benign and may represent sequela of fat necrosis. No abdominal or pelvic lymphadenopathy, by size criteria. Redemonstration of saccular aneurysmal dilation of infrarenal aorta measuring up to 3.3 cm in diameter, grossly similar to the prior study no other aneurysmal  dilation of the major abdominal arteries. There are moderate peripheral atherosclerotic vascular calcifications of the aorta and its major branches. Reproductive: Normal size prostate. Symmetric seminal vesicles. Other: There is mild divarication of recti mainly in the periumbilical region. There are small fat containing bilateral inguinal hernias. Musculoskeletal: No suspicious osseous lesions. There are mild - moderate multilevel degenerative changes in the visualized spine. Redemonstration of T12 and L1 compression deformities. IMPRESSION: 1. Essentially stable exam. 2. Redemonstration of multiple solid nodules  in bilateral lungs with largest nodule measuring up to 1 cm, compatible with metastases. No new suspicious lung nodule. 3. Otherwise, no metastatic disease identified within the chest, abdomen or pelvis. 4. 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 Recommend follow-up ultrasound every 3 years. (Ref.: J Vasc Surg. 2018; 67:2-77 and J Am Coll Radiol 2013;10(10):789-794.) 5. Multiple other nonacute observations, as described above. Electronically Signed   By: Jules Schick M.D.   On: 12/28/2022 11:43   CT ABDOMEN PELVIS WO CONTRAST  Result Date: 12/28/2022 CLINICAL DATA:  Non-small cell lung cancer (NSCLC), staging. * Tracking Code: BO * EXAM: CT CHEST, ABDOMEN AND PELVIS WITHOUT CONTRAST TECHNIQUE: Multidetector CT imaging of the chest, abdomen and pelvis was performed following the standard protocol without IV contrast. RADIATION DOSE REDUCTION: This exam was performed according to the departmental dose-optimization program which includes automated exposure control, adjustment of the mA and/or kV according to patient size and/or use of iterative reconstruction technique. COMPARISON:  CT scan chest, abdomen and pelvis from 09/30/2022. FINDINGS: CT CHEST FINDINGS Cardiovascular: Normal cardiac size. No pericardial effusion. No aortic aneurysm. There are coronary artery calcifications, in keeping with coronary artery disease. There are also mild-to-moderate peripheral atherosclerotic vascular calcifications of thoracic aorta and its major branches. Mediastinum/Nodes: Visualized thyroid gland appears grossly unremarkable. No solid / cystic mediastinal masses. The esophagus is nondistended precluding optimal assessment. There is a small sliding hiatal hernia. There are few mildly prominent mediastinal and hilar lymph nodes, which do not meet the size criteria for lymphadenopathy and though indeterminate most likely benign in etiology. No axillary  lymphadenopathy by size criteria. Lungs/Pleura: The central tracheo-bronchial tree is patent. 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 There are multiple emphysematous bulla predominantly in the right upper lobe. There also bilateral peripheral/subpleural reticulations throughout bilateral lungs with lower lobe predominance. There also associated areas of bronchiectasis, favoring UIP pattern. Redemonstration of multiple solid noncalcified nodules (marked with electronic arrow sign on series 100) mainly in the bilateral lower lobes without significant interval change since the prior study. The index lung nodule is in the middle lobe, abutting the major fissure measuring up to 9-10 mm. No new or suspicious lung nodule. No lung mass, consolidation, pleural effusion or pneumothorax. Musculoskeletal: Bilateral mild symmetric gynecomastia noted. A dual lead cardiac pacemaker / defibrillator is noted with its battery pack overlying the left chest wall and the leads terminating in the right atrium and apex of right ventricle. Visualized soft tissues of the chest wall are otherwise grossly unremarkable. No suspicious osseous lesions. There are mild multilevel degenerative changes in the visualized spine. Redemonstration of marked compression deformities of T12 and L1 vertebral body without significant interval change. CT ABDOMEN PELVIS FINDINGS Hepatobiliary: The liver is normal in size. Non-cirrhotic configuration. No suspicious mass. No intrahepatic or extrahepatic bile duct dilation. No calcified gallstones. Normal gallbladder wall thickness. No pericholecystic inflammatory changes. Pancreas: Likely surgically absent distal pancreas. Unremarkable proximal  pancreas except fatty infiltration. No suspicious mass. No pancreatic ductal dilatation or surrounding inflammatory changes. Spleen: Within normal limits. No focal  lesion. Redemonstration of linear calcification along the capsule of the posterior inferior spleen, likely sequela of prior hemorrhage. Adrenals/Urinary Tract: Adrenal glands are unremarkable. Surgically absent left kidney. Unremarkable right kidney. No suspicious renal mass. No hydronephrosis. No renal or ureteric calculi. Unremarkable urinary bladder. Stomach/Bowel: No disproportionate dilation of the small or large bowel loops. No evidence of abnormal bowel wall thickening or inflammatory changes. The appendix is unremarkable. There are multiple diverticula throughout the colon, without imaging signs of diverticulitis. Vascular/Lymphatic: No ascites or pneumoperitoneum. Redemonstration of an approximately 1.3 x 1.5 cm macroscopic fat attenuation area with thin peripheral hyperattenuating rim, abutting the intrahepatic IVC, which is nonspecific but preferred benign and may represent sequela of fat necrosis. No abdominal or pelvic lymphadenopathy, by size criteria. 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 There are moderate peripheral atherosclerotic vascular calcifications of the aorta and its major branches. Reproductive: Normal size prostate. Symmetric seminal vesicles. Other: There is mild divarication of recti mainly in the periumbilical region. There are small fat containing bilateral inguinal hernias. Musculoskeletal: No suspicious osseous lesions. There are mild - moderate multilevel degenerative changes in the visualized spine. Redemonstration of T12 and L1 compression deformities. IMPRESSION: 1. Essentially stable exam. 2. Redemonstration of multiple solid nodules in bilateral lungs with largest nodule measuring up to 1 cm, compatible with metastases. No new suspicious lung nodule. 3. Otherwise, no metastatic disease identified within the chest,  abdomen or pelvis. 4. 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 Recommend follow-up ultrasound every 3 years. (Ref.: J Vasc Surg. 2018; 67:2-77 and J Am Coll Radiol 2013;10(10):789-794.) 5. Multiple other nonacute observations, as described above. Electronically Signed   By: Jules Schick M.D.   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  He initially presented with localized disease in 2020.  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  He presented with a platelet count of less than 5 and subdural hematoma.  He had a complete response to IVIG and dexamethasone.  He is status post left radical nephrectomy completed by Dr. Alvester Morin on October 18, 2018.  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  The patient was then treated with Ipilimumab 1 mg/kg and nivolumab 3 mg/kg started on October 01, 2021.  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 The patient is currently on observation and he is feeling fine.    Metastatic Renal Cell Carcinoma History of left nephrectomy in 2020. New lung lesions in 2023. Tolerated only two cycles of ipilimumab and nivolumab due to side effects. Currently asymptomatic with stable disease on imaging. -Continue current management and surveillance. -Repeat imaging and labs in 6 months.  Idiopathic Thrombocytopenic Purpura (ITP) History of low platelets, previously treated with IVIG and prednisone. Current platelet count 97,000, not dangerously low and no active bleeding. -No treatment needed at this time for low platelets.  Hypertension Elevated blood pressure at visit. Currently on metoprolol 25mg . -Continue metoprolol  25mg . -Encouraged patient to monitor  blood pressure at home.  Xerostomia Persistent dry mouth and lips since immune therapy in 2023. Using biotin spray for symptom relief. -Continue biotin spray as needed.   The patient was advised to call immediately if he has any other concerning symptoms in the interval. The patient voices understanding of current disease status and treatment options and is in agreement with the current care plan.  All questions were answered. The patient knows to call the clinic with any problems, questions or concerns. We can certainly see the patient much sooner if necessary.  The total time spent in the appointment was 30 minutes.  Disclaimer: This note was dictated with voice recognition software. Similar sounding words can inadvertently be transcribed and may not be corrected upon review.

## 2023-01-10 ENCOUNTER — Encounter: Payer: Medicare Other | Admitting: Cardiology

## 2023-01-11 ENCOUNTER — Other Ambulatory Visit: Payer: Self-pay | Admitting: Internal Medicine

## 2023-01-11 DIAGNOSIS — B370 Candidal stomatitis: Secondary | ICD-10-CM

## 2023-01-11 DIAGNOSIS — C649 Malignant neoplasm of unspecified kidney, except renal pelvis: Secondary | ICD-10-CM

## 2023-01-12 ENCOUNTER — Ambulatory Visit: Payer: Medicare Other

## 2023-01-12 DIAGNOSIS — I495 Sick sinus syndrome: Secondary | ICD-10-CM

## 2023-01-13 LAB — CUP PACEART REMOTE DEVICE CHECK
Battery Remaining Longevity: 119 mo
Battery Voltage: 3 V
Brady Statistic AP VP Percent: 0.25 %
Brady Statistic AP VS Percent: 79.87 %
Brady Statistic AS VP Percent: 0.01 %
Brady Statistic AS VS Percent: 19.86 %
Brady Statistic RA Percent Paced: 80.29 %
Brady Statistic RV Percent Paced: 0.26 %
Date Time Interrogation Session: 20241113033012
Implantable Lead Connection Status: 753985
Implantable Lead Connection Status: 753985
Implantable Lead Implant Date: 20200519
Implantable Lead Implant Date: 20200519
Implantable Lead Location: 753859
Implantable Lead Location: 753860
Implantable Lead Model: 5076
Implantable Lead Model: 5076
Implantable Pulse Generator Implant Date: 20200519
Lead Channel Impedance Value: 323 Ohm
Lead Channel Impedance Value: 399 Ohm
Lead Channel Impedance Value: 513 Ohm
Lead Channel Impedance Value: 532 Ohm
Lead Channel Pacing Threshold Amplitude: 0.5 V
Lead Channel Pacing Threshold Amplitude: 0.625 V
Lead Channel Pacing Threshold Pulse Width: 0.4 ms
Lead Channel Pacing Threshold Pulse Width: 0.4 ms
Lead Channel Sensing Intrinsic Amplitude: 4.125 mV
Lead Channel Sensing Intrinsic Amplitude: 4.125 mV
Lead Channel Sensing Intrinsic Amplitude: 7.25 mV
Lead Channel Sensing Intrinsic Amplitude: 7.25 mV
Lead Channel Setting Pacing Amplitude: 1.5 V
Lead Channel Setting Pacing Amplitude: 2.5 V
Lead Channel Setting Pacing Pulse Width: 0.4 ms
Lead Channel Setting Sensing Sensitivity: 2 mV
Zone Setting Status: 755011
Zone Setting Status: 755011

## 2023-01-20 ENCOUNTER — Other Ambulatory Visit: Payer: Self-pay | Admitting: Internal Medicine

## 2023-01-20 DIAGNOSIS — E785 Hyperlipidemia, unspecified: Secondary | ICD-10-CM

## 2023-01-31 ENCOUNTER — Encounter: Payer: Self-pay | Admitting: Internal Medicine

## 2023-01-31 ENCOUNTER — Ambulatory Visit: Payer: Medicare Other | Admitting: Internal Medicine

## 2023-01-31 ENCOUNTER — Telehealth: Payer: Self-pay | Admitting: Pharmacy Technician

## 2023-01-31 VITALS — BP 120/72 | HR 63 | Temp 97.7°F | Resp 16 | Ht 65.0 in | Wt 186.2 lb

## 2023-01-31 DIAGNOSIS — D696 Thrombocytopenia, unspecified: Secondary | ICD-10-CM | POA: Diagnosis not present

## 2023-01-31 DIAGNOSIS — E871 Hypo-osmolality and hyponatremia: Secondary | ICD-10-CM

## 2023-01-31 DIAGNOSIS — N1832 Chronic kidney disease, stage 3b: Secondary | ICD-10-CM | POA: Diagnosis not present

## 2023-01-31 DIAGNOSIS — N3000 Acute cystitis without hematuria: Secondary | ICD-10-CM | POA: Diagnosis not present

## 2023-01-31 DIAGNOSIS — Z23 Encounter for immunization: Secondary | ICD-10-CM

## 2023-01-31 DIAGNOSIS — M810 Age-related osteoporosis without current pathological fracture: Secondary | ICD-10-CM

## 2023-01-31 DIAGNOSIS — Z0001 Encounter for general adult medical examination with abnormal findings: Secondary | ICD-10-CM | POA: Diagnosis not present

## 2023-01-31 DIAGNOSIS — R7303 Prediabetes: Secondary | ICD-10-CM | POA: Diagnosis not present

## 2023-01-31 DIAGNOSIS — I10 Essential (primary) hypertension: Secondary | ICD-10-CM

## 2023-01-31 DIAGNOSIS — E785 Hyperlipidemia, unspecified: Secondary | ICD-10-CM

## 2023-01-31 DIAGNOSIS — E781 Pure hyperglyceridemia: Secondary | ICD-10-CM | POA: Diagnosis not present

## 2023-01-31 LAB — URINALYSIS, ROUTINE W REFLEX MICROSCOPIC
Bilirubin Urine: NEGATIVE
Hgb urine dipstick: NEGATIVE
Ketones, ur: NEGATIVE
Nitrite: POSITIVE — AB
Specific Gravity, Urine: 1.01 (ref 1.000–1.030)
Total Protein, Urine: NEGATIVE
Urine Glucose: NEGATIVE
Urobilinogen, UA: 0.2 (ref 0.0–1.0)
pH: 6 (ref 5.0–8.0)

## 2023-01-31 LAB — LIPID PANEL
Cholesterol: 129 mg/dL (ref 0–200)
HDL: 35.1 mg/dL — ABNORMAL LOW (ref >39.00)
LDL Cholesterol: 67 mg/dL (ref 0–99)
NonHDL: 93.78
Total CHOL/HDL Ratio: 4
Triglycerides: 136 mg/dL (ref 0.0–149.0)
VLDL: 27.2 mg/dL (ref 0.0–40.0)

## 2023-01-31 LAB — CBC WITH DIFFERENTIAL/PLATELET
Basophils Absolute: 0 10*3/uL (ref 0.0–0.1)
Basophils Relative: 0.5 % (ref 0.0–3.0)
Eosinophils Absolute: 0.1 10*3/uL (ref 0.0–0.7)
Eosinophils Relative: 1 % (ref 0.0–5.0)
HCT: 42.1 % (ref 39.0–52.0)
Hemoglobin: 14.3 g/dL (ref 13.0–17.0)
Lymphocytes Relative: 16.3 % (ref 12.0–46.0)
Lymphs Abs: 1.5 10*3/uL (ref 0.7–4.0)
MCHC: 33.9 g/dL (ref 30.0–36.0)
MCV: 99 fL (ref 78.0–100.0)
Monocytes Absolute: 0.7 10*3/uL (ref 0.1–1.0)
Monocytes Relative: 7.6 % (ref 3.0–12.0)
Neutro Abs: 6.9 10*3/uL (ref 1.4–7.7)
Neutrophils Relative %: 74.6 % (ref 43.0–77.0)
Platelets: 119 10*3/uL — ABNORMAL LOW (ref 150.0–400.0)
RBC: 4.25 Mil/uL (ref 4.22–5.81)
RDW: 14.8 % (ref 11.5–15.5)
WBC: 9.2 10*3/uL (ref 4.0–10.5)

## 2023-01-31 LAB — VITAMIN B12: Vitamin B-12: 1279 pg/mL — ABNORMAL HIGH (ref 211–911)

## 2023-01-31 LAB — PHOSPHORUS: Phosphorus: 4 mg/dL (ref 2.3–4.6)

## 2023-01-31 LAB — TSH: TSH: 2.15 u[IU]/mL (ref 0.35–5.50)

## 2023-01-31 LAB — FOLATE: Folate: >24.2 ng/mL (ref >5.9)

## 2023-01-31 LAB — VITAMIN D 25 HYDROXY (VIT D DEFICIENCY, FRACTURES): VITD: 93.58 ng/mL (ref 30.00–100.00)

## 2023-01-31 MED ORDER — NITROFURANTOIN MONOHYD MACRO 100 MG PO CAPS: 100.0000 mg | ORAL_CAPSULE | Freq: Two times a day (BID) | ORAL | 0 refills | Status: AC

## 2023-01-31 MED ORDER — SULFAMETHOXAZOLE-TRIMETHOPRIM 800-160 MG PO TABS: 1.0000 | ORAL_TABLET | Freq: Two times a day (BID) | ORAL | 0 refills | Status: DC

## 2023-01-31 NOTE — Patient Instructions (Signed)
Health Maintenance, Male Adopting a healthy lifestyle and getting preventive care are important in promoting health and wellness. Ask your health care provider about: The right schedule for you to have regular tests and exams. Things you can do on your own to prevent diseases and keep yourself healthy. What should I know about diet, weight, and exercise? Eat a healthy diet  Eat a diet that includes plenty of vegetables, fruits, low-fat dairy products, and lean protein. Do not eat a lot of foods that are high in solid fats, added sugars, or sodium. Maintain a healthy weight Body mass index (BMI) is a measurement that can be used to identify possible weight problems. It estimates body fat based on height and weight. Your health care provider can help determine your BMI and help you achieve or maintain a healthy weight. Get regular exercise Get regular exercise. This is one of the most important things you can do for your health. Most adults should: Exercise for at least 150 minutes each week. The exercise should increase your heart rate and make you sweat (moderate-intensity exercise). Do strengthening exercises at least twice a week. This is in addition to the moderate-intensity exercise. Spend less time sitting. Even light physical activity can be beneficial. Watch cholesterol and blood lipids Have your blood tested for lipids and cholesterol at 81 years of age, then have this test every 5 years. You may need to have your cholesterol levels checked more often if: Your lipid or cholesterol levels are high. You are older than 81 years of age. You are at high risk for heart disease. What should I know about cancer screening? Many types of cancers can be detected early and may often be prevented. Depending on your health history and family history, you may need to have cancer screening at various ages. This may include screening for: Colorectal cancer. Prostate cancer. Skin cancer. Lung  cancer. What should I know about heart disease, diabetes, and high blood pressure? Blood pressure and heart disease High blood pressure causes heart disease and increases the risk of stroke. This is more likely to develop in people who have high blood pressure readings or are overweight. Talk with your health care provider about your target blood pressure readings. Have your blood pressure checked: Every 3-5 years if you are 18-39 years of age. Every year if you are 40 years old or older. If you are between the ages of 65 and 75 and are a current or former smoker, ask your health care provider if you should have a one-time screening for abdominal aortic aneurysm (AAA). Diabetes Have regular diabetes screenings. This checks your fasting blood sugar level. Have the screening done: Once every three years after age 45 if you are at a normal weight and have a low risk for diabetes. More often and at a younger age if you are overweight or have a high risk for diabetes. What should I know about preventing infection? Hepatitis B If you have a higher risk for hepatitis B, you should be screened for this virus. Talk with your health care provider to find out if you are at risk for hepatitis B infection. Hepatitis C Blood testing is recommended for: Everyone born from 1945 through 1965. Anyone with known risk factors for hepatitis C. Sexually transmitted infections (STIs) You should be screened each year for STIs, including gonorrhea and chlamydia, if: You are sexually active and are younger than 81 years of age. You are older than 81 years of age and your   health care provider tells you that you are at risk for this type of infection. Your sexual activity has changed since you were last screened, and you are at increased risk for chlamydia or gonorrhea. Ask your health care provider if you are at risk. Ask your health care provider about whether you are at high risk for HIV. Your health care provider  may recommend a prescription medicine to help prevent HIV infection. If you choose to take medicine to prevent HIV, you should first get tested for HIV. You should then be tested every 3 months for as long as you are taking the medicine. Follow these instructions at home: Alcohol use Do not drink alcohol if your health care provider tells you not to drink. If you drink alcohol: Limit how much you have to 0-2 drinks a day. Know how much alcohol is in your drink. In the [place] one drink equals one 12 oz bottle of beer (355 mL), one 5 oz glass of wine (148 mL), or one 1 oz glass of hard liquor (44 mL). Lifestyle Do not use any products that contain nicotine or tobacco. These products include cigarettes, chewing tobacco, and vaping devices, such as e-cigarettes. If you need help quitting, ask your health care provider. Do not use street drugs. Do not share needles. Ask your health care provider for help if you need support or information about quitting drugs. General instructions Schedule regular health, dental, and eye exams. Stay current with your vaccines. Tell your health care provider if: You often feel depressed. You have ever been abused or do not feel safe at home. Summary Adopting a healthy lifestyle and getting preventive care are important in promoting health and wellness. Follow your health care provider's instructions about healthy diet, exercising, and getting tested or screened for diseases. Follow your health care provider's instructions on monitoring your cholesterol and blood pressure. This information is not intended to replace advice given to you by your health care provider. Make sure you discuss any questions you have with your health care provider. Document Revised: 07/07/2020 Document Reviewed: 07/07/2020 [employer] Patient Education  2024 [employer]  

## 2023-01-31 NOTE — Progress Notes (Signed)
Subjective:  Patient ID: Frank Mcmurray., male    DOB: Jan 23, 1941  Age: 82 y.o. MRN: [id number]  CC: Annual Exam   HPI [name] presents for a CPX and f/up ----  Discussed the use of AI scribe software for clinical note transcription with the patient, who gave verbal consent to proceed.  History of Present Illness   The patient, with a history of atrial fibrillation and a single kidney, was recently hospitalized due to a significant bleeding episode. He experienced epistaxis and intracranial bleeding, which required a five-day intensive care stay to restore platelet levels. The patient associates this episode with a COVID-19 vaccination received the day prior, which was followed by extensive bruising on the arm. Post-hospitalization, the patient's cardiologists and neurologists advised against further use of blood thinners.   The patient denies any recent bleeding or bruising episodes. He remains active, engaging in activities such as weed eating and using chainsaws, without experiencing chest pain or shortness of breath. He also denies any cold, clammy sweats or fatigue.       Outpatient Medications Prior to Visit  Medication Sig Dispense Refill   acetaminophen (TYLENOL) 500 MG tablet Take 1,000 mg by mouth every 6 (six) hours as needed for mild pain.     Calcium Carbonate (CALCIUM 500 PO) Take 500 mg by mouth 2 (two) times daily.     denosumab (PROLIA) 60 MG/ML SOSY injection Inject 60 mg into the skin every 6 (six) months.     esomeprazole (NEXIUM) 40 MG capsule Take 1 capsule (40 mg total) by mouth daily before breakfast. 90 capsule 1   metoprolol succinate (TOPROL XL) 25 MG 24 hr tablet Take 1 tablet (25 mg total) by mouth at bedtime. 90 tablet 3   Multiple Vitamins-Minerals (CENTRUM MULTI + OMEGA 3 PO) Take 1 tablet by mouth daily.      omega-3 acid ethyl esters (LOVAZA) 1 g capsule Take 2 capsules by mouth twice daily 360 capsule 0   rosuvastatin (CRESTOR) 10 MG  tablet Take 1 tablet by mouth once daily 90 tablet 0   solifenacin (VESICARE) 10 MG tablet TAKE 1 TABLET BY MOUTH AT BEDTIME 90 tablet 0   VITAMIN D PO Take 1,000 Units by mouth daily.     chlorhexidine (PERIDEX) 0.12 % solution Use as directed 15 mLs in the mouth or throat 2 (two) times daily.     magic mouthwash (nystatin, diphenhydrAMINE, alum & mag hydroxide) suspension mixture Swish and swallow 5 mLs 3 (three) times daily as needed for mouth pain. 240 mL 2   No facility-administered medications prior to visit.    ROS Review of Systems  Constitutional:  Negative for chills, diaphoresis, fatigue and fever.  HENT: Negative.    Eyes:  Negative for visual disturbance.  Respiratory:  Negative for cough, chest tightness and shortness of breath.   Cardiovascular:  Negative for chest pain, palpitations and leg swelling.  Gastrointestinal: Negative.  Negative for abdominal pain, diarrhea, nausea and vomiting.  Genitourinary:  Positive for dysuria. Negative for decreased urine volume, flank pain, hematuria and urgency.       Dark/cloudy urine  Musculoskeletal:  Negative for arthralgias and myalgias.  Skin: Negative.   Neurological: Negative.  Negative for dizziness and weakness.  Hematological:  Negative for adenopathy. Does not bruise/bleed easily.  Psychiatric/Behavioral:  Positive for confusion and decreased concentration.     Objective:  BP 120/72 (BP Location: Left Arm, Patient Position: Sitting, Cuff Size: Normal)   Pulse  63   Temp 97.7 F (36.5 C) (Oral)   Resp 16   Ht 5\' 5"  (1.651 m)   Wt 186 lb 3.2 oz (84.5 kg)   SpO2 94%   BMI 30.99 kg/m   BP Readings from Last 3 Encounters:  01/31/23 120/72  01/04/23 (!) 152/69  12/30/22 118/74    Wt Readings from Last 3 Encounters:  01/31/23 186 lb 3.2 oz (84.5 kg)  01/04/23 185 lb 3.2 oz (84 kg)  12/30/22 183 lb (83 kg)    Physical Exam Vitals reviewed.  Constitutional:      Appearance: Normal appearance.  HENT:      Mouth/Throat:     Mouth: Mucous membranes are moist.  Eyes:     General: No scleral icterus.    Conjunctiva/sclera: Conjunctivae normal.  Cardiovascular:     Rate and Rhythm: Normal rate and regular rhythm.     Heart sounds: No murmur heard.    No friction rub. No gallop.  Pulmonary:     Effort: Pulmonary effort is normal.     Breath sounds: No stridor. Examination of the right-upper field reveals decreased breath sounds. Examination of the left-upper field reveals decreased breath sounds. Examination of the right-middle field reveals decreased breath sounds. Examination of the left-middle field reveals decreased breath sounds. Examination of the right-lower field reveals decreased breath sounds. Examination of the left-lower field reveals decreased breath sounds. Decreased breath sounds present. No wheezing, rhonchi or rales.  Abdominal:     General: Abdomen is flat.     Palpations: There is no mass.     Tenderness: There is no abdominal tenderness. There is no guarding.     Hernia: No hernia is present.  Musculoskeletal:        General: Normal range of motion.     Cervical back: Neck supple.     Right lower leg: No edema.     Left lower leg: No edema.  Lymphadenopathy:     Cervical: No cervical adenopathy.  Skin:    General: Skin is warm and dry.  Neurological:     General: No focal deficit present.     Mental Status: He is alert. Mental status is at baseline.  Psychiatric:        Mood and Affect: Mood normal.        Behavior: Behavior normal.     Lab Results  Component Value Date   WBC 9.2 01/31/2023   HGB 14.3 01/31/2023   HCT 42.1 01/31/2023   PLT 119.0 (L) 01/31/2023   GLUCOSE 104 (H) 12/28/2022   CHOL 129 01/31/2023   TRIG 136.0 01/31/2023   HDL 35.10 (L) 01/31/2023   LDLDIRECT 59.0 01/17/2020   LDLCALC 67 01/31/2023   ALT 22 12/28/2022   AST 22 12/28/2022   NA 130 (L) 12/28/2022   K 4.9 12/28/2022   CL 98 12/28/2022   CREATININE 1.02 12/28/2022   BUN 15  12/28/2022   CO2 27 12/28/2022   TSH 2.15 01/31/2023   PSA 0.10 01/17/2020   INR 1.4 (H) 02/12/2022   HGBA1C 5.9 01/19/2021    CT Chest Wo Contrast  Result Date: 12/28/2022 CLINICAL DATA:  Non-small cell lung cancer (NSCLC), staging. * Tracking Code: BO * EXAM: CT CHEST, ABDOMEN AND PELVIS WITHOUT CONTRAST TECHNIQUE: Multidetector CT imaging of the chest, abdomen and pelvis was performed following the standard protocol without IV contrast. RADIATION DOSE REDUCTION: This exam was performed according to the departmental dose-optimization program which includes automated exposure control, adjustment of  the mA and/or kV according to patient size and/or use of iterative reconstruction technique. COMPARISON:  CT scan chest, abdomen and pelvis from 09/30/2022. FINDINGS: CT CHEST FINDINGS Cardiovascular: Normal cardiac size. No pericardial effusion. No aortic aneurysm. There are coronary artery calcifications, in keeping with coronary artery disease. There are also mild-to-moderate peripheral atherosclerotic vascular calcifications of thoracic aorta and its major branches. Mediastinum/Nodes: Visualized thyroid gland appears grossly unremarkable. No solid / cystic mediastinal masses. The esophagus is nondistended precluding optimal assessment. There is a small sliding hiatal hernia. There are few mildly prominent mediastinal and hilar lymph nodes, which do not meet the size criteria for lymphadenopathy and though indeterminate most likely benign in etiology. No axillary lymphadenopathy by size criteria. Lungs/Pleura: The central tracheo-bronchial tree is patent. 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 There are multiple emphysematous bulla predominantly in the right upper lobe. There also bilateral peripheral/subpleural reticulations throughout bilateral lungs with lower lobe predominance. There also associated  areas of bronchiectasis, favoring UIP pattern. Redemonstration of multiple solid noncalcified nodules (marked with electronic arrow sign on series 100) mainly in the bilateral lower lobes without significant interval change since the prior study. The index lung nodule is in the middle lobe, abutting the major fissure measuring up to 9-10 mm. No new or suspicious lung nodule. No lung mass, consolidation, pleural effusion or pneumothorax. Musculoskeletal: Bilateral mild symmetric gynecomastia noted. A dual lead cardiac pacemaker / defibrillator is noted with its battery pack overlying the left chest wall and the leads terminating in the right atrium and apex of right ventricle. Visualized soft tissues of the chest wall are otherwise grossly unremarkable. No suspicious osseous lesions. There are mild multilevel degenerative changes in the visualized spine. Redemonstration of marked compression deformities of T12 and L1 vertebral body without significant interval change. CT ABDOMEN PELVIS FINDINGS Hepatobiliary: The liver is normal in size. Non-cirrhotic configuration. No suspicious mass. No intrahepatic or extrahepatic bile duct dilation. No calcified gallstones. Normal gallbladder wall thickness. No pericholecystic inflammatory changes. Pancreas: Likely surgically absent distal pancreas. Unremarkable proximal pancreas except fatty infiltration. No suspicious mass. No pancreatic ductal dilatation or surrounding inflammatory changes. Spleen: Within normal limits. No focal lesion. Redemonstration of linear calcification along the capsule of the posterior inferior spleen, likely sequela of prior hemorrhage. Adrenals/Urinary Tract: Adrenal glands are unremarkable. Surgically absent left kidney. Unremarkable right kidney. No suspicious renal mass. No hydronephrosis. No renal or ureteric calculi. Unremarkable urinary bladder. Stomach/Bowel: No disproportionate dilation of the small or large bowel loops. No evidence of  abnormal bowel wall thickening or inflammatory changes. The appendix is unremarkable. There are multiple diverticula throughout the colon, without imaging signs of diverticulitis. Vascular/Lymphatic: No ascites or pneumoperitoneum. Redemonstration of an approximately 1.3 x 1.5 cm macroscopic fat attenuation area with thin peripheral hyperattenuating rim, abutting the intrahepatic IVC, which is nonspecific but preferred benign and may represent sequela of fat necrosis. No abdominal or pelvic lymphadenopathy, by size criteria. 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 There are moderate peripheral atherosclerotic vascular calcifications of the aorta and its major branches. Reproductive: Normal size prostate. Symmetric seminal vesicles. Other: There is mild divarication of recti mainly in the periumbilical region. There are small fat containing bilateral inguinal hernias. Musculoskeletal: No suspicious osseous lesions. There are mild - moderate multilevel degenerative changes in the visualized spine. Redemonstration of T12  and L1 compression deformities. IMPRESSION: 1. Essentially stable exam. 2. Redemonstration of multiple solid nodules in bilateral lungs with largest nodule measuring up to 1 cm, compatible with metastases. No new suspicious lung nodule. 3. Otherwise, no metastatic disease identified within the chest, abdomen or pelvis. 4. 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 Recommend follow-up ultrasound every 3 years. (Ref.: J Vasc Surg. 2018; 67:2-77 and J Am Coll Radiol 2013;10(10):789-794.) 5. Multiple other nonacute observations, as described above. Electronically Signed   By: Jules Schick M.D.   On: 12/28/2022 11:43   CT ABDOMEN PELVIS WO CONTRAST  Result Date: 12/28/2022 CLINICAL  DATA:  Non-small cell lung cancer (NSCLC), staging. * Tracking Code: BO * EXAM: CT CHEST, ABDOMEN AND PELVIS WITHOUT CONTRAST TECHNIQUE: Multidetector CT imaging of the chest, abdomen and pelvis was performed following the standard protocol without IV contrast. RADIATION DOSE REDUCTION: This exam was performed according to the departmental dose-optimization program which includes automated exposure control, adjustment of the mA and/or kV according to patient size and/or use of iterative reconstruction technique. COMPARISON:  CT scan chest, abdomen and pelvis from 09/30/2022. FINDINGS: CT CHEST FINDINGS Cardiovascular: Normal cardiac size. No pericardial effusion. No aortic aneurysm. There are coronary artery calcifications, in keeping with coronary artery disease. There are also mild-to-moderate peripheral atherosclerotic vascular calcifications of thoracic aorta and its major branches. Mediastinum/Nodes: Visualized thyroid gland appears grossly unremarkable. No solid / cystic mediastinal masses. The esophagus is nondistended precluding optimal assessment. There is a small sliding hiatal hernia. There are few mildly prominent mediastinal and hilar lymph nodes, which do not meet the size criteria for lymphadenopathy and though indeterminate most likely benign in etiology. No axillary lymphadenopathy by size criteria. Lungs/Pleura: The central tracheo-bronchial tree is patent. 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 There are multiple emphysematous bulla predominantly in the right upper lobe. There also bilateral peripheral/subpleural reticulations throughout bilateral lungs with lower lobe predominance. There also associated areas of bronchiectasis, favoring UIP pattern. Redemonstration of multiple solid noncalcified nodules (marked with electronic arrow sign on series 100) mainly in the bilateral lower lobes without  significant interval change since the prior study. The index lung nodule is in the middle lobe, abutting the major fissure measuring up to 9-10 mm. No new or suspicious lung nodule. No lung mass, consolidation, pleural effusion or pneumothorax. Musculoskeletal: Bilateral mild symmetric gynecomastia noted. A dual lead cardiac pacemaker / defibrillator is noted with its battery pack overlying the left chest wall and the leads terminating in the right atrium and apex of right ventricle. Visualized soft tissues of the chest wall are otherwise grossly unremarkable. No suspicious osseous lesions. There are mild multilevel degenerative changes in the visualized spine. Redemonstration of marked compression deformities of T12 and L1 vertebral body without significant interval change. CT ABDOMEN PELVIS FINDINGS Hepatobiliary: The liver is normal in size. Non-cirrhotic configuration. No suspicious mass. No intrahepatic or extrahepatic bile duct dilation. No calcified gallstones. Normal gallbladder wall thickness. No pericholecystic inflammatory changes. Pancreas: Likely surgically absent distal pancreas. Unremarkable proximal pancreas except fatty infiltration. No suspicious mass. No pancreatic ductal dilatation or surrounding inflammatory changes. Spleen: Within normal limits. No focal lesion. Redemonstration of linear calcification along the capsule of the posterior inferior spleen, likely sequela of prior hemorrhage. Adrenals/Urinary Tract: Adrenal glands are unremarkable. Surgically absent left kidney. Unremarkable right kidney. No suspicious renal mass. No hydronephrosis. No renal or ureteric calculi. Unremarkable urinary bladder. Stomach/Bowel:  No disproportionate dilation of the small or large bowel loops. No evidence of abnormal bowel wall thickening or inflammatory changes. The appendix is unremarkable. There are multiple diverticula throughout the colon, without imaging signs of diverticulitis. Vascular/Lymphatic: No  ascites or pneumoperitoneum. Redemonstration of an approximately 1.3 x 1.5 cm macroscopic fat attenuation area with thin peripheral hyperattenuating rim, abutting the intrahepatic IVC, which is nonspecific but preferred benign and may represent sequela of fat necrosis. No abdominal or pelvic lymphadenopathy, by size criteria. 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 There are moderate peripheral atherosclerotic vascular calcifications of the aorta and its major branches. Reproductive: Normal size prostate. Symmetric seminal vesicles. Other: There is mild divarication of recti mainly in the periumbilical region. There are small fat containing bilateral inguinal hernias. Musculoskeletal: No suspicious osseous lesions. There are mild - moderate multilevel degenerative changes in the visualized spine. Redemonstration of T12 and L1 compression deformities. IMPRESSION: 1. Essentially stable exam. 2. Redemonstration of multiple solid nodules in bilateral lungs with largest nodule measuring up to 1 cm, compatible with metastases. No new suspicious lung nodule. 3. Otherwise, no metastatic disease identified within the chest, abdomen or pelvis. 4. 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 Recommend follow-up ultrasound every 3 years. (Ref.: J Vasc Surg. 2018; 67:2-77 and J Am Coll Radiol 2013;10(10):789-794.) 5. Multiple other nonacute observations, as described above. Electronically Signed   By: Jules Schick M.D.   On: 12/28/2022 11:43    Assessment & Plan:  Need for immunization against influenza -     Flu Vaccine Trivalent High Dose (Fluad)  Pure hyperglyceridemia -     Lipid panel; Future  Essential hypertension - His BP is well controlled. -     TSH; Future -     Urinalysis, Routine w  reflex microscopic; Future  Hyperlipidemia with target LDL less than 70 -     Lipid panel; Future -     TSH; Future  Stage 3b chronic kidney disease -     Urinalysis, Routine w reflex microscopic; Future  Hyponatremia -     Sodium, urine, random; Future  Thrombocytopenia (HCC) -     CBC with Differential/Platelet; Future -     Folate; Future -     Vitamin B12; Future  Prediabetes  Age-related osteoporosis without current pathological fracture -     Phosphorus; Future -     VITAMIN D 25 Hydroxy (Vit-D Deficiency, Fractures); Future  Acute cystitis without hematuria -     Nitrofurantoin Monohyd Macro; Take 1 capsule (100 mg total) by mouth 2 (two) times daily for 5 days.  Dispense: 10 capsule; Refill: 0  Encounter for general adult medical examination with abnormal findings- Exam completed, labs reviewed, vaccines reviewed and updated, no cancer screenings indicated, pt ed material was given.      Follow-up: Return in about 6 months (around 08/01/2023).  Sanda Linger, MD

## 2023-01-31 NOTE — Telephone Encounter (Signed)
-----   Message from Sanda Linger sent at 01/31/2023  1:36 PM EST ----- Regarding: Prolia Will you see if he can continue getting prolia injections?

## 2023-02-01 LAB — SODIUM, URINE, RANDOM: Sodium, Ur: 74 mmol/L (ref 28–272)

## 2023-02-03 NOTE — Progress Notes (Signed)
Remote pacemaker transmission.   

## 2023-02-07 ENCOUNTER — Other Ambulatory Visit (HOSPITAL_COMMUNITY): Payer: Self-pay

## 2023-02-07 NOTE — Telephone Encounter (Signed)
Patient made aware of his out of pocket cost. Patient has been scheduled.

## 2023-02-07 NOTE — Telephone Encounter (Signed)
Pt ready for scheduling for PROLIA on or after : 02/21/23  Out-of-pocket cost due at time of visit: $340  Primary: [employer] MEDICARE Prolia co-insurance: 20% Admin fee co-insurance: $20  Secondary: --- Prolia co-insurance:  Admin fee co-insurance:   Medical Benefit Details: Date Benefits were checked: 02/07/23 Deductible: NO/ Coinsurance: 20%/ Admin Fee: $20  Prior Auth: APPROVED  PA# [id number]  Expiration Date: 08/18/22-08/18/23  # of doses approved: 2  Pharmacy benefit: Copay $300 If patient wants fill through the pharmacy benefit please send prescription to: [facility], and include estimated need by date in rx notes. Pharmacy will ship medication directly to the office.  Patient NOT eligible for Prolia Copay Card. Copay Card can make patient's cost as little as $25. Link to apply: [email or web address]  ** This summary of benefits is an estimation of the patient's out-of-pocket cost. Exact cost may very based on individual plan coverage.

## 2023-02-07 NOTE — Telephone Encounter (Signed)
Prolia VOB initiated via [email or web address]  Next Prolia inj DUE: 02/21/23

## 2023-02-22 ENCOUNTER — Other Ambulatory Visit: Payer: Self-pay | Admitting: Internal Medicine

## 2023-02-22 DIAGNOSIS — E781 Pure hyperglyceridemia: Secondary | ICD-10-CM

## 2023-02-24 ENCOUNTER — Ambulatory Visit: Payer: Medicare Other

## 2023-02-24 DIAGNOSIS — M810 Age-related osteoporosis without current pathological fracture: Secondary | ICD-10-CM | POA: Diagnosis not present

## 2023-02-24 MED ORDER — DENOSUMAB 60 MG/ML ~~LOC~~ SOSY: 60.0000 mg | PREFILLED_SYRINGE | Freq: Once | SUBCUTANEOUS | Status: DC

## 2023-02-24 MED ORDER — DENOSUMAB 60 MG/ML ~~LOC~~ SOSY
60.0000 mg | PREFILLED_SYRINGE | Freq: Once | SUBCUTANEOUS | Status: AC
  Administered 2023-02-24: 60 mg via SUBCUTANEOUS

## 2023-02-24 NOTE — Addendum Note (Signed)
Addended byParticia Nearing on: 02/24/2023 10:19 AM   Modules accepted: Orders

## 2023-02-24 NOTE — Progress Notes (Signed)
PT visits today for their prolia injection. PT informed of what they were receiving and tolerated injection well. PT notified to reach out to office if needed.

## 2023-03-01 ENCOUNTER — Encounter: Payer: Self-pay | Admitting: Internal Medicine

## 2023-03-01 ENCOUNTER — Encounter: Payer: Self-pay | Admitting: Cardiovascular Disease

## 2023-03-07 DIAGNOSIS — H2513 Age-related nuclear cataract, bilateral: Secondary | ICD-10-CM | POA: Diagnosis not present

## 2023-03-08 ENCOUNTER — Telehealth: Payer: Self-pay | Admitting: Medical Oncology

## 2023-03-08 ENCOUNTER — Encounter: Payer: Self-pay | Admitting: Medical Oncology

## 2023-03-08 NOTE — Telephone Encounter (Signed)
 Received my chart message from pt. He  is applying for [facility] and [facility]. Message sent to HIM.

## 2023-03-22 DIAGNOSIS — D485 Neoplasm of uncertain behavior of skin: Secondary | ICD-10-CM | POA: Diagnosis not present

## 2023-03-22 DIAGNOSIS — L814 Other melanin hyperpigmentation: Secondary | ICD-10-CM | POA: Diagnosis not present

## 2023-03-24 DIAGNOSIS — C4442 Squamous cell carcinoma of skin of scalp and neck: Secondary | ICD-10-CM | POA: Diagnosis not present

## 2023-04-04 ENCOUNTER — Encounter: Payer: Self-pay | Admitting: Internal Medicine

## 2023-04-04 ENCOUNTER — Telehealth: Payer: Self-pay | Admitting: Cardiology

## 2023-04-04 ENCOUNTER — Ambulatory Visit: Payer: Medicare Other | Admitting: Internal Medicine

## 2023-04-04 ENCOUNTER — Telehealth: Payer: Self-pay | Admitting: Cardiovascular Disease

## 2023-04-04 VITALS — BP 108/62 | HR 72 | Ht 66.0 in | Wt 190.5 lb

## 2023-04-04 DIAGNOSIS — Z860101 Personal history of adenomatous and serrated colon polyps: Secondary | ICD-10-CM | POA: Diagnosis not present

## 2023-04-04 DIAGNOSIS — K219 Gastro-esophageal reflux disease without esophagitis: Secondary | ICD-10-CM

## 2023-04-04 DIAGNOSIS — Z860100 Personal history of colon polyps, unspecified: Secondary | ICD-10-CM

## 2023-04-04 DIAGNOSIS — I493 Ventricular premature depolarization: Secondary | ICD-10-CM

## 2023-04-04 MED ORDER — METOPROLOL SUCCINATE ER 25 MG PO TB24: 50.0000 mg | ORAL_TABLET | Freq: Every day | ORAL | 3 refills | Status: DC

## 2023-04-04 MED ORDER — ESOMEPRAZOLE MAGNESIUM 40 MG PO CPDR: 40.0000 mg | DELAYED_RELEASE_CAPSULE | Freq: Every day | ORAL | 3 refills | Status: DC

## 2023-04-04 NOTE — Patient Instructions (Addendum)
We have sent the following medications to your pharmacy for you to pick up at your convenience:  Nexium  Please follow up in one year  _______________________________________________________  If your blood pressure at your visit was 140/90 or greater, please contact your primary care physician to follow up on this.  _______________________________________________________  If you are age 81 or older, your body mass index should be between 23-30. Your Body mass index is 30.75 kg/m. If this is out of the aforementioned range listed, please consider follow up with your Primary Care Provider.  If you are age 6 or younger, your body mass index should be between 19-25. Your Body mass index is 30.75 kg/m. If this is out of the aformentioned range listed, please consider follow up with your Primary Care Provider.   ________________________________________________________  The [facility] providers would like to encourage you to use [facility] to communicate with providers for non-urgent requests or questions.  Due to long hold times on the telephone, sending your provider a message by [facility] may be a faster and more efficient way to get a response.  Please allow 48 business hours for a response.  Please remember that this is for non-urgent requests.  _______________________________________________________

## 2023-04-04 NOTE — Progress Notes (Signed)
HISTORY OF PRESENT ILLNESS:  Frank Fortunato. is a 83 y.o. male with a history of GERD and peptic stricture as well as adenomatous colon polyps who presents today for follow-up regarding GERD and request medication refill of his Nexium.  He also has a history of metastatic renal cell cancer and drug-induced xerostomia.  Last seen in this office December 29, 2021.  Patient tells me as long as he takes his Nexium he does well.  If he misses a dose, significant reflux symptoms.  No dysphagia.  GI review of systems is otherwise negative.  He is tolerating his medication well.  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  Last colonoscopy 2018 with diminutive adenoma.  Last upper endoscopy 2013  REVIEW OF SYSTEMS:  All non-GI ROS negative except for numbness of his lips  Past Medical History:  Diagnosis Date   Abdominal aortic aneurysm (AAA) without rupture 06/23/2018   Age-related osteoporosis without current pathological fracture 07/14/2021   BPH (benign prostatic hyperplasia) 01/19/2010   Cervical radiculopathy 06/16/2018   Chronic anticoagulation 08/22/2018   Chronic bilateral low back pain without sciatica 07/18/2019   Chronic sinusitis 05/18/2021   Diverticulosis    Erectile dysfunction due to arterial insufficiency 04/23/2015   Esophageal stricture    Essential hypertension 05/01/2018   Estimated Creatinine Clearance: 44.1 mL/min (by C-G formula based on SCr of 1.35 mg/dL).   GERD (gastroesophageal reflux disease) 11/10/2010   Hearing loss    Hiatal hernia    Hyperlipidemia with target LDL less than 70 11/28/2012   The 10-year ASCVD risk score Denman George DC Jr., et al., 2013) is: 30.7%   Values used to calculate the score:     Age: 60 years     Sex: Male     Is Non-Hispanic African American: No     Diabetic: No     Tobacco smoker: No     Systolic Blood Pressure: 132 mmHg     Is BP treated: No     HDL Cholesterol: 30.8 mg/dL     Total Cholesterol: 183 mg/dL    Hypoglycemia    Iron deficiency anemia due to dietary causes 12/07/2018   Lung nodule seen on imaging study 06/23/2018   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   Estimated Creatinine Clearance: 51.7 mL/min (by C-G formula based on SCr of 1.15 mg/dL).   Syncope    presented with atrial fib converted with Diltiazem   Systemic inflammatory response syndrome 06/26/2018   Thrombocytopenia 01/19/2021    Past Surgical History:  Procedure Laterality Date   CYSTOSCOPY WITH URETEROSCOPY AND STENT PLACEMENT Left 08/16/2018   Procedure: CYSTOSCOPY WITH LEFT RETROGRADE PYELOGRAM, BALLOON DILATION LEFT URETER, STENT PLACEMENT;  Surgeon: Crista Elliot, MD;  Location: [facility];  Service: Urology;  Laterality: Left;   HERNIA REPAIR     INSERT /  REPLACE / REMOVE PACEMAKER     LAPAROSCOPIC ASSISTED VENTRAL HERNIA REPAIR N/A 10/18/2018   Procedure: LAPAROSCOPIC ASSISTED VENTRAL WALL HERNIA REPAIR WITH MESH;  Surgeon: Karie Soda, MD;  Location: [facility];  Service: General;  Laterality: N/A;   LAPAROSCOPIC NEPHRECTOMY, HAND ASSISTED Left 10/18/2018   Procedure: LEFT HAND ASSISTED LAPAROSCOPIC RADICAL NEPHRECTOMY WITH ADRENALECTOMY;  Surgeon: Crista Elliot, MD;  Location: [facility];  Service: Urology;  Laterality: Left;   LOOP RECORDER INSERTION N/A 04/26/2018   Procedure: LOOP  RECORDER INSERTION;  Surgeon: Hillis Range, MD;  Location: [facility];  Service: Cardiovascular;  Laterality: N/A;   LOOP RECORDER REMOVAL N/A 07/18/2018   Procedure: LOOP RECORDER REMOVAL;  Surgeon: Hillis Range, MD;  Location: [facility];  Service: Cardiovascular;  Laterality: N/A;   NASAL SEPTUM SURGERY     PACEMAKER IMPLANT N/A 07/18/2018   Procedure: PACEMAKER IMPLANT;  Surgeon: Hillis Range, MD;  Location: [facility];  Service: Cardiovascular;  Laterality: N/A;   SKIN BIOPSY     TONSILLECTOMY     TONSILLECTOMY     UMBILICAL HERNIA REPAIR  1993   WISDOM TOOTH EXTRACTION     about 83 years old    Social History Frank Bebo.  reports that he quit smoking about 36 years ago. His smoking use included cigarettes. He has never used smokeless tobacco. He reports that he does not drink alcohol and does not use drugs.  family history includes Bipolar disorder in his paternal grandmother and sister; Dementia in his paternal grandmother; Emphysema in his father; Gallbladder disease in his sister; Memory loss in his paternal grandmother and sister.  Allergies  Allergen Reactions   Hydrocodone Other (See Comments)    Caused confusion    Monosodium Glutamate Other (See Comments)    Caused headaches   Naproxen Sodium     Mouth blisters with prolonged use.  Left nephrectomy 2020   Other     Perfume - headaches        PHYSICAL EXAMINATION: Vital signs: BP 108/62   Pulse 72   Ht 5\' 6"  (1.676 m)   Wt 190 lb 8 oz (86.4 kg)   SpO2 97%   BMI 30.75 kg/m   Constitutional: generally well-appearing, no acute distress Psychiatric: alert and oriented x3, cooperative Eyes: extraocular movements intact, anicteric, conjunctiva pink Mouth: oral pharynx moist, no lesions Neck: supple no lymphadenopathy Cardiovascular: heart occasional irregular beats, no murmur Lungs: clear to auscultation bilaterally Abdomen: soft, nontender, nondistended, no obvious ascites, no  peritoneal signs, normal bowel sounds, no organomegaly Rectal: Omitted Extremities: no clubbing, cyanosis, or lower extremity edema bilaterally Skin: no lesions on visible extremities Neuro: No focal deficits.  Hearing aids  ASSESSMENT:  1.  GERD.  Symptoms well-controlled with Nexium 2.  History of diminutive adenoma.  Aged out of surveillance   PLAN:  1.  Reflux precautions 2.  Refill Nexium.  Medication risk reviewed 3.  Routine office follow-up 1 year.  Contact the office in the interim for any questions or problems

## 2023-04-04 NOTE — Telephone Encounter (Signed)
Patient denies any recent sickness. Patient advised to take Toprol 50 mg at bedtime. Patient voiced understanding.

## 2023-04-04 NOTE — Telephone Encounter (Signed)
°  1. Has your device fired? no ° °2. Is you device beeping? no ° °3. Are you experiencing draining or swelling at device site?no ° °4. Are you calling to see if we received your device transmission?yes ° °5. Have you passed out? no ° ° ° °Please route to Device Clinic Pool °

## 2023-04-04 NOTE — Telephone Encounter (Signed)
Pt c/o medication issue:  1. Name of Medication: metoprolol succinate (TOPROL XL) 25 MG 24 hr tablet   2. How are you currently taking this medication (dosage and times per day)? As written  3. Are you having a reaction (difficulty breathing--STAT)? No   4. What is your medication issue? Pt wants to know if he can take one in the mornings if he is still not feeling good.

## 2023-04-04 NOTE — Telephone Encounter (Signed)
Patient reports of increased fatigue for several days and sent remote transmission concerning if he is in AF or frequent PVC's. Note increase in PVC's.  Reports compliance with Toprol-XL 25 mg at bedtime. Patient has question if he should adjust medication d/t PVC's. Last OV was with Mardelle Matte, PA-C. Will route to Mercedes to advise further.  Current BP:136-96.

## 2023-04-05 NOTE — Telephone Encounter (Signed)
Pt returned call. He said he got the metoprolol straightened out but if you needed anything else, he will be by his phone this time.

## 2023-04-05 NOTE — Telephone Encounter (Signed)
 Prentiss Almarie DASEN, RN     04/04/23 12:36 PM Note Patient denies any recent sickness. Patient advised to take Toprol  50 mg at bedtime. Patient voiced understanding.     Lesia Ozell Barter, PA-C to Prentiss Almarie DASEN, RN     04/04/23 11:42 AM Happy to increase the Toprol  to 50 mg at bedtime, or OK to continue to monitor if he is feeling any kind of illness coming on that could also be exacerbating.

## 2023-04-12 ENCOUNTER — Encounter: Payer: Medicare Other | Admitting: Student

## 2023-04-13 ENCOUNTER — Ambulatory Visit (INDEPENDENT_AMBULATORY_CARE_PROVIDER_SITE_OTHER): Payer: Medicare Other

## 2023-04-13 DIAGNOSIS — I495 Sick sinus syndrome: Secondary | ICD-10-CM | POA: Diagnosis not present

## 2023-04-14 ENCOUNTER — Encounter: Payer: Self-pay | Admitting: Cardiology

## 2023-04-14 LAB — CUP PACEART REMOTE DEVICE CHECK
Battery Remaining Longevity: 117 mo
Battery Voltage: 3 V
Brady Statistic AP VP Percent: 1.63 %
Brady Statistic AP VS Percent: 56.18 %
Brady Statistic AS VP Percent: 0.25 %
Brady Statistic AS VS Percent: 41.93 %
Brady Statistic RA Percent Paced: 64.86 %
Brady Statistic RV Percent Paced: 1.89 %
Date Time Interrogation Session: 20250212052226
Implantable Lead Connection Status: 753985
Implantable Lead Connection Status: 753985
Implantable Lead Implant Date: 20200519
Implantable Lead Implant Date: 20200519
Implantable Lead Location: 753859
Implantable Lead Location: 753860
Implantable Lead Model: 5076
Implantable Lead Model: 5076
Implantable Pulse Generator Implant Date: 20200519
Lead Channel Impedance Value: 323 Ohm
Lead Channel Impedance Value: 399 Ohm
Lead Channel Impedance Value: 494 Ohm
Lead Channel Impedance Value: 532 Ohm
Lead Channel Pacing Threshold Amplitude: 0.5 V
Lead Channel Pacing Threshold Amplitude: 0.5 V
Lead Channel Pacing Threshold Pulse Width: 0.4 ms
Lead Channel Pacing Threshold Pulse Width: 0.4 ms
Lead Channel Sensing Intrinsic Amplitude: 3.75 mV
Lead Channel Sensing Intrinsic Amplitude: 3.75 mV
Lead Channel Sensing Intrinsic Amplitude: 6.375 mV
Lead Channel Sensing Intrinsic Amplitude: 6.375 mV
Lead Channel Setting Pacing Amplitude: 1.5 V
Lead Channel Setting Pacing Amplitude: 2.5 V
Lead Channel Setting Pacing Pulse Width: 0.4 ms
Lead Channel Setting Sensing Sensitivity: 2 mV
Zone Setting Status: 755011
Zone Setting Status: 755011

## 2023-04-15 ENCOUNTER — Ambulatory Visit: Payer: Medicare Other | Admitting: Internal Medicine

## 2023-04-18 NOTE — Progress Notes (Unsigned)
  Electrophysiology Office Note:   ID:  [name], DOB 07/02/1940, MRN [id number]  Primary Cardiologist: Kristeen Miss, MD Electrophysiologist: Lanier Prude, MD  {Click to update primary MD,subspecialty MD or APP then REFRESH:1}    History of Present Illness:   [name] is a 83 y.o. male with h/o SSS s/p Medtronic PPM, PAF, and HLD seen today for routine electrophysiology followup.   Since last being seen in our clinic the patient reports doing ***.  he denies chest pain, palpitations, dyspnea, PND, orthopnea, nausea, vomiting, dizziness, syncope, edema, weight gain, or early satiety.   Review of systems complete and found to be negative unless listed in HPI.   EP Information / Studies Reviewed:    EKG is not ordered today. EKG from 12/30/2022 reviewed which showed NSR with A paced rhythm       PPM Interrogation-  reviewed in detail today,  See PACEART report.  Arrhythmia/Device History 07/18/2018 MDT dual chamber PPM  (loop removed)    Physical Exam:   VS:  There were no vitals taken for this visit.   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  CHA2DS2VASc of at least 3 Permission to rechallenge with eliquis would require explicit permission from Neurosurgery / Heme/Onc  H/o subdural hematoma  Spontaneous after COVID shot as  well as a (chronic) dose of prolia Not candidate for [facility] or [facility] at this time.   {Click here to Review PMH, Prob List, Meds, Allergies, SHx, FHx  :1}   Disposition:   Follow up with {EPPROVIDERS:28135} {EPFOLLOW UP:28173}  Signed, Graciella Freer, PA-C

## 2023-04-19 ENCOUNTER — Encounter: Payer: Self-pay | Admitting: Student

## 2023-04-19 ENCOUNTER — Ambulatory Visit: Payer: Medicare Other | Attending: Student | Admitting: Student

## 2023-04-19 VITALS — BP 130/62 | HR 72 | Ht 66.0 in | Wt 190.4 lb

## 2023-04-19 DIAGNOSIS — I493 Ventricular premature depolarization: Secondary | ICD-10-CM

## 2023-04-19 DIAGNOSIS — I480 Paroxysmal atrial fibrillation: Secondary | ICD-10-CM | POA: Diagnosis not present

## 2023-04-19 DIAGNOSIS — I10 Essential (primary) hypertension: Secondary | ICD-10-CM | POA: Diagnosis not present

## 2023-04-19 DIAGNOSIS — S065XAA Traumatic subdural hemorrhage with loss of consciousness status unknown, initial encounter: Secondary | ICD-10-CM | POA: Diagnosis not present

## 2023-04-19 LAB — CUP PACEART INCLINIC DEVICE CHECK
Battery Remaining Longevity: 117 mo
Battery Voltage: 3 V
Brady Statistic AP VP Percent: 2.57 %
Brady Statistic AP VS Percent: 59.06 %
Brady Statistic AS VP Percent: 1.45 %
Brady Statistic AS VS Percent: 36.92 %
Brady Statistic RA Percent Paced: 66.9 %
Brady Statistic RV Percent Paced: 4.02 %
Date Time Interrogation Session: 20250218084958
Implantable Lead Connection Status: 753985
Implantable Lead Connection Status: 753985
Implantable Lead Implant Date: 20200519
Implantable Lead Implant Date: 20200519
Implantable Lead Location: 753859
Implantable Lead Location: 753860
Implantable Lead Model: 5076
Implantable Lead Model: 5076
Implantable Pulse Generator Implant Date: 20200519
Lead Channel Impedance Value: 323 Ohm
Lead Channel Impedance Value: 418 Ohm
Lead Channel Impedance Value: 532 Ohm
Lead Channel Impedance Value: 570 Ohm
Lead Channel Pacing Threshold Amplitude: 0.625 V
Lead Channel Pacing Threshold Amplitude: 0.625 V
Lead Channel Pacing Threshold Pulse Width: 0.4 ms
Lead Channel Pacing Threshold Pulse Width: 0.4 ms
Lead Channel Sensing Intrinsic Amplitude: 4.125 mV
Lead Channel Sensing Intrinsic Amplitude: 4.125 mV
Lead Channel Sensing Intrinsic Amplitude: 4.375 mV
Lead Channel Sensing Intrinsic Amplitude: 4.375 mV
Lead Channel Setting Pacing Amplitude: 1.5 V
Lead Channel Setting Pacing Amplitude: 2.5 V
Lead Channel Setting Pacing Pulse Width: 0.4 ms
Lead Channel Setting Sensing Sensitivity: 2 mV
Zone Setting Status: 755011
Zone Setting Status: 755011

## 2023-04-19 NOTE — Patient Instructions (Signed)
Medication Instructions:  Your physician recommends that you continue on your current medications as directed. Please refer to the Current Medication list given to you today.  *If you need a refill on your cardiac medications before your next appointment, please call your pharmacy*  Lab Work: None ordered If you have labs (blood work) drawn today and your tests are completely normal, you will receive your results only by: MyChart Message (if you have MyChart) OR A paper copy in the mail If you have any lab test that is abnormal or we need to change your treatment, we will call you to review the results.  Follow-Up: At [facility], you and your health needs are our priority.  As part of our continuing mission to provide you with exceptional heart care, we have created designated Provider Care Teams.  These Care Teams include your primary Cardiologist (physician) and Advanced Practice Providers (APPs -  Physician Assistants and Nurse Practitioners) who all work together to provide you with the care you need, when you need it.  Your next appointment:   3 month(s)  Provider:   Michael "Andy" Tillery, PA-C  

## 2023-04-20 ENCOUNTER — Encounter: Payer: Self-pay | Admitting: Cardiology

## 2023-04-22 ENCOUNTER — Other Ambulatory Visit: Payer: Self-pay

## 2023-04-22 DIAGNOSIS — M810 Age-related osteoporosis without current pathological fracture: Secondary | ICD-10-CM

## 2023-04-22 MED ORDER — DENOSUMAB 60 MG/ML ~~LOC~~ SOSY
60.0000 mg | PREFILLED_SYRINGE | Freq: Once | SUBCUTANEOUS | Status: AC
  Administered 2023-09-01: 60 mg via SUBCUTANEOUS

## 2023-04-25 ENCOUNTER — Other Ambulatory Visit: Payer: Self-pay | Admitting: Internal Medicine

## 2023-04-25 DIAGNOSIS — E785 Hyperlipidemia, unspecified: Secondary | ICD-10-CM

## 2023-04-29 ENCOUNTER — Other Ambulatory Visit: Payer: Self-pay | Admitting: Internal Medicine

## 2023-04-29 DIAGNOSIS — E785 Hyperlipidemia, unspecified: Secondary | ICD-10-CM

## 2023-04-29 MED ORDER — SOLIFENACIN SUCCINATE 10 MG PO TABS: 10.0000 mg | ORAL_TABLET | Freq: Every day | ORAL | 1 refills | Status: DC

## 2023-04-29 MED ORDER — ROSUVASTATIN CALCIUM 10 MG PO TABS
10.0000 mg | ORAL_TABLET | Freq: Every day | ORAL | 1 refills | Status: DC
Start: 2023-04-29 — End: 2023-11-01

## 2023-04-29 NOTE — Telephone Encounter (Signed)
 Copied from CRM [id number]. Topic: Clinical - Medication Refill >> Apr 29, 2023  9:31 AM Orinda Kenner C wrote: Most Recent Primary Care Visit:  Provider: Particia Nearing  Department: [facility]  Visit Type: NURSE VISIT  Date: 02/24/2023  Medication: rosuvastatin (CRESTOR) 10 MG tablet and solifenacin (VESICARE) 10 MG tablet   Has the patient contacted their pharmacy? Yes (Agent: If no, request that the patient contact the pharmacy for the refill. If patient does not wish to contact the pharmacy document the reason why and proceed with request.) (Agent: If yes, when and what did the pharmacy advise?)  Is this the correct pharmacy for this prescription? Yes If no, delete pharmacy and type the correct one.  This is the patient's preferred pharmacy:  [facility] [address] [place], [place] - [address] [facility] [address] [place] [address] Phone: [phone] Fax: [phone]  Has the prescription been filled recently? No  Is the patient out of the medication? No, patient will run out of medications.  Has the patient been seen for an appointment in the last year OR does the patient have an upcoming appointment? Yes, upcoming appointment is 08/01/23  Can we respond through MyChart? No, please call at [phone]  Agent: Please be advised that Rx refills may take up to 3 business days. We ask that you follow-up with your pharmacy.

## 2023-04-29 NOTE — Telephone Encounter (Signed)
 Last Fill: Rosuvastatin: 01/20/23     Solifenacin: 01/20/23   Last OV: 01/31/23 Next OV: 08/01/23  Routing to provider for review/authorization.

## 2023-05-19 NOTE — Addendum Note (Signed)
 Addended by: Elease Etienne A on: 05/19/2023 09:38 AM   Modules accepted: Orders

## 2023-05-19 NOTE — Progress Notes (Signed)
 Remote pacemaker transmission.

## 2023-05-27 ENCOUNTER — Other Ambulatory Visit: Payer: Self-pay | Admitting: Internal Medicine

## 2023-05-27 DIAGNOSIS — E781 Pure hyperglyceridemia: Secondary | ICD-10-CM

## 2023-06-27 ENCOUNTER — Inpatient Hospital Stay: Payer: Medicare Other | Attending: Internal Medicine

## 2023-06-27 ENCOUNTER — Encounter (HOSPITAL_COMMUNITY): Payer: Self-pay

## 2023-06-27 ENCOUNTER — Ambulatory Visit (HOSPITAL_COMMUNITY)
Admission: RE | Admit: 2023-06-27 | Discharge: 2023-06-27 | Disposition: A | Discharge status: Home / Self Care | Source: Ambulatory Visit | Attending: Internal Medicine | Admitting: Internal Medicine

## 2023-06-27 DIAGNOSIS — C3490 Malignant neoplasm of unspecified part of unspecified bronchus or lung: Secondary | ICD-10-CM | POA: Diagnosis not present

## 2023-06-27 DIAGNOSIS — D693 Immune thrombocytopenic purpura: Secondary | ICD-10-CM | POA: Diagnosis not present

## 2023-06-27 DIAGNOSIS — Z85528 Personal history of other malignant neoplasm of kidney: Secondary | ICD-10-CM | POA: Insufficient documentation

## 2023-06-27 DIAGNOSIS — I7143 Infrarenal abdominal aortic aneurysm, without rupture: Secondary | ICD-10-CM | POA: Diagnosis not present

## 2023-06-27 DIAGNOSIS — R932 Abnormal findings on diagnostic imaging of liver and biliary tract: Secondary | ICD-10-CM | POA: Diagnosis not present

## 2023-06-27 DIAGNOSIS — N289 Disorder of kidney and ureter, unspecified: Secondary | ICD-10-CM | POA: Diagnosis not present

## 2023-06-27 DIAGNOSIS — Z905 Acquired absence of kidney: Secondary | ICD-10-CM | POA: Diagnosis not present

## 2023-06-27 DIAGNOSIS — J439 Emphysema, unspecified: Secondary | ICD-10-CM | POA: Diagnosis not present

## 2023-06-27 DIAGNOSIS — I700 Atherosclerosis of aorta: Secondary | ICD-10-CM | POA: Diagnosis not present

## 2023-06-27 LAB — CBC WITH DIFFERENTIAL (CANCER CENTER ONLY)
Abs Immature Granulocytes: 0.06 10*3/uL (ref 0.00–0.07)
Basophils Absolute: 0 10*3/uL (ref 0.0–0.1)
Basophils Relative: 1 %
Eosinophils Absolute: 0.2 10*3/uL (ref 0.0–0.5)
Eosinophils Relative: 3 %
HCT: 39.3 % (ref 39.0–52.0)
Hemoglobin: 14.1 g/dL (ref 13.0–17.0)
Immature Granulocytes: 1 %
Lymphocytes Relative: 16 %
Lymphs Abs: 1 10*3/uL (ref 0.7–4.0)
MCH: 33.4 pg (ref 26.0–34.0)
MCHC: 35.9 g/dL (ref 30.0–36.0)
MCV: 93.1 fL (ref 80.0–100.0)
Monocytes Absolute: 0.5 10*3/uL (ref 0.1–1.0)
Monocytes Relative: 8 %
Neutro Abs: 4.4 10*3/uL (ref 1.7–7.7)
Neutrophils Relative %: 71 %
Platelet Count: 103 10*3/uL — ABNORMAL LOW (ref 150–400)
RBC: 4.22 MIL/uL (ref 4.22–5.81)
RDW: 13.6 % (ref 11.5–15.5)
WBC Count: 6.1 10*3/uL (ref 4.0–10.5)
nRBC: 0 % (ref 0.0–0.2)

## 2023-06-27 LAB — CMP (CANCER CENTER ONLY)
ALT: 15 U/L (ref 0–44)
AST: 20 U/L (ref 15–41)
Albumin: 4.5 g/dL (ref 3.5–5.0)
Alkaline Phosphatase: 37 U/L — ABNORMAL LOW (ref 38–126)
Anion gap: 4 — ABNORMAL LOW (ref 5–15)
BUN: 14 mg/dL (ref 8–23)
CO2: 27 mmol/L (ref 22–32)
Calcium: 9.1 mg/dL (ref 8.9–10.3)
Chloride: 101 mmol/L (ref 98–111)
Creatinine: 1.01 mg/dL (ref 0.61–1.24)
GFR, Estimated: >60 mL/min (ref >60)
Glucose, Bld: 112 mg/dL — ABNORMAL HIGH (ref 70–99)
Potassium: 4.3 mmol/L (ref 3.5–5.1)
Sodium: 132 mmol/L — ABNORMAL LOW (ref 135–145)
Total Bilirubin: 0.8 mg/dL (ref 0.0–1.2)
Total Protein: 7.3 g/dL (ref 6.5–8.1)

## 2023-06-27 LAB — LACTATE DEHYDROGENASE: LDH: 163 U/L (ref 98–192)

## 2023-07-04 ENCOUNTER — Inpatient Hospital Stay: Payer: Medicare Other | Attending: Internal Medicine | Admitting: Internal Medicine

## 2023-07-04 VITALS — BP 111/55 | HR 80 | Temp 97.6°F | Resp 17 | Ht 65.0 in | Wt 191.0 lb

## 2023-07-04 DIAGNOSIS — Z905 Acquired absence of kidney: Secondary | ICD-10-CM | POA: Diagnosis not present

## 2023-07-04 DIAGNOSIS — C3490 Malignant neoplasm of unspecified part of unspecified bronchus or lung: Secondary | ICD-10-CM | POA: Diagnosis not present

## 2023-07-04 DIAGNOSIS — D693 Immune thrombocytopenic purpura: Secondary | ICD-10-CM | POA: Insufficient documentation

## 2023-07-04 DIAGNOSIS — Z85528 Personal history of other malignant neoplasm of kidney: Secondary | ICD-10-CM | POA: Insufficient documentation

## 2023-07-04 NOTE — Progress Notes (Signed)
 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  He initially presented with localized disease in 2020.  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  He presented with a platelet count of less than 5 and subdural hematoma.  He had a complete response to IVIG and dexamethasone .    PRIOR THERAPY: 1) Status post left radical nephrectomy completed by Dr. Parke Boll on October 18, 2018.  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  2) Ipilimumab  1 mg/kg and nivolumab  3 mg/kg started on October 01, 2021.  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   CURRENT THERAPY: Observation.  INTERVAL HISTORY: [name] 83 y.o. male returns to the clinic today for follow-up visit accompanied by his wife. Discussed the use of AI scribe software for clinical note transcription with the patient, who gave verbal consent to proceed.  History of Present Illness   [name] "[name]" is an 83 year old male with stage four clear cell renal cell carcinoma who presents for evaluation with repeat CT scan for restaging of his disease. He is accompanied by his wife.  He was diagnosed with stage four clear cell renal cell carcinoma in December 2023, with initial localized disease identified in 2020. He underwent a left radical nephrectomy followed by two cycles of immunotherapy with ipilimumab  and nivolumab , which were discontinued due to mucositis and pharyngitis. He has been on observation since then.  A recent CT scan of the chest, abdomen, and pelvis showed slight enlargement of lung nodules compared to previous imaging. No chest pain, hemoptysis, nausea,  vomiting, diarrhea, headaches, or changes in vision. He experiences sneezing and nasal congestion, which he attributes to seasonal pollen.  He has a history of immune thrombocytopenic purpura (ITP).  He is able to perform activities such as mowing and weed eating, and takes naps in the afternoon if needed.      MEDICAL HISTORY: Past Medical History:  Diagnosis Date   Abdominal aortic aneurysm (AAA) without rupture 06/23/2018   Age-related osteoporosis without current pathological fracture 07/14/2021   BPH (benign prostatic hyperplasia) 01/19/2010   Cervical radiculopathy 06/16/2018   Chronic anticoagulation 08/22/2018   Chronic bilateral low back pain without sciatica 07/18/2019   Chronic sinusitis 05/18/2021   Diverticulosis    Erectile dysfunction due to arterial insufficiency 04/23/2015   Esophageal stricture    Essential hypertension 05/01/2018   Estimated Creatinine Clearance: 44.1 mL/min (by C-G formula based on SCr of 1.35 mg/dL).   GERD (gastroesophageal reflux disease) 11/10/2010   Hearing loss    Hiatal hernia    Hyperlipidemia with target LDL less than 70 11/28/2012   The 10-year ASCVD risk score Risa Cheney DC Jr., et al., 2013) is: 30.7%   Values used to calculate the score:     Age: 29 years     Sex: Male     Is Non-Hispanic African American: No     Diabetic: No     Tobacco smoker: No     Systolic Blood Pressure: 132 mmHg     Is BP treated: No     HDL Cholesterol: 30.8 mg/dL     Total Cholesterol: 183 mg/dL   Hypoglycemia  Iron deficiency anemia due to dietary causes 12/07/2018   Lung nodule seen on imaging study 06/23/2018   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 08/22/2018    a Medtronic Azure XT MRI conditional dual-chamber pacemaker for symptomatic sinus pauses, sick sinus syndrome, and syncope   Migraine headache    OAB (overactive bladder) 01/06/2016   PAF (paroxysmal atrial fibrillation)    Echo with normal LV function and mild LVH    Prediabetes 12/06/2018   Pure hyperglyceridemia 11/22/2007   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   Estimated Creatinine Clearance: 51.7 mL/min (by C-G formula based on SCr of 1.15 mg/dL).   Syncope    presented with atrial fib converted with Diltiazem    Systemic inflammatory response syndrome 06/26/2018   Thrombocytopenia 01/19/2021    ALLERGIES:  is allergic to hydrocodone , monosodium glutamate, naproxen  sodium, and other.  MEDICATIONS:  Current Outpatient Medications  Medication Sig Dispense Refill   acetaminophen  (TYLENOL ) 500 MG tablet Take 1,000 mg by mouth every 6 (six) hours as needed for mild pain.     Calcium  Carbonate (CALCIUM  500 PO) Take 500 mg by mouth 2 (two) times daily.     denosumab  (PROLIA ) 60 MG/ML SOSY injection Inject 60 mg into the skin every 6 (six) months.     esomeprazole  (NEXIUM ) 40 MG capsule Take 1 capsule (40 mg total) by mouth daily before breakfast. 90 capsule 3   metoprolol  succinate (TOPROL  XL) 25 MG 24 hr tablet Take 2 tablets (50 mg total) by mouth at bedtime. 90 tablet 3   Multiple Vitamins-Minerals (CENTRUM MULTI + OMEGA 3 PO) Take 1 tablet by mouth daily.      omega-3 acid ethyl esters (LOVAZA ) 1 g capsule Take 2 capsules by mouth twice daily 360 capsule 0   rosuvastatin  (CRESTOR ) 10 MG tablet Take 1 tablet (10 mg total) by mouth daily. 90 tablet 1   solifenacin  (VESICARE ) 10 MG tablet Take 1 tablet (10 mg total) by mouth at bedtime. 90 tablet 1   VITAMIN D  PO Take 1,000 Units by mouth daily.     Current Facility-Administered Medications  Medication Dose Route Frequency Provider Last Rate Last Admin   [START ON 08/10/2023] denosumab  (PROLIA ) injection 60 mg  60 mg Subcutaneous Once Zorita Hiss, NP        SURGICAL  HISTORY:  Past Surgical History:  Procedure Laterality Date   CYSTOSCOPY WITH URETEROSCOPY AND STENT PLACEMENT Left 08/16/2018   Procedure: CYSTOSCOPY WITH LEFT RETROGRADE PYELOGRAM, BALLOON DILATION LEFT URETER, STENT PLACEMENT;  Surgeon: Samson Croak, MD;  Location: [facility];  Service: Urology;  Laterality: Left;   HERNIA REPAIR     INSERT / REPLACE / REMOVE PACEMAKER     LAPAROSCOPIC ASSISTED VENTRAL HERNIA REPAIR N/A 10/18/2018   Procedure: LAPAROSCOPIC ASSISTED VENTRAL WALL HERNIA REPAIR WITH MESH;  Surgeon: Candyce Champagne, MD;  Location: [facility];  Service: General;  Laterality: N/A;   LAPAROSCOPIC NEPHRECTOMY, HAND ASSISTED Left 10/18/2018   Procedure: LEFT HAND ASSISTED LAPAROSCOPIC RADICAL NEPHRECTOMY WITH ADRENALECTOMY;  Surgeon: Samson Croak, MD;  Location: [facility];  Service: Urology;  Laterality: Left;   LOOP RECORDER INSERTION N/A 04/26/2018   Procedure: LOOP RECORDER INSERTION;  Surgeon: Jolly Needle, MD;  Location: [facility];  Service: Cardiovascular;  Laterality: N/A;   LOOP RECORDER REMOVAL N/A 07/18/2018   Procedure: LOOP RECORDER REMOVAL;  Surgeon: Jolly Needle, MD;  Location: [facility];  Service: Cardiovascular;  Laterality: N/A;   NASAL SEPTUM SURGERY     PACEMAKER IMPLANT N/A 07/18/2018   Procedure: PACEMAKER IMPLANT;  Surgeon: Jolly Needle, MD;  Location: [facility];  Service: Cardiovascular;  Laterality: N/A;   SKIN BIOPSY     TONSILLECTOMY     TONSILLECTOMY     UMBILICAL HERNIA REPAIR  1993   WISDOM TOOTH EXTRACTION     about 83 years old    REVIEW OF SYSTEMS:  Constitutional: positive for fatigue Eyes: negative Ears, nose, mouth, throat, and face: positive for dry mouth Respiratory: negative Cardiovascular: negative Gastrointestinal: negative Genitourinary:negative Integument/breast: negative Hematologic/lymphatic: negative Musculoskeletal:negative Neurological: negative Behavioral/Psych: negative Endocrine:  negative Allergic/Immunologic: negative   PHYSICAL EXAMINATION: General appearance: alert, cooperative, and fatigued Head: Normocephalic, without obvious abnormality, atraumatic Neck: no adenopathy, no JVD, supple, symmetrical, trachea midline, and thyroid  not enlarged, symmetric, no tenderness/mass/nodules Lymph nodes: Cervical, supraclavicular, and axillary nodes normal. Resp: clear to auscultation bilaterally and normal percussion bilaterally Back: symmetric, no curvature. ROM normal. No CVA tenderness. Cardio: regular rate and rhythm, S1, S2 normal, no murmur, click, rub or gallop GI: soft, non-tender; bowel sounds normal; no masses,  no organomegaly Extremities: extremities normal, atraumatic, no cyanosis or edema Neurologic: Alert and oriented X 3, normal strength and tone. Normal symmetric reflexes. Normal coordination and gait  ECOG PERFORMANCE STATUS: 1 - Symptomatic but completely ambulatory  Blood pressure (!) 111/55, pulse 80, temperature 97.6 F (36.4 C), temperature source Temporal, resp. rate 17, height 5\' 5"  (1.651 m), weight 191 lb (86.6 kg), SpO2 100%.  LABORATORY DATA: Lab Results  Component Value Date   WBC 6.1 06/27/2023   HGB 14.1 06/27/2023   HCT 39.3 06/27/2023   MCV 93.1 06/27/2023   PLT 103 (L) 06/27/2023      Chemistry      Component Value Date/Time   NA 132 (L) 06/27/2023 1131   NA 135 07/21/2020 1211   K 4.3 06/27/2023 1131   K 3.8 11/09/2013 0000   CL 101 06/27/2023 1131   CL 105 11/09/2013 0000   CO2 27 06/27/2023 1131   CO2 26 11/09/2013 0000   BUN 14 06/27/2023 1131   BUN 18 07/21/2020 1211   CREATININE 1.01 06/27/2023 1131   CREATININE 0.66 11/09/2013 0000      Component Value Date/Time   CALCIUM  9.1 06/27/2023 1131   CALCIUM  8.7 11/09/2013 0000   ALKPHOS 37 (L) 06/27/2023 1131   ALKPHOS 55 11/09/2013 0000   AST 20 06/27/2023 1131   ALT 15 06/27/2023 1131   BILITOT 0.8 06/27/2023 1131       RADIOGRAPHIC STUDIES: CT Chest Wo  Contrast Result Date: 07/02/2023 CLINICAL DATA:  Restaging non-small cell lung cancer. * Tracking Code: BO *. EXAM: CT CHEST WITHOUT CONTRAST TECHNIQUE: Multidetector CT imaging of the chest was performed following the standard protocol without IV contrast. RADIATION DOSE REDUCTION: This exam was performed according to the departmental dose-optimization program which includes automated exposure control, adjustment of the mA and/or kV according to patient size and/or use of iterative reconstruction technique. COMPARISON:  12/28/2022 FINDINGS: Cardiovascular: There is a left chest wall pacer with leads in the right atrial appendage and right ventricle. Mild cardiac enlargement. No pericardial effusion. Aortic atherosclerosis and multi vessel coronary artery calcification. Mediastinum/Nodes: Thyroid  gland, trachea and esophagus are normal. Small  hiatal hernia noted. No mediastinal or axillary adenopathy. Lungs/Pleura: No pleural effusion. Emphysema with diffuse bronchial wall thickening. Bilateral peripheral and lower lung zone predominant subpleural reticulation with mild traction bronchiectasis. Multiple pulmonary nodules are identified within both lungs compatible with metastatic disease. Index subpleural nodule overlying the anterior right upper lobe measures 8 mm, image 79/6. Previously 6 mm. Central left upper lobe lung nodule measures 8 mm, image 74/6. Previously 6 mm. Right middle lobe lung nodule measures 1.4 cm, image 115/6. Previously 1.1 cm. Right lung base nodule measures 0.9 cm, image 122/6. Previously 0.7 cm. No new lung nodules identified. Upper Abdomen: No acute abnormality within the imaged portions of the upper abdomen. Please refer to CT abdomen report from 06/27/2023. Musculoskeletal: Unchanged chronic severe compression fractures involving T12 and L1. No acute or suspicious osseous findings. IMPRESSION: 1. Mild interval increase in size of multiple pulmonary nodules compatible with metastatic  disease. 2. No new lung nodules identified. 3. Bilateral peripheral and lower lung zone predominant subpleural reticulation with mild traction bronchiectasis. 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 4. Coronary artery calcifications. 5. Aortic Atherosclerosis (ICD10-I70.0) and Emphysema (ICD10-J43.9). Electronically Signed   By: Kimberley Penman M.D.   On: 07/02/2023 12:47   CT ABDOMEN PELVIS WO CONTRAST Result Date: 06/30/2023 EXAMINATION: CT ABDOMEN PELVIS WO CONTRAST CLINICAL INDICATION: Male, 83 years old. Non-small cell lung cancer (NSCLC), staging TECHNIQUE: Helical CT scan examination of the abdomen and pelvis is performed from the domes of the diaphragm to the pubic symphysis. Limited evaluation of the solid organs due to lack of intravenous contrast. Unless otherwise specified, incidental thyroid , adrenal, renal lesions do not require dedicated imaging follow up. Additionally, any mentioned pulmonary nodules do not require dedicated imaging follow-up based on the Fleischner guidelines unless otherwise specified. Coronary calcifications are not identified unless otherwise specified. COMPARISON: 12/28/2022 FINDINGS: Regarding findings of the lung bases, please refer to the chest report. The liver contains a 2 subcentimeter probable cysts. The gallbladder is normal. Peripheral calcification along the spleen may be from prior trauma. The pancreas demonstrates fatty infiltration. The right adrenal is normal. The left adrenal may be surgically absent or atrophic. The right kidney is noted to contain a small hyperdense lesion possibly representing a hemorrhagic cyst. The left kidney is surgically absent. There is a 3.4 cm infrarenal abdominal aortic aneurysm. Scattered calcified atherosclerotic changes are present in the bladder is normal. The prostate is normal. There is colonic diverticulosis. The appendix is normal. Large and small bowel loops are otherwise within normal limits. There is no free  fluid or pathologic lymphadenopathy by size criteria. No concerning osseous lesions are identified. There is diffuse osseous demineralization. There are old compression fractures noted at T12 and L1. There are degenerative changes of the spine. IMPRESSION: No convincing evidence for metastatic disease within the abdomen or pelvis. 3.4 cm infrarenal abdominal aortic aneurysm. Recommend follow-up in 3 years. DOSE REDUCTION: This exam was performed according to our departmental dose-optimization program which includes automated exposure control, adjustment of the mA and/or kV according to patient size and/or use of iterative reconstruction technique. 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  He initially presented with localized disease in 2020.  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  He presented with a platelet  count of less than 5 and subdural hematoma.  He had a complete response to IVIG and dexamethasone .  He is status post left radical nephrectomy completed by Dr. Parke Boll on October 18, 2018.  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  The patient was then treated with Ipilimumab  1 mg/kg and nivolumab  3 mg/kg started on October 01, 2021.  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 The patient is currently on observation and he is feeling fine with no concerning complaints. He had repeat CT scan of the chest, abdomen and pelvis performed recently.  I personally and independently reviewed the scan and discussed the result with the patient and his wife.  His scan showed slight increase in some of the pulmonary nodules but no convincing evidence of  metastatic disease within the abdomen or pelvis.    Stage 4 clear cell renal cell carcinoma Stage 4 clear cell renal cell carcinoma with slight increase in size of lung nodules on recent CT scan. Previously discontinued immunotherapy due to mucositis and pharyngitis. Current symptoms include sneezing and nasal congestion, likely due to seasonal allergies. No chest pain, hemoptysis, nausea, vomiting, diarrhea, headaches, or visual changes. Decision made to continue observation as the nodules have only slightly increased in size and are not causing significant symptoms. Discussed potential options including resuming immunotherapy or starting oral medication, both of which have significant side effects. He and his wife prefer to continue observation at this time. - Continue observation of lung nodules - Schedule follow-up CT scan in 6 months - Re-evaluate treatment options if nodules significantly increase in size  Immune thrombocytopenic purpura (ITP) Immune thrombocytopenic purpura (ITP) with no current symptoms or issues.   The patient was advised to call immediately if he has any concerning symptoms in the interval. The patient voices understanding of current disease status and treatment options and is in agreement with the current care plan.  All questions were answered. The patient knows to call the clinic with any problems, questions or concerns. We can certainly see the patient much sooner if necessary.  The total time spent in the appointment was 30 minutes.  Disclaimer: This note was dictated with voice recognition software. Similar sounding words can inadvertently be transcribed and may not be corrected upon review.

## 2023-07-11 ENCOUNTER — Telehealth: Payer: Self-pay

## 2023-07-11 ENCOUNTER — Other Ambulatory Visit (HOSPITAL_COMMUNITY): Payer: Self-pay

## 2023-07-11 NOTE — Telephone Encounter (Signed)
 Frank [name]

## 2023-07-11 NOTE — Telephone Encounter (Signed)
 Prolia  VOB initiated via [email or web address]  Next Prolia  inj DUE: 08/24/23

## 2023-07-11 NOTE — Telephone Encounter (Signed)
 Pt ready for scheduling for PROLIA  on or after : 08/24/23  Option# 1: Buy/Bill (Office supplied medication)  Out-of-pocket cost due at time of clinic visit: $352  Number of injection/visits approved: 2  Primary: [employer] MEDICARE Prolia  co-insurance: 20% Admin fee co-insurance: $20  Secondary: --- Prolia  co-insurance:  Admin fee co-insurance:   Medical Benefit Details: Date Benefits were checked: 07/11/23 Deductible: NO/ Coinsurance: 20%/ Admin Fee: $20  Prior Auth: APPROVED PA# [id number] Expiration Date: 08/24/23-08/23/24  # of doses approved: 2 ----------------------------------------------------------------------- Option# 2- Med Obtained from pharmacy:  Pharmacy benefit: Copay $550 (Paid to pharmacy) Admin Fee: $20 (Pay at clinic)  Prior Auth: N/A PA# Expiration Date:   # of doses approved:   If patient wants fill through the pharmacy benefit please send prescription to: [facility], and include estimated need by date in rx notes. Pharmacy will ship medication directly to the office.  Patient NOT eligible for Prolia  Copay Card. Copay Card can make patient's cost as little as $25. Link to apply: [email or web address]  ** This summary of benefits is an estimation of the patient's out-of-pocket cost. Exact cost may very based on individual plan coverage.

## 2023-07-13 ENCOUNTER — Ambulatory Visit (INDEPENDENT_AMBULATORY_CARE_PROVIDER_SITE_OTHER): Payer: Medicare Other

## 2023-07-13 DIAGNOSIS — I495 Sick sinus syndrome: Secondary | ICD-10-CM | POA: Diagnosis not present

## 2023-07-13 LAB — CUP PACEART REMOTE DEVICE CHECK
Battery Remaining Longevity: 112 mo
Battery Voltage: 2.99 V
Brady Statistic AP VP Percent: 7.39 %
Brady Statistic AP VS Percent: 90.68 %
Brady Statistic AS VP Percent: 0.22 %
Brady Statistic AS VS Percent: 1.7 %
Brady Statistic RA Percent Paced: 98.54 %
Brady Statistic RV Percent Paced: 7.61 %
Date Time Interrogation Session: 20250513231304
Implantable Lead Connection Status: 753985
Implantable Lead Connection Status: 753985
Implantable Lead Implant Date: 20200519
Implantable Lead Implant Date: 20200519
Implantable Lead Location: 753859
Implantable Lead Location: 753860
Implantable Lead Model: 5076
Implantable Lead Model: 5076
Implantable Pulse Generator Implant Date: 20200519
Lead Channel Impedance Value: 323 Ohm
Lead Channel Impedance Value: 399 Ohm
Lead Channel Impedance Value: 532 Ohm
Lead Channel Impedance Value: 551 Ohm
Lead Channel Pacing Threshold Amplitude: 0.5 V
Lead Channel Pacing Threshold Amplitude: 0.5 V
Lead Channel Pacing Threshold Pulse Width: 0.4 ms
Lead Channel Pacing Threshold Pulse Width: 0.4 ms
Lead Channel Sensing Intrinsic Amplitude: 5 mV
Lead Channel Sensing Intrinsic Amplitude: 5 mV
Lead Channel Sensing Intrinsic Amplitude: 5.25 mV
Lead Channel Sensing Intrinsic Amplitude: 5.25 mV
Lead Channel Setting Pacing Amplitude: 1.5 V
Lead Channel Setting Pacing Amplitude: 2.5 V
Lead Channel Setting Pacing Pulse Width: 0.4 ms
Lead Channel Setting Sensing Sensitivity: 2 mV
Zone Setting Status: 755011
Zone Setting Status: 755011

## 2023-07-14 NOTE — Progress Notes (Unsigned)
  Electrophysiology Office Note:   ID:  Frank Caulder., DOB 05-25-1940, MRN [id number]  Primary Cardiologist: Ahmad Alert, MD Electrophysiologist: Boyce Byes, MD  {Click to update primary MD,subspecialty MD or APP then REFRESH:1}    History of Present Illness:   Frank Syzdek. is a 83 y.o. male with h/o SSS s/p Medtronic PPM, PAF, and HLD seen today for routine electrophysiology followup.   Since last being seen in our clinic the patient reports doing ***.  he denies chest pain, palpitations, dyspnea, PND, orthopnea, nausea, vomiting, dizziness, syncope, edema, weight gain, or early satiety.   Review of systems complete and found to be negative unless listed in HPI.   EP Information / Studies Reviewed:    EKG is ordered today. Personal review as below.       PPM Interrogation-  reviewed in detail today,  See PACEART report.  Arrhythmia/Device History 07/18/2018 MDT dual chamber PPM  (loop removed)    Physical Exam:   VS:  There were no vitals taken for this visit.   Wt Readings from Last 3 Encounters:  07/04/23 191 lb (86.6 kg)  04/19/23 190 lb 6.4 oz (86.4 kg)  04/04/23 190 lb 8 oz (86.4 kg)     GEN: No acute distress  NECK: No JVD; No carotid bruits CARDIAC: {EPRHYTHM:28826}, no murmurs, rubs, gallops RESPIRATORY:  Clear to auscultation without rales, wheezing or rhonchi  ABDOMEN: Soft, non-tender, non-distended EXTREMITIES:  {EDEMA LEVEL:28147::"No"} edema; No deformity   ASSESSMENT AND PLAN:    SND s/p Medtronic PPM  Normal PPM function See Pace Art report No changes today  PVCs Continue toprol  50 m daily *** burden  PAF *** burden Not on eliquis  with h/o SDH  {Click here to Review PMH, Prob List, Meds, Allergies, SHx, FHx  :1}   Disposition:   Follow up with {EPPROVIDERS:28135} {EPFOLLOW UP:28173}  Signed, Tylene Galla, PA-C

## 2023-07-15 ENCOUNTER — Ambulatory Visit: Payer: Medicare Other | Attending: Student | Admitting: Student

## 2023-07-15 ENCOUNTER — Encounter: Payer: Self-pay | Admitting: Student

## 2023-07-15 VITALS — BP 110/58 | HR 80 | Ht 65.0 in | Wt 189.0 lb

## 2023-07-15 DIAGNOSIS — I495 Sick sinus syndrome: Secondary | ICD-10-CM | POA: Diagnosis not present

## 2023-07-15 DIAGNOSIS — I480 Paroxysmal atrial fibrillation: Secondary | ICD-10-CM

## 2023-07-15 DIAGNOSIS — I493 Ventricular premature depolarization: Secondary | ICD-10-CM

## 2023-07-15 LAB — CUP PACEART INCLINIC DEVICE CHECK
Battery Remaining Longevity: 111 mo
Battery Voltage: 2.99 V
Brady Statistic AP VP Percent: 8.73 %
Brady Statistic AP VS Percent: 89.24 %
Brady Statistic AS VP Percent: 0.25 %
Brady Statistic AS VS Percent: 1.79 %
Brady Statistic RA Percent Paced: 98.4 %
Brady Statistic RV Percent Paced: 8.97 %
Date Time Interrogation Session: 20250516102318
Implantable Lead Connection Status: 753985
Implantable Lead Connection Status: 753985
Implantable Lead Implant Date: 20200519
Implantable Lead Implant Date: 20200519
Implantable Lead Location: 753859
Implantable Lead Location: 753860
Implantable Lead Model: 5076
Implantable Lead Model: 5076
Implantable Pulse Generator Implant Date: 20200519
Lead Channel Impedance Value: 304 Ohm
Lead Channel Impedance Value: 380 Ohm
Lead Channel Impedance Value: 532 Ohm
Lead Channel Impedance Value: 570 Ohm
Lead Channel Pacing Threshold Amplitude: 0.5 V
Lead Channel Pacing Threshold Amplitude: 0.625 V
Lead Channel Pacing Threshold Pulse Width: 0.4 ms
Lead Channel Pacing Threshold Pulse Width: 0.4 ms
Lead Channel Sensing Intrinsic Amplitude: 3.75 mV
Lead Channel Sensing Intrinsic Amplitude: 4.625 mV
Lead Channel Sensing Intrinsic Amplitude: 5 mV
Lead Channel Sensing Intrinsic Amplitude: 5 mV
Lead Channel Setting Pacing Amplitude: 1.5 V
Lead Channel Setting Pacing Amplitude: 2.5 V
Lead Channel Setting Pacing Pulse Width: 0.4 ms
Lead Channel Setting Sensing Sensitivity: 2 mV
Zone Setting Status: 755011
Zone Setting Status: 755011

## 2023-07-15 NOTE — Patient Instructions (Signed)
 Medication Instructions:  No medications changes today   *If you need a refill on your cardiac medications before your next appointment, please call your pharmacy*  Lab Work: No lab work today If you have labs (blood work) drawn today and your tests are completely normal, you will receive your results only by: MyChart Message (if you have MyChart) OR A paper copy in the mail If you have any lab test that is abnormal or we need to change your treatment, we will call you to review the results.  Testing/Procedures: No testing/procedures were scheduled today  Follow-Up: At [facility], you and your health needs are our priority.  As part of our continuing mission to provide you with exceptional heart care, our providers are all part of one team.  This team includes your primary Cardiologist (physician) and Advanced Practice Providers or APPs (Physician Assistants and Nurse Practitioners) who all work together to provide you with the care you need, when you need it.  Your next appointment:   6 month(s)  Provider:   You may see Boyce Byes, MD or one of the following Advanced Practice Providers on your designated Care Team:   Mertha Abrahams, [place] Bambi Lever "Jonelle Neri" [place], PA-C Suzann Riddle, NP Creighton Doffing, NP    We recommend signing up for the patient portal called "MyChart".  Sign up information is provided on this After Visit Summary.  MyChart is used to connect with patients for Virtual Visits (Telemedicine).  Patients are able to view lab/test results, encounter notes, upcoming appointments, etc.  Non-urgent messages can be sent to your provider as well.   To learn more about what you can do with MyChart, go to [email or web address].

## 2023-07-17 ENCOUNTER — Ambulatory Visit: Payer: Self-pay | Admitting: Cardiology

## 2023-07-29 ENCOUNTER — Ambulatory Visit

## 2023-07-29 VITALS — BP 118/80 | HR 82 | Ht 64.0 in | Wt 188.8 lb

## 2023-07-29 DIAGNOSIS — Z0000 Encounter for general adult medical examination without abnormal findings: Secondary | ICD-10-CM

## 2023-07-29 NOTE — Patient Instructions (Signed)
 Mr. Marinos , Thank you for taking time out of your busy schedule to complete your Annual Wellness Visit with me. I enjoyed our conversation and look forward to speaking with you again next year. I, as well as your care team,  appreciate your ongoing commitment to your health goals. Please review the following plan we discussed and let me know if I can assist you in the future. Your Game plan/ To Do List   Follow up Visits: Next Medicare AWV with our clinical staff: 07/31/2024.   Have you seen your provider in the last 6 months (3 months if uncontrolled diabetes)? Yes Next Office Visit with your provider: 08/01/2023.  Clinician Recommendations:  Aim for 30 minutes of exercise or brisk walking, 6-8 glasses of water, and 5 servings of fruits and vegetables each day. Keep up the work.      This is a list of the screening recommended for you and due dates:  Health Maintenance  Topic Date Due   Medicare Annual Wellness Visit  04/15/2022   Colon Cancer Screening  01/31/2024*   Flu Shot  09/30/2023   DTaP/Tdap/Td vaccine (3 - Td or Tdap) 04/07/2026   Pneumonia Vaccine  Completed   Zoster (Shingles) Vaccine  Completed   HPV Vaccine  Aged Out   Meningitis B Vaccine  Aged Out   COVID-19 Vaccine  Discontinued   Hepatitis C Screening  Discontinued  *Topic was postponed. The date shown is not the original due date.    Advanced directives: (Copy Requested) Please bring a copy of your health care power of attorney and living will to the office to be added to your chart at your convenience. You can mail to [facility] [address] [place], [place] [address] or email to [email or web address] Advance Care Planning is important because it:  [x]  Makes sure you receive the medical care that is consistent with your values, goals, and preferences  [x]  It provides guidance to your family and loved ones and reduces their decisional burden about whether or not they are making the right  decisions based on your wishes.  Follow the link provided in your after visit summary or read over the paperwork we have mailed to you to help you started getting your Advance Directives in place. If you need assistance in completing these, please reach out to us  so that we can help you!  See attachments for Preventive Care and Fall Prevention Tips.

## 2023-07-29 NOTE — Progress Notes (Signed)
 Subjective:   [name] is a 83 y.o. who presents for a Medicare Wellness preventive visit.  As a reminder, Annual Wellness Visits don't include a physical exam, and some assessments may be limited, especially if this visit is performed virtually. We may recommend an in-person follow-up visit with your provider if needed.  Visit Complete: In person  Persons Participating in Visit: Patient.  AWV Questionnaire: Yes: Patient Medicare AWV questionnaire was completed by the patient on 07/25/2023; I have confirmed that all information answered by patient is correct and no changes since this date.  Cardiac Risk Factors include: advanced age (>31men, >71 women);male gender;Other (see comment);dyslipidemia, Risk factor comments: BPH, CKD stage 3, PAF     Objective:     Today's Vitals   07/29/23 1401  Weight: 188 lb 12.8 oz (85.6 kg)  Height: 5\' 4"  (1.626 m)   Body mass index is 32.41 kg/m.     07/29/2023    2:08 PM 02/06/2022   10:05 AM 04/15/2021   11:49 AM 02/15/2020    4:19 PM 01/15/2019    8:33 AM 10/18/2018    6:49 AM 10/11/2018   10:48 AM  Advanced Directives  Does Patient Have a Medical Advance Directive? Yes No Yes Yes Yes Yes Yes  Type of Estate agent of [place];Living will  Healthcare Power of [employer] of [place];Living will Healthcare Power of [place];Living will Healthcare Power of [place];Living will  Does patient want to make changes to medical advance directive?    No - Patient declined  No - Patient declined No - Patient declined  Copy of Healthcare Power of Attorney in Chart? No - copy requested  No - copy requested  No - copy requested No - copy requested No - copy requested  Would patient like information on creating a medical advance directive?  No - Patient declined         Current Medications (verified) Outpatient Encounter Medications as of 07/29/2023  Medication Sig   acetaminophen  (TYLENOL ) 500 MG tablet  Take 1,000 mg by mouth every 6 (six) hours as needed for mild pain.   Calcium  Carbonate (CALCIUM  500 PO) Take 500 mg by mouth 2 (two) times daily.   denosumab  (PROLIA ) 60 MG/ML SOSY injection Inject 60 mg into the skin every 6 (six) months.   esomeprazole  (NEXIUM ) 40 MG capsule Take 1 capsule (40 mg total) by mouth daily before breakfast.   metoprolol  succinate (TOPROL  XL) 25 MG 24 hr tablet Take 2 tablets (50 mg total) by mouth at bedtime.   Multiple Vitamins-Minerals (CENTRUM MULTI + OMEGA 3 PO) Take 1 tablet by mouth daily.    omega-3 acid ethyl esters (LOVAZA ) 1 g capsule Take 2 capsules by mouth twice daily   rosuvastatin  (CRESTOR ) 10 MG tablet Take 1 tablet (10 mg total) by mouth daily.   solifenacin  (VESICARE ) 10 MG tablet Take 1 tablet (10 mg total) by mouth at bedtime.   VITAMIN D  PO Take 1,000 Units by mouth daily.   Facility-Administered Encounter Medications as of 07/29/2023  Medication   [START ON 08/10/2023] denosumab  (PROLIA ) injection 60 mg    Allergies (verified) Hydrocodone , Monosodium glutamate, Naproxen  sodium, and Other   History: Past Medical History:  Diagnosis Date   Abdominal aortic aneurysm (AAA) without rupture 06/23/2018   Age-related osteoporosis without current pathological fracture 07/14/2021   BPH (benign prostatic hyperplasia) 01/19/2010   Cervical radiculopathy 06/16/2018   Chronic anticoagulation 08/22/2018   Chronic bilateral low back pain  without sciatica 07/18/2019   Chronic sinusitis 05/18/2021   Diverticulosis    Erectile dysfunction due to arterial insufficiency 04/23/2015   Esophageal stricture    Essential hypertension 05/01/2018   Estimated Creatinine Clearance: 44.1 mL/min (by C-G formula based on SCr of 1.35 mg/dL).   GERD (gastroesophageal reflux disease) 11/10/2010   Hearing loss    Hiatal hernia    Hyperlipidemia with target LDL less than 70 11/28/2012   The 10-year ASCVD risk score Risa Cheney DC Jr., et al., 2013) is: 30.7%   Values  used to calculate the score:     Age: 66 years     Sex: Male     Is Non-Hispanic African American: No     Diabetic: No     Tobacco smoker: No     Systolic Blood Pressure: 132 mmHg     Is BP treated: No     HDL Cholesterol: 30.8 mg/dL     Total Cholesterol: 183 mg/dL   Hypoglycemia    Iron deficiency anemia due to dietary causes 12/07/2018   Lung nodule seen on imaging study 06/23/2018   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   Estimated Creatinine Clearance: 51.7 mL/min (by C-G formula based on SCr of 1.15 mg/dL).   Syncope    presented with atrial fib converted with Diltiazem    Systemic inflammatory response syndrome 06/26/2018   Thrombocytopenia 01/19/2021   Past Surgical History:  Procedure Laterality Date   CYSTOSCOPY WITH URETEROSCOPY AND STENT PLACEMENT Left 08/16/2018   Procedure: CYSTOSCOPY WITH LEFT RETROGRADE PYELOGRAM, BALLOON DILATION LEFT URETER, STENT PLACEMENT;  Surgeon: Samson Croak, MD;  Location: [facility];  Service: Urology;  Laterality: Left;   HERNIA REPAIR     INSERT / REPLACE / REMOVE PACEMAKER     LAPAROSCOPIC ASSISTED VENTRAL HERNIA REPAIR N/A 10/18/2018   Procedure: LAPAROSCOPIC ASSISTED VENTRAL WALL HERNIA REPAIR WITH MESH;  Surgeon: Candyce Champagne, MD;  Location: [facility];  Service: General;  Laterality: N/A;   LAPAROSCOPIC NEPHRECTOMY,  HAND ASSISTED Left 10/18/2018   Procedure: LEFT HAND ASSISTED LAPAROSCOPIC RADICAL NEPHRECTOMY WITH ADRENALECTOMY;  Surgeon: Samson Croak, MD;  Location: [facility];  Service: Urology;  Laterality: Left;   LOOP RECORDER INSERTION N/A 04/26/2018   Procedure: LOOP RECORDER INSERTION;  Surgeon: Jolly Needle, MD;  Location: [facility];  Service: Cardiovascular;  Laterality: N/A;   LOOP RECORDER REMOVAL N/A 07/18/2018   Procedure: LOOP RECORDER REMOVAL;  Surgeon: Jolly Needle, MD;  Location: [facility];  Service: Cardiovascular;  Laterality: N/A;   NASAL SEPTUM SURGERY     PACEMAKER IMPLANT N/A 07/18/2018   Procedure: PACEMAKER IMPLANT;  Surgeon: Jolly Needle, MD;  Location: [facility];  Service: Cardiovascular;  Laterality: N/A;   SKIN BIOPSY     TONSILLECTOMY     TONSILLECTOMY     UMBILICAL HERNIA REPAIR  1993  WISDOM TOOTH EXTRACTION     about 83 years old   Family History  Problem Relation Age of Onset   Emphysema Father    Memory loss Sister    Gallbladder disease Sister    Bipolar disorder Sister    Bipolar disorder Paternal Grandmother    Dementia Paternal Grandmother    Memory loss Paternal Grandmother    Colon cancer Neg Hx    Social History   Socioeconomic History   Marital status: Married    Spouse name: Not on file   Number of children: 1   Years of education: 12   Highest education level: Some college, no degree  Occupational History   Occupation: Retired    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, 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[employer]); 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  Tobacco Use   Smoking status: Former    Current packs/day: 0.00    Types: Cigarettes    Quit date: 03/02/1987    Years since quitting: 36.4   Smokeless tobacco: Never  Vaping Use   Vaping status: Never Used  Substance and Sexual Activity   Alcohol  use: No    Alcohol /week: 0.0 standard drinks of alcohol    Drug use: No   Sexual activity: Not Currently  Other Topics Concern   Not on file  Social  History Narrative   Lives with wife and 2 dogs/2025   Social Drivers of Health   Financial Resource Strain: Low Risk  (07/25/2023)   Overall Financial Resource Strain (CARDIA)    Difficulty of Paying Living Expenses: Not hard at all  Food Insecurity: No Food Insecurity (07/25/2023)   Hunger Vital Sign    Worried About Running Out of Food in the Last Year: Never true    Ran Out of Food in the Last Year: Never true  Transportation Needs: No Transportation Needs (07/25/2023)   PRAPARE - Administrator, Civil Service (Medical): No    Lack of Transportation (Non-Medical): No  Physical Activity: Unknown (07/25/2023)   Exercise Vital Sign    Days of Exercise per Week: Patient declined    Minutes of Exercise per Session: 30 min  Stress: No Stress Concern Present (07/25/2023)   [employer] of Occupational Health - Occupational Stress Questionnaire    Feeling of Stress : Only a little  Social Connections: Unknown (07/25/2023)   Social Connection and Isolation Panel [NHANES]    Frequency of Communication with Friends and Family: Three times a week    Frequency of Social Gatherings with Friends and Family: Three times a week    Attends Religious Services: Patient declined    Active Member of Clubs or Organizations: Yes    Attends Engineer, structural: More than 4 times per year    Marital Status: Married    Tobacco Counseling Counseling given: Not Answered    Clinical Intake:  Pre-visit preparation completed: Yes  Pain : No/denies pain     BMI - recorded: 32.41 Nutritional Status: BMI > 30  Obese Nutritional Risks: None Diabetes: No  Lab Results  Component Value Date   HGBA1C 5.9 01/19/2021   HGBA1C 5.7 01/17/2020   HGBA1C 6.2 12/06/2018     How often do you need to have someone help you when you read instructions, pamphlets, or other written materials from your doctor or pharmacy?: 1 - Never  Interpreter Needed?: No  Information entered by ::  Ennis Heavner, RMA   Activities of Daily Living     07/25/2023    2:02 PM  In your  present state of health, do you have any difficulty performing the following activities:  Hearing? 1  Difficulty concentrating or making decisions? 0  Walking or climbing stairs? 0  Dressing or bathing? 0  Doing errands, shopping? 0  Preparing Food and eating ? N  Using the Toilet? N  In the past six months, have you accidently leaked urine? N  Do you have problems with loss of bowel control? N  Managing your Medications? N  Managing your Finances? N  Housekeeping or managing your Housekeeping? N    Patient Care Team: [name], MD as PCP - General Nahser, Lela Purple, MD as PCP - Cardiology (Cardiology) Boyce Byes, MD as PCP - Electrophysiology (Cardiology) Tobin Forts, MD as Consulting Physician (Gastroenterology) Candyce Champagne, MD as Consulting Physician (General Surgery) Jolly Needle, MD (Inactive) as Consulting Physician (Cardiology) Samson Croak, MD as Consulting Physician (Urology) Szabat, Tino Foreman, [facility] (Inactive) (Pharmacist) Marlene Simas, MD as Consulting Physician (Oncology) Pa, [facility]  Indicate any recent Medical Services you may have received from other than [facility] providers in the past year (date may be approximate).     Assessment:    This is a routine wellness examination for [facility].  Hearing/Vision screen Hearing Screening - Comments:: Wears hearing aides Vision Screening - Comments:: Wears eyeglasses/   Goals Addressed             This Visit's Progress    Patient Stated   On track    Stay as healthy and as independent as possible.       Depression Screen     07/29/2023    2:11 PM 01/31/2023    1:14 PM 04/15/2021   11:48 AM 02/15/2020    4:20 PM 01/17/2020    1:24 PM 01/15/2019    8:54 AM 01/15/2019    8:35 AM  PHQ 2/9 Scores  PHQ - 2 Score 0 0 0 0 0 2 2  PHQ- 9 Score 1     8     Fall Risk     07/25/2023     2:02 PM 01/31/2023    1:14 PM 04/15/2021   11:51 AM 02/15/2020    4:19 PM 01/17/2020    1:24 PM  Fall Risk   Falls in the past year? 1 0 0 0 0  Number falls in past yr: 0 0 0 0   Injury with Fall? 0 0 0 0   Risk for fall due to : Impaired balance/gait No Fall Risks Impaired vision No Fall Risks   Follow up Falls evaluation completed;Falls prevention discussed Falls evaluation completed Falls prevention discussed Falls evaluation completed     MEDICARE RISK AT HOME:  Medicare Risk at Home Any stairs in or around the home?: (Patient-Rptd) Yes If so, are there any without handrails?: (Patient-Rptd) No Home free of loose throw rugs in walkways, pet beds, electrical cords, etc?: (Patient-Rptd) Yes Adequate lighting in your home to reduce risk of falls?: (Patient-Rptd) Yes Life alert?: (Patient-Rptd) No Use of a cane, walker or w/c?: (Patient-Rptd) No Grab bars in the bathroom?: (Patient-Rptd) Yes Shower chair or bench in shower?: (Patient-Rptd) No Elevated toilet seat or a handicapped toilet?: (Patient-Rptd) No  TIMED UP AND GO:  Was the test performed?  No  Cognitive Function: Declined/Normal: No cognitive concerns noted by patient or family. Patient alert, oriented, able to answer questions appropriately and recall recent events. No signs of memory loss or confusion.    01/11/2018  10:13 AM  MMSE - Mini Mental State Exam  Not completed: Refused        Immunizations Immunization History  Administered Date(s) Administered   Fluad Quad(high Dose 65+) 12/06/2018, 01/17/2020, 12/05/2020, 11/24/2021   Fluad Trivalent(High Dose 65+) 01/31/2023   Influenza Whole 01/19/2010   Influenza, High Dose Seasonal PF 01/06/2016, 01/10/2017, 01/11/2018   Influenza,inj,Quad PF,6+ Mos 11/28/2012, 12/03/2013, 12/25/2014   PFIZER(Purple Top)SARS-COV-2 Vaccination 04/15/2019, 05/08/2019, 02/01/2020   Pneumococcal Conjugate-13 11/28/2012   Pneumococcal Polysaccharide-23 12/03/2013, 01/17/2020    Td 03/01/2006   Tdap 04/07/2016   Zoster Recombinant(Shingrix) 10/18/2017, 05/09/2018   Zoster, Live 03/30/2013    Screening Tests Health Maintenance  Topic Date Due   Colonoscopy  01/31/2024 (Originally 08/09/2021)   INFLUENZA VACCINE  09/30/2023   Medicare Annual Wellness (AWV)  07/28/2024   DTaP/Tdap/Td (3 - Td or Tdap) 04/07/2026   Pneumonia Vaccine 78+ Years old  Completed   Zoster Vaccines- Shingrix  Completed   HPV VACCINES  Aged Out   Meningococcal B Vaccine  Aged Out   COVID-19 Vaccine  Discontinued   Hepatitis C Screening  Discontinued    Health Maintenance  There are no preventive care reminders to display for this patient. Health Maintenance Items Addressed: See Nurse Notes  Additional Screening:  Vision Screening: Recommended annual ophthalmology exams for early detection of glaucoma and other disorders of the eye.  Dental Screening: Recommended annual dental exams for proper oral hygiene  Community Resource Referral / Chronic Care Management: CRR required this visit?  No   CCM required this visit?  No   Plan:    I have personally reviewed and noted the following in the patient's chart:   Medical and social history Use of alcohol , tobacco or illicit drugs  Current medications and supplements including opioid prescriptions. Patient is not currently taking opioid prescriptions. Functional ability and status Nutritional status Physical activity Advanced directives List of other physicians Hospitalizations, surgeries, and ER visits in previous 12 months Vitals Screenings to include cognitive, depression, and falls Referrals and appointments  In addition, I have reviewed and discussed with patient certain preventive protocols, quality metrics, and best practice recommendations. A written personalized care plan for preventive services as well as general preventive health recommendations were provided to patient.   Vian Fluegel L Lisabeth Mian, CMA   07/29/2023    After Visit Summary: (MyChart) Due to this being a telephonic visit, the after visit summary with patients personalized plan was offered to patient via MyChart   Notes: Please refer to Routing Comments.

## 2023-08-01 ENCOUNTER — Encounter: Payer: Self-pay | Admitting: Internal Medicine

## 2023-08-01 ENCOUNTER — Ambulatory Visit (INDEPENDENT_AMBULATORY_CARE_PROVIDER_SITE_OTHER): Payer: Medicare Other | Admitting: Internal Medicine

## 2023-08-01 VITALS — BP 122/86 | HR 81 | Temp 97.4°F | Ht 64.0 in | Wt 191.6 lb

## 2023-08-01 DIAGNOSIS — I480 Paroxysmal atrial fibrillation: Secondary | ICD-10-CM

## 2023-08-01 DIAGNOSIS — I10 Essential (primary) hypertension: Secondary | ICD-10-CM | POA: Diagnosis not present

## 2023-08-01 DIAGNOSIS — D508 Other iron deficiency anemias: Secondary | ICD-10-CM

## 2023-08-01 DIAGNOSIS — N1832 Chronic kidney disease, stage 3b: Secondary | ICD-10-CM | POA: Diagnosis not present

## 2023-08-01 NOTE — Progress Notes (Unsigned)
 Subjective:  Patient ID: Frank Ing., male    DOB: [birth year]  Age: 83 y.o. MRN: [id number]  CC: Medical Management of Chronic Issues (6 month follow up. No concerns. )   HPI [name] presents for f/up ----  Discussed the use of AI scribe software for clinical note transcription with the patient, who gave verbal consent to proceed.  History of Present Illness   [name] "[name]" is an 83 year old male who presents with nasal congestion and abdominal discomfort.  He has experienced nasal congestion for about a month, initially attributing it to pollen. He used Afrin for relief but found it ineffective. He then switched to Flonase, which significantly improved his symptoms, clearing up the congestion.  Two days ago, he noticed a new onset of discomfort in the abdominal area, described as uncomfortable but not painful. His wife examined the area and did not notice any abnormalities. No significant stomach issues are reported.  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       Outpatient Medications Prior to Visit  Medication Sig Dispense Refill   acetaminophen  (TYLENOL ) 500 MG tablet Take 1,000 mg by mouth every 6 (six) hours as needed for mild pain.     Calcium  Carbonate (CALCIUM  500 PO) Take 500 mg by mouth 2 (two) times daily.     denosumab  (PROLIA ) 60 MG/ML SOSY injection Inject 60 mg into the skin every 6 (six) months.     esomeprazole  (NEXIUM ) 40 MG capsule Take 1 capsule (40 mg total) by mouth daily before breakfast. 90 capsule 3   metoprolol  succinate (TOPROL  XL) 25 MG 24 hr tablet Take 2 tablets (50 mg total) by mouth at bedtime. 90 tablet 3   Multiple Vitamins-Minerals (CENTRUM MULTI + OMEGA 3 PO) Take 1 tablet by mouth daily.      omega-3 acid ethyl esters (LOVAZA ) 1 g  capsule Take 2 capsules by mouth twice daily 360 capsule 0   rosuvastatin  (CRESTOR ) 10 MG tablet Take 1 tablet (10 mg total) by mouth daily. 90 tablet 1   solifenacin  (VESICARE ) 10 MG tablet Take 1 tablet (10 mg total) by mouth at bedtime. 90 tablet 1   VITAMIN D  PO Take 1,000 Units by mouth daily.     Facility-Administered Medications Prior to Visit  Medication Dose Route Frequency Provider Last Rate Last Admin   [START ON 08/10/2023] denosumab  (PROLIA ) injection 60 mg  60 mg Subcutaneous Once Zorita Hiss, NP        ROS Review of Systems  Objective:  BP 122/86 (BP Location: Left Arm, Patient Position: Sitting, Cuff Size: Normal)   Pulse 81   Temp (!) 97.4 F (36.3 C) (Oral)   Ht 5\' 4"  (1.626 m)   Wt 191 lb 9.6 oz (86.9 kg)   SpO2 95%   BMI 32.89 kg/m   BP Readings from Last 3 Encounters:  08/01/23 122/86  07/29/23 118/80  07/15/23 (!) 110/58    Wt Readings from Last 3 Encounters:  08/01/23 191 lb 9.6 oz (86.9 kg)  07/29/23 188 lb 12.8 oz (85.6 kg)  07/15/23 189 lb (85.7 kg)    Physical Exam  Lab Results  Component Value Date   WBC 6.1 06/27/2023   HGB 14.1 06/27/2023   HCT 39.3 06/27/2023  PLT 103 (L) 06/27/2023   GLUCOSE 112 (H) 06/27/2023   CHOL 129 01/31/2023   TRIG 136.0 01/31/2023   HDL 35.10 (L) 01/31/2023   LDLDIRECT 59.0 01/17/2020   LDLCALC 67 01/31/2023   ALT 15 06/27/2023   AST 20 06/27/2023   NA 132 (L) 06/27/2023   K 4.3 06/27/2023   CL 101 06/27/2023   CREATININE 1.01 06/27/2023   BUN 14 06/27/2023   CO2 27 06/27/2023   TSH 2.15 01/31/2023   PSA 0.10 01/17/2020   INR 1.4 (H) 02/12/2022   HGBA1C 5.9 01/19/2021    CT Chest Wo Contrast Result Date: 07/02/2023 CLINICAL DATA:  Restaging non-small cell lung cancer. * Tracking Code: BO *. EXAM: CT CHEST WITHOUT CONTRAST TECHNIQUE: Multidetector CT imaging of the chest was performed following the standard protocol without IV contrast. RADIATION DOSE REDUCTION: This exam was performed according  to the departmental dose-optimization program which includes automated exposure control, adjustment of the mA and/or kV according to patient size and/or use of iterative reconstruction technique. COMPARISON:  12/28/2022 FINDINGS: Cardiovascular: There is a left chest wall pacer with leads in the right atrial appendage and right ventricle. Mild cardiac enlargement. No pericardial effusion. Aortic atherosclerosis and multi vessel coronary artery calcification. Mediastinum/Nodes: Thyroid  gland, trachea and esophagus are normal. Small hiatal hernia noted. No mediastinal or axillary adenopathy. Lungs/Pleura: No pleural effusion. Emphysema with diffuse bronchial wall thickening. Bilateral peripheral and lower lung zone predominant subpleural reticulation with mild traction bronchiectasis. Multiple pulmonary nodules are identified within both lungs compatible with metastatic disease. Index subpleural nodule overlying the anterior right upper lobe measures 8 mm, image 79/6. Previously 6 mm. Central left upper lobe lung nodule measures 8 mm, image 74/6. Previously 6 mm. Right middle lobe lung nodule measures 1.4 cm, image 115/6. Previously 1.1 cm. Right lung base nodule measures 0.9 cm, image 122/6. Previously 0.7 cm. No new lung nodules identified. Upper Abdomen: No acute abnormality within the imaged portions of the upper abdomen. Please refer to CT abdomen report from 06/27/2023. Musculoskeletal: Unchanged chronic severe compression fractures involving T12 and L1. No acute or suspicious osseous findings. IMPRESSION: 1. Mild interval increase in size of multiple pulmonary nodules compatible with metastatic disease. 2. No new lung nodules identified. 3. Bilateral peripheral and lower lung zone predominant subpleural reticulation with mild traction bronchiectasis. 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 4. Coronary artery calcifications. 5. Aortic Atherosclerosis (ICD10-I70.0) and Emphysema (ICD10-J43.9).  Electronically Signed   By: Kimberley Penman M.D.   On: 07/02/2023 12:47   CT ABDOMEN PELVIS WO CONTRAST Result Date: 06/30/2023 EXAMINATION: CT ABDOMEN PELVIS WO CONTRAST CLINICAL INDICATION: Male, 83 years old. Non-small cell lung cancer (NSCLC), staging TECHNIQUE: Helical CT scan examination of the abdomen and pelvis is performed from the domes of the diaphragm to the pubic symphysis. Limited evaluation of the solid organs due to lack of intravenous contrast. Unless otherwise specified, incidental thyroid , adrenal, renal lesions do not require dedicated imaging follow up. Additionally, any mentioned pulmonary nodules do not require dedicated imaging follow-up based on the Fleischner guidelines unless otherwise specified. Coronary calcifications are not identified unless otherwise specified. COMPARISON: 12/28/2022 FINDINGS: Regarding findings of the lung bases, please refer to the chest report. The liver contains a 2 subcentimeter probable cysts. The gallbladder is normal. Peripheral calcification along the spleen may be from prior trauma. The pancreas demonstrates fatty infiltration. The right adrenal is normal. The left adrenal may be surgically absent or atrophic. The right kidney is noted to contain a small  hyperdense lesion possibly representing a hemorrhagic cyst. The left kidney is surgically absent. There is a 3.4 cm infrarenal abdominal aortic aneurysm. Scattered calcified atherosclerotic changes are present in the bladder is normal. The prostate is normal. There is colonic diverticulosis. The appendix is normal. Large and small bowel loops are otherwise within normal limits. There is no free fluid or pathologic lymphadenopathy by size criteria. No concerning osseous lesions are identified. There is diffuse osseous demineralization. There are old compression fractures noted at T12 and L1. There are degenerative changes of the spine. IMPRESSION: No convincing evidence for metastatic disease within the  abdomen or pelvis. 3.4 cm infrarenal abdominal aortic aneurysm. Recommend follow-up in 3 years. DOSE REDUCTION: This exam was performed according to our departmental dose-optimization program which includes automated exposure control, adjustment of the mA and/or kV according to patient size and/or use of iterative reconstruction technique. Electronically signed by: [place] Engel MD 06/30/2023 10:15 AM EDT RP Workstation: [id number]    Assessment & Plan:  There are no diagnoses linked to this encounter.   Follow-up: No follow-ups on file.  Sandra Crouch, MD

## 2023-08-01 NOTE — Patient Instructions (Signed)
 Chronic Kidney Disease in Adults: What to Know Chronic kidney disease (CKD) is when lasting damage happens to the kidneys slowly over time. The kidneys are two organs that do many important things in the body. These include: Taking waste and extra fluid out of the blood to make pee (urine). Making hormones. Keeping the right amount of fluids and chemicals in the body. A small amount of kidney damage may not cause problems. You must take steps to help keep the kidney damage from getting worse. A lot of damage may cause kidney failure. Kidney failure means the kidneys can no longer work right. What are the causes? Diabetes. High blood pressure. Diseases that affect the heart and blood vessels. Other kidney diseases. Diseases that affect the body's defense system (immune system). A problem with the flow of pee. This may be caused by: Kidney stones. Cancer. An enlarged prostate, in males. A kidney infection or urinary tract infection (UTI) that keeps coming back. What increases the risk? Getting older. The chances of having CKD increase with age. A family history of kidney disease or kidney failure. Having a disease caused by genes. Taking medicines that can harm the kidneys. Being near or having contact with harmful substances. Being very overweight. Using tobacco now or in the past. What are the signs or symptoms? Common symptoms of CKD include: Feeling very tired and having less energy. Swelling of the face, legs, ankles, or feet. Throwing up or feeling like you may throw up. Not wanting to eat as much as normal. Being confused or not able to focus. Twitches and cramps in the leg muscles or other muscles. Dry, itchy skin. Other symptoms may include: Shortness of breath. Trouble sleeping. Making less pee, or making more pee, especially at night. A taste of metal in your mouth. You may also become anemic. Anemia means there's not enough red blood cells in your blood. You may get  symptoms slowly. You may not notice them until the kidney damage gets very bad. How is this diagnosed? CKD may be diagnosed based on: Tests on your blood or pee. Imaging tests, like an ultrasound or a CT scan. A kidney biopsy. For this test, a sample of kidney tissue is removed to be looked at under a microscope. These tests will help to find out how serious the CKD is. How is this treated? Often, there's no cure for CKD. Treatment can help with symptoms and help keep the disease from getting worse. Treatment may include: Treating other problems that are causing your CKD or making it worse. Diet changes. You may need to: Avoid alcohol. Avoid foods that are high in salt, potassium, phosphorous, and protein. Taking medicines for symptoms and to help control other conditions. Dialysis. This treatment gets harmful waste out of your body. It may be needed if you have kidney failure. Follow these instructions at home: Medicines Take your medicines only as told. The amount of some medicines you take may need to be changed. Do not take any new medicines, vitamins, or supplements unless your health care provider says it's okay. These may make kidney damage worse. Lifestyle Do not smoke, vape, or use nicotine or tobacco. If you drink alcohol: Limit how much you have to: 0-1 drink a day if you're male. 0-2 drinks a day if you're male. Know how much alcohol is in your drink. In the [place] one drink is one 12 oz bottle of beer (355 mL), one 5 oz glass of wine (148 mL), or one 1 oz  glass of hard liquor (44 mL). Stay at a healthy weight. If you need help, ask your provider. General instructions  Eat and drink as told. Track your blood pressure at home. Tell your provider about any changes. If you have diabetes, track your blood sugar as told. Exercise at least 30 minutes a day, 5 days a week. Keep your shots (vaccinations) up to date. Keep all follow-up visits. Your provider may need to change  your treatments over time. Where to find support American Kidney Fund: [place].com.au Kidney School: kidneyschool.org American Association of Kidney Patients: https://www.miller-montoya.com/ Where to find more information National Kidney Foundation: kidney.org Centers for Disease Control and Prevention. To learn more: Go to DiningCalendar.de. Click "Search". Type "chronic kidney disease" in the search box. Contact a health care provider if: You have new symptoms. You get symptoms of end-stage kidney disease. These include: Headaches. Numbness in your hands or feet. Leg cramps. Easy bruising. Get help right away if: You have a fever. You make less pee than usual. You have pain or bleeding when you pee or poop. You have chest pain. You have shortness of breath. These symptoms may be an emergency. Call 911 right away. Do not wait to see if the symptoms will go away. Do not drive yourself to the hospital. This information is not intended to replace advice given to you by your health care provider. Make sure you discuss any questions you have with your health care provider. Document Revised: 12/28/2022 Document Reviewed: 08/20/2022 Elsevier Patient Education  2024 [employer].

## 2023-08-23 NOTE — Progress Notes (Signed)
 Remote pacemaker transmission.

## 2023-08-24 ENCOUNTER — Other Ambulatory Visit: Payer: Self-pay | Admitting: Internal Medicine

## 2023-08-24 DIAGNOSIS — E781 Pure hyperglyceridemia: Secondary | ICD-10-CM

## 2023-08-25 ENCOUNTER — Ambulatory Visit

## 2023-09-01 ENCOUNTER — Ambulatory Visit

## 2023-09-01 DIAGNOSIS — M810 Age-related osteoporosis without current pathological fracture: Secondary | ICD-10-CM | POA: Diagnosis not present

## 2023-09-01 MED ORDER — DENOSUMAB 60 MG/ML ~~LOC~~ SOSY
60.0000 mg | PREFILLED_SYRINGE | SUBCUTANEOUS | Status: AC
  Administered 2024-03-16: 60 mg via SUBCUTANEOUS

## 2023-09-01 NOTE — Progress Notes (Signed)
After obtaining consent, and per orders of [name], injection of Prolia given by Marrian Salvage. Patient instructed to report any adverse reaction to me immediately.

## 2023-10-12 ENCOUNTER — Ambulatory Visit: Payer: Medicare Other

## 2023-10-12 DIAGNOSIS — I495 Sick sinus syndrome: Secondary | ICD-10-CM

## 2023-10-13 ENCOUNTER — Ambulatory Visit: Payer: Self-pay | Admitting: Cardiology

## 2023-10-13 LAB — CUP PACEART REMOTE DEVICE CHECK
Battery Remaining Longevity: 107 mo
Battery Voltage: 2.99 V
Brady Statistic AP VP Percent: 8.83 %
Brady Statistic AP VS Percent: 89.93 %
Brady Statistic AS VP Percent: 0.14 %
Brady Statistic AS VS Percent: 1.09 %
Brady Statistic RA Percent Paced: 99 %
Brady Statistic RV Percent Paced: 8.98 %
Date Time Interrogation Session: 20250812213919
Implantable Lead Connection Status: 753985
Implantable Lead Connection Status: 753985
Implantable Lead Implant Date: 20200519
Implantable Lead Implant Date: 20200519
Implantable Lead Location: 753859
Implantable Lead Location: 753860
Implantable Lead Model: 5076
Implantable Lead Model: 5076
Implantable Pulse Generator Implant Date: 20200519
Lead Channel Impedance Value: 304 Ohm
Lead Channel Impedance Value: 380 Ohm
Lead Channel Impedance Value: 570 Ohm
Lead Channel Impedance Value: 589 Ohm
Lead Channel Pacing Threshold Amplitude: 0.5 V
Lead Channel Pacing Threshold Amplitude: 0.625 V
Lead Channel Pacing Threshold Pulse Width: 0.4 ms
Lead Channel Pacing Threshold Pulse Width: 0.4 ms
Lead Channel Sensing Intrinsic Amplitude: 5 mV
Lead Channel Sensing Intrinsic Amplitude: 5 mV
Lead Channel Sensing Intrinsic Amplitude: 5.125 mV
Lead Channel Sensing Intrinsic Amplitude: 5.125 mV
Lead Channel Setting Pacing Amplitude: 1.5 V
Lead Channel Setting Pacing Amplitude: 2.5 V
Lead Channel Setting Pacing Pulse Width: 0.4 ms
Lead Channel Setting Sensing Sensitivity: 2 mV
Zone Setting Status: 755011
Zone Setting Status: 755011

## 2023-10-19 ENCOUNTER — Encounter: Payer: Self-pay | Admitting: Internal Medicine

## 2023-10-27 ENCOUNTER — Other Ambulatory Visit: Payer: Self-pay | Admitting: Internal Medicine

## 2023-10-27 DIAGNOSIS — E785 Hyperlipidemia, unspecified: Secondary | ICD-10-CM

## 2023-10-28 ENCOUNTER — Encounter: Payer: Self-pay | Admitting: Internal Medicine

## 2023-11-02 DIAGNOSIS — L728 Other follicular cysts of the skin and subcutaneous tissue: Secondary | ICD-10-CM | POA: Diagnosis not present

## 2023-11-15 ENCOUNTER — Other Ambulatory Visit: Payer: Self-pay | Admitting: Student

## 2023-11-15 DIAGNOSIS — I493 Ventricular premature depolarization: Secondary | ICD-10-CM

## 2023-11-24 NOTE — Progress Notes (Signed)
 Remote PPM Transmission

## 2023-11-25 ENCOUNTER — Telehealth: Payer: Self-pay | Admitting: Radiology

## 2023-11-25 NOTE — Telephone Encounter (Signed)
 Copied from CRM [id number]. Topic: General - Other >> Nov 23, 2023  2:16 PM Burnard DEL wrote: Reason for CRM: Patient would like to know when he had his last prolia  injection?

## 2023-11-25 NOTE — Telephone Encounter (Signed)
 Patient has been made aware.

## 2023-11-29 ENCOUNTER — Other Ambulatory Visit: Payer: Self-pay | Admitting: Internal Medicine

## 2023-11-29 DIAGNOSIS — E781 Pure hyperglyceridemia: Secondary | ICD-10-CM

## 2023-12-21 ENCOUNTER — Encounter: Payer: Self-pay | Admitting: Internal Medicine

## 2023-12-21 ENCOUNTER — Ambulatory Visit: Admitting: Internal Medicine

## 2023-12-21 VITALS — BP 136/78 | HR 78 | Temp 97.8°F | Resp 16 | Ht 64.0 in | Wt 193.4 lb

## 2023-12-21 DIAGNOSIS — E785 Hyperlipidemia, unspecified: Secondary | ICD-10-CM

## 2023-12-21 DIAGNOSIS — E871 Hypo-osmolality and hyponatremia: Secondary | ICD-10-CM | POA: Diagnosis not present

## 2023-12-21 DIAGNOSIS — Z23 Encounter for immunization: Secondary | ICD-10-CM

## 2023-12-21 DIAGNOSIS — H6062 Unspecified chronic otitis externa, left ear: Secondary | ICD-10-CM | POA: Diagnosis not present

## 2023-12-21 DIAGNOSIS — D696 Thrombocytopenia, unspecified: Secondary | ICD-10-CM

## 2023-12-21 DIAGNOSIS — Z0000 Encounter for general adult medical examination without abnormal findings: Secondary | ICD-10-CM

## 2023-12-21 DIAGNOSIS — E781 Pure hyperglyceridemia: Secondary | ICD-10-CM

## 2023-12-21 DIAGNOSIS — Z0001 Encounter for general adult medical examination with abnormal findings: Secondary | ICD-10-CM

## 2023-12-21 LAB — URINALYSIS, ROUTINE W REFLEX MICROSCOPIC
Bilirubin Urine: NEGATIVE
Hgb urine dipstick: NEGATIVE
Ketones, ur: NEGATIVE
Nitrite: NEGATIVE
Specific Gravity, Urine: <=1.005 — AB (ref 1.000–1.030)
Total Protein, Urine: NEGATIVE
Urine Glucose: NEGATIVE
Urobilinogen, UA: 0.2 (ref 0.0–1.0)
pH: 7.5 (ref 5.0–8.0)

## 2023-12-21 LAB — TSH: TSH: 1.82 u[IU]/mL (ref 0.35–5.50)

## 2023-12-21 LAB — CBC WITH DIFFERENTIAL/PLATELET
Basophils Absolute: 0 K/uL (ref 0.0–0.1)
Basophils Relative: 0.3 % (ref 0.0–3.0)
Eosinophils Absolute: 0.1 K/uL (ref 0.0–0.7)
Eosinophils Relative: 1.4 % (ref 0.0–5.0)
HCT: 41.7 % (ref 39.0–52.0)
Hemoglobin: 14.2 g/dL (ref 13.0–17.0)
Lymphocytes Relative: 12.6 % (ref 12.0–46.0)
Lymphs Abs: 1 K/uL (ref 0.7–4.0)
MCHC: 33.9 g/dL (ref 30.0–36.0)
MCV: 97.2 fl (ref 78.0–100.0)
Monocytes Absolute: 0.6 K/uL (ref 0.1–1.0)
Monocytes Relative: 7.9 % (ref 3.0–12.0)
Neutro Abs: 6 K/uL (ref 1.4–7.7)
Neutrophils Relative %: 77.8 % — ABNORMAL HIGH (ref 43.0–77.0)
Platelets: 116 K/uL — ABNORMAL LOW (ref 150.0–400.0)
RBC: 4.29 Mil/uL (ref 4.22–5.81)
RDW: 14.3 % (ref 11.5–15.5)
WBC: 7.7 K/uL (ref 4.0–10.5)

## 2023-12-21 LAB — LIPID PANEL
Cholesterol: 112 mg/dL (ref 0–200)
HDL: 34.7 mg/dL — ABNORMAL LOW (ref >39.00)
LDL Cholesterol: 53 mg/dL (ref 0–99)
NonHDL: 77.28
Total CHOL/HDL Ratio: 3
Triglycerides: 123 mg/dL (ref 0.0–149.0)
VLDL: 24.6 mg/dL (ref 0.0–40.0)

## 2023-12-21 LAB — BASIC METABOLIC PANEL WITH GFR
BUN: 16 mg/dL (ref 6–23)
CO2: 27 meq/L (ref 19–32)
Calcium: 9.3 mg/dL (ref 8.4–10.5)
Chloride: 97 meq/L (ref 96–112)
Creatinine, Ser: 1.05 mg/dL (ref 0.40–1.50)
GFR: 66.39 mL/min (ref >60.00)
Glucose, Bld: 113 mg/dL — ABNORMAL HIGH (ref 70–99)
Potassium: 4.2 meq/L (ref 3.5–5.1)
Sodium: 131 meq/L — ABNORMAL LOW (ref 135–145)

## 2023-12-21 LAB — HEMOGLOBIN A1C: Hgb A1c MFr Bld: 6.3 % (ref 4.6–6.5)

## 2023-12-21 NOTE — Patient Instructions (Signed)
 Health Maintenance, Male  Adopting a healthy lifestyle and getting preventive care are important in promoting health and wellness. Ask your health care provider about:  The right schedule for you to have regular tests and exams.  Things you can do on your own to prevent diseases and keep yourself healthy.  What should I know about diet, weight, and exercise?  Eat a healthy diet    Eat a diet that includes plenty of vegetables, fruits, low-fat dairy products, and lean protein.  Do not eat a lot of foods that are high in solid fats, added sugars, or sodium.  Maintain a healthy weight  Body mass index (BMI) is a measurement that can be used to identify possible weight problems. It estimates body fat based on height and weight. Your health care provider can help determine your BMI and help you achieve or maintain a healthy weight.  Get regular exercise  Get regular exercise. This is one of the most important things you can do for your health. Most adults should:  Exercise for at least 150 minutes each week. The exercise should increase your heart rate and make you sweat (moderate-intensity exercise).  Do strengthening exercises at least twice a week. This is in addition to the moderate-intensity exercise.  Spend less time sitting. Even light physical activity can be beneficial.  Watch cholesterol and blood lipids  Have your blood tested for lipids and cholesterol at 82 years of age, then have this test every 5 years.  You may need to have your cholesterol levels checked more often if:  Your lipid or cholesterol levels are high.  You are older than 82 years of age.  You are at high risk for heart disease.  What should I know about cancer screening?  Many types of cancers can be detected early and may often be prevented. Depending on your health history and family history, you may need to have cancer screening at various ages. This may include screening for:  Colorectal cancer.  Prostate cancer.  Skin cancer.  Lung  cancer.  What should I know about heart disease, diabetes, and high blood pressure?  Blood pressure and heart disease  High blood pressure causes heart disease and increases the risk of stroke. This is more likely to develop in people who have high blood pressure readings or are overweight.  Talk with your health care provider about your target blood pressure readings.  Have your blood pressure checked:  Every 3-5 years if you are 24-52 years of age.  Every year if you are 3 years old or older.  If you are between the ages of 60 and 72 and are a current or former smoker, ask your health care provider if you should have a one-time screening for abdominal aortic aneurysm (AAA).  Diabetes  Have regular diabetes screenings. This checks your fasting blood sugar level. Have the screening done:  Once every three years after age 66 if you are at a normal weight and have a low risk for diabetes.  More often and at a younger age if you are overweight or have a high risk for diabetes.  What should I know about preventing infection?  Hepatitis B  If you have a higher risk for hepatitis B, you should be screened for this virus. Talk with your health care provider to find out if you are at risk for hepatitis B infection.  Hepatitis C  Blood testing is recommended for:  Everyone born from [birth year] through 1964.  Anyone  with known risk factors for hepatitis C.  Sexually transmitted infections (STIs)  You should be screened each year for STIs, including gonorrhea and chlamydia, if:  You are sexually active and are younger than 82 years of age.  You are older than 82 years of age and your health care provider tells you that you are at risk for this type of infection.  Your sexual activity has changed since you were last screened, and you are at increased risk for chlamydia or gonorrhea. Ask your health care provider if you are at risk.  Ask your health care provider about whether you are at high risk for HIV. Your health care provider  may recommend a prescription medicine to help prevent HIV infection. If you choose to take medicine to prevent HIV, you should first get tested for HIV. You should then be tested every 3 months for as long as you are taking the medicine.  Follow these instructions at home:  Alcohol use  Do not drink alcohol if your health care provider tells you not to drink.  If you drink alcohol:  Limit how much you have to 0-2 drinks a day.  Know how much alcohol is in your drink. In the [place] one drink equals one 12 oz bottle of beer (355 mL), one 5 oz glass of wine (148 mL), or one 1 oz glass of hard liquor (44 mL).  Lifestyle  Do not use any products that contain nicotine or tobacco. These products include cigarettes, chewing tobacco, and vaping devices, such as e-cigarettes. If you need help quitting, ask your health care provider.  Do not use street drugs.  Do not share needles.  Ask your health care provider for help if you need support or information about quitting drugs.  General instructions  Schedule regular health, dental, and eye exams.  Stay current with your vaccines.  Tell your health care provider if:  You often feel depressed.  You have ever been abused or do not feel safe at home.  Summary  Adopting a healthy lifestyle and getting preventive care are important in promoting health and wellness.  Follow your health care provider's instructions about healthy diet, exercising, and getting tested or screened for diseases.  Follow your health care provider's instructions on monitoring your cholesterol and blood pressure.  This information is not intended to replace advice given to you by your health care provider. Make sure you discuss any questions you have with your health care provider.  Document Revised: 07/07/2020 Document Reviewed: 07/07/2020  Elsevier Patient Education  2024 [employer].

## 2023-12-21 NOTE — Progress Notes (Signed)
 Subjective:  Patient ID: Frank JINNY Joesph Mickey., male    DOB: 05-21-1940  Age: 83 y.o. MRN: [id number]  CC: Annual Exam, Hyperlipidemia, and Ear Pain   HPI [name] presents for a CPX and f/up ---  Discussed the use of AI scribe software for clinical note transcription with the patient, who gave verbal consent to proceed.  History of Present Illness Frank JINNY Joesph Mickey. Frank [name] is an 83 year old male who presents with a sore left ear.  He has been experiencing soreness in his left ear since acquiring new hearing aids. A small, white cyst becomes sore when inserting the hearing aid. A dermatologist attempted to drain the cyst with a needle, but minimal fluid was obtained. He applies Polysporin, which alleviates discomfort unless the area is touched. No ear discharge is present.  He has a history of atrial fibrillation, well-controlled with medication for almost two years. He takes two blood pressure medications at night, which have been beneficial for his heart condition. No chest pain, shortness of breath, dizziness, or lightheadedness.  He has a history of nasal congestion, previously managed with Afrin, now effectively controlled with Flonase. No coughing or wheezing.  He maintains regular bowel movements with daily prune juice consumption. He experiences itching in the groin area, treated with Lotrimin for jock itch, with no rash present.  He experiences increased urinary frequency, waking at least twice a night but is able to return to sleep. He was unable to provide a urine specimen immediately as he used the bathroom prior to the visit.  He describes numbness in his foot and reports a history of back problems. He and his wife have started using a device to help with nerve issues, which he feels might be beneficial. No pain, tingling, or swelling in the area.     Outpatient Medications Prior to Visit  Medication Sig Dispense Refill   acetaminophen  (TYLENOL ) 500 MG tablet  Take 1,000 mg by mouth every 6 (six) hours as needed for mild pain.     Calcium  Carbonate (CALCIUM  500 PO) Take 500 mg by mouth 2 (two) times daily.     denosumab  (PROLIA ) 60 MG/ML SOSY injection Inject 60 mg into the skin every 6 (six) months.     esomeprazole  (NEXIUM ) 40 MG capsule Take 1 capsule (40 mg total) by mouth daily before breakfast. 90 capsule 3   fluticasone (FLONASE) 50 MCG/ACT nasal spray Place into both nostrils daily.     metoprolol  succinate (TOPROL -XL) 25 MG 24 hr tablet TAKE 2 TABLETS BY MOUTH AT BEDTIME 90 tablet 2   Multiple Vitamins-Minerals (CENTRUM MULTI + OMEGA 3 PO) Take 1 tablet by mouth daily.      omega-3 acid ethyl esters (LOVAZA ) 1 g capsule Take 2 capsules by mouth twice daily 360 capsule 0   rosuvastatin  (CRESTOR ) 10 MG tablet Take 1 tablet by mouth once daily 90 tablet 0   solifenacin  (VESICARE ) 10 MG tablet TAKE 1 TABLET BY MOUTH AT BEDTIME 90 tablet 0   VITAMIN A PO Take by mouth daily.     VITAMIN D  PO Take 1,000 Units by mouth daily.     Facility-Administered Medications Prior to Visit  Medication Dose Route Frequency Provider Last Rate Last Admin   denosumab  (PROLIA ) injection 60 mg  60 mg Subcutaneous Q6 months [name] Frank CROME, MD        ROS Review of Systems  Constitutional:  Negative for appetite change, chills, diaphoresis, fatigue and fever.  HENT:  Positive for ear pain and hearing loss. Negative for ear discharge and trouble swallowing.   Respiratory:  Negative for cough, chest tightness, shortness of breath and wheezing.   Cardiovascular:  Negative for chest pain, palpitations and leg swelling.  Gastrointestinal: Negative.  Negative for abdominal pain, constipation, diarrhea, nausea and vomiting.  Genitourinary:  Negative for difficulty urinating.  Musculoskeletal: Negative.  Negative for arthralgias and myalgias.  Skin: Negative.   Neurological:  Positive for tremors and weakness. Negative for dizziness, syncope and light-headedness.   Hematological:  Negative for adenopathy. Does not bruise/bleed easily.    Objective:  BP 136/78 (BP Location: Left Arm, Patient Position: Sitting, Cuff Size: Normal)   Pulse 78   Temp 97.8 F (36.6 C) (Oral)   Resp 16   Ht 5' 4 (1.626 m)   Wt 193 lb 6.4 oz (87.7 kg)   SpO2 95%   BMI 33.20 kg/m   BP Readings from Last 3 Encounters:  12/21/23 136/78  08/01/23 122/86  07/29/23 118/80    Wt Readings from Last 3 Encounters:  12/21/23 193 lb 6.4 oz (87.7 kg)  08/01/23 191 lb 9.6 oz (86.9 kg)  07/29/23 188 lb 12.8 oz (85.6 kg)    Physical Exam Vitals reviewed.  HENT:     Right Ear: Hearing, tympanic membrane, ear canal and external ear normal. No middle ear effusion. There is no impacted cerumen. No foreign body.     Left Ear: Hearing, tympanic membrane and external ear normal.  No middle ear effusion. There is no impacted cerumen. No foreign body.     Ears:     Comments: There is a small area of erythema over the floor of the left EAC    Nose: Nose normal.     Mouth/Throat:     Mouth: Mucous membranes are moist.  Eyes:     General: No scleral icterus.    Conjunctiva/sclera: Conjunctivae normal.  Cardiovascular:     Rate and Rhythm: Normal rate and regular rhythm.     Heart sounds: No murmur heard.    No friction rub. No gallop.  Pulmonary:     Effort: Pulmonary effort is normal.     Breath sounds: No stridor. No wheezing, rhonchi or rales.  Abdominal:     General: Abdomen is flat.     Palpations: There is no mass.     Tenderness: There is no abdominal tenderness. There is no guarding.     Hernia: No hernia is present.  Musculoskeletal:        General: Normal range of motion.     Cervical back: Neck supple.     Right lower leg: No edema.     Left lower leg: No edema.  Lymphadenopathy:     Cervical: No cervical adenopathy.  Skin:    General: Skin is warm and dry.  Neurological:     General: No focal deficit present.     Mental Status: He is alert. Mental  status is at baseline.  Psychiatric:        Mood and Affect: Mood normal.        Behavior: Behavior normal.     Lab Results  Component Value Date   WBC 7.7 12/21/2023   HGB 14.2 12/21/2023   HCT 41.7 12/21/2023   PLT 116.0 (L) 12/21/2023   GLUCOSE 113 (H) 12/21/2023   CHOL 112 12/21/2023   TRIG 123.0 12/21/2023   HDL 34.70 (L) 12/21/2023   LDLDIRECT 59.0 01/17/2020   LDLCALC 53 12/21/2023  ALT 15 06/27/2023   AST 20 06/27/2023   NA 131 (L) 12/21/2023   K 4.2 12/21/2023   CL 97 12/21/2023   CREATININE 1.05 12/21/2023   BUN 16 12/21/2023   CO2 27 12/21/2023   TSH 1.82 12/21/2023   PSA 0.10 01/17/2020   INR 1.4 (H) 02/12/2022   HGBA1C 6.3 12/21/2023    CT Chest Wo Contrast Result Date: 07/02/2023 CLINICAL DATA:  Restaging non-small cell lung cancer. * Tracking Code: BO *. EXAM: CT CHEST WITHOUT CONTRAST TECHNIQUE: Multidetector CT imaging of the chest was performed following the standard protocol without IV contrast. RADIATION DOSE REDUCTION: This exam was performed according to the departmental dose-optimization program which includes automated exposure control, adjustment of the mA and/or kV according to patient size and/or use of iterative reconstruction technique. COMPARISON:  12/28/2022 FINDINGS: Cardiovascular: There is a left chest wall pacer with leads in the right atrial appendage and right ventricle. Mild cardiac enlargement. No pericardial effusion. Aortic atherosclerosis and multi vessel coronary artery calcification. Mediastinum/Nodes: Thyroid  gland, trachea and esophagus are normal. Small hiatal hernia noted. No mediastinal or axillary adenopathy. Lungs/Pleura: No pleural effusion. Emphysema with diffuse bronchial wall thickening. Bilateral peripheral and lower lung zone predominant subpleural reticulation with mild traction bronchiectasis. Multiple pulmonary nodules are identified within both lungs compatible with metastatic disease. Index subpleural nodule overlying the  anterior right upper lobe measures 8 mm, image 79/6. Previously 6 mm. Central left upper lobe lung nodule measures 8 mm, image 74/6. Previously 6 mm. Right middle lobe lung nodule measures 1.4 cm, image 115/6. Previously 1.1 cm. Right lung base nodule measures 0.9 cm, image 122/6. Previously 0.7 cm. No new lung nodules identified. Upper Abdomen: No acute abnormality within the imaged portions of the upper abdomen. Please refer to CT abdomen report from 06/27/2023. Musculoskeletal: Unchanged chronic severe compression fractures involving T12 and L1. No acute or suspicious osseous findings. IMPRESSION: 1. Mild interval increase in size of multiple pulmonary nodules compatible with metastatic disease. 2. No new lung nodules identified. 3. Bilateral peripheral and lower lung zone predominant subpleural reticulation with mild traction bronchiectasis. 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 4. Coronary artery calcifications. 5. Aortic Atherosclerosis (ICD10-I70.0) and Emphysema (ICD10-J43.9). Electronically Signed   By: Waddell Calk M.D.   On: 07/02/2023 12:47   CT ABDOMEN PELVIS WO CONTRAST Result Date: 06/30/2023 EXAMINATION: CT ABDOMEN PELVIS WO CONTRAST CLINICAL INDICATION: Male, 83 years old. Non-small cell lung cancer (NSCLC), staging TECHNIQUE: Helical CT scan examination of the abdomen and pelvis is performed from the domes of the diaphragm to the pubic symphysis. Limited evaluation of the solid organs due to lack of intravenous contrast. Unless otherwise specified, incidental thyroid , adrenal, renal lesions do not require dedicated imaging follow up. Additionally, any mentioned pulmonary nodules do not require dedicated imaging follow-up based on the Fleischner guidelines unless otherwise specified. Coronary calcifications are not identified unless otherwise specified. COMPARISON: 12/28/2022 FINDINGS: Regarding findings of the lung bases, please refer to the chest report. The liver contains a  2 subcentimeter probable cysts. The gallbladder is normal. Peripheral calcification along the spleen may be from prior trauma. The pancreas demonstrates fatty infiltration. The right adrenal is normal. The left adrenal may be surgically absent or atrophic. The right kidney is noted to contain a small hyperdense lesion possibly representing a hemorrhagic cyst. The left kidney is surgically absent. There is a 3.4 cm infrarenal abdominal aortic aneurysm. Scattered calcified atherosclerotic changes are present in the bladder is normal. The prostate is normal. There  is colonic diverticulosis. The appendix is normal. Large and small bowel loops are otherwise within normal limits. There is no free fluid or pathologic lymphadenopathy by size criteria. No concerning osseous lesions are identified. There is diffuse osseous demineralization. There are old compression fractures noted at T12 and L1. There are degenerative changes of the spine. IMPRESSION: No convincing evidence for metastatic disease within the abdomen or pelvis. 3.4 cm infrarenal abdominal aortic aneurysm. Recommend follow-up in 3 years. DOSE REDUCTION: This exam was performed according to our departmental dose-optimization program which includes automated exposure control, adjustment of the mA and/or kV according to patient size and/or use of iterative reconstruction technique. Electronically signed by: [place] Engel MD 06/30/2023 10:15 AM EDT RP Workstation: [id number]    Assessment & Plan:  Need for immunization against influenza -     Flu vaccine HIGH DOSE PF(Fluzone  Trivalent)  Thrombocytopenia- No B/B. -     CBC with Differential/Platelet; Future  Hyperlipidemia with target LDL less than 70- LDL goal achieved. Doing well on the statin  -     Lipid panel; Future -     TSH; Future  Pure hyperglyceridemia -     Lipid panel; Future  Hyponatremia- Stable, asx. -     Hemoglobin A1c; Future -     Basic metabolic panel with GFR; Future -      Urinalysis, Routine w reflex microscopic; Future -     Sodium, urine, random; Future -     TSH; Future  Chronic otitis externa of left ear, unspecified type -     Ambulatory referral to ENT -     Cipro HC; Place 3 drops into the left ear 2 (two) times daily.  Dispense: 10 mL; Refill: 1  Encounter for general adult medical examination with abnormal findings- Exam completed, labs reviewed, vaccines reviewed and updated, no cancer screenings indicated, pt ed material was given.      Follow-up: Return in about 6 months (around 06/20/2024).  Frank Molt, MD

## 2023-12-22 LAB — SODIUM, URINE, RANDOM: Sodium, Ur: 43 mmol/L (ref 28–272)

## 2023-12-22 MED ORDER — CIPRO HC 0.2-1 % OT SUSP: 3.0000 [drp] | Freq: Two times a day (BID) | OTIC | 1 refills | Status: AC

## 2023-12-24 ENCOUNTER — Ambulatory Visit: Payer: Self-pay | Admitting: Internal Medicine

## 2023-12-26 ENCOUNTER — Encounter (HOSPITAL_COMMUNITY): Payer: Self-pay

## 2023-12-26 ENCOUNTER — Inpatient Hospital Stay: Attending: Internal Medicine

## 2023-12-26 ENCOUNTER — Ambulatory Visit (HOSPITAL_COMMUNITY)
Admission: RE | Admit: 2023-12-26 | Discharge: 2023-12-26 | Disposition: A | Discharge status: Home / Self Care | Source: Ambulatory Visit | Attending: Internal Medicine | Admitting: Internal Medicine

## 2023-12-26 DIAGNOSIS — C7802 Secondary malignant neoplasm of left lung: Secondary | ICD-10-CM | POA: Diagnosis not present

## 2023-12-26 DIAGNOSIS — I7143 Infrarenal abdominal aortic aneurysm, without rupture: Secondary | ICD-10-CM | POA: Diagnosis not present

## 2023-12-26 DIAGNOSIS — C3490 Malignant neoplasm of unspecified part of unspecified bronchus or lung: Secondary | ICD-10-CM

## 2023-12-26 DIAGNOSIS — D693 Immune thrombocytopenic purpura: Secondary | ICD-10-CM | POA: Insufficient documentation

## 2023-12-26 DIAGNOSIS — C649 Malignant neoplasm of unspecified kidney, except renal pelvis: Secondary | ICD-10-CM | POA: Insufficient documentation

## 2023-12-26 DIAGNOSIS — C7801 Secondary malignant neoplasm of right lung: Secondary | ICD-10-CM | POA: Insufficient documentation

## 2023-12-26 DIAGNOSIS — Z905 Acquired absence of kidney: Secondary | ICD-10-CM | POA: Insufficient documentation

## 2023-12-26 LAB — CBC WITH DIFFERENTIAL (CANCER CENTER ONLY)
Abs Immature Granulocytes: 0.05 K/uL (ref 0.00–0.07)
Basophils Absolute: 0 K/uL (ref 0.0–0.1)
Basophils Relative: 0 %
Eosinophils Absolute: 0.1 K/uL (ref 0.0–0.5)
Eosinophils Relative: 2 %
HCT: 38.4 % — ABNORMAL LOW (ref 39.0–52.0)
Hemoglobin: 13.4 g/dL (ref 13.0–17.0)
Immature Granulocytes: 1 %
Lymphocytes Relative: 18 %
Lymphs Abs: 1.1 K/uL (ref 0.7–4.0)
MCH: 33.1 pg (ref 26.0–34.0)
MCHC: 34.9 g/dL (ref 30.0–36.0)
MCV: 94.8 fL (ref 80.0–100.0)
Monocytes Absolute: 0.5 K/uL (ref 0.1–1.0)
Monocytes Relative: 8 %
Neutro Abs: 4.3 K/uL (ref 1.7–7.7)
Neutrophils Relative %: 71 %
Platelet Count: 115 K/uL — ABNORMAL LOW (ref 150–400)
RBC: 4.05 MIL/uL — ABNORMAL LOW (ref 4.22–5.81)
RDW: 13.9 % (ref 11.5–15.5)
WBC Count: 6.1 K/uL (ref 4.0–10.5)
nRBC: 0 % (ref 0.0–0.2)

## 2023-12-26 LAB — CMP (CANCER CENTER ONLY)
ALT: 13 U/L (ref 0–44)
AST: 18 U/L (ref 15–41)
Albumin: 4.2 g/dL (ref 3.5–5.0)
Alkaline Phosphatase: 42 U/L (ref 38–126)
Anion gap: 5 (ref 5–15)
BUN: 17 mg/dL (ref 8–23)
CO2: 27 mmol/L (ref 22–32)
Calcium: 9.3 mg/dL (ref 8.9–10.3)
Chloride: 102 mmol/L (ref 98–111)
Creatinine: 1.2 mg/dL (ref 0.61–1.24)
GFR, Estimated: >60 mL/min (ref >60)
Glucose, Bld: 115 mg/dL — ABNORMAL HIGH (ref 70–99)
Potassium: 4.2 mmol/L (ref 3.5–5.1)
Sodium: 134 mmol/L — ABNORMAL LOW (ref 135–145)
Total Bilirubin: 0.6 mg/dL (ref 0.0–1.2)
Total Protein: 7.2 g/dL (ref 6.5–8.1)

## 2023-12-30 ENCOUNTER — Ambulatory Visit: Admitting: Internal Medicine

## 2024-01-01 NOTE — Progress Notes (Deleted)
 Ellouise Console, PA-C [address] [place], [place]  [address] Phone: [phone]   Primary Care Physician: [name] Debby CROME, MD  Primary Gastroenterologist:  Ellouise Console, PA-C / Norleen Kiang, MD   Chief Complaint:  ***       HPI:   Discussed the use of AI scribe software for clinical note transcription with the patient, who gave verbal consent to proceed.  Established patient Dr. Kiang.  Is here for 18-month follow-up of GERD.  He last saw Dr. Kiang 04/2023 for follow-up of GERD, peptic stricture, and adenomatous colon polyps.  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  He is taking Nexium  40 mg once daily with good control of acid reflux.  History of Present Illness   2018 last colonoscopy: 1 small 4 mm tubular adenoma polyp removed from distal descending colon.  Pandiverticulosis.  Internal hemorrhoids.  2013 last EGD: Normal.  Biopsies negative for Barrett's and H. pylori.  Current Outpatient Medications  Medication Sig Dispense Refill   acetaminophen  (TYLENOL ) 500 MG tablet Take 1,000 mg by mouth every 6 (six) hours as needed for mild pain.     Calcium  Carbonate (CALCIUM  500 PO) Take 500 mg by mouth 2 (two) times daily.     ciprofloxacin-hydrocortisone  (CIPRO HC) OTIC suspension Place 3 drops into the left ear 2 (two) times daily. 10 mL 1   denosumab  (PROLIA ) 60 MG/ML SOSY injection Inject 60 mg into the skin every 6 (six) months.     esomeprazole  (NEXIUM ) 40 MG capsule Take 1 capsule (40 mg total) by mouth daily before breakfast. 90 capsule 3   fluticasone (FLONASE) 50 MCG/ACT nasal spray Place into both nostrils daily.     metoprolol  succinate (TOPROL -XL) 25 MG 24 hr tablet TAKE 2 TABLETS BY MOUTH AT BEDTIME 90 tablet 2   Multiple Vitamins-Minerals (CENTRUM MULTI + OMEGA 3 PO) Take 1 tablet by mouth daily.      omega-3 acid ethyl esters (LOVAZA ) 1 g capsule Take 2 capsules by mouth twice daily 360 capsule 0    rosuvastatin  (CRESTOR ) 10 MG tablet Take 1 tablet by mouth once daily 90 tablet 0   solifenacin  (VESICARE ) 10 MG tablet TAKE 1 TABLET BY MOUTH AT BEDTIME 90 tablet 0   VITAMIN A PO Take by mouth daily.     VITAMIN D  PO Take 1,000 Units by mouth daily.     Current Facility-Administered Medications  Medication Dose Route Frequency Provider Last Rate Last Admin   denosumab  (PROLIA ) injection 60 mg  60 mg Subcutaneous Q6 months [name] Debby CROME, MD        Allergies as of 01/02/2024 - Review Complete 12/26/2023  Allergen Reaction Noted   Hydrocodone  Other (See Comments) 10/06/2018   Monosodium glutamate Other (See Comments) 04/24/2018   Naproxen  sodium  08/14/2018   Other  10/06/2018    Past Medical History:  Diagnosis Date   Abdominal aortic aneurysm (AAA) without rupture 06/23/2018   Age-related osteoporosis without current pathological fracture 07/14/2021   BPH (benign prostatic hyperplasia) 01/19/2010   Cervical radiculopathy 06/16/2018   Chronic anticoagulation 08/22/2018   Chronic bilateral low back pain without sciatica 07/18/2019   Chronic sinusitis 05/18/2021   Diverticulosis    Erectile dysfunction due to arterial insufficiency 04/23/2015   Esophageal stricture    Essential hypertension 05/01/2018   Estimated Creatinine Clearance: 44.1 mL/min (by C-G formula based on SCr of 1.35 mg/dL).   GERD (gastroesophageal reflux disease) 11/10/2010  Hearing loss    Hiatal hernia    Hyperlipidemia with target LDL less than 70 11/28/2012   The 10-year ASCVD risk score Verdon DC Jr., et al., 2013) is: 30.7%   Values used to calculate the score:     Age: 31 years     Sex: Male     Is Non-Hispanic African American: No     Diabetic: No     Tobacco smoker: No     Systolic Blood Pressure: 132 mmHg     Is BP treated: No     HDL Cholesterol: 30.8 mg/dL     Total Cholesterol: 183 mg/dL   Hypoglycemia    Iron deficiency anemia due to dietary causes 12/07/2018   Lung nodule seen on imaging study  06/23/2018   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   Estimated Creatinine Clearance: 51.7 mL/min (by C-G formula based on SCr of 1.15 mg/dL).   Syncope    presented with atrial fib converted with Diltiazem    Systemic inflammatory response syndrome 06/26/2018   Thrombocytopenia 01/19/2021    Past Surgical History:  Procedure Laterality Date   CYSTOSCOPY WITH URETEROSCOPY AND STENT PLACEMENT Left 08/16/2018   Procedure: CYSTOSCOPY WITH LEFT RETROGRADE PYELOGRAM, BALLOON DILATION LEFT URETER, STENT PLACEMENT;  Surgeon: Carolee Sherwood JONETTA DOUGLAS, MD;  Location: [facility];  Service: Urology;  Laterality: Left;   HERNIA REPAIR     INSERT / REPLACE / REMOVE PACEMAKER     LAPAROSCOPIC ASSISTED VENTRAL HERNIA REPAIR N/A 10/18/2018   Procedure: LAPAROSCOPIC ASSISTED VENTRAL WALL HERNIA REPAIR WITH MESH;  Surgeon: Sheldon Standing, MD;  Location: [facility];  Service: General;  Laterality: N/A;   LAPAROSCOPIC NEPHRECTOMY, HAND ASSISTED Left 10/18/2018   Procedure: LEFT HAND ASSISTED LAPAROSCOPIC RADICAL NEPHRECTOMY WITH ADRENALECTOMY;  Surgeon: Carolee Sherwood JONETTA DOUGLAS, MD;  Location: [facility];  Service: Urology;  Laterality: Left;   LOOP RECORDER INSERTION N/A 04/26/2018   Procedure: LOOP RECORDER INSERTION;  Surgeon: Kelsie Agent, MD;  Location: [facility];  Service: Cardiovascular;   Laterality: N/A;   LOOP RECORDER REMOVAL N/A 07/18/2018   Procedure: LOOP RECORDER REMOVAL;  Surgeon: Kelsie Agent, MD;  Location: [facility];  Service: Cardiovascular;  Laterality: N/A;   NASAL SEPTUM SURGERY     PACEMAKER IMPLANT N/A 07/18/2018   Procedure: PACEMAKER IMPLANT;  Surgeon: Kelsie Agent, MD;  Location: [facility];  Service: Cardiovascular;  Laterality: N/A;   SKIN BIOPSY     TONSILLECTOMY     TONSILLECTOMY     UMBILICAL HERNIA REPAIR  1993   WISDOM TOOTH EXTRACTION     about 83 years old    Review of Systems:    All systems reviewed and negative except where noted in HPI.    Physical Exam:  There were no vitals taken for this visit. No LMP for male patient.  General: Well-nourished, well-developed  in no acute distress.  Lungs: Clear to auscultation bilaterally. Non-labored. Heart: Regular rate and rhythm, no murmurs rubs or gallops.  Abdomen: Bowel sounds are normal; Abdomen is Soft; No hepatosplenomegaly, masses or hernias;  No Abdominal Tenderness; No guarding or rebound tenderness. Neuro: Alert and oriented x 3.  Grossly intact.  Psych: Alert and cooperative, normal mood and affect.   Imaging Studies: CT Chest Wo Contrast Result Date: 12/29/2023 EXAM: CT CHEST, ABDOMEN AND PELVIS WITHOUT CONTRAST 12/26/2023 01:45:00 PM TECHNIQUE: CT of the chest, abdomen and pelvis was performed without the administration of intravenous contrast. Multiplanar reformatted images are provided for review. Automated exposure control, iterative reconstruction, and/or weight based adjustment of the mA/kV was utilized to reduce the radiation dose to as low as reasonably achievable. COMPARISON: Multiple imaging studies, the most recent examination is 06/27/2023. CLINICAL HISTORY: Non-small cell lung cancer (NSCLC), staging. 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 He initially  presented with localized disease in 2020. 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 He presented with a platelet count of less than 5 and subdural hematoma. He had a complete response to IVIG and dexamethasone . ; ; PRIOR THERAPY:; 1) Status post left radical nephrectomy completed by Dr. Carolee on October 18, 2018. He was found to have clear-cell histology with sarcomatoid features.; 2) Ipilimumab  1 mg/kg and nivolumab  3 mg/kg started on October 01, 2021. He completed 2 cycles of therapy and currently on hold due to autoimmune complications predominantly mucositis and pharyngitis.; ; CURRENT THERAPY: Observation.; No c/o FINDINGS: CHEST: MEDIASTINUM AND LYMPH NODES: Mild stable cardiac enlargement. No pericardial effusion. Stable tortuosity and calcification of the thoracic aorta. Stable 3-vessel coronary artery calcifications. Stable pacer wires. No mediastinal or hilar lymphadenopathy. Small scattered lymph nodes are stable. The esophagus is grossly normal. Stable small hiatal hernia. The central airways are clear. LUNGS AND PLEURA: Numerous metastatic pulmonary nodules again demonstrated in both lungs with slight interval increase in size. Left upper lobe nodule on image 71/6 measures 9 mm and previously measured 6 mm. Left lower lobe nodule on image 95/6 measures 7 mm and previously measured 5 mm. Left lower lobe nodule on image 94/6 measures 9 mm and previously measured 7 mm. Right middle lobe nodule on image 115/6 measures 13 mm and previously measured 12 mm. Several other bilateral pulmonary nodules are stable. No definite new pulmonary nodules are identified. Stable background of severe emphysematous changes and pulmonary scarring. No pleural effusions or pleural lesions. No pneumothorax. ABDOMEN AND PELVIS: LIVER: No hepatic lesions are identified without contrast. No Intrahepatic biliary dilatation. GALLBLADDER AND BILE DUCTS: The gallbladder is unremarkable. No common bile duct dilatation. SPLEEN:  The spleen is normal in size. No focal lesions are identified. PANCREAS: Stable significant atrophy of the pancreas. ADRENAL GLANDS: Status post left adrenalectomy. Normal right adrenal gland. KIDNEYS, URETERS AND BLADDER: Status post left nephrectomy. No findings for recurrent tumor in the nephrectomy bed. There is an enlarging hyperdense lesion involving the mid pole region of the right kidney posteriorly medially, measuring approximately 2.3 cm and on the prior study measured 1.6 cm. It's possible this is a hyperdense or hemorrhagic cyst, but a solid renal mass cannot be excluded. If this patient can have contrast, a repeat CT scan with renal mass protocol is suggested; otherwise, noncontrast MRI may be helpful. No stones in the kidneys or ureters. No hydronephrosis. No perinephric or periureteral stranding. Urinary bladder is unremarkable. GI AND  BOWEL: The stomach, duodenum, small bowel, and colon are unremarkable. No acute inflammatory process, mass lesions, or obstructive findings. Moderate stool burden. Stable colonic diverticulosis. REPRODUCTIVE ORGANS: The prostate gland and seminal vesicles are unremarkable. PERITONEUM AND RETROPERITONEUM: No ascites. No free air. No mesenteric or retroperitoneal mass or adenopathy. No pelvic adenopathy or free pelvic fluid collections. VASCULATURE: Stable atherosclerotic calcifications involving the aorta and branch vessels. Unchanged 3 cm infrarenal abdominal aortic aneurysm. ABDOMINAL AND PELVIS LYMPH NODES: No mesenteric or retroperitoneal mass or adenopathy. No pelvic adenopathy. BONES AND SOFT TISSUES: Bony structures are intact. No worrisome lytic or sclerotic bone lesions. Stable T12 and L1 compression fractures with vertebral plana appearance. Small anterior abdominal wall hernia. No inguinal mass or hernia. No focal soft tissue abnormality. IMPRESSION: 1. Numerous bilateral pulmonary metastases with mild interval growth of several nodules, without definite new  nodules. 2. Enlarging right renal lesion indeterminate for hemorrhagic cyst versus solid mass; recommend renal mass protocol CT with contrast if feasible, or noncontrast MRI if contrast is contraindicated. 3. Infrarenal abdominal aortic aneurysm measuring 3.0 cm; recommend ultrasound follow-up in 3 years. Electronically signed by: Maude Stammer MD 12/29/2023 04:52 PM EDT RP Workstation: [id number]   CT ABDOMEN PELVIS WO CONTRAST Result Date: 12/29/2023 EXAM: CT CHEST, ABDOMEN AND PELVIS WITHOUT CONTRAST 12/26/2023 01:45:00 PM TECHNIQUE: CT of the chest, abdomen and pelvis was performed without the administration of intravenous contrast. Multiplanar reformatted images are provided for review. Automated exposure control, iterative reconstruction, and/or weight based adjustment of the mA/kV was utilized to reduce the radiation dose to as low as reasonably achievable. COMPARISON: Multiple imaging studies, the most recent examination is 06/27/2023. CLINICAL HISTORY: Non-small cell lung cancer (NSCLC), staging. 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 He initially presented with localized disease in 2020. 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 He presented with a platelet count of less than 5 and subdural hematoma. He had a complete response to IVIG and dexamethasone . ; ; PRIOR THERAPY:; 1) Status post left radical nephrectomy completed by Dr. Carolee on October 18, 2018. He was found to have clear-cell histology with sarcomatoid features.; 2) Ipilimumab  1 mg/kg and nivolumab  3 mg/kg started on October 01, 2021. He completed 2 cycles of therapy and currently on hold due to autoimmune complications predominantly mucositis and pharyngitis.; ; CURRENT THERAPY: Observation.; No c/o FINDINGS: CHEST: MEDIASTINUM AND LYMPH NODES: Mild stable cardiac enlargement. No pericardial effusion. Stable tortuosity and calcification of  the thoracic aorta. Stable 3-vessel coronary artery calcifications. Stable pacer wires. No mediastinal or hilar lymphadenopathy. Small scattered lymph nodes are stable. The esophagus is grossly normal. Stable small hiatal hernia. The central airways are clear. LUNGS AND PLEURA: Numerous metastatic pulmonary nodules again demonstrated in both lungs with slight interval increase in size. Left upper lobe nodule on image 71/6 measures 9 mm and previously measured 6 mm. Left lower lobe nodule on image 95/6 measures 7 mm and previously measured 5 mm. Left lower lobe nodule on image 94/6 measures 9 mm and previously measured 7 mm. Right middle lobe nodule on image 115/6 measures 13 mm and previously measured 12 mm. Several other bilateral pulmonary nodules are stable. No definite new pulmonary nodules are identified. Stable background of severe emphysematous changes and pulmonary scarring. No pleural effusions or pleural lesions. No pneumothorax. ABDOMEN AND PELVIS: LIVER: No hepatic lesions are identified without contrast. No Intrahepatic biliary dilatation. GALLBLADDER AND BILE DUCTS: The gallbladder is unremarkable.  No common bile duct dilatation. SPLEEN: The spleen is normal in size. No focal lesions are identified. PANCREAS: Stable significant atrophy of the pancreas. ADRENAL GLANDS: Status post left adrenalectomy. Normal right adrenal gland. KIDNEYS, URETERS AND BLADDER: Status post left nephrectomy. No findings for recurrent tumor in the nephrectomy bed. There is an enlarging hyperdense lesion involving the mid pole region of the right kidney posteriorly medially, measuring approximately 2.3 cm and on the prior study measured 1.6 cm. It's possible this is a hyperdense or hemorrhagic cyst, but a solid renal mass cannot be excluded. If this patient can have contrast, a repeat CT scan with renal mass protocol is suggested; otherwise, noncontrast MRI may be helpful. No stones in the kidneys or ureters. No  hydronephrosis. No perinephric or periureteral stranding. Urinary bladder is unremarkable. GI AND BOWEL: The stomach, duodenum, small bowel, and colon are unremarkable. No acute inflammatory process, mass lesions, or obstructive findings. Moderate stool burden. Stable colonic diverticulosis. REPRODUCTIVE ORGANS: The prostate gland and seminal vesicles are unremarkable. PERITONEUM AND RETROPERITONEUM: No ascites. No free air. No mesenteric or retroperitoneal mass or adenopathy. No pelvic adenopathy or free pelvic fluid collections. VASCULATURE: Stable atherosclerotic calcifications involving the aorta and branch vessels. Unchanged 3 cm infrarenal abdominal aortic aneurysm. ABDOMINAL AND PELVIS LYMPH NODES: No mesenteric or retroperitoneal mass or adenopathy. No pelvic adenopathy. BONES AND SOFT TISSUES: Bony structures are intact. No worrisome lytic or sclerotic bone lesions. Stable T12 and L1 compression fractures with vertebral plana appearance. Small anterior abdominal wall hernia. No inguinal mass or hernia. No focal soft tissue abnormality. IMPRESSION: 1. Numerous bilateral pulmonary metastases with mild interval growth of several nodules, without definite new nodules. 2. Enlarging right renal lesion indeterminate for hemorrhagic cyst versus solid mass; recommend renal mass protocol CT with contrast if feasible, or noncontrast MRI if contrast is contraindicated. 3. Infrarenal abdominal aortic aneurysm measuring 3.0 cm; recommend ultrasound follow-up in 3 years. Electronically signed by: Maude Stammer MD 12/29/2023 04:52 PM EDT RP Workstation: [id number]    Labs: CBC    Component Value Date/Time   WBC 6.1 12/26/2023 1254   WBC 7.7 12/21/2023 1024   RBC 4.05 (L) 12/26/2023 1254   HGB 13.4 12/26/2023 1254   HGB 13.5 07/21/2020 1211   HGB 15.1 11/09/2013 0000   HCT 38.4 (L) 12/26/2023 1254   HCT 37.9 07/21/2020 1211   HCT 44 11/09/2013 0000   PLT 115 (L) 12/26/2023 1254   PLT 110 (L) 07/21/2020  1211   MCV 94.8 12/26/2023 1254   MCV 94 07/21/2020 1211   MCH 33.1 12/26/2023 1254   MCHC 34.9 12/26/2023 1254   RDW 13.9 12/26/2023 1254   RDW 13.2 07/21/2020 1211   LYMPHSABS 1.1 12/26/2023 1254   MONOABS 0.5 12/26/2023 1254   EOSABS 0.1 12/26/2023 1254   BASOSABS 0.0 12/26/2023 1254    CMP     Component Value Date/Time   NA 134 (L) 12/26/2023 1254   NA 135 07/21/2020 1211   K 4.2 12/26/2023 1254   K 3.8 11/09/2013 0000   CL 102 12/26/2023 1254   CL 105 11/09/2013 0000   CO2 27 12/26/2023 1254   CO2 26 11/09/2013 0000   GLUCOSE 115 (H) 12/26/2023 1254   BUN 17 12/26/2023 1254   BUN 18 07/21/2020 1211   CREATININE 1.20 12/26/2023 1254   CREATININE 0.66 11/09/2013 0000   CALCIUM  9.3 12/26/2023 1254   CALCIUM  8.7 11/09/2013 0000   PROT 7.2 12/26/2023 1254   PROT 6.4 10/12/2017 0729  ALBUMIN  4.2 12/26/2023 1254   ALBUMIN  4.2 10/12/2017 0729   AST 18 12/26/2023 1254   ALT 13 12/26/2023 1254   ALKPHOS 42 12/26/2023 1254   ALKPHOS 55 11/09/2013 0000   BILITOT 0.6 12/26/2023 1254   GFRNONAA >60 12/26/2023 1254   GFRAA >60 10/20/2018 0447       Assessment and Plan:   Lora Glomski. is a 83 y.o. y/o male ***  Assessment and Plan Assessment & Plan       Ellouise Console, PA-C  Follow up ***

## 2024-01-02 ENCOUNTER — Ambulatory Visit: Admitting: Physician Assistant

## 2024-01-02 ENCOUNTER — Telehealth: Payer: Self-pay | Admitting: Internal Medicine

## 2024-01-02 ENCOUNTER — Inpatient Hospital Stay: Attending: Internal Medicine | Admitting: Internal Medicine

## 2024-01-02 VITALS — BP 121/68 | HR 84 | Temp 97.6°F | Resp 17 | Ht 64.0 in | Wt 193.0 lb

## 2024-01-02 DIAGNOSIS — C7800 Secondary malignant neoplasm of unspecified lung: Secondary | ICD-10-CM | POA: Diagnosis not present

## 2024-01-02 DIAGNOSIS — Z905 Acquired absence of kidney: Secondary | ICD-10-CM | POA: Diagnosis not present

## 2024-01-02 DIAGNOSIS — C649 Malignant neoplasm of unspecified kidney, except renal pelvis: Secondary | ICD-10-CM | POA: Diagnosis present

## 2024-01-02 DIAGNOSIS — I129 Hypertensive chronic kidney disease with stage 1 through stage 4 chronic kidney disease, or unspecified chronic kidney disease: Secondary | ICD-10-CM | POA: Diagnosis not present

## 2024-01-02 DIAGNOSIS — D693 Immune thrombocytopenic purpura: Secondary | ICD-10-CM | POA: Diagnosis not present

## 2024-01-02 DIAGNOSIS — Z9226 Personal history of immune checkpoint inhibitor therapy: Secondary | ICD-10-CM | POA: Diagnosis not present

## 2024-01-02 DIAGNOSIS — N1832 Chronic kidney disease, stage 3b: Secondary | ICD-10-CM | POA: Insufficient documentation

## 2024-01-02 DIAGNOSIS — E781 Pure hyperglyceridemia: Secondary | ICD-10-CM | POA: Insufficient documentation

## 2024-01-02 DIAGNOSIS — Z7901 Long term (current) use of anticoagulants: Secondary | ICD-10-CM | POA: Insufficient documentation

## 2024-01-02 DIAGNOSIS — M810 Age-related osteoporosis without current pathological fracture: Secondary | ICD-10-CM | POA: Insufficient documentation

## 2024-01-02 DIAGNOSIS — Z79899 Other long term (current) drug therapy: Secondary | ICD-10-CM | POA: Diagnosis not present

## 2024-01-02 DIAGNOSIS — C642 Malignant neoplasm of left kidney, except renal pelvis: Secondary | ICD-10-CM

## 2024-01-02 MED ORDER — ESOMEPRAZOLE MAGNESIUM 40 MG PO CPDR: 40.0000 mg | DELAYED_RELEASE_CAPSULE | Freq: Every day | ORAL | 3 refills | Status: DC

## 2024-01-02 NOTE — Telephone Encounter (Signed)
 Prescription sent to patient's pharmacy.

## 2024-01-02 NOTE — Progress Notes (Signed)
 [facility] [phone]   [phone]  OFFICE PROGRESS NOTE  Frank Frank CROME, MD [address] 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  He initially presented with localized disease in 2020.  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  He presented with a platelet count of less than 5 and subdural hematoma.  He had a complete response to IVIG and dexamethasone .    PRIOR THERAPY: 1) Status post left radical nephrectomy completed by Dr. Carolee on October 18, 2018.  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  2) Ipilimumab  1 mg/kg and nivolumab  3 mg/kg started on October 01, 2021.  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   CURRENT THERAPY: Observation.  INTERVAL HISTORY: [name] 83 y.o. male returns to the clinic today for follow-up visit accompanied by his wife. Discussed the use of AI scribe software for clinical note transcription with the patient, who gave verbal consent to proceed.  History of Present Illness [name] Frank [name] is an 83 year old male with stage four clear cell renal cell carcinoma with sarcomatoid feature and pulmonary metastasis who presents for evaluation with repeat CT scan of the chest. He is accompanied by his wife.  Initially diagnosed with localized renal cell carcinoma in 2020, he underwent a left radical nephrectomy in August 2020. Post-surgery, he received two cycles of immunotherapy with ibrutinib and nivolumab , which were discontinued due to autoimmune complications, including mucositis and pharyngitis. Since then, he has been under observation.  He experiences occasional dizziness, describing it as 'a bunch of thoughts go through my head and chest went to war,' but he  is able to walk and manage these episodes. No new chest pain, breathing issues, nausea, vomiting, diarrhea, or headaches.  He notes leg weakness, which he attributes to needing better shoes.  He has a history of immune thrombocytopenic purpura (ITP).  He reports a dry mouth and sticky lips, which he attributes to previous treatments. He uses topical treatments to manage these symptoms.  He mentions having only one kidney and is aware of a cyst in the remaining kidney, but no specific symptoms related to this were discussed.    MEDICAL HISTORY: Past Medical History:  Diagnosis Date   Abdominal aortic aneurysm (AAA) without rupture 06/23/2018   Age-related osteoporosis without current pathological fracture 07/14/2021   BPH (benign prostatic hyperplasia) 01/19/2010   Cervical radiculopathy 06/16/2018   Chronic anticoagulation 08/22/2018   Chronic bilateral low back pain without sciatica 07/18/2019   Chronic sinusitis 05/18/2021   Diverticulosis    Erectile dysfunction due to arterial insufficiency 04/23/2015   Esophageal stricture    Essential hypertension 05/01/2018   Estimated Creatinine Clearance: 44.1 mL/min (by C-G formula based on SCr of 1.35 mg/dL).   GERD (gastroesophageal reflux disease) 11/10/2010   Hearing loss    Hiatal hernia    Hyperlipidemia with target LDL less than 70 11/28/2012   The 10-year ASCVD risk score Verdon DC Jr., et al., 2013) is: 30.7%   Values used to calculate the score:     Age: 34 years     Sex: Male     Is Non-Hispanic African American: No     Diabetic: No     Tobacco smoker: No  Systolic Blood Pressure: 132 mmHg     Is BP treated: No     HDL Cholesterol: 30.8 mg/dL     Total Cholesterol: 183 mg/dL   Hypoglycemia    Iron deficiency anemia due to dietary causes 12/07/2018   Lung nodule seen on imaging study 06/23/2018   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 08/22/2018    a Medtronic Azure XT MRI conditional dual-chamber  pacemaker for symptomatic sinus pauses, sick sinus syndrome, and syncope   Migraine headache    OAB (overactive bladder) 01/06/2016   PAF (paroxysmal atrial fibrillation)    Echo with normal LV function and mild LVH   Prediabetes 12/06/2018   Pure hyperglyceridemia 11/22/2007   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   Estimated Creatinine Clearance: 51.7 mL/min (by C-G formula based on SCr of 1.15 mg/dL).   Syncope    presented with atrial fib converted with Diltiazem    Systemic inflammatory response syndrome 06/26/2018   Thrombocytopenia 01/19/2021    ALLERGIES:  is allergic to hydrocodone , monosodium glutamate, naproxen  sodium, and other.  MEDICATIONS:  Current Outpatient Medications  Medication Sig Dispense Refill   acetaminophen  (TYLENOL ) 500 MG tablet Take 1,000 mg by mouth every 6 (six) hours as needed for mild pain.     Calcium  Carbonate (CALCIUM  500 PO) Take 500 mg by mouth 2 (two) times daily.     ciprofloxacin-hydrocortisone  (CIPRO HC) OTIC suspension Place 3 drops into the left ear 2 (two) times daily. 10 mL 1   denosumab  (PROLIA ) 60 MG/ML SOSY injection Inject 60 mg into the skin every 6 (six) months.     esomeprazole  (NEXIUM ) 40 MG capsule Take 1 capsule (40 mg total) by mouth daily before breakfast. 90 capsule 3   fluticasone (FLONASE) 50 MCG/ACT nasal spray Place into both nostrils daily.     metoprolol  succinate (TOPROL -XL) 25 MG 24 hr tablet TAKE 2 TABLETS BY MOUTH AT BEDTIME 90 tablet 2   Multiple Vitamins-Minerals (CENTRUM MULTI + OMEGA 3 PO) Take 1 tablet by mouth daily.      omega-3 acid ethyl esters (LOVAZA ) 1 g capsule Take 2 capsules by mouth twice daily 360 capsule 0   rosuvastatin  (CRESTOR ) 10 MG tablet Take 1 tablet by mouth once daily  90 tablet 0   solifenacin  (VESICARE ) 10 MG tablet TAKE 1 TABLET BY MOUTH AT BEDTIME 90 tablet 0   VITAMIN A PO Take by mouth daily.     VITAMIN D  PO Take 1,000 Units by mouth daily.     Current Facility-Administered Medications  Medication Dose Route Frequency Provider Last Rate Last Admin   denosumab  (PROLIA ) injection 60 mg  60 mg Subcutaneous Q6 months Frank Frank CROME, MD        SURGICAL HISTORY:  Past Surgical History:  Procedure Laterality Date   CYSTOSCOPY WITH URETEROSCOPY AND STENT PLACEMENT Left 08/16/2018   Procedure: CYSTOSCOPY WITH LEFT RETROGRADE PYELOGRAM, BALLOON DILATION LEFT URETER, STENT PLACEMENT;  Surgeon: Carolee Sherwood JONETTA DOUGLAS, MD;  Location: [facility];  Service: Urology;  Laterality: Left;   HERNIA REPAIR     INSERT / REPLACE / REMOVE PACEMAKER     LAPAROSCOPIC ASSISTED VENTRAL HERNIA REPAIR N/A 10/18/2018   Procedure: LAPAROSCOPIC ASSISTED VENTRAL WALL HERNIA REPAIR WITH MESH;  Surgeon: Sheldon Standing,  MD;  Location: [facility];  Service: General;  Laterality: N/A;   LAPAROSCOPIC NEPHRECTOMY, HAND ASSISTED Left 10/18/2018   Procedure: LEFT HAND ASSISTED LAPAROSCOPIC RADICAL NEPHRECTOMY WITH ADRENALECTOMY;  Surgeon: Carolee Sherwood JONETTA DOUGLAS, MD;  Location: [facility];  Service: Urology;  Laterality: Left;   LOOP RECORDER INSERTION N/A 04/26/2018   Procedure: LOOP RECORDER INSERTION;  Surgeon: Kelsie Agent, MD;  Location: [facility];  Service: Cardiovascular;  Laterality: N/A;   LOOP RECORDER REMOVAL N/A 07/18/2018   Procedure: LOOP RECORDER REMOVAL;  Surgeon: Kelsie Agent, MD;  Location: [facility];  Service: Cardiovascular;  Laterality: N/A;   NASAL SEPTUM SURGERY     PACEMAKER IMPLANT N/A 07/18/2018   Procedure: PACEMAKER IMPLANT;  Surgeon: Kelsie Agent, MD;  Location: [facility];  Service: Cardiovascular;  Laterality: N/A;   SKIN BIOPSY     TONSILLECTOMY     TONSILLECTOMY     UMBILICAL HERNIA REPAIR  1993   WISDOM TOOTH EXTRACTION     about 83 years old     REVIEW OF SYSTEMS:  Constitutional: positive for fatigue Eyes: negative Ears, nose, mouth, throat, and face: positive for dry mouth Respiratory: negative Cardiovascular: negative Gastrointestinal: negative Genitourinary:negative Integument/breast: negative Hematologic/lymphatic: negative Musculoskeletal:negative Neurological: negative Behavioral/Psych: negative Endocrine: negative Allergic/Immunologic: negative   PHYSICAL EXAMINATION: General appearance: alert, cooperative, and fatigued Head: Normocephalic, without obvious abnormality, atraumatic Neck: no adenopathy, no JVD, supple, symmetrical, trachea midline, and thyroid  not enlarged, symmetric, no tenderness/mass/nodules Lymph nodes: Cervical, supraclavicular, and axillary nodes normal. Resp: clear to auscultation bilaterally and normal percussion bilaterally Back: symmetric, no curvature. ROM normal. No CVA tenderness. Cardio: regular rate and rhythm, S1, S2 normal, no murmur, click, rub or gallop GI: soft, non-tender; bowel sounds normal; no masses,  no organomegaly Extremities: extremities normal, atraumatic, no cyanosis or edema Neurologic: Alert and oriented X 3, normal strength and tone. Normal symmetric reflexes. Normal coordination and gait  ECOG PERFORMANCE STATUS: 1 - Symptomatic but completely ambulatory  Blood pressure 121/68, pulse 84, temperature 97.6 F (36.4 C), temperature source Temporal, resp. rate 17, height 5' 4 (1.626 m), weight 193 lb (87.5 kg), SpO2 99%.  LABORATORY DATA: Lab Results  Component Value Date   WBC 6.1 12/26/2023   HGB 13.4 12/26/2023   HCT 38.4 (L) 12/26/2023   MCV 94.8 12/26/2023   PLT 115 (L) 12/26/2023      Chemistry      Component Value Date/Time   NA 134 (L) 12/26/2023 1254   NA 135 07/21/2020 1211   K 4.2 12/26/2023 1254   K 3.8 11/09/2013 0000   CL 102 12/26/2023 1254   CL 105 11/09/2013 0000   CO2 27 12/26/2023 1254   CO2 26 11/09/2013 0000   BUN 17  12/26/2023 1254   BUN 18 07/21/2020 1211   CREATININE 1.20 12/26/2023 1254   CREATININE 0.66 11/09/2013 0000      Component Value Date/Time   CALCIUM  9.3 12/26/2023 1254   CALCIUM  8.7 11/09/2013 0000   ALKPHOS 42 12/26/2023 1254   ALKPHOS 55 11/09/2013 0000   AST 18 12/26/2023 1254   ALT 13 12/26/2023 1254   BILITOT 0.6 12/26/2023 1254       RADIOGRAPHIC STUDIES: CT Chest Wo Contrast Result Date: 12/29/2023 EXAM: CT CHEST, ABDOMEN AND PELVIS WITHOUT CONTRAST 12/26/2023 01:45:00 PM TECHNIQUE: CT of the chest, abdomen and pelvis was performed without the administration of intravenous contrast. Multiplanar reformatted images are provided for review. Automated exposure control, iterative reconstruction, and/or weight based  adjustment of the mA/kV was utilized to reduce the radiation dose to as low as reasonably achievable. COMPARISON: Multiple imaging studies, the most recent examination is 06/27/2023. CLINICAL HISTORY: Non-small cell lung cancer (NSCLC), staging. 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 He initially presented with localized disease in 2020. 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 He presented with a platelet count of less than 5 and subdural hematoma. He had a complete response to IVIG and dexamethasone . ; ; PRIOR THERAPY:; 1) Status post left radical nephrectomy completed by Dr. Carolee on October 18, 2018. He was found to have clear-cell histology with sarcomatoid features.; 2) Ipilimumab  1 mg/kg and nivolumab  3 mg/kg started on October 01, 2021. He completed 2 cycles of therapy and currently on hold due to autoimmune complications predominantly mucositis and pharyngitis.; ; CURRENT THERAPY: Observation.; No c/o FINDINGS: CHEST: MEDIASTINUM AND LYMPH NODES: Mild stable cardiac enlargement. No pericardial effusion. Stable tortuosity and calcification of the thoracic aorta. Stable  3-vessel coronary artery calcifications. Stable pacer wires. No mediastinal or hilar lymphadenopathy. Small scattered lymph nodes are stable. The esophagus is grossly normal. Stable small hiatal hernia. The central airways are clear. LUNGS AND PLEURA: Numerous metastatic pulmonary nodules again demonstrated in both lungs with slight interval increase in size. Left upper lobe nodule on image 71/6 measures 9 mm and previously measured 6 mm. Left lower lobe nodule on image 95/6 measures 7 mm and previously measured 5 mm. Left lower lobe nodule on image 94/6 measures 9 mm and previously measured 7 mm. Right middle lobe nodule on image 115/6 measures 13 mm and previously measured 12 mm. Several other bilateral pulmonary nodules are stable. No definite new pulmonary nodules are identified. Stable background of severe emphysematous changes and pulmonary scarring. No pleural effusions or pleural lesions. No pneumothorax. ABDOMEN AND PELVIS: LIVER: No hepatic lesions are identified without contrast. No Intrahepatic biliary dilatation. GALLBLADDER AND BILE DUCTS: The gallbladder is unremarkable. No common bile duct dilatation. SPLEEN: The spleen is normal in size. No focal lesions are identified. PANCREAS: Stable significant atrophy of the pancreas. ADRENAL GLANDS: Status post left adrenalectomy. Normal right adrenal gland. KIDNEYS, URETERS AND BLADDER: Status post left nephrectomy. No findings for recurrent tumor in the nephrectomy bed. There is an enlarging hyperdense lesion involving the mid pole region of the right kidney posteriorly medially, measuring approximately 2.3 cm and on the prior study measured 1.6 cm. It's possible this is a hyperdense or hemorrhagic cyst, but a solid renal mass cannot be excluded. If this patient can have contrast, a repeat CT scan with renal mass protocol is suggested; otherwise, noncontrast MRI may be helpful. No stones in the kidneys or ureters. No hydronephrosis. No perinephric or  periureteral stranding. Urinary bladder is unremarkable. GI AND BOWEL: The stomach, duodenum, small bowel, and colon are unremarkable. No acute inflammatory process, mass lesions, or obstructive findings. Moderate stool burden. Stable colonic diverticulosis. REPRODUCTIVE ORGANS: The prostate gland and seminal vesicles are unremarkable. PERITONEUM AND RETROPERITONEUM: No ascites. No free air. No mesenteric or retroperitoneal mass or adenopathy. No pelvic adenopathy or free pelvic fluid collections. VASCULATURE: Stable atherosclerotic calcifications involving the aorta and branch vessels. Unchanged 3 cm infrarenal abdominal aortic aneurysm. ABDOMINAL AND PELVIS LYMPH NODES: No mesenteric or retroperitoneal mass or adenopathy. No pelvic adenopathy. BONES AND SOFT TISSUES: Bony structures are intact. No worrisome lytic or sclerotic bone lesions. Stable T12 and L1 compression fractures with vertebral plana appearance.  Small anterior abdominal wall hernia. No inguinal mass or hernia. No focal soft tissue abnormality. IMPRESSION: 1. Numerous bilateral pulmonary metastases with mild interval growth of several nodules, without definite new nodules. 2. Enlarging right renal lesion indeterminate for hemorrhagic cyst versus solid mass; recommend renal mass protocol CT with contrast if feasible, or noncontrast MRI if contrast is contraindicated. 3. Infrarenal abdominal aortic aneurysm measuring 3.0 cm; recommend ultrasound follow-up in 3 years. Electronically signed by: Maude Stammer MD 12/29/2023 04:52 PM EDT RP Workstation: [id number]   CT ABDOMEN PELVIS WO CONTRAST Result Date: 12/29/2023 EXAM: CT CHEST, ABDOMEN AND PELVIS WITHOUT CONTRAST 12/26/2023 01:45:00 PM TECHNIQUE: CT of the chest, abdomen and pelvis was performed without the administration of intravenous contrast. Multiplanar reformatted images are provided for review. Automated exposure control, iterative reconstruction, and/or weight based adjustment of the  mA/kV was utilized to reduce the radiation dose to as low as reasonably achievable. COMPARISON: Multiple imaging studies, the most recent examination is 06/27/2023. CLINICAL HISTORY: Non-small cell lung cancer (NSCLC), staging. 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 He initially presented with localized disease in 2020. 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 He presented with a platelet count of less than 5 and subdural hematoma. He had a complete response to IVIG and dexamethasone . ; ; PRIOR THERAPY:; 1) Status post left radical nephrectomy completed by Dr. Carolee on October 18, 2018. He was found to have clear-cell histology with sarcomatoid features.; 2) Ipilimumab  1 mg/kg and nivolumab  3 mg/kg started on October 01, 2021. He completed 2 cycles of therapy and currently on hold due to autoimmune complications predominantly mucositis and pharyngitis.; ; CURRENT THERAPY: Observation.; No c/o FINDINGS: CHEST: MEDIASTINUM AND LYMPH NODES: Mild stable cardiac enlargement. No pericardial effusion. Stable tortuosity and calcification of the thoracic aorta. Stable 3-vessel coronary artery calcifications. Stable pacer wires. No mediastinal or hilar lymphadenopathy. Small scattered lymph nodes are stable. The esophagus is grossly normal. Stable small hiatal hernia. The central airways are clear. LUNGS AND PLEURA: Numerous metastatic pulmonary nodules again demonstrated in both lungs with slight interval increase in size. Left upper lobe nodule on image 71/6 measures 9 mm and previously measured 6 mm. Left lower lobe nodule on image 95/6 measures 7 mm and previously measured 5 mm. Left lower lobe nodule on image 94/6 measures 9 mm and previously measured 7 mm. Right middle lobe nodule on image 115/6 measures 13 mm and previously measured 12 mm. Several other bilateral pulmonary nodules are stable. No definite new pulmonary  nodules are identified. Stable background of severe emphysematous changes and pulmonary scarring. No pleural effusions or pleural lesions. No pneumothorax. ABDOMEN AND PELVIS: LIVER: No hepatic lesions are identified without contrast. No Intrahepatic biliary dilatation. GALLBLADDER AND BILE DUCTS: The gallbladder is unremarkable. No common bile duct dilatation. SPLEEN: The spleen is normal in size. No focal lesions are identified. PANCREAS: Stable significant atrophy of the pancreas. ADRENAL GLANDS: Status post left adrenalectomy. Normal right adrenal gland. KIDNEYS, URETERS AND BLADDER: Status post left nephrectomy. No findings for recurrent tumor in the nephrectomy bed. There is an enlarging hyperdense lesion involving the mid pole region of the right kidney posteriorly medially, measuring approximately 2.3 cm and on the prior study measured 1.6 cm. It's possible this is a hyperdense or hemorrhagic cyst, but a solid renal mass cannot be excluded. If this patient can have contrast, a repeat CT scan with renal mass protocol is suggested; otherwise, noncontrast  MRI may be helpful. No stones in the kidneys or ureters. No hydronephrosis. No perinephric or periureteral stranding. Urinary bladder is unremarkable. GI AND BOWEL: The stomach, duodenum, small bowel, and colon are unremarkable. No acute inflammatory process, mass lesions, or obstructive findings. Moderate stool burden. Stable colonic diverticulosis. REPRODUCTIVE ORGANS: The prostate gland and seminal vesicles are unremarkable. PERITONEUM AND RETROPERITONEUM: No ascites. No free air. No mesenteric or retroperitoneal mass or adenopathy. No pelvic adenopathy or free pelvic fluid collections. VASCULATURE: Stable atherosclerotic calcifications involving the aorta and branch vessels. Unchanged 3 cm infrarenal abdominal aortic aneurysm. ABDOMINAL AND PELVIS LYMPH NODES: No mesenteric or retroperitoneal mass or adenopathy. No pelvic adenopathy. BONES AND SOFT  TISSUES: Bony structures are intact. No worrisome lytic or sclerotic bone lesions. Stable T12 and L1 compression fractures with vertebral plana appearance. Small anterior abdominal wall hernia. No inguinal mass or hernia. No focal soft tissue abnormality. IMPRESSION: 1. Numerous bilateral pulmonary metastases with mild interval growth of several nodules, without definite new nodules. 2. Enlarging right renal lesion indeterminate for hemorrhagic cyst versus solid mass; recommend renal mass protocol CT with contrast if feasible, or noncontrast MRI if contrast is contraindicated. 3. Infrarenal abdominal aortic aneurysm measuring 3.0 cm; recommend ultrasound follow-up in 3 years. 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  He initially presented with localized disease in 2020.  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  He presented with a platelet count of less than 5 and subdural hematoma.  He had a complete response to IVIG and dexamethasone .  He is status post left radical nephrectomy completed by Dr. Carolee on October 18, 2018.  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  The patient was then treated with Ipilimumab  1 mg/kg and nivolumab  3 mg/kg started on October 01, 2021.  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 The patient is currently on observation. He had repeat CT scan of the chest, abdomen pelvis performed recently.  I personally independent reviewed the scan and discussed the result with the patient and his wife.  Unfortunately his scan showed evidence for disease progression with enlarging pulmonary nodules. 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 Previous immunotherapy with ibrutinib and nivolumab  discontinued due to autoimmune complications. Current options include observation or oral chemotherapy. He is hesitant due to previous side effects and is considering options. Discussed potential for oral chemotherapy to halt progression, with risks including side effects similar to previous treatments. He is aware of the potential for continued growth and eventual respiratory issues if untreated. - Provided after visit summary with information on cabozantinib for review. - Scheduled follow-up in six months unless he decides to start treatment sooner. - Discussed option of hospice care if he chooses not to pursue further treatment.  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 Topical treatments available to help manage this side effect.  Leg weakness and numbness, etiology unclear Reports leg weakness and numbness, unrelated to current cancer treatment or pulmonary metastases. Etiology remains unclear.  Cyst in remaining kidney,  under observation Cyst in the remaining kidney noted on imaging. No immediate intervention planned as it does not change the management of the cancer. - Recommended follow-up with urologist if desired. He was advised to call immediately if he has any concerning symptoms in the interval.  The patient voices understanding of current disease status and treatment options and is in agreement with the current care plan.  All questions were answered. The patient knows to call the clinic with any problems, questions or concerns. We can certainly see the patient much sooner if necessary.  The total time spent in the appointment was 30 minutes.  Disclaimer: This note was dictated with  voice recognition software. Similar sounding words can inadvertently be transcribed and may not be corrected upon review.

## 2024-01-02 NOTE — Telephone Encounter (Signed)
 Patient came in 20 minutes late for his appointment today and had to reschedule to 02/15/24.  He would like refills of his medications to last him until his next appointment.  Thank you.

## 2024-01-04 ENCOUNTER — Telehealth: Payer: Self-pay | Admitting: Internal Medicine

## 2024-01-04 NOTE — Telephone Encounter (Signed)
 Scheduled the patient for next appointments. Called and spoke with the patient, he is aware.

## 2024-01-05 ENCOUNTER — Ambulatory Visit (INDEPENDENT_AMBULATORY_CARE_PROVIDER_SITE_OTHER)

## 2024-01-05 VITALS — BP 124/74 | HR 83

## 2024-01-05 DIAGNOSIS — R0981 Nasal congestion: Secondary | ICD-10-CM

## 2024-01-05 DIAGNOSIS — H6192 Disorder of left external ear, unspecified: Secondary | ICD-10-CM

## 2024-01-05 NOTE — Progress Notes (Signed)
 Dear Dr. [name], Here is my assessment for our mutual patient, [name] Thank you for allowing me the opportunity to care for your patient. Please do not hesitate to contact me should you have any other questions. Sincerely, Dr. Hadassah Parody  Otolaryngology Clinic Note Referring provider: Dr. [name]:   Initial HPI (01/05/2024) Discussed the use of AI scribe software for clinical note transcription with the patient, who gave verbal consent to proceed.  History of Present Illness [name] Frank [name] is an 83 year old male who presents with left ear canal lesion.  Left ear canal lesion - Persistent sore in the ear canal for several months, onset after initiating hearing aid use - Sore is bothersome and was previously drained by dermatologist but states the provider did not get much out - Symptoms are aggravated by hearing aid use and improve when hearing aid is not worn for several days - No bleeding or drainage otherwise.   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 Had a cyst that the dermatologist tried to drain. Given cipro drops   12/21/23 WBC 7.7  12/21/23 Hb A1c 6.3  PMH/Meds/All/SocHx/FamHx/ROS:   Past Medical History:  Diagnosis Date   Abdominal aortic aneurysm (AAA) without rupture 06/23/2018   Age-related osteoporosis without current pathological fracture 07/14/2021   BPH (benign prostatic hyperplasia) 01/19/2010   Cervical radiculopathy 06/16/2018   Chronic anticoagulation 08/22/2018   Chronic bilateral low back pain without sciatica 07/18/2019   Chronic sinusitis 05/18/2021   Diverticulosis     Erectile dysfunction due to arterial insufficiency 04/23/2015   Esophageal stricture    Essential hypertension 05/01/2018   Estimated Creatinine Clearance: 44.1 mL/min (by C-G formula based on SCr of 1.35 mg/dL).   GERD (gastroesophageal reflux disease) 11/10/2010   Hearing loss    Hiatal hernia    Hyperlipidemia with target LDL less than 70 11/28/2012   The 10-year ASCVD risk score Verdon DC Jr., et al., 2013) is: 30.7%   Values used to calculate the score:     Age: 85 years     Sex: Male     Is Non-Hispanic African American: No     Diabetic: No     Tobacco smoker: No     Systolic Blood Pressure: 132 mmHg     Is BP treated: No     HDL Cholesterol: 30.8 mg/dL     Total Cholesterol: 183 mg/dL   Hypoglycemia    Iron deficiency anemia due to dietary causes 12/07/2018   Lung nodule seen on imaging study 06/23/2018   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 08/22/2018    a Medtronic Azure XT MRI conditional dual-chamber pacemaker for symptomatic sinus pauses, sick sinus syndrome, and syncope   Migraine headache    OAB (overactive bladder) 01/06/2016   PAF (paroxysmal atrial fibrillation)    Echo with normal LV function and mild LVH   Prediabetes 12/06/2018   Pure hyperglyceridemia 11/22/2007   Recurrent ventral incisional hernia 08/22/2018   Renal cell carcinoma of left kidney 01/15/2019   Sick sinus syndrome 07/18/2018   Stage 3b chronic kidney disease 01/18/2020   Estimated Creatinine Clearance: 44.1 mL/min (by C-G formula based  on SCr of 1.35 mg/dL).   Estimated Creatinine Clearance: 51.7 mL/min (by C-G formula based on SCr of 1.15 mg/dL).   Syncope    presented with atrial fib converted with Diltiazem    Systemic inflammatory response syndrome 06/26/2018   Thrombocytopenia 01/19/2021     Past Surgical History:  Procedure Laterality Date   CYSTOSCOPY WITH URETEROSCOPY AND STENT PLACEMENT Left 08/16/2018   Procedure: CYSTOSCOPY WITH LEFT RETROGRADE PYELOGRAM,  BALLOON DILATION LEFT URETER, STENT PLACEMENT;  Surgeon: Carolee Sherwood JONETTA DOUGLAS, MD;  Location: [facility];  Service: Urology;  Laterality: Left;   HERNIA REPAIR     INSERT / REPLACE / REMOVE PACEMAKER     LAPAROSCOPIC ASSISTED VENTRAL HERNIA REPAIR N/A 10/18/2018   Procedure: LAPAROSCOPIC ASSISTED VENTRAL WALL HERNIA REPAIR WITH MESH;  Surgeon: Sheldon Standing, MD;  Location: [facility];  Service: General;  Laterality: N/A;   LAPAROSCOPIC NEPHRECTOMY, HAND ASSISTED Left 10/18/2018   Procedure: LEFT HAND ASSISTED LAPAROSCOPIC RADICAL NEPHRECTOMY WITH ADRENALECTOMY;  Surgeon: Carolee Sherwood JONETTA DOUGLAS, MD;  Location: [facility];  Service: Urology;  Laterality: Left;   LOOP RECORDER INSERTION N/A 04/26/2018   Procedure: LOOP RECORDER INSERTION;  Surgeon: Kelsie Agent, MD;  Location: [facility];  Service: Cardiovascular;  Laterality: N/A;   LOOP RECORDER REMOVAL N/A 07/18/2018   Procedure: LOOP RECORDER REMOVAL;  Surgeon: Kelsie Agent, MD;  Location: [facility];  Service: Cardiovascular;  Laterality: N/A;   NASAL SEPTUM SURGERY     PACEMAKER IMPLANT N/A 07/18/2018   Procedure: PACEMAKER IMPLANT;  Surgeon: Kelsie Agent, MD;  Location: [facility];  Service: Cardiovascular;  Laterality: N/A;   SKIN BIOPSY     TONSILLECTOMY     TONSILLECTOMY     UMBILICAL HERNIA REPAIR  1993   WISDOM TOOTH EXTRACTION     about 83 years old    Family History  Problem Relation Age of Onset   Emphysema Father    Memory loss Sister    Gallbladder disease Sister    Bipolar disorder Sister    Bipolar disorder Paternal Grandmother    Dementia Paternal Grandmother    Memory loss Paternal Grandmother    Colon cancer Neg Hx      Social Connections: Moderately Integrated (12/19/2023)   Social Connection and Isolation Panel    Frequency of Communication with Friends and Family: Twice a week    Frequency of Social Gatherings with Friends and Family: Three times a week    Attends Religious Services: Patient  declined    Active Member of Clubs or Organizations: Yes    Attends Engineer, Structural: More than 4 times per year    Marital Status: Married     Current Outpatient Medications  Medication Instructions   acetaminophen  (TYLENOL ) 1,000 mg, Every 6 hours PRN   Calcium  Carbonate (CALCIUM  500 PO) 500 mg, 2 times daily   ciprofloxacin-hydrocortisone  (CIPRO HC) OTIC suspension 3 drops, Left EAR, 2 times daily   denosumab  (PROLIA ) 60 mg, Every 6 months   esomeprazole  (NEXIUM ) 40 mg, Oral, Daily before breakfast   fluticasone (FLONASE) 50 MCG/ACT nasal spray Daily   metoprolol  succinate (TOPROL -XL) 50 mg, Oral, Daily at bedtime   Multiple Vitamins-Minerals (CENTRUM MULTI + OMEGA 3 PO) 1 tablet, Daily   omega-3 acid ethyl esters (LOVAZA ) 2 g, Oral, 2 times daily   rosuvastatin  (CRESTOR ) 10 mg, Oral, Daily   solifenacin  (VESICARE ) 10 mg, Oral, Daily at bedtime   VITAMIN A PO Daily   VITAMIN D  PO  1,000 Units, Daily     Physical Exam:   BP 124/74   Pulse 83   SpO2 94%   Salient findings:  CN II-XII intact Given history and complaints, ear microscopy was indicated and performed for evaluation with findings as below in physical exam section and in procedures Right EAC clear and TM intact with well pneumatized middle ear spaces Left EAC with small sore at the meatus that seems to make contact with hearing aid. Otherwise canal is clear, TM intact, ME space aerated.  Anterior rhinoscopy: Septum midline anteriorly; bilateral inferior turbinates with mild hypertrophy  No lesions of oral cavity/oropharynx No obviously palpable neck masses/lymphadenopathy/thyromegaly No respiratory distress or stridor  Seprately Identifiable Procedures:  Prior to initiating any procedures, risks/benefits/alternatives were explained to the patient and verbal consent obtained.  Procedure (01/05/2024): Bilateral ear microscopy using microscope (CPT [id number]) Pre-procedure diagnosis: left ear canal  lesion Post-procedure diagnosis: same Indication: see above; given patient's otologic complaints and history, for improved and comprehensive examination of external ear and tympanic membrane, bilateral otologic examination using microscope was performed. Prior to proceeding, verbal consent was obtained after discussion of R/B/A  Procedure: Patient was placed semi-recumbent. Both ear canals were examined using the microscope with findings above. Patient tolerated the procedure well.   Impression & Plans:  Johsua Shevlin is a 83 y.o. male with   1. Lesion of external ear canal, left   2. 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 Lesion appears as irritated skin, not a cyst.  - Refrain from using hearing aid for one week. - Follow-up in one week to reassess. - Consider biopsy if necessary.  Nasal congestion Chronic nasal congestion previously managed with Afrin, not recommended. Flonase effective with regular use. - Continue to use Flonase daily.   See below regarding exact medications prescribed this encounter including dosages and route: No orders of the defined types were placed in this encounter.   Thank you for allowing me the opportunity to care for your patient. Please do not hesitate to contact me should you have any other questions.  Sincerely, Hadassah Parody, MD Otolaryngologist (ENT), [facility] Phone: [phone] Fax: [phone]

## 2024-01-11 ENCOUNTER — Ambulatory Visit: Payer: Medicare Other

## 2024-01-11 DIAGNOSIS — I495 Sick sinus syndrome: Secondary | ICD-10-CM | POA: Diagnosis not present

## 2024-01-12 ENCOUNTER — Ambulatory Visit (INDEPENDENT_AMBULATORY_CARE_PROVIDER_SITE_OTHER)

## 2024-01-12 VITALS — BP 139/86 | HR 65

## 2024-01-12 DIAGNOSIS — K117 Disturbances of salivary secretion: Secondary | ICD-10-CM | POA: Diagnosis not present

## 2024-01-12 DIAGNOSIS — H6192 Disorder of left external ear, unspecified: Secondary | ICD-10-CM

## 2024-01-12 DIAGNOSIS — H61899 Other specified disorders of external ear, unspecified ear: Secondary | ICD-10-CM

## 2024-01-12 LAB — CUP PACEART REMOTE DEVICE CHECK
Battery Remaining Longevity: 100 mo
Battery Voltage: 2.98 V
Brady Statistic AP VP Percent: 15.37 %
Brady Statistic AP VS Percent: 83.87 %
Brady Statistic AS VP Percent: 0.04 %
Brady Statistic AS VS Percent: 0.72 %
Brady Statistic RA Percent Paced: 99.39 %
Brady Statistic RV Percent Paced: 15.41 %
Date Time Interrogation Session: 20251111222930
Implantable Lead Connection Status: 753985
Implantable Lead Connection Status: 753985
Implantable Lead Implant Date: 20200519
Implantable Lead Implant Date: 20200519
Implantable Lead Location: 753859
Implantable Lead Location: 753860
Implantable Lead Model: 5076
Implantable Lead Model: 5076
Implantable Pulse Generator Implant Date: 20200519
Lead Channel Impedance Value: 304 Ohm
Lead Channel Impedance Value: 361 Ohm
Lead Channel Impedance Value: 551 Ohm
Lead Channel Impedance Value: 589 Ohm
Lead Channel Pacing Threshold Amplitude: 0.375 V
Lead Channel Pacing Threshold Amplitude: 0.5 V
Lead Channel Pacing Threshold Pulse Width: 0.4 ms
Lead Channel Pacing Threshold Pulse Width: 0.4 ms
Lead Channel Sensing Intrinsic Amplitude: 3.375 mV
Lead Channel Sensing Intrinsic Amplitude: 3.375 mV
Lead Channel Sensing Intrinsic Amplitude: 4.25 mV
Lead Channel Sensing Intrinsic Amplitude: 4.25 mV
Lead Channel Setting Pacing Amplitude: 1.5 V
Lead Channel Setting Pacing Amplitude: 2.5 V
Lead Channel Setting Pacing Pulse Width: 0.4 ms
Lead Channel Setting Sensing Sensitivity: 2 mV
Zone Setting Status: 755011
Zone Setting Status: 755011

## 2024-01-12 MED ORDER — PILOCARPINE HCL 5 MG PO TABS: 5.0000 mg | ORAL_TABLET | Freq: Two times a day (BID) | ORAL | 0 refills | Status: DC

## 2024-01-15 NOTE — Progress Notes (Signed)
 Dear Dr. [name], Here is my assessment for our mutual patient, [name] Thank you for allowing me the opportunity to care for your patient. Please do not hesitate to contact me should you have any other questions. Sincerely, Dr. Hadassah Parody  Otolaryngology Clinic Note Referring provider: Dr. [name]:   Initial HPI (01/05/24) Discussed the use of AI scribe software for clinical note transcription with the patient, who gave verbal consent to proceed.  Frank JINNY Search Mickey. [name] is an 83 year old male who presents with left ear canal lesion.  Left ear canal lesion - Persistent sore in the ear canal for several months, onset after initiating hearing aid use - Sore is bothersome and was previously drained by dermatologist but states the provider did not get much out - Symptoms are aggravated by hearing aid use and improve when hearing aid is not worn for several days - No bleeding or drainage otherwise.   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 Feels like ear is a little better after keeping hearing aid out. 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 - Multiple remedies, including dentist-recommended products and vitamin A, have not provided lasting relief.   Independent Review of Additional Tests or Records:  Referral note Frank [name], MD (12/21/2023): sore left ear since getting new hearing aids. Had a cyst that the dermatologist tried to drain. Given cipro drops   12/21/23 WBC 7.7  12/21/23 Hb A1c  6.3  PMH/Meds/All/SocHx/FamHx/ROS:   Past Medical History:  Diagnosis Date   Abdominal aortic aneurysm (AAA) without rupture 06/23/2018   Age-related osteoporosis without current pathological fracture 07/14/2021   BPH (benign prostatic hyperplasia) 01/19/2010   Cervical radiculopathy 06/16/2018   Chronic anticoagulation 08/22/2018   Chronic bilateral low back pain without sciatica 07/18/2019   Chronic sinusitis 05/18/2021   Diverticulosis    Erectile dysfunction due to arterial insufficiency 04/23/2015   Esophageal stricture    Essential hypertension 05/01/2018   Estimated Creatinine Clearance: 44.1 mL/min (by C-G formula based on SCr of 1.35 mg/dL).   GERD (gastroesophageal reflux disease) 11/10/2010   Hearing loss    Hiatal hernia    Hyperlipidemia with target LDL less than 70 11/28/2012   The 10-year ASCVD risk score Verdon DC Jr., et al., 2013) is: 30.7%   Values used to calculate the score:     Age: 53 years     Sex: Male     Is Non-Hispanic African American: No     Diabetic: No     Tobacco smoker: No     Systolic Blood Pressure: 132 mmHg     Is BP treated: No     HDL Cholesterol: 30.8 mg/dL     Total Cholesterol: 183 mg/dL   Hypoglycemia    Iron deficiency anemia due to dietary causes 12/07/2018   Lung nodule seen on imaging study 06/23/2018   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35 mg/dL).   Estimated Creatinine Clearance: 51.7 mL/min (by C-G formula based on SCr of 1.15 mg/dL).   Syncope    presented with atrial fib converted with Diltiazem    Systemic inflammatory response syndrome 06/26/2018   Thrombocytopenia 01/19/2021     Past Surgical History:  Procedure Laterality Date   CYSTOSCOPY WITH URETEROSCOPY AND STENT PLACEMENT Left 08/16/2018   Procedure: CYSTOSCOPY WITH LEFT RETROGRADE PYELOGRAM, BALLOON DILATION LEFT URETER, STENT PLACEMENT;  Surgeon: Carolee Sherwood JONETTA DOUGLAS, MD;  Location: [facility];  Service: Urology;  Laterality: Left;   HERNIA REPAIR     INSERT / REPLACE / REMOVE PACEMAKER     LAPAROSCOPIC ASSISTED VENTRAL HERNIA REPAIR N/A 10/18/2018   Procedure: LAPAROSCOPIC ASSISTED VENTRAL WALL HERNIA REPAIR WITH MESH;  Surgeon: Sheldon Standing, MD;  Location: [facility];  Service: General;  Laterality: N/A;   LAPAROSCOPIC NEPHRECTOMY, HAND ASSISTED Left 10/18/2018   Procedure: LEFT HAND ASSISTED LAPAROSCOPIC RADICAL NEPHRECTOMY WITH ADRENALECTOMY;  Surgeon: Carolee Sherwood JONETTA DOUGLAS, MD;  Location: [facility];  Service: Urology;  Laterality: Left;   LOOP RECORDER INSERTION N/A 04/26/2018   Procedure: LOOP RECORDER INSERTION;  Surgeon: Kelsie Agent, MD;  Location: [facility];  Service: Cardiovascular;  Laterality: N/A;   LOOP RECORDER REMOVAL N/A 07/18/2018   Procedure: LOOP RECORDER REMOVAL;  Surgeon: Kelsie Agent, MD;  Location: [facility];  Service: Cardiovascular;  Laterality: N/A;   NASAL SEPTUM SURGERY     PACEMAKER IMPLANT N/A 07/18/2018   Procedure: PACEMAKER IMPLANT;  Surgeon: Kelsie Agent, MD;  Location: [facility];  Service: Cardiovascular;  Laterality: N/A;   SKIN BIOPSY     TONSILLECTOMY     TONSILLECTOMY     UMBILICAL HERNIA REPAIR  1993   WISDOM TOOTH EXTRACTION     about 84 years old    Family History  Problem Relation Age of Onset   Emphysema Father    Memory loss Sister    Gallbladder disease Sister     Bipolar disorder Sister    Bipolar disorder Paternal Grandmother    Dementia Paternal Grandmother    Memory loss Paternal Grandmother    Colon cancer Neg Hx      Social Connections: Moderately Integrated (12/19/2023)   Social Connection and Isolation Panel    Frequency of Communication with Friends and Family: Twice a week    Frequency of Social Gatherings with Friends and Family: Three times a week    Attends Religious Services: Patient declined    Active Member of Clubs or Organizations: Yes    Attends Engineer, Structural: More than 4 times per year    Marital Status: Married     Current Outpatient Medications  Medication Instructions   acetaminophen  (TYLENOL ) 1,000 mg, Every 6 hours PRN   Calcium  Carbonate (CALCIUM  500 PO) 500 mg, 2 times daily   ciprofloxacin-hydrocortisone  (CIPRO HC) OTIC suspension 3 drops, Left EAR, 2 times daily   denosumab  (PROLIA ) 60 mg, Every 6 months   esomeprazole  (NEXIUM ) 40 mg, Oral, Daily before breakfast   fluticasone (FLONASE) 50 MCG/ACT nasal spray Daily   metoprolol   succinate (TOPROL -XL) 50 mg, Oral, Daily at bedtime   Multiple Vitamins-Minerals (CENTRUM MULTI + OMEGA 3 PO) 1 tablet, Daily   omega-3 acid ethyl esters (LOVAZA ) 2 g, Oral, 2 times daily   pilocarpine (SALAGEN) 5 mg, Oral, 2 times daily   rosuvastatin  (CRESTOR ) 10 mg, Oral, Daily   solifenacin  (VESICARE ) 10 mg, Oral, Daily at bedtime   VITAMIN A PO Daily   VITAMIN D  PO 1,000 Units, Daily     Physical Exam:   BP 139/86   Pulse 65   SpO2 93%   Salient findings:  CN II-XII intact Given history and complaints, ear microscopy was indicated and performed for evaluation with findings as below in physical exam section and in procedures Right EAC clear and TM intact with well pneumatized middle ear spaces Left EAC with small sore at the meatus that seems to make contact with hearing aid - looks slightly better today. Otherwise canal is clear, TM intact, ME space  aerated.  Anterior rhinoscopy: Septum midline anteriorly; bilateral inferior turbinates with mild hypertrophy  No lesions of oral cavity/oropharynx No obviously palpable neck masses/lymphadenopathy/thyromegaly No respiratory distress or stridor  Seprately Identifiable Procedures:  Prior to initiating any procedures, risks/benefits/alternatives were explained to the patient and verbal consent obtained.  Procedure (01/12/2024): Bilateral ear microscopy using microscope (CPT [id number]) Pre-procedure diagnosis: left ear canal lesion Post-procedure diagnosis: same Indication: see above; given patient's otologic complaints and history, for improved and comprehensive examination of external ear and tympanic membrane, bilateral otologic examination using microscope was performed. Prior to proceeding, verbal consent was obtained after discussion of R/B/A  Procedure: Patient was placed semi-recumbent. Both ear canals were examined using the microscope with findings above. Patient tolerated the procedure well.   Impression & Plans:  Frank [name] is a 83 y.o. male with   1. Lesion of external ear canal, left   2. Xerostomia     Assessment and Plan Assessment & Plan Left external ear canal lesion likely secondary to hearing aid irritation Lesion likely due to hearing aid irritation, with noted improvement. Possible reactive area or minor follicular infection. - Avoid hearing aid for a few more weeks. - Follow-up in one month to reassess lesion and consider procedure if no improvement.  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 Salagen proposed to stimulate salivary glands. Discussed trialing salagen but warned about possibility of side effects (ie dizziness) but pt would like to try since he has not tried before  - Prescribed Salagen (pilocarpine) at low dose  - Advised monitoring for side effects such as dizziness and can  stop if he does not tolerate well  - Suggested sour candies to stimulate saliva production.   See below regarding exact medications prescribed this encounter including dosages and route: Meds ordered this encounter  Medications   pilocarpine (SALAGEN) 5 MG tablet    Sig: Take 1 tablet (5 mg total) by mouth 2 (two) times daily.    Dispense:  60 tablet    Refill:  0    Thank you for allowing me the opportunity to care for your patient. Please do not hesitate to contact me should you have any other questions.  Sincerely, Hadassah Parody, MD Otolaryngologist (ENT), [facility] Phone: [phone] Fax: [phone]

## 2024-01-17 ENCOUNTER — Ambulatory Visit: Payer: Self-pay | Admitting: Cardiology

## 2024-01-17 NOTE — Progress Notes (Signed)
 Remote PPM Transmission

## 2024-01-24 ENCOUNTER — Ambulatory Visit: Admitting: Internal Medicine

## 2024-01-26 ENCOUNTER — Other Ambulatory Visit: Payer: Self-pay | Admitting: Internal Medicine

## 2024-01-26 DIAGNOSIS — E785 Hyperlipidemia, unspecified: Secondary | ICD-10-CM

## 2024-02-10 ENCOUNTER — Encounter (INDEPENDENT_AMBULATORY_CARE_PROVIDER_SITE_OTHER): Payer: Self-pay

## 2024-02-10 ENCOUNTER — Encounter: Payer: Self-pay | Admitting: Emergency Medicine

## 2024-02-10 ENCOUNTER — Ambulatory Visit (INDEPENDENT_AMBULATORY_CARE_PROVIDER_SITE_OTHER)

## 2024-02-10 VITALS — BP 142/89 | HR 60

## 2024-02-10 MED ORDER — PILOCARPINE HCL 5 MG PO TABS: 5.0000 mg | ORAL_TABLET | Freq: Two times a day (BID) | ORAL | 2 refills | Status: AC

## 2024-02-14 NOTE — Progress Notes (Unsigned)
 Chief Complaint: Primary GI MD: Dr. Abran  HPI: 83 year old-year-old male history of GERD complicated by peptic stricture, adenomatous colon polyps, metastatic renal cell cancer drug-induced xerostomia, presents for medication refill  Last seen 04/2023 by Dr. Abran  Discussed the use of AI scribe software for clinical note transcription with the patient, who gave verbal consent to proceed.  History of Present Illness      PREVIOUS GI WORKUP   Last colonoscopy 2018 with diminutive adenoma. Last upper endoscopy 2013   Past Medical History:  Diagnosis Date   Abdominal aortic aneurysm (AAA) without rupture 06/23/2018   Age-related osteoporosis without current pathological fracture 07/14/2021   BPH (benign prostatic hyperplasia) 01/19/2010   Cervical radiculopathy 06/16/2018   Chronic anticoagulation 08/22/2018   Chronic bilateral low back pain without sciatica 07/18/2019   Chronic sinusitis 05/18/2021   Diverticulosis    Erectile dysfunction due to arterial insufficiency 04/23/2015   Esophageal stricture    Essential hypertension 05/01/2018   Estimated Creatinine Clearance: 44.1 mL/min (by C-G formula based on SCr of 1.35 mg/dL).   GERD (gastroesophageal reflux disease) 11/10/2010   Hearing loss    Hiatal hernia    Hyperlipidemia with target LDL less than 70 11/28/2012   The 10-year ASCVD risk score Verdon DC Jr., et al., 2013) is: 30.7%   Values used to calculate the score:     Age: 26 years     Sex: Male     Is Non-Hispanic African American: No     Diabetic: No     Tobacco smoker: No     Systolic Blood Pressure: 132 mmHg     Is BP treated: No     HDL Cholesterol: 30.8 mg/dL     Total Cholesterol: 183 mg/dL   Hypoglycemia    Iron deficiency anemia due to dietary causes 12/07/2018   Lung nodule seen on imaging study 06/23/2018   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 08/22/2018    a Medtronic Azure XT MRI conditional dual-chamber pacemaker for  symptomatic sinus pauses, sick sinus syndrome, and syncope   Migraine headache    OAB (overactive bladder) 01/06/2016   PAF (paroxysmal atrial fibrillation)    Echo with normal LV function and mild LVH   Prediabetes 12/06/2018   Pure hyperglyceridemia 11/22/2007   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   Estimated Creatinine Clearance: 51.7 mL/min (by C-G formula based on SCr of 1.15 mg/dL).   Syncope    presented with atrial fib converted with Diltiazem    Systemic inflammatory response syndrome 06/26/2018   Thrombocytopenia 01/19/2021    Past Surgical History:  Procedure Laterality Date   CYSTOSCOPY WITH URETEROSCOPY AND STENT PLACEMENT Left 08/16/2018   Procedure: CYSTOSCOPY WITH LEFT RETROGRADE PYELOGRAM, BALLOON DILATION LEFT URETER, STENT PLACEMENT;  Surgeon: Carolee Sherwood JONETTA DOUGLAS, MD;  Location: [facility];  Service: Urology;  Laterality: Left;   HERNIA REPAIR     INSERT / REPLACE / REMOVE PACEMAKER     LAPAROSCOPIC ASSISTED VENTRAL HERNIA REPAIR N/A 10/18/2018   Procedure: LAPAROSCOPIC ASSISTED VENTRAL WALL HERNIA REPAIR WITH MESH;  Surgeon: Sheldon Standing, MD;  Location: [facility];  Service: General;  Laterality: N/A;   LAPAROSCOPIC NEPHRECTOMY, HAND ASSISTED Left 10/18/2018   Procedure: LEFT HAND ASSISTED LAPAROSCOPIC RADICAL NEPHRECTOMY WITH ADRENALECTOMY;  Surgeon: Carolee Sherwood JONETTA DOUGLAS, MD;  Location: [facility];  Service: Urology;  Laterality: Left;   LOOP RECORDER INSERTION N/A 04/26/2018   Procedure: LOOP RECORDER INSERTION;  Surgeon: Kelsie Agent, MD;  Location: [facility];  Service: Cardiovascular;  Laterality: N/A;   LOOP RECORDER REMOVAL N/A 07/18/2018   Procedure: LOOP RECORDER REMOVAL;  Surgeon: Kelsie Agent, MD;  Location: [facility];   Service: Cardiovascular;  Laterality: N/A;   NASAL SEPTUM SURGERY     PACEMAKER IMPLANT N/A 07/18/2018   Procedure: PACEMAKER IMPLANT;  Surgeon: Kelsie Agent, MD;  Location: [facility];  Service: Cardiovascular;  Laterality: N/A;   SKIN BIOPSY     TONSILLECTOMY     TONSILLECTOMY     UMBILICAL HERNIA REPAIR  1993   WISDOM TOOTH EXTRACTION     about 83 years old    Current Outpatient Medications  Medication Sig Dispense Refill   acetaminophen  (TYLENOL ) 500 MG tablet Take 1,000 mg by mouth every 6 (six) hours as needed for mild pain.     Calcium  Carbonate (CALCIUM  500 PO) Take 500 mg by mouth 2 (two) times daily.     ciprofloxacin -hydrocortisone  (CIPRO  HC) OTIC suspension Place 3 drops into the left ear 2 (two) times daily. 10 mL 1   denosumab  (PROLIA ) 60 MG/ML SOSY injection Inject 60 mg into the skin every 6 (six) months.     esomeprazole  (NEXIUM ) 40 MG capsule Take 1 capsule (40 mg total) by mouth daily before breakfast. 90 capsule 3   fluticasone (FLONASE) 50 MCG/ACT nasal spray Place into both nostrils daily.     metoprolol  succinate (TOPROL -XL) 25 MG 24 hr tablet TAKE 2 TABLETS BY MOUTH AT BEDTIME 90 tablet 2   Multiple Vitamins-Minerals (CENTRUM MULTI + OMEGA 3 PO) Take 1 tablet by mouth daily.      omega-3 acid ethyl esters (LOVAZA ) 1 g capsule Take 2 capsules by mouth twice daily 360 capsule 0   pilocarpine  (SALAGEN ) 5 MG tablet Take 1 tablet (5 mg total) by mouth 2 (two) times daily. 60 tablet 2   rosuvastatin  (CRESTOR ) 10 MG tablet Take 1 tablet by mouth once daily 90 tablet 0   solifenacin  (VESICARE ) 10 MG tablet TAKE 1 TABLET BY MOUTH AT BEDTIME 90 tablet 0   VITAMIN A PO Take by mouth daily.     VITAMIN D  PO Take 1,000 Units by mouth daily.     Current Facility-Administered Medications  Medication Dose Route Frequency Provider Last Rate Last Admin   denosumab  (PROLIA ) injection 60 mg  60 mg Subcutaneous Q6 months [name] Debby CROME, MD        Allergies as of  02/15/2024 - Review Complete 02/10/2024  Allergen Reaction Noted   Hydrocodone  Other (See Comments) 10/06/2018   Monosodium glutamate Other (See Comments) 04/24/2018   Naproxen  sodium  08/14/2018   Other  10/06/2018    Family History  Problem Relation Age of Onset   Emphysema Father    Memory loss Sister    Gallbladder disease Sister    Bipolar disorder Sister    Bipolar disorder Paternal Grandmother    Dementia Paternal Grandmother    Memory loss Paternal Grandmother    Colon cancer Neg Hx     Social History   Socioeconomic History   Marital status: Married    Spouse name: Not on file   Number of children: 1   Years of education: 12   Highest education level: 12th grade  Occupational History  Occupation: Retired    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, 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[employer]); 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  Tobacco Use   Smoking status: Former    Current packs/day: 0.00    Types: Cigarettes    Quit date: 03/02/1987    Years since quitting: 36.9   Smokeless tobacco: Never  Vaping Use   Vaping status: Never Used  Substance and Sexual Activity   Alcohol  use: No    Alcohol /week: 0.0 standard drinks of alcohol    Drug use: No   Sexual activity: Not Currently  Other Topics Concern   Not on file  Social History Narrative   Lives with wife and 2 dogs/2025   Social Drivers of Health   Tobacco Use: Medium Risk (02/10/2024)   Patient History    Smoking Tobacco Use: Former    Smokeless Tobacco Use: Never    Passive Exposure: Not on Actuary Strain: Low Risk (12/19/2023)   Overall Financial Resource Strain (CARDIA)    Difficulty of Paying Living Expenses: Not hard at all  Food Insecurity: No Food Insecurity (12/19/2023)   Epic    Worried About Programme Researcher, Broadcasting/film/video in the Last Year: Never true    Ran Out of Food in the Last Year: Never true  Transportation Needs: No Transportation Needs (12/19/2023)   Epic    Lack of Transportation (Medical): No    Lack of  Transportation (Non-Medical): No  Physical Activity: Insufficiently Active (12/19/2023)   Exercise Vital Sign    Days of Exercise per Week: 3 days    Minutes of Exercise per Session: 30 min  Stress: Stress Concern Present (12/19/2023)   [employer] of Occupational Health - Occupational Stress Questionnaire    Feeling of Stress: To some extent  Social Connections: Moderately Integrated (12/19/2023)   Social Connection and Isolation Panel    Frequency of Communication with Friends and Family: Twice a week    Frequency of Social Gatherings with Friends and Family: Three times a week    Attends Religious Services: Patient declined    Active Member of Clubs or Organizations: Yes    Attends Banker Meetings: More than 4 times per year    Marital Status: Married  Catering Manager Violence: Not At Risk (07/29/2023)   Humiliation, Afraid, Rape, and Kick questionnaire    Fear of Current or Ex-Partner: No    Emotionally Abused: No    Physically Abused: No    Sexually Abused: No  Depression (PHQ2-9): Low Risk (07/29/2023)   Depression (PHQ2-9)    PHQ-2 Score: 1  Alcohol  Screen: Not on file  Housing: Low Risk (12/19/2023)   Epic    Unable to Pay for Housing in the Last Year: No    Number of Times Moved in the Last Year: 0    Homeless in the Last Year: No  Utilities: Not At Risk (07/29/2023)   [facility] Utilities    Threatened with loss of utilities: No  Health Literacy: Adequate Health Literacy (07/29/2023)   B1300 Health Literacy    Frequency of need for help with medical instructions: Never    Review of Systems:    Constitutional: No weight loss, fever, chills, weakness or fatigue HEENT: Eyes: No change in vision               Ears, Nose, Throat:  No change in hearing or congestion Skin: No rash or itching Cardiovascular: No chest pain, chest pressure or palpitations   Respiratory: No SOB or cough Gastrointestinal: See HPI and  otherwise negative Genitourinary: No dysuria  or change in urinary frequency Neurological: No headache, dizziness or syncope Musculoskeletal: No new muscle or joint pain Hematologic: No bleeding or bruising Psychiatric: No history of depression or anxiety    Physical Exam:  Vital signs: There were no vitals taken for this visit.  Constitutional: NAD, alert and cooperative Head:  Normocephalic and atraumatic. Eyes:   PEERL, EOMI. No icterus. Conjunctiva pink. Respiratory: Respirations even and unlabored. Lungs clear to auscultation bilaterally.   No wheezes, crackles, or rhonchi.  Cardiovascular:  Regular rate and rhythm. No peripheral edema, cyanosis or pallor.  Gastrointestinal:  Soft, nondistended, nontender. No rebound or guarding. Normal bowel sounds. No appreciable masses or hepatomegaly. Rectal:  Declines Msk:  Symmetrical without gross deformities. Without edema, no deformity or joint abnormality.  Neurologic:  Alert and  oriented x4;  grossly normal neurologically.  Skin:   Dry and intact without significant lesions or rashes. Psychiatric: Oriented to person, place and time. Demonstrates good judgement and reason without abnormal affect or behaviors.  Physical Exam    RELEVANT LABS AND IMAGING: CBC    Component Value Date/Time   WBC 6.1 12/26/2023 1254   WBC 7.7 12/21/2023 1024   RBC 4.05 (L) 12/26/2023 1254   HGB 13.4 12/26/2023 1254   HGB 13.5 07/21/2020 1211   HGB 15.1 11/09/2013 0000   HCT 38.4 (L) 12/26/2023 1254   HCT 37.9 07/21/2020 1211   HCT 44 11/09/2013 0000   PLT 115 (L) 12/26/2023 1254   PLT 110 (L) 07/21/2020 1211   MCV 94.8 12/26/2023 1254   MCV 94 07/21/2020 1211   MCH 33.1 12/26/2023 1254   MCHC 34.9 12/26/2023 1254   RDW 13.9 12/26/2023 1254   RDW 13.2 07/21/2020 1211   LYMPHSABS 1.1 12/26/2023 1254   MONOABS 0.5 12/26/2023 1254   EOSABS 0.1 12/26/2023 1254   BASOSABS 0.0 12/26/2023 1254    CMP     Component Value Date/Time   NA 134 (L) 12/26/2023 1254   NA 135 07/21/2020 1211    K 4.2 12/26/2023 1254   K 3.8 11/09/2013 0000   CL 102 12/26/2023 1254   CL 105 11/09/2013 0000   CO2 27 12/26/2023 1254   CO2 26 11/09/2013 0000   GLUCOSE 115 (H) 12/26/2023 1254   BUN 17 12/26/2023 1254   BUN 18 07/21/2020 1211   CREATININE 1.20 12/26/2023 1254   CREATININE 0.66 11/09/2013 0000   CALCIUM  9.3 12/26/2023 1254   CALCIUM  8.7 11/09/2013 0000   PROT 7.2 12/26/2023 1254   PROT 6.4 10/12/2017 0729   ALBUMIN  4.2 12/26/2023 1254   ALBUMIN  4.2 10/12/2017 0729   AST 18 12/26/2023 1254   ALT 13 12/26/2023 1254   ALKPHOS 42 12/26/2023 1254   ALKPHOS 55 11/09/2013 0000   BILITOT 0.6 12/26/2023 1254   GFRNONAA >60 12/26/2023 1254   GFRAA >60 10/20/2018 0447     Assessment/Plan:   83 year old-year-old male history of GERD complicated by peptic stricture, adenomatous colon polyps, metastatic renal cell cancer drug-induced xerostomia, presents for medication refill  GERD EGD 2013.  Well-controlled on Nexium .  history of colon polyps Colonoscopy 2018 with diminutive adenoma and no recall due to age  SND s/p Medtronic PPM  PAF Not on anticoagulation   Frank [name] [facility] 02/14/2024, 10:52 AM  Cc: [name] Debby CROME, MD

## 2024-02-15 ENCOUNTER — Encounter: Payer: Self-pay | Admitting: Gastroenterology

## 2024-02-15 ENCOUNTER — Ambulatory Visit: Admitting: Gastroenterology

## 2024-02-15 VITALS — BP 132/76 | HR 77 | Ht 64.0 in | Wt 193.5 lb

## 2024-02-15 DIAGNOSIS — Z860100 Personal history of colon polyps, unspecified: Secondary | ICD-10-CM

## 2024-02-15 DIAGNOSIS — I480 Paroxysmal atrial fibrillation: Secondary | ICD-10-CM | POA: Diagnosis not present

## 2024-02-15 DIAGNOSIS — K219 Gastro-esophageal reflux disease without esophagitis: Secondary | ICD-10-CM

## 2024-02-15 DIAGNOSIS — Z860101 Personal history of adenomatous and serrated colon polyps: Secondary | ICD-10-CM

## 2024-02-15 DIAGNOSIS — C649 Malignant neoplasm of unspecified kidney, except renal pelvis: Secondary | ICD-10-CM

## 2024-02-15 DIAGNOSIS — I714 Abdominal aortic aneurysm, without rupture, unspecified: Secondary | ICD-10-CM

## 2024-02-15 MED ORDER — ESOMEPRAZOLE MAGNESIUM 40 MG PO CPDR: 40.0000 mg | DELAYED_RELEASE_CAPSULE | Freq: Every day | ORAL | 3 refills | Status: AC

## 2024-02-15 NOTE — Patient Instructions (Signed)
 We have sent the following medications to your pharmacy for you to pick up at your convenience: Nexium   Have a good day Sir.   I appreciate the opportunity to care for you. Nestor Blower, PA

## 2024-02-15 NOTE — Progress Notes (Signed)
 Dear Dr. [name], Here is my assessment for our mutual patient, [name] Thank you for allowing me the opportunity to care for your patient. Please do not hesitate to contact me should you have any other questions. Sincerely, Dr. Hadassah Parody  Otolaryngology Clinic Note Referring provider: Dr. [name]:   Initial HPI (01/05/24) Discussed the use of AI scribe software for clinical note transcription with the patient, who gave verbal consent to proceed.  [name] Search [name] [name] is an 83 year old male who presents with left ear canal lesion.  Left ear canal lesion - Persistent sore in the ear canal for several months, onset after initiating hearing aid use - Sore is bothersome and was previously drained by dermatologist but states the provider did not get much out - Symptoms are aggravated by hearing aid use and improve when hearing aid is not worn for several days - No bleeding or drainage otherwise.   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 Feels like ear is a little better after keeping hearing aid out. 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 - Multiple remedies, including dentist-recommended products and vitamin A, have not provided lasting relief.  --------------------------------------------------------- 02/10/2024   Here for f/u after discontinuing hearing aid for a month. He now has new hearing aids that fit better. The lesion seems to have resolved- he can still feel it a bump if he touches there but it no longer  hurts. Currently able to wear new hearing aid with minimal discomfort.   Salagen  was helpful for xerostomia.     Independent Review of Additional Tests or Records:  Referral note Debby [name], MD (12/21/2023): sore left ear since getting new hearing aids. Had a cyst that the dermatologist tried to drain. Given cipro  drops   12/21/23 WBC 7.7  12/21/23 Hb A1c 6.3  PMH/Meds/All/SocHx/FamHx/ROS:   Past Medical History:  Diagnosis Date   Abdominal aortic aneurysm (AAA) without rupture 06/23/2018   Age-related osteoporosis without current pathological fracture 07/14/2021   BPH (benign prostatic hyperplasia) 01/19/2010   Cervical radiculopathy 06/16/2018   Chronic anticoagulation 08/22/2018   Chronic bilateral low back pain without sciatica 07/18/2019   Chronic sinusitis 05/18/2021   Diverticulosis    Erectile dysfunction due to arterial insufficiency 04/23/2015   Esophageal stricture    Essential hypertension 05/01/2018   Estimated Creatinine Clearance: 44.1 mL/min (by C-G formula based on SCr of 1.35 mg/dL).   GERD (gastroesophageal reflux disease) 11/10/2010   Hearing loss    Hiatal hernia    Hyperlipidemia with target LDL less than 70 11/28/2012   The 10-year ASCVD risk score Verdon DC Jr., et al., 2013) is: 30.7%   Values used to calculate the score:     Age: 60 years     Sex: Male     Is Non-Hispanic African American: No     Diabetic: No     Tobacco smoker: No     Systolic Blood Pressure: 132 mmHg     Is BP treated: No     HDL Cholesterol: 30.8 mg/dL     Total Cholesterol: 183 mg/dL   Hypoglycemia  Iron deficiency anemia due to dietary causes 12/07/2018   Lung nodule seen on imaging study 06/23/2018   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 08/22/2018    a Medtronic Azure XT MRI conditional dual-chamber pacemaker for symptomatic sinus pauses, sick sinus syndrome, and syncope   Migraine headache    OAB (overactive bladder) 01/06/2016   PAF (paroxysmal  atrial fibrillation)    Echo with normal LV function and mild LVH   Prediabetes 12/06/2018   Pure hyperglyceridemia 11/22/2007   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   Estimated Creatinine Clearance: 51.7 mL/min (by C-G formula based on SCr of 1.15 mg/dL).   Syncope    presented with atrial fib converted with Diltiazem    Systemic inflammatory response syndrome 06/26/2018   Thrombocytopenia 01/19/2021     Past Surgical History:  Procedure Laterality Date   CYSTOSCOPY WITH URETEROSCOPY AND STENT PLACEMENT Left 08/16/2018   Procedure: CYSTOSCOPY WITH LEFT RETROGRADE PYELOGRAM, BALLOON DILATION LEFT URETER, STENT PLACEMENT;  Surgeon: Carolee Sherwood JONETTA DOUGLAS, MD;  Location: [facility];  Service: Urology;  Laterality: Left;   HERNIA REPAIR     INSERT / REPLACE / REMOVE PACEMAKER     LAPAROSCOPIC ASSISTED VENTRAL HERNIA REPAIR N/A 10/18/2018   Procedure: LAPAROSCOPIC ASSISTED VENTRAL WALL HERNIA REPAIR WITH MESH;  Surgeon: Sheldon Standing, MD;  Location: [facility];  Service: General;  Laterality: N/A;   LAPAROSCOPIC NEPHRECTOMY, HAND ASSISTED Left 10/18/2018   Procedure: LEFT HAND ASSISTED LAPAROSCOPIC RADICAL NEPHRECTOMY WITH ADRENALECTOMY;  Surgeon: Carolee Sherwood JONETTA DOUGLAS, MD;  Location: [facility];  Service: Urology;  Laterality: Left;   LOOP RECORDER INSERTION N/A 04/26/2018   Procedure: LOOP RECORDER INSERTION;  Surgeon: Kelsie Agent, MD;  Location: [facility];  Service: Cardiovascular;  Laterality: N/A;   LOOP RECORDER REMOVAL N/A 07/18/2018   Procedure: LOOP RECORDER REMOVAL;  Surgeon: Kelsie Agent, MD;  Location: [facility];  Service: Cardiovascular;  Laterality: N/A;   NASAL SEPTUM SURGERY     PACEMAKER IMPLANT N/A 07/18/2018   Procedure: PACEMAKER IMPLANT;   Surgeon: Kelsie Agent, MD;  Location: [facility];  Service: Cardiovascular;  Laterality: N/A;   SKIN BIOPSY     TONSILLECTOMY     TONSILLECTOMY     UMBILICAL HERNIA REPAIR  1993   WISDOM TOOTH EXTRACTION     about 83 years old    Family History  Problem Relation Age of Onset   Emphysema Father    Memory loss Sister    Gallbladder disease Sister    Bipolar disorder Sister    Bipolar disorder Paternal Grandmother    Dementia Paternal Grandmother    Memory loss Paternal Grandmother    Colon cancer Neg Hx      Social Connections: Moderately Integrated (12/19/2023)   Social Connection and Isolation Panel    Frequency of Communication with Friends and Family: Twice a week    Frequency of Social Gatherings with Friends and Family: Three times a week    Attends Religious Services: Patient declined    Active Member of Clubs or Organizations: Yes    Attends Banker Meetings: More than 4 times per year    Marital Status: Married     Current Outpatient Medications  Medication Instructions   acetaminophen  (  TYLENOL ) 1,000 mg, Every 6 hours PRN   Calcium  Carbonate (CALCIUM  500 PO) 500 mg, 2 times daily   ciprofloxacin -hydrocortisone  (CIPRO  HC) OTIC suspension 3 drops, Left EAR, 2 times daily   denosumab  (PROLIA ) 60 mg, Every 6 months   esomeprazole  (NEXIUM ) 40 mg, Oral, Daily before breakfast   fluticasone (FLONASE) 50 MCG/ACT nasal spray Daily   metoprolol  succinate (TOPROL -XL) 50 mg, Oral, Daily at bedtime   Multiple Vitamins-Minerals (CENTRUM MULTI + OMEGA 3 PO) 1 tablet, Daily   omega-3 acid ethyl esters (LOVAZA ) 2 g, Oral, 2 times daily   pilocarpine  (SALAGEN ) 5 mg, Oral, 2 times daily   rosuvastatin  (CRESTOR ) 10 mg, Oral, Daily   solifenacin  (VESICARE ) 10 mg, Oral, Daily at bedtime   VITAMIN A PO Daily   VITAMIN D  PO 1,000 Units, Daily     Physical Exam:   BP (!) 142/89 (BP Location: Right Arm, Patient Position: Sitting) Comment: stressed about his wife  she fell and broke her hip  Pulse 60   SpO2 93%   Salient findings:  CN II-XII intact Given history and complaints, ear microscopy was indicated and performed for evaluation with findings as below in physical exam section and in procedures Right EAC clear and TM intact with well pneumatized middle ear spaces Left EAC with small benign appearing nodule at the meatus that seems to make contact with hearing aid. No erythema. Otherwise canal is clear, TM intact, ME space aerated.  Anterior rhinoscopy: Septum midline anteriorly; bilateral inferior turbinates with mild hypertrophy  No lesions of oral cavity/oropharynx No obviously palpable neck masses/lymphadenopathy/thyromegaly No respiratory distress or stridor  Seprately Identifiable Procedures:  Prior to initiating any procedures, risks/benefits/alternatives were explained to the patient and verbal consent obtained.  Procedure (02/10/2024): Bilateral ear microscopy using microscope (CPT [id number]) Pre-procedure diagnosis: left ear canal lesion Post-procedure diagnosis: same Indication: see above; given patient's otologic complaints and history, for improved and comprehensive examination of external ear and tympanic membrane, bilateral otologic examination using microscope was performed. Prior to proceeding, verbal consent was obtained after discussion of R/B/A  Procedure: Patient was placed semi-recumbent. Both ear canals were examined using the microscope with findings above. Patient tolerated the procedure well.   Impression & Plans:  Danon Lograsso is a 83 y.o. male with   1. Nodule of ear canal, left   2. Xerostomia     Assessment and Plan Assessment & Plan   Left ear canal nodule  Persistent small lesion in the left external ear canal, currently less symptomatic and reduced in size. No evidence of infection or obstruction. Prior drainage and hearing aid adjustment have improved symptoms. Lesion is not concerning at this time.  Surgical excision may require a healing period and temporary discontinuation of hearing aid use. If simple excision is insufficient, more extensive intervention with possible skin grafting may be necessary. Other option of observation since it is not bothering him at this time. Opted for observation.  - Advised continued use of hearing aid as tolerated. - Recommended observation; no immediate intervention planned. - Instructed him to contact clinic if lesion becomes painful or symptomatic for possible in-clinic excision. - Discussed potential need for more extensive procedure with skin grafting if simple excision is inadequate.  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 Low dose preferred due to age.  - Sent prescription for pilocarpine  for thirty days with two refills. - Advised continued use of pilocarpine  as long as effective and well tolerated  See below regarding exact medications prescribed this encounter including dosages and route: Meds ordered this encounter  Medications   pilocarpine  (SALAGEN ) 5 MG tablet    Sig: Take 1 tablet (5 mg total) by mouth 2 (two) times daily.    Dispense:  60 tablet    Refill:  2    Thank you for allowing me the opportunity to care for your patient. Please do not hesitate to contact me should you have any other questions.  Sincerely, Hadassah Parody, MD Otolaryngologist (ENT), [facility] Phone: [phone] Fax: [phone]  MDM:  Level 4 Complexity/Problems addressed: 4- two chronic problems Data complexity: low independent review of none - Morbidity: 4 - drug management   - Prescription Drug prescribed or managed: yes

## 2024-02-25 ENCOUNTER — Other Ambulatory Visit: Payer: Self-pay | Admitting: Internal Medicine

## 2024-02-25 DIAGNOSIS — E781 Pure hyperglyceridemia: Secondary | ICD-10-CM

## 2024-02-27 ENCOUNTER — Encounter: Payer: Self-pay | Admitting: *Deleted

## 2024-02-29 ENCOUNTER — Encounter: Payer: Self-pay | Admitting: Internal Medicine

## 2024-02-29 ENCOUNTER — Ambulatory Visit: Admitting: Internal Medicine

## 2024-02-29 VITALS — BP 118/60 | HR 76 | Ht 65.0 in | Wt 191.0 lb

## 2024-02-29 DIAGNOSIS — I480 Paroxysmal atrial fibrillation: Secondary | ICD-10-CM | POA: Diagnosis not present

## 2024-02-29 DIAGNOSIS — C649 Malignant neoplasm of unspecified kidney, except renal pelvis: Secondary | ICD-10-CM

## 2024-02-29 DIAGNOSIS — I495 Sick sinus syndrome: Secondary | ICD-10-CM

## 2024-02-29 DIAGNOSIS — Z950 Presence of cardiac pacemaker: Secondary | ICD-10-CM | POA: Diagnosis not present
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
## 2024-02-29 NOTE — Patient Instructions (Addendum)
 Medication Instructions:   No changes  *If you need a refill on your cardiac medications before your next appointment, please call your pharmacy*   Lab Work: Not needed  Testing/Procedures: Not needed   Follow-Up: At [facility], you and your health needs are our priority.  As part of our continuing mission to provide you with exceptional heart care, we have created designated Provider Care Teams.  These Care Teams include your primary Cardiologist (physician) and Advanced Practice Providers (APPs -  Physician Assistants and Nurse Practitioners) who all work together to provide you with the care you need, when you need it.     Your next appointment:   12 month(s)  The format for your next appointment:   In Person  Provider:   Emeline FORBES Calender, DO   Other Instructions

## 2024-03-02 ENCOUNTER — Telehealth: Payer: Self-pay

## 2024-03-02 ENCOUNTER — Other Ambulatory Visit (HOSPITAL_COMMUNITY): Payer: Self-pay

## 2024-03-02 NOTE — Telephone Encounter (Signed)
 Prolia  VOB initiated via [email or web address]  Next Prolia  inj DUE: 03/03/24   JUBBONTI PREFERRED FOR PHARMACY COPAY: $827.67

## 2024-03-06 NOTE — Telephone Encounter (Signed)
 SABRA

## 2024-03-06 NOTE — Telephone Encounter (Signed)
 Frank [name]

## 2024-03-06 NOTE — Telephone Encounter (Signed)
 Pt ready for scheduling for PROLIA  on or after : 03/06/24  Option# 1: Buy/Bill (Office supplied medication)  Out-of-pocket cost due at time of clinic visit: $377  Number of injection/visits approved: 2  Primary: [employer]HC-MEDICARE Prolia  co-insurance: 20% Admin fee co-insurance: $25  Secondary: --- Prolia  co-insurance:  Admin fee co-insurance:   Medical Benefit Details: Date Benefits were checked: 03/05/24 Deductible: NO/ Coinsurance: 20%/ Admin Fee: $25  Prior Auth: APPROVED PA# [id number] Expiration Date: 08/24/23-08/23/24  # of doses approved: 2 ----------------------------------------------------------------------- Option# 2- Med Obtained from pharmacy: [facility]   Pharmacy benefit: Copay $827.67 (Paid to pharmacy) Admin Fee: $25 (Pay at clinic)  Prior Auth: N/A PA# Expiration Date:   # of doses approved:   If patient wants fill through the pharmacy benefit please send prescription to: [facility], and include estimated need by date in rx notes. Pharmacy will ship medication directly to the office.  Patient NOT eligible for Prolia  Copay Card. Copay Card can make patient's cost as little as $25. Link to apply: [email or web address]://[email or web address]  ** This summary of benefits is an estimation of the patient's out-of-pocket cost. Exact cost may very based on individual plan coverage.

## 2024-03-14 NOTE — Telephone Encounter (Signed)
 Patient is cleared for PROLIA  injection AFTER 03/06/24 per PA information on 03/02/2024.  Mentioned in provider note last on 12/20/2024. Medication is CLINIC supplied. CoPay:$377

## 2024-03-16 ENCOUNTER — Ambulatory Visit

## 2024-03-16 DIAGNOSIS — M810 Age-related osteoporosis without current pathological fracture: Secondary | ICD-10-CM

## 2024-03-16 MED ORDER — DENOSUMAB 60 MG/ML ~~LOC~~ SOSY: 60.0000 mg | PREFILLED_SYRINGE | Freq: Once | SUBCUTANEOUS | Status: AC

## 2024-03-16 NOTE — Progress Notes (Signed)
 Pt received there prolia  today without any complications. Please co sign as MD is out of office today.

## 2024-03-26 ENCOUNTER — Other Ambulatory Visit: Payer: Self-pay | Admitting: Student

## 2024-03-26 DIAGNOSIS — I493 Ventricular premature depolarization: Secondary | ICD-10-CM

## 2024-04-11 ENCOUNTER — Ambulatory Visit

## 2024-05-11 ENCOUNTER — Ambulatory Visit: Admitting: Cardiology

## 2024-06-25 ENCOUNTER — Inpatient Hospital Stay

## 2024-07-03 ENCOUNTER — Inpatient Hospital Stay: Admitting: Internal Medicine

## 2024-07-11 ENCOUNTER — Ambulatory Visit

## 2024-07-31 ENCOUNTER — Ambulatory Visit

## 2024-10-10 ENCOUNTER — Ambulatory Visit

## 2025-01-09 ENCOUNTER — Ambulatory Visit

## 2025-04-10 ENCOUNTER — Ambulatory Visit
